# Patient Record
Sex: Female | Born: 1970
Health system: Southern US, Community
[De-identification: ages and names within clinical notes are randomized; demographics above are authoritative.]

## PROBLEM LIST (undated history)

## (undated) DIAGNOSIS — Z973 Presence of spectacles and contact lenses: Secondary | ICD-10-CM

## (undated) DIAGNOSIS — F329 Major depressive disorder, single episode, unspecified: Secondary | ICD-10-CM

## (undated) DIAGNOSIS — R7303 Prediabetes: Secondary | ICD-10-CM

## (undated) DIAGNOSIS — Q909 Down syndrome, unspecified: Secondary | ICD-10-CM

## (undated) DIAGNOSIS — R569 Unspecified convulsions: Secondary | ICD-10-CM

## (undated) DIAGNOSIS — L309 Dermatitis, unspecified: Secondary | ICD-10-CM

## (undated) DIAGNOSIS — E039 Hypothyroidism, unspecified: Secondary | ICD-10-CM

## (undated) DIAGNOSIS — I509 Heart failure, unspecified: Secondary | ICD-10-CM

## (undated) DIAGNOSIS — J869 Pyothorax without fistula: Secondary | ICD-10-CM

## (undated) DIAGNOSIS — M1A0690 Idiopathic chronic gout, unspecified knee, without tophus (tophi): Secondary | ICD-10-CM

## (undated) DIAGNOSIS — F0280 Dementia in other diseases classified elsewhere without behavioral disturbance: Secondary | ICD-10-CM

## (undated) DIAGNOSIS — F429 Obsessive-compulsive disorder, unspecified: Secondary | ICD-10-CM

## (undated) DIAGNOSIS — M1990 Unspecified osteoarthritis, unspecified site: Secondary | ICD-10-CM

## (undated) DIAGNOSIS — J189 Pneumonia, unspecified organism: Secondary | ICD-10-CM

## (undated) DIAGNOSIS — T148XXA Other injury of unspecified body region, initial encounter: Secondary | ICD-10-CM

## (undated) DIAGNOSIS — I8393 Asymptomatic varicose veins of bilateral lower extremities: Secondary | ICD-10-CM

## (undated) DIAGNOSIS — F0390 Unspecified dementia without behavioral disturbance: Secondary | ICD-10-CM

## (undated) DIAGNOSIS — H353190 Nonexudative age-related macular degeneration, unspecified eye, stage unspecified: Secondary | ICD-10-CM

## (undated) DIAGNOSIS — E119 Type 2 diabetes mellitus without complications: Secondary | ICD-10-CM

## (undated) DIAGNOSIS — M179 Osteoarthritis of knee, unspecified: Secondary | ICD-10-CM

## (undated) DIAGNOSIS — R600 Localized edema: Secondary | ICD-10-CM

## (undated) DIAGNOSIS — G4736 Sleep related hypoventilation in conditions classified elsewhere: Secondary | ICD-10-CM

## (undated) DIAGNOSIS — F32A Depression, unspecified: Secondary | ICD-10-CM

## (undated) DIAGNOSIS — N8500 Endometrial hyperplasia, unspecified: Secondary | ICD-10-CM

## (undated) DIAGNOSIS — G4733 Obstructive sleep apnea (adult) (pediatric): Secondary | ICD-10-CM

## (undated) DIAGNOSIS — Z9981 Dependence on supplemental oxygen: Secondary | ICD-10-CM

## (undated) DIAGNOSIS — Z8701 Personal history of pneumonia (recurrent): Secondary | ICD-10-CM

## (undated) DIAGNOSIS — M1710 Unilateral primary osteoarthritis, unspecified knee: Secondary | ICD-10-CM

## (undated) DIAGNOSIS — Z9989 Dependence on other enabling machines and devices: Secondary | ICD-10-CM

## (undated) HISTORY — DX: Sleep related hypoventilation in conditions classified elsewhere: G47.36

## (undated) HISTORY — PX: BACK SURGERY: SHX140

## (undated) HISTORY — DX: Osteoarthritis of knee, unspecified: M17.9

## (undated) HISTORY — DX: Hypothyroidism, unspecified: E03.9

## (undated) HISTORY — DX: Dermatitis, unspecified: L30.9

## (undated) HISTORY — PX: LUMBAR DISC SURGERY: SHX700

## (undated) HISTORY — DX: Nonexudative age-related macular degeneration, unspecified eye, stage unspecified: H35.3190

## (undated) HISTORY — PX: ANTERIOR CRUCIATE LIGAMENT REPAIR: SHX115

## (undated) HISTORY — DX: Dependence on other enabling machines and devices: Z99.89

## (undated) HISTORY — DX: Unilateral primary osteoarthritis, unspecified knee: M17.10

## (undated) HISTORY — DX: Dementia in other diseases classified elsewhere, unspecified severity, without behavioral disturbance, psychotic disturbance, mood disturbance, and anxiety: F02.80

## (undated) HISTORY — DX: Obstructive sleep apnea (adult) (pediatric): G47.33

## (undated) HISTORY — DX: Unspecified convulsions: R56.9

## (undated) HISTORY — DX: Major depressive disorder, single episode, unspecified: F32.9

## (undated) HISTORY — DX: Heart failure, unspecified: I50.9

## (undated) HISTORY — DX: Unspecified dementia, unspecified severity, without behavioral disturbance, psychotic disturbance, mood disturbance, and anxiety: F03.90

## (undated) HISTORY — DX: Depression, unspecified: F32.A

---

## 2000-10-22 ENCOUNTER — Encounter: Payer: Self-pay | Admitting: Emergency Medicine

## 2000-10-22 ENCOUNTER — Emergency Department (HOSPITAL_COMMUNITY): Admission: EM | Admit: 2000-10-22 | Discharge: 2000-10-22 | Payer: Self-pay | Admitting: Emergency Medicine

## 2000-11-09 ENCOUNTER — Encounter: Admission: RE | Admit: 2000-11-09 | Discharge: 2000-12-03 | Payer: Self-pay | Admitting: Family Medicine

## 2000-12-07 ENCOUNTER — Encounter: Payer: Self-pay | Admitting: Family Medicine

## 2000-12-07 ENCOUNTER — Encounter: Admission: RE | Admit: 2000-12-07 | Discharge: 2000-12-07 | Payer: Self-pay | Admitting: Family Medicine

## 2001-07-04 ENCOUNTER — Encounter: Admission: RE | Admit: 2001-07-04 | Discharge: 2001-07-04 | Payer: Self-pay | Admitting: Family Medicine

## 2001-07-04 ENCOUNTER — Encounter: Payer: Self-pay | Admitting: Family Medicine

## 2001-11-28 ENCOUNTER — Encounter
Admission: RE | Admit: 2001-11-28 | Discharge: 2001-12-06 | Payer: Self-pay | Admitting: Physical Medicine & Rehabilitation

## 2004-06-01 ENCOUNTER — Encounter: Admission: RE | Admit: 2004-06-01 | Discharge: 2004-06-01 | Payer: Self-pay | Admitting: Neurosurgery

## 2004-07-18 ENCOUNTER — Ambulatory Visit (HOSPITAL_COMMUNITY): Admission: RE | Admit: 2004-07-18 | Discharge: 2004-07-19 | Payer: Self-pay | Admitting: Neurosurgery

## 2004-07-18 HISTORY — PX: LUMBAR DISC SURGERY: SHX700

## 2005-05-09 ENCOUNTER — Encounter: Admission: RE | Admit: 2005-05-09 | Discharge: 2005-05-09 | Payer: Self-pay | Admitting: Neurosurgery

## 2005-06-14 ENCOUNTER — Encounter: Admission: RE | Admit: 2005-06-14 | Discharge: 2005-06-14 | Payer: Self-pay | Admitting: Neurology

## 2005-07-04 ENCOUNTER — Emergency Department (HOSPITAL_COMMUNITY): Admission: EM | Admit: 2005-07-04 | Discharge: 2005-07-05 | Payer: Self-pay | Admitting: Emergency Medicine

## 2008-03-16 ENCOUNTER — Observation Stay (HOSPITAL_COMMUNITY): Admission: AD | Admit: 2008-03-16 | Discharge: 2008-03-17 | Payer: Self-pay | Admitting: Family Medicine

## 2008-03-16 ENCOUNTER — Ambulatory Visit: Payer: Self-pay | Admitting: Family Medicine

## 2009-01-05 ENCOUNTER — Encounter: Admission: RE | Admit: 2009-01-05 | Discharge: 2009-01-05 | Payer: Self-pay | Admitting: Neurology

## 2010-01-13 ENCOUNTER — Emergency Department (HOSPITAL_COMMUNITY)
Admission: EM | Admit: 2010-01-13 | Discharge: 2010-01-13 | Payer: Self-pay | Source: Home / Self Care | Admitting: Emergency Medicine

## 2010-05-10 LAB — URINALYSIS, ROUTINE W REFLEX MICROSCOPIC
Glucose, UA: NEGATIVE mg/dL
Protein, ur: NEGATIVE mg/dL
pH: 6 (ref 5.0–8.0)

## 2010-05-10 LAB — DIFFERENTIAL
Eosinophils Absolute: 0.1 10*3/uL (ref 0.0–0.7)
Eosinophils Relative: 1 % (ref 0–5)
Lymphocytes Relative: 16 % (ref 12–46)

## 2010-05-10 LAB — COMPREHENSIVE METABOLIC PANEL
ALT: 24 U/L (ref 0–35)
Alkaline Phosphatase: 50 U/L (ref 39–117)
Chloride: 103 mEq/L (ref 96–112)
GFR calc Af Amer: >60 mL/min (ref >60)
Glucose, Bld: 106 mg/dL — ABNORMAL HIGH (ref 70–99)
Potassium: 4.1 mEq/L (ref 3.5–5.1)
Sodium: 138 mEq/L (ref 135–145)

## 2010-05-10 LAB — CBC
MCH: 35.4 pg — ABNORMAL HIGH (ref 26.0–34.0)
MCV: 103.1 fL — ABNORMAL HIGH (ref 78.0–100.0)
Platelets: 222 10*3/uL (ref 150–400)
RBC: 4.24 MIL/uL (ref 3.87–5.11)

## 2010-06-13 LAB — COMPREHENSIVE METABOLIC PANEL
AST: 32 U/L (ref 0–37)
Alkaline Phosphatase: 53 U/L (ref 39–117)
Calcium: 8.4 mg/dL (ref 8.4–10.5)
GFR calc Af Amer: >60 mL/min (ref >60)
GFR calc non Af Amer: 60 mL/min — ABNORMAL LOW (ref >60)
Glucose, Bld: 145 mg/dL — ABNORMAL HIGH (ref 70–99)
Potassium: 3.6 mEq/L (ref 3.5–5.1)
Total Bilirubin: 0.6 mg/dL (ref 0.3–1.2)
Total Protein: 7.3 g/dL (ref 6.0–8.3)

## 2010-06-13 LAB — DIFFERENTIAL
Basophils Absolute: 0 10*3/uL (ref 0.0–0.1)
Eosinophils Relative: 0 % (ref 0–5)
Lymphocytes Relative: 10 % — ABNORMAL LOW (ref 12–46)
Lymphs Abs: 0.5 10*3/uL — ABNORMAL LOW (ref 0.7–4.0)
Monocytes Relative: 2 % — ABNORMAL LOW (ref 3–12)
Neutro Abs: 4.9 10*3/uL (ref 1.7–7.7)
Neutrophils Relative %: 89 % — ABNORMAL HIGH (ref 43–77)

## 2010-06-13 LAB — CBC
HCT: 37.9 % (ref 36.0–46.0)
HCT: 41.6 % (ref 36.0–46.0)
Hemoglobin: 13.6 g/dL (ref 12.0–15.0)
MCHC: 33 g/dL (ref 30.0–36.0)
Platelets: 199 10*3/uL (ref 150–400)
RBC: 3.7 MIL/uL — ABNORMAL LOW (ref 3.87–5.11)
WBC: 5.4 10*3/uL (ref 4.0–10.5)

## 2010-06-13 LAB — D-DIMER, QUANTITATIVE: D-Dimer, Quant: 0.53 ug/mL-FEU — ABNORMAL HIGH (ref 0.00–0.48)

## 2010-06-13 LAB — BRAIN NATRIURETIC PEPTIDE: Pro B Natriuretic peptide (BNP): 26 pg/mL (ref 0.0–100.0)

## 2010-06-13 LAB — BASIC METABOLIC PANEL
BUN: 10 mg/dL (ref 6–23)
Chloride: 105 mEq/L (ref 96–112)
Potassium: 3.6 mEq/L (ref 3.5–5.1)

## 2010-07-12 NOTE — Discharge Summary (Signed)
NAMECHARIZMA, [name]                 ACCOUNT NO.:  [id number]   MEDICAL RECORD NO.:  [id number]          PATIENT TYPE:  OBV   LOCATION:  3703                         FACILITY:  [facility]   PHYSICIAN:  Santiago Bumpers. Hensel, M.D.DATE OF BIRTH:  10/19/1970   DATE OF ADMISSION:  03/16/2008  DATE OF DISCHARGE:  03/17/2008                               DISCHARGE SUMMARY   DISCHARGE DIAGNOSES:  1. Bronchitis with wheezing.  2. Hyponatremia.  3. Obsessive-compulsive disorder.  4. Hypothyroidism.  5. Trisomy 21.   DISCHARGE MEDICATIONS:  1. Synthroid 125 mcg daily.  2. Prozac 20 mg 1 tablet p.o. 3 times daily.  3. Prednisone 60 mg 1 tablet daily x3 more days, the last dose should      be on March 20, 2008.  4. Albuterol 2 puffs every 4 hours as needed for shortness of breath      or may also use nebulized albuterol q.4 h. p.r.n. shortness of      breath.  5. Azithromycin 250 mg 1 tablet daily for 3 more days, the last dose      should be on March 20, 2008.   FOLLOWUP APPOINTMENTS:  The patient has an appointment with Dr. Purnell Shoemaker.  Doctor's phone number is [phone].  The appointment date is Monday,  March 23, 2008, at 11 o'clock a.m.   LABORATORY DATA:  Done in the hospital on admission, the patient had a  normal white blood cell count of 5.6.  Her hemoglobin was 13.6.  Her  hematocrit was 41.6.  She did have an elevated MCV of 102.9.  Her  neutrophil percentage was 89%.  Her D-dimer was 0.53 and mildly  elevated.  She also had a complete metabolic panel on admission that was  significant for a low sodium of 125, low chloride of 95, glucose of 145  that was after she ate and a creatinine of 1.04.  Her LFTs were within  normal limits.  Her albumin was slightly low at 3.4.  Her BNP was 26.  On day of discharge, her CBC showed a normal white blood cell count  again at 5.4, hemoglobin was 12.5.  Her hematocrit 37.9.  Her platelets  were 199.  On Sunday at discharge, the patient's sodium  corrected to  138.  Her potassium was 3.6.  Her chloride was 105.  Her bicarb was 25.  Her glucose was 101.  Her BUN was 10, and her creatinine 0.89.  Her  calcium was 8.3.   OTHER STUDIES:  The patient had in the hospital a chest x-ray on March 16, 2008, showed streaky bibasilar opacities, likely representing  subsegmental atelectasis and less likely infiltrates.  Also on the chest  x-ray, there was mild peribronchial cuffing that could represent  bronchitis or bronchiolitis.  A chest CT done on March 17, 2008,  showed no evidence of pulmonary embolism, a few minimally prominent  nonspecific right paratracheal nodes, minimal bilateral lower lobe  atelectasis, cannot exclude minimal ground-glass infiltrates in the  lower lungs, nonspecific finding.   REASON FOR ADMISSION:  The patient was admitted from [place]  [facility]  as a direct admission due to increased shortness of breath and an oxygen  saturation that was found to be lower on the day of admission than it  was prior to that on the day before that.  Please see history and  physical for her whole admission H&P.  The patient was thought to have  pneumonia at the Urgent [facility] Center and was given a shot of ceftriaxone  as well as started on steroids and azithromycin at that time.   HOSPITAL COURSE:  1. Shortness of breath/wheezing:  This was initially thought to be due      to pneumonia that was diagnosed at [facility] on March 15, 2008.  The patient had received 1 g of ceftriaxone at Urgent      Care and followed up the next day on March 16, 2008, and was felt      to be worsening, so she was directly admitted and placed in      observation at our hospital.  Upon admission, the patient was doing      really well.  She never required any oxygen.  She continued sat 95-      97% on room air during her entire stay.  She was put on albuterol      nebs q.4 h. as she does have Down syndrome and is unable to ask  for      the treatments well.  We also continued the patient on the po      prednisone that she was getting at home (60 mg daily).  She got her      first dose of prednisone on March 16, 2008, and we gave her      another dose on March 17, 2008.  She will continue this for a 5-      day course.  A D-dimer was checked and found to be slightly      elevated, so a CT angiogram was done but ruled out a PE.  CXR      showed viral like findings and likely bronchitis and no PNA.  We      also continued her on azithromycin that she was started on at the      urgent care center. She will continue to complete a 5-day course of      azithromycin to treat her bronchitis.  2. Hyponatremia:  The patient's sodium was initially low at 125 and      corrected with 1 liter bolus of normal saline, as well as a 120 mL      an hour of D5 normal saline during her stay.  She was also eating      and drinking well normally at the time of discharge.  Her last      sodium was 138 prior to discharge.  3. Obsessive-compulsive disorder:  The patient has been on Prozac 20      mg 3 times a day for a very long time according to her mother.  We      continued her on this medication.  She remained stable in regards      to this problem.  4. Hypothyroidism:  The patient was continued on her home dose of      Synthroid.   DISCHARGE CONDITION:  The patient is stable.  She is breathing well on  room air, not requiring oxygen.  Her lungs sounded much clearer than  they  did on admission.  She will go home with her father to her house  where she lives with her parents.  She is afebrile and did not have any  fevers here      Alanda Amass, M.D.  Electronically Signed      Santiago Bumpers. Leveda Anna, M.D.  Electronically Signed    JH/MEDQ  D:  03/17/2008  T:  03/18/2008  Job:  [id number]

## 2010-07-12 NOTE — H&P (Signed)
[name], [name]                 ACCOUNT NO.:  [id number]   MEDICAL RECORD NO.:  [id number]          PATIENT TYPE:  OBV   LOCATION:  3703                         FACILITY:  [facility]   PHYSICIAN:  Santiago Bumpers. Hensel, M.D.DATE OF BIRTH:  [birth year]   DATE OF ADMISSION:  03/16/2008  DATE OF DISCHARGE:                              HISTORY & PHYSICAL   PRIMARY CARE PHYSICIAN:  Lianne Bushy, MD, however, she was seen by Dr.  Cleta Alberts 2 times at [facility].   CHIEF COMPLAINT:  Shortness of breath.   HISTORY OF PRESENT ILLNESS:  The patient is a 40 year old female with  trisomy 88, relatively high functioning, who was here after being seen  at [facility] by Dr. Cleta Alberts today.  The patient was recovering  down with cold/cough last Thursday which was approximately 5 days ago,  she became more short of breath and was seen on March 15, 2008, which  was 2 days ago at [place].  At this time, she was sating 99% on room air.  She was diagnosed by chest x-ray with bilateral lower lobe pneumonia.  She was given 1 g of Rocephin at that time as well as prescription for  azithromycin 250 mg x5 days and prednisone 60 mg x2 days and 40 mg x4  days and then 20 mg x6 days.  The patient did take 60 mg of prednisone  today.  However, she is not started on her antibiotics yet.  She  received her ceftriaxone shot yesterday evening around 5 or 6 p.m.  The  patient was also started on albuterol nebs at the Urgent Care Center and  has a nebulizer machine as well as a neb solution at home.  She was  followed up the following day by [place] on March 16, 2008, which was  today around noon.  Her oxygen saturation was 90% and Dr. Cleta Alberts called [place]  to admit the patient to make sure nothing else was going causing her low  O2 saturation.  Currently, the patient is sating 97% on room air in the  hospital bed.  Mom says that she would like to send the patient home if  she is only needing albuterol nebs and not  needing oxygen.  According to  mom, the patient looks better than she did yesterday.  She is more  perky.  She has not had neb treatments since approximately 12 p.m. and  she feels like she would like to have one now.  The patient is also  complaining of some chest pain when she breathes and when she coughs,  but otherwise doing well.  The patient says that she did have some fever  before.   PAST MEDICAL HISTORY:  1. Hypothyroidism.  2. Depression versus obsessive-compulsive disorder.  The mom says that      this is more of obsessive-compulsive disorder.  No history of      cardiac problems.  No history of wheezing.  No history of an      echocardiogram in the past.   ALLERGIES:  No known drug allergies.  MEDICATIONS:  1. Synthroid 0.125 mcg daily.  2. Prozac 20 mg 3 times a day.   PAST SURGICAL HISTORY:  She has had a couple of knee surgeries that were  arthroscopic, also surgery on ruptured disk in her back in 2004.   FAMILY HISTORY:  Grandmother had diabetes.  Mom is healthy.  Dad has  hypertension and diabetes.   SOCIAL HISTORY:  She lives with mom, Burnard Leigh.  No history of alcohol  smoking or drugs.  She goes to adult day care during the day.   REVIEW OF SYSTEMS:  The patient is also complaining of diarrhea x2, but  mom says she did not have this.  Also, the patient denies dizziness.  She says she is hungry, tolerating p.o. liquids well, wants a diet.  Also complaining of some mild abdominal pain in the bottom of her  abdomen.   PHYSICAL EXAMINATION:  VITAL SIGNS:  Temperature 97.4, heart rate 85,  respiratory rate 18, blood pressure 103/63, and sating 97% on room air.  GENERAL:  The patient is alert, in no acute distress, smiling, talking  in complete sentences, at times interrupted by coughing, also Down  syndrome appearance.  HEENT:  Oropharynx, no exudates or erythema.  Extraocular muscles are  intact.  Pupils are equal, round, and reactive to light.  NECK:  No  lymphadenopathy.  CARDIOVASCULAR:  Heart, regular rate and rhythm.  No murmurs, rubs, or  gallops.  +2 radial pulses.  No JVD noted.  PULMONARY:  Expiratory crackles bilaterally, worse at the bases.  No  retractions.  No nasal flaring.  Normal respiratory rate.  ABDOMEN:  Soft, nondistended, and nontender.  Normoactive bowel sounds.  No hepatosplenomegaly.  EXTREMITIES: Very slight trace edema of the knee bilaterally.   Chest x-ray at [facility] showed bibasilar infiltrate on March 15, 2008,  and influenza was negative at Urgent Care   ASSESSMENT AND PLAN:  The patient is a 40 year old female with trisomy  21 and pneumonia, in very good condition.  1. Shortness of breath.  This is likely secondary to her pneumonia as      seen on chest x-ray at Urgent Care.  She is sating well on room air      at 97%, actually doing better than she was doing at [place]      according to Dr. Cleta Alberts described to me.  Also, she is doing better      per mom from yesterday.  Mom desires that she go home today if      doing well.  We will go ahead and repeat a chest x-ray, AP and      lateral.  We will also check a CBC and differential, CMET, BNP, and      D-dimer just to rule out any other possible causes, like PE.  Chest      pain is pleuritic and the patient has pneumonia on chest x-ray      previously diagnosed, so we will not cycle cardiac enzymes since it      seems likely have an explanation of this.  We will give another      albuterol neb now and continue q.4 h. p.r.n. nebs.  We will go      ahead and give her next dose of antibiotics, give her azithromycin      500 mg p.o. x1 now as it is now 24 hours after her ceftriaxone      shot.  If her labs are normal and  her chest x-ray just shows      pneumonia and she is sating well, we will discharge her home      tonight.  She will continue with her prednisone tomorrow since she      already got the dose today.  2. Obsessive-compulsive disorder.  We will  continue her Prozac at her      home dose of 3 times a day, 20 mg.  3. Hypothyroidism.  We will continue her Synthroid at her home dose.  4. Fluids, electrolytes, nutrition/gastrointestinal.  We will start      her on a regular diet.  5. Prophylaxis.  Heparin while in the hospital.      Alanda Amass, M.D.  Electronically Signed      Santiago Bumpers. Leveda Anna, M.D.  Electronically Signed    JH/MEDQ  D:  03/16/2008  T:  03/17/2008  Job:  [id number]
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
## 2010-12-26 ENCOUNTER — Emergency Department (HOSPITAL_COMMUNITY): Payer: Medicare Other

## 2010-12-26 ENCOUNTER — Emergency Department (HOSPITAL_COMMUNITY)
Admission: EM | Admit: 2010-12-26 | Discharge: 2010-12-27 | Disposition: A | Discharge status: Home / Self Care | Payer: Medicare Other | Attending: Emergency Medicine | Admitting: Emergency Medicine

## 2010-12-26 DIAGNOSIS — Y9239 Other specified sports and athletic area as the place of occurrence of the external cause: Secondary | ICD-10-CM | POA: Insufficient documentation

## 2010-12-26 DIAGNOSIS — Y9354 Activity, bowling: Secondary | ICD-10-CM | POA: Insufficient documentation

## 2010-12-26 DIAGNOSIS — M79609 Pain in unspecified limb: Secondary | ICD-10-CM | POA: Insufficient documentation

## 2010-12-26 DIAGNOSIS — F068 Other specified mental disorders due to known physiological condition: Secondary | ICD-10-CM | POA: Insufficient documentation

## 2010-12-26 DIAGNOSIS — Q909 Down syndrome, unspecified: Secondary | ICD-10-CM | POA: Insufficient documentation

## 2010-12-26 DIAGNOSIS — M7989 Other specified soft tissue disorders: Secondary | ICD-10-CM | POA: Insufficient documentation

## 2010-12-26 DIAGNOSIS — Z79899 Other long term (current) drug therapy: Secondary | ICD-10-CM | POA: Insufficient documentation

## 2010-12-26 DIAGNOSIS — W230XXA Caught, crushed, jammed, or pinched between moving objects, initial encounter: Secondary | ICD-10-CM | POA: Insufficient documentation

## 2010-12-26 DIAGNOSIS — S6720XA Crushing injury of unspecified hand, initial encounter: Secondary | ICD-10-CM | POA: Insufficient documentation

## 2011-02-06 ENCOUNTER — Encounter (INDEPENDENT_AMBULATORY_CARE_PROVIDER_SITE_OTHER): Payer: Medicare Other | Admitting: Ophthalmology

## 2011-02-06 DIAGNOSIS — H43819 Vitreous degeneration, unspecified eye: Secondary | ICD-10-CM

## 2011-02-06 DIAGNOSIS — H3090 Unspecified chorioretinal inflammation, unspecified eye: Secondary | ICD-10-CM

## 2011-02-06 DIAGNOSIS — H2510 Age-related nuclear cataract, unspecified eye: Secondary | ICD-10-CM

## 2011-02-28 DIAGNOSIS — G40909 Epilepsy, unspecified, not intractable, without status epilepticus: Secondary | ICD-10-CM

## 2011-02-28 DIAGNOSIS — R569 Unspecified convulsions: Secondary | ICD-10-CM

## 2011-02-28 HISTORY — DX: Epilepsy, unspecified, not intractable, without status epilepticus: G40.909

## 2011-02-28 HISTORY — DX: Unspecified convulsions: R56.9

## 2011-03-07 ENCOUNTER — Encounter (INDEPENDENT_AMBULATORY_CARE_PROVIDER_SITE_OTHER): Payer: Medicare Other | Admitting: Ophthalmology

## 2011-03-07 DIAGNOSIS — H43819 Vitreous degeneration, unspecified eye: Secondary | ICD-10-CM

## 2011-03-07 DIAGNOSIS — H3090 Unspecified chorioretinal inflammation, unspecified eye: Secondary | ICD-10-CM

## 2011-03-07 DIAGNOSIS — H35359 Cystoid macular degeneration, unspecified eye: Secondary | ICD-10-CM

## 2011-04-07 ENCOUNTER — Encounter (INDEPENDENT_AMBULATORY_CARE_PROVIDER_SITE_OTHER): Payer: Medicare Other | Admitting: Ophthalmology

## 2011-04-07 DIAGNOSIS — H3020 Posterior cyclitis, unspecified eye: Secondary | ICD-10-CM

## 2011-04-07 DIAGNOSIS — H35379 Puckering of macula, unspecified eye: Secondary | ICD-10-CM

## 2011-04-07 DIAGNOSIS — H35359 Cystoid macular degeneration, unspecified eye: Secondary | ICD-10-CM

## 2011-04-07 DIAGNOSIS — H2510 Age-related nuclear cataract, unspecified eye: Secondary | ICD-10-CM

## 2011-04-07 DIAGNOSIS — H43819 Vitreous degeneration, unspecified eye: Secondary | ICD-10-CM

## 2011-05-08 ENCOUNTER — Ambulatory Visit (INDEPENDENT_AMBULATORY_CARE_PROVIDER_SITE_OTHER): Payer: Medicare Other | Admitting: Ophthalmology

## 2011-05-29 ENCOUNTER — Emergency Department (HOSPITAL_COMMUNITY): Payer: Medicare Other

## 2011-05-29 ENCOUNTER — Encounter (HOSPITAL_COMMUNITY): Payer: Self-pay | Admitting: Emergency Medicine

## 2011-05-29 ENCOUNTER — Emergency Department (HOSPITAL_COMMUNITY)
Admission: EM | Admit: 2011-05-29 | Discharge: 2011-05-29 | Disposition: A | Discharge status: Home / Self Care | Payer: Medicare Other | Attending: Emergency Medicine | Admitting: Emergency Medicine

## 2011-05-29 DIAGNOSIS — M2540 Effusion, unspecified joint: Secondary | ICD-10-CM

## 2011-05-29 DIAGNOSIS — M25569 Pain in unspecified knee: Secondary | ICD-10-CM | POA: Insufficient documentation

## 2011-05-29 DIAGNOSIS — J45909 Unspecified asthma, uncomplicated: Secondary | ICD-10-CM | POA: Insufficient documentation

## 2011-05-29 DIAGNOSIS — M7989 Other specified soft tissue disorders: Secondary | ICD-10-CM | POA: Insufficient documentation

## 2011-05-29 MED ORDER — ACETAMINOPHEN 325 MG PO TABS
325.0000 mg | ORAL_TABLET | Freq: Once | ORAL | Status: AC
  Administered 2011-05-29: 325 mg via ORAL
  Filled 2011-05-29: qty 1

## 2011-05-29 MED ORDER — OXYCODONE-ACETAMINOPHEN 5-325 MG PO TABS
1.0000 | ORAL_TABLET | Freq: Once | ORAL | Status: AC
  Administered 2011-05-29: 1 via ORAL
  Filled 2011-05-29: qty 1

## 2011-05-29 NOTE — ED Notes (Signed)
Family member stated that pt has been having rt knee pain. Pt has chronic arthritis. Pt normally gets lubricant shots and they seem to work. However, they have not been working for the last couple of days. Pain is 7 out of 10. Pt unable to ambulate without pain. Will continue to monitor.

## 2011-05-29 NOTE — ED Notes (Signed)
Family stated they are not waiting for anyone and they are leaving now. EDP made aware. Pt and family made aware that they are leaving at their own risk.

## 2011-05-29 NOTE — Discharge Instructions (Signed)
Joint Injection Care After Refer to this sheet in the next few days. These instructions provide you with information on caring for yourself after you have had a joint injection. Your caregiver also may give you more specific instructions. Your treatment has been planned according to current medical practices, but problems sometimes occur. Call your caregiver if you have any problems or questions after your procedure. After any type of joint injection, it is not uncommon to experience:  Soreness, swelling, or bruising around the injection site.   Mild numbness, tingling, or weakness around the injection site caused by the numbing medicine used before or with the injection.  It also is possible to experience the following effects associated with the specific agent after injection:  Iodine-based contrast agents:   Allergic reaction (itching, hives, widespread redness, and swelling beyond the injection site).   Corticosteroids (These effects are rare.):   Allergic reaction.   Increased blood sugar levels (If you have diabetes and you notice that your blood sugar levels have increased, notify your caregiver).   Increased blood pressure levels.   Mood swings.   Hyaluronic acid in the use of viscosupplementation.   Temporary heat or redness.   Temporary rash and itching.   Increased fluid accumulation in the injected joint.  These effects all should resolve within a day after your procedure.  HOME CARE INSTRUCTIONS  Limit yourself to light activity the day of your procedure. Avoid lifting heavy objects, bending, stooping, or twisting.   Take prescription or over-the-counter pain medication as directed by your caregiver.   You may apply ice to your injection site to reduce pain and swelling the day of your procedure. Ice may be applied 3 to 4 times:   Put ice in a plastic bag.   Place a towel between your skin and the bag.   Leave the ice on for no longer than 15 to 20 minutes  each time.  SEEK IMMEDIATE MEDICAL CARE IF:   Pain and swelling get worse rather than better or extend beyond the injection site.   Numbness does not go away.   Blood or fluid continues to leak from the injection site.   You have chest pain.   You have swelling of your face or tongue.   You have trouble breathing or you become dizzy.   You develop a fever, chills, or severe tenderness at the injection site that last longer than 1 day.  MAKE SURE YOU:  Understand these instructions.   Watch your condition.   Get help right away if you are not doing well or if you get worse.  Document Released: 10/27/2010 Document Revised: 02/02/2011 Document Reviewed: 10/27/2010 [employer] Patient Information 2012 [employer]. 

## 2011-05-29 NOTE — ED Notes (Signed)
Ortho made aware of knee immobilizer.

## 2011-05-29 NOTE — ED Notes (Signed)
Pt left AMA °

## 2011-05-29 NOTE — ED Notes (Signed)
PT. REPORTS PERSISTENT RIGHT KNEE PAIN FOR 2 WEEKS DENIES INJURY OR FALL , STATES HISTORY OF ARTHRITIS.

## 2011-05-29 NOTE — ED Notes (Signed)
Pt and family refused knee immobilizer. NP made aware. Pt and family stated they were ready to leave now and disappointed because they were only able to see a NP ( and not Dr). Again, made NP aware.  Will continue to monitor

## 2011-05-29 NOTE — ED Provider Notes (Signed)
History     CSN: [id number]  Arrival date & time 05/29/11  1840   First MD Initiated Contact with Patient 05/29/11 2156      Chief Complaint  Patient presents with  . Knee Pain    (Consider location/radiation/quality/duration/timing/severity/associated sxs/prior treatment) Patient is a 41 y.o. female presenting with knee pain. The history is provided by the patient and a parent. No language interpreter was used.  Knee Pain This is a chronic problem. The current episode started 1 to 4 weeks ago. The problem occurs daily. The problem has been gradually worsening. Associated symptoms include joint swelling. Pertinent negatives include no chest pain, chills, fever, nausea, numbness, vomiting or weakness. The symptoms are aggravated by walking and bending. She has tried NSAIDs for the symptoms. The treatment provided mild relief.  Chronic R knee pain that has gotten worse over the past 2 weeks.    Past Medical History  Diagnosis Date  . Asthma     Past Surgical History  Procedure Date  . Back surgery   . Anterior cruciate ligament repair     No family history on file.  History  Substance Use Topics  . Smoking status: Never Smoker   . Smokeless tobacco: Not on file  . Alcohol Use: No    OB History    Grav Para Term Preterm Abortions TAB SAB Ect Mult Living                  Review of Systems  Constitutional: Negative.  Negative for fever and chills.  Respiratory: Negative for shortness of breath.   Cardiovascular: Negative for chest pain.  Gastrointestinal: Negative for nausea and vomiting.  Musculoskeletal: Positive for joint swelling.  Skin: Negative.   Neurological: Negative for dizziness, weakness and numbness.  Psychiatric/Behavioral: Negative.     Allergies  Review of patient's allergies indicates no known allergies.  Home Medications   Current Outpatient Rx  Name Route Sig Dispense Refill  . BUSPIRONE HCL 15 MG PO TABS Oral Take 15 mg by mouth 2 (two)  times daily.    Marland Kitchen FLUOXETINE HCL 20 MG PO CAPS Oral Take 20 mg by mouth 3 (three) times daily.    . IBUPROFEN 800 MG PO TABS Oral Take 800 mg by mouth every 8 (eight) hours as needed. For pain.    Marland Kitchen LEVOTHYROXINE SODIUM 112 MCG PO TABS Oral Take 112 mcg by mouth daily.    Marland Kitchen MEMANTINE HCL 10 MG PO TABS Oral Take 10 mg by mouth 2 (two) times daily.      BP 121/67  Pulse 85  Temp(Src) 97.9 F (36.6 C) (Oral)  Resp 18  SpO2 99%  LMP 05/14/2011  Physical Exam  Nursing note and vitals reviewed. Constitutional: She is oriented to person, place, and time. She appears well-developed and well-nourished.  HENT:  Head: Normocephalic and atraumatic.  Eyes: Conjunctivae and EOM are normal. Pupils are equal, round, and reactive to light.  Neck: Normal range of motion. Neck supple.  Cardiovascular: Normal rate.   Pulmonary/Chest: Effort normal.  Abdominal: Soft.  Musculoskeletal: She exhibits edema and tenderness.       R knee.  2+ R pedal pulse. Good movement and sensation to RLE  Neurological: She is alert and oriented to person, place, and time. She has normal reflexes.  Skin: Skin is warm and dry.  Psychiatric: She has a normal mood and affect.    ED Course  Procedures (including critical care time)  Labs Reviewed - No data  to display Dg Knee Complete 4 Views Right  05/29/2011  *RADIOLOGY REPORT*  Clinical Data: Pain  RIGHT KNEE - COMPLETE 4+ VIEW  Comparison: None.  Findings: There is a joint effusion.  There is chondrocalcinosis affecting the meniscal cartilage.  Joint spaces appear normal. There are small marginal osteophytes.  The patella appears somewhat laterally subluxed.  No acute or traumatic finding.  IMPRESSION: Joint effusion.  Chondrocalcinosis of the menisci.  Small marginal osteophytes.  Lateral subluxation of the patella, presumably chronic.  Original Report Authenticated By: Thomasenia Sales, M.D.     No diagnosis found.    MDM  R knee effusion and chronic laterally  subluxed patella.  Refused knee immobilizer or rx for pain meds.  Left without dc papers.  Percocet and tylenol for pain in the er.  Will follow up with [place] orth[facility] this week.         Jethro Bastos, NP 05/30/11 1729

## 2011-05-29 NOTE — Progress Notes (Signed)
Orthopedic Tech Progress Note Patient Details:  [name] 12-16-1970 [id number] Patient refused knee immobilizer. Patient ID: [name], female   DOB: [birth year], 41 y.o.   MRN: [id number]   [name] 05/29/2011, 10:20 PM

## 2011-05-31 NOTE — ED Provider Notes (Signed)
Medical screening examination/treatment/procedure(s) were performed by non-physician practitioner and as supervising physician I was immediately available for consultation/collaboration.   Shelda Jakes, MD 05/31/11 [phone]

## 2011-06-28 ENCOUNTER — Encounter (INDEPENDENT_AMBULATORY_CARE_PROVIDER_SITE_OTHER): Payer: Medicare Other | Admitting: Ophthalmology

## 2011-06-28 DIAGNOSIS — H43819 Vitreous degeneration, unspecified eye: Secondary | ICD-10-CM

## 2011-06-28 DIAGNOSIS — H2510 Age-related nuclear cataract, unspecified eye: Secondary | ICD-10-CM

## 2011-06-28 DIAGNOSIS — H35359 Cystoid macular degeneration, unspecified eye: Secondary | ICD-10-CM

## 2011-07-10 ENCOUNTER — Encounter (INDEPENDENT_AMBULATORY_CARE_PROVIDER_SITE_OTHER): Payer: Medicare Other | Admitting: Ophthalmology

## 2011-09-24 ENCOUNTER — Inpatient Hospital Stay (HOSPITAL_COMMUNITY)
Admission: EM | Admit: 2011-09-24 | Discharge: 2011-10-12 | DRG: 003 | Disposition: A | Discharge status: Other Acute Inpatient Hospital | Payer: Medicare Other | Attending: Internal Medicine | Admitting: Internal Medicine

## 2011-09-24 ENCOUNTER — Encounter (HOSPITAL_COMMUNITY): Payer: Self-pay | Admitting: Certified Registered"

## 2011-09-24 ENCOUNTER — Inpatient Hospital Stay (HOSPITAL_COMMUNITY): Payer: Medicare Other | Admitting: Certified Registered"

## 2011-09-24 ENCOUNTER — Emergency Department (HOSPITAL_COMMUNITY): Payer: Medicare Other

## 2011-09-24 ENCOUNTER — Encounter (HOSPITAL_COMMUNITY)
Admission: EM | Disposition: A | Discharge status: Nursing Home (Includes ICF) | Payer: Self-pay | Source: Home / Self Care | Attending: Internal Medicine

## 2011-09-24 ENCOUNTER — Encounter (HOSPITAL_COMMUNITY): Payer: Self-pay | Admitting: Thoracic Surgery (Cardiothoracic Vascular Surgery)

## 2011-09-24 ENCOUNTER — Encounter (HOSPITAL_COMMUNITY): Payer: Self-pay | Admitting: *Deleted

## 2011-09-24 ENCOUNTER — Inpatient Hospital Stay (HOSPITAL_COMMUNITY): Payer: Medicare Other

## 2011-09-24 DIAGNOSIS — G4733 Obstructive sleep apnea (adult) (pediatric): Secondary | ICD-10-CM | POA: Diagnosis present

## 2011-09-24 DIAGNOSIS — R7402 Elevation of levels of lactic acid dehydrogenase (LDH): Secondary | ICD-10-CM | POA: Diagnosis present

## 2011-09-24 DIAGNOSIS — R739 Hyperglycemia, unspecified: Secondary | ICD-10-CM

## 2011-09-24 DIAGNOSIS — R000 Tachycardia, unspecified: Secondary | ICD-10-CM

## 2011-09-24 DIAGNOSIS — Z8709 Personal history of other diseases of the respiratory system: Secondary | ICD-10-CM

## 2011-09-24 DIAGNOSIS — Z6841 Body Mass Index (BMI) 40.0 and over, adult: Secondary | ICD-10-CM

## 2011-09-24 DIAGNOSIS — F0280 Dementia in other diseases classified elsewhere without behavioral disturbance: Secondary | ICD-10-CM | POA: Diagnosis present

## 2011-09-24 DIAGNOSIS — J869 Pyothorax without fistula: Principal | ICD-10-CM | POA: Diagnosis present

## 2011-09-24 DIAGNOSIS — J9601 Acute respiratory failure with hypoxia: Secondary | ICD-10-CM

## 2011-09-24 DIAGNOSIS — Q909 Down syndrome, unspecified: Secondary | ICD-10-CM

## 2011-09-24 DIAGNOSIS — J90 Pleural effusion, not elsewhere classified: Secondary | ICD-10-CM

## 2011-09-24 DIAGNOSIS — J9600 Acute respiratory failure, unspecified whether with hypoxia or hypercapnia: Secondary | ICD-10-CM | POA: Diagnosis present

## 2011-09-24 DIAGNOSIS — J189 Pneumonia, unspecified organism: Secondary | ICD-10-CM | POA: Diagnosis present

## 2011-09-24 DIAGNOSIS — Z8701 Personal history of pneumonia (recurrent): Secondary | ICD-10-CM | POA: Diagnosis present

## 2011-09-24 DIAGNOSIS — I498 Other specified cardiac arrhythmias: Secondary | ICD-10-CM | POA: Diagnosis present

## 2011-09-24 DIAGNOSIS — R0602 Shortness of breath: Secondary | ICD-10-CM | POA: Diagnosis present

## 2011-09-24 DIAGNOSIS — E669 Obesity, unspecified: Secondary | ICD-10-CM | POA: Diagnosis present

## 2011-09-24 DIAGNOSIS — E871 Hypo-osmolality and hyponatremia: Secondary | ICD-10-CM | POA: Diagnosis present

## 2011-09-24 DIAGNOSIS — D72829 Elevated white blood cell count, unspecified: Secondary | ICD-10-CM

## 2011-09-24 DIAGNOSIS — D649 Anemia, unspecified: Secondary | ICD-10-CM | POA: Diagnosis present

## 2011-09-24 DIAGNOSIS — R7401 Elevation of levels of liver transaminase levels: Secondary | ICD-10-CM | POA: Diagnosis present

## 2011-09-24 HISTORY — PX: VIDEO ASSISTED THORACOSCOPY (VATS) /THOROCOTOMY/RADIOACTIVE SEED IMPLANT: SHX6167

## 2011-09-24 HISTORY — DX: Pyothorax without fistula: J86.9

## 2011-09-24 HISTORY — DX: Personal history of other diseases of the respiratory system: Z87.09

## 2011-09-24 HISTORY — DX: Pneumonia, unspecified organism: J18.9

## 2011-09-24 HISTORY — DX: Down syndrome, unspecified: Q90.9

## 2011-09-24 HISTORY — PX: FLEXIBLE BRONCHOSCOPY: SHX5094

## 2011-09-24 LAB — COMPREHENSIVE METABOLIC PANEL
ALT: 234 U/L — ABNORMAL HIGH (ref 0–35)
ALT: 189 U/L — ABNORMAL HIGH (ref 0–35)
Alkaline Phosphatase: 172 U/L — ABNORMAL HIGH (ref 39–117)
BUN: 15 mg/dL (ref 6–23)
CO2: 22 mEq/L (ref 19–32)
CO2: 25 mEq/L (ref 19–32)
Calcium: 8.3 mg/dL — ABNORMAL LOW (ref 8.4–10.5)
Creatinine, Ser: 0.84 mg/dL (ref 0.50–1.10)
GFR calc Af Amer: >90 mL/min (ref >90)
GFR calc Af Amer: >90 mL/min (ref >90)
GFR calc non Af Amer: 82 mL/min — ABNORMAL LOW (ref >90)
GFR calc non Af Amer: 85 mL/min — ABNORMAL LOW (ref >90)
Glucose, Bld: 171 mg/dL — ABNORMAL HIGH (ref 70–99)
Glucose, Bld: 129 mg/dL — ABNORMAL HIGH (ref 70–99)
Potassium: 3.7 mEq/L (ref 3.5–5.1)
Sodium: 137 mEq/L (ref 135–145)
Sodium: 138 mEq/L (ref 135–145)
Total Bilirubin: 0.5 mg/dL (ref 0.3–1.2)
Total Protein: 6.8 g/dL (ref 6.0–8.3)

## 2011-09-24 LAB — URINE MICROSCOPIC-ADD ON

## 2011-09-24 LAB — ABO/RH: ABO/RH(D): A POS

## 2011-09-24 LAB — TYPE AND SCREEN: Antibody Screen: NEGATIVE

## 2011-09-24 LAB — CBC
HCT: 39.4 % (ref 36.0–46.0)
Hemoglobin: 13.2 g/dL (ref 12.0–15.0)
Hemoglobin: 12.9 g/dL (ref 12.0–15.0)
MCH: 33.9 pg (ref 26.0–34.0)
MCHC: 33.5 g/dL (ref 30.0–36.0)
MCV: 100.8 fL — ABNORMAL HIGH (ref 78.0–100.0)
MCV: 103.1 fL — ABNORMAL HIGH (ref 78.0–100.0)
RBC: 3.81 MIL/uL — ABNORMAL LOW (ref 3.87–5.11)
RDW: 14 % (ref 11.5–15.5)

## 2011-09-24 LAB — BODY FLUID CELL COUNT WITH DIFFERENTIAL
Eos, Fluid: 2 %
Lymphs, Fluid: 9 %
Neutrophil Count, Fluid: 89 % — ABNORMAL HIGH (ref 0–25)
Total Nucleated Cell Count, Fluid: UNDETERMINED cu mm (ref 0–1000)

## 2011-09-24 LAB — URINALYSIS, ROUTINE W REFLEX MICROSCOPIC
Ketones, ur: 15 mg/dL — AB
Nitrite: NEGATIVE
Protein, ur: 30 mg/dL — AB
Specific Gravity, Urine: 1.028 (ref 1.005–1.030)
Urobilinogen, UA: 1 mg/dL (ref 0.0–1.0)

## 2011-09-24 LAB — POCT I-STAT 3, ART BLOOD GAS (G3+)
Bicarbonate: 23.6 mEq/L (ref 20.0–24.0)
O2 Saturation: 93 %
Patient temperature: 95.2
TCO2: 25 mmol/L (ref 0–100)
pCO2 arterial: 41.2 mmHg (ref 35.0–45.0)

## 2011-09-24 LAB — LIPASE, BLOOD: Lipase: 12 U/L (ref 11–59)

## 2011-09-24 LAB — HEMOGLOBIN A1C: Hgb A1c MFr Bld: 5.9 % — ABNORMAL HIGH (ref <5.7)

## 2011-09-24 LAB — AMYLASE, BODY FLUID

## 2011-09-24 LAB — GLUCOSE, SEROUS FLUID: Glucose, Fluid: 62 mg/dL

## 2011-09-24 LAB — GLUCOSE, CAPILLARY: Glucose-Capillary: 130 mg/dL — ABNORMAL HIGH (ref 70–99)

## 2011-09-24 LAB — MRSA PCR SCREENING: MRSA by PCR: POSITIVE — AB

## 2011-09-24 LAB — CARDIAC PANEL(CRET KIN+CKTOT+MB+TROPI)
CK, MB: 1.5 ng/mL (ref 0.3–4.0)
Troponin I: <0.3 ng/mL (ref <0.30)

## 2011-09-24 LAB — PRO B NATRIURETIC PEPTIDE: Pro B Natriuretic peptide (BNP): 207.7 pg/mL — ABNORMAL HIGH (ref 0–125)

## 2011-09-24 LAB — LACTIC ACID, PLASMA: Lactic Acid, Venous: 1.2 mmol/L (ref 0.5–2.2)

## 2011-09-24 SURGERY — VIDEO ASSISTED THORACOSCOPY (VATS)/THOROCOTOMY
Anesthesia: General | Site: Chest | Wound class: Contaminated

## 2011-09-24 MED ORDER — ACETAMINOPHEN 10 MG/ML IV SOLN
1000.0000 mg | Freq: Four times a day (QID) | INTRAVENOUS | Status: AC
  Administered 2011-09-24 – 2011-09-25 (×4): 1000 mg via INTRAVENOUS
  Filled 2011-09-24 (×4): qty 100

## 2011-09-24 MED ORDER — LEVOTHYROXINE SODIUM 112 MCG PO TABS
112.0000 ug | ORAL_TABLET | Freq: Every day | ORAL | Status: DC
  Filled 2011-09-24: qty 1

## 2011-09-24 MED ORDER — CHLORHEXIDINE GLUCONATE 0.12 % MT SOLN
OROMUCOSAL | Status: AC
  Administered 2011-09-24: 11:00:00
  Filled 2011-09-24: qty 15

## 2011-09-24 MED ORDER — ROCURONIUM BROMIDE 100 MG/10ML IV SOLN
INTRAVENOUS | Status: DC | PRN
  Administered 2011-09-24: 20 mg via INTRAVENOUS
  Administered 2011-09-24 (×2): 5 mg via INTRAVENOUS
  Administered 2011-09-24: 10 mg via INTRAVENOUS
  Administered 2011-09-24: 50 mg via INTRAVENOUS

## 2011-09-24 MED ORDER — PROPOFOL 10 MG/ML IV EMUL
INTRAVENOUS | Status: DC | PRN
  Administered 2011-09-24: 200 mg via INTRAVENOUS

## 2011-09-24 MED ORDER — DEXTROSE 5 % IV SOLN
INTRAVENOUS | Status: AC
  Filled 2011-09-24: qty 50

## 2011-09-24 MED ORDER — SODIUM CHLORIDE 0.9 % IR SOLN
Status: DC | PRN
  Administered 2011-09-24: 3000 mL

## 2011-09-24 MED ORDER — MIDAZOLAM HCL 5 MG/5ML IJ SOLN
INTRAMUSCULAR | Status: DC | PRN
  Administered 2011-09-24: 2 mg via INTRAVENOUS

## 2011-09-24 MED ORDER — ALBUTEROL SULFATE (5 MG/ML) 0.5% IN NEBU: 2.5000 mg | INHALATION_SOLUTION | RESPIRATORY_TRACT | Status: DC | PRN

## 2011-09-24 MED ORDER — WHITE PETROLATUM GEL
Status: AC
  Administered 2011-09-24: 1
  Filled 2011-09-24: qty 5

## 2011-09-24 MED ORDER — ALBUTEROL SULFATE (5 MG/ML) 0.5% IN NEBU
2.5000 mg | INHALATION_SOLUTION | Freq: Once | RESPIRATORY_TRACT | Status: AC
  Administered 2011-09-24: 2.5 mg via RESPIRATORY_TRACT
  Filled 2011-09-24: qty 0.5

## 2011-09-24 MED ORDER — SODIUM CHLORIDE 0.9 % IV SOLN
1.0000 mg/h | INTRAVENOUS | Status: DC
  Administered 2011-09-24: 1 mg/h via INTRAVENOUS
  Administered 2011-09-25: 3 mg/h via INTRAVENOUS
  Filled 2011-09-24 (×2): qty 10

## 2011-09-24 MED ORDER — HYDROCODONE-ACETAMINOPHEN 5-325 MG PO TABS
1.0000 | ORAL_TABLET | ORAL | Status: DC | PRN
  Administered 2011-09-24: 1 via ORAL
  Filled 2011-09-24: qty 1

## 2011-09-24 MED ORDER — AZITHROMYCIN 500 MG PO TABS
500.0000 mg | ORAL_TABLET | ORAL | Status: DC
  Administered 2011-09-24: 500 mg via ORAL
  Filled 2011-09-24: qty 2
  Filled 2011-09-24: qty 1

## 2011-09-24 MED ORDER — IPRATROPIUM BROMIDE 0.02 % IN SOLN: 0.5000 mg | Freq: Four times a day (QID) | RESPIRATORY_TRACT | Status: DC | PRN

## 2011-09-24 MED ORDER — LEVOTHYROXINE SODIUM 100 MCG IV SOLR
50.0000 ug | Freq: Every day | INTRAVENOUS | Status: DC
  Administered 2011-09-25 – 2011-10-02 (×8): 50 ug via INTRAVENOUS
  Filled 2011-09-24 (×10): qty 2.5

## 2011-09-24 MED ORDER — BIOTENE DRY MOUTH MT LIQD: 15.0000 mL | Freq: Two times a day (BID) | OROMUCOSAL | Status: DC

## 2011-09-24 MED ORDER — PANTOPRAZOLE SODIUM 40 MG IV SOLR
40.0000 mg | INTRAVENOUS | Status: DC
  Administered 2011-09-25 – 2011-09-30 (×6): 40 mg via INTRAVENOUS
  Filled 2011-09-24 (×7): qty 40

## 2011-09-24 MED ORDER — IOHEXOL 350 MG/ML SOLN
100.0000 mL | Freq: Once | INTRAVENOUS | Status: AC | PRN
  Administered 2011-09-24: 100 mL via INTRAVENOUS

## 2011-09-24 MED ORDER — VANCOMYCIN HCL IN DEXTROSE 1-5 GM/200ML-% IV SOLN
1000.0000 mg | Freq: Two times a day (BID) | INTRAVENOUS | Status: DC
  Administered 2011-09-25 – 2011-10-01 (×13): 1000 mg via INTRAVENOUS
  Filled 2011-09-24 (×14): qty 200

## 2011-09-24 MED ORDER — MEMANTINE HCL 10 MG PO TABS
10.0000 mg | ORAL_TABLET | Freq: Two times a day (BID) | ORAL | Status: DC
  Filled 2011-09-24 (×2): qty 1

## 2011-09-24 MED ORDER — VANCOMYCIN HCL IN DEXTROSE 1-5 GM/200ML-% IV SOLN
INTRAVENOUS | Status: AC
  Filled 2011-09-24: qty 400

## 2011-09-24 MED ORDER — ONDANSETRON HCL 4 MG/2ML IJ SOLN: 4.0000 mg | Freq: Four times a day (QID) | INTRAMUSCULAR | Status: DC | PRN

## 2011-09-24 MED ORDER — SODIUM CHLORIDE 0.9 % IV SOLN
INTRAVENOUS | Status: DC
  Administered 2011-09-24: 08:00:00 via INTRAVENOUS

## 2011-09-24 MED ORDER — BIOTENE DRY MOUTH MT LIQD
15.0000 mL | Freq: Four times a day (QID) | OROMUCOSAL | Status: DC
  Administered 2011-09-24 – 2011-10-12 (×71): 15 mL via OROMUCOSAL

## 2011-09-24 MED ORDER — SUCCINYLCHOLINE CHLORIDE 20 MG/ML IJ SOLN
INTRAMUSCULAR | Status: DC | PRN
  Administered 2011-09-24: 100 mg via INTRAVENOUS
  Administered 2011-09-24: 150 mg via INTRAVENOUS

## 2011-09-24 MED ORDER — SODIUM CHLORIDE 0.9 % IV BOLUS (SEPSIS)
1000.0000 mL | Freq: Once | INTRAVENOUS | Status: DC
  Administered 2011-09-24: 1000 mL via INTRAVENOUS

## 2011-09-24 MED ORDER — IPRATROPIUM BROMIDE 0.02 % IN SOLN
0.2500 mg | Freq: Once | RESPIRATORY_TRACT | Status: AC
  Administered 2011-09-24: 03:00:00 via RESPIRATORY_TRACT
  Filled 2011-09-24: qty 2.5

## 2011-09-24 MED ORDER — VANCOMYCIN HCL 1000 MG IV SOLR
1500.0000 mg | INTRAVENOUS | Status: AC
  Administered 2011-09-24: 1500 mg via INTRAVENOUS
  Filled 2011-09-24: qty 1500

## 2011-09-24 MED ORDER — BISACODYL 5 MG PO TBEC
10.0000 mg | DELAYED_RELEASE_TABLET | Freq: Every day | ORAL | Status: DC
  Administered 2011-10-06: 10 mg via ORAL
  Filled 2011-09-24: qty 1
  Filled 2011-09-24: qty 2

## 2011-09-24 MED ORDER — ALBUMIN HUMAN 5 % IV SOLN
INTRAVENOUS | Status: DC | PRN
  Administered 2011-09-24: 13:00:00 via INTRAVENOUS

## 2011-09-24 MED ORDER — SODIUM CHLORIDE 0.9 % IV SOLN
25.0000 ug/h | INTRAVENOUS | Status: DC
  Administered 2011-09-24: 25 ug/h via INTRAVENOUS
  Filled 2011-09-24: qty 50

## 2011-09-24 MED ORDER — BUSPIRONE HCL 15 MG PO TABS
15.0000 mg | ORAL_TABLET | Freq: Two times a day (BID) | ORAL | Status: DC
  Filled 2011-09-24 (×2): qty 1

## 2011-09-24 MED ORDER — IPRATROPIUM BROMIDE 0.02 % IN SOLN: 0.2500 mg | RESPIRATORY_TRACT | Status: DC | PRN

## 2011-09-24 MED ORDER — ALBUTEROL SULFATE (5 MG/ML) 0.5% IN NEBU: 2.5000 mg | INHALATION_SOLUTION | Freq: Four times a day (QID) | RESPIRATORY_TRACT | Status: DC | PRN

## 2011-09-24 MED ORDER — FENTANYL CITRATE 0.05 MG/ML IJ SOLN
INTRAMUSCULAR | Status: DC | PRN
  Administered 2011-09-24 (×2): 25 ug via INTRAVENOUS
  Administered 2011-09-24: 50 ug via INTRAVENOUS
  Administered 2011-09-24: 150 ug via INTRAVENOUS

## 2011-09-24 MED ORDER — NEOSTIGMINE METHYLSULFATE 1 MG/ML IJ SOLN
INTRAMUSCULAR | Status: DC | PRN
  Administered 2011-09-24: 5 mg via INTRAVENOUS

## 2011-09-24 MED ORDER — ONDANSETRON HCL 4 MG/2ML IJ SOLN
INTRAMUSCULAR | Status: DC | PRN
  Administered 2011-09-24: 4 mg via INTRAVENOUS

## 2011-09-24 MED ORDER — FLUOXETINE HCL 20 MG PO CAPS
20.0000 mg | ORAL_CAPSULE | Freq: Three times a day (TID) | ORAL | Status: DC
  Filled 2011-09-24 (×3): qty 1

## 2011-09-24 MED ORDER — GLYCOPYRROLATE 0.2 MG/ML IJ SOLN
INTRAMUSCULAR | Status: DC | PRN
  Administered 2011-09-24: .7 mg via INTRAVENOUS

## 2011-09-24 MED ORDER — IPRATROPIUM-ALBUTEROL 0.5-2.5 (3) MG/3ML IN SOLN: 3.0000 mL | Freq: Four times a day (QID) | RESPIRATORY_TRACT | Status: DC | PRN

## 2011-09-24 MED ORDER — POTASSIUM CHLORIDE 10 MEQ/50ML IV SOLN
10.0000 meq | Freq: Every day | INTRAVENOUS | Status: DC | PRN
  Filled 2011-09-24: qty 100

## 2011-09-24 MED ORDER — DEXTROSE-NACL 5-0.9 % IV SOLN
INTRAVENOUS | Status: DC
  Administered 2011-09-24: 100 mL/h via INTRAVENOUS
  Administered 2011-09-25 – 2011-09-26 (×2): via INTRAVENOUS

## 2011-09-24 MED ORDER — ONDANSETRON HCL 4 MG PO TABS: 4.0000 mg | ORAL_TABLET | Freq: Four times a day (QID) | ORAL | Status: DC | PRN

## 2011-09-24 MED ORDER — ENOXAPARIN SODIUM 30 MG/0.3ML ~~LOC~~ SOLN
30.0000 mg | SUBCUTANEOUS | Status: DC
  Filled 2011-09-24: qty 0.3

## 2011-09-24 MED ORDER — OXYCODONE HCL 5 MG PO TABS: 5.0000 mg | ORAL_TABLET | ORAL | Status: AC | PRN

## 2011-09-24 MED ORDER — INSULIN ASPART 100 UNIT/ML ~~LOC~~ SOLN
0.0000 [IU] | SUBCUTANEOUS | Status: DC
  Administered 2011-09-24: 2 [IU] via SUBCUTANEOUS
  Administered 2011-09-24: 8 [IU] via SUBCUTANEOUS
  Administered 2011-09-25 (×4): 2 [IU] via SUBCUTANEOUS
  Administered 2011-09-25: 4 [IU] via SUBCUTANEOUS
  Administered 2011-09-26: 2 [IU] via SUBCUTANEOUS
  Administered 2011-09-26: 4 [IU] via SUBCUTANEOUS
  Administered 2011-09-26 (×2): 2 [IU] via SUBCUTANEOUS
  Administered 2011-09-27: 4 [IU] via SUBCUTANEOUS
  Administered 2011-09-27: 8 [IU] via SUBCUTANEOUS
  Administered 2011-09-27 (×2): 2 [IU] via SUBCUTANEOUS
  Administered 2011-09-27 (×2): 4 [IU] via SUBCUTANEOUS
  Administered 2011-09-28: 2 [IU] via SUBCUTANEOUS
  Administered 2011-09-28: 4 [IU] via SUBCUTANEOUS
  Administered 2011-09-28 – 2011-10-02 (×17): 2 [IU] via SUBCUTANEOUS
  Administered 2011-10-03: 4 [IU] via SUBCUTANEOUS
  Administered 2011-10-03 – 2011-10-12 (×25): 2 [IU] via SUBCUTANEOUS

## 2011-09-24 MED ORDER — DEXTROSE 5 % IV SOLN
1.0000 g | INTRAVENOUS | Status: DC
  Administered 2011-09-24 – 2011-10-01 (×8): 1 g via INTRAVENOUS
  Filled 2011-09-24 (×8): qty 10

## 2011-09-24 MED ORDER — CEFUROXIME SODIUM 1.5 G IJ SOLR
INTRAMUSCULAR | Status: AC
  Filled 2011-09-24: qty 1.5

## 2011-09-24 MED ORDER — SENNOSIDES-DOCUSATE SODIUM 8.6-50 MG PO TABS
1.0000 | ORAL_TABLET | Freq: Every evening | ORAL | Status: DC | PRN
  Filled 2011-09-24: qty 1

## 2011-09-24 MED ORDER — PHENYLEPHRINE HCL 10 MG/ML IJ SOLN
INTRAMUSCULAR | Status: DC | PRN
  Administered 2011-09-24 (×3): 40 ug via INTRAVENOUS

## 2011-09-24 MED ORDER — LACTATED RINGERS IV SOLN
INTRAVENOUS | Status: DC | PRN
  Administered 2011-09-24: 13:00:00 via INTRAVENOUS

## 2011-09-24 MED ORDER — LEVOTHYROXINE SODIUM 100 MCG IV SOLR
50.0000 ug | Freq: Every day | INTRAVENOUS | Status: DC
  Filled 2011-09-24: qty 2.5

## 2011-09-24 MED ORDER — DEXTROSE 5 % IV SOLN: 500.0000 mg | INTRAVENOUS | Status: DC

## 2011-09-24 MED ORDER — FENTANYL CITRATE 0.05 MG/ML IJ SOLN
25.0000 ug | INTRAMUSCULAR | Status: DC | PRN
  Administered 2011-09-25 – 2011-09-26 (×4): 50 ug via INTRAVENOUS
  Filled 2011-09-24: qty 4
  Filled 2011-09-24 (×4): qty 2
  Filled 2011-09-24: qty 4

## 2011-09-24 MED ORDER — VANCOMYCIN HCL IN DEXTROSE 1-5 GM/200ML-% IV SOLN
1000.0000 mg | Freq: Two times a day (BID) | INTRAVENOUS | Status: AC
  Administered 2011-09-24: 1000 mg via INTRAVENOUS
  Filled 2011-09-24: qty 200

## 2011-09-24 MED ORDER — HYDROMORPHONE HCL PF 1 MG/ML IJ SOLN: 0.2500 mg | INTRAMUSCULAR | Status: DC | PRN

## 2011-09-24 MED ORDER — MUPIROCIN 2 % EX OINT
TOPICAL_OINTMENT | Freq: Two times a day (BID) | CUTANEOUS | Status: DC
  Administered 2011-09-24 – 2011-09-25 (×3): via NASAL
  Administered 2011-09-25: 1 via NASAL
  Administered 2011-09-26 – 2011-09-28 (×5): via NASAL
  Administered 2011-09-29: 1 via NASAL
  Administered 2011-09-29 – 2011-10-02 (×6): via NASAL
  Filled 2011-09-24 (×4): qty 22

## 2011-09-24 MED ORDER — CHLORHEXIDINE GLUCONATE CLOTH 2 % EX PADS
6.0000 | MEDICATED_PAD | Freq: Every day | CUTANEOUS | Status: AC
  Administered 2011-09-24 – 2011-09-28 (×5): 6 via TOPICAL

## 2011-09-24 MED ORDER — AZITHROMYCIN 500 MG PO TABS
500.0000 mg | ORAL_TABLET | Freq: Every day | ORAL | Status: DC
  Filled 2011-09-24: qty 1

## 2011-09-24 MED ORDER — ALBUTEROL SULFATE HFA 108 (90 BASE) MCG/ACT IN AERS
INHALATION_SPRAY | RESPIRATORY_TRACT | Status: DC | PRN
  Administered 2011-09-24: 2 via RESPIRATORY_TRACT

## 2011-09-24 MED ORDER — CHLORHEXIDINE GLUCONATE 0.12 % MT SOLN: 15.0000 mL | Freq: Two times a day (BID) | OROMUCOSAL | Status: DC

## 2011-09-24 MED ORDER — DEXTROSE 5 % IV SOLN: 1.0000 g | Freq: Once | INTRAVENOUS | Status: DC

## 2011-09-24 MED ORDER — MORPHINE SULFATE 4 MG/ML IJ SOLN
4.0000 mg | Freq: Once | INTRAMUSCULAR | Status: AC
  Administered 2011-09-24: 4 mg via INTRAVENOUS
  Filled 2011-09-24: qty 1

## 2011-09-24 MED ORDER — OXYCODONE-ACETAMINOPHEN 5-325 MG PO TABS: 1.0000 | ORAL_TABLET | ORAL | Status: DC | PRN

## 2011-09-24 MED ORDER — CHLORHEXIDINE GLUCONATE 0.12 % MT SOLN
15.0000 mL | Freq: Two times a day (BID) | OROMUCOSAL | Status: DC
  Administered 2011-09-24 – 2011-10-12 (×36): 15 mL via OROMUCOSAL
  Filled 2011-09-24 (×36): qty 15

## 2011-09-24 MED ORDER — 0.9 % SODIUM CHLORIDE (POUR BTL) OPTIME
TOPICAL | Status: DC | PRN
  Administered 2011-09-24: 300 mL

## 2011-09-24 MED ORDER — MUPIROCIN 2 % EX OINT: 1.0000 "application " | TOPICAL_OINTMENT | Freq: Two times a day (BID) | CUTANEOUS | Status: DC

## 2011-09-24 MED ORDER — TRAMADOL HCL 50 MG PO TABS: 50.0000 mg | ORAL_TABLET | Freq: Four times a day (QID) | ORAL | Status: DC | PRN

## 2011-09-24 SURGICAL SUPPLY — 64 items
APL SRG 22X2 LUM MLBL SLNT (VASCULAR PRODUCTS)
APPLICATOR TIP EXT COSEAL (VASCULAR PRODUCTS) IMPLANT
APPLIER CLIP ROT 10 11.4 M/L (STAPLE)
APR CLP MED LRG 11.4X10 (STAPLE)
CANISTER SUCTION 2500CC (MISCELLANEOUS) ×3 IMPLANT
CATH THORACIC 28FR (CATHETERS) IMPLANT
CATH THORACIC 36FR (CATHETERS) IMPLANT
CATH THORACIC 36FR RT ANG (CATHETERS) IMPLANT
CLIP APPLIE ROT 10 11.4 M/L (STAPLE) IMPLANT
CLIP TI MEDIUM 6 (CLIP) ×3 IMPLANT
CLOTH BEACON ORANGE TIMEOUT ST (SAFETY) ×3 IMPLANT
CONT SPEC 4OZ CLIKSEAL STRL BL (MISCELLANEOUS) ×6 IMPLANT
COVER SURGICAL LIGHT HANDLE (MISCELLANEOUS) ×6 IMPLANT
DRAPE LAPAROSCOPIC ABDOMINAL (DRAPES) ×3 IMPLANT
DRAPE SLUSH MACHINE 52X66 (DRAPES) ×3 IMPLANT
DRAPE SLUSH/WARMER DISC (DRAPES) ×3 IMPLANT
DRAPE WARM FLUID 44X44 (DRAPE) ×1 IMPLANT
DRILL BIT 7/64X5 (BIT) IMPLANT
DRSG COVADERM 4X6 (GAUZE/BANDAGES/DRESSINGS) ×1 IMPLANT
ELECT REM PT RETURN 9FT ADLT (ELECTROSURGICAL) ×3
ELECTRODE REM PT RTRN 9FT ADLT (ELECTROSURGICAL) ×2 IMPLANT
FLUID NSS /IRRIG 3000 ML XXX (IV SOLUTION) ×1 IMPLANT
GLOVE EUDERMIC 7 POWDERFREE (GLOVE) ×6 IMPLANT
GOWN PREVENTION PLUS XLARGE (GOWN DISPOSABLE) ×3 IMPLANT
GOWN STRL NON-REIN LRG LVL3 (GOWN DISPOSABLE) ×6 IMPLANT
KIT BASIN OR (CUSTOM PROCEDURE TRAY) ×3 IMPLANT
KIT ROOM TURNOVER OR (KITS) ×3 IMPLANT
NS IRRIG 1000ML POUR BTL (IV SOLUTION) ×6 IMPLANT
PACK CHEST (CUSTOM PROCEDURE TRAY) ×3 IMPLANT
PAD ARMBOARD 7.5X6 YLW CONV (MISCELLANEOUS) ×6 IMPLANT
SEALANT PROGEL (MISCELLANEOUS) IMPLANT
SEALANT SURG COSEAL 4ML (VASCULAR PRODUCTS) IMPLANT
SEALANT SURG COSEAL 8ML (VASCULAR PRODUCTS) IMPLANT
SET IRRIG TUBING LAPAROSCOPIC (IRRIGATION / IRRIGATOR) ×1 IMPLANT
SOLUTION ANTI FOG 6CC (MISCELLANEOUS) ×3 IMPLANT
SPONGE GAUZE 4X4 12PLY (GAUZE/BANDAGES/DRESSINGS) ×3 IMPLANT
SUT MNCRL AB 4-0 PS2 18 (SUTURE) ×1 IMPLANT
SUT PROLENE 3 0 SH DA (SUTURE) IMPLANT
SUT PROLENE 4 0 RB 1 (SUTURE)
SUT PROLENE 4-0 RB1 .5 CRCL 36 (SUTURE) IMPLANT
SUT SILK  1 MH (SUTURE) ×4
SUT SILK 1 MH (SUTURE) ×4 IMPLANT
SUT SILK 2 0SH CR/8 30 (SUTURE) IMPLANT
SUT VIC AB 1 CTX 18 (SUTURE) IMPLANT
SUT VIC AB 1 CTX 27 (SUTURE) ×1 IMPLANT
SUT VIC AB 1 CTX 36 (SUTURE)
SUT VIC AB 1 CTX36XBRD ANBCTR (SUTURE) IMPLANT
SUT VIC AB 2 TP1 27 (SUTURE) ×2 IMPLANT
SUT VIC AB 2-0 CT1 27 (SUTURE) ×3
SUT VIC AB 2-0 CT1 TAPERPNT 27 (SUTURE) IMPLANT
SUT VIC AB 2-0 CTX 36 (SUTURE) ×2 IMPLANT
SUT VIC AB 3-0 SH 8-18 (SUTURE) ×1 IMPLANT
SUT VIC AB 3-0 X1 27 (SUTURE) ×3 IMPLANT
SUT VICRYL 2 TP 1 (SUTURE) IMPLANT
SYSTEM SAHARA CHEST DRAIN RE-I (WOUND CARE) ×3 IMPLANT
TAPE CLOTH 4X10 WHT NS (GAUZE/BANDAGES/DRESSINGS) ×3 IMPLANT
TAPE CLOTH SURG 4X10 WHT LF (GAUZE/BANDAGES/DRESSINGS) ×1 IMPLANT
TIP APPLICATOR SPRAY EXTEND 16 (VASCULAR PRODUCTS) IMPLANT
TOWEL OR 17X24 6PK STRL BLUE (TOWEL DISPOSABLE) ×6 IMPLANT
TOWEL OR 17X26 10 PK STRL BLUE (TOWEL DISPOSABLE) ×6 IMPLANT
TRAP SPECIMEN MUCOUS 40CC (MISCELLANEOUS) ×4 IMPLANT
TRAY FOLEY CATH 14FRSI W/METER (CATHETERS) ×3 IMPLANT
TUNNELER SHEATH ON-Q 11GX8 (MISCELLANEOUS) IMPLANT
WATER STERILE IRR 1000ML POUR (IV SOLUTION) ×6 IMPLANT

## 2011-09-24 NOTE — ED Provider Notes (Signed)
History     CSN: [id number]  Arrival date & time 09/24/11  0222   First MD Initiated Contact with Patient 09/24/11 [phone]      Chief Complaint  Patient presents with  . Shortness of Breath     Patient is a 41 y.o. female presenting with shortness of breath. The history is provided by a caregiver.  Shortness of Breath  Episode onset: Symptoms started on the 15th. The problem occurs continuously. The problem has been gradually worsening. The problem is severe. Nothing relieves the symptoms. Nothing aggravates the symptoms. Associated symptoms include chest pain, a fever and shortness of breath.  Pt had been seen by her primary doctor and was told that she had a virus.  She then followed up at an urgent care and was treated for bronchitis.  Today her cough was getting worse despite nebulizer treatments.  Tonight she was complaining of more short of breath.  She woke up crying for help.  She has been having pain in her chest and her side with these complaints.  Past Medical History  Diagnosis Date  . Bronchitis   . Down syndrome   . Pneumonia     multpile times  . Dementia     Past Surgical History  Procedure Date  . Back surgery   . Anterior cruciate ligament repair     History reviewed. No pertinent family history.  History  Substance Use Topics  . Smoking status: Never Smoker   . Smokeless tobacco: Not on file  . Alcohol Use: No    OB History    Grav Para Term Preterm Abortions TAB SAB Ect Mult Living                  Review of Systems  Constitutional: Positive for fever.  Respiratory: Positive for shortness of breath.   Cardiovascular: Positive for chest pain. Negative for leg swelling.  All other systems reviewed and are negative.    Allergies  Review of patient's allergies indicates no known allergies.  Home Medications   Current Outpatient Rx  Name Route Sig Dispense Refill  . BUSPIRONE HCL 15 MG PO TABS Oral Take 15 mg by mouth 2 (two) times daily.      Marland Kitchen FLUOXETINE HCL 20 MG PO CAPS Oral Take 20 mg by mouth 3 (three) times daily.    . IBUPROFEN 800 MG PO TABS Oral Take 800 mg by mouth every 8 (eight) hours as needed. For pain.    Marland Kitchen LEVOTHYROXINE SODIUM 112 MCG PO TABS Oral Take 112 mcg by mouth daily.    Marland Kitchen MEMANTINE HCL 10 MG PO TABS Oral Take 10 mg by mouth 2 (two) times daily.      Pulse 144  Resp 26  SpO2 88%  LMP 09/18/2011  Physical Exam  Nursing note and vitals reviewed. Constitutional: She appears well-developed and well-nourished. No distress.  HENT:  Head: Normocephalic and atraumatic.  Right Ear: External ear normal.  Left Ear: External ear normal.  Eyes: Conjunctivae are normal. Right eye exhibits no discharge. Left eye exhibits no discharge. No scleral icterus.  Neck: Neck supple. No tracheal deviation present.  Cardiovascular: Regular rhythm and intact distal pulses.  Tachycardia present.   Pulmonary/Chest: No stridor. Tachypnea noted. No respiratory distress. She has decreased breath sounds in the right lower field, the left middle field and the left lower field. She has no wheezes. She has no rales.  Abdominal: Soft. Bowel sounds are normal. She exhibits no distension. There is  no tenderness. There is no rebound and no guarding.  Musculoskeletal: She exhibits no edema and no tenderness.  Neurological: She is alert. She has normal strength. No sensory deficit. Cranial nerve deficit:  no gross defecits noted. She exhibits normal muscle tone. She displays no seizure activity. Coordination normal.  Skin: Skin is warm and dry. No rash noted.  Psychiatric: She has a normal mood and affect.    ED Course  Procedures (including critical care time)  Rate: 134  Rhythm: sinus tach  QRS Axis: normal  Intervals: normal  ST/T Wave abnormalities: nonspecific t wave changes  Conduction Disutrbances:none  Narrative Interpretation:   Old EKG Reviewed: rate faster CRITICAL CARE Performed by: Linwood Dibbles R Total critical care  time:40 Critical care time was exclusive of separately billable procedures and treating other patients. Critical care was necessary to treat or prevent imminent or life-threatening deterioration. Critical care was time spent personally by me on the following activities: development of treatment plan with patient and/or surrogate as well as nursing, discussions with consultants, evaluation of patient's response to treatment, examination of patient, obtaining history from patient or surrogate, ordering and performing treatments and interventions, ordering and review of laboratory studies, ordering and review of radiographic studies, pulse oximetry and re-evaluation of patient's condition.  Labs Reviewed  PRO B NATRIURETIC PEPTIDE - Abnormal; Notable for the following:    Pro B Natriuretic peptide (BNP) 207.7 (*)     All other components within normal limits  CBC - Abnormal; Notable for the following:    WBC 15.6 (*)     MCV 100.8 (*)     All other components within normal limits  D-DIMER, QUANTITATIVE - Abnormal; Notable for the following:    D-Dimer, Quant 5.04 (*)     All other components within normal limits  COMPREHENSIVE METABOLIC PANEL - Abnormal; Notable for the following:    Glucose, Bld 171 (*)     Calcium 8.3 (*)     Albumin 2.5 (*)     AST 89 (*)     ALT 234 (*)     Alkaline Phosphatase 172 (*)     GFR calc non Af Amer 82 (*)     All other components within normal limits  URINALYSIS, ROUTINE W REFLEX MICROSCOPIC - Abnormal; Notable for the following:    Color, Urine AMBER (*)  BIOCHEMICALS MAY BE AFFECTED BY COLOR   APPearance CLOUDY (*)     Hgb urine dipstick LARGE (*)     Bilirubin Urine SMALL (*)     Ketones, ur 15 (*)     Protein, ur 30 (*)     All other components within normal limits  URINE MICROSCOPIC-ADD ON - Abnormal; Notable for the following:    Squamous Epithelial / LPF FEW (*)     All other components within normal limits  LIPASE, BLOOD  POCT I-STAT TROPONIN  I  LACTIC ACID, PLASMA  CULTURE, BLOOD (ROUTINE X 2)  CULTURE, BLOOD (ROUTINE X 2)   Dg Chest Port 1 View  09/24/2011  *RADIOLOGY REPORT*  Clinical Data: Shortness of breath  PORTABLE CHEST - 1 VIEW  Comparison: 01/13/2010  Findings: Interval development of near complete whiteout of the left hemithorax.  Mediastinal structures are shifted rightward, therefore suggests pleural effusion. An underlying lesion cannot be excluded.  Right lung base predominate clear.  Mild rightward curvature of the spine.  No acute osseous finding.  IMPRESSION: Interval left hemithorax opacification and rightward mediastinal shift.  Suggests a large left pleural  effusion with associated pneumonia versus atelectasis.  Original Report Authenticated By: Waneta Martins, M.D.     1. Community acquired pneumonia       Medications  ipratropium-albuterol (DUONEB) 0.5-2.5 (3) MG/3ML SOLN (not administered)  cefTRIAXone (ROCEPHIN) 1 g in dextrose 5 % 50 mL IVPB (not administered)  azithromycin (ZITHROMAX) 500 mg in dextrose 5 % 250 mL IVPB (not administered)  sodium chloride 0.9 % bolus 1,000 mL (not administered)  white petrolatum (VASELINE) gel (not administered)  albuterol (PROVENTIL) (5 MG/ML) 0.5% nebulizer solution 2.5 mg (2.5 mg Nebulization Given 09/24/11 0323)  ipratropium (ATROVENT) nebulizer solution 0.26 mg (  Nebulization Given 09/24/11 0323)  morphine 4 MG/ML injection 4 mg (4 mg Intravenous Given 09/24/11 0259)    MDM  Pt with large effusion on right.    History would suggest that this is most likely a pna. Will add CT chest to evaluate further.  Pt also has increase in lfts.  Could be related to the infection.   Will US abdomen to evaluate further.  IV abx have been ordered.  Pt will be admitted to stepdown unit for further treatment and evaluation.        Celene Kras, MD 09/24/11 [phone]

## 2011-09-24 NOTE — H&P (Signed)
[facility] History and Physical  [name] ZOX:[id number] DOB: Jan 03, 1971 DOA: 09/24/2011  Referring physician: ED physician PCP: No primary provider on file.   Chief Complaint: Shortness of breath  HPI:  Pt is 41 yo female with Down syndrome who came to ED with main concern of progressively worsening shortness of breath associated with productive cough, fevers, chills, chest pain, poor oral intake, nausea and generalized abdominal pain. This has initially started about one week prior to admission and has not been getting better. Pt denies any specific aggravating or alleviating factors, denies similar episodes in the past. Pt has seen PCP for this and was treated for bronchitis and was told to use nebulizers at home but that did not offer significant relief.   Assessment and Plan: Principal Problem:  *Shortness of breath - secondary to large left pleural effusion and PNA - will admit the pt to step down unit for further evaluation and management - CT chest ordered for further evaluation of mediastinal shift - consulted PCCM  - will treat As CAP, with Zithromax and Rocephin - will also continue nebulizers as per home medication regimen - monitor vitals per floor protocol   Active Problems:  PNA (pneumonia) - pneumonia order sets placed - continue ABX as noted above - follow up on CT chest    Transaminitis - unclear etiology at this time - pt has some tenderness in abdominal area - will follow upon abdominal US - obtain CMET in AM   Leukocytosis - likely secondary to PNA - CBC in AM - continue ABX as noted above   Tachycardia - secondary to principal problem and ? Use of nebulizers   Hyperglycemia - will check A1C  Code Status: Full Family Communication: Pt at bedside Disposition Plan: PT evaluation    Review of Systems:  Constitutional: Positive for fever, chills and malaise/fatigue. Negative for diaphoresis.  HENT: Negative for hearing loss, ear pain,  nosebleeds, congestion, sore throat, neck pain, tinnitus and ear discharge.   Eyes: Negative for blurred vision, double vision, photophobia, pain, discharge and redness.  Respiratory: Positive for cough, sputum production, shortness of breath, negative for wheezing and stridor.   Cardiovascular: Positive for chest pain, negative for palpitations, orthopnea, claudication and leg swelling.  Gastrointestinal: Positive for nausea, and abdominal pain. Negative for heartburn, constipation, blood in stool and melena, vomiting Genitourinary: Negative for dysuria, urgency, frequency, hematuria and flank pain.  Musculoskeletal: Negative for myalgias, back pain, joint pain and falls.  Skin: Negative for itching and rash.  Neurological: Negative for dizziness, positive for generalized weakness. Negative for tingling, tremors, sensory change, speech change, focal weakness, loss of consciousness. Endo/Heme/Allergies: Negative for environmental allergies and polydipsia. Does not bruise/bleed easily.  Psychiatric/Behavioral: Negative for suicidal ideas. The patient is not nervous/anxious.      Past Medical History  Diagnosis Date  . Bronchitis   . Down syndrome   . Pneumonia     multpile times  . Dementia     Past Surgical History  Procedure Date  . Back surgery   . Anterior cruciate ligament repair     Social History:  reports that she has never smoked. She does not have any smokeless tobacco history on file. She reports that she does not drink alcohol or use illicit drugs.  No Known Allergies  No family history of hear diseases and no family history of cancers.   Prior to Admission medications   Medication Sig Start Date End Date Taking? Authorizing Provider  busPIRone (BUSPAR)  15 MG tablet Take 15 mg by mouth 2 (two) times daily.   Yes Historical Provider, MD  FLUoxetine (PROZAC) 20 MG capsule Take 20 mg by mouth 3 (three) times daily.   Yes Historical Provider, MD  ibuprofen (ADVIL,MOTRIN)  800 MG tablet Take 800 mg by mouth every 8 (eight) hours as needed. For pain.   Yes Historical Provider, MD  ipratropium-albuterol (DUONEB) 0.5-2.5 (3) MG/3ML SOLN Take 3 mLs by nebulization every 6 (six) hours as needed. For shortness of breath   Yes Historical Provider, MD  levothyroxine (SYNTHROID, LEVOTHROID) 112 MCG tablet Take 112 mcg by mouth daily.   Yes Historical Provider, MD  memantine (NAMENDA) 10 MG tablet Take 10 mg by mouth 2 (two) times daily.   Yes Historical Provider, MD    Physical Exam: Filed Vitals:   09/24/11 0224 09/24/11 0323 09/24/11 0443  BP:   120/68  Pulse: 144  118  Temp:   99.1 F (37.3 C)  TempSrc:   Oral  Resp: 26  33  SpO2: 88% 95% 92%    Physical Exam  Constitutional: Appears well-developed and well-nourished. No distress.  HENT: Normocephalic. External right and left ear normal. Oropharynx is clear and moist.  Eyes: Conjunctivae and EOM are normal. PERRLA, no scleral icterus.  Neck: Normal ROM. Neck supple. No JVD. No tracheal deviation. No thyromegaly.  CVS: Regular rhythm, tachycardic, S1/S2 +, no murmurs, no gallops, no carotid bruit.  Pulmonary: Tachypnea, decreased breath sounds left > right, crackles + Abdominal: Soft. BS +,  no distension, tenderness in epigastric area, no rebound or guarding.  Musculoskeletal: Normal range of motion. No edema and no tenderness.  Lymphadenopathy: No lymphadenopathy noted, cervical, inguinal. Neuro: Alert. Normal reflexes, muscle tone coordination. No cranial nerve deficit. Overall non focal.  Skin: Skin is warm and dry. No rash noted. Not diaphoretic. No erythema. No pallor.  Psychiatric: Normal mood and affect. Behavior, judgment, thought content normal.   Labs on Admission:  Basic Metabolic Panel:  Lab 09/24/11 8295  NA 137  K 3.7  CL 100  CO2 22  GLUCOSE 171*  BUN 15  CREATININE 0.87  CALCIUM 8.3*  MG --  PHOS --   Liver Function Tests:  Lab 09/24/11 0259  AST 89*  ALT 234*  ALKPHOS 172*   BILITOT 0.6  PROT 6.8  ALBUMIN 2.5*    Lab 09/24/11 0259  LIPASE 12  AMYLASE --   CBC:  Lab 09/24/11 0259  WBC 15.6*  NEUTROABS --  HGB 13.2  HCT 39.4  MCV 100.8*  PLT 383    Radiological Exams on Admission:  Dg Chest Port 1 View 09/24/2011 IMPRESSION:  Interval left hemithorax opacification and rightward mediastinal shift.   Suggests a large left pleural effusion with associated pneumonia versus atelectasis.     EKG: Normal sinus rhythm, no ST/T wave changes  Debbora Presto, MD  [facility] Pager [phone]  If 7PM-7AM, please contact night-coverage [email or web address] Password [email or web address] 09/24/2011, 5:13 AM

## 2011-09-24 NOTE — Anesthesia Procedure Notes (Addendum)
Procedure Name: Intubation Date/Time: 09/24/2011 12:13 PM Performed by: Ellin Goodie Pre-anesthesia Checklist: Patient identified, Emergency Drugs available, Suction available, Patient being monitored and Timeout performed Patient Re-evaluated:Patient Re-evaluated prior to inductionOxygen Delivery Method: Circle system utilized Preoxygenation: Pre-oxygenation with 100% oxygen Intubation Type: IV induction Ventilation: Mask ventilation without difficulty Laryngoscope Size: Mac and 3 Grade View: Grade I Tube type: Oral Endobronchial tube: Double lumen EBT and Left and 35 Fr Number of attempts: 1 Airway Equipment and Method: Stylet Placement Confirmation: ETT inserted through vocal cords under direct vision,  positive ETCO2 and breath sounds checked- equal and bilateral Secured at: 29 cm Tube secured with: Tape Dental Injury: Teeth and Oropharynx as per pre-operative assessment    Procedure Name: Intubation Date/Time: 09/24/2011 2:59 PM Performed by: Ellin Goodie Pre-anesthesia Checklist: Patient identified, Emergency Drugs available, Suction available, Patient being monitored and Timeout performed Patient Re-evaluated:Patient Re-evaluated prior to inductionOxygen Delivery Method: Circle system utilized Preoxygenation: Pre-oxygenation with 100% oxygen Intubation Type: IV induction Ventilation: Mask ventilation without difficulty Laryngoscope Size: Mac Grade View: Grade I Tube type: Oral Tube size: 8.0 mm Number of attempts: 1 Placement Confirmation: ETT inserted through vocal cords under direct vision,  positive ETCO2 and breath sounds checked- equal and bilateral Secured at: 23 cm Tube secured with: Tape Dental Injury: Teeth and Oropharynx as per pre-operative assessment  Comments: DLT changed over tube exchanger to 8.0 reinforced tube.

## 2011-09-24 NOTE — Progress Notes (Signed)
8:24 AM I agree with HPI/GPe and A/P per Dr. Izola Price  Patient doing well, but SOB.  Sats at bedside in the mid 90's .  Articulating well and understands what is going on around her-met with mother in room as well to explain POC.  HEENT-no pallor, no icterus, Very dry oral mucosa.  NO JVD notable, no bruit CHEST-+ TVF/R on the L Post hemithorax extending upto mid chest CARDIAC-s1 s2 tachycardic, Seems to be in NSR, No Murmur ABDOMEN-Soft NT/ND NEURO-Oriented, able to follow simple commands SKIN/MUSCULAR-no rash  Patient Active Problem List  Diagnosis  . Shortness of breath  . PNA (pneumonia)  . Leukocytosis  . Tachycardia  . Transaminitis  . Hyperglycemia    A/P  Pleural Effusion/Empyema-Dr. Delford Field of [facility] aware of patients-Thoracocentesis planned today.  Transaminitis-potentially 2/2 to Legionnella PNA vs NASH, given her Habitus.  USG abd pending.  Macrocytic anemia-Per PCP  Hyperglycemia NOS-A1c Pending  [name]-Moderate MR 2/2 to the same  Keep on team 6 Further disposition dependant on clinical progression  Pleas Koch, MD [facility] [phone]

## 2011-09-24 NOTE — Op Note (Signed)
CARDIOTHORACIC SURGERY OPERATIVE NOTE  Date of Procedure:   09/24/2011  Preoperative Diagnosis:  Acute Left Empyema  Postoperative Diagnosis:  same  Procedure:   Left Video Assisted Thoracoscopy for Drainage of Empyema  Bronchoscopy   Surgeon:    Salvatore Decent. Cornelius Moras, MD  Assistant:    Gershon Crane, PA-C  Anesthesia:    Achille Rich, MD  Operative Findings:   Acute suppurative empyema  Severely consolidated left lung  No endobronchial abnormalities     BRIEF CLINICAL NOTE AND INDICATIONS FOR SURGERY  Patient is a 41 year old obese white female with Down syndrome was in her usual state of health until approximately 7-10 days ago when she began to develop fever, cough, and shortness of breath. Symptoms progressed prompting the patient's mother to take her to urgent care where she was given a prescription for antibiotics and inhaler. Symptoms continued to worsen and the patient began to develop left-sided pleuritic chest pain and abdominal pain. She was admitted to [facility] early this morning where chest x-ray in followup chest CT scan revealed a large loculated left pleural effusion with nearly complete collapse of the left lung. The patient was seen in consultation by Dr. Delford Field and needle thoracentesis was attempted but unsuccessful. The patient was transferred to [facility] and cardiothoracic surgical consultation has been requested.  The patient has been seen in consultation and both she and her family have been counseled at length regarding the indications, risks and potential benefits of surgery.  All questions have been answered, and they provide full informed consent for the operation as described.       DETAILS OF THE OPERATIVE PROCEDURE  The patient is brought to the operating room on the above mentioned date and placed in the supine position on the operating table.  Intravenous antibiotics were administered.  A radial arterial line  and central venous catheter were placed for monitoring and intravenous access.  General endotracheal anesthesia was induced uneventfully.  The patient was intubated using a dual lumen endotracheal tube.  A Foley catheter was placed.  The patient was turned to the right lateral decubitus position.  The patient's left chest was prepared and draped in a sterile manner.  A time out procedure was performed.  A small incision was made in the anterior axillary line over approximately the 7th intercostal space.  The left chest was entered bluntly.  Thin serosanguinous fluid was evacuated.  Specimens of fluid were trapped and sent for culture, cytology and routine lab work.  A blunt sucker tip was utilized to break up loculations.  A port was placed through the incision and a thoracoscopic camera passed through the port.  The left chest was explored visually.  There was severe acute suppurative inflammation, although visability was suboptimal.  A second incision was placed posteriorly over the 5th intercostal space.  This incision was extended over 5 cm to provide adequate access.  The left pleural space was further explored thoracoscopically and all loculations broken up.  A total of > 1300 mL of serosanguinous fluid was evacuated.  There were no dense adhesions although the left lung was densely consolidated and erythematous.  There was thickened parietal pleura, a portion of which was sent for routine histology.  The left pleural space was irrigated with copious saline solution and drained using 2 #36 French chest tubes.  The miniature thoracotomy incision was closed in layers and a sterile dressing applied.  The patient was turned to the supine position.  Flexible fiberoptic bronchoscopy was performed through the bronchial (distal) lumen of the endotracheal tube after pulling the tube back so that the carina could be visualized.  There were no endobronchial foreign bodies nor tumors noted.  There was diffuse airway  edema and erythema without much secretions.  The bronchoscope was removed uneventfully.  The patient tolerated the procedure well, although she could not be extubated in the operating room as she was slow to recover from anesthesia.  The endotracheal tube was changed to a single lumen tube and the patient was transported to the SICU intubated and sedated in stable condition.  There were no intraoperative complications.  All sponge, instrument and needle counts were correct.  Blood loss was trivial.     Salvatore Decent. Cornelius Moras MD 09/24/2011 2:23 PM

## 2011-09-24 NOTE — ED Notes (Signed)
Pt currently being treated for bronchitis,  She is sob,

## 2011-09-24 NOTE — Procedures (Signed)
Attempted Left thoracentesis in the posterior 8th ICS.  Unable to obtain fluid from this position.  Pt very agitated and procedure cancelled.     Shan Levans Beeper  [phone]  Cell  [phone]  If no response or cell goes to voicemail, call beeper [phone]

## 2011-09-24 NOTE — Consult Note (Signed)
CARDIOTHORACIC SURGERY CONSULTATION REPORT  PCP is No primary provider on file. Referring Provider is WRIGHT, Charlcie Cradle, MD   Reason for consultation:  empyema  HPI:  Patient is a 41 year old obese white female with Down syndrome was in her usual state of health until approximately 7-10 days ago when she began to develop fever, cough, and shortness of breath. Symptoms progressed prompting the patient's mother to take her to urgent care where she was given a prescription for antibiotics and inhaler. Symptoms continued to worsen and the patient began to develop left-sided pleuritic chest pain and abdominal pain. She was admitted to [facility] early this morning where chest x-ray in followup chest CT scan revealed a large loculated left pleural effusion with nearly complete collapse of the left lung. The patient was seen in consultation by Dr. Delford Field and needle thoracentesis was attempted but unsuccessful. The patient was transferred to [facility] and cardiothoracic surgical consultation has been requested.  The patient complains of pain across left side of the chest with inspiration and cough. The patient currently denies abdominal pain. The patient reports significant shortness of breath. The patient has had fever but the denies fever presently. Appetite is diminished but she is been needing and drinking some prior to admission. Bowel function has been regular.  Past Medical History  Diagnosis Date  . Bronchitis   . Down syndrome   . Pneumonia     multpile times  . Dementia     Past Surgical History  Procedure Date  . Back surgery   . Anterior cruciate ligament repair     History reviewed. No pertinent family history.  Social History History  Substance Use Topics  . Smoking status: Never Smoker   . Smokeless tobacco: Not on file  . Alcohol Use: No    Prior to Admission medications   Medication Sig Start Date End Date Taking?  Authorizing Provider  busPIRone (BUSPAR) 15 MG tablet Take 15 mg by mouth 2 (two) times daily.   Yes Historical Provider, MD  FLUoxetine (PROZAC) 20 MG capsule Take 20 mg by mouth 3 (three) times daily.   Yes Historical Provider, MD  ibuprofen (ADVIL,MOTRIN) 800 MG tablet Take 800 mg by mouth every 8 (eight) hours as needed. For pain.   Yes Historical Provider, MD  ipratropium-albuterol (DUONEB) 0.5-2.5 (3) MG/3ML SOLN Take 3 mLs by nebulization every 6 (six) hours as needed. For shortness of breath   Yes Historical Provider, MD  levothyroxine (SYNTHROID, LEVOTHROID) 112 MCG tablet Take 112 mcg by mouth daily.   Yes Historical Provider, MD  memantine (NAMENDA) 10 MG tablet Take 10 mg by mouth 2 (two) times daily.   Yes Historical Provider, MD    Current Facility-Administered Medications  Medication Dose Route Frequency Provider Last Rate Last Dose  . 0.9 %  sodium chloride infusion   Intravenous Continuous Dorothea Ogle, MD 50 mL/hr at 09/24/11 0740    . albuterol (PROVENTIL) (5 MG/ML) 0.5% nebulizer solution 2.5 mg  2.5 mg Nebulization Once Celene Kras, MD   2.5 mg at 09/24/11 0323  . albuterol (PROVENTIL) (5 MG/ML) 0.5% nebulizer solution 2.5 mg  2.5 mg Nebulization Q2H PRN Dorothea Ogle, MD      . azithromycin [facility]) tablet 500 mg  500 mg Oral Daily Rhetta Mura, MD      . busPIRone (BUSPAR) tablet 15 mg  15 mg Oral BID Dorothea Ogle, MD      .  cefTRIAXone (ROCEPHIN) 1 g in dextrose 5 % 50 mL IVPB  1 g Intravenous Q24H Dorothea Ogle, MD   1 g at 09/24/11 8657  . FLUoxetine (PROZAC) capsule 20 mg  20 mg Oral TID Dorothea Ogle, MD      . HYDROcodone-acetaminophen (NORCO/VICODIN) 5-325 MG per tablet 1-2 tablet  1-2 tablet Oral Q4H PRN Dorothea Ogle, MD   1 tablet at 09/24/11 0743  . iohexol (OMNIPAQUE) 350 MG/ML injection 100 mL  100 mL Intravenous Once PRN Medication Radiologist, MD   100 mL at 09/24/11 0534  . ipratropium (ATROVENT) nebulizer solution 0.26 mg  0.26 mg Nebulization  Once Celene Kras, MD      . ipratropium (ATROVENT) nebulizer solution 0.26 mg  0.26 mg Nebulization Q4H PRN Dorothea Ogle, MD      . levothyroxine (SYNTHROID, LEVOTHROID) tablet 112 mcg  112 mcg Oral Daily Dorothea Ogle, MD      . memantine [facility]) tablet 10 mg  10 mg Oral BID Dorothea Ogle, MD      . morphine 4 MG/ML injection 4 mg  4 mg Intravenous Once Celene Kras, MD   4 mg at 09/24/11 0259  . mupirocin ointment (BACTROBAN) 2 %   Nasal BID Nelda Bucks, MD      . ondansetron [facility]) tablet 4 mg  4 mg Oral Q6H PRN Dorothea Ogle, MD       Or  . ondansetron [facility]) injection 4 mg  4 mg Intravenous Q6H PRN Dorothea Ogle, MD      . pantoprazole (PROTONIX) injection 40 mg  40 mg Intravenous Q24H Storm Frisk, MD      . white petrolatum (VASELINE) gel        1 application at 09/24/11 0519  . DISCONTD: azithromycin (ZITHROMAX) 500 mg in dextrose 5 % 250 mL IVPB  500 mg Intravenous Q24H Celene Kras, MD      . DISCONTD: azithromycin (ZITHROMAX) tablet 500 mg  500 mg Oral Q24H Dorothea Ogle, MD   500 mg at 09/24/11 0654  . DISCONTD: cefTRIAXone (ROCEPHIN) 1 g in dextrose 5 % 50 mL IVPB  1 g Intravenous Once Celene Kras, MD      . DISCONTD: enoxaparin (LOVENOX) injection 30 mg  30 mg Subcutaneous Q24H Dorothea Ogle, MD      . DISCONTD: sodium chloride 0.9 % bolus 1,000 mL  1,000 mL Intravenous Once Celene Kras, MD   1,000 mL at 09/24/11 0544    No Known Allergies  Review of Systems:  General:  decreased appetite, decreased energy   Respiratory:  + cough, no wheezing, no hemoptysis, + pain with inspiration or cough, + shortness of breath  Cardiac:  + no chest pain or tightness, + exertional SOB, + resting SOB, no PND, no orthopnea, no LE edema, no palpitations, no syncope  GI:   no difficulty swallowing, no hematochezia, no hematemesis, no melena, no constipation, no diarrhea   GU:   no dysuria, no urgency, no frequency   Musculoskeletal: no arthritis, no arthralgia   Vascular:  no  pain suggestive of claudication   Neuro:   no symptoms suggestive of TIA's, no seizures, no headaches, no peripheral neuropathy   Endocrine:  Negative   HEENT:  no loose teeth or painful teeth,  no recent vision changes  Psych:   no anxiety, no depression    Physical Exam:   BP 131/62  Pulse 126  Temp 98.2 F (36.8 C) (Oral)  Resp 29  Ht 5' (1.524 m)  Wt 105.6 kg (232 lb 12.9 oz)  BMI 45.47 kg/m2  SpO2 91%  LMP 09/18/2011  General:  obese female in NAD  HEENT:  Unremarkable   Neck:   no JVD, no bruits, no adenopathy   Chest:   Severely diminished breath sounds left side, no wheezes, no rhonchi   CV:   RRR, no  murmur   Abdomen:  soft, non-tender, no masses   Extremities:  warm, well-perfused, pulses palpable  Rectal/GU  Deferred  Neuro:   Grossly non-focal and symmetrical throughout  Skin:   Clean and dry, no rashes, no breakdown  Diagnostic Tests:  *RADIOLOGY REPORT*  Clinical Data: Chest pain, shortness of breath.  CT ANGIOGRAPHY CHEST  Technique: Multidetector CT imaging of the chest using the  standard protocol during bolus administration of intravenous  contrast. Multiplanar reconstructed images including MIPs were  obtained and reviewed to evaluate the vascular anatomy.  Contrast: OMNIPAQUE IOHEXOL 350 MG/ML SOLN  Comparison: 09/24/2011 radiograph  Findings: Degraded by respiratory motion. No main branch pulmonary  arterial filling defect. The more peripheral branches are  nondiagnostic due to the motion.  Large partially lobulated left pleural effusion results in near  complete collapse of the left lung. There is a 3.2 cm area of  heterogeneous attenuation within the atelectatic left lung as seen  on series 7, image 194. This may reflect an underlying pneumonia  however recommend follow-up if symptoms worsen to exclude  developing abscess or after therapy to exclude a mass. There are  mild areas of ground-glass opacity within the right lung and a  trace  right pleural effusion. No definite pneumothorax. Central  airways are poorly evaluated due to respiratory motion. Cannot  confirm the left main bronchus is not occluded.  Mediastinal contours are displaced rightward. The heart size is  within normal limits. No definite pericardial effusion.  Limited images through the upper abdomen demonstrate no acute  finding.  No acute osseous finding.  IMPRESSION:  Large left pleural effusion with associated left lung collapse.  There is a focal area of heterogeneous attenuation within the  collapsed left lung, may reflect pneumonia. Developing abscess or  mass not excluded. Recommend attention on follow-up.  Due to respiratory motion, I cannot confirm patency of the left  main bronchus. Consider bronchoscopy.  Trace right pleural effusion and mild associated consolidation.  Mild ground-glass opacity may reflect edema or atypical infection.  Original Report Authenticated By: Waneta Martins, M.D.  Results for RAYVIN, ABID (MRN [id number]) as of 09/24/2011 11:06  Ref. Range 09/24/2011 02:59  Sodium Latest Range: 135-145 mEq/L 137  Potassium Latest Range: 3.5-5.1 mEq/L 3.7  Chloride Latest Range: 96-112 mEq/L 100  CO2 Latest Range: 19-32 mEq/L 22  BUN Latest Range: 6-23 mg/dL 15  Creat Latest Range: 0.50-1.10 mg/dL 4.09  Calcium Latest Range: 8.4-10.5 mg/dL 8.3 (L)  GFR calc non Af Amer Latest Range: >90 mL/min 82 (L)  GFR calc Af Amer Latest Range: >90 mL/min >90  Glucose Latest Range: 70-99 mg/dL 811 (H)  Alkaline Phosphatase Latest Range: 39-117 U/L 172 (H)  Albumin Latest Range: 3.5-5.2 g/dL 2.5 (L)  Lipase Latest Range: 11-59 U/L 12  AST Latest Range: 0-37 U/L 89 (H)  ALT Latest Range: 0-35 U/L 234 (H)  Total Protein Latest Range: 6.0-8.3 g/dL 6.8  Total Bilirubin Latest Range: 0.3-1.2 mg/dL 0.6  Pro B Natriuretic peptide (BNP) Latest Range: 0-125 pg/mL  207.7 (H)  WBC Latest Range: 4.0-10.5 K/uL 15.6 (H)  RBC Latest Range: 3.87-5.11  MIL/uL 3.91  Hemoglobin Latest Range: 12.0-15.0 g/dL 16.1  HCT Latest Range: 36.0-46.0 % 39.4  MCV Latest Range: 78.0-100.0 fL 100.8 (H)  MCH Latest Range: 26.0-34.0 pg 33.8  MCHC Latest Range: 30.0-36.0 g/dL 09.6  RDW Latest Range: 11.5-15.5 % 14.0  Platelets Latest Range: 150-400 K/uL 383  D-Dimer, Quant Latest Range: 0.00-0.48 ug/mL-FEU 5.04 (H)      Impression:  Community acquired pneumonia with large loculated left pleural effusion and likely empyema. I agree that the patient needs definitive surgical drainage.  The patient has had history of multiple pneumonias in the past.  Plan:  I have outlined the indications, risks, and potential benefits of video-assisted thoracoscopy and possible thoracotomy for drainage empyema with the patient and her parents. We will also plan flexible bronchoscopy after the pleural effusion has been drained to rule out endobronchial abnormality or foreign body which might cause recurrent pneumonia. They understand and accept all potential associated risks of surgery including but not limited to risk of death, respiratory failure, recurrent or persistent pneumonia, prolonged chest wall pain, bleeding requiring blood transfusion, prolonged air leak requiring chest tube drainage, arrhythmia, wound infection, pulmonary embolus, or other unforeseen complications. All of their questions been addressed. We plan to proceed directly to the operating room today.    Salvatore Decent. Cornelius Moras, MD 09/24/2011 10:58 AM

## 2011-09-24 NOTE — Consult Note (Signed)
Name: [name] MRN: [id number] DOB: 24-Mar-1970    LOS: 0  Referring Provider:  [facility] hospitalist Reason for Referral:  Pneumonia pleural effusion  PULMONARY / CRITICAL CARE MEDICINE  HPI:  This is a 41 year old white female Down syndrome patient with community acquired pneumonia and large left compressive atelectatic pleural effusion. Patient has had a several day history of increased dyspnea and cough. This patient also has had increasing respiratory distress and was brought to the emergency room. According to patient's mother this patient has been to the emergency room and has been in the hospital previously in the last 6 months for pneumonia. This patient's had the same side of pneumonia seen. The patient is a nonsmoker. The patient is fairly functional at this time. The patient does participate in a daycare setting.  Pulmonary was consult and for management of the pleural effusion and respiratory failure.  Past Medical History  Diagnosis Date  . Bronchitis   . Down syndrome   . Pneumonia     multpile times  . Dementia    Past Surgical History  Procedure Date  . Back surgery   . Anterior cruciate ligament repair    Prior to Admission medications   Medication Sig Start Date End Date Taking? Authorizing Provider  busPIRone (BUSPAR) 15 MG tablet Take 15 mg by mouth 2 (two) times daily.   Yes Historical Provider, MD  FLUoxetine (PROZAC) 20 MG capsule Take 20 mg by mouth 3 (three) times daily.   Yes Historical Provider, MD  ibuprofen (ADVIL,MOTRIN) 800 MG tablet Take 800 mg by mouth every 8 (eight) hours as needed. For pain.   Yes Historical Provider, MD  ipratropium-albuterol (DUONEB) 0.5-2.5 (3) MG/3ML SOLN Take 3 mLs by nebulization every 6 (six) hours as needed. For shortness of breath   Yes Historical Provider, MD  levothyroxine (SYNTHROID, LEVOTHROID) 112 MCG tablet Take 112 mcg by mouth daily.   Yes Historical Provider, MD  memantine (NAMENDA) 10 MG tablet Take 10 mg by  mouth 2 (two) times daily.   Yes Historical Provider, MD   Allergies No Known Allergies  Family History History reviewed. No pertinent family history. Social History  reports that she has never smoked. She does not have any smokeless tobacco history on file. She reports that she does not drink alcohol or use illicit drugs.  Review Of Systems:  11 point review of systems taken in detail and is negative except as noted above  Brief patient description:  41 year old white female Down's patient with pneumonia and pleural effusion  Events Since Admission: Thoracentesis 09/24/2011>>failed attempt .  Not able to obtain fluid due to loculations.  Current Status: The patient was initially examined in the intensive care unit is not on bilevel support at this time  Vital Signs: Temp:  [98.2 F (36.8 C)-99.1 F (37.3 C)] 98.2 F (36.8 C) (07/28 0800) Pulse Rate:  [118-144] 118  (07/28 0706) Resp:  [24-33] 24  (07/28 0706) BP: (109-120)/(68) 109/68 mmHg (07/28 0706) SpO2:  [88 %-95 %] 95 % (07/28 0706)  Physical Examination: General:  Obese Down syndrome patient in no acute distress Neuro:  Awake and alert no focal deficits HEENT:  Moist mucous membranes no lesions seen Neck:  Neck supple no thyromegaly Cardiovascular:  Regular rate and rhythm normal S1-S2 no S3 or S4 no murmur or rub or gallop Lungs:  Complete diminishment of breath sounds on the on the left.  Dull to percussion three quarters way up on the left Abdomen:  Soft  nontender no organomegaly Musculoskeletal:  No joint deformity noted Skin:  Intact  Principal Problem:  *PNA (pneumonia) Active Problems:  Shortness of breath  Leukocytosis  Tachycardia  Transaminitis  Hyperglycemia  Pleural effusion   ASSESSMENT AND PLAN  PULMONARY No results found for this basename: PHART:5,PCO2:5,PCO2ART:5,PO2ART:5,HCO3:5,O2SAT:5 in the last 168 hours Ventilator Settings: on Nasal cannulae   CXR/CT chest:  Large left pleural  effusion with loculations seen on CT scan ETT:  None  A:  Large left pleural effusion with loculations and collapse of left lung , probable community-acquired pneumonia P:   Transfer to [facility] for thoracotomy later today This case was discussed with Dr. Tressie Stalker thoracic surgery Dr. Cornelius Moras states he will be taking the patient to the operating room later today Continue current antibiotics as prescribed  CARDIOVASCULAR  Lab 09/24/11 0753 09/24/11 0525 09/24/11 0259  TROPONINI <0.30 -- --  LATICACIDVEN -- 1.2 --  PROBNP -- -- 207.7*   ECG:  Reviewed Lines: Peripheral IV  A: Hemodynamically stable P:  Monitor  RENAL  Lab 09/24/11 0752 09/24/11 0259  NA -- 137  K -- 3.7  CL -- 100  CO2 -- 22  BUN -- 15  CREATININE -- 0.87  CALCIUM -- 8.3*  MG 1.9 --  PHOS 3.1 --   Intake/Output      07/27 0701 - 07/28 0700 07/28 0701 - 07/29 0700        Urine Occurrence 1 x     Foley:  None  A:  No active renal issues P:   Monitor  GASTROINTESTINAL  Lab 09/24/11 0259  AST 89*  ALT 234*  ALKPHOS 172*  BILITOT 0.6  PROT 6.8  ALBUMIN 2.5*    A:  Mild elevations in liver function tests likely due to sepsis P:   Monitor  HEMATOLOGIC  Lab 09/24/11 0259  HGB 13.2  HCT 39.4  PLT 383  INR --  APTT --   A:  Mild leukocytosis P:  Monitor  INFECTIOUS  Lab 09/24/11 0259  WBC 15.6*  PROCALCITON --   Cultures: Blood cultures 09/24/2011  Antibiotics: Rocephin 09/24/2011(CAP) Azithromycin 09/24/2011 (CAP)  A:  Community acquired pneumonia with probable empyema P:   Transfer to [facility] for thorascopic surgery to be done later today  ENDOCRINE  Lab 09/24/11 0830  GLUCAP 130*   A:  Mild hyperglycemia   P:   Monitor CBGs  NEUROLOGIC  A:  Mild dementia due to Down syndrome P:   1 assure  BEST PRACTICE / DISPOSITION Level of Care:  ICU Primary Service:  PC CM at [facility] Consultants: Zigmund Daniel Code Status:  Full Diet:  N.p.o. DVT Px:   Low molecular weight heparin GI Px:  Protonix Skin Integrity:  Intact Social / Family:  Mother updated at bedside      Shan Levans, M.D. Pulmonary and Critical Care Medicine [facility] Pager: [phone]  09/24/2011, 9:14 AM

## 2011-09-24 NOTE — Progress Notes (Signed)
ANTIBIOTIC CONSULT NOTE - INITIAL  Pharmacy Consult for Vanco Indication: pneumonia  No Known Allergies  Patient Measurements: Height: 4\' 11"  (149.9 cm) Weight: 228 lb 9.9 oz (103.7 kg) IBW/kg (Calculated) : 43.2  Adjusted Body Weight:    Vital Signs: Temp: 99.5 F (37.5 C) (07/28 1030) Temp src: Oral (07/28 1030) BP: 95/53 mmHg (07/28 1527) Pulse Rate: 91  (07/28 1527) Intake/Output from previous day:   Intake/Output from this shift: Total I/O In: 2200 [I.V.:1900; IV Piggyback:300] Out: 1400 [Urine:75; Other:1300; Blood:25]  Labs:  [facility] 09/24/11 1110 09/24/11 0259  WBC 15.8* 15.6*  HGB 12.9 13.2  PLT 359 383  LABCREA -- --  CREATININE 0.84 0.87   Estimated Creatinine Clearance: 93.8 ml/min (by C-G formula based on Cr of 0.84). No results found for this basename: VANCOTROUGH:2,VANCOPEAK:2,VANCORANDOM:2,GENTTROUGH:2,GENTPEAK:2,GENTRANDOM:2,TOBRATROUGH:2,TOBRAPEAK:2,TOBRARND:2,AMIKACINPEAK:2,AMIKACINTROU:2,AMIKACIN:2, in the last 72 hours   Microbiology: Recent Results (from the past 720 hour(s))  MRSA PCR SCREENING     Status: Abnormal   Collection Time   09/24/11  8:28 AM      Component Value Range Status Comment   MRSA by PCR POSITIVE (*) NEGATIVE Final     Medical History: Past Medical History  Diagnosis Date  . Bronchitis   . Down syndrome   . Pneumonia     multpile times  . Dementia   . Empyema of left pleural space 09/24/2011    Medications:  Prescriptions prior to admission  Medication Sig Dispense Refill  . busPIRone (BUSPAR) 15 MG tablet Take 15 mg by mouth 2 (two) times daily.      Marland Kitchen FLUoxetine (PROZAC) 20 MG capsule Take 20 mg by mouth 3 (three) times daily.      Marland Kitchen ibuprofen (ADVIL,MOTRIN) 800 MG tablet Take 800 mg by mouth every 8 (eight) hours as needed. For pain.      Marland Kitchen ipratropium-albuterol (DUONEB) 0.5-2.5 (3) MG/3ML SOLN Take 3 mLs by nebulization every 6 (six) hours as needed. For shortness of breath      . levothyroxine (SYNTHROID,  LEVOTHROID) 112 MCG tablet Take 112 mcg by mouth daily.      . memantine (NAMENDA) 10 MG tablet Take 10 mg by mouth 2 (two) times daily.       Assessment: SOB, fever, cough. VATS today 7/28 for acute L empyema.  Anticoag- SCDs  ID- WBC 15.8. Tmax 99.5. 7/28: Azithro>> 7/28: Rocephin>> 7/28: Vanco>>  Endo- Hypothyroid. Synthroid IV (112 mcg po). SSI  GI/Nutr- NPO. IV PPI  Neuro- [name], dementia  Renal- Cr 0.84  Pulm- Hx bronchitis & mult episodes pna  PTA Medication Issues- Buspirone, fluoxetine, memantine  Goal of Therapy:  Vancomycin trough level 15-20 mcg/ml  Plan:  Vanco 1500mg  IV at 1300 today. Vancomycin 1000mg  IV q12h. Trough after 3-5 doses at steady state.  Merilynn Finland, [employer] 09/24/2011,3:49 PM

## 2011-09-24 NOTE — OR Nursing (Signed)
End time for VATS - 1430.  Start time for Bronch -1437.

## 2011-09-24 NOTE — Transfer of Care (Signed)
Immediate Anesthesia Transfer of Care Note  Patient: [name]  Procedure(s) Performed: Procedure(s) (LRB): VIDEO ASSISTED THORACOSCOPY (VATS)/THOROCOTOMY (Left) FLEXIBLE BRONCHOSCOPY (N/A)  Patient Location: SICU  Anesthesia Type: General  Level of Consciousness: sedated  Airway & Oxygen Therapy: Patient remains intubated per anesthesia plan  Post-op Assessment: Report given to PACU RN  Post vital signs: stable  Complications: No apparent anesthesia complications

## 2011-09-24 NOTE — Anesthesia Postprocedure Evaluation (Signed)
  Anesthesia Post-op Note  Patient: [name]  Procedure(s) Performed: Procedure(s) (LRB): VIDEO ASSISTED THORACOSCOPY (VATS)/THOROCOTOMY (Left) FLEXIBLE BRONCHOSCOPY (N/A)  Patient Location: SICU  Anesthesia Type: General  Level of Consciousness: sedated  Airway and Oxygen Therapy: Patient remains intubated per anesthesia plan  Post-op Pain: none  Post-op Assessment: Post-op Vital signs reviewed  Post-op Vital Signs: stable  Complications: No apparent anesthesia complications

## 2011-09-24 NOTE — Anesthesia Preprocedure Evaluation (Addendum)
Anesthesia Evaluation  Patient identified by MRN, date of birth, ID band Patient awake    Reviewed: Allergy & Precautions, H&P , NPO status , Patient's Chart, lab work & pertinent test results, reviewed documented beta blocker date and time   Airway Mallampati: II TM Distance: <3 FB Neck ROM: Full  Mouth opening: Limited Mouth Opening  Dental  (+) Teeth Intact   Pulmonary shortness of breath and at rest, pneumonia -, resolved,    + wheezing      Cardiovascular negative cardio ROS  Rhythm:Regular Rate:Normal     Neuro/Psych    GI/Hepatic negative GI ROS, Neg liver ROS,   Endo/Other  Hypothyroidism   Renal/GU negative Renal ROS  negative genitourinary   Musculoskeletal negative musculoskeletal ROS (+)   Abdominal (+)  Abdomen: soft. Bowel sounds: normal.  Peds negative pediatric ROS (+)  Hematology negative hematology ROS (+)   Anesthesia Other Findings   Reproductive/Obstetrics negative OB ROS                         Anesthesia Physical Anesthesia Plan  ASA: III  Anesthesia Plan: General   Post-op Pain Management:    Induction: Intravenous  Airway Management Planned: Oral ETT and Double Lumen EBT  Additional Equipment:   Intra-op Plan:   Post-operative Plan: Possible Post-op intubation/ventilation  Informed Consent: I have reviewed the patients History and Physical, chart, labs and discussed the procedure including the risks, benefits and alternatives for the proposed anesthesia with the patient or authorized representative who has indicated his/her understanding and acceptance.   Dental advisory given  Plan Discussed with: CRNA, Anesthesiologist and Surgeon  Anesthesia Plan Comments:        Anesthesia Quick Evaluation

## 2011-09-24 NOTE — Progress Notes (Signed)
[facility] BRIEF SICU PROGRESS NOTE  Day of Surgery  S/P Procedure(s) (LRB): VIDEO ASSISTED THORACOSCOPY (VATS)/THOROCOTOMY (Left) FLEXIBLE BRONCHOSCOPY (N/A)   Sedated on vent Vitals stable O2 sats 95% on 60% FiO2 ABG 7.36/41/62/24 Chest tube output low, thin UOP adequate CXR not done  Plan: Vent wean and extubation per Pulm/CCM team  [name] H 09/24/2011 5:41 PM

## 2011-09-25 ENCOUNTER — Inpatient Hospital Stay (HOSPITAL_COMMUNITY): Payer: Medicare Other

## 2011-09-25 ENCOUNTER — Encounter (HOSPITAL_COMMUNITY): Payer: Self-pay | Admitting: Thoracic Surgery (Cardiothoracic Vascular Surgery)

## 2011-09-25 DIAGNOSIS — R000 Tachycardia, unspecified: Secondary | ICD-10-CM

## 2011-09-25 DIAGNOSIS — J869 Pyothorax without fistula: Principal | ICD-10-CM

## 2011-09-25 DIAGNOSIS — D72829 Elevated white blood cell count, unspecified: Secondary | ICD-10-CM

## 2011-09-25 DIAGNOSIS — J9601 Acute respiratory failure with hypoxia: Secondary | ICD-10-CM

## 2011-09-25 DIAGNOSIS — R7309 Other abnormal glucose: Secondary | ICD-10-CM

## 2011-09-25 DIAGNOSIS — J9600 Acute respiratory failure, unspecified whether with hypoxia or hypercapnia: Secondary | ICD-10-CM

## 2011-09-25 DIAGNOSIS — R0602 Shortness of breath: Secondary | ICD-10-CM

## 2011-09-25 LAB — BASIC METABOLIC PANEL
Calcium: 7.1 mg/dL — ABNORMAL LOW (ref 8.4–10.5)
Creatinine, Ser: 0.72 mg/dL (ref 0.50–1.10)
GFR calc Af Amer: >90 mL/min (ref >90)
GFR calc non Af Amer: >90 mL/min (ref >90)
Sodium: 134 mEq/L — ABNORMAL LOW (ref 135–145)

## 2011-09-25 LAB — BLOOD GAS, ARTERIAL
Drawn by: 34779
MECHVT: 500 mL
PEEP: 5 cmH2O
Patient temperature: 98.9
TCO2: 25.7 mmol/L (ref 0–100)
pCO2 arterial: 49.4 mmHg — ABNORMAL HIGH (ref 35.0–45.0)
pH, Arterial: 7.311 — ABNORMAL LOW (ref 7.350–7.450)

## 2011-09-25 LAB — POCT I-STAT 3, ART BLOOD GAS (G3+)
TCO2: 29 mmol/L (ref 0–100)
pCO2 arterial: 50 mmHg — ABNORMAL HIGH (ref 35.0–45.0)
pH, Arterial: 7.346 — ABNORMAL LOW (ref 7.350–7.450)

## 2011-09-25 LAB — GLUCOSE, CAPILLARY
Glucose-Capillary: 110 mg/dL — ABNORMAL HIGH (ref 70–99)
Glucose-Capillary: 122 mg/dL — ABNORMAL HIGH (ref 70–99)
Glucose-Capillary: 131 mg/dL — ABNORMAL HIGH (ref 70–99)
Glucose-Capillary: 136 mg/dL — ABNORMAL HIGH (ref 70–99)
Glucose-Capillary: 137 mg/dL — ABNORMAL HIGH (ref 70–99)

## 2011-09-25 LAB — CBC
HCT: 31.7 % — ABNORMAL LOW (ref 36.0–46.0)
MCH: 34.5 pg — ABNORMAL HIGH (ref 26.0–34.0)
MCV: 104.3 fL — ABNORMAL HIGH (ref 78.0–100.0)
Platelets: 248 10*3/uL (ref 150–400)
RBC: 3.04 MIL/uL — ABNORMAL LOW (ref 3.87–5.11)
WBC: 16.1 10*3/uL — ABNORMAL HIGH (ref 4.0–10.5)

## 2011-09-25 LAB — LEGIONELLA ANTIGEN, URINE

## 2011-09-25 MED ORDER — ACETAMINOPHEN 160 MG/5ML PO SOLN
650.0000 mg | Freq: Four times a day (QID) | ORAL | Status: DC | PRN
  Administered 2011-09-25 – 2011-10-10 (×6): 650 mg via ORAL
  Filled 2011-09-25 (×6): qty 20.3

## 2011-09-25 MED ORDER — LEVALBUTEROL HCL 0.63 MG/3ML IN NEBU
0.6300 mg | INHALATION_SOLUTION | Freq: Four times a day (QID) | RESPIRATORY_TRACT | Status: AC
  Administered 2011-09-25 – 2011-09-26 (×4): 0.63 mg via RESPIRATORY_TRACT
  Filled 2011-09-25 (×7): qty 3

## 2011-09-25 MED ORDER — FUROSEMIDE 10 MG/ML IJ SOLN
40.0000 mg | Freq: Once | INTRAMUSCULAR | Status: AC
  Administered 2011-09-25: 40 mg via INTRAVENOUS
  Filled 2011-09-25: qty 4

## 2011-09-25 MED ORDER — MIDAZOLAM HCL 2 MG/2ML IJ SOLN: 1.0000 mg | INTRAMUSCULAR | Status: DC | PRN

## 2011-09-25 MED ORDER — DEXTROSE 5 % IV SOLN
500.0000 mg | INTRAVENOUS | Status: AC
  Administered 2011-09-25 – 2011-09-27 (×3): 500 mg via INTRAVENOUS
  Filled 2011-09-25 (×3): qty 500

## 2011-09-25 MED FILL — Albuterol Sulfate Soln Nebu 0.5% (5 MG/ML): RESPIRATORY_TRACT | Qty: 0.5 | Status: AC

## 2011-09-25 NOTE — Progress Notes (Signed)
Patient ID: [name], female   DOB: [birth year], 41 y.o.   MRN: [id number]  POD # 1 L VATS, drainage of empyema  Extubated 10:30 this morning  Wheezing  BP 108/74  Pulse 119  Temp 98.7 F (37.1 C) (Oral)  Resp 20  Ht 4\' 11"  (1.499 m)  Wt 233 lb 11 oz (106 kg)  BMI 47.20 kg/m2  SpO2 95%  LMP 09/18/2011   Intake/Output Summary (Last 24 hours) at 09/25/11 1810 Last data filed at 09/25/11 1800  Gross per 24 hour  Intake 3814.5 ml  Output    740 ml  Net 3074.5 ml    Will try xopenex for wheezing, is tachycardic so will avoid albuterol for now

## 2011-09-25 NOTE — Consult Note (Signed)
Name: [name] MRN: [id number] DOB: February 13, 1971    LOS: 1  Referring Provider:  [facility] hospitalist Reason for Referral:  Pneumonia pleural effusion  PULMONARY / CRITICAL CARE MEDICINE  Brief patient description:  41 year old white female Down's patient with pneumonia and pleural effusion, s/p decrot for empyema, post op resp failure.  Events Since Admission: Thoracentesis 09/24/2011>>failed attempt .  Not able to obtain fluid due to loculations. 7/28- decrtication for empyema, reintubated in PACU  Current Status: On 50%  Vital Signs: Temp:  [95.2 F (35.1 C)-99.5 F (37.5 C)] 97.9 F (36.6 C) (07/29 0739) Pulse Rate:  [81-126] 88  (07/29 0800) Resp:  [13-29] 15  (07/29 0800) BP: (81-131)/(42-79) 86/42 mmHg (07/29 0800) SpO2:  [91 %-100 %] 99 % (07/29 0800) Arterial Line BP: (84-139)/(47-84) 85/47 mmHg (07/29 0800) FiO2 (%):  [49.9 %-60.4 %] 49.9 % (07/29 0800) Weight:  [103.7 kg (228 lb 9.9 oz)-106 kg (233 lb 11 oz)] 106 kg (233 lb 11 oz) (07/29 0400)  Physical Examination: General:  Obese Down syndrome patient on vent Neuro: opeens eyes to voice, rass -1 HEENT: rt ij dressed, dry Neck:  Ett, short Cardiovascular:  Regular rate and rhythm normal S1-S2 no S3 or S4 no murmur or rub or gallop Lungs:  Reduced coarse BS left Abdomen:  Soft nontender no organomegaly Musculoskeletal:  No joint deformity noted Skin:  Intact  Principal Problem:  *Empyema of left pleural space Active Problems:  Shortness of breath  PNA (pneumonia)  Leukocytosis  Tachycardia  Transaminitis  Hyperglycemia  Pleural effusion   ASSESSMENT AND PLAN  PULMONARY  Lab 09/25/11 0345 09/24/11 1637  PHART 7.311* 7.356  PCO2ART 49.4* 41.2  PO2ART 94.4 62.0*  HCO3 24.2* 23.6  O2SAT 97.2 93.0   Ventilator Settings: on Nasal cannulae Vent Mode:  [-] PRVC FiO2 (%):  [49.9 %-60.4 %] 49.9 % Set Rate:  [15 bmp] 15 bmp Vt Set:  [500 mL] 500 mL PEEP:  [5 cmH20] 5 cmH20 Plateau Pressure:  [18  cmH20-29 cmH20] 26 cmH20 CXR/CT chest:  Large left pleural effusion with loculations seen on CT scan pcxr 7/29- diffuse hazziness left, CT x 2 wnl, ett wnl, line short on rt ( will assess fxn) ETT:  None  A:  Empyema, PNA, post op resp failure P:   abg reviewed, not breating over set rate, increase rate to 18 Wean cpap 5 ps 5, assess rsbi, abg on wean at 30 min  pcxr in am  Chest tubes per CVTS Need WUA now, limit versed if able Even balance goal  CARDIOVASCULAR  Lab 09/24/11 0753 09/24/11 0525 09/24/11 0259  TROPONINI <0.30 -- --  LATICACIDVEN -- 1.2 --  PROBNP -- -- 207.7*   ECG:  Reviewed Lines: Peripheral IV  A: Hemodynamically stable P:  Monitor A line MAp at 61, may run low Ensure TSH - 1.4, downs  RENAL  Lab 09/25/11 0400 09/24/11 1110 09/24/11 0752 09/24/11 0259  NA 134* 138 -- 137  K 4.5 4.0 -- --  CL 104 102 -- 100  CO2 24 25 -- 22  BUN 11 14 -- 15  CREATININE 0.72 0.84 -- 0.87  CALCIUM 7.1* 8.3* -- 8.3*  MG -- -- 1.9 --  PHOS -- -- 3.1 --   Intake/Output      07/28 0701 - 07/29 0700 07/29 0701 - 07/30 0700   I.V. (mL/kg) 3433.8 (32.4) 248.5 (2.3)   NG/GT  30   IV Piggyback 750    Total Intake(mL/kg) 4183.8 (39.5)  278.5 (2.6)   Urine (mL/kg/hr) 560 (0.2)    Other 1300    Blood 25    Chest Tube 240    Total Output 2125    Net +2058.8 +278.5        Urine Occurrence 1 x     Foley:  None  A:  Hyponatremia P:   Continue NS Reduce rate 50  GASTROINTESTINAL  Lab 09/24/11 1110 09/24/11 0259  AST 43* 89*  ALT 189* 234*  ALKPHOS 148* 172*  BILITOT 0.5 0.6  PROT 6.6 6.8  ALBUMIN 2.3* 2.5*    A:  Mild elevations in liver function tests likely due to sepsis P:   IF not extubated, start TF ppi  HEMATOLOGIC  Lab 09/25/11 0400 09/24/11 1110 09/24/11 0259  HGB 10.5* 12.9 13.2  HCT 31.7* 39.3 39.4  PLT 248 359 383  INR -- -- --  APTT -- 28 --   A:  Mild leukocytosis, anemia P:  scd Sub q hep when ok by surgery  INFECTIOUS  Lab  09/25/11 0400 09/24/11 1110 09/24/11 0259  WBC 16.1* 15.8* 15.6*  PROCALCITON -- -- --   Cultures: Blood cultures 09/24/2011  Antibiotics: Rocephin 09/24/2011(CAP) Azithromycin 09/24/2011 (CAP) vanc 7/28>>>  A:  Community acquired pneumonia with empyema P:   Follow OR cultures Continue vanc Change azithro to IV, unilateral process, low threshold to dc Continue ceftriaxone Leg ag  ENDOCRINE  Lab 09/25/11 0731 09/25/11 0416 09/24/11 2359 09/24/11 2003 09/24/11 1616  GLUCAP 122* 137* 136* 205* 141*   A:  Mild hyperglycemia   P:   Monitor CBGs If rise again, dc d5 in fluids  NEUROLOGIC  A:  Mild dementia due to Down syndrome P:   Avoid versed drip with dementia and downs Continue fent WUA mandatory  BEST PRACTICE / DISPOSITION Level of Care:  ICU Primary Service:  PCCM  Consultants: Zigmund Trayven Lumadue Code Status:  Full Diet:  N.p.o. DVT Px:  scd GI Px:  Protonix Skin Integrity:  Intact Social / Family:  Mother updated at bedside  Ccm time 35 min    Nelda Bucks., M.D. Pulmonary and Critical Care Medicine [facility] Pager: [phone]  09/25/2011, 8:36 AM  Mcarthur Rossetti. Tyson Alias, MD, FACP Pgr: [phone] [facility]

## 2011-09-25 NOTE — Progress Notes (Signed)
   CARDIOTHORACIC SURGERY PROGRESS NOTE   R1 Day Post-Op Procedure(s) (LRB): VIDEO ASSISTED THORACOSCOPY (VATS)/THOROCOTOMY (Left) FLEXIBLE BRONCHOSCOPY (N/A)  Subjective: Sedated on vent.  Objective: Vital signs: BP Readings from Last 1 Encounters:  09/25/11 86/42   Pulse Readings from Last 1 Encounters:  09/25/11 88   Resp Readings from Last 1 Encounters:  09/25/11 15   Temp Readings from Last 1 Encounters:  09/25/11 97.9 F (36.6 C) Oral    Hemodynamics:    Physical Exam:  Rhythm:   sinus  Breath sounds: Fairly clear, reportedly minimal secretions  Heart sounds:  RRR  Incisions:  Dressings dry  Abdomen:  soft  Extremities:  Warm  Chest tubes:  Minimal output, no air leak   Intake/Output from previous day: 07/28 0701 - 07/29 0700 In: 4183.8 [I.V.:3433.8; IV Piggyback:750] Out: 2125 [Urine:560; Blood:25; Chest Tube:240] Intake/Output this shift: Total I/O In: 278.5 [I.V.:248.5; NG/GT:30] Out: -   Lab Results:  Basename 09/25/11 0400 09/24/11 1110  WBC 16.1* 15.8*  HGB 10.5* 12.9  HCT 31.7* 39.3  PLT 248 359   BMET:  Basename 09/25/11 0400 09/24/11 1110  NA 134* 138  K 4.5 4.0  CL 104 102  CO2 24 25  GLUCOSE 151* 129*  BUN 11 14  CREATININE 0.72 0.84  CALCIUM 7.1* 8.3*    CBG (last 3)   Basename 09/25/11 0731 09/25/11 0416 09/24/11 2359  GLUCAP 122* 137* 136*   ABG    Component Value Date/Time   PHART 7.311* 09/25/2011 0345   HCO3 24.2* 09/25/2011 0345   TCO2 25.7 09/25/2011 0345   ACIDBASEDEF 1.2 09/25/2011 0345   O2SAT 97.2 09/25/2011 0345   CXR: Persistent opacification of left lung c/w dense parenchymal consolidation and pneumonia  Assessment/Plan: S/P Procedure(s) (LRB): VIDEO ASSISTED THORACOSCOPY (VATS)/THOROCOTOMY (Left) FLEXIBLE BRONCHOSCOPY (N/A)  Overall stable Oxygenation improved overnight Probably should tolerate extubation   Vent wean and extubation per Pulm/CCM team  Will cut sedation and see if acute vent  wean looks feasible  Will need nutritional support if unable to extubate within 2-3 days  Decrease IV fluids  No pharmacologic DVT prophylaxis due to risk of bleeding in acutely inflamed chest  OWEN,CLARENCE H 09/25/2011 8:44 AM

## 2011-09-25 NOTE — Progress Notes (Signed)
Four hours post extubation, patient's breathing appeared labored on 6L [place].  Patient unable to clear any secretions by coughing post extubation.  NTS and incentive spirometry unsuccessful to clear secretions.  O2 sats maintained at 93% or greater. CCM contacted about patient's labored breathing and apparent upper respiratory phlegm that is unable to be cleared.  After CCM visit, no new order given.  Will continue to monitor and assess.  Keitha Butte, RN

## 2011-09-25 NOTE — Procedures (Signed)
Extubation Procedure Note  Patient Details:   Name: [name] DOB: Mar 07, 1970 MRN: [id number]   Airway Documentation:   Pt extubated to 6L [place] per MD. No stridor noted. Pt able to vocalize name. IS 500 x5.   Evaluation  O2 sats: stable throughout and currently acceptable Complications: No apparent complications Patient did tolerate procedure well. Bilateral Breath Sounds: Rhonchi Suctioning: Airway;Oral   Christie Beckers 09/25/2011, 10:08 AM

## 2011-09-25 NOTE — Care Management Note (Signed)
    Page 1 of 2   10/12/2011     10:44:11 AM   CARE MANAGEMENT NOTE 10/12/2011  Patient:  JANELIS, STELZER   Account Number:  [id number]  Date Initiated:  09/25/2011  Documentation initiated by:  Junius Creamer  Subjective/Objective Assessment:   pneumonia, ple effusion, on vent     Action/Plan:   lives w fam, downs syndrome   Anticipated DC Date:  10/02/2011   Anticipated DC Plan:  HOME W HOME HEALTH SERVICES      DC Planning Services  CM consult      Choice offered to / List presented to:             Status of service:  Completed, signed off Medicare Important Message given?   (If response is "NO", the following Medicare IM given date fields will be blank) Date Medicare IM given:   Date Additional Medicare IM given:    Discharge Disposition:  LONG TERM ACUTE CARE (LTAC)  Per UR Regulation:  Reviewed for med. necessity/level of care/duration of stay  If discussed at Long Length of Stay Meetings, dates discussed:   10/03/2011    Comments:  Contact:  Wesolowski,[name] Other [phone]  10-12-11 10:35am  Avie Arenas, RNBSN - [phone] Talked with mother - [name] - they have decided to tx [facility] to [facility].  [facility] notified - have bed today, staff and physician notified.  Planning for tx today.  10-11-11 2:45pm Avie Arenas, RNBSN [phone] Patient trached per ENT -  Family agreeable to [facility] transfer as early as tomorrow.  Going to visit [facility]. Contacted [facility] with updated plan for tx.  Family still to make decision on which one.  10-09-11 11:45am Avie Arenas, RNBSN [phone] Family now wanting to wean and extubate prior to trach, to see if can fly.  Very agitated with weaning of sedation.  10-02-11 11am Avie Arenas, RNBSN [phone] per Physician - talked with mother today and looking at trach by end of week if not progressing.  Would be good candidate for [facility].  09-29-11 1pm Avie Arenas, RNBSN [phone] Ext on 7-29 - reintubated on 7-31- -  lateral tx to 2100 today.  09/27/11 JULIE AMERSON,RN,BSN 1145 PT S/P BRONCHOSCOPY AND VATS ON 09/24/11.  PTA, PT WITH DOWN SYNDROME, RESIDES WITH AND CARED FOR BY PARENTS.  CURRENTLY REMAINS INTUBATED, WILL FOLLOW AS PT PROGRESSES.  7/29 9am debbie dowell rn,bsn [phone]

## 2011-09-25 NOTE — Progress Notes (Addendum)
PCCM notified of patient's increased work of breathing, rate 25-35 per minute. Also increase in amount of O2 needed; she is now on Venti mask 50%. HR 120-140s. Pt diaphoretic. Temp 99.5 orally. Order for Lasix received. Will continue to monitor. Thresa Ross RN

## 2011-09-25 NOTE — Progress Notes (Signed)
Patient's IV fentanyl and versed drips discontinued.  50 ml Fentanyl and 20 ml Versed wasted in sink and verified by second RN, Dan Maker.  Keitha Butte, RN

## 2011-09-25 NOTE — Significant Event (Signed)
Pt with increased WOB and increased O2 needs.  Has positive fluid balance.  Will give dose of lasix 40 mg IV x one and monitor.  I/O last 3 completed shifts: In: 6285.3 [P.O.:660; I.V.:4285.3; NG/GT:30; IV Piggyback:1310] Out: 2510 [Urine:845; Other:1300; Blood:25; Chest Tube:340]  Coralyn Helling, MD 09/25/2011, 10:01 PM

## 2011-09-25 NOTE — Progress Notes (Signed)
RT NOTE:  NTS pt at this time. Minimal tan secretions, pt able to cough but nonproductive. Suctioned back of pt's mouth as well. Pt tolerated. RT will continue to monitor.

## 2011-09-26 ENCOUNTER — Inpatient Hospital Stay (HOSPITAL_COMMUNITY): Payer: Medicare Other

## 2011-09-26 LAB — CULTURE, RESPIRATORY W GRAM STAIN

## 2011-09-26 LAB — GLUCOSE, CAPILLARY: Glucose-Capillary: 125 mg/dL — ABNORMAL HIGH (ref 70–99)

## 2011-09-26 LAB — POCT I-STAT 3, ART BLOOD GAS (G3+)
Acid-base deficit: 1 mmol/L (ref 0.0–2.0)
pO2, Arterial: 196 mmHg — ABNORMAL HIGH (ref 80.0–100.0)

## 2011-09-26 LAB — CBC
MCH: 34.2 pg — ABNORMAL HIGH (ref 26.0–34.0)
MCHC: 32.4 g/dL (ref 30.0–36.0)
MCV: 105.7 fL — ABNORMAL HIGH (ref 78.0–100.0)
Platelets: 287 10*3/uL (ref 150–400)
RDW: 14.2 % (ref 11.5–15.5)
WBC: 20.8 10*3/uL — ABNORMAL HIGH (ref 4.0–10.5)

## 2011-09-26 LAB — VANCOMYCIN, TROUGH: Vancomycin Tr: 14.5 ug/mL (ref 10.0–20.0)

## 2011-09-26 LAB — LEGIONELLA ANTIGEN, URINE

## 2011-09-26 LAB — COMPREHENSIVE METABOLIC PANEL
AST: 26 U/L (ref 0–37)
Albumin: 1.8 g/dL — ABNORMAL LOW (ref 3.5–5.2)
Calcium: 7.6 mg/dL — ABNORMAL LOW (ref 8.4–10.5)
Creatinine, Ser: 1.1 mg/dL (ref 0.50–1.10)
Total Protein: 5.6 g/dL — ABNORMAL LOW (ref 6.0–8.3)

## 2011-09-26 LAB — PROCALCITONIN: Procalcitonin: 0.63 ng/mL

## 2011-09-26 MED ORDER — PRO-STAT SUGAR FREE PO LIQD
30.0000 mL | Freq: Four times a day (QID) | ORAL | Status: DC
  Administered 2011-09-26 – 2011-10-12 (×62): 30 mL
  Filled 2011-09-26 (×68): qty 30

## 2011-09-26 MED ORDER — VECURONIUM BROMIDE 10 MG IV SOLR
INTRAVENOUS | Status: AC
  Administered 2011-09-26: 4 mg
  Filled 2011-09-26: qty 10

## 2011-09-26 MED ORDER — OXEPA PO LIQD
1000.0000 mL | ORAL | Status: DC
  Administered 2011-09-26 – 2011-10-02 (×4): 1000 mL
  Filled 2011-09-26 (×7): qty 1000

## 2011-09-26 MED ORDER — FENTANYL CITRATE 0.05 MG/ML IJ SOLN: 100.0000 ug | Freq: Once | INTRAMUSCULAR | Status: AC

## 2011-09-26 MED ORDER — MIDAZOLAM HCL 2 MG/2ML IJ SOLN
3.5000 mg | Freq: Once | INTRAMUSCULAR | Status: AC
  Administered 2011-09-26: 3.5 mg via INTRAVENOUS

## 2011-09-26 MED ORDER — SODIUM CHLORIDE 0.9 % IV BOLUS (SEPSIS)
500.0000 mL | Freq: Once | INTRAVENOUS | Status: AC
  Administered 2011-09-26: 500 mL via INTRAVENOUS

## 2011-09-26 MED ORDER — MIDAZOLAM HCL 2 MG/2ML IJ SOLN
0.5000 mg | INTRAMUSCULAR | Status: DC | PRN
  Administered 2011-09-26 – 2011-09-28 (×3): 0.5 mg via INTRAVENOUS
  Administered 2011-09-29: 1 mg via INTRAVENOUS
  Administered 2011-09-29 (×2): 0.5 mg via INTRAVENOUS
  Filled 2011-09-26: qty 4
  Filled 2011-09-26 (×5): qty 2

## 2011-09-26 MED ORDER — METHYLPREDNISOLONE SODIUM SUCC 125 MG IJ SOLR
60.0000 mg | Freq: Three times a day (TID) | INTRAMUSCULAR | Status: DC
  Administered 2011-09-26 – 2011-09-27 (×3): 60 mg via INTRAVENOUS
  Filled 2011-09-26 (×6): qty 0.96

## 2011-09-26 MED ORDER — OXEPA PO LIQD
1000.0000 mL | ORAL | Status: DC
  Filled 2011-09-26 (×2): qty 1000

## 2011-09-26 MED ORDER — FENTANYL BOLUS VIA INFUSION
50.0000 ug | Freq: Four times a day (QID) | INTRAVENOUS | Status: DC | PRN
  Filled 2011-09-26: qty 100

## 2011-09-26 MED ORDER — SODIUM CHLORIDE 0.9 % IV SOLN
50.0000 ug/h | INTRAVENOUS | Status: DC
  Administered 2011-09-26 – 2011-09-30 (×4): 50 ug/h via INTRAVENOUS
  Filled 2011-09-26 (×4): qty 50

## 2011-09-26 MED ORDER — MIDAZOLAM HCL 2 MG/2ML IJ SOLN: 3.5000 mg | Freq: Once | INTRAMUSCULAR | Status: DC

## 2011-09-26 MED ORDER — SODIUM CHLORIDE 0.9 % IV SOLN
1.0000 mg/h | INTRAVENOUS | Status: DC
  Administered 2011-09-26 – 2011-09-27 (×2): 1 mg/h via INTRAVENOUS
  Filled 2011-09-26 (×2): qty 10

## 2011-09-26 MED ORDER — ETOMIDATE 2 MG/ML IV SOLN
INTRAVENOUS | Status: AC
  Filled 2011-09-26: qty 10

## 2011-09-26 MED ORDER — FENTANYL CITRATE 0.05 MG/ML IJ SOLN
100.0000 ug | Freq: Once | INTRAMUSCULAR | Status: AC
Start: 2011-09-26 — End: 2011-09-26
  Administered 2011-09-26: 100 ug via INTRAVENOUS

## 2011-09-26 MED ORDER — ETOMIDATE 2 MG/ML IV SOLN
10.0000 mg | Freq: Once | INTRAVENOUS | Status: AC
  Administered 2011-09-26: 10 mg via INTRAVENOUS

## 2011-09-26 MED ORDER — HALOPERIDOL LACTATE 5 MG/ML IJ SOLN
1.0000 mg | INTRAMUSCULAR | Status: DC | PRN
  Administered 2011-09-26: 4 mg via INTRAVENOUS
  Filled 2011-09-26: qty 1

## 2011-09-26 MED ORDER — ADULT MULTIVITAMIN LIQUID CH
5.0000 mL | Freq: Every day | ORAL | Status: DC
  Administered 2011-09-26 – 2011-10-12 (×17): 5 mL
  Filled 2011-09-26 (×17): qty 5

## 2011-09-26 NOTE — Progress Notes (Signed)
   CARDIOTHORACIC SURGERY PROGRESS NOTE  2 Days Post-Op  S/P Procedure(s) (LRB): VIDEO ASSISTED THORACOSCOPY (VATS)/THOROCOTOMY (Left) FLEXIBLE BRONCHOSCOPY (N/A)  Subjective: Awake and alert, denies pain or SOB at present, although tachypneic and tachycardic  Objective: Vital signs in last 24 hours: Temp:  [97.8 F (36.6 C)-100.1 F (37.8 C)] 97.8 F (36.6 C) (07/30 0729) Pulse Rate:  [107-136] 135  (07/30 0800) Cardiac Rhythm:  [-] Sinus tachycardia (07/30 0800) Resp:  [15-32] 26  (07/30 0700) BP: (92-141)/(25-91) 113/77 mmHg (07/30 0800) SpO2:  [86 %-99 %] 95 % (07/30 0800) Arterial Line BP: (91-145)/(65-86) 102/65 mmHg (07/29 1200) FiO2 (%):  [6 %-50 %] 50 % (07/30 0800)  Physical Exam:  Rhythm:   Sinus tach  Breath sounds: Few upper airway exp wheezes, decreased BS's on lef   Heart sounds:  tachy  Incisions:  Dressings dry  Abdomen:  soft  Extremities:  warm  Chest tube(s):  Low volume thin serosanguinous output, no air leak   Intake/Output from previous day: 07/29 0701 - 07/30 0700 In: 2930.5 [P.O.:660; I.V.:1426.5; NG/GT:30; IV Piggyback:814] Out: 1445 [Urine:1295; Chest Tube:150] Intake/Output this shift:    Lab Results:  Basename 09/26/11 0427 09/25/11 0400  WBC 20.8* 16.1*  HGB 11.4* 10.5*  HCT 35.2* 31.7*  PLT 287 248   BMET:  Basename 09/26/11 0427 09/25/11 0400  NA 135 134*  K 4.1 4.5  CL 102 104  CO2 25 24  GLUCOSE 144* 151*  BUN 11 11  CREATININE 1.10 0.72  CALCIUM 7.6* 7.1*    CBG (last 3)   Basename 09/26/11 0733 09/26/11 0024 09/25/11 1951  GLUCAP 125* 117* 161*    CXR:  *RADIOLOGY REPORT*  Clinical Data: Status post right VATS surgery for drainage of  empyema.  PORTABLE CHEST - 1 VIEW  Comparison: 09/25/2011  Findings: Central line positioning stable. Two separate left chest  tubes remain in place. Aeration of the left lung is relatively  stable with dense consolidation remaining throughout the lung. The  right lung remains  clear. No pneumothorax visualized.  IMPRESSION:  Stable dense consolidation of the left lung.  Original Report Authenticated By: Reola Calkins, M.D.   Assessment/Plan: S/P Procedure(s) (LRB): VIDEO ASSISTED THORACOSCOPY (VATS)/THOROCOTOMY (Left) FLEXIBLE BRONCHOSCOPY (N/A)  Severe recurrent community acquired pneumonia with acute empyema Persistent dense consolidation of left lung  Minimal airway secretions Stable but marginal since extubation Care confounded by preexisting medical and cognitive issues   Leave chest tubes to suction for now  Mobilize as much as possible  Keep NPO  Care per Dr Tyson Alias and Pulm/CCM team  [facility] H 09/26/2011 8:23 AM

## 2011-09-26 NOTE — Progress Notes (Signed)
INITIAL ADULT NUTRITION ASSESSMENT Date: 09/26/2011   Time: 1:01 PM  INTERVENTION:  Initiate Oxepa formula at 15 ml/hr and increase by 10 ml in 4 hours to goal rate of 25 ml/hr with Prostat liquid protein 30 ml 4 times daily via tube to provide 1300 kcals (68% of estimated kcal needs), 98 gm protein (100% of estimated protein needs), 471 ml of free water  Liquid MVI daily via tube RD to follow for nutrition care plan  Reason for Assessment: Consult  ASSESSMENT: Female 41 y.o.  Dx: Empyema of left pleural space  Hx:  Past Medical History  Diagnosis Date  . Bronchitis   . Down syndrome   . Pneumonia     multpile times  . Dementia   . Empyema of left pleural space 09/24/2011    Related Meds:     . antiseptic oral rinse  15 mL Mouth Rinse QID  . azithromycin  500 mg Intravenous Q24H  . bisacodyl  10 mg Oral Daily  . cefTRIAXone (ROCEPHIN)  IV  1 g Intravenous Q24H  . chlorhexidine  15 mL Mouth Rinse BID  . Chlorhexidine Gluconate Cloth  6 each Topical Q0600  . etomidate      . etomidate  10 mg Intravenous Once  . fentaNYL  100 mcg Intravenous Once  . fentaNYL  100 mcg Intravenous Once  . furosemide  40 mg Intravenous Once  . insulin aspart  0-24 Units Subcutaneous Q4H  . levalbuterol  0.63 mg Nebulization Q6H  . levothyroxine  50 mcg Intravenous Daily  . methylPREDNISolone (SOLU-MEDROL) injection  60 mg Intravenous Q8H  . midazolam  3.5 mg Intravenous Once  . midazolam  3.5 mg Intravenous Once  . mupirocin ointment   Nasal BID  . pantoprazole (PROTONIX) IV  40 mg Intravenous Q24H  . sodium chloride  500 mL Intravenous Once  . vancomycin  1,000 mg Intravenous Q12H  . vecuronium        Ht: 4\' 11"  (149.9 cm)  Wt: 233 lb 11 oz (106 kg)  Ideal Wt: 44.6 kg  % Ideal Wt: 237%  Usual Wt: unable to obtain % Usual Wt: ---  Body mass index is 47.20 kg/(m^2).  Food/Nutrition Related Hx: no triggers per admission nutrition screen  Labs:  CMP     Component Value  Date/Time   NA 135 09/26/2011 0427   K 4.1 09/26/2011 0427   CL 102 09/26/2011 0427   CO2 25 09/26/2011 0427   GLUCOSE 144* 09/26/2011 0427   BUN 11 09/26/2011 0427   CREATININE 1.10 09/26/2011 0427   CALCIUM 7.6* 09/26/2011 0427   PROT 5.6* 09/26/2011 0427   ALBUMIN 1.8* 09/26/2011 0427   AST 26 09/26/2011 0427   ALT 70* 09/26/2011 0427   ALKPHOS 115 09/26/2011 0427   BILITOT 0.3 09/26/2011 0427   GFRNONAA 61* 09/26/2011 0427   GFRAA 71* 09/26/2011 0427     Intake/Output Summary (Last 24 hours) at 09/26/11 1302 Last data filed at 09/26/11 1200  Gross per 24 hour  Intake 2085.75 ml  Output   1425 ml  Net 660.75 ml    CBG (last 3)   Basename 09/26/11 1121 09/26/11 0733 09/26/11 0024  GLUCAP 137* 125* 117*    Diet Order: NPO  Supplements/Tube Feeding: Oxepa formula ordered per Adult Enteral Nutrition Protocol  IVF:    dextrose 5 % and 0.9% NaCl Last Rate: 75 mL/hr at 09/26/11 0800  feeding supplement (OXEPA)   fentaNYL infusion INTRAVENOUS Last Rate: Stopped (09/25/11 0900)  fentaNYL infusion INTRAVENOUS Last Rate: 75 mcg/hr (09/26/11 1100)  midazolam (VERSED) infusion Last Rate: 1 mg/hr (09/26/11 1100)    Estimated Nutritional Needs:   Kcal: 1900 Protein: 90-100 gm Fluid: 1.9-2.0 L  RD unable to obtain nutrition hx from patient -- intubated & sedated; with Down syndrome who came to ED with main concern of progressively worsening shortness of breath associated with productive cough, fevers, chills, chest pain, poor oral intake, nausea and generalized abdominal pain; s/p VATS 7/28; intubated and extubated 7/29; re-intubated 7/30; OGT in place; Oxepa formula ordered via Protocol  NUTRITION DIAGNOSIS: -Inadequate oral intake (NI-2.1).  Status: Ongoing  RELATED TO: inability to eat  AS EVIDENCE BY: NPO status  MONITORING/EVALUATION(Goals): Goal: EN to provide 60-70% of estimated calorie needs (22-25 kcals/kg ideal body weight) and >/= 90% of estimated protein needs, based on  ASPEN guidelines for permissive underfeeding in critically ill obese individuals Monitor: EN regimen & tolerance, respiratory status, weight, labs, I/O's  EDUCATION NEEDS: -No education needs identified at this time  Dietitian #: [phone]  DOCUMENTATION CODES Per approved criteria  -Morbid Obesity    Alger Memos 09/26/2011, 1:01 PM

## 2011-09-26 NOTE — Progress Notes (Signed)
SICU p.m. Rounds  Patient examined and reviewed at bedside Resting comfortably on vent following \\re - intubation Good breath sounds bilaterally Tube feeding started via OG tube May benefit from post pyloric Panda tube as it appears she will need a prolonged intubation

## 2011-09-26 NOTE — Procedures (Signed)
Intubation Procedure Note JEWELINE REIF [id number] October 31, 1970  Procedure: Intubation Indications: Respiratory insufficiency  Procedure Details Consent: Risks of procedure as well as the alternatives and risks of each were explained to the (patient/caregiver).  Consent for procedure obtained. Time Out: Verified patient identification, verified procedure, site/side was marked, verified correct patient position, special equipment/implants available, medications/allergies/relevent history reviewed, required imaging and test results available.  Performed  Maximum sterile technique was used including antiseptics, gown, hand hygiene and mask.  MAC    Evaluation Hemodynamic Status: BP stable throughout; O2 sats: transiently fell during during procedure Patient's Current Condition: stable Complications: No apparent complications Patient did tolerate procedure well. Chest X-ray ordered to verify placement.  CXR: pending.   Nelda Bucks. 09/26/2011  Clean airway and normal cords

## 2011-09-26 NOTE — Progress Notes (Signed)
PT Cancellation/Discharge Note  Treatment cancelled today due to medical issues with patient which prohibited therapy. Pt now intubated.  Please reorder PT when appropriate.  Keigan Girten 09/26/2011, 4:02 PM  [employer] PT [phone]

## 2011-09-26 NOTE — Consult Note (Signed)
Name: [name] MRN: [id number] DOB: 1970/03/22    LOS: 2  Referring Provider:  [facility] hospitalist Reason for Referral:  Pneumonia pleural effusion  PULMONARY / CRITICAL CARE MEDICINE  Brief patient description:  41 year old white female Down's patient with pneumonia and pleural effusion, s/p decrot for empyema, post op resp failure.  Events Since Admission: Thoracentesis 09/24/2011>>failed attempt .  Not able to obtain fluid due to loculations. 7/28- decrtication for empyema, reintubated in PACU 7/20- extubated  Current Status: On 50%, tachy, agitatd  Vital Signs: Temp:  [97.8 F (36.6 C)-100.1 F (37.8 C)] 97.8 F (36.6 C) (07/30 0729) Pulse Rate:  [88-136] 126  (07/30 0700) Resp:  [13-32] 26  (07/30 0700) BP: (86-141)/(25-91) 101/62 mmHg (07/30 0700) SpO2:  [86 %-99 %] 94 % (07/30 0700) Arterial Line BP: (85-145)/(47-86) 102/65 mmHg (07/29 1200) FiO2 (%):  [6 %-50 %] 50 % (07/30 0700)  Physical Examination: General:  Obese Down syndrome pt Neuro: speaks full sentences rass 1 HEENT: rt ij dressed, ronchi, no stridor Neck:  Coarse sounds Cardiovascular:  Tachy rate and rhythm normal S1-S2 no S3 or S4 no murmur or rub or gallop Lungs:  Coarse left Abdomen:  Soft nontender no organomegaly Musculoskeletal:  No joint deformity noted Skin:  Intact  Principal Problem:  *Empyema of left pleural space Active Problems:  Shortness of breath  PNA (pneumonia)  Leukocytosis  Tachycardia  Transaminitis  Hyperglycemia  Pleural effusion  Acute respiratory failure with hypoxia   ASSESSMENT AND PLAN  PULMONARY  Lab 09/25/11 0930 09/25/11 0345 09/24/11 1637  PHART 7.346* 7.311* 7.356  PCO2ART 50.0* 49.4* 41.2  PO2ART 57.0* 94.4 62.0*  HCO3 27.5* 24.2* 23.6  O2SAT 88.0 97.2 93.0   Ventilator Settings: on Nasal cannulae Vent Mode:  [-] Stand-by FiO2 (%):  [6 %-50 %] 50 % Set Rate:  [15 bmp-18 bmp] 18 bmp Vt Set:  [0 mL-500 mL] 0 mL PEEP:  [5 cmH20] 5  cmH20 Pressure Support:  [5 cmH20] 5 cmH20 Plateau Pressure:  [26 cmH20] 26 cmH20 CXR/CT chest:  Large left pleural effusion with loculations seen on CT scan pcxr 7/29- diffuse hazziness left, CT x 2 wnl, ett wnl, line short on rt ( will assess fxn) ETT:  None  A:  Empyema, PNA, post op resp failure, post extubation agitation, (mom reports similar appearance when missing her psych meds) P:   Would avoid further lasix pcxr in am  Chest tubes per CVTS, remains to suction Allow pos balance See psych for additional meds strategy Add steroids for wheezing component unlikely NIMV would be tolerated, straigh to ETT if failed Flutter valve, NTS She speaks full sentences, difficult to tease out resp driven and neuro driven rr Control pain  CARDIOVASCULAR  Lab 09/24/11 0753 09/24/11 0525 09/24/11 0259  TROPONINI <0.30 -- --  LATICACIDVEN -- 1.2 --  PROBNP -- -- 207.7*   ECG:  Reviewed Lines: Peripheral IV  A: tachycardia, sinus P:  Ensure TSH - 1.4, continue IV synthroid Dc lasix further Allow pos balance  RENAL  Lab 09/26/11 0427 09/25/11 0400 09/24/11 1110 09/24/11 0752 09/24/11 0259  NA 135 134* 138 -- 137  K 4.1 4.5 -- -- --  CL 102 104 102 -- 100  CO2 25 24 25  -- 22  BUN 11 11 14  -- 15  CREATININE 1.10 0.72 0.84 -- 0.87  CALCIUM 7.6* 7.1* 8.3* -- 8.3*  MG -- -- -- 1.9 --  PHOS -- -- -- 3.1 --   Intake/Output  07/29 0701 - 07/30 0700 07/30 0701 - 07/31 0700   P.O. 660    I.V. (mL/kg) 1426.5 (13.5)    NG/GT 30    IV Piggyback 814    Total Intake(mL/kg) 2930.5 (27.6)    Urine (mL/kg/hr) 1295 (0.5)    Other     Blood     Chest Tube 150    Total Output 1445    Net +1485.5          Foley:  None  A:  Hyponatremia improved, ARF P:   Continue NS Dc lasix, has insensible losses Chem in am  Low threshold bolus Dc NSAID  GASTROINTESTINAL  Lab 09/26/11 0427 09/24/11 1110 09/24/11 0259  AST 26 43* 89*  ALT 70* 189* 234*  ALKPHOS 115 148* 172*  BILITOT  0.3 0.5 0.6  PROT 5.6* 6.6 6.8  ALBUMIN 1.8* 2.3* 2.5*    A:  Mild elevations in liver function tests likely due to sepsis, at risk re intubation P:   May need NGT and feeds, eval resp status re intubation needs inafternoon prior ppi Make again NPO Avoid around clock tyenal   HEMATOLOGIC  Lab 09/26/11 0427 09/25/11 0400 09/24/11 1110 09/24/11 0259  HGB 11.4* 10.5* 12.9 13.2  HCT 35.2* 31.7* 39.3 39.4  PLT 287 248 359 383  INR -- -- -- --  APTT -- -- 28 --   A:  Mild leukocytosis, anemia, hemoconcetration P:  scd No heparin, inflammation concerns of chest Dc lasix  INFECTIOUS  Lab 09/26/11 0427 09/25/11 0400 09/24/11 1110 09/24/11 0259  WBC 20.8* 16.1* 15.8* 15.6*  PROCALCITON -- -- -- --   Cultures: Blood cultures 09/24/2011 Leg ag - neg Pleural culture 7/28>>>  Antibiotics: Rocephin 09/24/2011(CAP) Azithromycin 09/24/2011 (CAP) vanc 7/28>>>  A:  Community acquired pneumonia with empyema, NO fever, hemoconcentrated WBC P:   Follow OR cultures Continue vanc Dc azithro at day 5  Continue ceftriaxone, if spike change to ceftaz  ENDOCRINE  Lab 09/26/11 0024 09/25/11 1951 09/25/11 1533 09/25/11 1127 09/25/11 0731  GLUCAP 117* 161* 110* 131* 122*   A:  Mild hyperglycemia   P:   Monitor CBGs If rise dc d5 in fluids, maintain for now as npo  NEUROLOGIC  A:  Mild dementia due to Down syndrome, agitation post extubation without oral regimen P:   Control pain and splinting, fentanyl At risk SSRI WD, when orals able restart nameda not required Control anxiety, low dose versed insetting dementia Add haldol prn, assess qtc  BEST PRACTICE / DISPOSITION Level of Care:  ICU Primary Service:  PCCM  Consultants: Zigmund Libby Goehring Code Status:  Full Diet:  N.p.o. DVT Px:  scd GI Px:  Protonix Skin Integrity:  Intact Social / Family:  Mother updated at bedside  Ccm time 30 min   Nelda Bucks., M.D. Pulmonary and Critical Care Medicine [facility] Pager: [phone]  09/26/2011, 7:45 AM  Mcarthur Rossetti. Tyson Alias, MD, FACP Pgr: [phone] [place] [facility]

## 2011-09-26 NOTE — Progress Notes (Signed)
ANTIBIOTIC CONSULT NOTE - FOLLOW UP  Pharmacy Consult for Vancomycin Indication: pneumonia  No Known Allergies  Patient Measurements: Height: 4\' 11"  (149.9 cm) Weight: 233 lb 11 oz (106 kg) IBW/kg (Calculated) : 43.2    Vital Signs: Temp: 99.3 F (37.4 C) (07/30 1128) Temp src: Oral (07/30 1128) BP: 94/55 mmHg (07/30 1400) Pulse Rate: 100  (07/30 1400) Intake/Output from previous day: 07/29 0701 - 07/30 0700 In: 2930.5 [P.O.:660; I.V.:1426.5; NG/GT:30; IV Piggyback:814] Out: 1445 [Urine:1295; Chest Tube:150] Intake/Output from this shift: Total I/O In: 1023.8 [I.V.:573.8; IV Piggyback:450] Out: 220 [Urine:220]  Labs:  [facility] 09/26/11 0427 09/25/11 0400 09/24/11 1110  WBC 20.8* 16.1* 15.8*  HGB 11.4* 10.5* 12.9  PLT 287 248 359  LABCREA -- -- --  CREATININE 1.10 0.72 0.84   Estimated Creatinine Clearance: 72.6 ml/min (by C-G formula based on Cr of 1.1).  Basename 09/26/11 1230  VANCOTROUGH 14.5  VANCOPEAK --  VANCORANDOM --  GENTTROUGH --  GENTPEAK --  GENTRANDOM --  TOBRATROUGH --  TOBRAPEAK --  TOBRARND --  AMIKACINPEAK --  AMIKACINTROU --  AMIKACIN --     Microbiology: Recent Results (from the past 720 hour(s))  CULTURE, BLOOD (ROUTINE X 2)     Status: Normal (Preliminary result)   Collection Time   09/24/11  5:25 AM      Component Value Range Status Comment   Specimen Description BLOOD RIGHT HAND   Final    Special Requests BOTTLES DRAWN AEROBIC ONLY 4 CC   Final    Culture  Setup Time 09/24/2011 11:08   Final    Culture     Final    Value:        BLOOD CULTURE RECEIVED NO GROWTH TO DATE CULTURE WILL BE HELD FOR 5 DAYS BEFORE ISSUING A FINAL NEGATIVE REPORT   Report Status PENDING   Incomplete   CULTURE, BLOOD (ROUTINE X 2)     Status: Normal (Preliminary result)   Collection Time   09/24/11  5:25 AM      Component Value Range Status Comment   Specimen Description BLOOD LEFT HAND   Final    Special Requests BOTTLES DRAWN AEROBIC AND ANAEROBIC  3 CC EACH   Final    Culture  Setup Time 09/24/2011 11:08   Final    Culture     Final    Value:        BLOOD CULTURE RECEIVED NO GROWTH TO DATE CULTURE WILL BE HELD FOR 5 DAYS BEFORE ISSUING A FINAL NEGATIVE REPORT   Report Status PENDING   Incomplete   MRSA PCR SCREENING     Status: Abnormal   Collection Time   09/24/11  8:28 AM      Component Value Range Status Comment   MRSA by PCR POSITIVE (*) NEGATIVE Final   BODY FLUID CULTURE     Status: Normal (Preliminary result)   Collection Time   09/24/11  3:24 PM      Component Value Range Status Comment   Specimen Description PLEURAL LEFT FLUID   Final    Special Requests TUBE 3   Final    Gram Stain     Final    Value: FEW WBC PRESENT, PREDOMINANTLY PMN     NO ORGANISMS SEEN   Culture NO GROWTH 2 DAYS   Final    Report Status PENDING   Incomplete   CULTURE, RESPIRATORY     Status: Normal   Collection Time   09/24/11  6:35 PM  Component Value Range Status Comment   Specimen Description TRACHEAL ASPIRATE   Final    Special Requests NONE   Final    Gram Stain     Final    Value: FEW WBC PRESENT,BOTH PMN AND MONONUCLEAR     NO SQUAMOUS EPITHELIAL CELLS SEEN     NO ORGANISMS SEEN   Culture Non-Pathogenic Oropharyngeal-type Flora Isolated.   Final    Report Status 09/26/2011 FINAL   Final     Anti-infectives     Start     Dose/Rate Route Frequency Ordered Stop   09/25/11 1000   azithromycin (ZITHROMAX) tablet 500 mg  Status:  Discontinued        500 mg Oral Daily 09/24/11 0806 09/25/11 0849   09/25/11 0900   azithromycin (ZITHROMAX) 500 mg in dextrose 5 % 250 mL IVPB        500 mg 250 mL/hr over 60 Minutes Intravenous Every 24 hours 09/25/11 0849 09/28/11 0859   09/25/11 0100   vancomycin (VANCOCIN) IVPB 1000 mg/200 mL premix        1,000 mg 200 mL/hr over 60 Minutes Intravenous Every 12 hours 09/24/11 1557     09/24/11 1545   vancomycin (VANCOCIN) IVPB 1000 mg/200 mL premix        1,000 mg 200 mL/hr over 60 Minutes  Intravenous Every 12 hours 09/24/11 1540 09/24/11 1745   09/24/11 1200   vancomycin (VANCOCIN) 1,500 mg in sodium chloride 0.9 % 500 mL IVPB        1,500 mg 250 mL/hr over 120 Minutes Intravenous 60 min pre-op 09/24/11 1100 09/24/11 1256   09/24/11 0530   cefTRIAXone (ROCEPHIN) 1 g in dextrose 5 % 50 mL IVPB        1 g 100 mL/hr over 30 Minutes Intravenous Every 24 hours 09/24/11 0526 10/01/11 0529   09/24/11 0530   azithromycin (ZITHROMAX) tablet 500 mg  Status:  Discontinued        500 mg Oral Every 24 hours 09/24/11 0526 09/24/11 0806   09/24/11 0430   cefTRIAXone (ROCEPHIN) 1 g in dextrose 5 % 50 mL IVPB  Status:  Discontinued        1 g 100 mL/hr over 30 Minutes Intravenous  Once 09/24/11 0429 09/24/11 0526   09/24/11 0430   azithromycin (ZITHROMAX) 500 mg in dextrose 5 % 250 mL IVPB  Status:  Discontinued        500 mg 250 mL/hr over 60 Minutes Intravenous Every 24 hours 09/24/11 0429 09/24/11 0526          Assessment: 41 yo F presented w/ SOB, fever, and cough. Received VATS on 7/28 for acute L empyema, chest tube currently in for suctioning. Vanc trough came back today at 14.5. All cultures are ngtd.   Goal of Therapy:  Vancomycin trough level 15-20 mcg/ml  Plan:  - Continue vancomycin 1000mg  IV q12h - Monitor renal function closely, SCr has been up trending. - F/u cultures   Vania Rea. Darin Engels.D. Clinical Pharmacist Pager [phone] Phone [phone] 09/26/2011 3:29 PM

## 2011-09-27 ENCOUNTER — Inpatient Hospital Stay (HOSPITAL_COMMUNITY): Payer: Medicare Other

## 2011-09-27 LAB — CBC WITH DIFFERENTIAL/PLATELET
Basophils Absolute: 0 10*3/uL (ref 0.0–0.1)
Basophils Relative: 0 % (ref 0–1)
Eosinophils Absolute: 0 10*3/uL (ref 0.0–0.7)
MCH: 34.2 pg — ABNORMAL HIGH (ref 26.0–34.0)
MCHC: 33.1 g/dL (ref 30.0–36.0)
Neutro Abs: 11 10*3/uL — ABNORMAL HIGH (ref 1.7–7.7)
Neutrophils Relative %: 92 % — ABNORMAL HIGH (ref 43–77)
Platelets: 241 10*3/uL (ref 150–400)

## 2011-09-27 LAB — GLUCOSE, CAPILLARY
Glucose-Capillary: 139 mg/dL — ABNORMAL HIGH (ref 70–99)
Glucose-Capillary: 166 mg/dL — ABNORMAL HIGH (ref 70–99)
Glucose-Capillary: 176 mg/dL — ABNORMAL HIGH (ref 70–99)
Glucose-Capillary: 233 mg/dL — ABNORMAL HIGH (ref 70–99)

## 2011-09-27 LAB — COMPREHENSIVE METABOLIC PANEL
ALT: 40 U/L — ABNORMAL HIGH (ref 0–35)
AST: 14 U/L (ref 0–37)
CO2: 22 mEq/L (ref 19–32)
Calcium: 7.5 mg/dL — ABNORMAL LOW (ref 8.4–10.5)
Chloride: 103 mEq/L (ref 96–112)
Creatinine, Ser: 0.77 mg/dL (ref 0.50–1.10)
GFR calc Af Amer: >90 mL/min (ref >90)
GFR calc non Af Amer: >90 mL/min (ref >90)
Glucose, Bld: 236 mg/dL — ABNORMAL HIGH (ref 70–99)
Sodium: 135 mEq/L (ref 135–145)
Total Bilirubin: 0.2 mg/dL — ABNORMAL LOW (ref 0.3–1.2)

## 2011-09-27 LAB — PRO B NATRIURETIC PEPTIDE: Pro B Natriuretic peptide (BNP): 322.8 pg/mL — ABNORMAL HIGH (ref 0–125)

## 2011-09-27 LAB — PROCALCITONIN: Procalcitonin: 0.26 ng/mL

## 2011-09-27 MED ORDER — FLUOXETINE HCL 20 MG PO CAPS
20.0000 mg | ORAL_CAPSULE | Freq: Three times a day (TID) | ORAL | Status: DC
  Administered 2011-09-27 – 2011-10-01 (×13): 20 mg via ORAL
  Filled 2011-09-27 (×16): qty 1

## 2011-09-27 MED ORDER — METHYLPREDNISOLONE SODIUM SUCC 40 MG IJ SOLR
20.0000 mg | Freq: Three times a day (TID) | INTRAMUSCULAR | Status: DC
  Administered 2011-09-27 – 2011-09-28 (×3): 20 mg via INTRAVENOUS
  Filled 2011-09-27 (×6): qty 0.5

## 2011-09-27 MED ORDER — MEMANTINE HCL 10 MG PO TABS
10.0000 mg | ORAL_TABLET | Freq: Two times a day (BID) | ORAL | Status: DC
  Administered 2011-09-27 – 2011-10-12 (×31): 10 mg via ORAL
  Filled 2011-09-27 (×34): qty 1

## 2011-09-27 MED ORDER — MIDAZOLAM HCL 2 MG/2ML IJ SOLN
2.0000 mg | INTRAMUSCULAR | Status: DC | PRN
  Administered 2011-09-27: 2 mg via INTRAVENOUS
  Administered 2011-09-28: 0.5 mg via INTRAVENOUS
  Administered 2011-09-29 (×2): 2 mg via INTRAVENOUS
  Filled 2011-09-27 (×4): qty 2

## 2011-09-27 MED ORDER — SODIUM CHLORIDE 0.9 % IV SOLN
INTRAVENOUS | Status: DC
  Administered 2011-09-27 – 2011-09-28 (×2): 20 mL via INTRAVENOUS
  Administered 2011-10-01: 10 mL/h via INTRAVENOUS
  Administered 2011-10-06: 02:00:00 via INTRAVENOUS
  Administered 2011-10-09: 20 mL via INTRAVENOUS
  Administered 2011-10-10: 18:00:00 via INTRAVENOUS

## 2011-09-27 MED ORDER — BUSPIRONE HCL 15 MG PO TABS
15.0000 mg | ORAL_TABLET | Freq: Two times a day (BID) | ORAL | Status: DC
  Administered 2011-09-27 – 2011-10-12 (×31): 15 mg via ORAL
  Filled 2011-09-27 (×35): qty 1

## 2011-09-27 MED ORDER — HEPARIN SODIUM (PORCINE) 5000 UNIT/ML IJ SOLN
5000.0000 [IU] | Freq: Three times a day (TID) | INTRAMUSCULAR | Status: DC
  Administered 2011-09-27 – 2011-10-11 (×43): 5000 [IU] via SUBCUTANEOUS
  Filled 2011-09-27 (×49): qty 1

## 2011-09-27 NOTE — Progress Notes (Signed)
Name: [name] MRN: [id number] DOB: 09-Feb-1971    LOS: 3  Referring Provider:  [facility] hospitalist Reason for Referral:  Pneumonia pleural effusion  PULMONARY / CRITICAL CARE MEDICINE  Brief patient description:  41 year old white female Down's patient with pneumonia and pleural effusion, s/p decrot for empyema, post op resp failure.  Events Since Admission: Thoracentesis 09/24/2011>>failed attempt .  Not able to obtain fluid due to loculations. 7/28- decrtication for empyema, reintubated in PACU 7/29- extubated 7/39- reintubated, hypoxia, distress  Current Status: Calm, no fevers  Vital Signs: Temp:  [96.8 F (36 C)-99.3 F (37.4 C)] 96.8 F (36 C) (07/31 0804) Pulse Rate:  [62-128] 64  (07/31 0759) Resp:  [17-31] 20  (07/31 0759) BP: (74-137)/(44-79) 108/55 mmHg (07/31 0759) SpO2:  [87 %-100 %] 95 % (07/31 0759) FiO2 (%):  [49.6 %-80 %] 50 % (07/31 0759) Weight:  [112.855 kg (248 lb 12.8 oz)] 112.855 kg (248 lb 12.8 oz) (07/31 0600)  Physical Examination: General:  Obese Down syndrome pt Neuro: rass -2, nonfocal HEENT: rt ij dressed, ronchi, no stridor Neck:  Coarse left Cardiovascular:  Reg rate and rhythm normal S1-S2 no S3 or S4 no murmur or rub or gallop Lungs:  Coarse left Abdomen:  Soft nontender no organomegaly Musculoskeletal:  No joint deformity noted Skin:  Intact  Principal Problem:  *Empyema of left pleural space Active Problems:  Shortness of breath  PNA (pneumonia)  Leukocytosis  Tachycardia  Transaminitis  Hyperglycemia  Pleural effusion  Acute respiratory failure with hypoxia   ASSESSMENT AND PLAN  PULMONARY  Lab 09/26/11 1024 09/25/11 0930 09/25/11 0345 09/24/11 1637  PHART 7.395 7.346* 7.311* 7.356  PCO2ART 38.0 50.0* 49.4* 41.2  PO2ART 196.0* 57.0* 94.4 62.0*  HCO3 23.4 27.5* 24.2* 23.6  O2SAT 100.0 88.0 97.2 93.0   Ventilator Settings: on Nasal cannulae Vent Mode:  [-] PRVC FiO2 (%):  [49.6 %-80 %] 50 % Set Rate:  [20 bmp]  20 bmp Vt Set:  [500 mL] 500 mL PEEP:  [5 cmH20] 5 cmH20 Plateau Pressure:  [28 cmH20-36 cmH20] 31 cmH20 CXR/CT chest:  Large left pleural effusion with loculations seen on CT scan pcxr 7/30- diffuse left infiltrate slight improved aeration, chest tubes wnl x 2, ngt wnl ETT:  None  A:  Empyema, PNA, post op resp failure, post extubation agitation P:   Slight improved aeration left on pcxr, on minimal vent settings Wean cpap 5 ps 5, goal 2 hrs, unlikely to extubate pcxr in am  Reduce steroids Upright as able  CARDIOVASCULAR  Lab 09/27/11 0415 09/24/11 0753 09/24/11 0525 09/24/11 0259  TROPONINI -- <0.30 -- --  LATICACIDVEN -- -- 1.2 --  PROBNP 322.8* -- -- 207.7*   ECG:  Reviewed Lines: Peripheral IV  A: tachycardia, sinus P:  Ensure TSH - 1.4, continue IV synthroid Had positive balance, goals next 24 hrs even  RENAL  Lab 09/27/11 0415 09/26/11 0427 09/25/11 0400 09/24/11 1110 09/24/11 0752 09/24/11 0259  NA 135 135 134* 138 -- 137  K 3.8 4.1 -- -- -- --  CL 103 102 104 102 -- 100  CO2 22 25 24 25  -- 22  BUN 14 11 11 14  -- 15  CREATININE 0.77 1.10 0.72 0.84 -- 0.87  CALCIUM 7.5* 7.6* 7.1* 8.3* -- 8.3*  MG -- -- -- -- 1.9 --  PHOS -- -- -- -- 3.1 --   Intake/Output      07/30 0701 - 07/31 0700 07/31 0701 - 08/01 0700  P.O.     I.V. (mL/kg) 1963.3 (17.4)    NG/GT 460    IV Piggyback 600    Total Intake(mL/kg) 3023.3 (26.8)    Urine (mL/kg/hr) 825 (0.3)    Emesis/NG output 50    Chest Tube 0    Total Output 875    Net +2148.3          Foley:  None  A:  Hyponatremia improved, ARF improved vs error crt 7/29 P:   kvo Goal even to slight neg Low threshold lasix  GASTROINTESTINAL  Lab 09/27/11 0415 09/26/11 0427 09/24/11 1110 09/24/11 0259  AST 14 26 43* 89*  ALT 40* 70* 189* 234*  ALKPHOS 85 115 148* 172*  BILITOT 0.2* 0.3 0.5 0.6  PROT 5.2* 5.6* 6.6 6.8  ALBUMIN 1.4* 1.8* 2.3* 2.5*    A:  Mild elevations in liver function tests likely due to  sepsis,malnurished P:   Tolerated TF ppi If residuals, then attempt post pyloric  HEMATOLOGIC  Lab 09/27/11 0415 09/26/11 0427 09/25/11 0400 09/24/11 1110 09/24/11 0259  HGB 9.2* 11.4* 10.5* 12.9 13.2  HCT 27.8* 35.2* 31.7* 39.3 39.4  PLT 241 287 248 359 383  INR -- -- -- -- --  APTT -- -- -- 28 --   A:  Mild leukocytosis, anemia, hemoconcetration P:  scd Addition pharmokinetic dvt prevention  INFECTIOUS  Lab 09/27/11 0415 09/26/11 0830 09/26/11 0427 09/25/11 0400 09/24/11 1110 09/24/11 0259  WBC 12.0* -- 20.8* 16.1* 15.8* 15.6*  PROCALCITON 0.26 0.63 -- -- -- --   Cultures: Blood cultures 09/24/2011 Leg ag - neg Pleural culture 7/28>>>  Antibiotics: Rocephin 09/24/2011(CAP) Azithromycin 09/24/2011 (CAP) vanc 7/28>>>  A:  Community acquired pneumonia with empyema, NO fever, hemoconcentrated WBC, no fevers P:   Continue vanc, likely will consider empiric course Dc azithro at stop date ceftriaxone maintain  ENDOCRINE  Lab 09/27/11 0358 09/27/11 0004 09/26/11 2003 09/26/11 1535 09/26/11 1121  GLUCAP 200* 176* 186* 179* 137*   A:  Mild hyperglycemia   P:   Monitor CBGs Dc d5 in fluids, if still remains hyperglycemic then add lantus ssi  NEUROLOGIC  A:  Mild dementia due to Down syndrome, agitation P:  Restart home ssri, namenda WUA Dc versed drip, change to prn May be difficult to mobilize  BEST PRACTICE / DISPOSITION Level of Care:  ICU Primary Service:  PCCM  Consultants: Zigmund Daniel Code Status:  Full Diet:  N.p.o. DVT Px:  scd GI Px:  Protonix Skin Integrity:  Intact Social / Family:  Mother updated at bedside  Ccm time 30 min   Nelda Bucks., M.D. Pulmonary and Critical Care Medicine [facility] Pager: [phone]  09/27/2011, 8:26 AM  Mcarthur Rossetti. Tyson Alias, MD, FACP Pgr: [phone] [place] [facility]

## 2011-09-27 NOTE — Progress Notes (Signed)
40cc IV Versed wasted in sink. Witnessed by Edison Pace, RN

## 2011-09-27 NOTE — Progress Notes (Signed)
   CARDIOTHORACIC SURGERY PROGRESS NOTE   R3 Days Post-Op Procedure(s) (LRB): VIDEO ASSISTED THORACOSCOPY (VATS)/THOROCOTOMY (Left) FLEXIBLE BRONCHOSCOPY (N/A)  Subjective: Sedated on vent  Objective: Vital signs: BP Readings from Last 1 Encounters:  09/27/11 108/55   Pulse Readings from Last 1 Encounters:  09/27/11 64   Resp Readings from Last 1 Encounters:  09/27/11 20   Temp Readings from Last 1 Encounters:  09/27/11 96.8 F (36 C) Oral    Hemodynamics:    Physical Exam:  Rhythm:   sinus  Breath sounds: clear  Heart sounds:  RRR  Incisions:  Dressings dry  Abdomen:  soft  Extremities:  Warm, well perfused  Chest tubes:  Trivial output, no air leak   Intake/Output from previous day: 07/30 0701 - 07/31 0700 In: 3023.3 [I.V.:1963.3; NG/GT:460; IV Piggyback:600] Out: 875 [Urine:825; Emesis/NG output:50] Intake/Output this shift:    Lab Results:  Basename 09/27/11 0415 09/26/11 0427  WBC 12.0* 20.8*  HGB 9.2* 11.4*  HCT 27.8* 35.2*  PLT 241 287   BMET:  Basename 09/27/11 0415 09/26/11 0427  NA 135 135  K 3.8 4.1  CL 103 102  CO2 22 25  GLUCOSE 236* 144*  BUN 14 11  CREATININE 0.77 1.10  CALCIUM 7.5* 7.6*    CBG (last 3)   Basename 09/27/11 0801 09/27/11 0358 09/27/11 0004  GLUCAP 233* 200* 176*   ABG    Component Value Date/Time   PHART 7.395 09/26/2011 1024   HCO3 23.4 09/26/2011 1024   TCO2 25 09/26/2011 1024   ACIDBASEDEF 1.0 09/26/2011 1024   O2SAT 100.0 09/26/2011 1024   CXR: Slightly improved aeration left lung  Assessment/Plan: S/P Procedure(s) (LRB): VIDEO ASSISTED THORACOSCOPY (VATS)/THOROCOTOMY (Left) FLEXIBLE BRONCHOSCOPY (N/A)  VDRF due to hypoxemia with dense consolidation of left lung Recurrent pneumonia S/P left VATS for empyema Lung well-expanded and trivial chest tube output   Will d/c 1 of 2 tubes today  May resume low dose sub Q heparin for DVT prophylaxis  Plan per Dr Tyson Alias and Pulm/CCM  team  [facility] H 09/27/2011 8:39 AM

## 2011-09-28 ENCOUNTER — Encounter (INDEPENDENT_AMBULATORY_CARE_PROVIDER_SITE_OTHER): Payer: Medicare Other | Admitting: Ophthalmology

## 2011-09-28 ENCOUNTER — Inpatient Hospital Stay (HOSPITAL_COMMUNITY): Payer: Medicare Other

## 2011-09-28 LAB — CBC WITH DIFFERENTIAL/PLATELET
HCT: 28.1 % — ABNORMAL LOW (ref 36.0–46.0)
Hemoglobin: 9.1 g/dL — ABNORMAL LOW (ref 12.0–15.0)
Lymphocytes Relative: 3 % — ABNORMAL LOW (ref 12–46)
Lymphs Abs: 0.5 10*3/uL — ABNORMAL LOW (ref 0.7–4.0)
Monocytes Absolute: 0.6 10*3/uL (ref 0.1–1.0)
Monocytes Relative: 4 % (ref 3–12)
Neutro Abs: 16.9 10*3/uL — ABNORMAL HIGH (ref 1.7–7.7)
Neutrophils Relative %: 94 % — ABNORMAL HIGH (ref 43–77)
RBC: 2.73 MIL/uL — ABNORMAL LOW (ref 3.87–5.11)
WBC: 18.1 10*3/uL — ABNORMAL HIGH (ref 4.0–10.5)

## 2011-09-28 LAB — BODY FLUID CULTURE: Culture: NO GROWTH

## 2011-09-28 LAB — COMPREHENSIVE METABOLIC PANEL
ALT: 74 U/L — ABNORMAL HIGH (ref 0–35)
Calcium: 7.9 mg/dL — ABNORMAL LOW (ref 8.4–10.5)
Creatinine, Ser: 0.79 mg/dL (ref 0.50–1.10)
GFR calc Af Amer: >90 mL/min (ref >90)
GFR calc non Af Amer: >90 mL/min (ref >90)
Glucose, Bld: 193 mg/dL — ABNORMAL HIGH (ref 70–99)
Sodium: 136 mEq/L (ref 135–145)
Total Protein: 5.5 g/dL — ABNORMAL LOW (ref 6.0–8.3)

## 2011-09-28 LAB — GLUCOSE, CAPILLARY
Glucose-Capillary: 136 mg/dL — ABNORMAL HIGH (ref 70–99)
Glucose-Capillary: 137 mg/dL — ABNORMAL HIGH (ref 70–99)
Glucose-Capillary: 137 mg/dL — ABNORMAL HIGH (ref 70–99)
Glucose-Capillary: 163 mg/dL — ABNORMAL HIGH (ref 70–99)

## 2011-09-28 MED ORDER — METHYLPREDNISOLONE SODIUM SUCC 40 MG IJ SOLR
10.0000 mg | Freq: Three times a day (TID) | INTRAMUSCULAR | Status: DC
  Administered 2011-09-28 – 2011-09-29 (×3): 10 mg via INTRAVENOUS
  Filled 2011-09-28 (×6): qty 0.25

## 2011-09-28 MED ORDER — FUROSEMIDE 10 MG/ML IJ SOLN
INTRAMUSCULAR | Status: AC
  Filled 2011-09-28: qty 4

## 2011-09-28 MED ORDER — FUROSEMIDE 10 MG/ML IJ SOLN
20.0000 mg | Freq: Two times a day (BID) | INTRAMUSCULAR | Status: DC
  Administered 2011-09-28 (×2): 20 mg via INTRAVENOUS
  Filled 2011-09-28 (×2): qty 2

## 2011-09-28 NOTE — Progress Notes (Signed)
   CARDIOTHORACIC SURGERY PROGRESS NOTE  4 Days Post-Op  S/P Procedure(s) (LRB): VIDEO ASSISTED THORACOSCOPY (VATS)/THOROCOTOMY (Left) FLEXIBLE BRONCHOSCOPY (N/A)  Subjective: Sedated on vent  Objective: Vital signs in last 24 hours: Temp:  [97.6 F (36.4 C)-98.7 F (37.1 C)] 98.3 F (36.8 C) (08/01 0824) Pulse Rate:  [59-107] 107  (08/01 0835) Cardiac Rhythm:  [-] Normal sinus rhythm (08/01 0800) Resp:  [11-22] 20  (08/01 0835) BP: (92-121)/(53-76) 108/70 mmHg (08/01 0835) SpO2:  [92 %-100 %] 92 % (08/01 0835) FiO2 (%):  [40 %-60 %] 60 % (08/01 0835) Weight:  [112.6 kg (248 lb 3.8 oz)] 112.6 kg (248 lb 3.8 oz) (08/01 0200)  Physical Exam:  Rhythm:   sinus  Breath sounds: Fairly clear  Heart sounds:  RRR  Incisions:  Dressings dry  Abdomen:  soft  Extremities:  warm  Chest tube(s):  No output   Intake/Output from previous day: 07/31 0701 - 08/01 0700 In: 2246.4 [I.V.:661.4; NG/GT:885; IV Piggyback:700] Out: 1255 [Urine:1245; Emesis/NG output:10] Intake/Output this shift: Total I/O In: 77.5 [I.V.:22.5; NG/GT:55] Out: 35 [Urine:35]  Lab Results:  [facility] 09/28/11 0430 09/27/11 0415  WBC 18.1* 12.0*  HGB 9.1* 9.2*  HCT 28.1* 27.8*  PLT 255 241   BMET:  Basename 09/28/11 0430 09/27/11 0415  NA 136 135  K 4.0 3.8  CL 102 103  CO2 23 22  GLUCOSE 193* 236*  BUN 21 14  CREATININE 0.79 0.77  CALCIUM 7.9* 7.5*    CBG (last 3)   Basename 09/28/11 0821 09/28/11 0405 09/28/11 0012  GLUCAP 137* 177* 163*    CXR:  Further improved aeration in left lung  Assessment/Plan: S/P Procedure(s) (LRB): VIDEO ASSISTED THORACOSCOPY (VATS)/THOROCOTOMY (Left) FLEXIBLE BRONCHOSCOPY (N/A)  Trivial chest tube output since surgery Will remove last tube Plan per Dr Tyson Alias and Pulm/CCM team  [facility] H 09/28/2011 8:40 AM

## 2011-09-28 NOTE — Progress Notes (Signed)
Name: [name] MRN: [id number] DOB: 1970-09-15    LOS: 4  Referring Provider:  [facility] hospitalist Reason for Referral:  Pneumonia pleural effusion  PULMONARY / CRITICAL CARE MEDICINE  Brief patient description:  41 year old white female Down's patient with pneumonia and pleural effusion, s/p decrot for empyema, post op resp failure.  Events Since Admission: Thoracentesis 09/24/2011>>failed attempt .  Not able to obtain fluid due to loculations. 7/28- decrtication for empyema, reintubated in PACU 7/29- extubated 7/30- reintubated, hypoxia, distress 7/31- removal CT 1  Current Status: hemodynamics are good, pos 1 liter  Vital Signs: Temp:  [97.6 F (36.4 C)-98.7 F (37.1 C)] 98.3 F (36.8 C) (08/01 0824) Pulse Rate:  [59-107] 107  (08/01 0835) Resp:  [11-22] 20  (08/01 0835) BP: (92-121)/(53-76) 108/70 mmHg (08/01 0835) SpO2:  [92 %-100 %] 92 % (08/01 0835) FiO2 (%):  [40 %-60 %] 60 % (08/01 0835) Weight:  [112.6 kg (248 lb 3.8 oz)] 112.6 kg (248 lb 3.8 oz) (08/01 0200)  Physical Examination: General:  Obese Down syndrome pt Neuro: rass -2, nonfocal,. Followed commands HEENT: rt ij dressed Neck:  ETT Cardiovascular:  Reg rate and rhythm normal S1-S2 no S3 or S4 no murmur or rub or gallop Lungs:  Coarse left BS improved Abdomen:  Soft nontender no organomegaly Musculoskeletal:  No joint deformity noted Skin:  Intact  Principal Problem:  *Empyema of left pleural space Active Problems:  Shortness of breath  PNA (pneumonia)  Leukocytosis  Tachycardia  Transaminitis  Hyperglycemia  Pleural effusion  Acute respiratory failure with hypoxia   ASSESSMENT AND PLAN  PULMONARY  Lab 09/26/11 1024 09/25/11 0930 09/25/11 0345 09/24/11 1637  PHART 7.395 7.346* 7.311* 7.356  PCO2ART 38.0 50.0* 49.4* 41.2  PO2ART 196.0* 57.0* 94.4 62.0*  HCO3 23.4 27.5* 24.2* 23.6  O2SAT 100.0 88.0 97.2 93.0   Ventilator Settings: on Nasal cannulae Vent Mode:  [-] PRVC FiO2 (%):   [40 %-60 %] 60 % Set Rate:  [20 bmp] 20 bmp Vt Set:  [0 mL-500 mL] 500 mL PEEP:  [5 cmH20] 5 cmH20 Pressure Support:  [10 cmH20] 10 cmH20 Plateau Pressure:  [26 cmH20-30 cmH20] 26 cmH20 CXR/CT chest:  Large left pleural effusion with loculations seen on CT scan pcxr 8/1- improved aeration left, CT wnl, slight increased bilateral patchy infiltrates? ETT:  None  A:  Empyema, PNA, post op resp failure, post extubation agitation, ards ? pulm edema? P:  Improved left , increased patchy, edema? ARDS? This am required increase to 60% fio2, may require peep increase Consider lasix to neg balance If to 50% then re wean cpap 5 ps 5, may consider exercise to CPAP 8 ps 5 on 60% pcxr in am  Chest tube 2 to dc per cvts Keep plat less 30 Reduce steroids  CARDIOVASCULAR  Lab 09/27/11 0415 09/24/11 0753 09/24/11 0525 09/24/11 0259  TROPONINI -- <0.30 -- --  LATICACIDVEN -- -- 1.2 --  PROBNP 322.8* -- -- 207.7*   ECG:  Reviewed Lines: Peripheral IV  A: tachycardia, sinus, resolve P:  continue IV synthroid Neg balance goal  RENAL  Lab 09/28/11 0430 09/27/11 0415 09/26/11 0427 09/25/11 0400 09/24/11 1110 09/24/11 0752  NA 136 135 135 134* 138 --  K 4.0 3.8 -- -- -- --  CL 102 103 102 104 102 --  CO2 23 22 25 24 25  --  BUN 21 14 11 11 14  --  CREATININE 0.79 0.77 1.10 0.72 0.84 --  CALCIUM 7.9* 7.5* 7.6* 7.1* 8.3* --  MG -- -- -- -- -- 1.9  PHOS -- -- -- -- -- 3.1   Intake/Output      07/31 0701 - 08/01 0700 08/01 0701 - 08/02 0700   I.V. (mL/kg) 661.4 (5.9) 22.5 (0.2)   NG/GT 885 55   IV Piggyback 700    Total Intake(mL/kg) 2246.4 (20) 77.5 (0.7)   Urine (mL/kg/hr) 1245 (0.5) 35   Emesis/NG output 10    Total Output 1255 35   Net +991.4 +42.5         Foley:  None  A:  Hyponatremia improved, ARF improved vs error crt 7/29, pos balance for stay P:   kvo Lasix, goal neg 1 liter  GASTROINTESTINAL  Lab 09/28/11 0430 09/27/11 0415 09/26/11 0427 09/24/11 1110 09/24/11 0259    AST 60* 14 26 43* 89*  ALT 74* 40* 70* 189* 234*  ALKPHOS 93 85 115 148* 172*  BILITOT 0.1* 0.2* 0.3 0.5 0.6  PROT 5.5* 5.2* 5.6* 6.6 6.8  ALBUMIN 1.6* 1.4* 1.8* 2.3* 2.5*    A:  malnurished P:   Tolerated TF ppi Look for BM  HEMATOLOGIC  Lab 09/28/11 0430 09/27/11 0415 09/26/11 0427 09/25/11 0400 09/24/11 1110  HGB 9.1* 9.2* 11.4* 10.5* 12.9  HCT 28.1* 27.8* 35.2* 31.7* 39.3  PLT 255 241 287 248 359  INR -- -- -- -- --  APTT -- -- -- -- 28   A:  Mild leukocytosis, anemia, hemoconcentration although was positive P:  scd Sub q heparin tolerated  INFECTIOUS  Lab 09/28/11 0430 09/27/11 0415 09/26/11 0830 09/26/11 0427 09/25/11 0400 09/24/11 1110  WBC 18.1* 12.0* -- 20.8* 16.1* 15.8*  PROCALCITON -- 0.26 0.63 -- -- --   Cultures: Blood cultures 09/24/2011 Leg ag - neg Pleural culture 7/28>>>  Antibiotics: Rocephin 09/24/2011(CAP)>>>plan 10 days Azithromycin 09/24/2011 (CAP)>>>8/1 vanc 7/28>>>plan stop 8/4  A:  Community acquired pneumonia with empyema, remains culture neg P:   Continue vanc, likely will consider empiric course as appearance of loculations on CT, nares positive, severity of illness, will add stop date Dc azithro, unlikely atypical ceftriaxone maintain, add total 10 days  ENDOCRINE  Lab 09/28/11 0821 09/28/11 0405 09/28/11 0012 09/27/11 1951 09/27/11 1622  GLUCAP 137* 177* 163* 160* 139*   A:  Mild hyperglycemia   P:   Monitor CBGs In goals 140-180 ssi  NEUROLOGIC  A:  Mild dementia due to Down syndrome, agitation P:  home ssri, namenda At risk ser syndrome with fent and ssri, monitor temp WUA versed  prn fent low at 50, WUA Chair position  BEST PRACTICE / DISPOSITION Level of Care:  ICU Primary Service:  PCCM  Consultants: Zigmund Daniel Code Status:  Full Diet:  N.p.o. DVT Px:  scd GI Px:  Protonix Skin Integrity:  Intact Social / Family:    Ccm time 30 min   Nelda Bucks., M.D. Pulmonary and Critical Care  Medicine [facility] Pager: [phone]

## 2011-09-28 NOTE — Progress Notes (Signed)
Nutrition Follow-up  Intervention:    Continue current EN regimen RD to follow for nutrition care plan  Assessment:   Patient is currently intubated on ventilator support.  MV: 7.7 Temp: 36.8 C  Oxepa formula infusing at 25 ml/hr via OGT with Prostat liquid protine 4 times daiy via tube providing 1300 kcals, 98 gm protein, 471 ml of free water -- tolerating well. Receiving liquid MVI daily to help meet RDI's. Hyponatremia and acute renal failure improved.  Diet Order:  NPO  Meds: Scheduled Meds:   . antiseptic oral rinse  15 mL Mouth Rinse QID  . bisacodyl  10 mg Oral Daily  . busPIRone  15 mg Oral BID  . cefTRIAXone (ROCEPHIN)  IV  1 g Intravenous Q24H  . chlorhexidine  15 mL Mouth Rinse BID  . Chlorhexidine Gluconate Cloth  6 each Topical Q0600  . feeding supplement  30 mL Per Tube QID  . FLUoxetine  20 mg Oral TID  . furosemide      . furosemide  20 mg Intravenous Q12H  . heparin subcutaneous  5,000 Units Subcutaneous Q8H  . insulin aspart  0-24 Units Subcutaneous Q4H  . levothyroxine  50 mcg Intravenous Daily  . memantine  10 mg Oral BID  . methylPREDNISolone (SOLU-MEDROL) injection  10 mg Intravenous Q8H  . midazolam  3.5 mg Intravenous Once  . multivitamin  5 mL Per Tube Daily  . mupirocin ointment   Nasal BID  . pantoprazole (PROTONIX) IV  40 mg Intravenous Q24H  . vancomycin  1,000 mg Intravenous Q12H  . DISCONTD: methylPREDNISolone (SOLU-MEDROL) injection  20 mg Intravenous Q8H   Continuous Infusions:   . sodium chloride 20 mL (09/27/11 0900)  . feeding supplement (OXEPA) 1,000 mL (09/28/11 0227)  . fentaNYL infusion INTRAVENOUS Stopped (09/25/11 0900)  . fentaNYL infusion INTRAVENOUS 50 mcg/hr (09/28/11 0900)   PRN Meds:.acetaminophen (TYLENOL) oral liquid 160 mg/5 mL, fentaNYL, fentaNYL, haloperidol lactate, ipratropium, midazolam, midazolam, ondansetron (ZOFRAN) IV, potassium chloride, senna-docusate, DISCONTD:  HYDROmorphone (DILAUDID) injection, DISCONTD:  ondansetron (ZOFRAN) IV, DISCONTD: oxyCODONE-acetaminophen  Labs:  CMP     Component Value Date/Time   NA 136 09/28/2011 0430   K 4.0 09/28/2011 0430   CL 102 09/28/2011 0430   CO2 23 09/28/2011 0430   GLUCOSE 193* 09/28/2011 0430   BUN 21 09/28/2011 0430   CREATININE 0.79 09/28/2011 0430   CALCIUM 7.9* 09/28/2011 0430   PROT 5.5* 09/28/2011 0430   ALBUMIN 1.6* 09/28/2011 0430   AST 60* 09/28/2011 0430   ALT 74* 09/28/2011 0430   ALKPHOS 93 09/28/2011 0430   BILITOT 0.1* 09/28/2011 0430   GFRNONAA >90 09/28/2011 0430   GFRAA >90 09/28/2011 0430     Intake/Output Summary (Last 24 hours) at 09/28/11 1112 Last data filed at 09/28/11 1100  Gross per 24 hour  Intake 1902.5 ml  Output   1290 ml  Net  612.5 ml    CBG (last 3)   Basename 09/28/11 0821 09/28/11 0405 09/28/11 0012  GLUCAP 137* 177* 163*    Weight Status:  112.6 kg (8/1) -- stable  Re-estimated needs:  1900 kcals, 90-100 gm protein  Nutrition Dx:  Inadequate Oral Intake r/t inability to eat as evidenced by NPO status, ongoing  Goal:  EN to provide 60-70% of estimated calorie needs (22-25 kcals/kg ideal body weight) and >/= 90% of estimated protein needs, based on ASPEN guidelines for permissive underfeeding in critically ill obese individuals, met  Monitor:  EN tolerance & regimen, respiratory status,  weight, labs, I/O's  Kirkland Hun, RD, LDN Pager #: [phone] After-Hours Pager #: [phone]

## 2011-09-29 ENCOUNTER — Inpatient Hospital Stay (HOSPITAL_COMMUNITY): Payer: Medicare Other

## 2011-09-29 LAB — CBC WITH DIFFERENTIAL/PLATELET
Eosinophils Absolute: 0 10*3/uL (ref 0.0–0.7)
Hemoglobin: 10.6 g/dL — ABNORMAL LOW (ref 12.0–15.0)
Lymphocytes Relative: 6 % — ABNORMAL LOW (ref 12–46)
Lymphs Abs: 1.5 10*3/uL (ref 0.7–4.0)
MCH: 33.3 pg (ref 26.0–34.0)
MCV: 103.1 fL — ABNORMAL HIGH (ref 78.0–100.0)
Monocytes Relative: 6 % (ref 3–12)
Neutrophils Relative %: 89 % — ABNORMAL HIGH (ref 43–77)
Platelets: 302 10*3/uL (ref 150–400)
RBC: 3.18 MIL/uL — ABNORMAL LOW (ref 3.87–5.11)
WBC: 25.7 10*3/uL — ABNORMAL HIGH (ref 4.0–10.5)

## 2011-09-29 LAB — BASIC METABOLIC PANEL
BUN: 28 mg/dL — ABNORMAL HIGH (ref 6–23)
CO2: 29 mEq/L (ref 19–32)
Chloride: 104 mEq/L (ref 96–112)
Creatinine, Ser: 0.94 mg/dL (ref 0.50–1.10)
GFR calc Af Amer: 86 mL/min — ABNORMAL LOW (ref >90)
Potassium: 3.8 mEq/L (ref 3.5–5.1)

## 2011-09-29 LAB — GLUCOSE, CAPILLARY
Glucose-Capillary: 121 mg/dL — ABNORMAL HIGH (ref 70–99)
Glucose-Capillary: 142 mg/dL — ABNORMAL HIGH (ref 70–99)

## 2011-09-29 LAB — PHOSPHORUS: Phosphorus: 2.5 mg/dL (ref 2.3–4.6)

## 2011-09-29 MED ORDER — FUROSEMIDE 10 MG/ML IJ SOLN
10.0000 mg | Freq: Two times a day (BID) | INTRAMUSCULAR | Status: DC
  Administered 2011-09-29: 10 mg via INTRAVENOUS

## 2011-09-29 MED ORDER — LIDOCAINE 5 % EX PTCH
1.0000 | MEDICATED_PATCH | CUTANEOUS | Status: DC
Start: 2011-09-29 — End: 2011-10-12
  Administered 2011-09-29 – 2011-10-11 (×5): 1 via TRANSDERMAL
  Filled 2011-09-29 (×14): qty 1

## 2011-09-29 MED ORDER — POTASSIUM CHLORIDE 10 MEQ/50ML IV SOLN
10.0000 meq | INTRAVENOUS | Status: AC
  Administered 2011-09-29 (×2): 10 meq via INTRAVENOUS

## 2011-09-29 MED ORDER — MIDAZOLAM HCL 5 MG/ML IJ SOLN
2.0000 mg/h | INTRAMUSCULAR | Status: DC
  Administered 2011-09-29: 2 mg/h via INTRAVENOUS
  Administered 2011-09-29: 4 mg/h via INTRAVENOUS
  Filled 2011-09-29 (×3): qty 10

## 2011-09-29 NOTE — Progress Notes (Signed)
   CARDIOTHORACIC SURGERY PROGRESS NOTE   R5 Days Post-Op Procedure(s) (LRB): VIDEO ASSISTED THORACOSCOPY (VATS)/THOROCOTOMY (Left) FLEXIBLE BRONCHOSCOPY (N/A)  Subjective: arousable on vent, intermittently agitated  Objective: Vital signs: BP Readings from Last 1 Encounters:  09/29/11 96/55   Pulse Readings from Last 1 Encounters:  09/29/11 92   Resp Readings from Last 1 Encounters:  09/29/11 20   Temp Readings from Last 1 Encounters:  09/29/11 99.3 F (37.4 C) Oral    Hemodynamics:    Physical Exam:  Rhythm:   sinus  Breath sounds: Few rhonchi  Heart sounds:  RRR  Incisions:  Dressings dry  Abdomen:  soft  Extremities:  warm   Intake/Output from previous day: 08/01 0701 - 08/02 0700 In: 1850 [I.V.:598; NG/GT:800; IV Piggyback:452] Out: 4535 [Urine:4435; Stool:100] Intake/Output this shift:    Lab Results:  Basename 09/29/11 0500 09/28/11 0430  WBC 25.7* 18.1*  HGB 10.6* 9.1*  HCT 32.8* 28.1*  PLT 302 255   BMET:  Basename 09/29/11 0500 09/28/11 0430  NA 143 136  K 3.8 4.0  CL 104 102  CO2 29 23  GLUCOSE 138* 193*  BUN 28* 21  CREATININE 0.94 0.79  CALCIUM 8.2* 7.9*    CBG (last 3)   Basename 09/29/11 0423 09/29/11 0039 09/28/11 1955  GLUCAP 142* 121* 136*   ABG    Component Value Date/Time   PHART 7.395 09/26/2011 1024   HCO3 23.4 09/26/2011 1024   TCO2 25 09/26/2011 1024   ACIDBASEDEF 1.0 09/26/2011 1024   O2SAT 100.0 09/26/2011 1024   CXR: *RADIOLOGY REPORT*  Clinical Data: Evaluate lung infiltrates  PORTABLE CHEST - 1 VIEW  Comparison: Portable chest x-ray of 09/28/2011  Findings: There is little change in poor aeration and diffuse  airspace disease. Cardiomegaly is stable. The tip of the  endotracheal tube appears to be lower, only 8 mm above the carina.  The right central venous line tip is in the upper SVC - right  innominate vein. Cardiomegaly is stable. The left chest tube is  no longer seen.  IMPRESSION:  1. Left chest  tube removed. No change in poor aeration. No  pneumothorax.  2. The tip of the endotracheal tube appears to be lower, only 8 mm  above the carina.  Original Report Authenticated By: Juline Patch, M.D.   Assessment/Plan: S/P Procedure(s) (LRB): VIDEO ASSISTED THORACOSCOPY (VATS)/THOROCOTOMY (Left) FLEXIBLE BRONCHOSCOPY (N/A)  Overall stable from surgical standpoint and both chest tubes out Plan per Dr Tyson Alias and Pulm/CCM team I will be out of town next week - my partners will cover  [facility] H 09/29/2011 8:42 AM

## 2011-09-29 NOTE — Progress Notes (Signed)
CRITICAL VALUE ALERT  Critical value received:  Positive Blood Cx-gram + rods in anaerobic bottle  Date of notification:  09/29/11  Time of notification:  0230  Critical value read back:yes  Nurse who received alert:  Okey Dupre, RN  MD notified (1st page):  Dr. Craige Cotta  Time of first page:  0230  MD notified (2nd page):  Time of second page:  Responding MD:  Dr. Craige Cotta  Time MD responded:  0230

## 2011-09-29 NOTE — Progress Notes (Signed)
ANTIBIOTIC CONSULT NOTE - FOLLOW UP  Pharmacy Consult for Vancomycin Indication: pneumonia  No Known Allergies  Labs:  Basename 09/29/11 0500 09/28/11 0430 09/27/11 0415  WBC 25.7* 18.1* 12.0*  HGB 10.6* 9.1* 9.2*  PLT 302 255 241  LABCREA -- -- --  CREATININE 0.94 0.79 0.77   Estimated Creatinine Clearance: 86.8 ml/min (by C-G formula based on Cr of 0.94).  Basename 09/26/11 1230  VANCOTROUGH 14.5  VANCOPEAK --  VANCORANDOM --  GENTTROUGH --  GENTPEAK --  GENTRANDOM --  TOBRATROUGH --  TOBRAPEAK --  TOBRARND --  AMIKACINPEAK --  AMIKACINTROU --  AMIKACIN --    Assessment: 41 yo F presented w/ SOB, fever, and cough. Received VATS on 7/28 for acute L empyema Blood culture with GPR  Goal of Therapy:  Vancomycin trough level 15-20 mcg/ml  Plan:  - Continue vancomycin 1000mg  IV q12h through 8.4.13 - Monitor renal function closely, SCr has been up trending. - F/u cultures  Okey Regal, PharmD [phone]  09/29/2011 10:49 AM

## 2011-09-29 NOTE — Progress Notes (Signed)
Name: [name] MRN: [id number] DOB: Sep 03, 1970    LOS: 5  Referring Provider:  [facility] hospitalist Reason for Referral:  Pneumonia pleural effusion  PULMONARY / CRITICAL CARE MEDICINE  Brief patient description:  41 year old white female Down's patient with pneumonia and pleural effusion, s/p decrot for empyema, post op resp failure.  Events Since Admission: Thoracentesis 09/24/2011>>failed attempt .  Not able to obtain fluid due to loculations. 7/28- decrtication for empyema, reintubated in PACU 7/29- extubated 7/30- reintubated, hypoxia, distress 7/31- removal CT 1  Current Status: Improved O2 needs, neg balance  Vital Signs: Temp:  [98 F (36.7 C)-98.8 F (37.1 C)] 98.7 F (37.1 C) (08/02 0347) Pulse Rate:  [75-126] 92  (08/02 0700) Resp:  [13-29] 20  (08/02 0700) BP: (81-124)/(44-82) 96/55 mmHg (08/02 0700) SpO2:  [89 %-99 %] 98 % (08/02 0700) FiO2 (%):  [49.6 %-60.2 %] 49.9 % (08/02 0818) Weight:  [109.7 kg (241 lb 13.5 oz)] 109.7 kg (241 lb 13.5 oz) (08/02 0500)  Physical Examination: General:  Obese Down syndrome pt in chair position Neuro: rass -1, nonfocal,. Followed commands HEENT: rt ij dressed Neck:  ETT Cardiovascular:  Reg rate and rhythm normal S1-S2 no S3 or S4 no murmur or rub or gallop Lungs:  Coarse improved Abdomen:  Soft nontender no organomegaly Musculoskeletal:  No joint deformity noted Skin:  Intact  Principal Problem:  *Empyema of left pleural space Active Problems:  Shortness of breath  PNA (pneumonia)  Leukocytosis  Tachycardia  Transaminitis  Hyperglycemia  Pleural effusion  Acute respiratory failure with hypoxia   ASSESSMENT AND PLAN  PULMONARY  Lab 09/26/11 1024 09/25/11 0930 09/25/11 0345 09/24/11 1637  PHART 7.395 7.346* 7.311* 7.356  PCO2ART 38.0 50.0* 49.4* 41.2  PO2ART 196.0* 57.0* 94.4 62.0*  HCO3 23.4 27.5* 24.2* 23.6  O2SAT 100.0 88.0 97.2 93.0   Ventilator Settings: on Nasal cannulae Vent Mode:  [-]  CPAP FiO2 (%):  [49.6 %-60.2 %] 49.9 % Set Rate:  [20 bmp] 20 bmp Vt Set:  [500 mL] 500 mL PEEP:  [5 cmH20-5.7 cmH20] 5.7 cmH20 Pressure Support:  [5 cmH20] 5 cmH20 Plateau Pressure:  [16 cmH20-28 cmH20] 27 cmH20 CXR/CT chest:  Large left pleural effusion with loculations seen on CT scan pcxr 8/1- improved aeration left, CT wnl, slight increased bilateral patchy infiltrates? ETT:  None  A:  Empyema, PNA, post op resp failure, post extubation agitation, ards ? pulm edema? P:  Weaning this am cpap 5 ps 5, some desaturation noted ARDS ? Patchy infiltrates despite neg balance pcxr in am  Can increase o2 to 50% with weaning Dc steroids,no role further Lasix continue to neg 1 lit goal Adjust ett out 1.5 cm  CARDIOVASCULAR  Lab 09/27/11 0415 09/24/11 0753 09/24/11 0525 09/24/11 0259  TROPONINI -- <0.30 -- --  LATICACIDVEN -- -- 1.2 --  PROBNP 322.8* -- -- 207.7*   ECG:  Reviewed Lines: Peripheral IV  A: tachycardia, sinus, resolve P:  continue IV synthroid Neg balance tolerated, some tachy, reduce  RENAL  Lab 09/29/11 0500 09/28/11 0430 09/27/11 0415 09/26/11 0427 09/25/11 0400 09/24/11 0752  NA 143 136 135 135 134* --  K 3.8 4.0 -- -- -- --  CL 104 102 103 102 104 --  CO2 29 23 22 25 24  --  BUN 28* 21 14 11 11  --  CREATININE 0.94 0.79 0.77 1.10 0.72 --  CALCIUM 8.2* 7.9* 7.5* 7.6* 7.1* --  MG 2.1 -- -- -- -- 1.9  PHOS 2.5 -- -- -- --  3.1   Intake/Output      08/01 0701 - 08/02 0700 08/02 0701 - 08/03 0700   I.V. (mL/kg) 598 (5.5)    NG/GT 800    IV Piggyback 452    Total Intake(mL/kg) 1850 (16.9)    Urine (mL/kg/hr) 4435 (1.7)    Emesis/NG output     Stool 100    Total Output 4535    Net -2685         Stool Occurrence 3 x     Foley:  None  A:  Hyponatremia improved, ARF improved vs error crt 7/29 P:   kvo Lasix to 10 big k supp Chem in am   GASTROINTESTINAL  Lab 09/28/11 0430 09/27/11 0415 09/26/11 0427 09/24/11 1110 09/24/11 0259  AST 60* 14 26 43*  89*  ALT 74* 40* 70* 189* 234*  ALKPHOS 93 85 115 148* 172*  BILITOT 0.1* 0.2* 0.3 0.5 0.6  PROT 5.5* 5.2* 5.6* 6.6 6.8  ALBUMIN 1.6* 1.4* 1.8* 2.3* 2.5*    A:  malnurished P:   Tolerated TF, continue if not extubated ppi  HEMATOLOGIC  Lab 09/29/11 0500 09/28/11 0430 09/27/11 0415 09/26/11 0427 09/25/11 0400 09/24/11 1110  HGB 10.6* 9.1* 9.2* 11.4* 10.5* --  HCT 32.8* 28.1* 27.8* 35.2* 31.7* --  PLT 302 255 241 287 248 --  INR -- -- -- -- -- --  APTT -- -- -- -- -- 28   A:  Mild leukocytosis, anemia, hemoconcentration although was positive P:  scd Sub q heparin tolerated  INFECTIOUS  Lab 09/29/11 0500 09/28/11 0430 09/27/11 0415 09/26/11 0830 09/26/11 0427 09/25/11 0400  WBC 25.7* 18.1* 12.0* -- 20.8* 16.1*  PROCALCITON -- -- 0.26 0.63 -- --   Cultures: Blood cultures 09/24/2011>>>gram pos rod>>>contam  likely Leg ag - neg Pleural culture 7/28>>>  Antibiotics: Rocephin 09/24/2011(CAP)>>>plan 10 days Azithromycin 09/24/2011 (CAP)>>>8/1 vanc 7/28>>>plan stop 8/4  A:  Community acquired pneumonia with empyema, remains culture neg P:   Continue vanc, likely will consider empiric course as appearance of loculations on CT, nares positive, severity of illness, will add stop date ceftriaxone maintain, add total 10 days  ENDOCRINE  Lab 09/29/11 0423 09/29/11 0039 09/28/11 1955 09/28/11 1716 09/28/11 1153  GLUCAP 142* 121* 136* 137* 135*   A:  Mild hyperglycemia   P:   Monitor CBGs In goals 140-180 ssi  NEUROLOGIC  A:  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 DVT Px:  scd GI Px:  Protonix Skin Integrity:  Intact Social / Family:    Ccm time 30 min    Nelda Bucks., M.D. Pulmonary and Critical Care Medicine [facility] Pager: [phone]

## 2011-09-30 ENCOUNTER — Inpatient Hospital Stay (HOSPITAL_COMMUNITY): Payer: Medicare Other

## 2011-09-30 LAB — CBC WITH DIFFERENTIAL/PLATELET
Eosinophils Absolute: 0 10*3/uL (ref 0.0–0.7)
Eosinophils Relative: 0 % (ref 0–5)
Hemoglobin: 8.7 g/dL — ABNORMAL LOW (ref 12.0–15.0)
Lymphocytes Relative: 8 % — ABNORMAL LOW (ref 12–46)
Lymphs Abs: 1 10*3/uL (ref 0.7–4.0)
MCH: 33.3 pg (ref 26.0–34.0)
MCV: 101.9 fL — ABNORMAL HIGH (ref 78.0–100.0)
Monocytes Relative: 6 % (ref 3–12)
Neutrophils Relative %: 85 % — ABNORMAL HIGH (ref 43–77)
Platelets: 233 10*3/uL (ref 150–400)
RBC: 2.61 MIL/uL — ABNORMAL LOW (ref 3.87–5.11)
WBC: 13 10*3/uL — ABNORMAL HIGH (ref 4.0–10.5)

## 2011-09-30 LAB — BLOOD GAS, ARTERIAL
Acid-Base Excess: 4.2 mmol/L — ABNORMAL HIGH (ref 0.0–2.0)
Drawn by: 36496
FIO2: 0.6 %
MECHVT: 500 mL
PEEP: 5 cmH2O
RATE: 20 resp/min
pCO2 arterial: 44.1 mmHg (ref 35.0–45.0)
pH, Arterial: 7.425 (ref 7.350–7.450)

## 2011-09-30 LAB — CULTURE, BLOOD (ROUTINE X 2): Culture: NO GROWTH

## 2011-09-30 LAB — GLUCOSE, CAPILLARY: Glucose-Capillary: 167 mg/dL — ABNORMAL HIGH (ref 70–99)

## 2011-09-30 LAB — BASIC METABOLIC PANEL
BUN: 25 mg/dL — ABNORMAL HIGH (ref 6–23)
CO2: 31 mEq/L (ref 19–32)
GFR calc non Af Amer: 76 mL/min — ABNORMAL LOW (ref >90)
Glucose, Bld: 150 mg/dL — ABNORMAL HIGH (ref 70–99)
Potassium: 3.5 mEq/L (ref 3.5–5.1)
Sodium: 141 mEq/L (ref 135–145)

## 2011-09-30 LAB — MAGNESIUM: Magnesium: 1.9 mg/dL (ref 1.5–2.5)

## 2011-09-30 MED ORDER — POTASSIUM PHOSPHATE DIBASIC 3 MMOLE/ML IV SOLN
30.0000 mmol | Freq: Once | INTRAVENOUS | Status: AC
  Administered 2011-09-30: 30 mmol via INTRAVENOUS
  Filled 2011-09-30: qty 10

## 2011-09-30 MED ORDER — PANTOPRAZOLE SODIUM 40 MG PO PACK
40.0000 mg | PACK | Freq: Every day | ORAL | Status: DC
  Administered 2011-10-01 – 2011-10-12 (×12): 40 mg
  Filled 2011-09-30 (×12): qty 20

## 2011-09-30 NOTE — Progress Notes (Signed)
Name: [name] MRN: [id number] DOB: 1970/08/19    LOS: 6  Referring Provider:  [facility] Reason for Referral:  Postop respiratory failure  PULMONARY / CRITICAL CARE MEDICINE  Brief patient description:  41 year old white female Down's patient with pneumonia and pleural effusion, s/p decrotication for empyema, post op resp failure.  Events Since Admission: 7/28  Failed thoracentesis due to loculations, decrtication for empyema, reintubated in PACU 7/29  Extubated 7/30  Reintubated, hypoxia, distress 7/31  Removal chest tube  Current Status:  Mucus plug this am.  Increased oxygen requirements.  Vital Signs: Temp:  [99.7 F (37.6 C)-102.7 F (39.3 C)] 101 F (38.3 C) (08/03 1217) Pulse Rate:  [92-122] 96  (08/03 1300) Resp:  [17-26] 20  (08/03 1300) BP: (82-120)/(39-65) 102/59 mmHg (08/03 1300) SpO2:  [94 %-100 %] 95 % (08/03 1300) FiO2 (%):  [39.8 %-100 %] 59.8 % (08/03 1300)  Physical Examination: General:  Ventilated, in no distress Neuro: Non focal, nonverbally responsive  HEENT: PERRL Neck:  No JVD Cardiovascular:  RRR Lungs:  Bilateral diminished air entry, few rhonchi Abdomen:  Soft nontender no organomegaly Musculoskeletal:  Moves all extremities Skin:  Intact  Principal Problem:  *Empyema of left pleural space Active Problems:  Shortness of breath  PNA (pneumonia)  Leukocytosis  Tachycardia  Transaminitis  Hyperglycemia  Pleural effusion  Acute respiratory failure with hypoxia  ASSESSMENT AND PLAN  PULMONARY  Lab 09/26/11 1024 09/25/11 0930 09/25/11 0345 09/24/11 1637  PHART 7.395 7.346* 7.311* 7.356  PCO2ART 38.0 50.0* 49.4* 41.2  PO2ART 196.0* 57.0* 94.4 62.0*  HCO3 23.4 27.5* 24.2* 23.6  O2SAT 100.0 88.0 97.2 93.0   Ventilator Settings: on Nasal cannulae Vent Mode:  [-] PRVC FiO2 (%):  [39.8 %-100 %] 59.8 % Set Rate:  [20 bmp] 20 bmp Vt Set:  [500 mL] 500 mL PEEP:  [5 cmH20] 5 cmH20 Pressure Support:  [15 cmH20] 15 cmH20 Plateau Pressure:   [22 cmH20-30 cmH20] 26 cmH20  CXR:  8/3 >>> Bilateral airspace disease ETT:   7/28 >>> 7/29 7/30 >>>  A:  Empyema s/p decortication.  Pneumonia.  Acute respiratory failure. P:   Full mechanical support SBT daily  CARDIOVASCULAR  Lab 09/27/11 0415 09/24/11 0753 09/24/11 0525 09/24/11 0259  TROPONINI -- <0.30 -- --  LATICACIDVEN -- -- 1.2 --  PROBNP 322.8* -- -- 207.7*   ECG:  Reviewed Lines: Peripheral IV  A: Hemodynamically stable.  No arrhythmia / ischemia. P:  Telemetry  RENAL  Lab 09/30/11 0432 09/29/11 0500 09/28/11 0430 09/27/11 0415 09/26/11 0427 09/24/11 0752  NA 141 143 136 135 135 --  K 3.5 3.8 -- -- -- --  CL 104 104 102 103 102 --  CO2 31 29 23 22 25  --  BUN 25* 28* 21 14 11  --  CREATININE 0.92 0.94 0.79 0.77 1.10 --  CALCIUM 7.5* 8.2* 7.9* 7.5* 7.6* --  MG 1.9 2.1 -- -- -- 1.9  PHOS 1.5* 2.5 -- -- -- 3.1   Intake/Output      08/02 0701 - 08/03 0700 08/03 0701 - 08/04 0700   I.V. (mL/kg) 696.5 (6.3) 146.5 (1.3)   NG/GT 710 125   IV Piggyback 300    Total Intake(mL/kg) 1706.5 (15.6) 271.5 (2.5)   Urine (mL/kg/hr) 2340 (0.9) 50 (0.1)   Stool 50    Total Output 2390 50   Net -683.5 +221.5         Foley:  None  A:  Normal renal function.  Negative fluid  balance.  Hypophosphatemia.  Hypokalemia. P:   KVO KPhos Trend BMP  GASTROINTESTINAL  Lab 09/28/11 0430 09/27/11 0415 09/26/11 0427 09/24/11 1110 09/24/11 0259  AST 60* 14 26 43* 89*  ALT 74* 40* 70* 189* 234*  ALKPHOS 93 85 115 148* 172*  BILITOT 0.1* 0.2* 0.3 0.5 0.6  PROT 5.5* 5.2* 5.6* 6.6 6.8  ALBUMIN 1.6* 1.4* 1.8* 2.3* 2.5*   A:  NPO as intubated.  Malnutrition.  Mildly elevated transaminases. P:   TF Recheck LFT  HEMATOLOGIC  Lab 09/30/11 0432 09/29/11 0500 09/28/11 0430 09/27/11 0415 09/26/11 0427 09/24/11 1110  HGB 8.7* 10.6* 9.1* 9.2* 11.4* --  HCT 26.6* 32.8* 28.1* 27.8* 35.2* --  PLT 233 302 255 241 287 --  INR -- -- -- -- -- --  APTT -- -- -- -- -- 28   A:  Mild  anemia. P:  Trend CBC  INFECTIOUS  Lab 09/30/11 0432 09/29/11 0500 09/28/11 0430 09/27/11 0415 09/26/11 0830 09/26/11 0427  WBC 13.0* 25.7* 18.1* 12.0* -- 20.8*  PROCALCITON -- -- -- 0.26 0.63 --   Cultures: 7/28  Blood >>> 1/2 gram pos rod (likely contaminant) 7/28  Pleural fluid >>> ntd  Antibiotics: Rocephin (CAP) 7/28 >>> 8/6 (stop date) Azithromycin  7/28 >>> 8/1 Vanc (Empyema) 7/28 >>> 8/4 (stop date)  A:  Community acquired pneumonia with empyema, remains culture negative. P:   Antibiotics / cultures as above  ENDOCRINE  Lab 09/30/11 0744 09/30/11 0417 09/29/11 2340 09/29/11 2011 09/29/11 1610  GLUCAP 119* 142* 119* 131* 131*   A:  Hyperglycemia.  Hypothyroidism. P:   SSI/CBG Synthroid  NEUROLOGIC  A:  Mild dementia due to Down syndrome.  Acute encephalopathy due to sedation. P:   Fentanyl / Versed gtt for synchrony Namenda, Buspar, Prozac as preadmission Daily WUA  BEST PRACTICE / DISPOSITION Level of Care:  ICU Primary Service:  PCCM  Consultants: TCTS Cornelius Moras) Code Status:  Full Diet:  TF DVT Px:  Heparin GI Px:  Protonix Skin Integrity:  Intact Social / Family:  Not available  The patient is critically ill with multiple organ systems failure and requires high complexity decision making for assessment and support, frequent evaluation and titration of therapies, application of advanced monitoring technologies and extensive interpretation of multiple databases. Critical Care Time devoted to patient care services described in this note is 40 minutes.  Lonia Farber, M.D. Pulmonary and Critical Care Medicine [facility] Pager: [phone]

## 2011-09-30 NOTE — Progress Notes (Signed)
PHARMACIST - PHYSICIAN COMMUNICATION DR:   CCM MD CONCERNING: Protonix IV to Oral Route Change Policy  RECOMMENDATION: This patient is receiving Protonix by the intravenous route.  Based on criteria approved by the Pharmacy and Therapeutics Committee, this drug is being converted to the equivalent oral dose form(s).  DESCRIPTION: These criteria include:  The patient is eating (either orally or via tube) and/or has been taking other orally administered medications for a least 24 hours  There is no active GI bleed or impaired GI absorption noted.   If you have questions about this conversion, please contact the Pharmacy Department  []   [phone] )  [facility] [x]   [phone] )  [facility]  []   [phone] )  [facility] []   [phone] )  [facility]    Georgina Pillion, PharmD, BCPS Clinical Pharmacist Pager: [phone] 09/30/2011 1:18 PM

## 2011-10-01 ENCOUNTER — Inpatient Hospital Stay (HOSPITAL_COMMUNITY): Payer: Medicare Other

## 2011-10-01 LAB — POCT I-STAT 3, ART BLOOD GAS (G3+)
Acid-Base Excess: 7 mmol/L — ABNORMAL HIGH (ref 0.0–2.0)
Acid-Base Excess: 7 mmol/L — ABNORMAL HIGH (ref 0.0–2.0)
Bicarbonate: 32.5 mEq/L — ABNORMAL HIGH (ref 20.0–24.0)
Bicarbonate: 32.3 mEq/L — ABNORMAL HIGH (ref 20.0–24.0)
Bicarbonate: 33 mEq/L — ABNORMAL HIGH (ref 20.0–24.0)
O2 Saturation: 88 %
O2 Saturation: 91 %
O2 Saturation: 94 %
Patient temperature: 101.9
Patient temperature: 100.7
TCO2: 34 mmol/L (ref 0–100)
TCO2: 35 mmol/L (ref 0–100)
pCO2 arterial: 53.6 mmHg — ABNORMAL HIGH (ref 35.0–45.0)
pCO2 arterial: 54 mmHg — ABNORMAL HIGH (ref 35.0–45.0)
pCO2 arterial: 56.2 mmHg — ABNORMAL HIGH (ref 35.0–45.0)
pH, Arterial: 7.392 (ref 7.350–7.450)
pH, Arterial: 7.382 (ref 7.350–7.450)
pO2, Arterial: 57 mmHg — ABNORMAL LOW (ref 80.0–100.0)
pO2, Arterial: 69 mmHg — ABNORMAL LOW (ref 80.0–100.0)
pO2, Arterial: 78 mmHg — ABNORMAL LOW (ref 80.0–100.0)

## 2011-10-01 LAB — CULTURE, BLOOD (ROUTINE X 2)

## 2011-10-01 LAB — COMPREHENSIVE METABOLIC PANEL
ALT: 241 U/L — ABNORMAL HIGH (ref 0–35)
AST: 76 U/L — ABNORMAL HIGH (ref 0–37)
Albumin: 1.3 g/dL — ABNORMAL LOW (ref 3.5–5.2)
Alkaline Phosphatase: 175 U/L — ABNORMAL HIGH (ref 39–117)
Chloride: 103 mEq/L (ref 96–112)
Potassium: 3.7 mEq/L (ref 3.5–5.1)
Sodium: 141 mEq/L (ref 135–145)
Total Bilirubin: 0.3 mg/dL (ref 0.3–1.2)

## 2011-10-01 LAB — GLUCOSE, CAPILLARY
Glucose-Capillary: 119 mg/dL — ABNORMAL HIGH (ref 70–99)
Glucose-Capillary: 142 mg/dL — ABNORMAL HIGH (ref 70–99)

## 2011-10-01 LAB — CBC
Platelets: 219 10*3/uL (ref 150–400)
RBC: 2.63 MIL/uL — ABNORMAL LOW (ref 3.87–5.11)
RDW: 15.2 % (ref 11.5–15.5)
WBC: 16.1 10*3/uL — ABNORMAL HIGH (ref 4.0–10.5)

## 2011-10-01 LAB — PROCALCITONIN: Procalcitonin: 0.29 ng/mL

## 2011-10-01 MED ORDER — FENTANYL CITRATE 0.05 MG/ML IJ SOLN
50.0000 ug | INTRAMUSCULAR | Status: DC | PRN
  Administered 2011-10-01: 50 ug via INTRAVENOUS
  Administered 2011-10-02: 100 ug via INTRAVENOUS
  Administered 2011-10-03: 50 ug via INTRAVENOUS
  Administered 2011-10-03 – 2011-10-04 (×8): 100 ug via INTRAVENOUS
  Filled 2011-10-01 (×12): qty 2

## 2011-10-01 MED ORDER — FUROSEMIDE 10 MG/ML IJ SOLN
INTRAMUSCULAR | Status: AC
  Administered 2011-10-01: 40 mg via INTRAVENOUS
  Filled 2011-10-01: qty 4

## 2011-10-01 MED ORDER — LINEZOLID 2 MG/ML IV SOLN
600.0000 mg | Freq: Two times a day (BID) | INTRAVENOUS | Status: DC
  Administered 2011-10-01 – 2011-10-04 (×7): 600 mg via INTRAVENOUS
  Filled 2011-10-01 (×8): qty 300

## 2011-10-01 MED ORDER — PIPERACILLIN-TAZOBACTAM 3.375 G IVPB
3.3750 g | Freq: Three times a day (TID) | INTRAVENOUS | Status: DC
  Administered 2011-10-01 – 2011-10-04 (×10): 3.375 g via INTRAVENOUS
  Filled 2011-10-01 (×11): qty 50

## 2011-10-01 MED ORDER — MIDAZOLAM HCL 2 MG/2ML IJ SOLN
2.0000 mg | INTRAMUSCULAR | Status: DC | PRN
  Administered 2011-10-02 – 2011-10-04 (×16): 2 mg via INTRAVENOUS
  Administered 2011-10-04: 4 mg via INTRAVENOUS
  Administered 2011-10-04 (×3): 2 mg via INTRAVENOUS
  Filled 2011-10-01 (×4): qty 2
  Filled 2011-10-01: qty 4
  Filled 2011-10-01 (×15): qty 2

## 2011-10-01 MED ORDER — POTASSIUM CHLORIDE 20 MEQ/15ML (10%) PO LIQD
40.0000 meq | Freq: Once | ORAL | Status: AC
  Administered 2011-10-01: 40 meq
  Filled 2011-10-01: qty 30

## 2011-10-01 MED ORDER — FUROSEMIDE 10 MG/ML IJ SOLN
40.0000 mg | Freq: Two times a day (BID) | INTRAMUSCULAR | Status: AC
  Administered 2011-10-01 (×2): 40 mg via INTRAVENOUS
  Filled 2011-10-01: qty 4

## 2011-10-01 MED ORDER — CIPROFLOXACIN IN D5W 400 MG/200ML IV SOLN
400.0000 mg | Freq: Three times a day (TID) | INTRAVENOUS | Status: DC
Start: 2011-10-01 — End: 2011-10-04
  Administered 2011-10-01 – 2011-10-04 (×10): 400 mg via INTRAVENOUS
  Filled 2011-10-01 (×11): qty 200

## 2011-10-01 NOTE — Progress Notes (Signed)
Name: [name] MRN: [id number] DOB: April 16, 1970    LOS: 7  Referring Provider:  [facility] Reason for Referral:  Postop respiratory failure  PULMONARY / CRITICAL CARE MEDICINE  Brief patient description:  41 year old white female Down's patient with pneumonia and pleural effusion, s/p decrotication for empyema, post op resp failure.  Events Since Admission: 7/28  Failed thoracentesis due to loculations, decrtication for empyema, reintubated in PACU 7/29  Extubated 7/30  Reintubated, hypoxia, distress 7/31  Removal chest tube 8/4    Increased oxygen requirements / WBC; ARDS vs edema; ARDS protocol, ABx changes, Lasix trial  Current Status:  Mucus plug this am.  Increased oxygen requirements.  Vital Signs: Temp:  [99.9 F (37.7 C)-101.2 F (38.4 C)] 101.2 F (38.4 C) (08/04 0804) Pulse Rate:  [86-123] 95  (08/04 0809) Resp:  [15-29] 18  (08/04 0809) BP: (87-107)/(47-61) 99/55 mmHg (08/04 0809) SpO2:  [93 %-98 %] 95 % (08/04 0809) FiO2 (%):  [54.4 %-79.8 %] 60 % (08/04 0809) Weight:  [112.2 kg (247 lb 5.7 oz)] 112.2 kg (247 lb 5.7 oz) (08/04 0500)  Physical Examination: General:  Ventilated, comfortable Neuro: Deeply sedated, unable to arouse HEENT: PERRL Neck:  No JVD Cardiovascular:  RRR Lungs:  Bilateral diminished air entry, few rhonchi / rales Abdomen:  Soft nontender no organomegaly Musculoskeletal:  Generalized edema Skin:  Intact  Principal Problem:  *Empyema of left pleural space Active Problems:  Shortness of breath  PNA (pneumonia)  Leukocytosis  Tachycardia  Transaminitis  Hyperglycemia  Pleural effusion  Acute respiratory failure with hypoxia  ASSESSMENT AND PLAN  PULMONARY  Lab 09/30/11 2311 09/26/11 1024 09/25/11 0930 09/25/11 0345 09/24/11 1637  PHART 7.425 7.395 7.346* 7.311* 7.356  PCO2ART 44.1 38.0 50.0* 49.4* 41.2  PO2ART 72.8* 196.0* 57.0* 94.4 62.0*  HCO3 28.4* 23.4 27.5* 24.2* 23.6  O2SAT 94.5 100.0 88.0 97.2 93.0   Ventilator  Settings: on Nasal cannulae Vent Mode:  [-] PRVC FiO2 (%):  [54.4 %-79.8 %] 60 % Set Rate:  [16 bmp-20 bmp] 16 bmp Vt Set:  [500 mL] 500 mL PEEP:  [5 cmH20] 5 cmH20 Plateau Pressure:  [25 cmH20-34 cmH20] 32 cmH20  CXR:  8/3 >>> Interstitial / alveolar densities bilaterally, bilateral effusions L>R ETT:   7/28 >>> 7/29 7/30 >>>  A:  Empyema s/p decortication.  Pneumonia.  Acute respiratory failure.  ? ARDS / pulmonary edema given interstitial infiltrates / increased oxygen requirements. P:   Full mechanical support Start ARDS protocol SBT daily Diuresis per Renal Section  CARDIOVASCULAR  Lab 09/27/11 0415  TROPONINI --  LATICACIDVEN --  PROBNP 322.8*   ECG:  Reviewed Lines: Peripheral IV  A: Hemodynamically stable.  No arrhythmia / ischemia. P:  Telemetry  RENAL  Lab 10/01/11 0400 09/30/11 0432 09/29/11 0500 09/28/11 0430 09/27/11 0415  NA 141 141 143 136 135  K 3.7 3.5 -- -- --  CL 103 104 104 102 103  CO2 31 31 29 23 22   BUN 24* 25* 28* 21 14  CREATININE 0.86 0.92 0.94 0.79 0.77  CALCIUM 7.6* 7.5* 8.2* 7.9* 7.5*  MG -- 1.9 2.1 -- --  PHOS -- 1.5* 2.5 -- --   Intake/Output      08/03 0701 - 08/04 0700 08/04 0701 - 08/05 0700   I.V. (mL/kg) 640.5 (5.7)    NG/GT 785    IV Piggyback 760    Total Intake(mL/kg) 2185.5 (19.5)    Urine (mL/kg/hr) 900 (0.3)    Stool  Total Output 900    Net +1285.5          Foley:  None  A:  Normal renal function.  Positive fluid balance.  Hypophosphatemia, replaced yesterday. P:   Goal I/O 1.0L negative KVO Recheck Phos K 40 Lasix 40 IV q12h x 2 doses Trend BMP  GASTROINTESTINAL  Lab 10/01/11 0400 09/28/11 0430 09/27/11 0415 09/26/11 0427 09/24/11 1110  AST 76* 60* 14 26 43*  ALT 241* 74* 40* 70* 189*  ALKPHOS 175* 93 85 115 148*  BILITOT 0.3 0.1* 0.2* 0.3 0.5  PROT 5.1* 5.5* 5.2* 5.6* 6.6  ALBUMIN 1.3* 1.6* 1.4* 1.8* 2.3*   A:  NPO as intubated.  Malnutrition.  Rising liver enzymes, ? Acalculous  cholecystitis P:   TF Trend LFT RUQ [place]  HEMATOLOGIC  Lab 10/01/11 0400 09/30/11 0432 09/29/11 0500 09/28/11 0430 09/27/11 0415 09/24/11 1110  HGB 8.7* 8.7* 10.6* 9.1* 9.2* --  HCT 27.0* 26.6* 32.8* 28.1* 27.8* --  PLT 219 233 302 255 241 --  INR -- -- -- -- -- --  APTT -- -- -- -- -- 28   A:  Mild anemia, stable. P:  Trend CBC  INFECTIOUS  Lab 10/01/11 0400 09/30/11 0432 09/29/11 0500 09/28/11 0430 09/27/11 0415 09/26/11 0830  WBC 16.1* 13.0* 25.7* 18.1* 12.0* --  PROCALCITON -- -- -- -- 0.26 0.63   Cultures: 7/28  Blood >>> 1/2 gram pos rod (likely contaminant) 7/28  Pleural fluid >>> ntd  Antibiotics: Rocephin (CAP) 7/28 >>> 8/4 Azithromycin  7/28 >>> 8/1 Vanc (Empyema) 7/28 >>> 8/4 Zosyn 8/4 >>> Cipro 8/4 >>> Zyvox 8/4 >>>  A:  Community acquired pneumonia with empyema, remains culture negative. ? VAP. Also, possibly cholecystitis. P:   D/c Rocephin Start Zosyn per pharmacy (anerobic / G-) Start Cipro (Double coverage for Pseudomonas) D/c Vancomycin Start Zyvox (presumed Vancomycin failure, MRSA) Repeat PCT, respiratory cultures  ENDOCRINE  Lab 10/01/11 0757 10/01/11 0404 10/01/11 0004 09/30/11 2026 09/30/11 1539  GLUCAP 145* 136* 119* 157* 167*   A:  Hyperglycemia.  Hypothyroidism. P:   SSI/CBG Synthroid  NEUROLOGIC  A:  Mild dementia due to Down syndrome.  Acute encephalopathy due to sedation.  Appears oversedated. P:   D/c Fentanyl / Versed gtt  Start intermittent sedation Namenda, Buspar, as preadmission Hold Prozac as risk of serotonin syndrome with Zyvox Daily WUA  BEST PRACTICE / DISPOSITION Level of Care:  ICU Primary Service:  PCCM  Consultants: TCTS Cornelius Moras) Code Status:  Full Diet:  TF DVT Px:  Heparin GI Px:  Protonix Skin Integrity:  Intact Social / Family:  Family updated at bedside.  The patient is critically ill with multiple organ systems failure and requires high complexity decision making for assessment and support,  frequent evaluation and titration of therapies, application of advanced monitoring technologies and extensive interpretation of multiple databases. Critical Care Time devoted to patient care services described in this note is 45 minutes.  Lonia Farber, M.D. Pulmonary and Critical Care Medicine [facility] Pager: [phone]

## 2011-10-01 NOTE — Progress Notes (Signed)
PHARMACY - CRITICAL CARE PROGRESS NOTE  Pharmacy Consult for Zosyn Indication: Empiric VAP + cholecystitis + empyema coverage   No Known Allergies  Patient Measurements: Height: 5\' 2"  (157.5 cm) Weight: 247 lb 5.7 oz (112.2 kg) IBW/kg (Calculated) : 50.1   Vital Signs: Temp: 100 F (37.8 C) (08/04 1202) Temp src: Oral (08/04 1202) BP: 88/46 mmHg (08/04 1000) Pulse Rate: 90  (08/04 1000) Intake/Output from previous day: 08/03 0701 - 08/04 0700 In: 2185.5 [I.V.:640.5; NG/GT:785; IV Piggyback:760] Out: 900 [Urine:900] Intake/Output from this shift:   Vent settings for last 24 hours: Vent Mode:  [-] PRVC FiO2 (%):  [50 %-60.3 %] 50 % Set Rate:  [16 bmp-20 bmp] 16 bmp Vt Set:  [500 mL] 500 mL PEEP:  [5 cmH20-8 cmH20] 8 cmH20 Plateau Pressure:  [25 cmH20-34 cmH20] 30 cmH20  Labs:  Basename 10/01/11 0400 09/30/11 0432 09/29/11 0500  WBC 16.1* 13.0* 25.7*  HGB 8.7* 8.7* 10.6*  HCT 27.0* 26.6* 32.8*  PLT 219 233 302  APTT -- -- --  INR -- -- --  CREATININE 0.86 0.92 0.94  LABCREA -- -- --  CREATININE 0.86 0.92 0.94  LABCREA -- -- --  CREAT24HRUR -- -- --  MG -- 1.9 2.1  PHOS -- 1.5* 2.5  ALBUMIN 1.3* -- --  PROT 5.1* -- --  AST 76* -- --  ALT 241* -- --  ALKPHOS 175* -- --  BILITOT 0.3 -- --  BILIDIR -- -- --  IBILI -- -- --   Estimated Creatinine Clearance: 101.8 ml/min (by C-G formula based on Cr of 0.86).   Basename 10/01/11 1147 10/01/11 0757 10/01/11 0404  GLUCAP 145* 145* 136*    Microbiology: Recent Results (from the past 720 hour(s))  CULTURE, BLOOD (ROUTINE X 2)     Status: Normal   Collection Time   09/24/11  5:25 AM      Component Value Range Status Comment   Specimen Description BLOOD RIGHT HAND   Final    Special Requests BOTTLES DRAWN AEROBIC ONLY 4 CC   Final    Culture  Setup Time 09/24/2011 11:08   Final    Culture NO GROWTH 5 DAYS   Final    Report Status 09/30/2011 FINAL   Final   CULTURE, BLOOD (ROUTINE X 2)     Status: Normal   Collection Time   09/24/11  5:25 AM      Component Value Range Status Comment   Specimen Description BLOOD LEFT HAND   Final    Special Requests BOTTLES DRAWN AEROBIC AND ANAEROBIC 3 CC EACH   Final    Culture  Setup Time 09/24/2011 11:08   Final    Culture     Final    Value: DIPHTHEROIDS(CORYNEBACTERIUM SPECIES)     Note: Standardized susceptibility testing for this organism is not available.     Note: Gram Stain Report Called to,Read Back By and Verified With: JENNIFER AT 0230 09/29/11 BY SNOLO   Report Status 10/01/2011 FINAL   Final   MRSA PCR SCREENING     Status: Abnormal   Collection Time   09/24/11  8:28 AM      Component Value Range Status Comment   MRSA by PCR POSITIVE (*) NEGATIVE Final   BODY FLUID CULTURE     Status: Normal   Collection Time   09/24/11  3:24 PM      Component Value Range Status Comment   Specimen Description PLEURAL LEFT FLUID   Final  Special Requests TUBE 3   Final    Gram Stain     Final    Value: FEW WBC PRESENT, PREDOMINANTLY PMN     NO ORGANISMS SEEN   Culture NO GROWTH 3 DAYS   Final    Report Status 09/28/2011 FINAL   Final   CULTURE, RESPIRATORY     Status: Normal   Collection Time   09/24/11  6:35 PM      Component Value Range Status Comment   Specimen Description TRACHEAL ASPIRATE   Final    Special Requests NONE   Final    Gram Stain     Final    Value: FEW WBC PRESENT,BOTH PMN AND MONONUCLEAR     NO SQUAMOUS EPITHELIAL CELLS SEEN     NO ORGANISMS SEEN   Culture Non-Pathogenic Oropharyngeal-type Flora Isolated.   Final    Report Status 09/26/2011 FINAL   Final     Medications:  Anti-infectives     Start     Dose/Rate Route Frequency Ordered Stop   10/01/11 1400  piperacillin-tazobactam (ZOSYN) IVPB 3.375 g       3.375 g 12.5 mL/hr over 240 Minutes Intravenous 3 times per day 10/01/11 1210     10/01/11 1200   ciprofloxacin (CIPRO) IVPB 400 mg        400 mg 200 mL/hr over 60 Minutes Intravenous Every 8 hours 10/01/11 1121      10/01/11 1200   linezolid (ZYVOX) IVPB 600 mg        600 mg 300 mL/hr over 60 Minutes Intravenous Every 12 hours 10/01/11 1121     09/25/11 1000   azithromycin (ZITHROMAX) tablet 500 mg  Status:  Discontinued        500 mg Oral Daily 09/24/11 0806 09/25/11 0849   09/25/11 0900   azithromycin (ZITHROMAX) 500 mg in dextrose 5 % 250 mL IVPB        500 mg 250 mL/hr over 60 Minutes Intravenous Every 24 hours 09/25/11 0849 09/27/11 0931   09/25/11 0100   vancomycin (VANCOCIN) IVPB 1000 mg/200 mL premix  Status:  Discontinued        1,000 mg 200 mL/hr over 60 Minutes Intravenous Every 12 hours 09/24/11 1557 10/01/11 1121   09/24/11 1545   vancomycin (VANCOCIN) IVPB 1000 mg/200 mL premix        1,000 mg 200 mL/hr over 60 Minutes Intravenous Every 12 hours 09/24/11 1540 09/24/11 1745   09/24/11 1200   vancomycin (VANCOCIN) 1,500 mg in sodium chloride 0.9 % 500 mL IVPB        1,500 mg 250 mL/hr over 120 Minutes Intravenous 60 min pre-op 09/24/11 1100 09/24/11 1256   09/24/11 0530   cefTRIAXone (ROCEPHIN) 1 g in dextrose 5 % 50 mL IVPB  Status:  Discontinued        1 g 100 mL/hr over 30 Minutes Intravenous Every 24 hours 09/24/11 0526 10/01/11 1121   09/24/11 0530   azithromycin (ZITHROMAX) tablet 500 mg  Status:  Discontinued        500 mg Oral Every 24 hours 09/24/11 0526 09/24/11 0806   09/24/11 0430   cefTRIAXone (ROCEPHIN) 1 g in dextrose 5 % 50 mL IVPB  Status:  Discontinued        1 g 100 mL/hr over 30 Minutes Intravenous  Once 09/24/11 0429 09/24/11 0526   09/24/11 0430   azithromycin (ZITHROMAX) 500 mg in dextrose 5 % 250 mL IVPB  Status:  Discontinued  500 mg 250 mL/hr over 60 Minutes Intravenous Every 24 hours 09/24/11 0429 09/24/11 0526          Assessment: 41 y.o. F with [name] admitted since 7/28 with SOB and found to pleural effusions, PNA, and empyema (now s/p decortication). The patient has continued to spike fevers and have increased oxygen  requirements. Since the patient has been intubated since 7/30 -- she is at risk for VAP. New cultures have been ordered and antibiotics have been adjusted to Linezolid + Zosyn + Cipro. SCr 0.86, CrCl~80-90 ml/min.    Pharmacy System-Based Medication Review Anticoagulation: None PTA, Hep SQ for VTE px  Infectious Disease: Vanc/Rocephin d/ced and adjusted d/t persistent fevers. Pt is now at risk for VAP d/t prolonged intubation. Abx changed to Zosyn + Linezolid + Cipro for empiric VAP/empyema/cholecystitis coverage. CXR(8/4) concerning for PNA. S/p failed VATS on 7/28 for acute L empyema/pleural effusion. Chest tube removed 7/31.  Tmax/24h: 101.2, WBC 16.1 << 13. SCr 0.86, CrCl~80-90 ml/min.   Zosyn 8/4 >> Linezolid 8/4 >> Cipro 8/4 >> Azithro 7/28 >>7/31 Rocephin 7/28 >> 8/4 Vanco 7/28 >> 8/4  8/4 Trach aspirate >> 7/28 BCx >> 1/2 corynebacterium diphtheroids -- likely contaminate 7/28 BFCx >> NG 7/28 RCx >> oral flora 7/28 MRSA PCR >> positive  Cardiovascular: BP soft (90-100/50-55), HR: 90-100 (NSR). On lasix Endocrinology: Hx hypothyroidism. TSH wnl on admit. CBGs/24h: 119-167. On synthroid 50 mcg IV (112 mcg po PTA), SSI (4 units/24h) Gastrointestinal / Nutrition: Oxepa at goal rate of 25 cc/hr. AST/ALT/alk phos up, ?chlecystitis --d/t Rocephin? On PPI PT Neurology: Hx Down syndrome. GCS 11. Fluoxetine d/ced while on Zyvox. Continues on buspirone, memantine. Sedated on Fent/Versed Nephrology: SCr 0.86, CrCl~80-90 ml/min. UOP 0.5 ml/kg/hr (dec). Lytes ok.  Pulmonary: Hx bronchitis and multiple episodes of PNA. Re-intubated since 7/30. Chest tubes removed 8/1. CXR(8/4): Interstitial + airspace opacities, concerning for multilobar PNA, small R and mod L pleural effusions Hematology / Oncology: Hgb/Hct drop 8.7/27, plts 219 << 233.  PTA Medication Issues: n/a Best Practices: Hep SQ, MC, PPI IV  Goal of Therapy:  Eradication of Infection  Plan:  1. Zosyn 3.375g IV every 8 hours 2.  Will watch for serotonin syndrome as there was no wash-out period between fluoxetine and linezolid 3. Will continue to follow renal function, culture results, LOT, and antibiotic de-escalation plans   Georgina Pillion, PharmD, BCPS Clinical Pharmacist Pager: [phone] 10/01/2011 12:10 PM

## 2011-10-01 NOTE — Progress Notes (Signed)
eLink Physician-Brief Progress Note Patient Name: [name] DOB: 12/02/1970 MRN: [id number]  Date of Service  10/01/2011   HPI/Events of Note   abg  eICU Interventions  Reduce fio2 , rate      FEINSTEIN,DANIEL J. 10/01/2011, 1:22 AM

## 2011-10-01 NOTE — Progress Notes (Signed)
Patient place on 6cc=38mls, RR 24 per ARDS protocol.

## 2011-10-01 NOTE — Progress Notes (Signed)
14:40 Obtained sputum sample and sent to main lab

## 2011-10-02 ENCOUNTER — Inpatient Hospital Stay (HOSPITAL_COMMUNITY): Payer: Medicare Other

## 2011-10-02 LAB — GLUCOSE, CAPILLARY
Glucose-Capillary: 110 mg/dL — ABNORMAL HIGH (ref 70–99)
Glucose-Capillary: 158 mg/dL — ABNORMAL HIGH (ref 70–99)
Glucose-Capillary: 106 mg/dL — ABNORMAL HIGH (ref 70–99)

## 2011-10-02 LAB — CBC
Hemoglobin: 8.9 g/dL — ABNORMAL LOW (ref 12.0–15.0)
MCH: 33.7 pg (ref 26.0–34.0)
Platelets: 246 10*3/uL (ref 150–400)
RBC: 2.64 MIL/uL — ABNORMAL LOW (ref 3.87–5.11)
WBC: 16.4 10*3/uL — ABNORMAL HIGH (ref 4.0–10.5)

## 2011-10-02 LAB — COMPREHENSIVE METABOLIC PANEL
ALT: 199 U/L — ABNORMAL HIGH (ref 0–35)
AST: 80 U/L — ABNORMAL HIGH (ref 0–37)
Alkaline Phosphatase: 157 U/L — ABNORMAL HIGH (ref 39–117)
CO2: 36 mEq/L — ABNORMAL HIGH (ref 19–32)
Calcium: 7.8 mg/dL — ABNORMAL LOW (ref 8.4–10.5)
GFR calc Af Amer: 82 mL/min — ABNORMAL LOW (ref >90)
GFR calc non Af Amer: 71 mL/min — ABNORMAL LOW (ref >90)
Glucose, Bld: 113 mg/dL — ABNORMAL HIGH (ref 70–99)
Potassium: 3.9 mEq/L (ref 3.5–5.1)
Sodium: 141 mEq/L (ref 135–145)

## 2011-10-02 MED ORDER — SODIUM CHLORIDE 0.9 % IJ SOLN: 10.0000 mL | INTRAMUSCULAR | Status: DC | PRN

## 2011-10-02 MED ORDER — SODIUM CHLORIDE 0.9 % IJ SOLN
10.0000 mL | Freq: Two times a day (BID) | INTRAMUSCULAR | Status: DC
  Administered 2011-10-02: 10 mL
  Administered 2011-10-02: 20 mL
  Administered 2011-10-03: 30 mL
  Administered 2011-10-04 (×2): 20 mL
  Administered 2011-10-05 (×3): 10 mL
  Administered 2011-10-06: 20 mL
  Administered 2011-10-06 – 2011-10-07 (×2): 10 mL
  Administered 2011-10-08: 20 mL
  Administered 2011-10-08 – 2011-10-12 (×3): 10 mL

## 2011-10-02 MED ORDER — POTASSIUM CHLORIDE 20 MEQ/15ML (10%) PO LIQD
ORAL | Status: AC
  Administered 2011-10-02: 40 meq
  Filled 2011-10-02: qty 30

## 2011-10-02 MED ORDER — POTASSIUM CHLORIDE 20 MEQ/15ML (10%) PO LIQD
40.0000 meq | Freq: Three times a day (TID) | ORAL | Status: AC
  Administered 2011-10-02 (×2): 40 meq
  Filled 2011-10-02 (×2): qty 30

## 2011-10-02 MED ORDER — FUROSEMIDE 10 MG/ML IJ SOLN
40.0000 mg | Freq: Three times a day (TID) | INTRAMUSCULAR | Status: AC
  Administered 2011-10-02 (×2): 40 mg via INTRAVENOUS
  Filled 2011-10-02 (×2): qty 4

## 2011-10-02 NOTE — Progress Notes (Signed)
Name: [name] MRN: [id number] DOB: 1970-11-29    LOS: 8  Referring Provider:  [facility] Reason for Referral:  Postop respiratory failure  PULMONARY / CRITICAL CARE MEDICINE  Brief patient description:  41 year old white female Down's patient with pneumonia and pleural effusion, s/p decortication for empyema, post op resp failure.  Events Since Admission: 7/28  Failed thoracentesis due to loculations, decrtication for empyema, reintubated in PACU 7/29  Extubated 7/30  Reintubated, hypoxia, distress 7/31  Removal chest tube 8/4    Increased oxygen requirements / WBC; ARDS vs edema; ARDS protocol, ABx changes, Lasix trial  Current Status:   Still on increased FiO2/PEEP.  Still has fever.  Nurse reports fluid leaking from prior chest tube site>>improved with pressure dressing.  Vital Signs: Temp:  [99.7 F (37.6 C)-101.9 F (38.8 C)] 99.7 F (37.6 C) (08/05 0736) Pulse Rate:  [83-114] 114  (08/05 0817) Resp:  [16-34] 34  (08/05 0817) BP: (88-112)/(39-62) 112/48 mmHg (08/05 0817) SpO2:  [91 %-98 %] 91 % (08/05 0817) FiO2 (%):  [49.7 %-70 %] 70 % (08/05 0818) Weight:  [242 lb 15.2 oz (110.2 kg)] 242 lb 15.2 oz (110.2 kg) (08/05 0500)  Physical Examination: General:  Ventilated, comfortable Neuro: Sedated HEENT: ETT, OG in place Neck:  Rt IJ site clean Cardiovascular:  s1s2 regular Lungs:  Decreased breath sounds, scattered rhonchi Abdomen:  Soft nontender no organomegaly Musculoskeletal:  Generalized edema Skin:  Intact  Dg Chest Port 1 View  10/02/2011  *RADIOLOGY REPORT*  Clinical Data: Progression of infiltrates.  PORTABLE CHEST - 1 VIEW  Comparison: 10/01/2011.  Findings: Endotracheal tube 55 mm from the carina.  Enteric tube remains present.  Right IJ central line with the tip likely at the confluence of the brachiocephalic veins or in the distal right internal jugular.  Shifting bilateral airspace disease. Monitoring leads are projected over the chest.  Cardiopericardial  silhouette grossly unchanged, partially obscured by airspace disease.    Small left pleural effusion.  IMPRESSION:  1.  Stable support apparatus. 2.  Shifting airspace disease which may represent edema, pneumonia or a combination.  Original Report Authenticated By: Andreas Newport, M.D.   Dg Chest Port 1 View  10/01/2011  *RADIOLOGY REPORT*  Clinical Data: Pneumonia.  PORTABLE CHEST - 1 VIEW  Comparison: Chest x-ray 09/30/2011.  Findings: There is a right-sided internal jugular central venous catheter with tip terminating in the proximal superior vena cava. A nasogastric tube is seen extending into the stomach, however, the tip of the nasogastric tube extends below the lower margin of the image. An endotracheal tube is in place with tip 6.6 cm above the carina. Lung volumes are normal.  There continues to be extensive patchy multifocal interstitial and airspace disease scattered throughout all aspects of the lungs bilaterally, with asymmetric involvement of the left lung to a greater extent than the right. Moderate left-sided pleural effusion layering posteriorly. Probable small right-sided posterior layering pleural effusion as well.  Heart size is enlarged, however, cardiomediastinal contours are largely obscured by overlying airspace disease.  IMPRESSION: 1.  Support apparatus, as above. 2.  Multifocal asymmetric interstitial and airspace opacities, concerning for multilobar pneumonia, as above. 3.  Probable small right and moderate left pleural effusions layering posteriorly.  Original Report Authenticated By: Florencia Reasons, M.D.    ASSESSMENT AND PLAN  PULMONARY  Lab 10/01/11 2152 10/01/11 2023 10/01/11 1444 09/30/11 2311 09/26/11 1024  PHART 7.382 7.392 7.390 7.425 7.395  PCO2ART 56.2* 54.0* 53.6* 44.1 38.0  PO2ART 78.0*  69.0* 57.0* 72.8* 196.0*  HCO3 33.0* 32.3* 32.5* 28.4* 23.4  O2SAT 94.0 91.0 88.0 94.5 100.0   Ventilator Settings: on Nasal cannulae Vent Mode:  [-] PRVC FiO2 (%):   [49.7 %-70 %] 70 % Set Rate:  [22 bmp-26 bmp] 24 bmp Vt Set:  [300 mL-350 mL] 300 mL PEEP:  [5 cmH20-10 cmH20] 10 cmH20 Plateau Pressure:  [19 cmH20-30 cmH20] 19 cmH20  ETT:   7/28 >>> 7/29 7/30 >>>  A:  Empyema s/p decortication.  Pneumonia.  Acute respiratory failure.  ? ARDS / pulmonary edema given interstitial infiltrates / increased oxygen requirements. P:   Titrate PEEP/FiO2 to keep SpO2 > 88%, PaO2 > 60 mmHg F/u CXR Continue diuresis as tolerate to keep in negative fluid balance Discussed possible need for trach>>Mother would be agreeable to this  CARDIOVASCULAR  Lines: Rt IJ CVL 7/28>>  A: Generalized edema P:  Give lasix 40 mg IV q8h x 2 on 8/05  RENAL  Lab 10/02/11 0415 10/01/11 1130 10/01/11 0400 09/30/11 0432 09/29/11 0500 09/28/11 0430  NA 141 -- 141 141 143 136  K 3.9 -- 3.7 -- -- --  CL 99 -- 103 104 104 102  CO2 36* -- 31 31 29 23   BUN 22 -- 24* 25* 28* 21  CREATININE 0.98 -- 0.86 0.92 0.94 0.79  CALCIUM 7.8* -- 7.6* 7.5* 8.2* 7.9*  MG -- -- -- 1.9 2.1 --  PHOS -- 3.1 -- 1.5* 2.5 --   Intake/Output      08/04 0701 - 08/05 0700 08/05 0701 - 08/06 0700   I.V. (mL/kg) 347.5 (3.2)    NG/GT 515    IV Piggyback 1012.5    Total Intake(mL/kg) 1875 (17)    Urine (mL/kg/hr) 3785 (1.4)    Stool 50    Total Output 3835    Net -1960            A:  Hypokalemia, hypophosphatemia P:   F/u and replace electrolytes while getting lasix  GASTROINTESTINAL  Lab 10/02/11 0415 10/01/11 0400 09/28/11 0430 09/27/11 0415 09/26/11 0427  AST 80* 76* 60* 14 26  ALT 199* 241* 74* 40* 70*  ALKPHOS 157* 175* 93 85 115  BILITOT 0.3 0.3 0.1* 0.2* 0.3  PROT 6.0 5.1* 5.5* 5.2* 5.6*  ALBUMIN 1.4* 1.3* 1.6* 1.4* 1.8*   A:  Nutrition.  Increased LFT. P:   F/u Ultrasound abdomen>> Resume tube feeds after Abd u/s  HEMATOLOGIC  Lab 10/02/11 0415 10/01/11 0400 09/30/11 0432 09/29/11 0500 09/28/11 0430  HGB 8.9* 8.7* 8.7* 10.6* 9.1*  HCT 27.5* 27.0* 26.6* 32.8* 28.1*    PLT 246 219 233 302 255  INR -- -- -- -- --  APTT -- -- -- -- --   A:  Mild anemia, stable. P:  F/u CBC Transfuse for Hb < 7  INFECTIOUS  Lab 10/02/11 0415 10/01/11 1130 10/01/11 0400 09/30/11 0432 09/29/11 0500 09/28/11 0430 09/27/11 0415 09/26/11 0830  WBC 16.4* -- 16.1* 13.0* 25.7* 18.1* -- --  PROCALCITON -- 0.29 -- -- -- -- 0.26 0.63   Cultures: 7/28  Blood >>Diphtheroids 7/28  Pleural fluid >>negative 7/28 Sputum>>negative 8/04 Sputum>>  Antibiotics: Rocephin (CAP) 7/28 >>> 8/4 Azithromycin  7/28 >>> 8/1 Vanc (Empyema) 7/28 >>> 8/4 Zosyn 8/4 >>> Cipro 8/4 >>> Zyvox 8/4 >>>  A:  Community acquired pneumonia with empyema, remains culture negative.  Has persistent fever>>?VAP vs Line vs acalculous cholecystitis. P:   Repeat urine cx, blood cx F/u Sputum cx from 8/04  Change Rt IJ CVL to PICC line F/u Abd u/s Continue zyvox, cipro, zosyn for now  ENDOCRINE  Lab 10/02/11 0733 10/02/11 0355 10/02/11 0006 10/01/11 1955 10/01/11 1616  GLUCAP 103* 110* 158* 114* 142*   A:  Hyperglycemia.  Hx of hypothyroidism. P:   SSI/CBG Synthroid  NEUROLOGIC  A:  Mild dementia due to Down syndrome.  Acute encephalopathy due to sedation.  P:   Intermittent sedation Namenda, Buspar, as preadmission Hold Prozac as risk of serotonin syndrome with Zyvox Daily WUA  BEST PRACTICE / DISPOSITION Level of Care:  ICU Primary Service:  PCCM  Consultants: TCTS Cornelius Moras) Code Status:  Full Diet:  TF DVT Px:  Heparin GI Px:  Protonix Skin Integrity:  Intact Social / Family:  Family updated at bedside.  CC time 35 min.  Coralyn Helling, MD [facility] Pulmonary/Critical Care 10/02/2011, 8:52 AM Pager:  [phone] After 3pm call: [phone]

## 2011-10-02 NOTE — Progress Notes (Signed)
Nutrition Brief Note  RD drawn to chart secondary to current prescription of MAOI (Zyvox), a medication with potential interactions with high tyramine-containing foods.   Patient is currently intubated and on nutrition support; not appropriate for diet education at this time. RD will readdress if pt continues on this medication and is advanced to a PO diet.   RD will continue to follow for enteral nutrition management.  Kirkland Hun, RD, LDN Pager #: [phone] After-Hours Pager #: [phone]

## 2011-10-02 NOTE — Progress Notes (Addendum)
After turning patient, pt became very restless. Pt given PRN fentanyl and later PRN versed to help calm patient. Pt had increased oxygen needs during this period agitation. Sood, MD called at [phone] and notified of these findings and also made aware of the leaking of air from previous chest tube site (not a new finding). Instructed to increase FIO2 until MD arrives. Mother at the bedside at 0830 and updated.

## 2011-10-03 ENCOUNTER — Inpatient Hospital Stay (HOSPITAL_COMMUNITY): Payer: Medicare Other

## 2011-10-03 LAB — URINE CULTURE

## 2011-10-03 LAB — GLUCOSE, CAPILLARY
Glucose-Capillary: 118 mg/dL — ABNORMAL HIGH (ref 70–99)
Glucose-Capillary: 113 mg/dL — ABNORMAL HIGH (ref 70–99)
Glucose-Capillary: 165 mg/dL — ABNORMAL HIGH (ref 70–99)

## 2011-10-03 LAB — COMPREHENSIVE METABOLIC PANEL
ALT: 155 U/L — ABNORMAL HIGH (ref 0–35)
AST: 73 U/L — ABNORMAL HIGH (ref 0–37)
Albumin: 1.3 g/dL — ABNORMAL LOW (ref 3.5–5.2)
Alkaline Phosphatase: 129 U/L — ABNORMAL HIGH (ref 39–117)
Glucose, Bld: 146 mg/dL — ABNORMAL HIGH (ref 70–99)
Potassium: 3.8 mEq/L (ref 3.5–5.1)
Sodium: 139 mEq/L (ref 135–145)
Total Protein: 5.5 g/dL — ABNORMAL LOW (ref 6.0–8.3)

## 2011-10-03 LAB — CULTURE, RESPIRATORY W GRAM STAIN: Gram Stain: NONE SEEN

## 2011-10-03 LAB — CBC
Hemoglobin: 8.7 g/dL — ABNORMAL LOW (ref 12.0–15.0)
MCHC: 31.9 g/dL (ref 30.0–36.0)
Platelets: 233 10*3/uL (ref 150–400)

## 2011-10-03 LAB — PROTIME-INR: INR: 1.26 (ref 0.00–1.49)

## 2011-10-03 MED ORDER — OXEPA PO LIQD
1000.0000 mL | ORAL | Status: DC
  Administered 2011-10-03 – 2011-10-11 (×9): 1000 mL
  Filled 2011-10-03 (×14): qty 1000

## 2011-10-03 MED ORDER — LEVOTHYROXINE SODIUM 112 MCG PO TABS
112.0000 ug | ORAL_TABLET | Freq: Every day | ORAL | Status: DC
  Filled 2011-10-03: qty 1

## 2011-10-03 MED ORDER — POTASSIUM CHLORIDE 20 MEQ/15ML (10%) PO LIQD
40.0000 meq | Freq: Once | ORAL | Status: AC
  Administered 2011-10-03: 40 meq
  Filled 2011-10-03: qty 30

## 2011-10-03 MED ORDER — FUROSEMIDE 10 MG/ML IJ SOLN
40.0000 mg | Freq: Once | INTRAMUSCULAR | Status: AC
  Administered 2011-10-03: 40 mg via INTRAVENOUS
  Filled 2011-10-03: qty 4

## 2011-10-03 MED ORDER — LEVOTHYROXINE SODIUM 112 MCG PO TABS
112.0000 ug | ORAL_TABLET | Freq: Every day | ORAL | Status: DC
  Administered 2011-10-03 – 2011-10-12 (×10): 112 ug via ORAL
  Filled 2011-10-03 (×11): qty 1

## 2011-10-03 NOTE — Progress Notes (Signed)
Pt remains on full support tolerating well, no wean this am due to peep and fio2 demand, will try to wean down as tolerated throughout day, will continue to moniotr

## 2011-10-03 NOTE — Progress Notes (Signed)
Name: [name] MRN: [id number] DOB: [birth year]    LOS: 9  Referring Provider:  [facility] Reason for Referral:  Postop respiratory failure  PULMONARY / CRITICAL CARE MEDICINE  Brief patient description:  40 year old white female Down's patient with pneumonia and pleural effusion, s/p decortication for empyema, post op resp failure.  Events Since Admission: 7/28  Failed thoracentesis due to loculations, decrtication for empyema, reintubated in PACU 7/29  Extubated 7/30  Reintubated, hypoxia, distress 7/31  Removal chest tube 8/4    Increased oxygen requirements / WBC; ARDS vs edema; ARDS protocol, ABx changes, Lasix trial  Current Status:   Still on increased FiO2/PEEP, but improved.  No recurrent of fever in past 24 hrs.  No further fluid leakage from chest tube site.  Vital Signs: Temp:  [99.1 F (37.3 C)-100.1 F (37.8 C)] 100.1 F (37.8 C) (08/06 0820) Pulse Rate:  [81-113] 92  (08/06 0830) Resp:  [23-43] 24  (08/06 0830) BP: (90-112)/(41-58) 98/58 mmHg (08/06 0830) SpO2:  [89 %-99 %] 89 % (08/06 0830) FiO2 (%):  [49.6 %-100 %] 100 % (08/06 0830) Weight:  [239 lb 6.7 oz (108.6 kg)] 239 lb 6.7 oz (108.6 kg) (08/06 0600)  Physical Examination: General:  Ventilated, comfortable Neuro: Sedated HEENT: ETT, OG in place, enlarged tongue Neck:  Rt IJ site clean Cardiovascular:  s1s2 regular Lungs:  Decreased breath sounds, scattered rhonchi Abdomen:  Soft nontender no organomegaly Musculoskeletal:  Generalized edema Skin:  Intact  US Abdomen Port  10/02/2011  *RADIOLOGY REPORT*  Clinical Data:  Elevated liver function tests.  COMPLETE ABDOMINAL ULTRASOUND  Comparison:  None.  Findings:  Gallbladder:  No gallstones, gallbladder wall thickening, or pericholecystic fluid. Negative sonographic Murphy's sign.  Common bile duct:  Normal. 6 mm maximum diameter.  Liver:  The liver is slightly prominent. Periportal structures are slightly echogenic which is nonspecific but can be seen  with viral infections.  No focal lesions.  IVC:  Appears normal.  Pancreas:  Normal.  Spleen:  Normal.  6.7 cm in length.  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  No hydronephrosis.  Left Kidney:  11 cm in length.  Renal contour is slightly lobulated.  No hydronephrosis.  Abdominal aorta:  1.8 cm maximal diameter.  The distal abdominal aorta and aortic bifurcation are not visualized due to bowel gas.  There is a small right pleural effusion.  IMPRESSION:  1.  Slight prominence of the liver with slightly echogenic periportal structures, nonspecific, but this can be seen with viral infections. 2.  Small right pleural effusion.  Original Report Authenticated By: Gwynn Burly, M.D.   Dg Chest Port 1 View  10/03/2011  *RADIOLOGY REPORT*  Clinical Data: ARDS, shortness of breath.  PORTABLE CHEST - 1 VIEW  Comparison: 10/02/2011  Findings: Support devices are unchanged.  Cardiomegaly.  Bilateral airspace opacities and effusions are again noted, unchanged.  No acute bony abnormality.  IMPRESSION: No significant change.  Original Report Authenticated By: Cyndie Chime, M.D.   Dg Chest Port 1 View  10/02/2011  *RADIOLOGY REPORT*  Clinical Data: Progression of infiltrates.  PORTABLE CHEST - 1 VIEW  Comparison: 10/01/2011.  Findings: Endotracheal tube 55 mm from the carina.  Enteric tube remains present.  Right IJ central line with the tip likely at the confluence of the brachiocephalic veins or in the distal right internal jugular.  Shifting bilateral airspace disease. Monitoring leads are projected over the chest.  Cardiopericardial silhouette grossly unchanged, partially obscured by airspace disease.  Small left pleural effusion.  IMPRESSION:  1.  Stable support apparatus. 2.  Shifting airspace disease which may represent edema, pneumonia or a combination.  Original Report Authenticated By: Andreas Newport, M.D.    ASSESSMENT AND PLAN  PULMONARY  Lab 10/01/11 2152 10/01/11 2023 10/01/11 1444 09/30/11  2311 09/26/11 1024  PHART 7.382 7.392 7.390 7.425 7.395  PCO2ART 56.2* 54.0* 53.6* 44.1 38.0  PO2ART 78.0* 69.0* 57.0* 72.8* 196.0*  HCO3 33.0* 32.3* 32.5* 28.4* 23.4  O2SAT 94.0 91.0 88.0 94.5 100.0   Ventilator Settings: on Nasal cannulae Vent Mode:  [-] PRVC FiO2 (%):  [49.6 %-100 %] 100 % Set Rate:  [24 bmp] 24 bmp Vt Set:  [300 mL] 300 mL PEEP:  [8 cmH20-10 cmH20] 8 cmH20 Plateau Pressure:  [15 cmH20-24 cmH20] 24 cmH20  ETT:   7/28 >>> 7/29 7/30 >>>  A:  Empyema s/p decortication.  Pneumonia.  Acute respiratory failure.  ? ARDS / pulmonary edema given interstitial infiltrates / increased oxygen requirements. P:   Titrate PEEP/FiO2 to keep SpO2 > 88%, PaO2 > 60 mmHg F/u CXR Continue diuresis as tolerate to keep in negative fluid balance Discussed possible need for trach>>Mother would be agreeable to this  CARDIOVASCULAR  Lines: Rt IJ CVL 7/28>>8/05 Lt PICC 8/05>>  A: Generalized edema P:  Give lasix 40 mg IV x 1 on 8/06  RENAL  Lab 10/03/11 0430 10/02/11 0415 10/01/11 1130 10/01/11 0400 09/30/11 0432 09/29/11 0500  NA 139 141 -- 141 141 143  K 3.8 3.9 -- -- -- --  CL 97 99 -- 103 104 104  CO2 39* 36* -- 31 31 29   BUN 26* 22 -- 24* 25* 28*  CREATININE 0.99 0.98 -- 0.86 0.92 0.94  CALCIUM 7.8* 7.8* -- 7.6* 7.5* 8.2*  MG -- -- -- -- 1.9 2.1  PHOS -- -- 3.1 -- 1.5* 2.5   Intake/Output      08/05 0701 - 08/06 0700 08/06 0701 - 08/07 0700   I.V. (mL/kg) 320 (2.9)    NG/GT 855 55   IV Piggyback 1350 12.5   Total Intake(mL/kg) 2525 (23.3) 67.5 (0.6)   Urine (mL/kg/hr) 2655 (1) 75   Stool 200    Total Output 2855 75   Net -330 -7.5           A:  Hypokalemia. P:   F/u and replace electrolytes while getting lasix  GASTROINTESTINAL  Lab 10/03/11 0430 10/02/11 0415 10/01/11 0400 09/28/11 0430 09/27/11 0415  AST 73* 80* 76* 60* 14  ALT 155* 199* 241* 74* 40*  ALKPHOS 129* 157* 175* 93 85  BILITOT 0.2* 0.3 0.3 0.1* 0.2*  PROT 5.5* 6.0 5.1* 5.5* 5.2*    ALBUMIN 1.3* 1.4* 1.3* 1.6* 1.4*   Abd u/s 8/05>>non-specific slight echogenic periportal structures ?viral  A:  Nutrition.  Increased LFT. P:   Check acute hepatitis panel F/u LFT's intermittently Continue tube feeds   HEMATOLOGIC  Lab 10/03/11 0430 10/02/11 0415 10/01/11 0400 09/30/11 0432 09/29/11 0500  HGB 8.7* 8.9* 8.7* 8.7* 10.6*  HCT 27.3* 27.5* 27.0* 26.6* 32.8*  PLT 233 246 219 233 302  INR -- -- -- -- --  APTT -- -- -- -- --   A:  Mild anemia, stable. P:  F/u CBC intermittently Transfuse for Hb < 7  INFECTIOUS  Lab 10/03/11 0430 10/02/11 0415 10/01/11 1130 10/01/11 0400 09/30/11 0432 09/29/11 0500 09/27/11 0415  WBC 13.2* 16.4* -- 16.1* 13.0* 25.7* --  PROCALCITON -- -- 0.29 -- -- --  0.26   Cultures: 7/28  Blood >>Diphtheroids 7/28  Pleural fluid >>negative 7/28 Sputum>>negative 8/04 Sputum>>  Antibiotics: Rocephin (CAP) 7/28 >>> 8/4 Azithromycin  7/28 >>> 8/1 Vanc (Empyema) 7/28 >>> 8/4 Zosyn 8/4 >>> Cipro 8/4 >>> Zyvox 8/4 >>>  A:  Community acquired pneumonia with empyema, remains culture negative.  Has persistent fever>>?VAP vs Line.  Fever better after CVL change 8/05. P:   Continue zyvox, cipro, zosyn for now>>if cx negative, then likely decrease Abx soon  ENDOCRINE  Lab 10/03/11 0812 10/03/11 0415 10/03/11 0004 10/02/11 2004 10/02/11 1613  GLUCAP 113* 118* 144* 131* 106*   A:  Hyperglycemia.  Hx of hypothyroidism. P:   SSI/CBG Synthroid  NEUROLOGIC  A:  Mild dementia due to Down syndrome.  Acute encephalopathy due to sedation.  P:   Intermittent sedation Namenda, Buspar, as preadmission Hold Prozac as risk of serotonin syndrome with Zyvox Daily WUA  BEST PRACTICE / DISPOSITION Level of Care:  ICU Primary Service:  PCCM  Consultants: TCTS Cornelius Moras) Code Status:  Full Diet:  TF DVT Px:  Heparin GI Px:  Protonix Skin Integrity:  Intact Social / Family:  Family updated at bedside.  CC time 35 min.  Coralyn Helling, MD [facility]  Pulmonary/Critical Care 10/03/2011, 9:54 AM Pager:  [phone] After 3pm call: [phone]

## 2011-10-03 NOTE — Progress Notes (Signed)
Nutrition Follow-up  Intervention:  Continue current enteral nutrition regimen  Assessment:   Patient remains intubated. Currently receiving Oxepa at 25 ml/hr via OGT with Prostat 30 ml 4 times daily, which provides 1300 kcal, 98 g protein, and 471 ml free water. This meets 68% estimated calorie needs and 100% estimated protein needs. She is tolerating well. She is also receiving liquid MVI daily.   Diet Order:  NPO  Meds: Scheduled Meds:   . antiseptic oral rinse  15 mL Mouth Rinse QID  . bisacodyl  10 mg Oral Daily  . busPIRone  15 mg Oral BID  . chlorhexidine  15 mL Mouth Rinse BID  . ciprofloxacin  400 mg Intravenous Q8H  . feeding supplement  30 mL Per Tube QID  . furosemide  40 mg Intravenous Q8H  . furosemide  40 mg Intravenous Once  . heparin subcutaneous  5,000 Units Subcutaneous Q8H  . insulin aspart  0-24 Units Subcutaneous Q4H  . levothyroxine  112 mcg Oral QAC breakfast  . lidocaine  1 patch Transdermal Q24H  . linezolid  600 mg Intravenous Q12H  . memantine  10 mg Oral BID  . multivitamin  5 mL Per Tube Daily  . pantoprazole sodium  40 mg Per Tube Q1200  . piperacillin-tazobactam (ZOSYN)  IV  3.375 g Intravenous Q8H  . potassium chloride  40 mEq Per Tube Q8H  . potassium chloride  40 mEq Per Tube Once  . sodium chloride  10-40 mL Intracatheter Q12H  . DISCONTD: levothyroxine  50 mcg Intravenous Daily  . DISCONTD: levothyroxine  112 mcg Oral QAC breakfast  . DISCONTD: mupirocin ointment   Nasal BID   Continuous Infusions:   . sodium chloride 20 mL/hr at 10/02/11 1400  . feeding supplement (OXEPA) 1,000 mL (10/02/11 1404)   PRN Meds:.acetaminophen (TYLENOL) oral liquid 160 mg/5 mL, fentaNYL, ipratropium, midazolam, sodium chloride  Labs:  CMP     Component Value Date/Time   NA 139 10/03/2011 0430   K 3.8 10/03/2011 0430   CL 97 10/03/2011 0430   CO2 39* 10/03/2011 0430   GLUCOSE 146* 10/03/2011 0430   BUN 26* 10/03/2011 0430   CREATININE 0.99 10/03/2011 0430   CALCIUM 7.8* 10/03/2011 0430   PROT 5.5* 10/03/2011 0430   ALBUMIN 1.3* 10/03/2011 0430   AST 73* 10/03/2011 0430   ALT 155* 10/03/2011 0430   ALKPHOS 129* 10/03/2011 0430   BILITOT 0.2* 10/03/2011 0430   GFRNONAA 70* 10/03/2011 0430   GFRAA 81* 10/03/2011 0430     Intake/Output Summary (Last 24 hours) at 10/03/11 1340 Last data filed at 10/03/11 0800  Gross per 24 hour  Intake 1817.5 ml  Output   2100 ml  Net -282.5 ml    Weight Status:  108.6 kg, down from 112.8 kg on 8/1  Re-estimated needs:   1900 kcal 90-100 g protein  Nutrition Dx:  Inadequate oral intake, ongoing  Goal:  Enteral nutrition to provide 60-70% of estimated calorie needs (22-25 kcals/kg ideal body weight) and >/= 90% of estimated protein needs, based on ASPEN guidelines for permissive underfeeding in critically ill obese individuals, met   Monitor:  TF tolerance/adequacy, weight, labs   Linnell Fulling, RD, LDN Pager #: [phone] After-Hours Pager #: [phone]

## 2011-10-03 NOTE — Progress Notes (Addendum)
[address]            [place] [address]          [phone]     9 Days Post-Op Procedure(s) (LRB): VIDEO ASSISTED THORACOSCOPY (VATS)/THOROCOTOMY (Left) FLEXIBLE BRONCHOSCOPY (N/A)  Subjective: Sedated on vent. Reportedly had a large amount of serous drainage from CT site yesterday.  Objective: Vital signs in last 24 hours: Patient Vitals for the past 24 hrs:  BP Temp Temp src Pulse Resp SpO2 Weight  10/03/11 0830 98/58 mmHg - - 92  24  89 % -  10/03/11 0826 98/58 mmHg - - 98  24  91 % -  10/03/11 0820 - 100.1 F (37.8 C) Oral - - - -  10/03/11 0800 93/48 mmHg - - 90  24  96 % -  10/03/11 0700 93/49 mmHg - - 90  24  97 % -  10/03/11 0600 90/45 mmHg - - 84  23  97 % 239 lb 6.7 oz (108.6 kg)  10/03/11 0500 92/47 mmHg - - 83  23  98 % -  10/03/11 0420 - 100.1 F (37.8 C) Oral - - - -  10/03/11 0400 96/48 mmHg - - 89  24  96 % -  10/03/11 0300 91/47 mmHg - - 86  24  97 % -  10/03/11 0200 93/45 mmHg - - 90  23  96 % -  10/03/11 0129 99/54 mmHg - - 113  43  97 % -  10/03/11 0100 94/49 mmHg - - 87  24  97 % -  10/03/11 0005 - 99.8 F (37.7 C) Oral - - - -  10/03/11 0000 96/47 mmHg - - 85  24  96 % -  10/02/11 2300 96/47 mmHg - - 87  24  97 % -  10/02/11 2200 90/49 mmHg - - 83  27  97 % -  10/02/11 2100 100/51 mmHg - - 96  30  94 % -  10/02/11 2000 98/45 mmHg - - 96  25  94 % -  10/02/11 1900 92/42 mmHg - - 94  25  93 % -  10/02/11 1800 92/42 mmHg - - 91  24  96 % -  10/02/11 1700 91/42 mmHg - - 91  24  96 % -  10/02/11 1600 99/41 mmHg 99.1 F (37.3 C) Oral 92  23  94 % -  10/02/11 1537 105/48 mmHg - - 87  24  97 % -  10/02/11 1500 107/48 mmHg - - 86  23  97 % -  10/02/11 1400 95/47 mmHg - - 81  24  96 % -  10/02/11 1300 98/49 mmHg - - 84  24  96 % -  10/02/11 1200 103/45 mmHg - - 83  23  96 % -  10/02/11 1143 107/54 mmHg - - 85  24  99 % -  10/02/11 1130 - 100 F (37.8 C) Oral - - - -  10/02/11 1100 112/53 mmHg - - 94  25  99 % -    10/02/11 1000 100/47 mmHg - - 93  25  99 % -  10/02/11 0900 103/54 mmHg - - 95  25  98 % -   Current Weight  10/03/11 239 lb 6.7 oz (108.6 kg)     Intake/Output from previous day: 08/05 0701 - 08/06 0700 In: 2525 [I.V.:320;  NG/GT:855; IV Piggyback:1350] Out: 2855 [Urine:2655; Stool:200]    PHYSICAL EXAM:  Heart: RRR Lungs: exp wheezes and rhonchi bilaterally Wound: clean and dry, small amount serous drainage from anterior CT site, not draining at present     Lab Results: CBC: Basename 10/03/11 0430 10/02/11 0415  WBC 13.2* 16.4*  HGB 8.7* 8.9*  HCT 27.3* 27.5*  PLT 233 246   BMET:  Basename 10/03/11 0430 10/02/11 0415  NA 139 141  K 3.8 3.9  CL 97 99  CO2 39* 36*  GLUCOSE 146* 113*  BUN 26* 22  CREATININE 0.99 0.98  CALCIUM 7.8* 7.8*    PT/INR: No results found for this basename: LABPROT,INR in the last 72 hours  CXR: Findings: Support devices are unchanged. Cardiomegaly. Bilateral  airspace opacities and effusions are again noted, unchanged. No  acute bony abnormality.  IMPRESSION:  No significant change.   Assessment/Plan: S/P Procedure(s) (LRB): VIDEO ASSISTED THORACOSCOPY (VATS)/THOROCOTOMY (Left) FLEXIBLE BRONCHOSCOPY (N/A) Stable from thoracic surgery standpoint. Continue care per primary service.   LOS: 9 days    COLLINS,GINA H 10/03/2011   I have seen and examined the patient and agree with the assessment and plan as outlined.  Karan Inclan H 10/06/2011 7:31 AM

## 2011-10-04 ENCOUNTER — Inpatient Hospital Stay (HOSPITAL_COMMUNITY): Payer: Medicare Other

## 2011-10-04 LAB — CBC
HCT: 27.7 % — ABNORMAL LOW (ref 36.0–46.0)
Hemoglobin: 8.9 g/dL — ABNORMAL LOW (ref 12.0–15.0)
MCH: 33.7 pg (ref 26.0–34.0)
MCHC: 32.1 g/dL (ref 30.0–36.0)
MCV: 104.9 fL — ABNORMAL HIGH (ref 78.0–100.0)
Platelets: 222 K/uL (ref 150–400)
RBC: 2.64 MIL/uL — ABNORMAL LOW (ref 3.87–5.11)
RDW: 14.7 % (ref 11.5–15.5)
WBC: 10.9 K/uL — ABNORMAL HIGH (ref 4.0–10.5)

## 2011-10-04 LAB — GLUCOSE, CAPILLARY: Glucose-Capillary: 115 mg/dL — ABNORMAL HIGH (ref 70–99)

## 2011-10-04 LAB — BASIC METABOLIC PANEL WITH GFR
BUN: 25 mg/dL — ABNORMAL HIGH (ref 6–23)
CO2: 31 meq/L (ref 19–32)
Calcium: 6.5 mg/dL — ABNORMAL LOW (ref 8.4–10.5)
Chloride: 74 meq/L — ABNORMAL LOW (ref 96–112)
Creatinine, Ser: 0.82 mg/dL (ref 0.50–1.10)
GFR calc Af Amer: >90 mL/min
GFR calc non Af Amer: 88 mL/min — ABNORMAL LOW
Glucose, Bld: 869 mg/dL (ref 70–99)
Potassium: 3.1 meq/L — ABNORMAL LOW (ref 3.5–5.1)
Sodium: 113 meq/L — CL (ref 135–145)

## 2011-10-04 LAB — BASIC METABOLIC PANEL
BUN: 29 mg/dL — ABNORMAL HIGH (ref 6–23)
Creatinine, Ser: 0.97 mg/dL (ref 0.50–1.10)
GFR calc Af Amer: 83 mL/min — ABNORMAL LOW (ref >90)
GFR calc non Af Amer: 72 mL/min — ABNORMAL LOW (ref >90)
Potassium: 3.7 mEq/L (ref 3.5–5.1)

## 2011-10-04 MED ORDER — PROPOFOL 10 MG/ML IV EMUL: 5.0000 ug/kg/min | Freq: Once | INTRAVENOUS | Status: DC

## 2011-10-04 MED ORDER — POTASSIUM CHLORIDE 20 MEQ/15ML (10%) PO LIQD
40.0000 meq | Freq: Once | ORAL | Status: AC
  Administered 2011-10-04: 40 meq
  Filled 2011-10-04: qty 30

## 2011-10-04 MED ORDER — VECURONIUM BROMIDE 10 MG IV SOLR: 10.0000 mg | Freq: Once | INTRAVENOUS | Status: DC

## 2011-10-04 MED ORDER — MORPHINE SULFATE 2 MG/ML IJ SOLN
INTRAMUSCULAR | Status: AC
  Filled 2011-10-04: qty 1

## 2011-10-04 MED ORDER — SODIUM CHLORIDE 0.9 % IV SOLN
50.0000 ug/h | INTRAVENOUS | Status: DC
  Administered 2011-10-04 – 2011-10-06 (×3): 50 ug/h via INTRAVENOUS
  Administered 2011-10-07: 25 ug/h via INTRAVENOUS
  Administered 2011-10-08 – 2011-10-10 (×4): 50 ug/h via INTRAVENOUS
  Administered 2011-10-12: 75 ug/h via INTRAVENOUS
  Filled 2011-10-04 (×5): qty 50

## 2011-10-04 MED ORDER — FENTANYL CITRATE 0.05 MG/ML IJ SOLN: 200.0000 ug | Freq: Once | INTRAMUSCULAR | Status: DC

## 2011-10-04 MED ORDER — FUROSEMIDE 10 MG/ML IJ SOLN
INTRAMUSCULAR | Status: AC
  Filled 2011-10-04: qty 4

## 2011-10-04 MED ORDER — ETOMIDATE 2 MG/ML IV SOLN
40.0000 mg | Freq: Once | INTRAVENOUS | Status: DC
  Filled 2011-10-04: qty 20

## 2011-10-04 MED ORDER — SODIUM CHLORIDE 0.9 % IV SOLN
2.0000 mg/h | INTRAVENOUS | Status: DC
  Administered 2011-10-04: 2 mg/h via INTRAVENOUS
  Administered 2011-10-05: 1 mg/h via INTRAVENOUS
  Administered 2011-10-06: 2 mg/h via INTRAVENOUS
  Administered 2011-10-06: 1 mg/h via INTRAVENOUS
  Administered 2011-10-12: 2 mg/h via INTRAVENOUS
  Filled 2011-10-04 (×8): qty 10

## 2011-10-04 MED ORDER — FUROSEMIDE 8 MG/ML PO SOLN
40.0000 mg | Freq: Once | ORAL | Status: AC
  Administered 2011-10-04: 40 mg
  Filled 2011-10-04: qty 5

## 2011-10-04 MED ORDER — MIDAZOLAM HCL 2 MG/2ML IJ SOLN: 5.0000 mg | Freq: Once | INTRAMUSCULAR | Status: DC

## 2011-10-04 MED ORDER — FENTANYL BOLUS VIA INFUSION
50.0000 ug | Freq: Four times a day (QID) | INTRAVENOUS | Status: DC | PRN
  Administered 2011-10-06 – 2011-10-09 (×2): 25 ug via INTRAVENOUS
  Filled 2011-10-04: qty 100

## 2011-10-04 MED ORDER — MIDAZOLAM BOLUS VIA INFUSION
1.0000 mg | INTRAVENOUS | Status: DC | PRN
  Administered 2011-10-06: 1 mg via INTRAVENOUS
  Filled 2011-10-04 (×2): qty 2

## 2011-10-04 NOTE — Progress Notes (Signed)
Name: [name] MRN: [id number] DOB: 09-Sep-1970    LOS: 10  Referring Provider:  [facility] Reason for Referral:  Postop respiratory failure  PULMONARY / CRITICAL CARE MEDICINE  Brief patient description:  41 year old white female Down's patient with pneumonia and pleural effusion, s/p decortication for empyema, post op resp failure.  Events Since Admission: 7/28  Failed thoracentesis due to loculations, decrtication for empyema, reintubated in PACU 7/29  Extubated 7/30  Reintubated, hypoxia, distress 7/31  Removal chest tube 8/4    Increased oxygen requirements / WBC; ARDS vs edema; ARDS protocol, ABx changes, Lasix trial  Current Status:   Weaning FiO2, but still on increased PEEP.  No recurrent of fever in past.  No further fluid leakage from chest tube site.  Vital Signs: Temp:  [98 F (36.7 C)-98.6 F (37 C)] 98.2 F (36.8 C) (08/07 0755) Pulse Rate:  [77-124] 85  (08/07 1254) Resp:  [15-35] 28  (08/07 1200) BP: (92-112)/(38-56) 101/54 mmHg (08/07 1254) SpO2:  [90 %-98 %] 98 % (08/07 1200) FiO2 (%):  [40 %-50.3 %] 40 % (08/07 1254) Weight:  [236 lb 1.8 oz (107.1 kg)] 236 lb 1.8 oz (107.1 kg) (08/07 0444)  Physical Examination: General:  Ventilated, intermittently agitated Neuro: Sedated HEENT: ETT, OG in place, enlarged tongue Neck:  Rt IJ site clean Cardiovascular:  s1s2 regular Lungs:  Decreased breath sounds, scattered rhonchi Abdomen:  Soft nontender no organomegaly Musculoskeletal:  Generalized edema Skin:  Intact  Dg Chest Port 1 View  10/04/2011  *RADIOLOGY REPORT*  Clinical Data: Intubated patient.  PORTABLE CHEST - 1 VIEW  Comparison: Plain film chest 10/02/2011 and 10/03/2011.  Findings: Support tubes and lines are unchanged.  Bilateral airspace disease and left pleural effusion persist without marked change.  No pneumothorax identified.  IMPRESSION: No interval change in bilateral airspace disease and left effusion.  Original Report Authenticated By: Bernadene Bell.  Maricela Curet, M.D.   Dg Chest Port 1 View  10/03/2011  *RADIOLOGY REPORT*  Clinical Data: ARDS, shortness of breath.  PORTABLE CHEST - 1 VIEW  Comparison: 10/02/2011  Findings: Support devices are unchanged.  Cardiomegaly.  Bilateral airspace opacities and effusions are again noted, unchanged.  No acute bony abnormality.  IMPRESSION: No significant change.  Original Report Authenticated By: Cyndie Chime, M.D.    ASSESSMENT AND PLAN  PULMONARY  Lab 10/01/11 2152 10/01/11 2023 10/01/11 1444 09/30/11 2311  PHART 7.382 7.392 7.390 7.425  PCO2ART 56.2* 54.0* 53.6* 44.1  PO2ART 78.0* 69.0* 57.0* 72.8*  HCO3 33.0* 32.3* 32.5* 28.4*  O2SAT 94.0 91.0 88.0 94.5   Ventilator Settings: on Nasal cannulae Vent Mode:  [-] PRVC FiO2 (%):  [40 %-50.3 %] 40 % Set Rate:  [24 bmp] 24 bmp Vt Set:  [300 mL] 300 mL PEEP:  [8 cmH20] 8 cmH20 Plateau Pressure:  [20 cmH20-25 cmH20] 25 cmH20  ETT:   7/28 >>> 7/29 7/30 >>>  A:  Empyema s/p decortication.  Pneumonia.  Acute respiratory failure.  ? ARDS / pulmonary edema given interstitial infiltrates / increased oxygen requirements. P:   For trach 8/08 Titrate PEEP/FiO2 to keep SpO2 > 88%, PaO2 > 60 mmHg F/u CXR after trach Continue diuresis as tolerate to keep in negative fluid balance  CARDIOVASCULAR  Lines: Rt IJ CVL 7/28>>8/05 Lt PICC 8/05>>  A: Generalized edema P:  Give lasix 40 mg per tube x 1 on 8/07  RENAL  Lab 10/04/11 0900 10/04/11 0500 10/03/11 0430 10/02/11 0415 10/01/11 1130 10/01/11 0400 09/30/11 0432 09/29/11 0500  NA 136 113* 139 141 -- 141 -- --  K 3.7 3.1* -- -- -- -- -- --  CL 93* 74* 97 99 -- 103 -- --  CO2 39* 31 39* 36* -- 31 -- --  BUN 29* 25* 26* 22 -- 24* -- --  CREATININE 0.97 0.82 0.99 0.98 -- 0.86 -- --  CALCIUM 8.1* 6.5* 7.8* 7.8* -- 7.6* -- --  MG -- -- -- -- -- -- 1.9 2.1  PHOS -- -- -- -- 3.1 -- 1.5* 2.5   Intake/Output      08/06 0701 - 08/07 0700 08/07 0701 - 08/08 0700   I.V. (mL/kg) 319 (3) 20 (0.2)     NG/GT 635 245   IV Piggyback 1125 337.5   Total Intake(mL/kg) 2079 (19.4) 602.5 (5.6)   Urine (mL/kg/hr) 2735 (1.1) 120 (0.2)   Stool 200    Total Output 2935 120   Net -856 +482.5           A:  Hypokalemia. P:   F/u and replace electrolytes while getting lasix  GASTROINTESTINAL  Lab 10/03/11 0430 10/02/11 0415 10/01/11 0400 09/28/11 0430  AST 73* 80* 76* 60*  ALT 155* 199* 241* 74*  ALKPHOS 129* 157* 175* 93  BILITOT 0.2* 0.3 0.3 0.1*  PROT 5.5* 6.0 5.1* 5.5*  ALBUMIN 1.3* 1.4* 1.3* 1.6*   Abd u/s 8/05>>non-specific slight echogenic periportal structures ?viral  A:  Nutrition.  Increased LFT. P:   Check acute hepatitis panel F/u LFT's intermittently Continue tube feeds   HEMATOLOGIC  Lab 10/04/11 0500 10/03/11 1800 10/03/11 0430 10/02/11 0415 10/01/11 0400 09/30/11 0432  HGB 8.9* -- 8.7* 8.9* 8.7* 8.7*  HCT 27.7* -- 27.3* 27.5* 27.0* 26.6*  PLT 222 -- 233 246 219 233  INR -- 1.26 -- -- -- --  APTT -- 34 -- -- -- --   A:  Mild anemia, stable. P:  F/u CBC intermittently Transfuse for Hb < 7  INFECTIOUS  Lab 10/04/11 0500 10/03/11 0430 10/02/11 0415 10/01/11 1130 10/01/11 0400 09/30/11 0432  WBC 10.9* 13.2* 16.4* -- 16.1* 13.0*  PROCALCITON -- -- -- 0.29 -- --   Cultures: 7/28  Blood >>Diphtheroids 7/28  Pleural fluid >>negative 7/28 Sputum>>negative 8/04 Sputum>>Candida 8/05 Urine>>negative  Antibiotics: Rocephin (CAP) 7/28 >>> 8/4 Azithromycin  7/28 >>> 8/1 Vanc (Empyema) 7/28 >>> 8/4 Zosyn 8/4 >>>8/07 Cipro 8/4 >>>8/07 Zyvox 8/4 >>>8/07  A:  Community acquired pneumonia with empyema, remains culture negative.  Has persistent fever>>?VAP vs Line.  Fever better after CVL change 8/05. P:   Will d/c Abx and monitor  ENDOCRINE  Lab 10/04/11 0743 10/04/11 0410 10/04/11 0028 10/03/11 2024 10/03/11 1620  GLUCAP 115* 123* 117* 132* 117*   A:  Hyperglycemia.  Hx of hypothyroidism. P:   SSI/CBG Synthroid  NEUROLOGIC  A:  Mild dementia due  to Down syndrome.  Acute encephalopathy due to sedation.  P:   Intermittent sedation Namenda, Buspar, as preadmission Hold Prozac as risk of serotonin syndrome with Zyvox Daily WUA  BEST PRACTICE / DISPOSITION Level of Care:  ICU Primary Service:  PCCM  Consultants: TCTS Cornelius Moras) Code Status:  Full Diet:  TF DVT Px:  Heparin GI Px:  Protonix Skin Integrity:  Intact Social / Family:  Family updated at bedside.   Coralyn Helling, MD [facility] Pulmonary/Critical Care 10/04/2011, 1:22 PM Pager:  [phone] After 3pm call: [phone]

## 2011-10-04 NOTE — Progress Notes (Signed)
eLink Physician-Brief Progress Note Patient Name: [name] DOB: 01-28-1971 MRN: [id number]  Date of Service  10/04/2011   HPI/Events of Note   Agitated on prn sedation per rN  eICU Interventions  Continuous sedatin orders sent   Intervention Category Major Interventions: Other:  Lenorris Karger 10/04/2011, 11:19 PM

## 2011-10-04 NOTE — Significant Event (Signed)
Spoke with pt's mother over phone.  Reviewed procedure for tracheostomy at bedside.  Explained risks as bleeding, lose of airway, and infection.    Verbal consent provided over the phone by pt's mother.  Coralyn Helling, MD [facility] Pulmonary/Critical Care 10/04/2011, 2:42 PM Pager:  [phone] After 3pm call: [phone]

## 2011-10-04 NOTE — Progress Notes (Signed)
CRITICAL VALUE ALERT  Critical value received:  Na+ 113, glucose 869  Date of notification:  10/04/2011  Time of notification:  6:50 AM   Critical value read back:yes  Nurse who received alert:  Vassie Moselle     MD notified (1st page):  Loraine Leriche, RN for Ramaswamy  Time of first page:  6:54 AM   MD notified (2nd page):   Time of second page:  Responding MD:  Loraine Leriche, RN for Marchelle Gearing  Time MD responded:  6:54 AM

## 2011-10-05 ENCOUNTER — Encounter (HOSPITAL_COMMUNITY): Payer: Medicare Other

## 2011-10-05 LAB — COMPREHENSIVE METABOLIC PANEL
Albumin: 1.4 g/dL — ABNORMAL LOW (ref 3.5–5.2)
Alkaline Phosphatase: 99 U/L (ref 39–117)
BUN: 30 mg/dL — ABNORMAL HIGH (ref 6–23)
Calcium: 8 mg/dL — ABNORMAL LOW (ref 8.4–10.5)
Potassium: 3.9 mEq/L (ref 3.5–5.1)
Sodium: 138 mEq/L (ref 135–145)
Total Protein: 5.5 g/dL — ABNORMAL LOW (ref 6.0–8.3)

## 2011-10-05 LAB — CBC
HCT: 27.3 % — ABNORMAL LOW (ref 36.0–46.0)
Hemoglobin: 8.7 g/dL — ABNORMAL LOW (ref 12.0–15.0)
MCV: 103.4 fL — ABNORMAL HIGH (ref 78.0–100.0)
RDW: 14.7 % (ref 11.5–15.5)
WBC: 11.8 10*3/uL — ABNORMAL HIGH (ref 4.0–10.5)

## 2011-10-05 LAB — GLUCOSE, CAPILLARY: Glucose-Capillary: 105 mg/dL — ABNORMAL HIGH (ref 70–99)

## 2011-10-05 LAB — IRON AND TIBC
Saturation Ratios: 23 % (ref 20–55)
TIBC: 155 ug/dL — ABNORMAL LOW (ref 250–470)
UIBC: 120 ug/dL — ABNORMAL LOW (ref 125–400)

## 2011-10-05 MED ORDER — POTASSIUM CHLORIDE 20 MEQ/15ML (10%) PO LIQD
40.0000 meq | Freq: Once | ORAL | Status: AC
  Administered 2011-10-05: 40 meq
  Filled 2011-10-05: qty 30

## 2011-10-05 MED ORDER — FUROSEMIDE 8 MG/ML PO SOLN
40.0000 mg | Freq: Once | ORAL | Status: AC
  Administered 2011-10-05: 40 mg
  Filled 2011-10-05: qty 5

## 2011-10-05 MED ORDER — NYSTATIN 100000 UNIT/GM EX POWD
Freq: Two times a day (BID) | CUTANEOUS | Status: DC
  Administered 2011-10-05 – 2011-10-11 (×13): via TOPICAL
  Filled 2011-10-05 (×2): qty 15

## 2011-10-05 NOTE — Progress Notes (Signed)
Name: LOREL LEMBO MRN: [id number] DOB: Jun 03, 1970    LOS: 11  Referring Provider:  [facility] Reason for Referral:  Postop respiratory failure  PULMONARY / CRITICAL CARE MEDICINE  Brief patient description:  41 year old white female Down's patient with pneumonia and pleural effusion, s/p decortication for empyema, post op resp failure.  Events Since Admission: 7/28  Failed thoracentesis due to loculations, decrtication for empyema, reintubated in PACU 7/29  Extubated 7/30  Reintubated, hypoxia, distress 7/31  Removal chest tube 8/4    Increased oxygen requirements / WBC; ARDS vs edema; ARDS protocol, ABx changes, Lasix trial  Current Status:   Plan for trach 8/08  Vital Signs: Temp:  [98.4 F (36.9 C)-99 F (37.2 C)] 98.4 F (36.9 C) (08/08 0759) Pulse Rate:  [75-129] 75  (08/08 0821) Resp:  [23-35] 24  (08/08 0821) BP: (91-115)/(39-92) 98/46 mmHg (08/08 0821) SpO2:  [90 %-100 %] 97 % (08/08 0821) FiO2 (%):  [39.8 %-64.7 %] 39.9 % (08/08 0821) Weight:  [233 lb 11 oz (106 kg)] 233 lb 11 oz (106 kg) (08/08 0600)  Physical Examination: General:  Ventilated, intermittently agitated Neuro: Sedated HEENT: ETT, OG in place, enlarged tongue, rash around neck Neck:  Rt IJ site clean Cardiovascular:  s1s2 regular Lungs:  Decreased breath sounds, scattered rhonchi Abdomen:  Soft nontender no organomegaly Musculoskeletal:  ankle edema Skin:  Intact  Dg Chest Port 1 View  10/04/2011  *RADIOLOGY REPORT*  Clinical Data: Intubated patient.  PORTABLE CHEST - 1 VIEW  Comparison: Plain film chest 10/02/2011 and 10/03/2011.  Findings: Support tubes and lines are unchanged.  Bilateral airspace disease and left pleural effusion persist without marked change.  No pneumothorax identified.  IMPRESSION: No interval change in bilateral airspace disease and left effusion.  Original Report Authenticated By: Bernadene Bell. D'ALESSIO, M.D.    ASSESSMENT AND PLAN  PULMONARY  Lab 10/01/11 2152 10/01/11 2023  10/01/11 1444 09/30/11 2311  PHART 7.382 7.392 7.390 7.425  PCO2ART 56.2* 54.0* 53.6* 44.1  PO2ART 78.0* 69.0* 57.0* 72.8*  HCO3 33.0* 32.3* 32.5* 28.4*  O2SAT 94.0 91.0 88.0 94.5   Ventilator Settings: on Nasal cannulae Vent Mode:  [-] PRVC FiO2 (%):  [39.8 %-64.7 %] 39.9 % Set Rate:  [24 bmp] 24 bmp Vt Set:  [300 mL] 300 mL PEEP:  [4.8 cmH20-8 cmH20] 4.8 cmH20 Plateau Pressure:  [14 cmH20-25 cmH20] 22 cmH20  ETT:   7/28 >>> 7/29 7/30 >>>  A:  Empyema s/p decortication.  Pneumonia.  Acute respiratory failure.  ? ARDS / pulmonary edema given interstitial infiltrates / increased oxygen requirements. P:   For trach 8/08 Titrate PEEP/FiO2 to keep SpO2 > 88%, PaO2 > 60 mmHg Adjust Vt to 7 cc/kg F/u CXR after trach Continue diuresis as tolerate to keep in negative fluid balance  CARDIOVASCULAR  Lines: Rt IJ CVL 7/28>>8/05 Lt PICC 8/05>>  A: Generalized edema P:  Give lasix 40 mg per tube x 1 on 8/08 after trach  RENAL  Lab 10/05/11 0405 10/04/11 0900 10/04/11 0500 10/03/11 0430 10/02/11 0415 10/01/11 1130 09/30/11 0432 09/29/11 0500  NA 138 136 113* 139 141 -- -- --  K 3.9 3.7 -- -- -- -- -- --  CL 94* 93* 74* 97 99 -- -- --  CO2 38* 39* 31 39* 36* -- -- --  BUN 30* 29* 25* 26* 22 -- -- --  CREATININE 0.96 0.97 0.82 0.99 0.98 -- -- --  CALCIUM 8.0* 8.1* 6.5* 7.8* 7.8* -- -- --  MG -- -- -- -- -- --  1.9 2.1  PHOS -- -- -- -- -- 3.1 1.5* 2.5   Intake/Output      08/07 0701 - 08/08 0700 08/08 0701 - 08/09 0700   I.V. (mL/kg) 338.5 (3.2)    NG/GT 775    IV Piggyback 800    Total Intake(mL/kg) 1913.5 (18.1)    Urine (mL/kg/hr) 2045 (0.8)    Stool 400    Total Output 2445    Net -531.5            A:  Hypokalemia. P:   F/u and replace electrolytes while getting lasix  GASTROINTESTINAL  Lab 10/05/11 0405 10/03/11 0430 10/02/11 0415 10/01/11 0400  AST 40* 73* 80* 76*  ALT 91* 155* 199* 241*  ALKPHOS 99 129* 157* 175*  BILITOT 0.2* 0.2* 0.3 0.3  PROT 5.5*  5.5* 6.0 5.1*  ALBUMIN 1.4* 1.3* 1.4* 1.3*   Abd u/s 8/05>>non-specific slight echogenic periportal structures ?viral  A:  Nutrition.  Increased LFT>>improving.  Acute hepatitis panel negative from 8/06. P:   F/u LFT's intermittently Continue tube feeds   HEMATOLOGIC  Lab 10/05/11 0405 10/04/11 0500 10/03/11 1800 10/03/11 0430 10/02/11 0415 10/01/11 0400  HGB 8.7* 8.9* -- 8.7* 8.9* 8.7*  HCT 27.3* 27.7* -- 27.3* 27.5* 27.0*  PLT 231 222 -- 233 246 219  INR -- -- 1.26 -- -- --  APTT -- -- 34 -- -- --   A:  Mild anemia, stable. P:  F/u CBC intermittently Transfuse for Hb < 7 Check anemia panel  INFECTIOUS  Lab 10/05/11 0405 10/04/11 0500 10/03/11 0430 10/02/11 0415 10/01/11 1130 10/01/11 0400  WBC 11.8* 10.9* 13.2* 16.4* -- 16.1*  PROCALCITON -- -- -- -- 0.29 --   Cultures: 7/28  Blood >>Diphtheroids 7/28  Pleural fluid >>negative 7/28 Sputum>>negative 8/04 Sputum>>Candida 8/05 Urine>>negative  Antibiotics: Rocephin (CAP) 7/28 >>> 8/4 Azithromycin  7/28 >>> 8/1 Vanc (Empyema) 7/28 >>> 8/4 Zosyn 8/4 >>>8/07 Cipro 8/4 >>>8/07 Zyvox 8/4 >>>8/07  A:  Community acquired pneumonia with empyema, remains culture negative.  Has persistent fever>>?VAP vs Line.  Fever better after CVL change 8/05.  Candidal rash around neck 8/08. P:   Added nystatin powder 8/08  ENDOCRINE  Lab 10/05/11 0420 10/05/11 0005 10/04/11 2010 10/04/11 1605 10/04/11 1307  GLUCAP 105* 117* 123* 113* 129*   A:  Hyperglycemia.  Hx of hypothyroidism. P:   SSI/CBG Synthroid  NEUROLOGIC  A:  Mild dementia due to Down syndrome.  Acute encephalopathy due to sedation.  P:   Changed to continuous sedation>>hopefully sedative needs will improve after vent changes and trach Namenda, Buspar, as preadmission Hold Prozac as risk of serotonin syndrome with Zyvox Daily WUA  BEST PRACTICE / DISPOSITION Level of Care:  ICU Primary Service:  PCCM  Consultants: TCTS Cornelius Moras) Code Status:  Full Diet:   TF DVT Px:  Heparin GI Px:  Protonix Skin Integrity:  Intact Social / Family:  No family at bedside.   Coralyn Helling, MD [facility] Pulmonary/Critical Care 10/05/2011, 8:39 AM Pager:  [phone] After 3pm call: [phone]

## 2011-10-06 ENCOUNTER — Inpatient Hospital Stay (HOSPITAL_COMMUNITY): Payer: Medicare Other

## 2011-10-06 LAB — BASIC METABOLIC PANEL
BUN: 28 mg/dL — ABNORMAL HIGH (ref 6–23)
CO2: 40 mEq/L (ref 19–32)
Chloride: 98 mEq/L (ref 96–112)
Creatinine, Ser: 1.02 mg/dL (ref 0.50–1.10)
Glucose, Bld: 124 mg/dL — ABNORMAL HIGH (ref 70–99)
Potassium: 3.9 mEq/L (ref 3.5–5.1)

## 2011-10-06 LAB — CBC
HCT: 26.5 % — ABNORMAL LOW (ref 36.0–46.0)
Hemoglobin: 8.4 g/dL — ABNORMAL LOW (ref 12.0–15.0)
MCH: 33.1 pg (ref 26.0–34.0)
MCHC: 31.7 g/dL (ref 30.0–36.0)
MCV: 104.3 fL — ABNORMAL HIGH (ref 78.0–100.0)

## 2011-10-06 LAB — GLUCOSE, CAPILLARY
Glucose-Capillary: 113 mg/dL — ABNORMAL HIGH (ref 70–99)
Glucose-Capillary: 119 mg/dL — ABNORMAL HIGH (ref 70–99)
Glucose-Capillary: 132 mg/dL — ABNORMAL HIGH (ref 70–99)

## 2011-10-06 MED ORDER — BISACODYL 5 MG PO TBEC: 10.0000 mg | DELAYED_RELEASE_TABLET | Freq: Every day | ORAL | Status: DC | PRN

## 2011-10-06 MED ORDER — FLUOXETINE HCL 20 MG/5ML PO SOLN
20.0000 mg | Freq: Every day | ORAL | Status: DC
  Administered 2011-10-06 – 2011-10-12 (×7): 20 mg
  Filled 2011-10-06 (×7): qty 5

## 2011-10-06 NOTE — Progress Notes (Signed)
Sats in 70's and 80's.  Pt very agitated, slid herself down in bed, flung legs out of bed and trying to get mitts off and tubes out.  Flapping arms rapidly.  Versed restarted and bolus given; Fentanyl increased and bolus given.  Sherrilyn Rist, charge nurse assisted in pulling pt back up in bed and re-turning onto left side.  Respiratory therapist called and placed pt back on vent settings from wean mode.

## 2011-10-06 NOTE — Progress Notes (Signed)
Placed back to full support due to aggitation and decrease in sp02

## 2011-10-06 NOTE — Progress Notes (Signed)
CRITICAL VALUE ALERT  Critical value received:  CO2-402  Date of notification:  10/06/11   Time of notification:  0520  Critical value read back:yes  Nurse who received alert:  Marcellina Millin, RN  MD notified (1st page):  Dr. Vassie Loll  Time of first page:  [phone]  MD notified (2nd page):  Time of second page:  Responding MD:  Dr. Vassie Loll  Time MD responded:  [phone]

## 2011-10-06 NOTE — Progress Notes (Signed)
Patient continues to wean on 5/10, tolerating well, cont to monitor.

## 2011-10-06 NOTE — Progress Notes (Signed)
Name: [name] MRN: [id number] DOB: 1971-02-11    LOS: 12  Referring Provider:  [facility] Reason for Referral:  Postop respiratory failure  PULMONARY / CRITICAL CARE MEDICINE  Brief patient description:  41 year old white female Down's patient with pneumonia and pleural effusion, s/p decortication for empyema, post op resp failure.  Events Since Admission: 7/28  Failed thoracentesis due to loculations, decrtication for empyema, reintubated in PACU 7/29  Extubated 7/30  Reintubated, hypoxia, distress 7/31  Removal chest tube 8/4    Increased oxygen requirements / WBC; ARDS vs edema; ARDS protocol, ABx changes, Lasix trial  Current Status:   Tolerating pressure support.  Decreased sedation needs.  Vital Signs: Temp:  [98.5 F (36.9 C)-100.4 F (38 C)] 100.1 F (37.8 C) (08/09 0800) Pulse Rate:  [77-102] 88  (08/09 1000) Resp:  [12-24] 21  (08/09 1000) BP: (83-106)/(36-72) 99/44 mmHg (08/09 1000) SpO2:  [93 %-97 %] 96 % (08/09 1000) FiO2 (%):  [39.8 %-40.5 %] 40 % (08/09 1137) Weight:  [235 lb 7.2 oz (106.8 kg)] 235 lb 7.2 oz (106.8 kg) (08/09 0600)  Physical Examination: General:  No distress Neuro: Sedated HEENT: ETT, OG in place, enlarged tongue, rash around neck>>much improved Neck:  Rt IJ site clean Cardiovascular:  s1s2 regular Lungs:  Decreased breath sounds, scattered rhonchi Abdomen:  Soft nontender no organomegaly Musculoskeletal:  ankle edema Skin:  Intact  Dg Chest Port 1 View  10/06/2011  *RADIOLOGY REPORT*  Clinical Data: Evaluate ARDS  PORTABLE CHEST - 1 VIEW  Comparison: 10/04/2011; 10/03/2011; 10/02/2011  Findings: Stable positioning of support apparatus.  There is persistent obscuration of the bilateral heart borders secondary to grossly unchanged extensive heterogeneous air space opacities, right slightly greater than left.  Pleural fluid is again seen layering along the peripheral aspect the left lung about the left lung apex.  A small/trace right-sided  effusion is also suspected. No definite pneumothorax.  Unchanged bones.  IMPRESSION: 1.  Stable positioning of support apparatus.  No pneumothorax. 2.  Grossly unchanged extensive bilateral airspace opacities and small left-sided effusion  Original Report Authenticated By: Waynard Reeds, M.D.    ASSESSMENT AND PLAN  PULMONARY  Lab 10/01/11 2152 10/01/11 2023 10/01/11 1444 09/30/11 2311  PHART 7.382 7.392 7.390 7.425  PCO2ART 56.2* 54.0* 53.6* 44.1  PO2ART 78.0* 69.0* 57.0* 72.8*  HCO3 33.0* 32.3* 32.5* 28.4*  O2SAT 94.0 91.0 88.0 94.5   Ventilator Settings: on Nasal cannulae Vent Mode:  [-] CPAP FiO2 (%):  [39.8 %-40.5 %] 40 % Set Rate:  [12 bmp] 12 bmp Vt Set:  [350 mL] 350 mL PEEP:  [5 cmH20] 5 cmH20 Pressure Support:  [10 cmH20] 10 cmH20 Plateau Pressure:  [16 cmH20-26 cmH20] 16 cmH20  ETT:   7/28 >>> 7/29 7/30 >>>  A:  Empyema s/p decortication.  Pneumonia.  Acute respiratory failure.  ? ARDS / pulmonary edema given interstitial infiltrates / increased oxygen requirements.  Probable OSA. P:   Will likely need trach next week after neck rash improved Titrate PEEP/FiO2 to keep SpO2 > 88%, PaO2 > 60 mmHg Pressure support wean as tolerated F/u CXR 8/11 Keep in even to negative fluid balance  CARDIOVASCULAR  Lines: Rt IJ CVL 7/28>>8/05 Lt PICC 8/05>>  A: Generalized edema>>improved. P:  Hold lasix 8/10  RENAL  Lab 10/06/11 0410 10/05/11 0405 10/04/11 0900 10/04/11 0500 10/03/11 0430 10/01/11 1130 09/30/11 0432  NA 141 138 136 113* 139 -- --  K 3.9 3.9 -- -- -- -- --  CL 98 94* 93* 74* 97 -- --  CO2 40* 38* 39* 31 39* -- --  BUN 28* 30* 29* 25* 26* -- --  CREATININE 1.02 0.96 0.97 0.82 0.99 -- --  CALCIUM 8.1* 8.0* 8.1* 6.5* 7.8* -- --  MG -- -- -- -- -- -- 1.9  PHOS -- -- -- -- -- 3.1 1.5*   Intake/Output      08/08 0701 - 08/09 0700 08/09 0701 - 08/10 0700   I.V. (mL/kg) 593 (5.6) 66.1 (0.6)   NG/GT 1190 345   IV Piggyback     Total Intake(mL/kg) 1783  (16.7) 411.1 (3.8)   Urine (mL/kg/hr) 988 (0.4) 95 (0.2)   Stool 250    Total Output 1238 95   Net +545 +316.1           A:  Hypokalemia. P:   F/u and replace electrolytes as needed.  GASTROINTESTINAL  Lab 10/05/11 0405 10/03/11 0430 10/02/11 0415 10/01/11 0400  AST 40* 73* 80* 76*  ALT 91* 155* 199* 241*  ALKPHOS 99 129* 157* 175*  BILITOT 0.2* 0.2* 0.3 0.3  PROT 5.5* 5.5* 6.0 5.1*  ALBUMIN 1.4* 1.3* 1.4* 1.3*   Abd u/s 8/05>>non-specific slight echogenic periportal structures ?viral  A:  Nutrition.  Increased LFT>>resolved.  Acute hepatitis panel negative from 8/06. P:   Continue tube feeds   HEMATOLOGIC  Lab 10/06/11 0410 10/05/11 0405 10/04/11 0500 10/03/11 1800 10/03/11 0430 10/02/11 0415  HGB 8.4* 8.7* 8.9* -- 8.7* 8.9*  HCT 26.5* 27.3* 27.7* -- 27.3* 27.5*  PLT 245 231 222 -- 233 246  INR -- -- -- 1.26 -- --  APTT -- -- -- 34 -- --   A:  Mild anemia, stable. P:  F/u CBC intermittently Transfuse for Hb < 7  INFECTIOUS  Lab 10/06/11 0410 10/05/11 0405 10/04/11 0500 10/03/11 0430 10/02/11 0415 10/01/11 1130  WBC 11.7* 11.8* 10.9* 13.2* 16.4* --  PROCALCITON -- -- -- -- -- 0.29   Cultures: 7/28  Blood >>Diphtheroids 7/28  Pleural fluid >>negative 7/28 Sputum>>negative 8/04 Sputum>>Candida 8/05 Urine>>negative  Antibiotics: Rocephin (CAP) 7/28 >>> 8/4 Azithromycin  7/28 >>> 8/1 Vanc (Empyema) 7/28 >>> 8/4 Zosyn 8/4 >>>8/07 Cipro 8/4 >>>8/07 Zyvox 8/4 >>>8/07  A:  Community acquired pneumonia with empyema, remains culture negative.  Has persistent fever>>?VAP vs Line.  Fever better after CVL change 8/05.  Candidal rash around neck 8/08>>improved 8/09. P:   Added nystatin powder 8/08  ENDOCRINE  Lab 10/06/11 0758 10/06/11 0407 10/06/11 0026 10/05/11 1958 10/05/11 1534  GLUCAP 119* 121* 113* 121* 116*   A:  Hyperglycemia.  Hx of hypothyroidism. P:   SSI/CBG Synthroid  NEUROLOGIC  A:  Mild dementia due to Down syndrome.  Acute  encephalopathy due to sedation.  P:   Limit sedation as tolerated Continue buspirone, namenda Resume prozac at 20 daily and increase as needed Daily WUA  BEST PRACTICE / DISPOSITION Level of Care:  ICU Primary Service:  PCCM  Consultants: TCTS Cornelius Moras) Code Status:  Full Diet:  TF DVT Px:  Heparin GI Px:  Protonix Skin Integrity:  Intact Social / Family:  No family at bedside.   Coralyn Helling, MD [facility] Pulmonary/Critical Care 10/06/2011, 11:42 AM Pager:  [phone] After 3pm call: [phone]

## 2011-10-07 ENCOUNTER — Inpatient Hospital Stay (HOSPITAL_COMMUNITY): Payer: Medicare Other

## 2011-10-07 LAB — GLUCOSE, CAPILLARY: Glucose-Capillary: 105 mg/dL — ABNORMAL HIGH (ref 70–99)

## 2011-10-07 LAB — BASIC METABOLIC PANEL
Calcium: 7.8 mg/dL — ABNORMAL LOW (ref 8.4–10.5)
GFR calc Af Amer: 88 mL/min — ABNORMAL LOW (ref >90)
GFR calc non Af Amer: 76 mL/min — ABNORMAL LOW (ref >90)
Glucose, Bld: 117 mg/dL — ABNORMAL HIGH (ref 70–99)
Sodium: 143 mEq/L (ref 135–145)

## 2011-10-07 MED ORDER — POTASSIUM CHLORIDE 20 MEQ/15ML (10%) PO LIQD
20.0000 meq | Freq: Once | ORAL | Status: AC
  Administered 2011-10-07: 20 meq
  Filled 2011-10-07: qty 30

## 2011-10-07 MED ORDER — FUROSEMIDE 10 MG/ML IJ SOLN
20.0000 mg | Freq: Once | INTRAMUSCULAR | Status: AC
  Administered 2011-10-07: 20 mg via INTRAVENOUS
  Filled 2011-10-07: qty 2

## 2011-10-07 NOTE — Progress Notes (Signed)
Second attempt to call Mother.    Extensive phone conversation regarding daughters current medical state.  Mother is upset with hospital staff - feels there has been poor communication, concerned regarding nursing care and communication from staff.  Discussed in detail medial issues, reasons impeding weaning and barriers to extubation.  Also, discussed possibility of tracheostomy and reasons for insertion.  Mom is very concerned that she will not understand trach and it will cause more 'harm' in the setting of Downs Syndrome.  She indicates she is considering a request to [facility] but wants to discuss with her family.  She indicated she will tell staff / medical team if she wants to proceed with transfer.  Informed her that with empyema, surgical decortication, ALI/ARDS, current volume status and prolonged critical illness that this will not be a short process for recovery.  She indicates understanding and by end of conversation had calmed.    Will continue to monitor.  Approx phone time 35 minutes.   Canary Brim, NP-C [place] Pulmonary & Critical Care Pgr: [phone] or [phone]

## 2011-10-07 NOTE — Progress Notes (Signed)
Name: [name] MRN: [id number] DOB: 29-Jun-1970    LOS: 13  Referring Provider:  [facility] Reason for Referral:  Postop respiratory failure  PULMONARY / CRITICAL CARE MEDICINE  Brief patient description:  41 year old white female Down's patient with pneumonia and pleural effusion, s/p decortication for empyema, post op resp failure.  Events Since Admission: 7/28  Failed thoracentesis due to loculations, decrtication for empyema, reintubated in PACU 7/29  Extubated 7/30  Reintubated, hypoxia, distress 7/31  Removal chest tube 8/4    Increased oxygen requirements / WBC; ARDS vs edema; ARDS protocol, ABx changes, Lasix trial 8/9    Decreased tolerance to sbt, agitation 8/10  Sedated, comfortable on vent, no acute distress   Current Status:  Sedated, comfortable on vent, no acute distress   Vital Signs: Temp:  [99.2 F (37.3 C)-100.9 F (38.3 C)] 99.6 F (37.6 C) (08/10 0811) Pulse Rate:  [80-142] 88  (08/10 1100) Resp:  [14-39] 18  (08/10 1100) BP: (82-144)/(31-96) 92/43 mmHg (08/10 1100) SpO2:  [78 %-97 %] 94 % (08/10 1100) FiO2 (%):  [39.7 %-71 %] 40.2 % (08/10 1100) Weight:  [238 lb 12.1 oz (108.3 kg)] 238 lb 12.1 oz (108.3 kg) (08/10 0500)  Physical Examination: General:  No distress Neuro: Sedated HEENT: ETT, OG in place, enlarged tongue, rash around neck>>much improved Neck:  Rt IJ site clean Cardiovascular:  s1s2 regular Lungs:  Decreased breath sounds, scattered rhonchi Abdomen:  Soft nontender no organomegaly Musculoskeletal:  ankle edema Skin:  Intact  Dg Chest Port 1 View  10/07/2011  *RADIOLOGY REPORT*  Clinical Data: Respiratory distress.  PORTABLE CHEST - 1 VIEW  Comparison: 10/06/2011  Findings: Endotracheal tube is 2 cm above the carina.  A left PICC line is observed and appears to curve at its tip; this could be in the azygos vein or SVC.  Nasogastric tube enters the stomach.  Stable bilateral airspace opacities noted with cardiomegaly and left pleural  effusion.  IMPRESSION:  1.  PICC line tip is curved at the end, raising the possibility that it might be in the azygos vein rather than the SVC. 2.  Stable bilateral airspace opacities with cardiomegaly and left pleural effusion.  The appearance could reflect pulmonary edema or bilateral pneumonia.  Original Report Authenticated By: Dellia Cloud, M.D.   Dg Chest Port 1 View  10/06/2011  *RADIOLOGY REPORT*  Clinical Data: Evaluate ARDS  PORTABLE CHEST - 1 VIEW  Comparison: 10/04/2011; 10/03/2011; 10/02/2011  Findings: Stable positioning of support apparatus.  There is persistent obscuration of the bilateral heart borders secondary to grossly unchanged extensive heterogeneous air space opacities, right slightly greater than left.  Pleural fluid is again seen layering along the peripheral aspect the left lung about the left lung apex.  A small/trace right-sided effusion is also suspected. No definite pneumothorax.  Unchanged bones.  IMPRESSION: 1.  Stable positioning of support apparatus.  No pneumothorax. 2.  Grossly unchanged extensive bilateral airspace opacities and small left-sided effusion  Original Report Authenticated By: Waynard Reeds, M.D.    ASSESSMENT AND PLAN  PULMONARY  Lab 10/01/11 2152 10/01/11 2023 10/01/11 1444 09/30/11 2311  PHART 7.382 7.392 7.390 7.425  PCO2ART 56.2* 54.0* 53.6* 44.1  PO2ART 78.0* 69.0* 57.0* 72.8*  HCO3 33.0* 32.3* 32.5* 28.4*  O2SAT 94.0 91.0 88.0 94.5   Ventilator Settings:  Vent Mode:  [-] PSV FiO2 (%):  [39.7 %-71 %] 40.2 % Set Rate:  [12 bmp] 12 bmp Vt Set:  [350 mL] 350 mL PEEP:  [  4.9 cmH20-5 cmH20] 4.9 cmH20 Pressure Support:  [10 cmH20] 10 cmH20 Plateau Pressure:  [27 cmH20] 27 cmH20  ETT:   7/28 >>> 7/29 7/30 >>>  A:   Empyema s/p decortication.   Pneumonia.   Acute respiratory failure.  ? ARDS / pulmonary edema given interstitial infiltrates / increased oxygen requirements.  Probable OSA.  P:   Will likely need trach next week  after neck rash improved Titrate PEEP/FiO2 to keep SpO2 > 88%, PaO2 > 60 mmHg Pressure support wean as tolerated F/u CXR  Keep in even to negative fluid balance  CARDIOVASCULAR  Lines: Rt IJ CVL 7/28>>8/05 Lt PICC 8/05>>  A:  Generalized edema>>improved.  P:  -lasix x1 8/10 with kcl  RENAL  Lab 10/07/11 0558 10/06/11 0410 10/05/11 0405 10/04/11 0900 10/04/11 0500 10/01/11 1130  NA 143 141 138 136 113* --  K 3.9 3.9 -- -- -- --  CL 101 98 94* 93* 74* --  CO2 37* 40* 38* 39* 31 --  BUN 23 28* 30* 29* 25* --  CREATININE 0.92 1.02 0.96 0.97 0.82 --  CALCIUM 7.8* 8.1* 8.0* 8.1* 6.5* --  MG -- -- -- -- -- --  PHOS -- -- -- -- -- 3.1   Intake/Output      08/09 0701 - 08/10 0700 08/10 0701 - 08/11 0700   I.V. (mL/kg) 440.9 (4.1) 93.5 (0.9)   NG/GT 865 100   Total Intake(mL/kg) 1305.9 (12.1) 193.5 (1.8)   Urine (mL/kg/hr) 555 (0.2) 50 (0.1)   Stool 100    Total Output 655 50   Net +650.9 +143.5           A:   Hypokalemia.  P:   F/u and replace electrolytes as needed.  GASTROINTESTINAL  Lab 10/05/11 0405 10/03/11 0430 10/02/11 0415 10/01/11 0400  AST 40* 73* 80* 76*  ALT 91* 155* 199* 241*  ALKPHOS 99 129* 157* 175*  BILITOT 0.2* 0.2* 0.3 0.3  PROT 5.5* 5.5* 6.0 5.1*  ALBUMIN 1.4* 1.3* 1.4* 1.3*   Abd u/s 8/05>>non-specific slight echogenic periportal structures ?viral  A:   Nutrition.   Increased LFT>>resolved.  Acute hepatitis panel negative from 8/06.  P:   Continue tube feeds   HEMATOLOGIC  Lab 10/06/11 0410 10/05/11 0405 10/04/11 0500 10/03/11 1800 10/03/11 0430 10/02/11 0415  HGB 8.4* 8.7* 8.9* -- 8.7* 8.9*  HCT 26.5* 27.3* 27.7* -- 27.3* 27.5*  PLT 245 231 222 -- 233 246  INR -- -- -- 1.26 -- --  APTT -- -- -- 34 -- --   A:   Mild anemia, stable.  P:  F/u CBC intermittently Transfuse for Hb < 7  INFECTIOUS  Lab 10/06/11 0410 10/05/11 0405 10/04/11 0500 10/03/11 0430 10/02/11 0415 10/01/11 1130  WBC 11.7* 11.8* 10.9* 13.2* 16.4* --    PROCALCITON -- -- -- -- -- 0.29   Cultures: 7/28  Blood >>Diphtheroids 7/28  Pleural fluid >>negative 7/28 Sputum>>negative 8/04 Sputum>>Candida 8/05 Urine>>negative  Antibiotics: Rocephin (CAP) 7/28 >>> 8/4 Azithromycin  7/28 >>> 8/1 Vanc (Empyema) 7/28 >>> 8/4 Zosyn 8/4 >>>8/07 Cipro 8/4 >>>8/07 Zyvox 8/4 >>>8/07  A:   Community acquired pneumonia with empyema, remains culture negative.  Has persistent fever>>?VAP vs Line.  Fever better after CVL change 8/05.   Candidal rash around neck 8/08>>improved 8/09.  P:   Added nystatin powder 8/08  ENDOCRINE  Lab 10/07/11 0756 10/07/11 0433 10/07/11 0038 10/06/11 1944 10/06/11 1611  GLUCAP 124* 119* 105* 111* 103*   A:  Hyperglycemia.   Hx of hypothyroidism.  P:   SSI/CBG Synthroid  NEUROLOGIC  A:   Mild dementia due to Down syndrome.   Acute encephalopathy due to sedation.   P:   Limit sedation as tolerated Continue buspirone, namenda Resume prozac at 20 daily and increase as needed Daily WUA   BEST PRACTICE / DISPOSITION Level of Care:  ICU Primary Service:  PCCM  Consultants: TCTS Cornelius Moras) Code Status:  Full Diet:  TF DVT Px:  Heparin GI Px:  Protonix Skin Integrity:  Intact Social / Family:  Staff note: NP conversation with mom for 45 minutes on phone: mom very unhappy with care provided. NP will document separately  Canary Brim, NP-C [facility] Pgr: [phone] or [phone]    STAFF NOTE: I, Dr Lavinia Sharps have personally reviewed patient's available data, including medical history, events of note, physical examination and test results as part of my evaluation. I have discussed with resident/NP and other care providers such as pharmacist, RN and RRT.  In addition,  I personally evaluated patient and elicited key findings of acute resp failure. Does not meet sbt criteria.  Rest per NP/medical resident whose note is outlined above and that I agree with  The patient is critically  ill with multiple organ systems failure and requires high complexity decision making for assessment and support, frequent evaluation and titration of therapies, application of advanced monitoring technologies and extensive interpretation of multiple databases.   Critical Care Time devoted to patient care services described in this note is  31  Minutes.  Dr. Kalman Shan, M.D., [facility].C.P Pulmonary and Critical Care Medicine Staff Physician [facility] [place] [facility] Pager: [phone], If no answer or between  15:00h - 7:00h: call [phone]  10/07/2011 2:41 PM

## 2011-10-07 NOTE — Progress Notes (Signed)
Staff note  I d/w NP Ms Veleta Miners abotu this conversation. We will continue to discuss with patient mom. IF [facility] tx needed, we will do it  Dr. Kalman Shan, M.D., [facility].C.P Pulmonary and Critical Care Medicine Staff Physician [facility] [place] [facility] Pager: [phone], If no answer or between  15:00h - 7:00h: call [phone]  10/07/2011 5:16 PM

## 2011-10-08 ENCOUNTER — Inpatient Hospital Stay (HOSPITAL_COMMUNITY): Payer: Medicare Other

## 2011-10-08 LAB — CBC
HCT: 25.3 % — ABNORMAL LOW (ref 36.0–46.0)
Hemoglobin: 7.8 g/dL — ABNORMAL LOW (ref 12.0–15.0)
MCH: 32.9 pg (ref 26.0–34.0)
MCHC: 30.8 g/dL (ref 30.0–36.0)
MCV: 106.8 fL — ABNORMAL HIGH (ref 78.0–100.0)
Platelets: 227 10*3/uL (ref 150–400)
RBC: 2.37 MIL/uL — ABNORMAL LOW (ref 3.87–5.11)
RDW: 15.5 % (ref 11.5–15.5)
WBC: 10.2 10*3/uL (ref 4.0–10.5)

## 2011-10-08 LAB — BASIC METABOLIC PANEL
BUN: 23 mg/dL (ref 6–23)
CO2: 38 mEq/L — ABNORMAL HIGH (ref 19–32)
Calcium: 8.1 mg/dL — ABNORMAL LOW (ref 8.4–10.5)
Chloride: 102 mEq/L (ref 96–112)
Creatinine, Ser: 0.85 mg/dL (ref 0.50–1.10)
GFR calc Af Amer: >90 mL/min (ref >90)
GFR calc non Af Amer: 84 mL/min — ABNORMAL LOW (ref >90)
Glucose, Bld: 124 mg/dL — ABNORMAL HIGH (ref 70–99)
Potassium: 4 mEq/L (ref 3.5–5.1)
Sodium: 143 mEq/L (ref 135–145)

## 2011-10-08 LAB — GLUCOSE, CAPILLARY
Glucose-Capillary: 104 mg/dL — ABNORMAL HIGH (ref 70–99)
Glucose-Capillary: 111 mg/dL — ABNORMAL HIGH (ref 70–99)
Glucose-Capillary: 114 mg/dL — ABNORMAL HIGH (ref 70–99)
Glucose-Capillary: 117 mg/dL — ABNORMAL HIGH (ref 70–99)

## 2011-10-08 LAB — CULTURE, BLOOD (ROUTINE X 2): Culture: NO GROWTH

## 2011-10-08 MED ORDER — POTASSIUM CHLORIDE 20 MEQ/15ML (10%) PO LIQD
20.0000 meq | Freq: Two times a day (BID) | ORAL | Status: AC
  Administered 2011-10-08 (×2): 20 meq
  Filled 2011-10-08 (×2): qty 15

## 2011-10-08 MED ORDER — FUROSEMIDE 10 MG/ML IJ SOLN
20.0000 mg | Freq: Three times a day (TID) | INTRAMUSCULAR | Status: AC
  Administered 2011-10-08 – 2011-10-09 (×3): 20 mg via INTRAVENOUS
  Filled 2011-10-08 (×3): qty 2

## 2011-10-08 NOTE — Progress Notes (Signed)
Ett repositioned

## 2011-10-08 NOTE — Progress Notes (Signed)
Name: [name] MRN: [id number] DOB: [birth year]    LOS: 14  Referring Provider:  [facility] Reason for Referral:  Postop respiratory failure    PULMONARY / CRITICAL CARE MEDICINE   Brief patient description:  41 year old white female Down's patient with pneumonia and pleural effusion, s/p decortication for empyema, post op resp failure.    Events Since Admission: 7/28  Failed thoracentesis due to loculations, decrtication for empyema, reintubated in PACU 7/29  Extubated 7/30  Reintubated, hypoxia, distress 7/31  Removal chest tube 8/4    Increased oxygen requirements / WBC; ARDS vs edema; ARDS protocol, ABx changes, Lasix trial 8/9    Decreased tolerance to sbt, agitation 8/10  Sedated, comfortable on vent, no acute distress 8/11  Trial SBT with Mother at bedside, on low dose fentanyl -  Current Status:  PSV wean 10/5, tolerating well.  No acute events.    Vital Signs: Temp:  [98.5 F (36.9 C)-99.5 F (37.5 C)] 98.5 F (36.9 C) (08/11 0800) Pulse Rate:  [78-93] 83  (08/11 0900) Resp:  [14-39] 15  (08/11 0900) BP: (87-111)/(31-57) 110/57 mmHg (08/11 0900) SpO2:  [89 %-96 %] 96 % (08/11 0900) FiO2 (%):  [39.5 %-71 %] 40 % (08/11 0900)  Physical Examination: General:  No distress Neuro: Sedated HEENT: ETT, OG in place, enlarged tongue, rash around neck>>much improved Neck:  Rt IJ site clean Cardiovascular:  s1s2 regular Lungs:  Decreased breath sounds, scattered rhonchi Abdomen:  Soft nontender no organomegaly Musculoskeletal:  ankle edema Skin:  Intact  Dg Chest Port 1 View  10/08/2011  *RADIOLOGY REPORT*  Clinical Data: Ventilator dependent respiratory failure.  Follow up edema versus pneumonia versus ARDS.  PORTABLE CHEST - 1 VIEW  Comparison: Portable chest x-rays yesterday dating back to 10/01/2011.  Findings: Endotracheal tube tip remains in satisfactory position projecting approximately 3 cm above the carina.  Nasogastric courses below the diaphragm into the  stomach.  Cardiac silhouette enlarged but stable.  Airspace opacities throughout both lungs, unchanged.  No new pulmonary parenchymal abnormalities.  IMPRESSION: Support apparatus satisfactory.  Stable diffuse bilateral edema versus pneumonia versus ARDS.  No new abnormalities.  Original Report Authenticated By: Arnell Sieving, M.D.   Dg Chest Port 1 View  10/07/2011  *RADIOLOGY REPORT*  Clinical Data: Respiratory distress.  PORTABLE CHEST - 1 VIEW  Comparison: 10/06/2011  Findings: Endotracheal tube is 2 cm above the carina.  A left PICC line is observed and appears to curve at its tip; this could be in the azygos vein or SVC.  Nasogastric tube enters the stomach.  Stable bilateral airspace opacities noted with cardiomegaly and left pleural effusion.  IMPRESSION:  1.  PICC line tip is curved at the end, raising the possibility that it might be in the azygos vein rather than the SVC. 2.  Stable bilateral airspace opacities with cardiomegaly and left pleural effusion.  The appearance could reflect pulmonary edema or bilateral pneumonia.  Original Report Authenticated By: Dellia Cloud, M.D.    ASSESSMENT AND PLAN  PULMONARY  Lab 10/01/11 2152 10/01/11 2023 10/01/11 1444  PHART 7.382 7.392 7.390  PCO2ART 56.2* 54.0* 53.6*  PO2ART 78.0* 69.0* 57.0*  HCO3 33.0* 32.3* 32.5*  O2SAT 94.0 91.0 88.0   Ventilator Settings:  Vent Mode:  [-] CPAP;PSV FiO2 (%):  [39.5 %-71 %] 40 % Set Rate:  [12 bmp] 12 bmp Vt Set:  [350 mL] 350 mL PEEP:  [5 cmH20] 5 cmH20 Pressure Support:  [5 cmH20-10 cmH20] 5 cmH20 Plateau Pressure:  [  16 cmH20-25 cmH20] 16 cmH20  ETT:   7/28 >>> 7/29 7/30 >>>  A:   Empyema s/p decortication.   Pneumonia.   Acute respiratory failure.  ? ARDS / pulmonary edema given interstitial infiltrates / increased oxygen requirements.  Probable OSA.  P:   Will likely need trach next week after neck rash improved - would like to diurese and give her a trial at extubation before  committing her to trach (if possible) in the setting of Downs Syndrome.  Mom very concerned about how the patient will handle the trach. Titrate PEEP/FiO2 to keep SpO2 > 88%, PaO2 > 60 mmHg Pressure support wean as tolerated F/u CXR  Keep in even to negative fluid balance  CARDIOVASCULAR  Lines: Rt IJ CVL 7/28>>8/05 Lt PICC 8/05>>  A:  Generalized edema>>improved.  P:  -lasix x1 8/10 with kcl, 8/11  RENAL  Lab 10/08/11 0500 10/07/11 0558 10/06/11 0410 10/05/11 0405 10/04/11 0900 10/01/11 1130  NA 143 143 141 138 136 --  K 4.0 3.9 -- -- -- --  CL 102 101 98 94* 93* --  CO2 38* 37* 40* 38* 39* --  BUN 23 23 28* 30* 29* --  CREATININE 0.85 0.92 1.02 0.96 0.97 --  CALCIUM 8.1* 7.8* 8.1* 8.0* 8.1* --  MG 2.3 -- -- -- -- --  PHOS -- -- -- -- -- 3.1   Intake/Output      08/10 0701 - 08/11 0700 08/11 0701 - 08/12 0700   I.V. (mL/kg) 574.5 (5.3) 20 (0.2)   NG/GT 690 25   Total Intake(mL/kg) 1264.5 (11.7) 45 (0.4)   Urine (mL/kg/hr) 1100 (0.4)    Stool 100    Total Output 1200    Net +64.5 +45         A:   Hypokalemia.  P:   F/u and replace electrolytes as needed.  GASTROINTESTINAL  Lab 10/05/11 0405 10/03/11 0430 10/02/11 0415  AST 40* 73* 80*  ALT 91* 155* 199*  ALKPHOS 99 129* 157*  BILITOT 0.2* 0.2* 0.3  PROT 5.5* 5.5* 6.0  ALBUMIN 1.4* 1.3* 1.4*   Abd u/s 8/05>>non-specific slight echogenic periportal structures ?viral  A:   Nutrition.   Increased LFT>>resolved.  Acute hepatitis panel negative from 8/06.  P:   Continue tube feeds   HEMATOLOGIC  Lab 10/08/11 0500 10/06/11 0410 10/05/11 0405 10/04/11 0500 10/03/11 1800 10/03/11 0430  HGB 7.8* 8.4* 8.7* 8.9* -- 8.7*  HCT 25.3* 26.5* 27.3* 27.7* -- 27.3*  PLT 227 245 231 222 -- 233  INR -- -- -- -- 1.26 --  APTT -- -- -- -- 34 --   A:   Mild anemia, stable.  P:  F/u CBC intermittently Transfuse for Hb < 7  INFECTIOUS  Lab 10/08/11 0500 10/06/11 0410 10/05/11 0405 10/04/11 0500 10/03/11 0430  10/01/11 1130  WBC 10.2 11.7* 11.8* 10.9* 13.2* --  PROCALCITON -- -- -- -- -- 0.29   Cultures: 7/28  Blood >>Diphtheroids 7/28  Pleural fluid >>negative 7/28 Sputum>>negative 8/04 Sputum>>Candida 8/05 Urine>>negative  Antibiotics: Rocephin (CAP) 7/28 >>> 8/4 Azithromycin  7/28 >>> 8/1 Vanc (Empyema) 7/28 >>> 8/4 Zosyn 8/4 >>>8/07 Cipro 8/4 >>>8/07 Zyvox 8/4 >>>8/07  A:   Community acquired pneumonia with empyema, remains culture negative.  Has persistent fever>>?VAP vs Line.  Fever better after CVL change 8/05.   Candidal rash around neck 8/08>>improved 8/09.  P:   Added nystatin powder 8/08  ENDOCRINE  Lab 10/08/11 0359 10/08/11 0023 10/07/11 2008 10/07/11 1549 10/07/11  1159  GLUCAP 104* 113* 123* 132* 110*   A:   Hyperglycemia.   Hx of hypothyroidism.  P:   SSI/CBG Synthroid  NEUROLOGIC  A:   Mild dementia due to Down syndrome.   Acute encephalopathy due to sedation.   P:   Limit sedation as tolerated Continue buspirone, namenda Resume prozac at 20 daily and increase as needed Daily WUA   BEST PRACTICE / DISPOSITION Level of Care:  ICU Primary Service:  PCCM  Consultants: TCTS Cornelius Moras) Code Status:  Full Diet:  TF DVT Px:  Heparin GI Px:  Protonix Skin Integrity:  Intact Social / Family:  \ NP conversation with mom for 45 minutes on phone: mom very unhappy with care provided 10/07/11 Canary Brim, NP-C [place] [facility] Pgr: [phone] or [phone]    10/08/2011 9:13 AM     STAFF NOTE: I, Dr Lavinia Sharps have personally reviewed patient's available data, including medical history, events of note, physical examination and test results as part of my evaluation. I have discussed with resident/NP and other care providers such as pharmacist, RN and RRT.  In addition,  I personally evaluated patient and elicited key findings of acute resp failure due to pneumonia/empyema. More improved mental status today and did PSV well on 10/08/2011.  Mom reluctant for trach and prefers a trial of extubation.  Rest per NP/medical resident whose note is outlined above and that I agree with  The patient is critically ill with multiple organ systems failure and requires high complexity decision making for assessment and support, frequent evaluation and titration of therapies, application of advanced monitoring technologies and extensive interpretation of multiple databases.   Critical Care Time devoted to patient care services described in this note is  31  Minutes.  Dr. Kalman Shan, M.D., [facility].C.P Pulmonary and Critical Care Medicine Staff Physician [facility]  Pulmonary and Critical Care Pager: [phone], If no answer or between  15:00h - 7:00h: call [phone]  10/08/2011 5:03 PM

## 2011-10-09 ENCOUNTER — Inpatient Hospital Stay (HOSPITAL_COMMUNITY): Payer: Medicare Other

## 2011-10-09 LAB — BASIC METABOLIC PANEL
GFR calc Af Amer: >90 mL/min (ref >90)
GFR calc non Af Amer: >90 mL/min (ref >90)
Potassium: 3.5 mEq/L (ref 3.5–5.1)
Sodium: 138 mEq/L (ref 135–145)

## 2011-10-09 LAB — MAGNESIUM: Magnesium: 1.9 mg/dL (ref 1.5–2.5)

## 2011-10-09 LAB — GLUCOSE, CAPILLARY
Glucose-Capillary: 129 mg/dL — ABNORMAL HIGH (ref 70–99)
Glucose-Capillary: 143 mg/dL — ABNORMAL HIGH (ref 70–99)
Glucose-Capillary: 156 mg/dL — ABNORMAL HIGH (ref 70–99)

## 2011-10-09 LAB — CBC
Platelets: 138 10*3/uL — ABNORMAL LOW (ref 150–400)
RBC: 2.26 MIL/uL — ABNORMAL LOW (ref 3.87–5.11)
WBC: 9.8 10*3/uL (ref 4.0–10.5)

## 2011-10-09 MED ORDER — MAGNESIUM SULFATE IN D5W 10-5 MG/ML-% IV SOLN
1.0000 g | INTRAVENOUS | Status: AC
  Administered 2011-10-09: 1 g via INTRAVENOUS
  Filled 2011-10-09: qty 100

## 2011-10-09 MED ORDER — POTASSIUM CHLORIDE 10 MEQ/50ML IV SOLN
10.0000 meq | INTRAVENOUS | Status: AC
  Administered 2011-10-09 (×2): 10 meq via INTRAVENOUS
  Filled 2011-10-09: qty 100

## 2011-10-09 MED ORDER — FUROSEMIDE 10 MG/ML IJ SOLN
40.0000 mg | Freq: Once | INTRAMUSCULAR | Status: AC
  Administered 2011-10-09: 40 mg via INTRAVENOUS
  Filled 2011-10-09: qty 4

## 2011-10-09 MED ORDER — POTASSIUM CHLORIDE 20 MEQ/15ML (10%) PO LIQD
40.0000 meq | Freq: Once | ORAL | Status: AC
  Administered 2011-10-09: 40 meq
  Filled 2011-10-09: qty 30

## 2011-10-09 MED ORDER — DEXMEDETOMIDINE HCL IN NACL 200 MCG/50ML IV SOLN
0.2000 ug/kg/h | INTRAVENOUS | Status: DC
  Administered 2011-10-09: 0.7 ug/kg/h via INTRAVENOUS
  Administered 2011-10-09: 0.4 ug/kg/h via INTRAVENOUS
  Administered 2011-10-09: 0.5 ug/kg/h via INTRAVENOUS
  Administered 2011-10-09 – 2011-10-10 (×3): 0.7 ug/kg/h via INTRAVENOUS
  Administered 2011-10-10 (×2): 0.5 ug/kg/h via INTRAVENOUS
  Filled 2011-10-09 (×12): qty 50

## 2011-10-09 MED ORDER — MAGNESIUM SULFATE 50 % IJ SOLN
1.0000 g | Freq: Once | INTRAVENOUS | Status: DC
  Filled 2011-10-09 (×2): qty 2

## 2011-10-09 NOTE — Progress Notes (Signed)
Patient became very agitated with WUA, thrashing about the bed and attempting to pull at ETT.  Family at bedside, expressed concern over continuous analgesia/sedation being held for WUA.  RR >50, HR >140, O2 saturation <60%.   Attempts to calm patient through nonpharmacalogical means unsuccessful.  MD notified, continuous sedation resumed.

## 2011-10-09 NOTE — Progress Notes (Signed)
[facility] ADULT ICU REPLACEMENT PROTOCOL FOR AM LAB REPLACEMENT ONLY  The patient does apply for the [facility] Adult ICU Electrolyte Replacment Protocol based on the criteria listed below:   1. Is GFR >/= 50 ml/min? yes  Patient's GFR today is >90 2. Is urine output >/= 0.5 ml/kg/hr for the last 8 hours? yes Patient's UOP is .9 ml/kg/hr 3. Is BUN < 30 mg/dL? yes  Patient's BUN today is 21 4. Abnormal electrolyte(s)Potassium 3.5 5. Ordered repletion with:Potassium per protocol 6. If a panic level lab has been reported, has the CCM MD in charge been notified? yes.   Physician:  Dr. Arvil Persons, Torianna Junio P 10/09/2011 5:33 AM

## 2011-10-09 NOTE — Progress Notes (Signed)
Patient with elevated RR 50, suctioned with mild occluded ETT, sp02 dropped to 67%, Patient was bagged and lavaged, fio2 increased to 50%, sp02 now 94%. RN notified with patient being placed back to full support.

## 2011-10-09 NOTE — Progress Notes (Signed)
Name: [name] MRN: [id number] DOB: Jul 18, 1970    LOS: 15  Referring Provider:  [facility] Reason for Referral:  Postop respiratory failure    PULMONARY / CRITICAL CARE MEDICINE   Brief patient description:  41 year old white female Down's patient with pneumonia and pleural effusion, s/p decortication for empyema, post op resp failure.    DEVICES ETT:  7/28 >>> 7/29, 7/30 >>> Lines: Rt IJ CVL 7/28>>8/05, Lt PICC 8/05>>     Events Since Admission: 7/28  Failed thoracentesis due to loculations, decrtication for empyema, reintubated in PACU 7/29  Extubated 7/30  Reintubated, hypoxia, distress 7/31  Removal chest tube 8/4    Increased oxygen requirements / WBC; ARDS vs edema; ARDS protocol, ABx changes, Lasix trial 8/9    Decreased tolerance to sbt, agitation 8/10  Sedated, comfortable on vent, no acute distress 8/11  Trial SBT with Mother at bedside, on low dose fentanyl -   SUBJECTIVE/OVERNIGHT/INTERVAL HX Did well on SBT but with fent gtt on board. OFf fent gtt -> RASS +4 and vent dysnchrony. Also, cxr worse with pulmonary edema chf pattern  Mom keen on trial of extubation    Vital Signs: Temp:  [98.4 F (36.9 C)-99.8 F (37.7 C)] 98.4 F (36.9 C) (08/12 0826) Pulse Rate:  [74-113] 105  (08/12 0825) Resp:  [12-24] 24  (08/12 0825) BP: (90-111)/(45-63) 105/56 mmHg (08/12 0825) SpO2:  [89 %-97 %] 93 % (08/12 0825) FiO2 (%):  [39.6 %-49.3 %] 39.9 % (08/12 0825) Weight:  [106.6 kg (235 lb 0.2 oz)] 106.6 kg (235 lb 0.2 oz) (08/12 0455)  Physical Examination: General:  No distress Neuro: RASS 0 on fent gtt and following commands. RASS +4 off sedation gtt HEENT: ETT, OG in place, enlarged tongue, rash around neck>>much improved Neck:  Rt IJ site clean Cardiovascular:  s1s2 regular Lungs:  Decreased breath sounds, scattered rhonchi Abdomen:  Soft nontender no organomegaly Musculoskeletal:  ankle edema Skin:  Intact  Dg Chest Port 1 View  10/09/2011  *RADIOLOGY  REPORT*  Clinical Data: Evaluate endotracheal tube position.  Bronchitis. Down syndrome.  Pneumonia.  Empyema of left pleural space.  PORTABLE CHEST - 1 VIEW  Comparison: 1 day prior  Findings: Endotracheal tube unchanged in position, 3.0 cm above carina.  Left-sided PICC line has been repositioned/retracted. Likely has its tip entering the azygos vein.  A nasogastric tube extends beyond the  inferior aspect of the film.  Heart is likely mildly enlarged. Left-sided pleural effusion or thickening laterally.  Cannot exclude layering right pleural effusion.  No pneumothorax.  Worsening bilateral airspace disease/aeration.  IMPRESSION:  1.  Slight worsening aeration with increased bilateral airspace disease. 2.  Repositioning/retraction of left-sided PICC line.  This likely enters the azygos vein. These results will be called to the ordering clinician or representative by the Radiologist Assistant, and communication documented in the PACS Dashboard. 3.  Probable bilateral pleural effusions.  Cannot exclude minimal loculation of left-sided pleural fluid.  Original Report Authenticated By: Consuello Bossier, M.D.   Dg Chest Port 1 View  10/08/2011  *RADIOLOGY REPORT*  Clinical Data: Ventilator dependent respiratory failure.  Follow up edema versus pneumonia versus ARDS.  PORTABLE CHEST - 1 VIEW  Comparison: Portable chest x-rays yesterday dating back to 10/01/2011.  Findings: Endotracheal tube tip remains in satisfactory position projecting approximately 3 cm above the carina.  Nasogastric courses below the diaphragm into the stomach.  Cardiac silhouette enlarged but stable.  Airspace opacities throughout both lungs, unchanged.  No new pulmonary  parenchymal abnormalities.  IMPRESSION: Support apparatus satisfactory.  Stable diffuse bilateral edema versus pneumonia versus ARDS.  No new abnormalities.  Original Report Authenticated By: Arnell Sieving, M.D.    ASSESSMENT AND PLAN  PULMONARY No results found for  this basename: PHART:5,PCO2:5,PCO2ART:5,PO2ART:5,HCO3:5,O2SAT:5 in the last 168 hours Ventilator Settings:  Vent Mode:  [-] CPAP FiO2 (%):  [39.6 %-49.3 %] 39.9 % Set Rate:  [12 bmp] 12 bmp Vt Set:  [350 mL] 350 mL PEEP:  [4.9 cmH20-5 cmH20] 5 cmH20 Pressure Support:  [5 cmH20-10 cmH20] 5 cmH20 Plateau Pressure:  [14 cmH20-25 cmH20] 25 cmH20   A:   Empyema s/p decortication.   Pneumonia.   Acute respiratory failure.  ? ARDS / pulmonary edema given interstitial infiltrates / increased oxygen requirements.  Probable OSA. Mom worried about trach   -on 10/09/11: Did well on SBT on sedation but off sedation RASS +4 and also CXR worsening edema, ? cHF. Mom wants trial of extubation  Prior to trach  P:   Diurese Full vent support No extubation 10/09/11   CARDIOVASCULAR   A:  On 10/09/11: concern for CHF on CXR  P:  - diurese - check echo  RENAL  Lab 10/09/11 0413 10/08/11 0500 10/07/11 0558 10/06/11 0410 10/05/11 0405  NA 138 143 143 141 138  K 3.5 4.0 -- -- --  CL 100 102 101 98 94*  CO2 33* 38* 37* 40* 38*  BUN 21 23 23  28* 30*  CREATININE 0.77 0.85 0.92 1.02 0.96  CALCIUM 7.4* 8.1* 7.8* 8.1* 8.0*  MG 1.9 2.3 -- -- --  PHOS -- -- -- -- --   Intake/Output      08/11 0701 - 08/12 0700 08/12 0701 - 08/13 0700   I.V. (mL/kg) 564.4 (5.3) 25 (0.2)   Other 20    NG/GT 380 25   IV Piggyback 100    Total Intake(mL/kg) 1064.4 (10) 50 (0.5)   Urine (mL/kg/hr) 2140 (0.8) 100   Stool 150    Total Output 2290 100   Net -1225.7 -50         A:   Hypokalemia and mild hypomag.  P:   Supplement mag  GASTROINTESTINAL  Lab 10/05/11 0405 10/03/11 0430  AST 40* 73*  ALT 91* 155*  ALKPHOS 99 129*  BILITOT 0.2* 0.2*  PROT 5.5* 5.5*  ALBUMIN 1.4* 1.3*   Abd u/s 8/05>>non-specific slight echogenic periportal structures.   Acute hepatitis panel negative from 8/06.  A: Nil acute on 8/12 (Increased LFT>>resolved).  P:   Continue tube feeds   HEMATOLOGIC  Lab 10/09/11  0413 10/08/11 0500 10/06/11 0410 10/05/11 0405 10/04/11 0500 10/03/11 1800  HGB 7.5* 7.8* 8.4* 8.7* 8.9* --  HCT 24.1* 25.3* 26.5* 27.3* 27.7* --  PLT 138* 227 245 231 222 --  INR -- -- -- -- -- 1.26  APTT -- -- -- -- -- 34   A:   Mild anemia, stable.  P:  F/u CBC intermittently Transfuse for Hb < 7  INFECTIOUS  Lab 10/09/11 0413 10/08/11 0500 10/06/11 0410 10/05/11 0405 10/04/11 0500  WBC 9.8 10.2 11.7* 11.8* 10.9*  PROCALCITON -- -- -- -- --   Cultures: 7/28  Blood >>Diphtheroids 7/28  Pleural fluid >>negative 7/28 Sputum>>negative 8/04 Sputum>>Candida 8/05 Urine>>negative  Antibiotics: Rocephin (CAP) 7/28 >>> 8/4 Azithromycin  7/28 >>> 8/1 Vanc (Empyema) 7/28 >>> 8/4 Zosyn 8/4 >>>8/07 Cipro 8/4 >>>8/07 Zyvox 8/4 >>>8/07  A:   Community acquired pneumonia with empyema, remains culture negative.  Has persistent fever>>?VAP vs Line.  Fever better after CVL change 8/05.   Candidal rash around neck 8/08>>improved 8/09.  P:   Added nystatin powder 8/08  ENDOCRINE  Lab 10/09/11 0821 10/09/11 0405 10/09/11 0012 10/08/11 2005 10/08/11 1525  GLUCAP 110* 102* 113* 139* 117*   A:   Hyperglycemia.   Hx of hypothyroidism.  P:   SSI/CBG Synthroid  NEUROLOGIC  A:   Mild dementia due to Down syndrome.   Acute encephalopathy due to sedation.   P:   Limit sedation as tolerated Continue buspirone, namenda Resume prozac at 20 daily and increase as needed Daily WUA   BEST PRACTICE / DISPOSITION Level of Care:  ICU Primary Service:  PCCM  Consultants: TCTS Cornelius Moras) Code Status:  Full Diet:  TF DVT Px:  Heparin GI Px:  Protonix Skin Integrity:  Intact Social / Family:  \ NP conversation with mom for 45 minutes on phone: mom very unhappy with care provided 10/07/11     The patient is critically ill with multiple organ systems failure and requires high complexity decision making for assessment and support, frequent evaluation and titration of therapies,  application of advanced monitoring technologies and extensive interpretation of multiple databases.   Critical Care Time devoted to patient care services described in this note is  31  Minutes.  Dr. Kalman Shan, M.D., [facility].C.P Pulmonary and Critical Care Medicine Staff Physician [facility] [place] [facility] Pager: [phone], If no answer or between  15:00h - 7:00h: call [phone]  10/09/2011 8:58 AM

## 2011-10-09 NOTE — Progress Notes (Signed)
  Echocardiogram 2D Echocardiogram with Definity has been performed.  [name] 10/09/2011, 3:10 PM

## 2011-10-09 NOTE — Progress Notes (Signed)
10/08/11 2030  Pt becomes extremely agitated, given Fentanyl bolus of . Pt HR ST in 140s-150s, and O2Sats dropping in low 80s and 70s from pt biting tube. Increased pt FiO2 to 50%. Pt given additional Fentanyl bolus of . Pt still extremely agitated. Pt versed gtt started again. Pt beginning to relax. Pt vital signs return to WNL. Pt FiO2 decreased back to 40%.   Pt mother called and is informed of the recent changes. Pt mother explains that she okay and pleased with interventions and hopes to be at bedside around 0700-0730 for hopes of being able to wean off the vent in the AM.   Will continue to monitor pt.   Elisha Headland RN

## 2011-10-09 NOTE — Progress Notes (Signed)
Nutrition Follow-up  Intervention:  Continue current enteral nutrition regimen -- Oxepa at 25 ml/h with Prostat 30 ml QID to meet nutrition goal.  Assessment:   Patient remains intubated.  TF was held this morning for possible extubation; has been resumed since patient unable to be extubated this morning.  Diet Order:  Oxepa at 25 ml/hr via OGT with Prostat 30 ml 4 times daily, which provides 1300 kcal, 98 g protein, and 471 ml free water. This meets 68% estimated calorie needs and 100% estimated protein needs. She is tolerating well. She is also receiving liquid MVI daily.   Meds: Scheduled Meds:    . antiseptic oral rinse  15 mL Mouth Rinse QID  . busPIRone  15 mg Oral BID  . chlorhexidine  15 mL Mouth Rinse BID  . feeding supplement (OXEPA)  1,000 mL Per Tube Q24H  . feeding supplement  30 mL Per Tube QID  . FLUoxetine  20 mg Per Tube Daily  . furosemide  20 mg Intravenous Q8H  . furosemide  40 mg Intravenous Once  . heparin subcutaneous  5,000 Units Subcutaneous Q8H  . insulin aspart  0-24 Units Subcutaneous Q4H  . levothyroxine  112 mcg Oral QAC breakfast  . lidocaine  1 patch Transdermal Q24H  . memantine  10 mg Oral BID  . multivitamin  5 mL Per Tube Daily  . nystatin   Topical BID  . pantoprazole sodium  40 mg Per Tube Q1200  . potassium chloride  10 mEq Intravenous Q1 Hr x 2  . potassium chloride  20 mEq Per Tube BID  . potassium chloride  40 mEq Per Tube Once  . sodium chloride  10-40 mL Intracatheter Q12H   Continuous Infusions:    . sodium chloride 20 mL/hr at 10/06/11 0152  . dexmedetomidine 0.7 mcg/kg/hr (10/09/11 1342)  . fentaNYL infusion INTRAVENOUS 50 mcg/hr (10/09/11 1343)  . midazolam (VERSED) infusion 1 mg/hr (10/09/11 1052)   PRN Meds:.acetaminophen (TYLENOL) oral liquid 160 mg/5 mL, bisacodyl, fentaNYL, ipratropium, midazolam, sodium chloride  Labs:  CMP     Component Value Date/Time   NA 138 10/09/2011 0413   K 3.5 10/09/2011 0413   CL 100  10/09/2011 0413   CO2 33* 10/09/2011 0413   GLUCOSE 110* 10/09/2011 0413   BUN 21 10/09/2011 0413   CREATININE 0.77 10/09/2011 0413   CALCIUM 7.4* 10/09/2011 0413   PROT 5.5* 10/05/2011 0405   ALBUMIN 1.4* 10/05/2011 0405   AST 40* 10/05/2011 0405   ALT 91* 10/05/2011 0405   ALKPHOS 99 10/05/2011 0405   BILITOT 0.2* 10/05/2011 0405   GFRNONAA >90 10/09/2011 0413   GFRAA >90 10/09/2011 0413   CBG (last 3)   Basename 10/09/11 1139 10/09/11 0821 10/09/11 0405  GLUCAP 129* 110* 102*    Potassium  Date/Time Value Range Status  10/09/2011  4:13 AM 3.5  3.5 - 5.1 mEq/L Final  10/08/2011  5:00 AM 4.0  3.5 - 5.1 mEq/L Final  10/07/2011  5:58 AM 3.9  3.5 - 5.1 mEq/L Final    Phosphorus  Date/Time Value Range Status  10/01/2011 11:30 AM 3.1  2.3 - 4.6 mg/dL Final  4/0/1027  2:53 AM 1.5* 2.3 - 4.6 mg/dL Final  08/02/4401  4:74 AM 2.5  2.3 - 4.6 mg/dL Final    Magnesium  Date/Time Value Range Status  10/09/2011  4:13 AM 1.9  1.5 - 2.5 mg/dL Final  2/59/5638  7:56 AM 2.3  1.5 - 2.5 mg/dL Final  05/30/3293  4:32 AM 1.9  1.5 - 2.5 mg/dL Final      Intake/Output Summary (Last 24 hours) at 10/09/11 1406 Last data filed at 10/09/11 1200  Gross per 24 hour  Intake  948.5 ml  Output   2375 ml  Net -1426.5 ml    Weight Status:  106.6 kg, down from 108.6 kg on 8/6 and 112.8 kg on 8/1  BMI=43  Re-estimated needs:   1950 kcal 90-100 g protein  Nutrition Dx:  Inadequate oral intake, ongoing  Goal:  Enteral nutrition to provide 60-70% of estimated calorie needs (22-25 kcals/kg ideal body weight) and >/= 90% of estimated protein needs, based on ASPEN guidelines for permissive underfeeding in critically ill obese individuals, met.  Monitor:  TF tolerance/adequacy, weight trend, labs, I/O.   Joaquin Courts, RD, CNSC, LDN Pager# [phone] After Hours Pager# [phone]

## 2011-10-10 ENCOUNTER — Inpatient Hospital Stay (HOSPITAL_COMMUNITY): Payer: Medicare Other

## 2011-10-10 LAB — BASIC METABOLIC PANEL
Chloride: 109 mEq/L (ref 96–112)
GFR calc Af Amer: >90 mL/min (ref >90)
Potassium: 3.9 mEq/L (ref 3.5–5.1)

## 2011-10-10 LAB — GLUCOSE, CAPILLARY
Glucose-Capillary: 119 mg/dL — ABNORMAL HIGH (ref 70–99)
Glucose-Capillary: 123 mg/dL — ABNORMAL HIGH (ref 70–99)
Glucose-Capillary: 125 mg/dL — ABNORMAL HIGH (ref 70–99)
Glucose-Capillary: 129 mg/dL — ABNORMAL HIGH (ref 70–99)

## 2011-10-10 NOTE — Progress Notes (Signed)
Pt placed back on full support due to increased agitation and increased WOB

## 2011-10-10 NOTE — Consult Note (Signed)
Reason for Consult: Tracheostomy Referring Physician: CCM  [name] is an 41 y.o. female.  HPI: Prolonged intubation  Past Medical History  Diagnosis Date  . Bronchitis   . Down syndrome   . Pneumonia     multpile times  . Dementia   . Empyema of left pleural space 09/24/2011    Past Surgical History  Procedure Date  . Back surgery   . Anterior cruciate ligament repair   . Flexible bronchoscopy 09/24/2011    Procedure: FLEXIBLE BRONCHOSCOPY;  Surgeon: Purcell Nails, MD;  Location: [facility] OR;  Service: Thoracic;  Laterality: N/A;    History reviewed. No pertinent family history.  Social History:  reports that she has never smoked. She does not have any smokeless tobacco history on file. She reports that she does not drink alcohol or use illicit drugs.  Allergies: No Known Allergies  Medications: I have reviewed the patient's current medications.  Results for orders placed during the hospital encounter of 09/24/11 (from the past 48 hour(s))  GLUCOSE, CAPILLARY     Status: Abnormal   Collection Time   10/08/11  8:05 PM      Component Value Range Comment   Glucose-Capillary 139 (*) 70 - 99 mg/dL   GLUCOSE, CAPILLARY     Status: Abnormal   Collection Time   10/09/11 12:12 AM      Component Value Range Comment   Glucose-Capillary 113 (*) 70 - 99 mg/dL   GLUCOSE, CAPILLARY     Status: Abnormal   Collection Time   10/09/11  4:05 AM      Component Value Range Comment   Glucose-Capillary 102 (*) 70 - 99 mg/dL   BASIC METABOLIC PANEL     Status: Abnormal   Collection Time   10/09/11  4:13 AM      Component Value Range Comment   Sodium 138  135 - 145 mEq/L    Potassium 3.5  3.5 - 5.1 mEq/L    Chloride 100  96 - 112 mEq/L    CO2 33 (*) 19 - 32 mEq/L    Glucose, Bld 110 (*) 70 - 99 mg/dL    BUN 21  6 - 23 mg/dL    Creatinine, Ser 9.60  0.50 - 1.10 mg/dL    Calcium 7.4 (*) 8.4 - 10.5 mg/dL    GFR calc non Af Amer >90  >90 mL/min    GFR calc Af Amer >90  >90 mL/min     MAGNESIUM     Status: Normal   Collection Time   10/09/11  4:13 AM      Component Value Range Comment   Magnesium 1.9  1.5 - 2.5 mg/dL   CBC     Status: Abnormal   Collection Time   10/09/11  4:13 AM      Component Value Range Comment   WBC 9.8  4.0 - 10.5 K/uL    RBC 2.26 (*) 3.87 - 5.11 MIL/uL    Hemoglobin 7.5 (*) 12.0 - 15.0 g/dL    HCT 45.4 (*) 09.8 - 46.0 %    MCV 106.6 (*) 78.0 - 100.0 fL    MCH 33.2  26.0 - 34.0 pg    MCHC 31.1  30.0 - 36.0 g/dL    RDW 11.9 (*) 14.7 - 15.5 %    Platelets 138 (*) 150 - 400 K/uL DELTA CHECK NOTED  GLUCOSE, CAPILLARY     Status: Abnormal   Collection Time   10/09/11  8:21 AM  Component Value Range Comment   Glucose-Capillary 110 (*) 70 - 99 mg/dL   GLUCOSE, CAPILLARY     Status: Abnormal   Collection Time   10/09/11 11:39 AM      Component Value Range Comment   Glucose-Capillary 129 (*) 70 - 99 mg/dL   GLUCOSE, CAPILLARY     Status: Abnormal   Collection Time   10/09/11  4:05 PM      Component Value Range Comment   Glucose-Capillary 143 (*) 70 - 99 mg/dL   GLUCOSE, CAPILLARY     Status: Abnormal   Collection Time   10/09/11  8:05 PM      Component Value Range Comment   Glucose-Capillary 156 (*) 70 - 99 mg/dL    Comment 1 Notify RN      Comment 2 Documented in Chart     GLUCOSE, CAPILLARY     Status: Abnormal   Collection Time   10/10/11 12:13 AM      Component Value Range Comment   Glucose-Capillary 119 (*) 70 - 99 mg/dL   GLUCOSE, CAPILLARY     Status: Abnormal   Collection Time   10/10/11  4:09 AM      Component Value Range Comment   Glucose-Capillary 129 (*) 70 - 99 mg/dL    Comment 1 Documented in Chart      Comment 2 Notify RN     BASIC METABOLIC PANEL     Status: Abnormal   Collection Time   10/10/11  5:03 AM      Component Value Range Comment   Sodium 144  135 - 145 mEq/L    Potassium 3.9  3.5 - 5.1 mEq/L    Chloride 109  96 - 112 mEq/L    CO2 30  19 - 32 mEq/L    Glucose, Bld 117 (*) 70 - 99 mg/dL    BUN 27 (*) 6  - 23 mg/dL    Creatinine, Ser 1.19  0.50 - 1.10 mg/dL    Calcium 6.3 (*) 8.4 - 10.5 mg/dL    GFR calc non Af Amer 89 (*) >90 mL/min    GFR calc Af Amer >90  >90 mL/min   PRO B NATRIURETIC PEPTIDE     Status: Abnormal   Collection Time   10/10/11  5:03 AM      Component Value Range Comment   Pro B Natriuretic peptide (BNP) 355.1 (*) 0 - 125 pg/mL   GLUCOSE, CAPILLARY     Status: Abnormal   Collection Time   10/10/11  7:21 AM      Component Value Range Comment   Glucose-Capillary 123 (*) 70 - 99 mg/dL   GLUCOSE, CAPILLARY     Status: Abnormal   Collection Time   10/10/11 11:47 AM      Component Value Range Comment   Glucose-Capillary 136 (*) 70 - 99 mg/dL   GLUCOSE, CAPILLARY     Status: Abnormal   Collection Time   10/10/11  3:41 PM      Component Value Range Comment   Glucose-Capillary 141 (*) 70 - 99 mg/dL     Dg Chest Port 1 View  10/10/2011  *RADIOLOGY REPORT*  Clinical Data: ARDS.  Shortness of breath.  PORTABLE CHEST - 1 VIEW  Comparison: 10/09/2011.  Findings: Endotracheal tube, central venous catheter, nasogastric tube all in good position. Left arm PICC line has been repositioned and now lies with its tip near the SVC/RA junction.  Cardiomegaly. Bilateral airspace disease consistent with ARDS  remains stable. Left pleural effusion.  Cannot exclude layering right pleural effusion.  No visible pneumothorax.  IMPRESSION: Stable bilateral airspace disease consistent with ARDS.  Successful repositioning left PICC line.  Original Report Authenticated By: Elsie Stain, M.D.   Dg Chest Port 1 View  10/09/2011  *RADIOLOGY REPORT*  Clinical Data: Evaluate endotracheal tube position.  Bronchitis. Down syndrome.  Pneumonia.  Empyema of left pleural space.  PORTABLE CHEST - 1 VIEW  Comparison: 1 day prior  Findings: Endotracheal tube unchanged in position, 3.0 cm above carina.  Left-sided PICC line has been repositioned/retracted. Likely has its tip entering the azygos vein.  A nasogastric  tube extends beyond the  inferior aspect of the film.  Heart is likely mildly enlarged. Left-sided pleural effusion or thickening laterally.  Cannot exclude layering right pleural effusion.  No pneumothorax.  Worsening bilateral airspace disease/aeration.  IMPRESSION:  1.  Slight worsening aeration with increased bilateral airspace disease. 2.  Repositioning/retraction of left-sided PICC line.  This likely enters the azygos vein. These results will be called to the ordering clinician or representative by the Radiologist Assistant, and communication documented in the PACS Dashboard. 3.  Probable bilateral pleural effusions.  Cannot exclude minimal loculation of left-sided pleural fluid.  Original Report Authenticated By: Consuello Bossier, M.D.     Blood pressure 98/47, pulse 85, temperature 102.4 F (39.1 C), temperature source Oral, resp. rate 20, height 5\' 2"  (1.575 m), weight 240 lb 1.3 oz (108.9 kg), last menstrual period 09/18/2011, SpO2 91.00%.  PHYSICAL EXAM: Overall appearance:  Intubated and sedated. Head:  Down's facies, atraumatic. Oral Cavity:  ETT in place Neck: No palpable neck masses.  Studies Reviewed:none  Assessment/Plan: Agree with need for tracheostomy. Will schedule for tomorrow afternoon. Discussed with mother over the phone, all questions answered.  Gerhard Rappaport 10/10/2011, 5:23 PM

## 2011-10-10 NOTE — Progress Notes (Signed)
eLink Physician-Brief Progress Note Patient Name: [name] DOB: September 02, 1970 MRN: [id number]  Date of Service  10/10/2011   HPI/Events of Note     eICU Interventions  Dc precedex - hypotension   Intervention Category Intermediate Interventions: Hypotension - evaluation and management  Bud Kaeser V. 10/10/2011, 8:40 PM

## 2011-10-10 NOTE — Progress Notes (Signed)
Name: [name] MRN: [id number] DOB: 1970/06/27    LOS: 16  Referring Provider:  [facility] Reason for Referral:  Postop respiratory failure    PULMONARY / CRITICAL CARE MEDICINE   Brief patient description:  41 year old white female Down's patient with pneumonia and pleural effusion, s/p decortication for empyema, post op resp failure.    DEVICES ETT:  7/28 >>> 7/29, 7/30 >>> Lines: Rt IJ CVL 7/28>>8/05, Lt PICC 8/05>>     Events Since Admission: 7/28  Failed thoracentesis due to loculations, decrtication for empyema, reintubated in PACU 7/29  Extubated 7/30  Reintubated, hypoxia, distress 7/31  Removal chest tube 8/4    Increased oxygen requirements / WBC; ARDS vs edema; ARDS protocol, ABx changes, Lasix trial 8/9    Decreased tolerance to sbt, agitation 8/10  Sedated, comfortable on vent, no acute distress 8/11  Trial SBT with Mother at bedside, on low dose fentanyl -   SUBJECTIVE/OVERNIGHT/INTERVAL HX 8/12 and 8/13: Did well on SBT but with fent gtt on board. OFf fent gtt -> RASS +4 and vent dysnchrony. Also, cxr worse with pulmonary edema chf pattern  Mom now very upst that patient not extubated yesterday. Met with Dr Herma Carson on call yesterday, and now in favor of trach.    Vital Signs: Temp:  [99.8 F (37.7 C)-102.5 F (39.2 C)] 102.4 F (39.1 C) (08/13 1153) Pulse Rate:  [44-94] 85  (08/13 1637) Resp:  [17-44] 20  (08/13 1637) BP: (77-121)/(28-58) 98/47 mmHg (08/13 1637) SpO2:  [90 %-100 %] 91 % (08/13 1637) FiO2 (%):  [40 %-100 %] 40 % (08/13 1637) Weight:  [108.9 kg (240 lb 1.3 oz)] 108.9 kg (240 lb 1.3 oz) (08/13 0500)  Physical Examination: General:  No distress Neuro: RASS 0 on fent gtt and following commands. RASS +4 off sedation gtt HEENT: ETT, OG in place, enlarged tongue, rash around neck>>much improved Neck:  Rt IJ site clean Cardiovascular:  s1s2 regular Lungs:  Decreased breath sounds, scattered rhonchi Abdomen:  Soft nontender no  organomegaly Musculoskeletal:  ankle edema Skin:  Intact  Dg Chest Port 1 View  10/10/2011  *RADIOLOGY REPORT*  Clinical Data: ARDS.  Shortness of breath.  PORTABLE CHEST - 1 VIEW  Comparison: 10/09/2011.  Findings: Endotracheal tube, central venous catheter, nasogastric tube all in good position. Left arm PICC line has been repositioned and now lies with its tip near the SVC/RA junction.  Cardiomegaly. Bilateral airspace disease consistent with ARDS remains stable. Left pleural effusion.  Cannot exclude layering right pleural effusion.  No visible pneumothorax.  IMPRESSION: Stable bilateral airspace disease consistent with ARDS.  Successful repositioning left PICC line.  Original Report Authenticated By: Elsie Stain, M.D.   Dg Chest Port 1 View  10/09/2011  *RADIOLOGY REPORT*  Clinical Data: Evaluate endotracheal tube position.  Bronchitis. Down syndrome.  Pneumonia.  Empyema of left pleural space.  PORTABLE CHEST - 1 VIEW  Comparison: 1 day prior  Findings: Endotracheal tube unchanged in position, 3.0 cm above carina.  Left-sided PICC line has been repositioned/retracted. Likely has its tip entering the azygos vein.  A nasogastric tube extends beyond the  inferior aspect of the film.  Heart is likely mildly enlarged. Left-sided pleural effusion or thickening laterally.  Cannot exclude layering right pleural effusion.  No pneumothorax.  Worsening bilateral airspace disease/aeration.  IMPRESSION:  1.  Slight worsening aeration with increased bilateral airspace disease. 2.  Repositioning/retraction of left-sided PICC line.  This likely enters the azygos vein. These results will be called  to the ordering clinician or representative by the Radiologist Assistant, and communication documented in the PACS Dashboard. 3.  Probable bilateral pleural effusions.  Cannot exclude minimal loculation of left-sided pleural fluid.  Original Report Authenticated By: Consuello Bossier, M.D.    ASSESSMENT AND  PLAN  PULMONARY No results found for this basename: PHART:5,PCO2:5,PCO2ART:5,PO2ART:5,HCO3:5,O2SAT:5 in the last 168 hours Ventilator Settings:  Vent Mode:  [-] PSV FiO2 (%):  [40 %-100 %] 40 % Set Rate:  [12 bmp] 12 bmp Vt Set:  [350 mL] 350 mL PEEP:  [5 cmH20] 5 cmH20 Pressure Support:  [8 cmH20-12 cmH20] 12 cmH20 Plateau Pressure:  [19 cmH20-25 cmH20] 20 cmH20   A:   Empyema s/p decortication.   Pneumonia.   Acute respiratory failure.  ? ARDS / pulmonary edema given interstitial infiltrates / increased oxygen requirements.  Probable OSA. Mom worried about trach   -on 10/10/11: Not meeting SBT criteria due to diffuse air space disease and severe agitation though other parameters ok    P:   Full vent support No extubation 10/10/11 D/w Mom, sister Vickie and dad - all in favor of trach. DR Z coordinating and calling ENT for trach  CARDIOVASCULAR  A:  On 10/09/11: concern for CHF on CXR On 10/10/11: echo normal.   P:  - diurese as tolerated   RENAL  Lab 10/10/11 0503 10/09/11 0413 10/08/11 0500 10/07/11 0558 10/06/11 0410  NA 144 138 143 143 141  K 3.9 3.5 -- -- --  CL 109 100 102 101 98  CO2 30 33* 38* 37* 40*  BUN 27* 21 23 23  28*  CREATININE 0.81 0.77 0.85 0.92 1.02  CALCIUM 6.3* 7.4* 8.1* 7.8* 8.1*  MG -- 1.9 2.3 -- --  PHOS -- -- -- -- --   Intake/Output      08/12 0701 - 08/13 0700 08/13 0701 - 08/14 0700   I.V. (mL/kg) 999.7 (9.2) 283.2 (2.6)   Other     NG/GT 100    IV Piggyback     Total Intake(mL/kg) 1099.7 (10.1) 283.2 (2.6)   Urine (mL/kg/hr) 1365 (0.5) 233 (0.2)   Stool 175    Total Output 1540 233   Net -440.4 +50.2         A:   normal  P:   monitor  GASTROINTESTINAL  Lab 10/05/11 0405  AST 40*  ALT 91*  ALKPHOS 99  BILITOT 0.2*  PROT 5.5*  ALBUMIN 1.4*   Abd u/s 8/05>>non-specific slight echogenic periportal structures.   Acute hepatitis panel negative from 8/06.  A: Nil acute on 8/12 (Increased LFT>>resolved).  P:   Continue  tube feeds   HEMATOLOGIC  Lab 10/09/11 0413 10/08/11 0500 10/06/11 0410 10/05/11 0405 10/04/11 0500 10/03/11 1800  HGB 7.5* 7.8* 8.4* 8.7* 8.9* --  HCT 24.1* 25.3* 26.5* 27.3* 27.7* --  PLT 138* 227 245 231 222 --  INR -- -- -- -- -- 1.26  APTT -- -- -- -- -- 34   A:   Mild anemia, stable.  P:  F/u CBC intermittently Transfuse for Hb < 7  INFECTIOUS  Lab 10/09/11 0413 10/08/11 0500 10/06/11 0410 10/05/11 0405 10/04/11 0500  WBC 9.8 10.2 11.7* 11.8* 10.9*  PROCALCITON -- -- -- -- --   Cultures: 7/28  Blood >>Diphtheroids 7/28  Pleural fluid >>negative 7/28 Sputum>>negative 8/04 Sputum>>Candida 8/05 Urine>>negative  Antibiotics: Rocephin (CAP) 7/28 >>> 8/4 Azithromycin  7/28 >>> 8/1 Vanc (Empyema) 7/28 >>> 8/4 Zosyn 8/4 >>>8/07 Cipro 8/4 >>>8/07 Zyvox 8/4 >>>  8/07  A:   Community acquired pneumonia with empyema, remains culture negative.  Has persistent fever>>?VAP vs Line.  Fever better after CVL change 8/05.   Candidal rash around neck 8/08>>improved 8/09.  P:   Added nystatin powder 8/08  ENDOCRINE  Lab 10/10/11 1541 10/10/11 1147 10/10/11 0721 10/10/11 0409 10/10/11 0013  GLUCAP 141* 136* 123* 129* 119*   A:   Hyperglycemia.   Hx of hypothyroidism.  P:   SSI/CBG Synthroid  NEUROLOGIC  A:   Mild dementia due to Down syndrome.   Acute encephalopathy due to sedation.   P:   Limit sedation as tolerated Continue buspirone, namenda Resume prozac at 20 daily and increase as needed Daily WUA   BEST PRACTICE / DISPOSITION Level of Care:  ICU Primary Service:  PCCM  Consultants: TCTS Cornelius Moras) Code Status:  Full Diet:  TF DVT Px:  Heparin GI Px:  Protonix Skin Integrity:  Intact Social / Family:    - \ NP conversation with mom for 45 minutes on phone: mom very unhappy with care provided 10/07/11  - 10/09/11: Mom very unhappy that patient not extubated after being told possible extubation.   - 10/10/11: family meeting with mom, dad and sister  Vickie: explained up and down nature of crtical illness, need for trach as opportunity to give her time to get better and prolonged course in order of weeks or months to go home and need for LTAC. They wish to proceed with trach but now are demanding trach stat. DR Z coordinating     The patient is critically ill with multiple organ systems failure and requires high complexity decision making for assessment and support, frequent evaluation and titration of therapies, application of advanced monitoring technologies and extensive interpretation of multiple databases.   Critical Care Time devoted to patient care services described in this note is  31  Minutes.  Dr. Kalman Shan, M.D., [facility].C.P Pulmonary and Critical Care Medicine Staff Physician  System [place] [facility] Pager: [phone], If no answer or between  15:00h - 7:00h: call [phone]  10/10/2011 5:20 PM

## 2011-10-10 NOTE — Progress Notes (Signed)
Patient again very agitated with attempt to wean sedation.  RASS 3, does not follow commands, no improvement with nonpharmacological interventions.  Sedation resumed at 100%.

## 2011-10-10 NOTE — Progress Notes (Signed)
CRITICAL VALUE ALERT  Critical value received: calcium 6.3  Date of notification: 10/10/11  Time of notification:  0550  Critical value read back:yes  Nurse who received alert:  Earney Mallet, RN  MD notified (1st page):  Dr. Lanora Manis Deterding  Time of first page: [phone]  MD notified (2nd page):  Time of second page:  Responding MD: Dr. Darrick Penna  Time MD responded: [phone]

## 2011-10-11 ENCOUNTER — Inpatient Hospital Stay (HOSPITAL_COMMUNITY): Payer: Medicare Other

## 2011-10-11 ENCOUNTER — Encounter (HOSPITAL_COMMUNITY): Payer: Self-pay | Admitting: Anesthesiology

## 2011-10-11 ENCOUNTER — Encounter (HOSPITAL_COMMUNITY)
Admission: EM | Disposition: A | Discharge status: Nursing Home (Includes ICF) | Payer: Self-pay | Source: Home / Self Care | Attending: Internal Medicine

## 2011-10-11 ENCOUNTER — Inpatient Hospital Stay (HOSPITAL_COMMUNITY): Payer: Medicare Other | Admitting: Anesthesiology

## 2011-10-11 HISTORY — PX: TRACHEOSTOMY TUBE PLACEMENT: SHX814

## 2011-10-11 LAB — GLUCOSE, CAPILLARY
Glucose-Capillary: 106 mg/dL — ABNORMAL HIGH (ref 70–99)
Glucose-Capillary: 109 mg/dL — ABNORMAL HIGH (ref 70–99)
Glucose-Capillary: 115 mg/dL — ABNORMAL HIGH (ref 70–99)
Glucose-Capillary: 118 mg/dL — ABNORMAL HIGH (ref 70–99)
Glucose-Capillary: 129 mg/dL — ABNORMAL HIGH (ref 70–99)
Glucose-Capillary: 132 mg/dL — ABNORMAL HIGH (ref 70–99)

## 2011-10-11 LAB — BASIC METABOLIC PANEL
BUN: 30 mg/dL — ABNORMAL HIGH (ref 6–23)
Chloride: 105 mEq/L (ref 96–112)
GFR calc Af Amer: >90 mL/min (ref >90)
GFR calc non Af Amer: 81 mL/min — ABNORMAL LOW (ref >90)
Potassium: 4.1 mEq/L (ref 3.5–5.1)
Sodium: 147 mEq/L — ABNORMAL HIGH (ref 135–145)

## 2011-10-11 LAB — PROTIME-INR: INR: 1.32 (ref 0.00–1.49)

## 2011-10-11 LAB — CBC
HCT: 26.4 % — ABNORMAL LOW (ref 36.0–46.0)
MCH: 32.2 pg (ref 26.0–34.0)
MCV: 107.8 fL — ABNORMAL HIGH (ref 78.0–100.0)
Platelets: 279 10*3/uL (ref 150–400)
RBC: 2.45 MIL/uL — ABNORMAL LOW (ref 3.87–5.11)

## 2011-10-11 LAB — PRO B NATRIURETIC PEPTIDE: Pro B Natriuretic peptide (BNP): 486.8 pg/mL — ABNORMAL HIGH (ref 0–125)

## 2011-10-11 SURGERY — CREATION, TRACHEOSTOMY
Anesthesia: General | Site: Neck | Wound class: Clean

## 2011-10-11 MED ORDER — SODIUM CHLORIDE 0.9 % IR SOLN
Status: DC | PRN
  Administered 2011-10-11: 1

## 2011-10-11 MED ORDER — VANCOMYCIN HCL IN DEXTROSE 1-5 GM/200ML-% IV SOLN
1000.0000 mg | Freq: Two times a day (BID) | INTRAVENOUS | Status: DC
  Administered 2011-10-11 – 2011-10-12 (×2): 1000 mg via INTRAVENOUS
  Filled 2011-10-11 (×4): qty 200

## 2011-10-11 MED ORDER — ONDANSETRON HCL 4 MG/2ML IJ SOLN
INTRAMUSCULAR | Status: DC | PRN
  Administered 2011-10-11: 4 mg via INTRAVENOUS

## 2011-10-11 MED ORDER — PIPERACILLIN-TAZOBACTAM 3.375 G IVPB
3.3750 g | Freq: Three times a day (TID) | INTRAVENOUS | Status: DC
  Administered 2011-10-11 – 2011-10-12 (×3): 3.375 g via INTRAVENOUS
  Filled 2011-10-11 (×5): qty 50

## 2011-10-11 MED ORDER — LACTATED RINGERS IV SOLN
INTRAVENOUS | Status: DC | PRN
  Administered 2011-10-11: 12:00:00 via INTRAVENOUS

## 2011-10-11 MED ORDER — ROCURONIUM BROMIDE 100 MG/10ML IV SOLN
INTRAVENOUS | Status: DC | PRN
  Administered 2011-10-11: 50 mg via INTRAVENOUS

## 2011-10-11 MED ORDER — FENTANYL CITRATE 0.05 MG/ML IJ SOLN
INTRAMUSCULAR | Status: DC | PRN
  Administered 2011-10-11: 50 ug via INTRAVENOUS

## 2011-10-11 MED ORDER — MIDAZOLAM HCL 5 MG/5ML IJ SOLN
INTRAMUSCULAR | Status: DC | PRN
  Administered 2011-10-11: 2 mg via INTRAVENOUS

## 2011-10-11 MED FILL — Perflutren Lipid Microsphere IV Susp 6.52 MG/ML: INTRAVENOUS | Qty: 2 | Status: AC

## 2011-10-11 SURGICAL SUPPLY — 45 items
APL SKNCLS STERI-STRIP NONHPOA (GAUZE/BANDAGES/DRESSINGS)
BENZOIN TINCTURE PRP APPL 2/3 (GAUZE/BANDAGES/DRESSINGS) IMPLANT
BLADE SURG 11 STRL SS (BLADE) IMPLANT
BLADE SURG 15 STRL LF DISP TIS (BLADE) ×1 IMPLANT
BLADE SURG 15 STRL SS (BLADE) ×2
BLADE SURG ROTATE 9660 (MISCELLANEOUS) IMPLANT
CANISTER SUCTION 2500CC (MISCELLANEOUS) ×2 IMPLANT
CLEANER TIP ELECTROSURG 2X2 (MISCELLANEOUS) ×2 IMPLANT
CLOTH BEACON ORANGE TIMEOUT ST (SAFETY) ×2 IMPLANT
COVER SURGICAL LIGHT HANDLE (MISCELLANEOUS) ×2 IMPLANT
DALE 240 BLUE TRACHEOSTOMY TUBE HOLDER WITH MOISTURE REPELLENT NECKBAND LATEX-FREE ×1 IMPLANT
DECANTER SPIKE VIAL GLASS SM (MISCELLANEOUS) ×1 IMPLANT
ELECT COATED BLADE 2.86 ST (ELECTRODE) ×2 IMPLANT
ELECT REM PT RETURN 9FT ADLT (ELECTROSURGICAL) ×2
ELECTRODE REM PT RTRN 9FT ADLT (ELECTROSURGICAL) ×1 IMPLANT
GAUZE SPONGE 4X4 16PLY XRAY LF (GAUZE/BANDAGES/DRESSINGS) ×2 IMPLANT
GLOVE BIO SURGEON STRL SZ7 (GLOVE) ×1 IMPLANT
GLOVE BIOGEL PI IND STRL 7.0 (GLOVE) IMPLANT
GLOVE BIOGEL PI INDICATOR 7.0 (GLOVE) ×3
GLOVE ECLIPSE 7.5 STRL STRAW (GLOVE) ×2 IMPLANT
GLOVE SURG SS PI 7.0 STRL IVOR (GLOVE) ×2 IMPLANT
GOWN STRL NON-REIN LRG LVL3 (GOWN DISPOSABLE) ×5 IMPLANT
KIT BASIN OR (CUSTOM PROCEDURE TRAY) ×2 IMPLANT
KIT ROOM TURNOVER OR (KITS) ×2 IMPLANT
MARKER SKIN DUAL TIP RULER LAB (MISCELLANEOUS) ×1 IMPLANT
NDL HYPO 25GX1X1/2 BEV (NEEDLE) IMPLANT
NEEDLE HYPO 25GX1X1/2 BEV (NEEDLE) IMPLANT
NS IRRIG 1000ML POUR BTL (IV SOLUTION) ×2 IMPLANT
PACK EENT II TURBAN DRAPE (CUSTOM PROCEDURE TRAY) ×2 IMPLANT
PAD ARMBOARD 7.5X6 YLW CONV (MISCELLANEOUS) ×3 IMPLANT
PENCIL FOOT CONTROL (ELECTRODE) ×2 IMPLANT
SPONGE DRAIN TRACH 4X4 STRL 2S (GAUZE/BANDAGES/DRESSINGS) ×1 IMPLANT
SUT CHROMIC 2 0 SH (SUTURE) ×2 IMPLANT
SUT ETHILON 3 0 PS 1 (SUTURE) ×2 IMPLANT
SUT SILK 4 0 (SUTURE) ×2
SUT SILK 4 0 TIE 10X30 (SUTURE) ×2 IMPLANT
SUT SILK 4 0 TIES 17X18 (SUTURE) ×1 IMPLANT
SUT SILK 4-0 18XBRD TIE 12 (SUTURE) IMPLANT
SYR 20CC LL (SYRINGE) ×2 IMPLANT
SYR CONTROL 10ML LL (SYRINGE) IMPLANT
TOWEL OR 17X24 6PK STRL BLUE (TOWEL DISPOSABLE) ×2 IMPLANT
TOWEL OR 17X26 10 PK STRL BLUE (TOWEL DISPOSABLE) ×2 IMPLANT
TUBE CONNECTING 12X1/4 (SUCTIONS) ×2 IMPLANT
TUBE TRACH SHILEY 8 DIST CUF (TUBING) ×1 IMPLANT
WATER STERILE IRR 1000ML POUR (IV SOLUTION) ×1 IMPLANT

## 2011-10-11 NOTE — Anesthesia Preprocedure Evaluation (Addendum)
Anesthesia Evaluation  Patient identified by MRN, date of birth, ID band Patient awake and Patient unresponsive    Reviewed: Allergy & Precautions, H&P , NPO status , Patient's Chart, lab work & pertinent test results, reviewed documented beta blocker date and time , Unable to perform ROS - Chart review only  Airway Mallampati: III TM Distance: <3 FB Neck ROM: Full  Mouth opening: Limited Mouth Opening  Dental  (+) Teeth Intact   Pulmonary shortness of breath, at rest and lying, pneumonia -, unresolved,  + rhonchi   + decreased breath sounds+ wheezing      Cardiovascular negative cardio ROS  Rhythm:regular Rate:Normal     Neuro/Psych PSYCHIATRIC DISORDERS    GI/Hepatic negative GI ROS, Neg liver ROS,   Endo/Other  Hypothyroidism   Renal/GU negative Renal ROS  negative genitourinary   Musculoskeletal negative musculoskeletal ROS (+)   Abdominal (+)  Abdomen: soft. Bowel sounds: normal.  Peds negative pediatric ROS (+)  Hematology negative hematology ROS (+)   Anesthesia Other Findings Intubated Currently 7.5 Ett.  22 at teeth  Reproductive/Obstetrics negative OB ROS                          Anesthesia Physical Anesthesia Plan  ASA: IV  Anesthesia Plan: General ETT   Post-op Pain Management:    Induction:   Airway Management Planned:   Additional Equipment:   Intra-op Plan:   Post-operative Plan:   Informed Consent:   History available from chart only  Plan Discussed with: CRNA, Surgeon and Anesthesiologist  Anesthesia Plan Comments:         Anesthesia Quick Evaluation

## 2011-10-11 NOTE — Progress Notes (Signed)
Name: [name] MRN: [id number] DOB: 01/10/1971    LOS: 17  Referring Provider:  [facility] Reason for Referral:  Postop respiratory failure    PULMONARY / CRITICAL CARE MEDICINE   Brief patient description:  41 year old white female Down's patient with pneumonia and pleural effusion, s/p decortication for empyema, post op resp failure.    DEVICES ETT:  7/28 >>> 7/29, 7/30 >>>8/14, TRACH [name] 8/14>> Lines: Rt IJ CVL 7/28>>8/05, Lt PICC 8/05>>     Events Since Admission: 7/28  Failed thoracentesis due to loculations, decrtication for empyema, reintubated in PACU 7/29  Extubated 7/30  Reintubated, hypoxia, distress 7/31  Removal chest tube 8/4    Increased oxygen requirements / WBC; ARDS vs edema; ARDS protocol, ABx changes, Lasix trial 8/9    Decreased tolerance to sbt, agitation 8/10  Sedated, comfortable on vent, no acute distress 8/11  Trial SBT with Mother at bedside, on low dose fentanyl - 8/12 and 8/13: Did well on SBT but with fent gtt on board. OFf fent gtt -> RASS +4 and vent dysnchrony. Also, cxr worse with pulmonary edema chf pattern    SUBJECTIVE/OVERNIGHT/INTERVAL HX 10/11/11: Trach today but also febrile 102.36F   Vital Signs: Temp:  [98 F (36.7 C)-102.9 F (39.4 C)] 98 F (36.7 C) (08/14 1150) Pulse Rate:  [7-128] 75  (08/14 1200) Resp:  [9-49] 14  (08/14 1200) BP: (81-109)/(34-65) 93/45 mmHg (08/14 1200) SpO2:  [90 %-100 %] 99 % (08/14 1200) FiO2 (%):  [40 %-60 %] 60 % (08/14 1336)  Physical Examination: General:  No distress Neuro: RASS 0 on fent gtt and following commands. RASS +4 off sedation gtt HEENT: ETT, OG in place, enlarged tongue, rash around neck>>much improved Neck:  Rt IJ site clean Cardiovascular:  s1s2 regular Lungs:  Decreased breath sounds, scattered rhonchi Abdomen:  Soft nontender no organomegaly Musculoskeletal:  ankle edema Skin:  Intact  Dg Chest Port 1 View  10/11/2011  *RADIOLOGY REPORT*  Clinical Data: Respiratory  failure.  PORTABLE CHEST - 1 VIEW  Comparison: 10/10/2011  Findings: Endotracheal tube tip approximately 1.5 cm above the carina.  Nasogastric tube and central line shows stable positioning.  Lungs continue to show diffuse bilateral airspace disease pattern and probable component of pleural effusions. Findings are suggestive of ARDS.  The heart size is stable.  IMPRESSION: Stable pulmonary airspace disease suggestive of ARDS.  Original Report Authenticated By: Reola Calkins, M.D.   Dg Chest Port 1 View  10/10/2011  *RADIOLOGY REPORT*  Clinical Data: ARDS.  Shortness of breath.  PORTABLE CHEST - 1 VIEW  Comparison: 10/09/2011.  Findings: Endotracheal tube, central venous catheter, nasogastric tube all in good position. Left arm PICC line has been repositioned and now lies with its tip near the SVC/RA junction.  Cardiomegaly. Bilateral airspace disease consistent with ARDS remains stable. Left pleural effusion.  Cannot exclude layering right pleural effusion.  No visible pneumothorax.  IMPRESSION: Stable bilateral airspace disease consistent with ARDS.  Successful repositioning left PICC line.  Original Report Authenticated By: Elsie Stain, M.D.    ASSESSMENT AND PLAN  PULMONARY No results found for this basename: PHART:5,PCO2:5,PCO2ART:5,PO2ART:5,HCO3:5,O2SAT:5 in the last 168 hours Ventilator Settings:  Vent Mode:  [-] PRVC FiO2 (%):  [40 %-60 %] 60 % Set Rate:  [12 bmp] 12 bmp Vt Set:  [350 mL] 350 mL PEEP:  [5 cmH20] 5 cmH20 Pressure Support:  [12 cmH20] 12 cmH20 Plateau Pressure:  [20 cmH20] 20 cmH20   A:   Empyema s/p decortication.  Pneumonia.   Acute respiratory failure.  ? ARDS / pulmonary edema given interstitial infiltrates / increased oxygen requirements.  Probable OSA. Mom worried about trach   -on 8//14/13: Janina Mayo by [name]  P:   Full vent support    CARDIOVASCULAR  No results found for this basename: TROPONINI:5 in the last 168 hours ,  Lab 10/11/11 0525  10/10/11 0503  PROBNP 486.8* 355.1*    \ A:  On 10/09/11: concern for CHF on CXR On 10/10/11: echo normal.   P:  - diurese as tolerated   RENAL  Lab 10/11/11 0525 10/10/11 0503 10/09/11 0413 10/08/11 0500 10/07/11 0558  NA 147* 144 138 143 143  K 4.1 3.9 -- -- --  CL 105 109 100 102 101  CO2 37* 30 33* 38* 37*  BUN 30* 27* 21 23 23   CREATININE 0.88 0.81 0.77 0.85 0.92  CALCIUM 8.3* 6.3* 7.4* 8.1* 7.8*  MG 2.3 -- 1.9 2.3 --  PHOS 3.8 -- -- -- --   Intake/Output      08/13 0701 - 08/14 0700 08/14 0701 - 08/15 0700   I.V. (mL/kg) 772.3 (7.1) 500 (4.6)   NG/GT 405    Total Intake(mL/kg) 1177.3 (10.8) 500 (4.6)   Urine (mL/kg/hr) 848 (0.3) 260 (0.3)   Stool 150    Blood  25   Total Output 998 285   Net +179.3 +215         A:   normal  P:   monitor  GASTROINTESTINAL  Lab 10/05/11 0405  AST 40*  ALT 91*  ALKPHOS 99  BILITOT 0.2*  PROT 5.5*  ALBUMIN 1.4*   Abd u/s 8/05>>non-specific slight echogenic periportal structures.   Acute hepatitis panel negative from 8/06.  A: Nil acute on 8/12 (Increased LFT>>resolved).  P:   Continue tube feeds   HEMATOLOGIC  Lab 10/11/11 0525 10/09/11 0413 10/08/11 0500 10/06/11 0410 10/05/11 0405  HGB 7.9* 7.5* 7.8* 8.4* 8.7*  HCT 26.4* 24.1* 25.3* 26.5* 27.3*  PLT 279 138* 227 245 231  INR 1.32 -- -- -- --  APTT -- -- -- -- --   A:   Mild anemia of critical illness, stable.  P:  F/u CBC intermittently Transfuse for Hb < 7  INFECTIOUS  Lab 10/11/11 0525 10/09/11 0413 10/08/11 0500 10/06/11 0410 10/05/11 0405  WBC 11.4* 9.8 10.2 11.7* 11.8*  PROCALCITON -- -- -- -- --   Cultures: 7/28  Blood >>Diphtheroids 7/28  Pleural fluid >>negative 7/28 Sputum>>negative 8/04 Sputum>>Candida 8/05 Urine>>negative  Antibiotics: Rocephin (CAP) 7/28 >>> 8/4 Azithromycin  7/28 >>> 8/1 Vanc (Empyema) 7/28 >>> 8/4 Zosyn 8/4 >>>8/07 Cipro 8/4 >>>8/07 Zyvox 8/4 >>>8/07  A:   Community acquired pneumonia with empyema,  remains culture negative.  Has persistent fever>>?VAP vs Line.  Fever better after CVL change 8/05.   Candidal rash around neck 8/08>>improved 8/09 with nystatin powder   - on 10/11/11: New fever 103F and rising wBC  P:   Pan cultures Check sepsis biomarkers Abx   ENDOCRINE  Lab 10/11/11 1148 10/11/11 0823 10/11/11 0413 10/10/11 2356 10/10/11 1944  GLUCAP 109* 106* 101* 115* 125*   A:   Hyperglycemia.   Hx of hypothyroidism.  P:   SSI/CBG Synthroid  NEUROLOGIC  A:   Mild dementia due to Down syndrome.   Acute encephalopathy due to sedation.   P:   Limit sedation as tolerated Continue buspirone, namenda Resume prozac at 20 daily and increase as needed Daily WUA   BEST PRACTICE /  DISPOSITION Level of Care:  ICU -> Select hospital referral requested Primary Service:  PCCM  Consultants: TCTS Cornelius Moras) Code Status:  Full Diet:  TF DVT Px:  Heparin GI Px:  Protonix Skin Integrity:  Intact Social / Family:    - \ NP conversation with mom for 45 minutes on phone: mom very unhappy with care provided 10/07/11  - 10/09/11: Mom very unhappy that patient not extubated after being told possible extubation.   - 10/10/11: family meeting with mom, dad and sister Vickie: explained up and down nature of crtical illness, need for trach as opportunity to give her time to get better and prolonged course in order of weeks or months to go home and need for LTAC. They wish to proceed with trach but now are demanding trach stat. DR Z coordinating     The patient is critically ill with multiple organ systems failure and requires high complexity decision making for assessment and support, frequent evaluation and titration of therapies, application of advanced monitoring technologies and extensive interpretation of multiple databases.   Critical Care Time devoted to patient care services described in this note is  31  Minutes.  Dr. Kalman Shan, M.D., [facility].C.P Pulmonary and Critical Care  Medicine Staff Physician [facility] [place] [facility] Pager: [phone], If no answer or between  15:00h - 7:00h: call [phone]  10/11/2011 2:48 PM

## 2011-10-11 NOTE — Op Note (Signed)
OPERATIVE REPORT  DATE OF SURGERY: 10/11/2011  PATIENT:  [name],  41 y.o. female  PRE-OPERATIVE DIAGNOSIS:  Prolonged Intubation  POST-OPERATIVE DIAGNOSIS:  * No post-op diagnosis entered *  PROCEDURE:  Procedure(s): TRACHEOSTOMY  SURGEON:  Susy Frizzle, MD  ASSISTANTS: none   ANESTHESIA:   general  EBL:  0 ml  DRAINS: none   LOCAL MEDICATIONS USED:  NONE  SPECIMEN:  No Specimen  COUNTS:  YES  PROCEDURE DETAILS: The patient was taken to the operating room and placed on the operating table in the supine position. Following induction of general endotracheal anesthesia a shoulder roll was placed beneath the shoulder blades to extend the neck. The neck was prepped and draped in a standard fashion. A vertical midline incision was created just above the sternal notch using electrocautery. Cautery was then used to carry this incision down through the superficial fascia. The strap muscles were divided at the midline. The fibrofatty tissue in front of the trachea was dissected down to the tracheal wall. The thyroid isthmus was not identified. Tracheotomy was created between the second and third ring with a lower tracheal ring flap that was secured to the cervical skin with a chromic suture. Using direct visualization the #8 cuffed tracheostomy tube was placed without difficulty and secured in place using Velcro straps and nylon sutures. Patient was then transferred back to the intensive care unit without any complications.   PATIENT DISPOSITION:  PACU - hemodynamically stable.

## 2011-10-11 NOTE — Transfer of Care (Signed)
Immediate Anesthesia Transfer of Care Note  Patient: [name]  Procedure(s) Performed: Procedure(s) (LRB): TRACHEOSTOMY (N/A)  Patient Location: PACU and ICU  Anesthesia Type: General  Level of Consciousness: sedated and unresponsive  Airway & Oxygen Therapy: Patient connected to T-piece oxygen and Patient remains intubated per anesthesia plan  Post-op Assessment: Report given to PACU RN and Post -op Vital signs reviewed and stable  Post vital signs: Reviewed and stable  Complications: No apparent anesthesia complications

## 2011-10-11 NOTE — OR Nursing (Signed)
Foley catheter bag contained roughly of orange, cloudy urine upon arrival to the operating room.   Oralia Manis, RN

## 2011-10-11 NOTE — Preoperative (Signed)
Beta Blockers   Reason not to administer Beta Blockers:Not Applicable 

## 2011-10-11 NOTE — Progress Notes (Signed)
ANTIBIOTIC CONSULT NOTE - INITIAL  Pharmacy Consult for vancomycin and zosyn Indication: pneumonia  No Known Allergies  Patient Measurements: Height: 5\' 2"  (157.5 cm) Weight: 240 lb 1.3 oz (108.9 kg) IBW/kg (Calculated) : 50.1  Adjusted Body Weight:   Vital Signs: Temp: 98 F (36.7 C) (08/14 1150) Temp src: Oral (08/14 1150) BP: 93/45 mmHg (08/14 1200) Pulse Rate: 75  (08/14 1200) Intake/Output from previous day: 08/13 0701 - 08/14 0700 In: 1177.3 [I.V.:772.3; NG/GT:405] Out: 998 [Urine:848; Stool:150] Intake/Output from this shift: Total I/O In: 500 [I.V.:500] Out: 285 [Urine:260; Blood:25]  Labs:  [facility] 10/11/11 0525 10/10/11 0503 10/09/11 0413  WBC 11.4* -- 9.8  HGB 7.9* -- 7.5*  PLT 279 -- 138*  LABCREA -- -- --  CREATININE 0.88 0.81 0.77   Estimated Creatinine Clearance: 97.8 ml/min (by C-G formula based on Cr of 0.88). No results found for this basename: VANCOTROUGH:2,VANCOPEAK:2,VANCORANDOM:2,GENTTROUGH:2,GENTPEAK:2,GENTRANDOM:2,TOBRATROUGH:2,TOBRAPEAK:2,TOBRARND:2,AMIKACINPEAK:2,AMIKACINTROU:2,AMIKACIN:2, in the last 72 hours   Microbiology: Recent Results (from the past 720 hour(s))  CULTURE, BLOOD (ROUTINE X 2)     Status: Normal   Collection Time   09/24/11  5:25 AM      Component Value Range Status Comment   Specimen Description BLOOD RIGHT HAND   Final    Special Requests BOTTLES DRAWN AEROBIC ONLY 4 CC   Final    Culture  Setup Time 09/24/2011 11:08   Final    Culture NO GROWTH 5 DAYS   Final    Report Status 09/30/2011 FINAL   Final   CULTURE, BLOOD (ROUTINE X 2)     Status: Normal   Collection Time   09/24/11  5:25 AM      Component Value Range Status Comment   Specimen Description BLOOD LEFT HAND   Final    Special Requests BOTTLES DRAWN AEROBIC AND ANAEROBIC 3 CC EACH   Final    Culture  Setup Time 09/24/2011 11:08   Final    Culture     Final    Value: DIPHTHEROIDS(CORYNEBACTERIUM SPECIES)     Note: Standardized susceptibility testing  for this organism is not available.     Note: Gram Stain Report Called to,Read Back By and Verified With: JENNIFER AT 0230 09/29/11 BY SNOLO   Report Status 10/01/2011 FINAL   Final   MRSA PCR SCREENING     Status: Abnormal   Collection Time   09/24/11  8:28 AM      Component Value Range Status Comment   MRSA by PCR POSITIVE (*) NEGATIVE Final   BODY FLUID CULTURE     Status: Normal   Collection Time   09/24/11  3:24 PM      Component Value Range Status Comment   Specimen Description PLEURAL LEFT FLUID   Final    Special Requests TUBE 3   Final    Gram Stain     Final    Value: FEW WBC PRESENT, PREDOMINANTLY PMN     NO ORGANISMS SEEN   Culture NO GROWTH 3 DAYS   Final    Report Status 09/28/2011 FINAL   Final   CULTURE, RESPIRATORY     Status: Normal   Collection Time   09/24/11  6:35 PM      Component Value Range Status Comment   Specimen Description TRACHEAL ASPIRATE   Final    Special Requests NONE   Final    Gram Stain     Final    Value: FEW WBC PRESENT,BOTH PMN AND MONONUCLEAR     NO  SQUAMOUS EPITHELIAL CELLS SEEN     NO ORGANISMS SEEN   Culture Non-Pathogenic Oropharyngeal-type Flora Isolated.   Final    Report Status 09/26/2011 FINAL   Final   CULTURE, RESPIRATORY     Status: Normal   Collection Time   10/01/11  2:55 PM      Component Value Range Status Comment   Specimen Description TRACHEAL ASPIRATE   Final    Special Requests NONE   Final    Gram Stain     Final    Value: NO WBC SEEN     NO SQUAMOUS EPITHELIAL CELLS SEEN     NO ORGANISMS SEEN   Culture FEW YEAST CONSISTENT WITH CANDIDA SPECIES   Final    Report Status 10/03/2011 FINAL   Final   URINE CULTURE     Status: Normal   Collection Time   10/02/11  9:46 AM      Component Value Range Status Comment   Specimen Description URINE, CATHETERIZED   Final    Special Requests NONE   Final    Culture  Setup Time 10/02/2011 10:25   Final    Colony Count NO GROWTH   Final    Culture NO GROWTH   Final    Report Status  10/03/2011 FINAL   Final   CULTURE, BLOOD (ROUTINE X 2)     Status: Normal   Collection Time   10/02/11 10:58 AM      Component Value Range Status Comment   Specimen Description BLOOD LEFT ARM   Final    Special Requests BOTTLES DRAWN AEROBIC AND ANAEROBIC 10CC   Final    Culture  Setup Time 10/02/2011 19:31   Final    Culture NO GROWTH 5 DAYS   Final    Report Status 10/08/2011 FINAL   Final   CULTURE, BLOOD (ROUTINE X 2)     Status: Normal   Collection Time   10/02/11 11:05 AM      Component Value Range Status Comment   Specimen Description BLOOD LEFT ARM   Final    Special Requests BOTTLES DRAWN AEROBIC ONLY 10CC   Final    Culture  Setup Time 10/02/2011 19:31   Final    Culture NO GROWTH 5 DAYS   Final    Report Status 10/08/2011 FINAL   Final     Medical History: Past Medical History  Diagnosis Date  . Bronchitis   . Down syndrome   . Pneumonia     multpile times  . Dementia   . Empyema of left pleural space 09/24/2011    Medications:  Scheduled:    . antiseptic oral rinse  15 mL Mouth Rinse QID  . busPIRone  15 mg Oral BID  . chlorhexidine  15 mL Mouth Rinse BID  . feeding supplement (OXEPA)  1,000 mL Per Tube Q24H  . feeding supplement  30 mL Per Tube QID  . FLUoxetine  20 mg Per Tube Daily  . heparin subcutaneous  5,000 Units Subcutaneous Q8H  . insulin aspart  0-24 Units Subcutaneous Q4H  . levothyroxine  112 mcg Oral QAC breakfast  . lidocaine  1 patch Transdermal Q24H  . memantine  10 mg Oral BID  . multivitamin  5 mL Per Tube Daily  . nystatin   Topical BID  . pantoprazole sodium  40 mg Per Tube Q1200  . sodium chloride  10-40 mL Intracatheter Q12H   Infusions:    . sodium chloride 20 mL/hr at 10/10/11  1811  . fentaNYL infusion INTRAVENOUS 25 mcg/hr (10/11/11 0801)  . midazolam (VERSED) infusion Stopped (10/11/11 1121)  . DISCONTD: dexmedetomidine Stopped (10/10/11 1938)   Assessment: 41 yo female with suspected pneumonia will be put back on  vancomycin and zosyn therapy.  SCr 0.88 (CrCl ~98)  Goal of Therapy:  Vancomycin trough level 15-20 mcg/ml  Plan:  1) Vancomycin 1g iv q12h and zosyn 3.375g iv q8h (4h infusion) 2) Monitor cx and draw vancomycin trough when it's appropriate.  Mineola Duan, Tsz-Yin 10/11/2011,2:56 PM

## 2011-10-12 ENCOUNTER — Encounter (HOSPITAL_COMMUNITY): Payer: Self-pay | Admitting: Certified Registered"

## 2011-10-12 ENCOUNTER — Other Ambulatory Visit (HOSPITAL_COMMUNITY): Payer: Self-pay

## 2011-10-12 ENCOUNTER — Inpatient Hospital Stay (HOSPITAL_COMMUNITY): Payer: Medicare Other

## 2011-10-12 ENCOUNTER — Inpatient Hospital Stay
Admission: AD | Admit: 2011-10-12 | Discharge: 2011-11-10 | Disposition: A | Discharge status: Home Health | Payer: Medicare Other | Source: Ambulatory Visit | Attending: Internal Medicine | Admitting: Internal Medicine

## 2011-10-12 DIAGNOSIS — D72829 Elevated white blood cell count, unspecified: Secondary | ICD-10-CM

## 2011-10-12 DIAGNOSIS — J9601 Acute respiratory failure with hypoxia: Secondary | ICD-10-CM

## 2011-10-12 DIAGNOSIS — R739 Hyperglycemia, unspecified: Secondary | ICD-10-CM

## 2011-10-12 DIAGNOSIS — J90 Pleural effusion, not elsewhere classified: Secondary | ICD-10-CM

## 2011-10-12 DIAGNOSIS — J869 Pyothorax without fistula: Secondary | ICD-10-CM

## 2011-10-12 DIAGNOSIS — J189 Pneumonia, unspecified organism: Secondary | ICD-10-CM

## 2011-10-12 LAB — CBC
HCT: 22.8 % — ABNORMAL LOW (ref 36.0–46.0)
HCT: 30.4 % — ABNORMAL LOW (ref 36.0–46.0)
Hemoglobin: 6.7 g/dL — CL (ref 12.0–15.0)
Hemoglobin: 9.4 g/dL — ABNORMAL LOW (ref 12.0–15.0)
MCH: 32.9 pg (ref 26.0–34.0)
MCHC: 30.3 g/dL (ref 30.0–36.0)
MCHC: 30.9 g/dL (ref 30.0–36.0)
MCV: 105.2 fL — ABNORMAL HIGH (ref 78.0–100.0)
RBC: 2.1 MIL/uL — ABNORMAL LOW (ref 3.87–5.11)
RDW: 17.8 % — ABNORMAL HIGH (ref 11.5–15.5)

## 2011-10-12 LAB — BASIC METABOLIC PANEL
BUN: 29 mg/dL — ABNORMAL HIGH (ref 6–23)
CO2: 35 mEq/L — ABNORMAL HIGH (ref 19–32)
GFR calc non Af Amer: 85 mL/min — ABNORMAL LOW (ref >90)
Glucose, Bld: 141 mg/dL — ABNORMAL HIGH (ref 70–99)
Potassium: 3.8 mEq/L (ref 3.5–5.1)

## 2011-10-12 LAB — PROCALCITONIN: Procalcitonin: 0.16 ng/mL

## 2011-10-12 LAB — GLUCOSE, CAPILLARY: Glucose-Capillary: 127 mg/dL — ABNORMAL HIGH (ref 70–99)

## 2011-10-12 LAB — PREPARE RBC (CROSSMATCH)

## 2011-10-12 MED ORDER — BISACODYL 5 MG PO TBEC: 10.0000 mg | DELAYED_RELEASE_TABLET | Freq: Every day | ORAL | Status: AC | PRN

## 2011-10-12 MED ORDER — LEVOTHYROXINE SODIUM 112 MCG PO TABS: 112.0000 ug | ORAL_TABLET | Freq: Every day | ORAL | Status: DC

## 2011-10-12 MED ORDER — FLUOXETINE HCL 20 MG/5ML PO SOLN: 20.0000 mg | Freq: Every day | ORAL | Status: DC

## 2011-10-12 MED ORDER — INSULIN ASPART 100 UNIT/ML ~~LOC~~ SOLN: 0.0000 [IU] | SUBCUTANEOUS | Status: DC

## 2011-10-12 MED ORDER — ACETAMINOPHEN 160 MG/5ML PO SOLN: 650.0000 mg | Freq: Four times a day (QID) | ORAL | Status: AC | PRN

## 2011-10-12 MED ORDER — PRO-STAT SUGAR FREE PO LIQD: 30.0000 mL | Freq: Four times a day (QID) | ORAL | Status: DC

## 2011-10-12 MED ORDER — SODIUM CHLORIDE 0.9 % IV SOLN: 2.0000 mg/h | INTRAVENOUS | Status: DC

## 2011-10-12 MED ORDER — PIPERACILLIN-TAZOBACTAM 3.375 G IVPB: 3.3750 g | Freq: Three times a day (TID) | INTRAVENOUS | Status: DC

## 2011-10-12 MED ORDER — VANCOMYCIN HCL IN DEXTROSE 1-5 GM/200ML-% IV SOLN: 1000.0000 mg | Freq: Two times a day (BID) | INTRAVENOUS | Status: DC

## 2011-10-12 MED ORDER — IPRATROPIUM BROMIDE 0.02 % IN SOLN: 0.5000 mg | Freq: Four times a day (QID) | RESPIRATORY_TRACT | Status: DC | PRN

## 2011-10-12 MED ORDER — OXEPA PO LIQD: ORAL | Status: DC

## 2011-10-12 MED ORDER — SODIUM CHLORIDE 0.9 % IV SOLN: 50.0000 ug/h | INTRAVENOUS | Status: DC

## 2011-10-12 MED ORDER — BUSPIRONE HCL 15 MG PO TABS: 15.0000 mg | ORAL_TABLET | Freq: Two times a day (BID) | ORAL | Status: DC

## 2011-10-12 MED ORDER — PANTOPRAZOLE SODIUM 40 MG PO PACK: 40.0000 mg | PACK | Freq: Every day | ORAL | Status: DC

## 2011-10-12 MED ORDER — MEMANTINE HCL 10 MG PO TABS: 10.0000 mg | ORAL_TABLET | Freq: Two times a day (BID) | ORAL | Status: DC

## 2011-10-12 MED ORDER — ADULT MULTIVITAMIN LIQUID CH: 5.0000 mL | Freq: Every day | ORAL | Status: DC

## 2011-10-12 NOTE — Anesthesia Postprocedure Evaluation (Signed)
  Anesthesia Post-op Note  Patient: [name]  Procedure(s) Performed: Procedure(s) (LRB): TRACHEOSTOMY (N/A)  Patient Location: ICU  Anesthesia Type: General  Level of Consciousness: sedated  Airway and Oxygen Therapy: Patient placed on Ventilator (see vital sign flow sheet for setting)  Post-op Pain: none  Post-op Assessment: Post-op Vital signs reviewed, Patient's Cardiovascular Status Stable and Respiratory Function Stable  Post-op Vital Signs: Reviewed and stable  Complications: No apparent anesthesia complications

## 2011-10-12 NOTE — Progress Notes (Signed)
Transfer note:  Report called to Ronita Hipps, RN  At [facility]; patient's mother notified of transfer; pt transferred via bed with O2/ventilator to [facility]  Room 5703.  Burnard Bunting, RN

## 2011-10-12 NOTE — Progress Notes (Signed)
CRITICAL VALUE ALERT  Critical value received:  HGB 6.7  Date of notification:  10/12/2011   Time of notification:  05:45  Critical value read back:yes  Nurse who received alert:  Carlyon Prows  MD notified (1st page):  Dr. Frederico Hamman  Time of first page:  05:45  MD notified (2nd page):  Time of second page:  Responding MD:  Dr. Frederico Hamman  Time MD responded:  05:45

## 2011-10-12 NOTE — Anesthesia Postprocedure Evaluation (Signed)
  Anesthesia Post-op Note  Patient: [name]  Procedure(s) Performed: Procedure(s) (LRB): TRACHEOSTOMY (N/A)  Patient Location: SICU  Anesthesia Type: General  Level of Consciousness: sedated  Airway and Oxygen Therapy: Patient connected to tracheostomy mask oxygen  Post-op Pain: mild  Post-op Assessment: Post-op Vital signs reviewed  Post-op Vital Signs: stable  Complications: No apparent anesthesia complications

## 2011-10-12 NOTE — Progress Notes (Signed)
Hgb 6.7   Transfuse 1 U PRBC

## 2011-10-12 NOTE — Discharge Summary (Signed)
Physician Discharge Summary  Patient ID: [name] MRN: [id number] DOB/AGE: 1971-02-15 41 y.o.  Admit date: 09/24/2011 Discharge date: 10/12/2011    Discharge Diagnoses:  Principal Problem:  *Empyema of left pleural space Active Problems:  Shortness of breath  PNA (pneumonia)  Leukocytosis  Tachycardia  Transaminitis  Hyperglycemia  Pleural effusion  Acute respiratory failure with hypoxia    Brief Summary: [name] is a 41 y.o. y/o female with a PMH of [name] presented 7/28 with progressive SOB, cough, fevers, nausea.  She was initially admitted by [facility] with PNA and large pleural effusion.  She decompensated despite attempts at thoracentesis and required intubation and tx to [facility] service.  She was seen in consultation by CVTS r/t concerns of loculated effusion and underwent L VATS by Dr. Cornelius Moras on 7/28.  She had trial of extubation 7/29 and required reintubation 7/30 r/t hypoxia and resp distress.   She failed further attempts at wean, complicated by delirium and underwent trach placement by ENT 8/14.  She is now ready for d/c to [facility] for further weaning attempts.   Acute VDRF -- r/t severe CAP, ARDS, empyema.  Not tol wean thus far.  S/p trach 8/14.  C/b delirium.  Cont wean attempts on [facility].  Also likely underlying OSA.   CAP -- cultures remain negative.  Does cont to have persistent infiltrates felt likely r/t inflammation/residual ARDS rather than infectious process.   Empyema -- as above.  Underwent VATS 7/28. Cont abx as below - vanc/zosyn  Persistent fevers - improved after CVL change 8/5.  Cont Abx as below.   Delirium -- improved post trach placement but remains easily agitated.  Cont gtts and wean off as able.  Cont namenda, prozac and buspirone home meds as well.    Consults:  ENT CVTS  Lines/Tubes: ETT: 7/28 >>> 7/29, 7/30 >>>8/14, TRACH [name]) 8/14>>  Lines: Rt IJ CVL 7/28>>8/05, Lt PICC 8/05>>  Cultures:  7/28 Blood >>Diphtheroids  7/28  Pleural fluid >>negative  7/28 Sputum>>negative  8/04 Sputum>>Candida  8/05 Urine>>negative   Antibiotics:  Rocephin (CAP) 7/28 >>> 8/4  Azithromycin 7/28 >>> 8/1  Vanc (Empyema) 7/28 >>> 8/4  Zosyn 8/4 >>>8/07  Cipro 8/4 >>>8/07  Zyvox 8/4 >>>8/07  Events Since Admission:  7/28 Failed thoracentesis due to loculations, decrtication for empyema, reintubated in PACU  7/28-- L VATS per Dr. Cornelius Moras with drainage of empyema  7/29 Extubated  7/30 Reintubated, hypoxia, distress  7/31 Removal chest tube  8/4 Increased oxygen requirements / WBC; ARDS vs edema; ARDS protocol, ABx changes, Lasix trial  8/9 Decreased tolerance to sbt, agitation  8/10 Sedated, comfortable on vent, no acute distress  8/11 Trial SBT with Mother at bedside, on low dose fentanyl -  8/12 and 8/13: Did well on SBT but with fent gtt on board. OFf fent gtt -> RASS +4 and vent dysnchrony. Also, cxr worse with pulmonary edema chf pattern   Significant Diagnostic Studies:  2D echo 8/12>>> EF 60-65%, ess normal    Filed Vitals:   10/12/11 0900 10/12/11 0930 10/12/11 1000 10/12/11 1107  BP: 93/49 93/50 89/51  93/48  Pulse: 92 95 88 104  Temp:  99.7 F (37.6 C)    TempSrc:  Oral    Resp: 17 20 13 19   Height:      Weight:      SpO2: 96%  98% 95%     Discharge Labs  BMET  Lab 10/12/11 10/11/11 0525 10/10/11 0503 10/09/11 0413 10/08/11 0500  NA 145 147* 144 138 143  K 3.8 4.1 -- -- --  CL 104 105 109 100 102  CO2 35* 37* 30 33* 38*  GLUCOSE 141* 113* 117* 110* 124*  BUN 29* 30* 27* 21 23  CREATININE 0.84 0.88 0.81 0.77 0.85  CALCIUM 8.1* 8.3* 6.3* 7.4* 8.1*  MG -- 2.3 -- 1.9 2.3  PHOS -- 3.8 -- -- --     CBC   Lab 10/12/11 0415 10/11/11 0525 10/09/11 0413  HGB 6.7* 7.9* 7.5*  HCT 22.8* 26.4* 24.1*  WBC 9.4 11.4* 9.8  PLT 246 279 138*   Anti-Coagulation  Lab 10/11/11 0525  INR 1.32      Discharge Orders    Future Appointments: Provider: Department: Dept Phone: Center:   11/09/2011 9:30 AM  Eustaquio Boyden, MD [facility] [phone] [facility]        [name]  Home Medication Instructions [id number]   Printed on:10/12/11 1110  Medication Information                    ipratropium (ATROVENT) 0.02 % nebulizer solution Take 2.5 mLs (0.5 mg total) by nebulization every 6 (six) hours as needed.           insulin aspart (NOVOLOG) 100 UNIT/ML injection Inject 0-24 Units into the skin every 4 (four) hours.           acetaminophen (TYLENOL) 160 MG/5ML solution Take 20.3 mLs (650 mg total) by mouth every 6 (six) hours as needed.           feeding supplement (PRO-STAT SUGAR FREE 64) LIQD Place 30 mLs into feeding tube 4 (four) times daily.           Multiple Vitamin (MULTIVITAMIN) LIQD Place 5 mLs into feeding tube daily.           pantoprazole sodium (PROTONIX) 40 mg/20 mL PACK Place 20 mLs (40 mg total) into feeding tube daily at 12 noon.           levothyroxine (SYNTHROID, LEVOTHROID) 112 MCG tablet Take 1 tablet (112 mcg total) by mouth daily before breakfast.           sodium chloride 0.9 % SOLN 200 mL with fentaNYL 0.05 MG/ML SOLN 2,500 mcg Inject 50-400 mcg/hr into the vein continuous.           sodium chloride 0.9 % SOLN 40 mL with midazolam 5 MG/ML SOLN 50 mg Inject 2-10 mg/hr into the vein continuous.           FLUoxetine (PROZAC) 20 MG/5ML solution Place 5 mLs (20 mg total) into feeding tube daily.           bisacodyl (DULCOLAX) 5 MG EC tablet Take 2 tablets (10 mg total) by mouth daily as needed.           vancomycin (VANCOCIN) 1 GM/200ML SOLN Inject 200 mLs (1,000 mg total) into the vein every 12 (twelve) hours.           piperacillin-tazobactam (ZOSYN) 3-0.375 GM/50ML IVPB Inject 50 mLs (3.375 g total) into the vein every 8 (eight) hours.           memantine (NAMENDA) 10 MG tablet Take 1 tablet (10 mg total) by mouth 2 (two) times daily.           busPIRone (BUSPAR) 15 MG tablet Take 1 tablet (15 mg total) by mouth 2 (two)  times daily.  Nutritional Supplements (FEEDING SUPPLEMENT, OXEPA,) LIQD 63ml/hr continuous TF              Disposition: [facility]   Discharged Condition: [name] has met maximum benefit of inpatient care and is medically stable and cleared for discharge.  Patient is pending follow up as above.      Time spent on disposition:  Greater than 35 minutes.   SignedDanford Bad, NP 10/12/2011  11:10 AM Pager: [phone] or [phone]  *Care during the described time interval was provided by me and/or other providers on the critical care team. I have reviewed this patient's available data, including medical history, events of note, physical examination and test results as part of my evaluation.  > 35 min dc pllanning  Dr. Kalman Shan, M.D., [facility].C.P Pulmonary and Critical Care Medicine Staff Physician [facility] Pager: [phone], If no answer or between  15:00h - 7:00h: call [phone]  10/12/2011 12:22 PM

## 2011-10-13 ENCOUNTER — Encounter (HOSPITAL_COMMUNITY): Payer: Self-pay | Admitting: Otolaryngology

## 2011-10-13 LAB — COMPREHENSIVE METABOLIC PANEL
AST: 60 U/L — ABNORMAL HIGH (ref 0–37)
Albumin: 1.7 g/dL — ABNORMAL LOW (ref 3.5–5.2)
BUN: 26 mg/dL — ABNORMAL HIGH (ref 6–23)
Calcium: 8.4 mg/dL (ref 8.4–10.5)
Chloride: 104 mEq/L (ref 96–112)
Creatinine, Ser: 0.98 mg/dL (ref 0.50–1.10)
Total Bilirubin: 0.2 mg/dL — ABNORMAL LOW (ref 0.3–1.2)
Total Protein: 6.2 g/dL (ref 6.0–8.3)

## 2011-10-13 LAB — URINE CULTURE

## 2011-10-13 LAB — TYPE AND SCREEN
ABO/RH(D): A POS
Antibody Screen: NEGATIVE

## 2011-10-13 LAB — SEDIMENTATION RATE: Sed Rate: 130 mm/hr — ABNORMAL HIGH (ref 0–22)

## 2011-10-13 LAB — CBC
HCT: 29.7 % — ABNORMAL LOW (ref 36.0–46.0)
MCH: 31.9 pg (ref 26.0–34.0)
MCHC: 29.6 g/dL — ABNORMAL LOW (ref 30.0–36.0)
MCV: 107.6 fL — ABNORMAL HIGH (ref 78.0–100.0)
Platelets: 241 10*3/uL (ref 150–400)
RDW: 17.7 % — ABNORMAL HIGH (ref 11.5–15.5)
WBC: 8.2 10*3/uL (ref 4.0–10.5)

## 2011-10-13 LAB — C-REACTIVE PROTEIN: CRP: 17.4 mg/dL — ABNORMAL HIGH (ref <0.60)

## 2011-10-14 LAB — VANCOMYCIN, TROUGH: Vancomycin Tr: 12.4 ug/mL (ref 10.0–20.0)

## 2011-10-15 LAB — COMPREHENSIVE METABOLIC PANEL
AST: 29 U/L (ref 0–37)
Albumin: 1.6 g/dL — ABNORMAL LOW (ref 3.5–5.2)
Alkaline Phosphatase: 64 U/L (ref 39–117)
BUN: 21 mg/dL (ref 6–23)
Chloride: 105 mEq/L (ref 96–112)
Potassium: 3.9 mEq/L (ref 3.5–5.1)
Total Bilirubin: 0.2 mg/dL — ABNORMAL LOW (ref 0.3–1.2)

## 2011-10-15 LAB — CBC
MCH: 33 pg (ref 26.0–34.0)
MCHC: 30.5 g/dL (ref 30.0–36.0)
MCV: 108 fL — ABNORMAL HIGH (ref 78.0–100.0)
Platelets: 261 10*3/uL (ref 150–400)
RDW: 16.8 % — ABNORMAL HIGH (ref 11.5–15.5)
WBC: 8.3 10*3/uL (ref 4.0–10.5)

## 2011-10-15 LAB — CULTURE, RESPIRATORY W GRAM STAIN

## 2011-10-16 ENCOUNTER — Other Ambulatory Visit (HOSPITAL_COMMUNITY): Payer: Self-pay

## 2011-10-16 LAB — CBC
MCH: 33.3 pg (ref 26.0–34.0)
MCHC: 30.5 g/dL (ref 30.0–36.0)
Platelets: 218 10*3/uL (ref 150–400)
RDW: 16.5 % — ABNORMAL HIGH (ref 11.5–15.5)

## 2011-10-16 LAB — HEPATIC FUNCTION PANEL
Albumin: 1.6 g/dL — ABNORMAL LOW (ref 3.5–5.2)
Total Bilirubin: 0.2 mg/dL — ABNORMAL LOW (ref 0.3–1.2)
Total Protein: 6.1 g/dL (ref 6.0–8.3)

## 2011-10-16 LAB — BASIC METABOLIC PANEL
Calcium: 8.5 mg/dL (ref 8.4–10.5)
GFR calc non Af Amer: 88 mL/min — ABNORMAL LOW (ref >90)
Sodium: 143 mEq/L (ref 135–145)

## 2011-10-17 ENCOUNTER — Other Ambulatory Visit (HOSPITAL_COMMUNITY): Payer: Self-pay

## 2011-10-17 DIAGNOSIS — J9600 Acute respiratory failure, unspecified whether with hypoxia or hypercapnia: Secondary | ICD-10-CM

## 2011-10-17 DIAGNOSIS — J189 Pneumonia, unspecified organism: Secondary | ICD-10-CM

## 2011-10-17 LAB — BLOOD GAS, ARTERIAL
Acid-Base Excess: 9.2 mmol/L — ABNORMAL HIGH (ref 0.0–2.0)
Acid-Base Excess: 9.5 mmol/L — ABNORMAL HIGH (ref 0.0–2.0)
FIO2: 0.5 %
FIO2: 0.5 %
O2 Saturation: 93.8 %
O2 Saturation: 97.5 %
PEEP: 10 cmH2O
Patient temperature: 98.6
Pressure control: 20 cmH2O
RATE: 12 resp/min
RATE: 20 resp/min
TCO2: 35.2 mmol/L (ref 0–100)
pO2, Arterial: 62.4 mmHg — ABNORMAL LOW (ref 80.0–100.0)
pO2, Arterial: 80.1 mmHg (ref 80.0–100.0)

## 2011-10-17 LAB — VANCOMYCIN, TROUGH: Vancomycin Tr: 28 ug/mL (ref 10.0–20.0)

## 2011-10-17 LAB — BASIC METABOLIC PANEL
CO2: 37 mEq/L — ABNORMAL HIGH (ref 19–32)
GFR calc non Af Amer: 78 mL/min — ABNORMAL LOW (ref >90)
Glucose, Bld: 136 mg/dL — ABNORMAL HIGH (ref 70–99)
Potassium: 3.8 mEq/L (ref 3.5–5.1)
Sodium: 146 mEq/L — ABNORMAL HIGH (ref 135–145)

## 2011-10-17 LAB — CULTURE, BLOOD (ROUTINE X 2): Culture: NO GROWTH

## 2011-10-17 NOTE — Consult Note (Signed)
Name: [name] MRN: [id number] DOB: [birth year]    LOS: 5  Referring Provider:  Stanton Kidney [facility]  Reason for Referral:  Postop respiratory failure    PULMONARY / CRITICAL CARE MEDICINE     Consult   Brief patient description:  is a 40 y.o. y/o female with a PMH of [name] presented 7/28 with progressive SOB, cough, fevers, nausea. She was initially admitted by [facility] with PNA and large pleural effusion. She decompensated despite attempts at thoracentesis and required intubation and tx to [facility] service. She was seen in consultation by CVTS r/t concerns of loculated effusion and underwent L VATS by Dr. Cornelius Moras on 7/28. She had trial of extubation 7/29 and required reintubation 7/30 r/t hypoxia and resp distress. She failed further attempts at wean, complicated by delirium and underwent trach placement by ENT 8/14. She was tfr to [facility] 8/15  LTAC for further weaning attempts.  PCCM called for consult AM 8/20 as pt became agitated on vent and not in sync with vent.    DEVICES ETT:  7/28 >>> 7/29, 7/30 >>>8/14, TRACH [name] 8/14>> Lines: Rt IJ CVL 7/28>>8/05, Lt PICC 8/05>>     Events Since Admission: 7/28  Failed thoracentesis due to loculations, decrtication for empyema, reintubated in PACU 7/29  Extubated 7/30  Reintubated, hypoxia, distress 7/31  Removal chest tube 8/4    Increased oxygen requirements / WBC; ARDS vs edema; ARDS protocol, ABx changes, Lasix trial 8/9    Decreased tolerance to sbt, agitation 8/10  Sedated, comfortable on vent, no acute distress 8/11  Trial SBT with Mother at bedside, on low dose fentanyl - 8/12 and 8/13: Did well on SBT but with fent gtt on board. OFf fent gtt -> RASS +4 and vent dysnchrony. Also, cxr worse with pulmonary edema chf pattern 8/15 Tfr to LTAC 8/20 breath stacking on vent, needed more sedation and change to PCV mode    SUBJECTIVE/OVERNIGHT/INTERVAL HX Pt agitated on vent. Called to see this pt. Required more  sedation and paralytics and change to PCV mode   Vital Signs:    Physical Examination: General:  Bucking vent  Neuro: RASS -2  on fent gtt HEENT: ETT, OG in place, enlarged tongue,  Neck: trach in place, no drainage Cardiovascular:  s1s2 regular Lungs:  Decreased breath sounds, scattered rhonchi, expired wheeze Abdomen:  Soft nontender no organomegaly Musculoskeletal:  ankle edema Skin:  Intact  Dg Chest Port 1 View  10/17/2011  *RADIOLOGY REPORT*  Clinical Data: Restoration failure.  PORTABLE CHEST - 1 VIEW  Comparison: 10/16/2011 at 06:29 a.m.  Findings: There is diffuse bilateral air space disease with slight improvement in aeration of the right lung base.  The tracheostomy tube and nasogastric tube remain in satisfactory position.  The left subclavian central venous catheter tip is directed into the region of the right innominate vein and is unchanged in position. There is no pneumothorax.  IMPRESSION: Diffuse bilateral air space disease with slight improvement in aeration of the right lung base.   Original Report Authenticated By: Rolla Plate, M.D.    Dg Chest Port 1 View  10/16/2011  *RADIOLOGY REPORT*  Clinical Data: Respiratory failure  PORTABLE CHEST - 1 VIEW  Comparison: 10/12/2011  Findings: Stable tracheostomy tube position.  Stable NG tube position.  There is a left arm PICC line with tip going cephalad in the right innominate vein.  No diagnostic pneumothorax. There is worsening in aeration with bilateral hazy airspace disease and interstitial prominence probable due to  pulmonary edema. Superimposed infiltrates cannot be excluded.  IMPRESSION:   Stable NG tube position.  There is a left arm PICC line with tip going cephalad in the right innominate vein.  No diagnostic pneumothorax. There is worsening in aeration with bilateral hazy airspace disease and interstitial prominence probable due to pulmonary edema.  Superimposed infiltrates cannot be excluded.   Original Report  Authenticated By: Natasha Mead, M.D. ( 10/16/2011 07:51:34 )     ASSESSMENT AND PLAN  PULMONARY  Lab 10/17/11 1045  PHART 7.444  PCO2ART 49.9*  PO2ART 62.4*  HCO3 33.7*  O2SAT 93.8   Ventilator Settings:  Now on PCV 20 Peep 10 Fio2 .50 Rate 20  With Vt 410     A:   Empyema s/p decortication.   Pneumonia.   Acute respiratory failure. ARDS       P:   Change to PCV mode Give one dose paralytic Diurese further  Increase sedation No further wean today Peep to 10    CARDIOVASCULAR  No results found for this basename: TROPONINI:5 in the last 168 hours ,  Lab 10/11/11 0525  PROBNP 486.8*     A: No evident CHF On 10/10/11: echo normal.   P:  - diurese as tolerated   RENAL  Lab 10/17/11 0550 10/16/11 0615 10/15/11 0605 10/13/11 0647 10/12/11 10/11/11 0525  NA 146* 143 145 146* 145 --  K 3.8 3.9 -- -- -- --  CL 104 104 105 104 104 --  CO2 37* 33* 35* 37* 35* --  BUN 20 21 21  26* 29* --  CREATININE 0.90 0.82 0.80 0.98 0.84 --  CALCIUM 8.7 8.5 8.5 8.4 8.1* --  MG -- -- 2.2 -- -- 2.3  PHOS -- -- -- -- -- 3.8   Intake/Output    None    A:   No renal issues  P:   monitor  GASTROINTESTINAL  Lab 10/16/11 0615 10/15/11 0605 10/13/11 0647  AST 23 29 60*  ALT 78* 106* 237*  ALKPHOS 60 64 65  BILITOT 0.2* 0.2* 0.2*  PROT 6.1 6.4 6.2  ALBUMIN 1.6* 1.6* 1.7*   Abd u/s 8/05>>non-specific slight echogenic periportal structures.   Acute hepatitis panel negative from 8/06.  A: borderline LFTs P:   Continue tube feeds   HEMATOLOGIC  Lab 10/16/11 0615 10/15/11 0605 10/13/11 0647 10/12/11 1130 10/12/11 0415 10/11/11 0525  HGB 8.4* 8.6* 8.8* 9.4* 6.7* --  HCT 27.5* 28.2* 29.7* 30.4* 22.8* --  PLT 218 261 241 232 246 --  INR -- -- -- -- -- 1.32  APTT -- -- -- -- -- --   A:   Mild anemia of critical illness, stable.  P:  F/u CBC intermittently Transfuse for Hb < 7  INFECTIOUS  Lab 10/16/11 0615 10/15/11 0605 10/13/11 0647 10/12/11 1130 10/12/11  0415  WBC 7.4 8.3 8.2 11.3* 9.4  PROCALCITON -- -- -- -- 0.16   Cultures: 7/28  Blood >>Diphtheroids 7/28  Pleural fluid >>negative 7/28 Sputum>>negative 8/04 Sputum>>Candida 8/05 Urine>>negative 8/15 resp c/s >>>enterococcus 8/14 urine >100K yeast 8/14 BCx2>>>neg  Antibiotics: Rocephin (CAP) 7/28 >>> 8/4 Azithromycin  7/28 >>> 8/1 Vanc (Empyema) 7/28 >>> 8/4 Zosyn 8/4 >>>8/07 Cipro 8/4 >>>8/07 Zyvox 8/4 >>>8/07 8/15 zosyn>> 8/15 vanco>> 8/15 diflucan (yeast in urine/skin)  A:   Community acquired pneumonia with empyema, remains culture negative.  Has persistent fever>>?VAP vs Line.  Fever better after CVL change 8/05.   Candidal rash around neck 8/08 and yeast in urine   -  on 10/11/11: New fever 103F and rising wBC  P:   Cont zosyn/vanco Add diflucan ?ID consultation  ENDOCRINE  Lab 10/12/11 1108 10/12/11 0823 10/12/11 0353 10/12/11 0019 10/11/11 2020  GLUCAP 134* 127* 120* 136* 132*   A:   Hyperglycemia.   Hx of hypothyroidism.  P:   SSI/CBG Synthroid  NEUROLOGIC  A:   Mild dementia due to Down syndrome.   Acute encephalopathy due to sedation.   P:   Cont sedative drips      The patient is critically ill with multiple organ systems failure and requires high complexity decision making for assessment and support, frequent evaluation and titration of therapies, application of advanced monitoring technologies and extensive interpretation of multiple databases.   Critical Care Time devoted to patient care services described in this note is 40   Minutes.  Shan Levans Beeper  [phone]  Cell  [phone]  If no response or cell goes to voicemail, call beeper [phone] 10/17/2011 11:14 AM

## 2011-10-18 ENCOUNTER — Other Ambulatory Visit (HOSPITAL_COMMUNITY): Payer: Self-pay

## 2011-10-18 LAB — URINE MICROSCOPIC-ADD ON

## 2011-10-18 LAB — URINALYSIS, ROUTINE W REFLEX MICROSCOPIC
Bilirubin Urine: NEGATIVE
Ketones, ur: NEGATIVE mg/dL
Nitrite: NEGATIVE
Specific Gravity, Urine: 1.012 (ref 1.005–1.030)
Urobilinogen, UA: 0.2 mg/dL (ref 0.0–1.0)

## 2011-10-18 LAB — BASIC METABOLIC PANEL
BUN: 31 mg/dL — ABNORMAL HIGH (ref 6–23)
Calcium: 8.5 mg/dL (ref 8.4–10.5)
Creatinine, Ser: 1.89 mg/dL — ABNORMAL HIGH (ref 0.50–1.10)
GFR calc Af Amer: 37 mL/min — ABNORMAL LOW (ref >90)
GFR calc non Af Amer: 32 mL/min — ABNORMAL LOW (ref >90)
Glucose, Bld: 144 mg/dL — ABNORMAL HIGH (ref 70–99)

## 2011-10-18 NOTE — Progress Notes (Signed)
Name: [name] MRN: [id number] DOB: Dec 17, 1970    LOS: 6  Referring Provider:  Stanton Kidney [facility]  Reason for Referral:  Postop respiratory failure    PULMONARY / CRITICAL CARE MEDICINE     Consult   Brief patient description:  is a 41 y.o. y/o female with a PMH of [name] presented 7/28 with progressive SOB, cough, fevers, nausea. She was initially admitted by [facility] with PNA and large pleural effusion. She decompensated despite attempts at thoracentesis and required intubation and tx to [facility] service. She was seen in consultation by CVTS r/t concerns of loculated effusion and underwent L VATS by Dr. Cornelius Moras on 7/28. She had trial of extubation 7/29 and required reintubation 7/30 r/t hypoxia and resp distress. She failed further attempts at wean, complicated by delirium and underwent trach placement by ENT 8/14. She was tfr to [facility] 8/15  LTAC for further weaning attempts.  PCCM called for consult AM 8/20 as pt became agitated on vent and not in sync with vent.    DEVICES ETT:  7/28 >>> 7/29, 7/30 >>>8/14, TRACH [name] 8/14>> Lines: Rt IJ CVL 7/28>>8/05, Lt PICC 8/05>>   Events Since Admission: 7/28  Failed thoracentesis due to loculations, decrtication for empyema, reintubated in PACU 7/29  Extubated 7/30  Reintubated, hypoxia, distress 7/31  Removal chest tube 8/4    Increased oxygen requirements / WBC; ARDS vs edema; ARDS protocol, ABx changes, Lasix trial 8/9    Decreased tolerance to sbt, agitation 8/10  Sedated, comfortable on vent, no acute distress 8/11  Trial SBT with Mother at bedside, on low dose fentanyl - 8/12 and 8/13: Did well on SBT but with fent gtt on board. OFf fent gtt -> RASS +4 and vent dysnchrony. Also, cxr worse with pulmonary edema chf pattern 8/15 Tfr to LTAC 8/20 breath stacking on vent, needed more sedation and change to PCV mode   SUBJECTIVE/OVERNIGHT/INTERVAL HX More comfortable on vent with mod sedation, remains on propofol.       Vital Signs:  Reviewed.   Physical Examination: General:  NAD on vent, sedated  Neuro: RASS -2  on propofol  HEENT: trach site c/d,  enlarged tongue,  Cardiovascular:  s1s2 regular Lungs:  Decreased breath sounds, scattered rhonchi, mild exp wheeze Abdomen:  Soft nontender no organomegaly Musculoskeletal:  ankle edema Skin:  Intact  Dg Chest Port 1 View  10/18/2011  *RADIOLOGY REPORT*  Clinical Data: Follow up CHF  PORTABLE CHEST - 1 VIEW  Comparison: 10/17/2011  Findings: Tracheostomy tube tip is above the carina. Left arm PICC line tip has in the projection of the SVC but is oriented cranially.  There is a feeding tube with tip below the field of view.  Heart size is stable.  No significant change in the left pleural effusion and moderate interstitial edema.  IMPRESSION:  1.  No significant change in CHF pattern. 2.  PICC line tip appears oriented cranially in the projection of the SVC.   Original Report Authenticated By: Rosealee Albee, M.D.    Dg Chest Port 1 View  10/17/2011  *RADIOLOGY REPORT*  Clinical Data: Restoration failure.  PORTABLE CHEST - 1 VIEW  Comparison: 10/16/2011 at 06:29 a.m.  Findings: There is diffuse bilateral air space disease with slight improvement in aeration of the right lung base.  The tracheostomy tube and nasogastric tube remain in satisfactory position.  The left subclavian central venous catheter tip is directed into the region of the right innominate vein and is  unchanged in position. There is no pneumothorax.  IMPRESSION: Diffuse bilateral air space disease with slight improvement in aeration of the right lung base.   Original Report Authenticated By: Rolla Plate, M.D.    Dg Abd Portable 1v  10/17/2011  *RADIOLOGY REPORT*  Clinical Data: 41 year old female feeding tube placement.  PORTABLE ABDOMEN - 1 VIEW  Comparison: 10/12/2011.  Findings: Semi upright AP portable view 1350 hours.  Enteric tube courses to the abdomen and across midline to the  right upper quadrant where the tip is angled down.  Stable bowel gas pattern, primarily with gas in nondilated colon.  Respiratory motion artifact in increased opacity at both lung bases.  IMPRESSION: Feeding tube tip in the region of the duodenal bulb.   Original Report Authenticated By: Harley Hallmark, M.D.     ASSESSMENT AND PLAN  PULMONARY  Lab 10/17/11 1215 10/17/11 1045  PHART 7.439 7.444  PCO2ART 51.1* 49.9*  PO2ART 80.1 62.4*  HCO3 34.1* 33.7*  O2SAT 97.5 93.8    A:   Empyema s/p decortication.   Pneumonia.   Acute respiratory failure. ARDS     P:   Cont PCV mode Diurese as tol >>increase to 40mg  q8H Cont sedation gtts, titrate as able - may need to try precedex - worked well for pt in 2100 Peep to 5 F/u CXR     HEMATOLOGIC  Lab 10/16/11 0615 10/15/11 0605 10/13/11 0647 10/12/11 1130 10/12/11 0415  HGB 8.4* 8.6* 8.8* 9.4* 6.7*  HCT 27.5* 28.2* 29.7* 30.4* 22.8*  PLT 218 261 241 232 246  INR -- -- -- -- --  APTT -- -- -- -- --   A:   Mild anemia of critical illness, stable.  P:  F/u CBC intermittently Transfuse for Hb < 7  INFECTIOUS  Lab 10/16/11 0615 10/15/11 0605 10/13/11 0647 10/12/11 1130 10/12/11 0415  WBC 7.4 8.3 8.2 11.3* 9.4  PROCALCITON -- -- -- -- 0.16   Cultures: 7/28  Blood >>Diphtheroids 7/28  Pleural fluid >>negative 7/28 Sputum>>negative 8/04 Sputum>>Candida 8/05 Urine>>negative 8/15 resp c/s >>>enterococcus 8/14 urine >100K yeast 8/14 BCx2>>>neg  Antibiotics: Rocephin (CAP) 7/28 >>> 8/4 Azithromycin  7/28 >>> 8/1 Vanc (Empyema) 7/28 >>> 8/4 Zosyn 8/4 >>>8/07 Cipro 8/4 >>>8/07 Zyvox 8/4 >>>8/07 8/15 zosyn>> 8/15 vanco>> 8/15 diflucan (yeast in urine/skin)  A:   Community acquired pneumonia with empyema, remains culture negative.  Has persistent fever>>?VAP vs Line.  Fever better after CVL change 8/05.   Candidal rash around neck 8/08 and yeast in urine  P:   Cont zosyn/vanco per primary  Cont diflucan ?ID  consultation - defer to primary   NEUROLOGIC  A:   Mild dementia due to Down syndrome.   Acute encephalopathy due to sedation.   P:   Cont sedative drips Consider trial precedex  Need to improve mental status to allow for weaning trials.    Danford Bad, NP 10/18/2011  9:12 AM Pager: [phone] or [phone]  *Care during the described time interval was provided by me and/or other providers on the critical care team. I have reviewed this patient's available data, including medical history, events of note, physical examination and test results as part of my evaluation.         I have seen and examined this pt and agree with above CC  Shan Levans Beeper  [phone]  Cell  [phone]  If no response or cell goes to voicemail, call beeper [phone]

## 2011-10-19 ENCOUNTER — Other Ambulatory Visit (HOSPITAL_COMMUNITY): Payer: Self-pay

## 2011-10-19 LAB — CBC WITH DIFFERENTIAL/PLATELET
Basophils Absolute: 0.1 10*3/uL (ref 0.0–0.1)
Eosinophils Absolute: 0.5 10*3/uL (ref 0.0–0.7)
Lymphocytes Relative: 17 % (ref 12–46)
MCHC: 30.8 g/dL (ref 30.0–36.0)
Monocytes Relative: 8 % (ref 3–12)
Neutrophils Relative %: 66 % (ref 43–77)
RDW: 16.6 % — ABNORMAL HIGH (ref 11.5–15.5)
WBC: 6.8 10*3/uL (ref 4.0–10.5)

## 2011-10-19 LAB — BASIC METABOLIC PANEL
CO2: 42 mEq/L (ref 19–32)
Calcium: 9.2 mg/dL (ref 8.4–10.5)
Chloride: 97 mEq/L (ref 96–112)
Creatinine, Ser: 1.4 mg/dL — ABNORMAL HIGH (ref 0.50–1.10)
GFR calc Af Amer: 53 mL/min — ABNORMAL LOW (ref >90)
Sodium: 146 mEq/L — ABNORMAL HIGH (ref 135–145)

## 2011-10-19 LAB — VANCOMYCIN, TROUGH: Vancomycin Tr: 12 ug/mL (ref 10.0–20.0)

## 2011-10-19 LAB — MAGNESIUM: Magnesium: 1.8 mg/dL (ref 1.5–2.5)

## 2011-10-19 LAB — POTASSIUM: Potassium: 2.9 mEq/L — ABNORMAL LOW (ref 3.5–5.1)

## 2011-10-19 NOTE — Progress Notes (Signed)
Name: [name] MRN: [id number] DOB: December 01, 1970    LOS: 7  Referring Provider:  Stanton Kidney [facility]  Reason for Referral:  Postop respiratory failure    PULMONARY / CRITICAL CARE MEDICINE     Consult f/u   Brief patient description:  is a 41 y.o. y/o female with a PMH of [name] presented 7/28 with progressive SOB, cough, fevers, nausea. She was initially admitted by [facility] with PNA and large pleural effusion. She decompensated despite attempts at thoracentesis and required intubation and tx to [facility] service. She was seen in consultation by CVTS r/t concerns of loculated effusion and underwent L VATS by Dr. Cornelius Moras on 7/28. She had trial of extubation 7/29 and required reintubation 7/30 r/t hypoxia and resp distress. She failed further attempts at wean, complicated by delirium and underwent trach placement by ENT 8/14. She was tfr to [facility] 8/15  LTAC for further weaning attempts.  PCCM called for consult AM 8/20 as pt became agitated on vent and not in sync with vent.    DEVICES ETT:  7/28 >>> 7/29, 7/30 >>>8/14, TRACH [name] 8/14>> Lines: Rt IJ CVL 7/28>>8/05, Lt PICC 8/05>>   Events Since Admission: 7/28  Failed thoracentesis due to loculations, decrtication for empyema, reintubated in PACU 7/29  Extubated 7/30  Reintubated, hypoxia, distress 7/31  Removal chest tube 8/4    Increased oxygen requirements / WBC; ARDS vs edema; ARDS protocol, ABx changes, Lasix trial 8/9    Decreased tolerance to sbt, agitation 8/10  Sedated, comfortable on vent, no acute distress 8/11  Trial SBT with Mother at bedside, on low dose fentanyl - 8/12 and 8/13: Did well on SBT but with fent gtt on board. OFf fent gtt -> RASS +4 and vent dysnchrony. Also, cxr worse with pulmonary edema chf pattern 8/15 Tfr to LTAC 8/20 breath stacking on vent, needed more sedation and change to PCV mode   SUBJECTIVE/OVERNIGHT/INTERVAL HX Much improved with diuresis.  I/O  - 6450  Vital Signs:  Reviewed.   Physical Examination: General:  NAD on vent, sedated  Neuro: RASS -2  on propofol  HEENT: trach site c/d,  enlarged tongue,  Cardiovascular:  s1s2 regular Lungs:  Decreased breath sounds, scattered rhonchi, mild exp wheeze Abdomen:  Soft nontender no organomegaly Musculoskeletal:  ankle edema Skin:  Intact  Dg Chest Port 1 View  10/19/2011  *RADIOLOGY REPORT*  Clinical Data: Ventilator, respiratory failure  PORTABLE CHEST - 1 VIEW  Comparison: Portable exam 0624 hours compared to 10/18/2011  Findings: Tracheostomy tube and feeding tube unchanged. Minimal enlargement of cardiac silhouette. Bilateral interstitial infiltrates, slightly improved. Suspect coexistent atelectasis and effusion left base. No pneumothorax. Bones unremarkable.  IMPRESSION: Improving pulmonary infiltrates question edema. Suspect atelectasis and effusion left lung base.   Original Report Authenticated By: Lollie Marrow, M.D.    Dg Chest Port 1 View  10/18/2011  *RADIOLOGY REPORT*  Clinical Data: Follow up CHF  PORTABLE CHEST - 1 VIEW  Comparison: 10/17/2011  Findings: Tracheostomy tube tip is above the carina. Left arm PICC line tip has in the projection of the SVC but is oriented cranially.  There is a feeding tube with tip below the field of view.  Heart size is stable.  No significant change in the left pleural effusion and moderate interstitial edema.  IMPRESSION:  1.  No significant change in CHF pattern. 2.  PICC line tip appears oriented cranially in the projection of the SVC.   Original Report Authenticated By: Simonne Martinet.  Bradly [name], M.D.    Dg Abd Portable 1v  10/17/2011  *RADIOLOGY REPORT*  Clinical Data: 42 year old female feeding tube placement.  PORTABLE ABDOMEN - 1 VIEW  Comparison: 10/12/2011.  Findings: Semi upright AP portable view 1350 hours.  Enteric tube courses to the abdomen and across midline to the right upper quadrant where the tip is angled down.  Stable bowel gas pattern, primarily with gas in  nondilated colon.  Respiratory motion artifact in increased opacity at both lung bases.  IMPRESSION: Feeding tube tip in the region of the duodenal bulb.   Original Report Authenticated By: Harley Hallmark, M.D.     ASSESSMENT AND PLAN  PULMONARY  Lab 10/17/11 1215 10/17/11 1045  PHART 7.439 7.444  PCO2ART 51.1* 49.9*  PO2ART 80.1 62.4*  HCO3 34.1* 33.7*  O2SAT 97.5 93.8    A:   Empyema s/p decortication On Left  Pneumonia.  Left sided NOS Acute respiratory failure. ARDS  VOlume excess     P:   Change back to VC mode rate 16  Peep 5 fio2 .40  Vt 350 = 8cc/kg Diurese as tol >>decrease  to 40mg  q12H Cont sedation gtts, titrate as able - may need to try precedex - worked well for pt in 2100 Peep to 5 F/u CXR  Ok to resume weaning     HEMATOLOGIC  Lab 10/19/11 0529 10/16/11 0615 10/15/11 0605 10/13/11 0647  HGB 8.9* 8.4* 8.6* 8.8*  HCT 28.9* 27.5* 28.2* 29.7*  PLT 234 218 261 241  INR -- -- -- --  APTT -- -- -- --   A:   Mild anemia of critical illness, stable.  P:  F/u CBC intermittently Transfuse for Hb < 7  INFECTIOUS  Lab 10/19/11 0529 10/16/11 0615 10/15/11 0605 10/13/11 0647  WBC 6.8 7.4 8.3 8.2  PROCALCITON -- -- -- --   Cultures: 7/28  Blood >>Diphtheroids 7/28  Pleural fluid >>negative 7/28 Sputum>>negative 8/04 Sputum>>Candida 8/05 Urine>>negative 8/15 resp c/s >>>enterococcus 8/14 urine >100K yeast 8/14 BCx2>>>neg  Antibiotics: Rocephin (CAP) 7/28 >>> 8/4 Azithromycin  7/28 >>> 8/1 Vanc (Empyema) 7/28 >>> 8/4 Zosyn 8/4 >>>8/07 Cipro 8/4 >>>8/07 Zyvox 8/4 >>>8/07 8/15 zosyn>> 8/15 vanco>> 8/15 diflucan (yeast in urine/skin)  A:   Community acquired pneumonia with empyema, remains culture negative.  Has persistent fever>>?VAP vs Line.  Fever better after CVL change 8/05.   Candidal rash around neck 8/08 and yeast in urine  P:   Cont zosyn/vanco per primary  Cont diflucan  NEUROLOGIC  A:   Mild dementia due to Down syndrome.     Acute encephalopathy due to sedation.   P:   Cont sedative drips, ok to wean down propofol to off          CC  Shan Levans Beeper  [phone]  Cell  [phone]  If no response or cell goes to voicemail, call beeper [phone]

## 2011-10-20 DIAGNOSIS — J90 Pleural effusion, not elsewhere classified: Secondary | ICD-10-CM

## 2011-10-20 LAB — BASIC METABOLIC PANEL
Calcium: 8.9 mg/dL (ref 8.4–10.5)
Creatinine, Ser: 1.29 mg/dL — ABNORMAL HIGH (ref 0.50–1.10)
GFR calc Af Amer: 59 mL/min — ABNORMAL LOW (ref >90)
GFR calc non Af Amer: 51 mL/min — ABNORMAL LOW (ref >90)
Sodium: 144 mEq/L (ref 135–145)

## 2011-10-20 LAB — CLOSTRIDIUM DIFFICILE BY PCR: Toxigenic C. Difficile by PCR: NEGATIVE

## 2011-10-20 LAB — MAGNESIUM: Magnesium: 1.9 mg/dL (ref 1.5–2.5)

## 2011-10-20 NOTE — Progress Notes (Signed)
Name: [name] MRN: [id number] DOB: [birth year]    LOS: 8  Referring Provider:  Stanton Kidney [facility]  Reason for Referral:  Postop respiratory failure    PULMONARY / CRITICAL CARE MEDICINE     Consult f/u   Brief patient description:  is a 41 y.o. y/o female with a PMH of [name] presented 7/28 with progressive SOB, cough, fevers, nausea. She was initially admitted by [facility] with PNA and large pleural effusion. She decompensated despite attempts at thoracentesis and required intubation and tx to [facility] service. She was seen in consultation by CVTS r/t concerns of loculated effusion and underwent L VATS by Dr. Cornelius Moras on 7/28. She had trial of extubation 7/29 and required reintubation 7/30 r/t hypoxia and resp distress. She failed further attempts at wean, complicated by delirium and underwent trach placement by ENT 8/14. She was tfr to [facility] 8/15  LTAC for further weaning attempts.  PCCM called for consult AM 8/20 as pt became agitated on vent and not in sync with vent.    DEVICES ETT:  7/28 >>> 7/29, 7/30 >>>8/14, TRACH [name] 8/14>> Lines: Rt IJ CVL 7/28>>8/05, Lt PICC 8/05>>   Events Since Admission: 7/28  Failed thoracentesis due to loculations, decrtication for empyema, reintubated in PACU 7/29  Extubated 7/30  Reintubated, hypoxia, distress 7/31  Removal chest tube 8/4    Increased oxygen requirements / WBC; ARDS vs edema; ARDS protocol, ABx changes, Lasix trial 8/9    Decreased tolerance to sbt, agitation 8/10  Sedated, comfortable on vent, no acute distress 8/11  Trial SBT with Mother at bedside, on low dose fentanyl - 8/12 and 8/13: Did well on SBT but with fent gtt on board. OFf fent gtt -> RASS +4 and vent dysnchrony. Also, cxr worse with pulmonary edema chf pattern 8/15 Tfr to LTAC 8/20 breath stacking on vent, needed more sedation and change to PCV mode   SUBJECTIVE/OVERNIGHT/INTERVAL HX Weaning. More awake. No distress.   Vital Signs:  Reviewed.  sats 99% on 40%  Physical Examination: General:  NAD on vent, still on precedex but awake and f/c Neuro: RASS -2  on propofol  HEENT: trach site c/d,  enlarged tongue,  Cardiovascular:  s1s2 regular Lungs:  Decreased breath sounds, scattered rhonchi, no wheeze. Vt 390s on PSV of 16 Abdomen:  Soft nontender no organomegaly Musculoskeletal:  ankle edema Skin:  Intact  Dg Chest Port 1 View  10/19/2011  *RADIOLOGY REPORT*  Clinical Data: Ventilator, respiratory failure  PORTABLE CHEST - 1 VIEW  Comparison: Portable exam 0624 hours compared to 10/18/2011  Findings: Tracheostomy tube and feeding tube unchanged. Minimal enlargement of cardiac silhouette. Bilateral interstitial infiltrates, slightly improved. Suspect coexistent atelectasis and effusion left base. No pneumothorax. Bones unremarkable.  IMPRESSION: Improving pulmonary infiltrates question edema. Suspect atelectasis and effusion left lung base.   Original Report Authenticated By: Lollie Marrow, M.D.   agree-->improved aeration when c/w prior film.   ASSESSMENT AND PLAN  PULMONARY  Lab 10/17/11 1215 10/17/11 1045  PHART 7.439 7.444  PCO2ART 51.1* 49.9*  PO2ART 80.1 62.4*  HCO3 34.1* 33.7*  O2SAT 97.5 93.8    A:   Empyema s/p decortication On Left  Pneumonia.  Left sided NOS Acute respiratory failure. ARDS  Volume excess    P:   Cont rest mode support at: rr 16  Peep 5 fio2 .40  Vt 350 = 8cc/kg Ok to start PSV cycle Diurese as tol >>decrease  to 40mg  per tube lasix q24H Transitioni diprivan  to   prn versed and fentaly with RASS goal 0, need to get off propofol If fails prn versed/fentanyl then try precedex     HEMATOLOGIC  Lab 10/19/11 0529 10/16/11 0615 10/15/11 0605  HGB 8.9* 8.4* 8.6*  HCT 28.9* 27.5* 28.2*  PLT 234 218 261  INR -- -- --  APTT -- -- --   A:   Mild anemia of critical illness, stable.  P:  F/u CBC intermittently Transfuse for Hb < 7  INFECTIOUS  Lab 10/19/11 0529 10/16/11 0615  10/15/11 0605  WBC 6.8 7.4 8.3  PROCALCITON -- -- --   Cultures: 7/28  Blood >>Diphtheroids 7/28  Pleural fluid >>negative 7/28 Sputum>>negative 8/04 Sputum>>Candida 8/05 Urine>>negative 8/15 resp c/s >>>enterococcus 8/14 urine >100K yeast 8/14 BCx2>>>neg  Antibiotics: Rocephin (CAP) 7/28 >>> 8/4 Azithromycin  7/28 >>> 8/1 Vanc (Empyema) 7/28 >>> 8/4 Zosyn 8/4 >>>8/07 Cipro 8/4 >>>8/07 Zyvox 8/4 >>>8/07 8/15 zosyn>> 8/15 vanco>> 8/15 diflucan (yeast in urine/skin)  A:   Community acquired pneumonia with empyema, remains culture negative.  Has persistent fever>>?VAP vs Line.  Fever better after CVL change 8/05.   Candidal rash around neck 8/08 and yeast in urine  P:   Cont zosyn/vanco per primary  Cont diflucan  NEUROLOGIC  A:   Mild dementia due to Down syndrome.   Acute encephalopathy due to sedation.  Seemingly slowly improving.   P:   See above    CC26min Shan Levans Beeper  [phone]  Cell  [phone]  If no response or cell goes to voicemail, call beeper [phone]

## 2011-10-21 ENCOUNTER — Other Ambulatory Visit (HOSPITAL_COMMUNITY): Payer: Self-pay

## 2011-10-21 LAB — CBC WITH DIFFERENTIAL/PLATELET
Eosinophils Relative: 2 % (ref 0–5)
HCT: 29.1 % — ABNORMAL LOW (ref 36.0–46.0)
Lymphocytes Relative: 21 % (ref 12–46)
Lymphs Abs: 1.5 10*3/uL (ref 0.7–4.0)
MCV: 102.5 fL — ABNORMAL HIGH (ref 78.0–100.0)
Monocytes Absolute: 0.8 10*3/uL (ref 0.1–1.0)
RBC: 2.84 MIL/uL — ABNORMAL LOW (ref 3.87–5.11)
WBC: 6.9 10*3/uL (ref 4.0–10.5)

## 2011-10-21 LAB — URINALYSIS, ROUTINE W REFLEX MICROSCOPIC
Glucose, UA: NEGATIVE mg/dL
Ketones, ur: NEGATIVE mg/dL
Protein, ur: NEGATIVE mg/dL

## 2011-10-21 LAB — COMPREHENSIVE METABOLIC PANEL
ALT: 41 U/L — ABNORMAL HIGH (ref 0–35)
Albumin: 2.1 g/dL — ABNORMAL LOW (ref 3.5–5.2)
Calcium: 8.9 mg/dL (ref 8.4–10.5)
GFR calc Af Amer: 62 mL/min — ABNORMAL LOW (ref >90)
Glucose, Bld: 128 mg/dL — ABNORMAL HIGH (ref 70–99)
Sodium: 144 mEq/L (ref 135–145)
Total Protein: 7.2 g/dL (ref 6.0–8.3)

## 2011-10-21 LAB — VANCOMYCIN, TROUGH: Vancomycin Tr: 6.8 ug/mL — ABNORMAL LOW (ref 10.0–20.0)

## 2011-10-22 LAB — URINE CULTURE: Colony Count: NO GROWTH

## 2011-10-22 LAB — CLOSTRIDIUM DIFFICILE BY PCR: Toxigenic C. Difficile by PCR: NEGATIVE

## 2011-10-23 ENCOUNTER — Other Ambulatory Visit (HOSPITAL_COMMUNITY): Payer: Self-pay

## 2011-10-23 DIAGNOSIS — J869 Pyothorax without fistula: Secondary | ICD-10-CM

## 2011-10-23 NOTE — Progress Notes (Signed)
Name: [name] MRN: [id number] DOB: 07-Apr-1970    LOS: 11  Referring Provider:  Stanton Kidney [facility]  Reason for Referral:  Postop respiratory failure    PULMONARY / CRITICAL CARE MEDICINE     Consult f/u   Brief patient description:  is a 41 y.o. y/o female with a PMH of [name] presented 7/28 with progressive SOB, cough, fevers, nausea. She was initially admitted by [facility] with PNA and large pleural effusion. She decompensated despite attempts at thoracentesis and required intubation and tx to [facility] service. She was seen in consultation by CVTS r/t concerns of loculated effusion and underwent L VATS by Dr. Cornelius Moras on 7/28. She had trial of extubation 7/29 and required reintubation 7/30 r/t hypoxia and resp distress. She failed further attempts at wean, complicated by delirium and underwent trach placement by ENT 8/14. She was tfr to [facility] 8/15  LTAC for further weaning attempts.  PCCM called for consult AM 8/20 as pt became agitated on vent and not in sync with vent.    DEVICES ETT:  7/28 >>> 7/29, 7/30 >>>8/14, TRACH [name] 8/14>> Lines: Rt IJ CVL 7/28>>8/05, Lt PICC 8/05>>  Cultures: 7/28  Blood >>Diphtheroids 7/28  Pleural fluid >>negative 7/28 Sputum>>negative 8/04 Sputum>>Candida 8/05 Urine>>negative 8/15 resp c/s >>>enterococcus 8/14 urine >100K yeast 8/14 BCx2>>>neg  Antibiotics: Rocephin (CAP) 7/28 >>> 8/4 Azithromycin  7/28 >>> 8/1 Vanc (Empyema) 7/28 >>> 8/4 Zosyn 8/4 >>>8/07 Cipro 8/4 >>>8/07 Zyvox 8/4 >>>8/07 8/15 zosyn>> 8/15 vanco>> 8/15 diflucan (yeast in urine/skin)   Events Since Admission: 7/28  Failed thoracentesis due to loculations, decrtication for empyema, reintubated in PACU 7/29  Extubated 7/30  Reintubated, hypoxia, distress 7/31  Removal chest tube 8/4    Increased oxygen requirements / WBC; ARDS vs edema; ARDS protocol, ABx changes, Lasix trial 8/9    Decreased tolerance to sbt, agitation 8/10  Sedated, comfortable on  vent, no acute distress 8/11  Trial SBT with Mother at bedside, on low dose fentanyl - 8/12 and 8/13: Did well on SBT but with fent gtt on board. OFf fent gtt -> RASS +4 and vent dysnchrony. Also, cxr worse with pulmonary edema chf pattern 8/15 Tfr to LTAC 8/20 breath stacking on vent, needed more sedation and change to PCV mode   SUBJECTIVE/OVERNIGHT/INTERVAL HX On ATC. No distress. Only c/o is mild abd discomfort.   Vital Signs:  Reviewed. sats 99% on 40% afebrile   Physical Examination: General:  NAD Neuro: RASS -2  on propofol  HEENT: trach site c/d,  enlarged tongue,  Cardiovascular:  s1s2 regular Lungs:  Decreased breath sounds, scattered rhonchi, Abdomen:  Soft nontender no organomegaly Musculoskeletal:  ankle edema Skin:  Intact  Dg Chest Port 1 View  10/21/2011  *RADIOLOGY REPORT*  Clinical Data: Fever, left pleural effusion  PORTABLE CHEST - 1 VIEW  Comparison: 10/21/2011  Findings: Cardiomegaly with moderate interstitial edema and bilateral pleural effusions, left greater than right.  Stable tracheostomy, enteric tube, and left arm PICC likely terminating in the azygos vein.  No pneumothorax.  IMPRESSION: Cardiomegaly with moderate interstitial edema and bilateral pleural effusions, left greater than right.  Stable support apparatus, including a left arm PICC likely terminating in the azygos vein.   Original Report Authenticated By: Charline Bills, M.D.   agree-->improved aeration when c/w prior film.   ASSESSMENT AND PLAN  PULMONARY  Lab 10/17/11 1215 10/17/11 1045  PHART 7.439 7.444  PCO2ART 51.1* 49.9*  PO2ART 80.1 62.4*  HCO3 34.1* 33.7*  O2SAT 97.5  93.8    A:   1) Empyema s/p decortication On Left  2) Pneumonia.  Left sided NOS 3) Acute respiratory failure. ARDS  4) Volume excess  5) Candidal rash around neck 8/08 and yeast in urine Recs: Cont ATC trials w/ rest at HS per protocol  Diurese as tol >>decrease  to 40mg  per tube lasix q24H abx per primary  service.     Attending Addendum:  I have seen the patient, discussed the issues, test results and plans with Kreg Shropshire, NP. I agree with the Assessment and Plans as ammended above.   Levy Pupa, MD, PhD 10/23/2011, 12:01 PM [facility] [phone] or if no answer [phone]

## 2011-10-24 ENCOUNTER — Other Ambulatory Visit (HOSPITAL_COMMUNITY): Payer: Self-pay

## 2011-10-25 LAB — CBC
Hemoglobin: 9.4 g/dL — ABNORMAL LOW (ref 12.0–15.0)
MCH: 32.3 pg (ref 26.0–34.0)
MCV: 103.1 fL — ABNORMAL HIGH (ref 78.0–100.0)
RBC: 2.91 MIL/uL — ABNORMAL LOW (ref 3.87–5.11)

## 2011-10-25 LAB — BASIC METABOLIC PANEL
BUN: 22 mg/dL (ref 6–23)
CO2: 33 mEq/L — ABNORMAL HIGH (ref 19–32)
Chloride: 100 mEq/L (ref 96–112)
Creatinine, Ser: 1.03 mg/dL (ref 0.50–1.10)
Potassium: 3.3 mEq/L — ABNORMAL LOW (ref 3.5–5.1)

## 2011-10-25 NOTE — Progress Notes (Signed)
Name: [name] MRN: [id number] DOB: [birth year]    LOS: 13  Referring Provider:  Stanton Kidney [facility]  Reason for Referral:  Postop respiratory failure    PULMONARY / CRITICAL CARE MEDICINE     Consult f/u   Brief patient description:  is a 41 y.o. y/o female with a PMH of [name] presented 7/28 with progressive SOB, cough, fevers, nausea. She was initially admitted by [facility] with PNA and large pleural effusion. She decompensated despite attempts at thoracentesis and required intubation and tx to [facility] service. She was seen in consultation by CVTS r/t concerns of loculated effusion and underwent L VATS by Dr. Cornelius Moras on 7/28. She had trial of extubation 7/29 and required reintubation 7/30 r/t hypoxia and resp distress. She failed further attempts at wean, complicated by delirium and underwent trach placement by ENT 8/14. She was tfr to [facility] 8/15  LTAC for further weaning attempts.  PCCM called for consult AM 8/20 as pt became agitated on vent and not in sync with vent.    DEVICES ETT:  7/28 >>> 7/29, 7/30 >>>8/14, TRACH [name] 8/14>> Lines: Rt IJ CVL 7/28>>8/05, Lt PICC 8/05>>   Events Since Admission: 7/28  Failed thoracentesis due to loculations, decrtication for empyema, reintubated in PACU 7/29  Extubated 7/30  Reintubated, hypoxia, distress 7/31  Removal chest tube 8/4    Increased oxygen requirements / WBC; ARDS vs edema; ARDS protocol, ABx changes, Lasix trial 8/9    Decreased tolerance to sbt, agitation 8/10  Sedated, comfortable on vent, no acute distress 8/11  Trial SBT with Mother at bedside, on low dose fentanyl - 8/12 and 8/13: Did well on SBT but with fent gtt on board. OFf fent gtt -> RASS +4 and vent dysnchrony. Also, cxr worse with pulmonary edema chf pattern 8/15 Tfr to LTAC 8/20 breath stacking on vent, needed more sedation and change to PCV mode 8/28 comfortable on t collar  SUBJECTIVE/OVERNIGHT/INTERVAL HX On ATC. No distress.   Vital  Signs:  Reviewed. sats 99% on 40% afebrile   Physical Examination: General:  NAD, very pleasant on t collar Neuro: intact HEENT: trach site c/d,  enlarged tongue,  Cardiovascular:  s1s2 regular Lungs:  Decreased breath sounds, scattered rhonchi, Abdomen:  Soft nontender no organomegaly Musculoskeletal:  ankle edema Skin:  Intact  Dg Abd Portable 1v  10/24/2011  *RADIOLOGY REPORT*  Clinical Data: Feeding tube placement.  PORTABLE ABDOMEN - 1 VIEW  Comparison: 10/23/2011  Findings: Feeding tube tip projects over the distal stomach. Nonobstructive bowel gas pattern.  Bibasilar opacities are partially imaged.  Leftward curvature of the lumbar spine.  IMPRESSION: Feeding tube tip projects over the distal stomach.   Original Report Authenticated By: Waneta Martins, M.D.    Dg Abd Portable 1v  10/23/2011  *RADIOLOGY REPORT*  Clinical Data: Abdominal distention  PORTABLE ABDOMEN - 1 VIEW  Comparison: 10/21/2011  Findings: Supine portable view of the abdomen shows a slight interval increase in diffuse gaseous distention of the colon.  No gaseous small bowel dilatation.  Feeding tube is looped upon itself in the stomach.  IMPRESSION: The slight increase and gaseous distention of the colon.  Colonic ileus could have this appearance.  Feeding tube tip is in the stomach.   Original Report Authenticated By: ERIC A. MANSELL, M.D.     ASSESSMENT AND PLAN  PULMONARY No results found for this basename: PHART:5,PCO2:5,PCO2ART:5,PO2ART:5,HCO3:5,O2SAT:5 in the last 168 hours  A:   1) Empyema s/p decortication On Left  2)  Pneumonia.  Left sided NOS 3) Acute respiratory failure. ARDS  4) Volume excess  5) Candidal rash around neck 8/08 and yeast in urine Recs: Cont ATC trials w/ rest at HS per protocol  Should be ready for PMV trials, cuff down Diurese as tol  abx per primary service.     Brett Canales Minor ACNP [facility] Pager [phone] till 3 pm If no answer page [phone] 10/25/2011, 11:22  AM   Attending Addendum:  I have seen the patient, discussed the issues, test results and plans with S. Minor, NP. I agree with the Assessment and Plans as ammended above.   Levy Pupa, MD, PhD 10/25/2011, 11:55 AM [facility] [phone] or if no answer [phone]

## 2011-10-27 ENCOUNTER — Other Ambulatory Visit (HOSPITAL_COMMUNITY): Payer: Self-pay

## 2011-10-27 LAB — CULTURE, BLOOD (ROUTINE X 2)

## 2011-10-27 LAB — CBC
HCT: 32.1 % — ABNORMAL LOW (ref 36.0–46.0)
Hemoglobin: 10 g/dL — ABNORMAL LOW (ref 12.0–15.0)
MCHC: 31.2 g/dL (ref 30.0–36.0)
MCV: 101.3 fL — ABNORMAL HIGH (ref 78.0–100.0)
RDW: 16.6 % — ABNORMAL HIGH (ref 11.5–15.5)
WBC: 6.9 10*3/uL (ref 4.0–10.5)

## 2011-10-27 LAB — BASIC METABOLIC PANEL
BUN: 20 mg/dL (ref 6–23)
Chloride: 98 mEq/L (ref 96–112)
Creatinine, Ser: 0.85 mg/dL (ref 0.50–1.10)
GFR calc Af Amer: >90 mL/min (ref >90)
Glucose, Bld: 126 mg/dL — ABNORMAL HIGH (ref 70–99)

## 2011-10-27 NOTE — Progress Notes (Signed)
Name: [name] MRN: [id number] DOB: 10/13/1970    LOS: 15  Referring Provider:  Stanton Kidney [facility]  Reason for Referral:  Postop respiratory failure    PULMONARY / CRITICAL CARE MEDICINE     Consult f/u   Brief patient description:  is a 41 y.o. y/o female with a PMH of [name] presented 7/28 with progressive SOB, cough, fevers, nausea. She was initially admitted by [facility] with PNA and large pleural effusion. She decompensated despite attempts at thoracentesis and required intubation and tx to [facility] service. She was seen in consultation by CVTS r/t concerns of loculated effusion and underwent L VATS by Dr. Cornelius Moras on 7/28. She had trial of extubation 7/29 and required reintubation 7/30 r/t hypoxia and resp distress. She failed further attempts at wean, complicated by delirium and underwent trach placement by ENT 8/14. She was tfr to [facility] 8/15  LTAC for further weaning attempts.  PCCM called for consult AM 8/20 as pt became agitated on vent and not in sync with vent.    DEVICES ETT:  7/28 >>> 7/29, 7/30 >>>8/14, TRACH [name] 8/14>> Lines: Rt IJ CVL 7/28>>8/05, Lt PICC 8/05>>   Events Since Admission: 7/28  Failed thoracentesis due to loculations, decrtication for empyema, reintubated in PACU 7/29  Extubated 7/30  Reintubated, hypoxia, distress 7/31  Removal chest tube 8/4    Increased oxygen requirements / WBC; ARDS vs edema; ARDS protocol, ABx changes, Lasix trial 8/9    Decreased tolerance to sbt, agitation 8/10  Sedated, comfortable on vent, no acute distress 8/11  Trial SBT with Mother at bedside, on low dose fentanyl - 8/12 and 8/13: Did well on SBT but with fent gtt on board. OFf fent gtt -> RASS +4 and vent dysnchrony. Also, cxr worse with pulmonary edema chf pattern 8/15 Tfr to LTAC 8/20 breath stacking on vent, needed more sedation and change to PCV mode 8/28 comfortable on t collar  SUBJECTIVE/OVERNIGHT/INTERVAL HX On ATC. No distress. Wants to  talk Vital Signs:  Reviewed. sats 99% on 30% ATC  Physical Examination: General:  NAD, very pleasant on t collar Neuro: intact HEENT: trach site c/d,  enlarged tongue,  Cardiovascular:  s1s2 regular Lungs:  Decreased breath sounds, scattered rhonchi, Abdomen:  Soft nontender no organomegaly Musculoskeletal:  ankle edema Skin:  Intact  Lab 10/25/11 0700 10/21/11 0420 10/20/11 2025  NA 142 144 --  K 3.3* 3.5 3.6  CL 100 102 --  CO2 33* 32 --  BUN 22 21 --  CREATININE 1.03 1.23* --  GLUCOSE 125* 128* --    Lab 10/25/11 0700 10/21/11 1510  HGB 9.4* 9.1*  HCT 30.0* 29.1*  WBC 6.5 6.9  PLT 250 228     No results found. Pcxr: diffuse bilateral airspace disease. No sig improvement in aeration.  ASSESSMENT AND PLAN  PULMONARY No results found for this basename: PHART:5,PCO2:5,PCO2ART:5,PO2ART:5,HCO3:5,O2SAT:5 in the last 168 hours  A:   1) Empyema s/p decortication On Left  2) Pneumonia.  Left sided NOS 3) Acute respiratory failure. ARDS  4) Volume excess  5) Candidal rash around neck 8/08 and yeast in urine No distress. From pulm standpoint she is clinically advancing well. Radiographically she is about the same. Think she is ready to try PMV and eval swallow.  Recs: Cont ATC trials w/ rest at HS per protocol  Diurese as tol  abx per primary service.  Down size to 6 Continue to try PMV and swallow per SLP services.   Anders Simmonds,  ACNP   Attending Addendum:  I have seen the patient, discussed the issues, test results and plans with Kreg Shropshire, NP. I agree with the Assessment and Plans as ammended above.   Levy Pupa, MD, PhD 10/27/2011, 10:35 AM [facility] [phone] or if no answer [phone]

## 2011-10-28 LAB — BASIC METABOLIC PANEL
BUN: 21 mg/dL (ref 6–23)
CO2: 31 mEq/L (ref 19–32)
Calcium: 8.9 mg/dL (ref 8.4–10.5)
Creatinine, Ser: 0.88 mg/dL (ref 0.50–1.10)

## 2011-10-28 LAB — VANCOMYCIN, TROUGH: Vancomycin Tr: 11.4 ug/mL (ref 10.0–20.0)

## 2011-10-29 LAB — BASIC METABOLIC PANEL
BUN: 21 mg/dL (ref 6–23)
CO2: 27 mEq/L (ref 19–32)
Calcium: 9.1 mg/dL (ref 8.4–10.5)
Chloride: 102 mEq/L (ref 96–112)
Creatinine, Ser: 0.82 mg/dL (ref 0.50–1.10)
Glucose, Bld: 128 mg/dL — ABNORMAL HIGH (ref 70–99)

## 2011-10-29 LAB — MAGNESIUM: Magnesium: 2.1 mg/dL (ref 1.5–2.5)

## 2011-10-30 LAB — CBC
HCT: 32.6 % — ABNORMAL LOW (ref 36.0–46.0)
Hemoglobin: 10.3 g/dL — ABNORMAL LOW (ref 12.0–15.0)
MCH: 31.7 pg (ref 26.0–34.0)
MCHC: 31.6 g/dL (ref 30.0–36.0)
MCV: 100.3 fL — ABNORMAL HIGH (ref 78.0–100.0)
RDW: 17 % — ABNORMAL HIGH (ref 11.5–15.5)

## 2011-10-31 ENCOUNTER — Other Ambulatory Visit (HOSPITAL_COMMUNITY): Payer: Self-pay

## 2011-10-31 DIAGNOSIS — R7309 Other abnormal glucose: Secondary | ICD-10-CM

## 2011-10-31 DIAGNOSIS — D72829 Elevated white blood cell count, unspecified: Secondary | ICD-10-CM

## 2011-10-31 NOTE — Progress Notes (Addendum)
Name: [name] MRN: [id number] DOB: 08-12-1970    LOS: 19  Referring Provider:  Stanton Sosa [facility]  Reason for Referral:  Postop respiratory failure    PULMONARY / CRITICAL CARE MEDICINE     Consult f/u   Brief patient description:  is a 41 y.o. y/o female with a PMH of [name] presented 7/28 with progressive SOB, cough, fevers, nausea. She was initially admitted by [facility] with PNA and large pleural effusion. She decompensated despite attempts at thoracentesis and required intubation and tx to [facility] service. She was seen in consultation by CVTS r/t concerns of loculated effusion and underwent L VATS by Dr. Cornelius Moras on 7/28. She had trial of extubation 7/29 and required reintubation 7/30 r/t hypoxia and resp distress. She failed further attempts at wean, complicated by delirium and underwent trach placement by ENT 8/14. She was tfr to [facility] 8/15  LTAC for further weaning attempts.  PCCM called for consult AM 8/20 as pt became agitated on vent and not in sync with vent.    DEVICES ETT:  7/28 >>> 7/29, 7/30 >>>8/14, TRACH [name] 8/14>> Lines: Rt IJ CVL 7/28>>8/05, Lt PICC 8/05>>   Events Since Admission: 7/28  Failed thoracentesis due to loculations, decrtication for empyema, reintubated in PACU 7/29  Extubated 7/30  Reintubated, hypoxia, distress 7/31  Removal chest tube 8/4    Increased oxygen requirements / WBC; ARDS vs edema; ARDS protocol, ABx changes, Lasix trial 8/9    Decreased tolerance to sbt, agitation 8/10  Sedated, comfortable on vent, no acute distress 8/11  Trial SBT with Mother at bedside, on low dose fentanyl - 8/12 and 8/13: Did well on SBT but with fent gtt on board. OFf fent gtt -> RASS +4 and vent dysnchrony. Also, cxr worse with pulmonary edema chf pattern 8/15 Tfr to LTAC 8/20 breath stacking on vent, needed more sedation and change to PCV mode 8/28 comfortable on t collar  SUBJECTIVE/OVERNIGHT/INTERVAL HX On ATC. No distress. Wants to  talk, passed swallow Vital Signs:  Reviewed. sats 99% on 28% ATC  Physical Examination: General:  NAD, very pleasant on t collar Neuro: intact HEENT: trach site c/d,  enlarged tongue,  Cardiovascular:  s1s2 regular Lungs:  Decreased breath sounds,weak phonation.  Abdomen:  Soft nontender no organomegaly Musculoskeletal:  ankle edema Skin:  Intact  Lab 10/29/11 0421 10/28/11 0715 10/27/11 1032  NA 137 138 138  K 4.5 2.9* 2.9*  CL 102 98 98  CO2 27 31 30   BUN 21 21 20   CREATININE 0.82 0.88 0.85  GLUCOSE 128* 124* 126*    Lab 10/30/11 0258 10/27/11 1032 10/25/11 0700  HGB 10.3* 10.0* 9.4*  HCT 32.6* 32.1* 30.0*  WBC 6.1 6.9 6.5  PLT 222 263 250     No results found. Pcxr: diffuse bilateral airspace disease. No sig improvement in aeration.  ASSESSMENT AND PLAN  PULMONARY No results found for this basename: PHART:5,PCO2:5,PCO2ART:5,PO2ART:5,HCO3:5,O2SAT:5 in the last 168 hours  A:   1) Empyema s/p decortication On Left  2) Pneumonia.  Left sided NOS 3) Acute respiratory failure. ARDS  4) Volume excess  5) Candidal rash around neck 8/08 and yeast in urine Cont to push rehab efforts.  Recs: Cont ATC trials w/ rest at HS per protocol  Diurese as tol, seems important to improve off vent consider repeat pcxr this week abx per primary service.  Continue to try PMV and swallow per SLP services. Cuff down remain   Anders Simmonds, ACNP   I  have fully examined this patient and agree with above findings.    And edited in full  Mcarthur Rossetti. Tyson Alias, MD, FACP Pgr: [phone] [facility]

## 2011-11-02 LAB — CBC
MCH: 32.7 pg (ref 26.0–34.0)
MCHC: 32.3 g/dL (ref 30.0–36.0)
MCV: 101.2 fL — ABNORMAL HIGH (ref 78.0–100.0)
Platelets: 278 10*3/uL (ref 150–400)

## 2011-11-02 LAB — BASIC METABOLIC PANEL
CO2: 30 mEq/L (ref 19–32)
Calcium: 9.4 mg/dL (ref 8.4–10.5)
Creatinine, Ser: 1.06 mg/dL (ref 0.50–1.10)
GFR calc non Af Amer: 64 mL/min — ABNORMAL LOW (ref >90)
Sodium: 137 mEq/L (ref 135–145)

## 2011-11-03 ENCOUNTER — Inpatient Hospital Stay (HOSPITAL_COMMUNITY)
Admission: AD | Admit: 2011-11-03 | Discharge: 2011-11-03 | Disposition: A | Discharge status: Home / Self Care | Payer: Self-pay | Source: Ambulatory Visit | Attending: Internal Medicine | Admitting: Internal Medicine

## 2011-11-03 NOTE — Progress Notes (Signed)
Name: [name] MRN: [id number] DOB: 04/23/1970    LOS: 22  Referring Provider:  Stanton Kidney [facility]  Reason for Referral:  Postop respiratory failure    PULMONARY / CRITICAL CARE MEDICINE       Brief patient description:  41 y.o. y/o female with a PMH of [name] presented 7/28 with progressive SOB, cough, fevers, nausea. She was initially admitted by [facility] with PNA and large pleural effusion. She decompensated despite attempts at thoracentesis and required intubation and tx to [facility] service. She was seen in consultation by CVTS r/t concerns of loculated effusion and underwent L VATS by Dr. Cornelius Moras on 7/28. She had trial of extubation 7/29 and required reintubation 7/30 r/t hypoxia and resp distress. She failed further attempts at wean, complicated by delirium and underwent trach placement by ENT 8/14. She was tfr to [facility] 8/15  LTAC for further weaning attempts. PCCM called for consult AM 8/20 as pt became agitated on vent and not in sync with vent.    DEVICES ETT:  7/28 >>> 7/29, 7/30 >>>8/14, TRACH [name] 8/14>> Lines: Rt IJ CVL 7/28>>8/05, Lt PICC 8/05>>  Events Since Admission: 7/28  Failed thoracentesis due to loculations, decrtication for empyema, reintubated in PACU 7/29  Extubated 7/30  Reintubated, hypoxia, distress 7/31  Removal chest tube 8/4    Increased oxygen requirements / WBC; ARDS vs edema; ARDS protocol, ABx changes, Lasix trial 8/9    Decreased tolerance to sbt, agitation 8/10  Sedated, comfortable on vent, no acute distress 8/11  Trial SBT with Mother at bedside, on low dose fentanyl - 8/12 and 8/13: Did well on SBT but with fent gtt on board. OFf fent gtt -> RASS +4 and vent dysnchrony. Also, cxr worse with pulmonary edema chf pattern 8/15 Tfr to LTAC 8/20 breath stacking on vent, needed more sedation and change to PCV mode 8/28 comfortable on t collar 9/6 tolerating PMV well  SUBJECTIVE/OVERNIGHT/INTERVAL HX On ATC. No distress.  Tolerating PMV  Vital Signs:  Reviewed. sats 99% on 28% ATC  Physical Examination: General:  NAD, very pleasant on t collar Neuro: intact HEENT: trach site c/d/i Cardiovascular:  s1s2 regular Lungs:  Decreased breath sounds, lungs clear, strong voice with PMV Abdomen:  Soft nontender no organomegaly Musculoskeletal:  ankle edema Skin:  Intact  Lab 11/02/11 0440 10/29/11 0421 10/28/11 0715  NA 137 137 138  K 3.8 4.5 2.9*  CL 96 102 98  CO2 30 27 31   BUN 28* 21 21  CREATININE 1.06 0.82 0.88  GLUCOSE 123* 128* 124*    Lab 11/02/11 0440 10/30/11 0258  HGB 11.2* 10.3*  HCT 34.7* 32.6*  WBC 8.5 6.1  PLT 278 222     No results found.   ASSESSMENT AND PLAN  PULMONARY No results found for this basename: PHART:5,PCO2:5,PCO2ART:5,PO2ART:5,HCO3:5,O2SAT:5 in the last 168 hours  A:   1) Empyema s/p decortication on Left  2) Pneumonia.  Left sided NOS 3) Acute respiratory failure / ARDS  4) Volume excess  5) Candidal rash around neck 8/08 and yeast in urine  Cont to push rehab efforts.   Recs: Cont ATC trials, NO nocturnal vent needed Diurese as tol, seems important to improve off vent Continue to try PMV and swallow per SLP services. Cuff to remain down Should continue down path of likely decannulation If weekend uneventful, then would decan likely Monday pcxr Monday am for atx  Canary Brim, NP-C [facility] Pgr: [phone] or [phone]   I  have fully examined this patient and agree with above findings.    And edited in full  Mcarthur Rossetti. Tyson Alias, MD, FACP Pgr: [phone] [facility]

## 2011-11-04 NOTE — Procedures (Signed)
History: 41 yo F with down syndrome, staring spells and an episode of full body shaking.   Sedation: None  Background: There is a well defined posterior dominant rhythm of 8 Hz that attenuates with eye opening. There was occasional midline theta activity.   Hyperventilation: Not performed  Photic stimulation: Physiologic driving is present  EEG Diagnosis: 1) Rare left temporal sharp waves at P3,P7 > O1 2) Borderline PDR  Clinical Interpretation: This abnormal EEG is consistent with a region of potential epileptogenicty in the left occipitoparietal region. There was no seizure recorded on this study.   Ritta Slot, MD [facility] [phone]

## 2011-11-06 ENCOUNTER — Other Ambulatory Visit (HOSPITAL_COMMUNITY): Payer: Self-pay

## 2011-11-06 NOTE — Progress Notes (Signed)
Name: [name] MRN: [id number] DOB: 1970-05-11    LOS: 25  Referring Provider:  Stanton Kidney [facility]  Reason for Referral:  Postop respiratory failure    PULMONARY / CRITICAL CARE MEDICINE       Brief patient description:  42 y.o. y/o female with a PMH of [name] presented 7/28 with progressive SOB, cough, fevers, nausea. She was initially admitted by [facility] with PNA and large pleural effusion. She decompensated despite attempts at thoracentesis and required intubation and tx to [facility] service. She was seen in consultation by CVTS r/t concerns of loculated effusion and underwent L VATS by Dr. Cornelius Moras on 7/28. She had trial of extubation 7/29 and required reintubation 7/30 r/t hypoxia and resp distress. She failed further attempts at wean, complicated by delirium and underwent trach placement by ENT 8/14. She was tfr to [facility] 8/15  LTAC for further weaning attempts. PCCM called for consult AM 8/20 as pt became agitated on vent and not in sync with vent.    DEVICES ETT:  7/28 >>> 7/29, 7/30 >>>8/14, TRACH [name] 8/14>> Lines: Rt IJ CVL 7/28>>8/05, Lt PICC 8/05>>  Events Since Admission: 7/28  Failed thoracentesis due to loculations, decrtication for empyema, reintubated in PACU 7/29  Extubated 7/30  Reintubated, hypoxia, distress 7/31  Removal chest tube 8/4    Increased oxygen requirements / WBC; ARDS vs edema; ARDS protocol, ABx changes, Lasix trial 8/9    Decreased tolerance to sbt, agitation 8/10  Sedated, comfortable on vent, no acute distress 8/11  Trial SBT with Mother at bedside, on low dose fentanyl - 8/12 and 8/13: Did well on SBT but with fent gtt on board. OFf fent gtt -> RASS +4 and vent dysnchrony. Also, cxr worse with pulmonary edema chf pattern 8/15 Tfr to LTAC 8/20 breath stacking on vent, needed more sedation and change to PCV mode 8/28 comfortable on t collar 9/6 tolerating PMV well 9/9 doing well with PMV, no  issues   SUBJECTIVE/OVERNIGHT/INTERVAL HX On ATC. No distress. Tolerating PMV  Vital Signs:  Reviewed. sats 99% on 28% ATC  Physical Examination: General:  NAD, very pleasant on t collar Neuro: intact HEENT: trach site c/d/i Cardiovascular:  s1s2 regular Lungs:  Decreased breath sounds, lungs clear, strong voice with PMV Abdomen:  Soft nontender no organomegaly Musculoskeletal:  ankle edema Skin:  Intact  Lab 11/02/11 0440  NA 137  K 3.8  CL 96  CO2 30  BUN 28*  CREATININE 1.06  GLUCOSE 123*    Lab 11/02/11 0440  HGB 11.2*  HCT 34.7*  WBC 8.5  PLT 278     Dg Chest Portable 1 View  11/06/2011  *RADIOLOGY REPORT*  Clinical Data: Tracheostomy.  Respiratory failure.  PORTABLE CHEST - 1 VIEW  Comparison: 10/27/2011  Findings: Tracheostomy remains in place.  Soft feeding tube has been removed.  Left arm PICC has its tip in the proximal right atrium.  Diffuse pulmonary opacity is improved.  There is a small amount of pleural fluid, left more than right with basilar volume loss.  IMPRESSION: Improved diffuse pulmonary density.  Small effusions and basilar volume loss left more than right.   Original Report Authenticated By: Thomasenia Sales, M.D.      ASSESSMENT AND PLAN  PULMONARY No results found for this basename: PHART:5,PCO2:5,PCO2ART:5,PO2ART:5,HCO3:5,O2SAT:5 in the last 168 hours  CXR:  9/9 Improved diffuse pulmonary density. Small effusions and basilar volume loss left more than right.   A:   1) Empyema s/p decortication  on Left  2) Pneumonia.  Left sided NOS 3) Acute respiratory failure / ARDS  4) Volume excess  5) Candidal rash around neck 8/08 and yeast in urine    Recs: De-cannulate 9/9 Monitor trach site Diurese as tol Push PT/OT efforts  Will follow up on Wednesday.    Canary Brim, NP-C [facility] Pgr: [phone] or [phone]  CC time 30 minutes  Attending Addendum:  I have seen the patient, discussed the issues, test  results and plans with B. Veleta Miners, NP. I agree with the Assessment and Plans as ammended above.   Levy Pupa, MD, PhD 11/06/2011, 12:27 PM [facility] [phone] or if no answer [phone]

## 2011-11-08 NOTE — Progress Notes (Addendum)
Name: [name] MRN: [id number] DOB: [birth year]    LOS: 27  Referring Provider:  Stanton Kidney [facility]  Reason for Referral:  Postop respiratory failure    PULMONARY / CRITICAL CARE MEDICINE       Brief patient description:  41 y.o. y/o female with a PMH of [name] presented 7/28 with progressive SOB, cough, fevers, nausea. She was initially admitted by [facility] with PNA and large pleural effusion. She decompensated despite attempts at thoracentesis and required intubation and tx to [facility] service. She was seen in consultation by CVTS r/t concerns of loculated effusion and underwent L VATS by Dr. Cornelius Moras on 7/28. She had trial of extubation 7/29 and required reintubation 7/30 r/t hypoxia and resp distress. She failed further attempts at wean, complicated by delirium and underwent trach placement by ENT 8/14. She was tfr to [facility] 8/15  LTAC for further weaning attempts. PCCM called for consult AM 8/20 as pt became agitated on vent and not in sync with vent.    DEVICES ETT:  7/28 >>> 7/29, 7/30 >>>8/14, TRACH [name] 8/14>> Lines: Rt IJ CVL 7/28>>8/05, Lt PICC 8/05>>  Events Since Admission: 7/28  Failed thoracentesis due to loculations, decrtication for empyema, reintubated in PACU 7/29  Extubated 7/30  Reintubated, hypoxia, distress 7/31  Removal chest tube 8/4    Increased oxygen requirements / WBC; ARDS vs edema; ARDS protocol, ABx changes, Lasix trial 8/9    Decreased tolerance to sbt, agitation 8/10  Sedated, comfortable on vent, no acute distress 8/11  Trial SBT with Mother at bedside, on low dose fentanyl - 8/12 and 8/13: Did well on SBT but with fent gtt on board. OFf fent gtt -> RASS +4 and vent dysnchrony. Also, cxr worse with pulmonary edema chf pattern 8/15 Tfr to LTAC 8/20 breath stacking on vent, needed more sedation and change to PCV mode 8/28 comfortable on t collar 9/6 tolerating PMV well 9/9 doing well with PMV, no issues 9/9 de  cannulated.   SUBJECTIVE/OVERNIGHT/INTERVAL HX NAD with trach out.  Vital Signs:  Reviewed. sats 99% on 28% ATC  Physical Examination: General:  NAD, very pleasant on t collar Neuro: intact HEENT: trach site c/d/i Cardiovascular:  s1s2 regular Lungs:  Decreased breath sounds, lungs clear, strong voice with PMV Abdomen:  Soft nontender no organomegaly Musculoskeletal:  ankle edema Skin:  Intact  Lab 11/02/11 0440  NA 137  K 3.8  CL 96  CO2 30  BUN 28*  CREATININE 1.06  GLUCOSE 123*    Lab 11/02/11 0440  HGB 11.2*  HCT 34.7*  WBC 8.5  PLT 278     No results found.   ASSESSMENT AND PLAN  PULMONARY No results found for this basename: PHART:5,PCO2:5,PCO2ART:5,PO2ART:5,HCO3:5,O2SAT:5 in the last 168 hours  CXR:  9/9 Improved diffuse pulmonary density. Small effusions and basilar volume loss left more than right.   A:   1) Empyema s/p decortication on Left  2) Pneumonia.  Left sided NOS 3) Acute respiratory failure / ARDS  4) Volume excess  5) Candidal rash around neck 8/08 and yeast in urine    Recs: De-cannulated 9/9 Monitor trach site Diurese as tol Push PT/OT efforts  Will sign off and please call if needed.  Brett Canales Minor ACNP [facility] P[facility] Pager [phone] till 3 pm If no answer page [phone] 11/08/2011, 11:15 AM   Attending Addendum:  I have seen the patient, discussed the issues, test results and plans with S. Minor, NP. I agree with the Assessment  and Plans as ammended above.   Levy Pupa, MD, PhD 11/08/2011, 11:55 AM [facility] [phone] or if no answer [phone]

## 2011-11-09 ENCOUNTER — Ambulatory Visit: Payer: Medicare Other | Admitting: Family Medicine

## 2011-11-13 ENCOUNTER — Ambulatory Visit (INDEPENDENT_AMBULATORY_CARE_PROVIDER_SITE_OTHER): Payer: Medicare Other | Admitting: Family Medicine

## 2011-11-13 ENCOUNTER — Encounter: Payer: Self-pay | Admitting: Family Medicine

## 2011-11-13 VITALS — BP 124/72 | HR 92 | Temp 98.2°F | Wt 213.8 lb

## 2011-11-13 DIAGNOSIS — J9600 Acute respiratory failure, unspecified whether with hypoxia or hypercapnia: Secondary | ICD-10-CM

## 2011-11-13 DIAGNOSIS — M1990 Unspecified osteoarthritis, unspecified site: Secondary | ICD-10-CM

## 2011-11-13 DIAGNOSIS — M129 Arthropathy, unspecified: Secondary | ICD-10-CM

## 2011-11-13 DIAGNOSIS — F3342 Major depressive disorder, recurrent, in full remission: Secondary | ICD-10-CM | POA: Insufficient documentation

## 2011-11-13 DIAGNOSIS — Z8639 Personal history of other endocrine, nutritional and metabolic disease: Secondary | ICD-10-CM

## 2011-11-13 DIAGNOSIS — H6120 Impacted cerumen, unspecified ear: Secondary | ICD-10-CM

## 2011-11-13 DIAGNOSIS — F329 Major depressive disorder, single episode, unspecified: Secondary | ICD-10-CM

## 2011-11-13 DIAGNOSIS — R569 Unspecified convulsions: Secondary | ICD-10-CM

## 2011-11-13 DIAGNOSIS — J9601 Acute respiratory failure with hypoxia: Secondary | ICD-10-CM

## 2011-11-13 DIAGNOSIS — R7402 Elevation of levels of lactic acid dehydrogenase (LDH): Secondary | ICD-10-CM

## 2011-11-13 DIAGNOSIS — F331 Major depressive disorder, recurrent, moderate: Secondary | ICD-10-CM | POA: Insufficient documentation

## 2011-11-13 DIAGNOSIS — Z8701 Personal history of pneumonia (recurrent): Secondary | ICD-10-CM

## 2011-11-13 DIAGNOSIS — F3289 Other specified depressive episodes: Secondary | ICD-10-CM

## 2011-11-13 DIAGNOSIS — E039 Hypothyroidism, unspecified: Secondary | ICD-10-CM

## 2011-11-13 DIAGNOSIS — Q909 Down syndrome, unspecified: Secondary | ICD-10-CM

## 2011-11-13 DIAGNOSIS — J869 Pyothorax without fistula: Secondary | ICD-10-CM

## 2011-11-13 DIAGNOSIS — R7303 Prediabetes: Secondary | ICD-10-CM | POA: Insufficient documentation

## 2011-11-13 DIAGNOSIS — Z862 Personal history of diseases of the blood and blood-forming organs and certain disorders involving the immune mechanism: Secondary | ICD-10-CM

## 2011-11-13 MED ORDER — FLUOXETINE HCL 20 MG PO CAPS: 20.0000 mg | ORAL_CAPSULE | Freq: Every day | ORAL | Status: DC

## 2011-11-13 NOTE — Progress Notes (Signed)
Subjective:    Patient ID: [name], female    DOB: Nov 14, 1970, 41 y.o.   MRN: [id number]  HPI CC: new pt to establish  Prior seen at [facility] - have requested records.  Pulm - [facility] [place] [facility] and during [facility].  No f/u planned. Neuro - [name] or [name]  Daughter of [name] and [name].  Pleasant 41 yo with h/o [name], recent hospitalization for VDRF 2/2 PNA with empyema s/p L VATS by Dr. Cornelius Moras and then tracheostomy when failed wean from vent.  Records reviewed.  Admitted 09/24/2011, discharged to [facility] facility 10/12/2011.  Discharged to home 11/10/2011.  Trache decannulated 11/06/2011.  Knee pain - R knee bothering her since discharge from [facility].  H/o knee arthritis bilaterally, earlier this year s/p viscosupplementation by Delbert Harness x3.   bilat cerumen impaction - saw ENT and recommended ear drops which she stopped during hospitalization.  From discharge summary: Admit date: 09/24/2011  Discharge date: 10/12/2011   Discharge Diagnoses:  Principal Problem:  *Empyema of left pleural space  Active Problems:  Shortness of breath  PNA (pneumonia)  Leukocytosis  Tachycardia  Transaminitis  Hyperglycemia  Pleural effusion  Acute respiratory failure with hypoxia  Medications and allergies reviewed and updated in chart.  Past histories reviewed and updated if relevant as below. Patient Active Problem List  Diagnosis  . Shortness of breath  . PNA (pneumonia)  . Leukocytosis  . Tachycardia  . Transaminitis  . Hyperglycemia  . Pleural effusion  . Empyema of left pleural space  . Acute respiratory failure with hypoxia   Past Medical History  Diagnosis Date  . Down syndrome   . Pneumonia     multiple pneumonias  . Empyema of left pleural space 09/24/2011  . Arthritis     bilat knee arthritis, saw Erik Obey and had gel injections  . Depression     with OCD  . History of diabetes mellitus   . Hypothyroid   .  Seizure     x 2 during hospitalization   Past Surgical History  Procedure Date  . Back surgery     ruptured disc  . Anterior cruciate ligament repair   . Flexible bronchoscopy 09/24/2011    Procedure: FLEXIBLE BRONCHOSCOPY;  Surgeon: Purcell Nails, MD;  Location: [facility] OR;  Service: Thoracic;  Laterality: N/A;  . Tracheostomy tube placement 10/11/2011    Procedure: TRACHEOSTOMY;  Surgeon: Serena Colonel, MD;  Location: [facility] OR;  Service: ENT;  Laterality: N/A;  . Left vats     (Dr. Barry Dienes)   History  Substance Use Topics  . Smoking status: Never Smoker   . Smokeless tobacco: Never Used  . Alcohol Use: No   Family History  Problem Relation Age of Onset  . Arthritis Mother   . Hypertension Father   . Diabetes Father   . Cancer Neg Hx   . Coronary artery disease Neg Hx   . Stroke Neg Hx    No Known Allergies Current Outpatient Prescriptions on File Prior to Visit  Medication Sig Dispense Refill  . albuterol (PROVENTIL) (2.5 MG/3ML) 0.083% nebulizer solution Take 2.5 mg by nebulization every 6 (six) hours as needed.      . busPIRone (BUSPAR) 15 MG tablet Take 1 tablet (15 mg total) by mouth 2 (two) times daily.      . furosemide (LASIX) 20 MG tablet Take 20 mg by mouth daily.      Marland Kitchen ipratropium (ATROVENT) 0.02 %  nebulizer solution Take 2.5 mLs (0.5 mg total) by nebulization every 6 (six) hours as needed.  75 mL    . levETIRAcetam (KEPPRA) 500 MG tablet Take 500 mg by mouth every 12 (twelve) hours.      Marland Kitchen levothyroxine (SYNTHROID, LEVOTHROID) 112 MCG tablet Take 1 tablet (112 mcg total) by mouth daily before breakfast.      . memantine (NAMENDA) 10 MG tablet Take 1 tablet (10 mg total) by mouth 2 (two) times daily.      . nitroGLYCERIN (NITROSTAT) 0.4 MG SL tablet Place 0.4 mg under the tongue every 5 (five) minutes as needed.          Review of Systems  Constitutional: Negative for fever, chills, activity change, appetite change, fatigue and unexpected weight change.  HENT: Negative  for hearing loss and neck pain.   Eyes: Negative for visual disturbance.  Respiratory: Negative for cough, choking, chest tightness, shortness of breath and wheezing.   Cardiovascular: Negative for chest pain, palpitations and leg swelling.  Gastrointestinal: Negative for nausea, vomiting, abdominal pain, diarrhea, constipation, blood in stool and abdominal distention.  Genitourinary: Negative for hematuria and difficulty urinating.  Musculoskeletal: Negative for myalgias and arthralgias.  Skin: Negative for rash.  Neurological: Negative for dizziness, seizures, syncope and headaches.  Hematological: Does not bruise/bleed easily.  Psychiatric/Behavioral: Negative for dysphoric mood. The patient is not nervous/anxious.        Objective:   Physical Exam  Nursing note and vitals reviewed. Constitutional: She is oriented to person, place, and time. She appears well-developed and well-nourished. No distress.       Trache wound in place, covered by gauze, healing well.  Minimal opening currently present.  Wearing mask  HENT:  Head: Normocephalic and atraumatic.  Right Ear: External ear normal.  Left Ear: External ear normal.  Nose: Nose normal.  Mouth/Throat: Oropharynx is clear and moist.  Eyes: Conjunctivae normal and EOM are normal. Pupils are equal, round, and reactive to light.  Neck: Normal range of motion. Neck supple. No thyromegaly present.  Cardiovascular: Normal rate, regular rhythm, normal heart sounds and intact distal pulses.   No murmur heard. Pulses:      Radial pulses are 2+ on the right side, and 2+ on the left side.  Pulmonary/Chest: Effort normal and breath sounds normal. No respiratory distress. She has no wheezes. She has no rales.  Abdominal: Soft. Bowel sounds are normal. She exhibits no distension and no mass. There is no tenderness. There is no rebound and no guarding.  Musculoskeletal: Normal range of motion.       R lateral knee joint pain to palpation. FROM  at knee In full extension, knee tender superior to patella  L knee WNL  Lymphadenopathy:    She has no cervical adenopathy.  Neurological: She is alert and oriented to person, place, and time.       CN grossly intact, station and gait intact  Skin: Skin is warm and dry. No rash noted.  Psychiatric: She has a normal mood and affect. Her behavior is normal. Judgment and thought content normal.       Assessment & Plan:

## 2011-11-13 NOTE — Patient Instructions (Addendum)
Try tylenol for right knee.  If not helping, may take aleve. Continue lidocaine patches. I will request records and review. Return to see me in 3 month, sooner if needed. Good to meet you today! Call us with quesitons. Watch for fevers.

## 2011-11-14 DIAGNOSIS — M1A9XX0 Chronic gout, unspecified, without tophus (tophi): Secondary | ICD-10-CM | POA: Insufficient documentation

## 2011-11-14 DIAGNOSIS — H6120 Impacted cerumen, unspecified ear: Secondary | ICD-10-CM | POA: Insufficient documentation

## 2011-11-14 NOTE — Assessment & Plan Note (Signed)
Seems to have resolved.  Lungs clear today.

## 2011-11-14 NOTE — Assessment & Plan Note (Signed)
Stable on keppra. Continue. 

## 2011-11-14 NOTE — Assessment & Plan Note (Signed)
Lab Results  Component Value Date   ALT 41* 10/21/2011   AST 32 10/21/2011   ALKPHOS 51 10/21/2011   BILITOT 0.3 10/21/2011  recheck when next returns for blood work.

## 2011-11-14 NOTE — Assessment & Plan Note (Signed)
rec continued use of ENT cerumenolytic ear drops

## 2011-11-14 NOTE — Assessment & Plan Note (Signed)
R knee arthritis - have requested records from [facility] and will review. Discussed may try tylenol for discomfort, if not helping, may try aleve. May also continue lidocaine patches

## 2011-11-14 NOTE — Assessment & Plan Note (Signed)
Only during feed with TPN.  Merits close f/u. Lab Results  Component Value Date   HGBA1C 5.9* 09/24/2011  consider recheck A1c next blood draw.

## 2011-11-14 NOTE — Assessment & Plan Note (Signed)
Have requested records from prior PCP and will review when arrive.

## 2011-11-14 NOTE — Assessment & Plan Note (Signed)
With trache, s/p decannulation last week.  Overall very stable from this standpoint - continue to monitor. Great O2 sats today.

## 2011-11-14 NOTE — Assessment & Plan Note (Addendum)
With empyema.  Stable from this standpoint currently. No more fevers/chills, productive cough.

## 2011-11-14 NOTE — Assessment & Plan Note (Signed)
Lab Results  Component Value Date   TSH 1.407 09/24/2011  stable on current dose of levothyroxine ( ).  Continue.

## 2011-11-15 ENCOUNTER — Telehealth: Payer: Self-pay | Admitting: *Deleted

## 2011-11-15 DIAGNOSIS — G4734 Idiopathic sleep related nonobstructive alveolar hypoventilation: Secondary | ICD-10-CM

## 2011-11-15 DIAGNOSIS — Q909 Down syndrome, unspecified: Secondary | ICD-10-CM

## 2011-11-15 DIAGNOSIS — G4733 Obstructive sleep apnea (adult) (pediatric): Secondary | ICD-10-CM | POA: Insufficient documentation

## 2011-11-15 DIAGNOSIS — E669 Obesity, unspecified: Secondary | ICD-10-CM

## 2011-11-15 NOTE — Telephone Encounter (Signed)
Patient's mom called and said home health came out this afternoon and suggested a sleep study. She said when they mentioned it, she remembered it had been mentioned in the hospital as well for nighttime O2 desaturations. She was asking if you could order this for patient.

## 2011-11-15 NOTE — Telephone Encounter (Signed)
Home health is supposed to be coming out to start PT, but they haven't come out as of yet. She will stay home until they think she is well enough to go back. Follow up scheduled.

## 2011-11-15 NOTE — Telephone Encounter (Signed)
D[facility]C from [facility] 11/10/2011. Does she have home health coming out - mainly for PT/OT - we did not discuss this at [facility] but I want to ensure getting home therapy.  Would also want to get home PT/OT opinion on adult daycare. I recommend she stay at home for now.  Would like her to come in 1 mo instead of 3 for closer f/u as well.

## 2011-11-15 NOTE — Telephone Encounter (Signed)
Treatment of OSA is weight loss and night time CPAP machine - does mom think Patrice would tolerate mask/machine?  if so, ok to proceed with referral. Placed referral in chart.  plz send this phone note along with referral.

## 2011-11-15 NOTE — Telephone Encounter (Signed)
Noted. Thanks.

## 2011-11-15 NOTE — Telephone Encounter (Signed)
Patient's mother called and asked if you thought she should go back to her adult daycare yet or wait until she is a little better and more ambulatory.

## 2011-11-16 NOTE — Telephone Encounter (Signed)
Message left for patient's mother to return my call.

## 2011-11-19 ENCOUNTER — Encounter: Payer: Self-pay | Admitting: Family Medicine

## 2011-11-28 NOTE — Telephone Encounter (Signed)
Message left for patient's mother to return my call.

## 2011-11-28 NOTE — Telephone Encounter (Signed)
Spoke with patient's mother. She advised she already had a sleep study scheduled by the hospital, so she will go ahead and have that done. She's not sure about her tolerating the mask, so she will just see what the results of the sleep study are before taking things further. She is already working on weight loss.

## 2011-11-30 ENCOUNTER — Encounter: Payer: Self-pay | Admitting: Family Medicine

## 2011-11-30 DIAGNOSIS — G40909 Epilepsy, unspecified, not intractable, without status epilepticus: Secondary | ICD-10-CM

## 2011-11-30 DIAGNOSIS — Z8701 Personal history of pneumonia (recurrent): Secondary | ICD-10-CM

## 2011-11-30 DIAGNOSIS — R1310 Dysphagia, unspecified: Secondary | ICD-10-CM

## 2011-11-30 DIAGNOSIS — Q909 Down syndrome, unspecified: Secondary | ICD-10-CM

## 2011-12-05 ENCOUNTER — Telehealth: Payer: Self-pay

## 2011-12-05 ENCOUNTER — Encounter: Payer: Self-pay | Admitting: Family Medicine

## 2011-12-05 ENCOUNTER — Ambulatory Visit (INDEPENDENT_AMBULATORY_CARE_PROVIDER_SITE_OTHER): Payer: Medicare Other | Admitting: Family Medicine

## 2011-12-05 VITALS — BP 90/60 | HR 88 | Temp 98.3°F | Resp 20 | Ht 62.0 in | Wt 219.2 lb

## 2011-12-05 DIAGNOSIS — H6120 Impacted cerumen, unspecified ear: Secondary | ICD-10-CM

## 2011-12-05 DIAGNOSIS — R05 Cough: Secondary | ICD-10-CM | POA: Insufficient documentation

## 2011-12-05 DIAGNOSIS — Z23 Encounter for immunization: Secondary | ICD-10-CM

## 2011-12-05 DIAGNOSIS — Q909 Down syndrome, unspecified: Secondary | ICD-10-CM

## 2011-12-05 DIAGNOSIS — R059 Cough, unspecified: Secondary | ICD-10-CM

## 2011-12-05 MED ORDER — FUROSEMIDE 20 MG PO TABS: 20.0000 mg | ORAL_TABLET | Freq: Every day | ORAL | Status: DC | PRN

## 2011-12-05 MED ORDER — LEVOTHYROXINE SODIUM 112 MCG PO TABS: 112.0000 ug | ORAL_TABLET | Freq: Every day | ORAL | Status: DC

## 2011-12-05 MED ORDER — PANTOPRAZOLE SODIUM 40 MG PO TBEC: 40.0000 mg | DELAYED_RELEASE_TABLET | Freq: Every day | ORAL | Status: DC

## 2011-12-05 MED ORDER — MEMANTINE HCL 10 MG PO TABS: 10.0000 mg | ORAL_TABLET | Freq: Two times a day (BID) | ORAL | Status: DC

## 2011-12-05 MED ORDER — FLUOXETINE HCL 10 MG PO CAPS: 10.0000 mg | ORAL_CAPSULE | Freq: Two times a day (BID) | ORAL | Status: DC

## 2011-12-05 MED ORDER — LEVETIRACETAM 500 MG PO TABS: 500.0000 mg | ORAL_TABLET | Freq: Two times a day (BID) | ORAL | Status: DC

## 2011-12-05 NOTE — Patient Instructions (Addendum)
Ok to return to day center. Good to see you today, call us with questions. Keep an eye on foods at day center. Flu and pneumonia shots today.

## 2011-12-05 NOTE — Assessment & Plan Note (Signed)
Anticipate more due to irritation from healing trache. Reassured.  No evidence of infection currently. Continue to monitor.  Red flags to be concerned about infx discussed. Flu/PNA shots today.

## 2011-12-05 NOTE — Addendum Note (Signed)
Addended by: Patience Musca on: 12/05/2011 10:03 AM   Modules accepted: Orders

## 2011-12-05 NOTE — Assessment & Plan Note (Signed)
To use debrox at home.

## 2011-12-05 NOTE — Progress Notes (Signed)
  Subjective:    Patient ID: [name], female    DOB: May 04, 1970, 41 y.o.   MRN: [id number]  HPI CC: cough  Seen here as new patient 11/13/2011.  Pleasant 41 yo with h/o [name], recent hospitalization for VDRF 2/2 PNA with empyema s/p L VATS by Dr. Cornelius Moras and then tracheostomy when failed wean from vent.  decannulated 11/06/2011.  Came home from [facility] facility 11/10/2011.  Presents today with mother.  Had a nagging cough, worse over the weekend, but present since hospitalization.  + hoarse voice, sore throat.  Over weekend some productive of sputum, but now back to dry cough.  States her throat hurts.  No fevers/chills, ear or tooth pain, HA, congestion or rhinorrhea.  No significant allergy, asthma hx.  No sick contacts at home.  No smokers at home.  HH has been D/C.  Would like to return to adult day center.   Sleep study scheduled for 10/20/013.  BP Readings from Last 3 Encounters:  12/05/11 90/60  11/13/11 124/72  10/12/11 94/45    Past Medical History  Diagnosis Date  . Down syndrome   . Pneumonia     multiple pneumonias  . Empyema of left pleural space 09/24/2011  . DJD (degenerative joint disease) of knee     bilat knee arthritis, had 3/3 Supartz injections [name])  . Depression     with OCD  . History of diabetes mellitus     during TPN  . Hypothyroid   . Seizure     x 2 during hospitalization  . Dementia due to another medical condition     Trisomy 21-related, sees [name] yearly    Review of Systems Per HPI    Objective:   Physical Exam  Nursing note and vitals reviewed. Constitutional: She appears well-developed and well-nourished. No distress.  HENT:  Head: Normocephalic and atraumatic.  Nose: Nose normal. No mucosal edema.  Mouth/Throat: Uvula is midline, oropharynx is clear and moist and mucous membranes are normal. No oropharyngeal exudate, posterior oropharyngeal edema, posterior oropharyngeal erythema or tonsillar abscesses.       Canals  filled with cerumen on left  Eyes: Conjunctivae normal and EOM are normal. Pupils are equal, round, and reactive to light. No scleral icterus.  Neck: Normal range of motion. Neck supple.  Cardiovascular: Normal rate, regular rhythm, normal heart sounds and intact distal pulses.   No murmur heard. Pulmonary/Chest: Effort normal and breath sounds normal. No respiratory distress. She has no wheezes. She has no rales.       No cough present  Lymphadenopathy:    She has no cervical adenopathy.  Skin: Skin is warm and dry. No rash noted.  Psychiatric: She has a normal mood and affect.       Assessment & Plan:

## 2011-12-05 NOTE — Telephone Encounter (Signed)
Pts mother Kriste Basque wanted to know if had heard from orthopedic dr about rt knee, Pt still has rt knee soreness and it hurts to walk.Please advise. [facility] [address]

## 2011-12-05 NOTE — Assessment & Plan Note (Signed)
pna and flu shots today. Ok to return to day center.

## 2011-12-06 NOTE — Telephone Encounter (Signed)
Message left advising patient's mother. Advised to call and let me know if she has tried aleve. If so and it didn't help I advised we would send in something different for her to try and if that was no help then we would refer to ortho. Will await return call.

## 2011-12-06 NOTE — Telephone Encounter (Signed)
I did receive records from ortho (Drs [name] and Margaretha Sheffield) and she did complete her series of 3 visco-supplement injections. Has she tried aleve as we discussed?  If not helping, we could do stronger anti inflammatory like naprosyn or pain medication like tramadol, but if that doesn't help then I recommend referral back to ortho.

## 2011-12-07 ENCOUNTER — Encounter: Payer: Self-pay | Admitting: Family Medicine

## 2011-12-12 ENCOUNTER — Telehealth: Payer: Self-pay

## 2011-12-12 NOTE — Telephone Encounter (Signed)
[name] request letter that it is OK for pt to take Aleve at day care with instructions of how and when to take med, pt's mother also request copy of letter already done for pt to return to daycare and med list along with letter [id number] faxed to [phone].Please advise.

## 2011-12-12 NOTE — Telephone Encounter (Signed)
Printed prior letter. Printed new letter. Placed in Kim's box.

## 2011-12-13 NOTE — Telephone Encounter (Signed)
Both letters and med list faxed as requested to daycare.

## 2011-12-17 ENCOUNTER — Ambulatory Visit (HOSPITAL_BASED_OUTPATIENT_CLINIC_OR_DEPARTMENT_OTHER): Payer: Medicare Other

## 2011-12-18 ENCOUNTER — Ambulatory Visit: Payer: Medicare Other | Admitting: Family Medicine

## 2011-12-25 ENCOUNTER — Institutional Professional Consult (permissible substitution): Payer: Medicare Other | Admitting: Pulmonary Disease

## 2011-12-27 ENCOUNTER — Encounter (INDEPENDENT_AMBULATORY_CARE_PROVIDER_SITE_OTHER): Payer: Medicare Other | Admitting: Ophthalmology

## 2011-12-27 DIAGNOSIS — H43819 Vitreous degeneration, unspecified eye: Secondary | ICD-10-CM

## 2011-12-27 DIAGNOSIS — H2510 Age-related nuclear cataract, unspecified eye: Secondary | ICD-10-CM

## 2011-12-27 DIAGNOSIS — H3090 Unspecified chorioretinal inflammation, unspecified eye: Secondary | ICD-10-CM

## 2012-01-17 ENCOUNTER — Ambulatory Visit (INDEPENDENT_AMBULATORY_CARE_PROVIDER_SITE_OTHER)
Admission: RE | Admit: 2012-01-17 | Discharge: 2012-01-17 | Disposition: A | Discharge status: Home / Self Care | Payer: Medicare Other | Source: Ambulatory Visit | Attending: Pulmonary Disease | Admitting: Pulmonary Disease

## 2012-01-17 ENCOUNTER — Encounter: Payer: Self-pay | Admitting: Pulmonary Disease

## 2012-01-17 ENCOUNTER — Ambulatory Visit (INDEPENDENT_AMBULATORY_CARE_PROVIDER_SITE_OTHER): Payer: Medicare Other | Admitting: Pulmonary Disease

## 2012-01-17 VITALS — BP 110/70 | HR 99 | Ht <= 58 in | Wt 221.2 lb

## 2012-01-17 DIAGNOSIS — R0609 Other forms of dyspnea: Secondary | ICD-10-CM

## 2012-01-17 DIAGNOSIS — R0989 Other specified symptoms and signs involving the circulatory and respiratory systems: Secondary | ICD-10-CM

## 2012-01-17 DIAGNOSIS — R0600 Dyspnea, unspecified: Secondary | ICD-10-CM

## 2012-01-17 DIAGNOSIS — R059 Cough, unspecified: Secondary | ICD-10-CM

## 2012-01-17 DIAGNOSIS — Z8701 Personal history of pneumonia (recurrent): Secondary | ICD-10-CM

## 2012-01-17 DIAGNOSIS — G4734 Idiopathic sleep related nonobstructive alveolar hypoventilation: Secondary | ICD-10-CM

## 2012-01-17 DIAGNOSIS — R05 Cough: Secondary | ICD-10-CM

## 2012-01-17 NOTE — Patient Instructions (Signed)
Will arrange for referral to Dr. [name] with ENT Chest xray today Will arrange for oxygen test at night Follow up in 3 months

## 2012-01-17 NOTE — Progress Notes (Signed)
Chief Complaint  Patient presents with  . Advice Only    pt was in hospital w/ PNA back in august/september-was on life support. has low oxygen sats at night. pt had left lung surgery over at [facility] in july 2013.    History of Present Illness: [name] is a 41 y.o. female for hospital follow up after recent treatment for pneumonia, empyema, and respiratory failure requiring tracheostomy.  Family was present to assist with interview.  She as discharged home from [facility] in September.  She was decannulated in [facility].  She has been doing well with her breathing and her activity.  She still has cough, but no sputum.  She feels like she has a constant tickle in her throat.  This has been present since she had tracheostomy.  She had tracheostomy placed by Dr. [name].  She denies snoring, and has been sleeping okay.  She did not need to go home with oxygen.  She has not had chest xray since September.  She denies chest pain, fever, or leg swelling.  Tests: Echo 10/09/11 >> EF 60 to 65%  Past Medical History  Diagnosis Date  . Down syndrome   . Pneumonia     multiple pneumonias  . Empyema of left pleural space 09/24/2011  . DJD (degenerative joint disease) of knee     bilat knee arthritis, had 3/3 Supartz injections [name])  . Depression     with OCD  . History of diabetes mellitus     during TPN  . Hypothyroid   . Seizure     x 2 during hospitalization  . Dementia due to another medical condition     Trisomy 21-related, sees [name] yearly, nl CT head 2010, did not tolerate aricept    Past Surgical History  Procedure Date  . Back surgery     ruptured disc  . Anterior cruciate ligament repair     left  . Flexible bronchoscopy 09/24/2011    Procedure: FLEXIBLE BRONCHOSCOPY;  Surgeon: Purcell Nails, MD;  Location: [facility] OR;  Service: Thoracic;  Laterality: N/A;  . Tracheostomy tube placement 10/11/2011    Procedure: TRACHEOSTOMY;  Surgeon: Serena Colonel, MD;  Location: [facility] OR;  Service:  ENT;  Laterality: N/A;  . Left vats     (Dr. Barry Dienes)    Current Outpatient Prescriptions on File Prior to Visit  Medication Sig Dispense Refill  . acidophilus (RISAQUAD) CAPS Take 1 capsule by mouth daily.      Marland Kitchen FLUoxetine (PROZAC) 10 MG capsule Take 1 capsule (10 mg total) by mouth 2 (two) times daily.  60 capsule  11  . levETIRAcetam (KEPPRA) 500 MG tablet Take 1 tablet (500 mg total) by mouth every 12 (twelve) hours.  60 tablet  11  . levothyroxine (SYNTHROID, LEVOTHROID) 112 MCG tablet Take 1 tablet (112 mcg total) by mouth daily before breakfast.  30 tablet  11  . memantine (NAMENDA) 10 MG tablet Take 1 tablet (10 mg total) by mouth 2 (two) times daily.  60 tablet  11  . pantoprazole (PROTONIX) 40 MG tablet Take 1 tablet (40 mg total) by mouth daily.  30 tablet  6    No Known Allergies  Family History  Problem Relation Age of Onset  . Arthritis Mother   . Hypertension Father   . Diabetes Father   . Cancer Neg Hx   . Coronary artery disease Neg Hx   . Stroke Neg Hx     History  Substance Use Topics  .  Smoking status: Never Smoker   . Smokeless tobacco: Never Used  . Alcohol Use: No    Review of Systems  Constitutional: Negative for fever, appetite change and unexpected weight change.  HENT: Negative for ear pain, congestion, sore throat, sneezing, trouble swallowing and dental problem.   Respiratory: Positive for cough. Negative for shortness of breath.   Cardiovascular: Negative for chest pain, palpitations and leg swelling.  Gastrointestinal: Negative for abdominal pain.  Musculoskeletal: Negative for joint swelling.  Skin: Negative for rash.  Neurological: Negative for headaches.  Psychiatric/Behavioral: Negative for dysphoric mood. The patient is not nervous/anxious.    Physical Exam: Filed Vitals:   01/17/12 1604  BP: 110/70  Pulse: 99  Height: 4\' 10"  (1.473 m)  Weight: 221 lb 3.2 oz (100.336 kg)  SpO2: 96%    Wt Readings from Last 3 Encounters:    01/17/12 221 lb 3.2 oz (100.336 kg)  12/05/11 219 lb 4 oz (99.451 kg)  11/13/11 213 lb 12 oz (96.956 kg)    Body mass index is 46.23 kg/(m^2).   General - No distress, Down's body habitus ENT - No sinus tenderness, no oral exudate, no LAN, enlarged tongue, scar over anterior neck, pupils equal/reactive, wears glasses Cardiac - s1s2 regular, no murmur, pulses symmetric Chest - No wheeze/rales/dullness, good air entry, normal respiratory excursion Back - No focal tenderness Abd - Soft, non-tender, no organomegaly, + bowel sounds Ext - No edema Neuro - Normal strength, cranial nerves intact Skin - No rashes Psych - Normal mood, and behavior.    Lab Results  Component Value Date   WBC 8.5 11/02/2011   HGB 11.2* 11/02/2011   HCT 34.7* 11/02/2011   MCV 101.2* 11/02/2011   PLT 278 11/02/2011    Lab Results  Component Value Date   CREATININE 1.06 11/02/2011   BUN 28* 11/02/2011   NA 137 11/02/2011   K 3.8 11/02/2011   CL 96 11/02/2011   CO2 30 11/02/2011    Lab Results  Component Value Date   ALT 41* 10/21/2011   AST 32 10/21/2011   ALKPHOS 51 10/21/2011   BILITOT 0.3 10/21/2011    Lab Results  Component Value Date   TSH 1.407 09/24/2011    BNP    Component Value Date/Time   PROBNP 486.8* 10/11/2011 0525      Assessment/Plan:  Coralyn Helling, MD [facility] Pager:  [phone] 01/17/2012, 4:23 PM

## 2012-01-17 NOTE — Progress Notes (Deleted)
  Subjective:    Patient ID: [name], female    DOB: April 21, 1970, 41 y.o.   MRN: [id number]  HPI    Review of Systems  Constitutional: Negative for fever, appetite change and unexpected weight change.  HENT: Negative for ear pain, congestion, sore throat, sneezing, trouble swallowing and dental problem.   Respiratory: Positive for cough. Negative for shortness of breath.   Cardiovascular: Negative for chest pain, palpitations and leg swelling.  Gastrointestinal: Negative for abdominal pain.  Musculoskeletal: Negative for joint swelling.  Skin: Negative for rash.  Neurological: Negative for headaches.  Psychiatric/Behavioral: Negative for dysphoric mood. The patient is not nervous/anxious.        Objective:   Physical Exam        Assessment & Plan:

## 2012-01-18 ENCOUNTER — Telehealth: Payer: Self-pay | Admitting: Pulmonary Disease

## 2012-01-18 NOTE — Assessment & Plan Note (Signed)
Clinically improved.  Will repeat chest xray to document clearing of previous infiltrates.

## 2012-01-18 NOTE — Assessment & Plan Note (Signed)
She reports persistent cough with throat irritation since tracheostomy placement.  Will arrange for evaluation with Dr. [name] to further assess.

## 2012-01-18 NOTE — Telephone Encounter (Signed)
01/17/2012  *RADIOLOGY REPORT*   Clinical Data: Cough, follow up pneumonia   CHEST - 2 VIEW   Comparison: 11/06/2011   Findings: The tracheostomy device and central line have been removed.  Improved aeration.  Mild perihilar interstitial prominence persists.  No effusion.  Heart size upper limits normal. Regional bones unremarkable. IMPRESSION:  1.  Improved aeration with mild residual perihilar interstitial opacities. 2.  Interval removal of support hardware.    Original Report Authenticated By: D. Andria Rhein, MD     Attempt to contact pt's mother to discuss results, but voicemail was full.  Will have my nurse inform pt that cxr looked better.  No change to current treatment plan.

## 2012-01-18 NOTE — Assessment & Plan Note (Signed)
She is certainly at risk for sleep disordered breathing.  Her mother is concerned that she would not be able to tolerate CPAP if she was found to have sleep apnea.  Will arrange for room air overnight oximetry to determine if she may benefit from nocturnal supplemental oxygen.

## 2012-01-19 NOTE — Telephone Encounter (Signed)
LMOMTCB X1 for pt mother

## 2012-01-22 NOTE — Telephone Encounter (Signed)
Pt's mom returning call can be reached at [phone].Raylene Everts

## 2012-01-22 NOTE — Telephone Encounter (Signed)
lmomtcb x1 

## 2012-01-22 NOTE — Telephone Encounter (Signed)
lmomtcb x2 for pt mother

## 2012-01-23 NOTE — Telephone Encounter (Signed)
I spoke with pt mother and is aware of results. She voiced her understanding and needed nothing further

## 2012-02-01 ENCOUNTER — Ambulatory Visit: Payer: Medicare Other | Admitting: Family Medicine

## 2012-02-01 ENCOUNTER — Emergency Department (HOSPITAL_COMMUNITY)
Admission: EM | Admit: 2012-02-01 | Discharge: 2012-02-01 | Discharge status: Against Medical Advice | Payer: Medicare Other | Attending: Emergency Medicine | Admitting: Emergency Medicine

## 2012-02-01 ENCOUNTER — Emergency Department (HOSPITAL_COMMUNITY): Payer: Medicare Other

## 2012-02-01 ENCOUNTER — Encounter (HOSPITAL_COMMUNITY): Payer: Self-pay | Admitting: Emergency Medicine

## 2012-02-01 ENCOUNTER — Telehealth: Payer: Self-pay

## 2012-02-01 DIAGNOSIS — R05 Cough: Secondary | ICD-10-CM | POA: Insufficient documentation

## 2012-02-01 DIAGNOSIS — R0602 Shortness of breath: Secondary | ICD-10-CM | POA: Insufficient documentation

## 2012-02-01 DIAGNOSIS — R059 Cough, unspecified: Secondary | ICD-10-CM | POA: Insufficient documentation

## 2012-02-01 NOTE — Telephone Encounter (Signed)
pts mother said day center called pt coughing a lot and having difficulty breathing; pts mother sent some one to pick up pt. Advised pts mother if difficulty breathing pt should go to ER for eval now. [name] did not want to take pt to ER. Dr Reece Agar said if just coughing he can check pt at office but any respiratory distress needs to go to ER.Advised pts mother as instructed. Pt just arrived home; pts mother said pt is not having any difficulty breathing and no respiratory distress. Pt to see Dr Reece Agar today at 4 pm. Advised pts mother if pt condition changed or worsened and pt was having difficulty breathing or any respiratory distress to take pt to ER instead of our office. Pts mother voiced understanding.

## 2012-02-01 NOTE — ED Notes (Signed)
Patient upset about wait time. Tried to explain delay. Patient requests to see charge RN. Charge RN, Christa, notified and is at triage seeing the patient.

## 2012-02-01 NOTE — ED Notes (Signed)
Pt c/o cough and SOB x several days; pt with hx of intubation with resp failure this year from PNA; pt O2 sats low when sleeping last night

## 2012-02-01 NOTE — Telephone Encounter (Signed)
Noted. Can we call tomorrow with an update?  thanks.

## 2012-02-01 NOTE — Telephone Encounter (Signed)
Carrie received call from pts sister and while enroute to our office pt began gagging so pts sister is taking her to ER for eval. Pts family to call back with update of pts condition after seen at ER.

## 2012-02-01 NOTE — ED Notes (Signed)
Spoke with pt mother due to wait time, explained several beds were opening up soon and that she would be moved soon.  Mother stated that pt could not wait any longer and they were leaving.

## 2012-02-02 ENCOUNTER — Ambulatory Visit (INDEPENDENT_AMBULATORY_CARE_PROVIDER_SITE_OTHER): Payer: Medicare Other | Admitting: Family Medicine

## 2012-02-02 ENCOUNTER — Encounter: Payer: Self-pay | Admitting: Family Medicine

## 2012-02-02 ENCOUNTER — Ambulatory Visit: Payer: Medicare Other | Admitting: Family Medicine

## 2012-02-02 VITALS — BP 124/78 | HR 84 | Temp 98.3°F | Wt 227.8 lb

## 2012-02-02 DIAGNOSIS — R05 Cough: Secondary | ICD-10-CM

## 2012-02-02 MED ORDER — GUAIFENESIN-CODEINE 100-10 MG/5ML PO SYRP: 5.0000 mL | ORAL_SOLUTION | Freq: Two times a day (BID) | ORAL | Status: DC | PRN

## 2012-02-02 NOTE — Patient Instructions (Signed)
I think [name] has a viral bronchitis.  Treat with cheratussin cough syrup. Let me know if fever >101, or worsening productive cough. Lots of rest and lots of fluids.

## 2012-02-02 NOTE — Assessment & Plan Note (Signed)
Anticipate viral bronchitis vs croup.  Nontoxic today.  Supportive care for now.  Treat cough with cheratussin.  Push fluids and rest.  Update [place] if sxs persistent or any worsening.  See pt instructions for plan.

## 2012-02-02 NOTE — Telephone Encounter (Signed)
Patient coming in for appt today.

## 2012-02-02 NOTE — Progress Notes (Signed)
  Subjective:    Patient ID: [name], female    DOB: 1970-03-04, 41 y.o.   MRN: [id number]  HPI CC: cough  Presents with mom.  See yesterday's phone note.  Seen at ER yesterday, but note not complete.   I see pt had CXR done there - low lung volumes but no acute CP process.  Actually waited 5 hours and decided to leave.    Coup virus going through house - aunt sick as well - down's syndrome.  Cough present for last 3 days.  Has been sent home x 2 from adult daycare 2/2 cough.  Dry hoarse cough.  Chest and abd sore with coughing.  + SOB some.  No fevers.  No congestion, HA, rhinorrhea.  Treating with multisymptom mucinex.  Saw Dr. Craige Cotta - has been set up with ONO, but occurred while she was sick.  No dx asthma. No smokers at home. NKDA.  Past Medical History  Diagnosis Date  . Down syndrome   . Pneumonia     multiple pneumonias  . Empyema of left pleural space 09/24/2011  . DJD (degenerative joint disease) of knee     bilat knee arthritis, had 3/3 Supartz injections [name])  . Depression     with OCD  . History of diabetes mellitus     during TPN  . Hypothyroid   . Seizure     x 2 during hospitalization  . Dementia due to another medical condition     Trisomy 21-related, sees [name] yearly, nl CT head 2010, did not tolerate aricept     Review of Systems Per HPI    Objective:   Physical Exam  Nursing note and vitals reviewed. Constitutional: She appears well-developed and well-nourished. No distress.  HENT:  Head: Normocephalic and atraumatic.  Right Ear: Hearing, tympanic membrane, external ear and ear canal normal.  Left Ear: Hearing, tympanic membrane, external ear and ear canal normal.  Nose: No mucosal edema or rhinorrhea. Right sinus exhibits no maxillary sinus tenderness and no frontal sinus tenderness. Left sinus exhibits no maxillary sinus tenderness and no frontal sinus tenderness.  Mouth/Throat: Uvula is midline, oropharynx is clear and moist and mucous  membranes are normal. No oropharyngeal exudate, posterior oropharyngeal edema, posterior oropharyngeal erythema or tonsillar abscesses.  Eyes: Conjunctivae normal and EOM are normal. Pupils are equal, round, and reactive to light. No scleral icterus.  Neck: Normal range of motion. Neck supple.  Cardiovascular: Normal rate, regular rhythm, normal heart sounds and intact distal pulses.   No murmur heard. Pulmonary/Chest: Effort normal and breath sounds normal. No respiratory distress. She has no wheezes. She has no rales.       Hoarse dry cough present  Lymphadenopathy:    She has no cervical adenopathy.  Skin: Skin is warm and dry. No rash noted.       Assessment & Plan:

## 2012-02-14 ENCOUNTER — Ambulatory Visit (INDEPENDENT_AMBULATORY_CARE_PROVIDER_SITE_OTHER)
Admission: RE | Admit: 2012-02-14 | Discharge: 2012-02-14 | Disposition: A | Discharge status: Home / Self Care | Payer: Medicare Other | Source: Ambulatory Visit | Attending: Family Medicine | Admitting: Family Medicine

## 2012-02-14 ENCOUNTER — Encounter: Payer: Self-pay | Admitting: Family Medicine

## 2012-02-14 ENCOUNTER — Ambulatory Visit (INDEPENDENT_AMBULATORY_CARE_PROVIDER_SITE_OTHER): Payer: Medicare Other | Admitting: Family Medicine

## 2012-02-14 VITALS — BP 126/72 | HR 81 | Temp 97.6°F | Wt 227.8 lb

## 2012-02-14 DIAGNOSIS — M25571 Pain in right ankle and joints of right foot: Secondary | ICD-10-CM

## 2012-02-14 DIAGNOSIS — R059 Cough, unspecified: Secondary | ICD-10-CM

## 2012-02-14 DIAGNOSIS — G8929 Other chronic pain: Secondary | ICD-10-CM | POA: Insufficient documentation

## 2012-02-14 DIAGNOSIS — M25579 Pain in unspecified ankle and joints of unspecified foot: Secondary | ICD-10-CM

## 2012-02-14 DIAGNOSIS — R05 Cough: Secondary | ICD-10-CM

## 2012-02-14 MED ORDER — AZITHROMYCIN 250 MG PO TABS: ORAL_TABLET | ORAL | Status: DC

## 2012-02-14 NOTE — Assessment & Plan Note (Addendum)
Persistent cough in setting of h/o recurrent PNA earlier this year with empyema. No evidence of return of empyema, no fevers, currently dry cough. Given persistence of cough and hx PNA, will cover for atypical bronchitis with zpack. Continue cheratussin prn cough at night. Has already received flu and PNA shots this year.

## 2012-02-14 NOTE — Assessment & Plan Note (Addendum)
given difficulty obtaining hx 2/2 [name], as well as some pain lateral posterior malleolus, will check xray today to r/o fracture - negative for fracture.  Read as mild DJD of talonavicular joint. Discussed findings with pt and father - anticipate low grade right lateral ankle sprain. Attempted to place in ASO brace, did not fit foot 2/2 edema.  Wrapped foot in ace bandage instead. Provided with stretching exercises from SM pt advisor on ankle strains. rec continue tylenol/aleve for discomfort.

## 2012-02-14 NOTE — Patient Instructions (Addendum)
For ankle - xray today - no fracture.  She has right ankle sprain.  Treat with ASO brace as needed when walking, aleve and tylenol, start stretching exercises once feeling better. For cough - lungs clear today.  Given length of cough, I do want to treat this with course of antibiotic for possible developing bronchitis - zpack.  Let us know if not resolving in next 1-2 weeks.

## 2012-02-14 NOTE — Progress Notes (Signed)
  Subjective:    Patient ID: [name], female    DOB: [birth year], 41 y.o.   MRN: [id number]  HPI CC: cough, ankle pain  Presents with father today.  See prior note for details.  Seen here 02/02/2012 with dry cough, dx was viral bronchitis vs croup, treated with supportive care and rest.  Cough has continued, worse at night - continuous hacking.  cheratussin has helped her rest at night.  No fevers/chills, abd pain, chest pain.  No h/o asthma.  No smokers at home.   R ankle pain - twisted ankle when walking off front porch 2 days ago.  Carlyle Lipa for this which has helped.  H/o knee pain, stable currently.  Past Medical History  Diagnosis Date  . Down syndrome   . Pneumonia     multiple pneumonias  . Empyema of left pleural space 09/24/2011  . DJD (degenerative joint disease) of knee     bilat knee arthritis, had 3/3 Supartz injections [name])  . Depression     with OCD  . History of diabetes mellitus     during TPN  . Hypothyroid   . Seizure     x 2 during hospitalization  . Dementia due to another medical condition     Trisomy 21-related, sees [name] yearly, nl CT head 2010, did not tolerate aricept     Review of Systems Per HPI    Objective:   Physical Exam  Nursing note and vitals reviewed. Constitutional: She appears well-developed and well-nourished. No distress.  HENT:  Head: Normocephalic and atraumatic.  Right Ear: Hearing, tympanic membrane, external ear and ear canal normal.  Left Ear: Hearing, tympanic membrane, external ear and ear canal normal.  Nose: No mucosal edema or rhinorrhea. Right sinus exhibits no maxillary sinus tenderness and no frontal sinus tenderness. Left sinus exhibits no maxillary sinus tenderness and no frontal sinus tenderness.  Mouth/Throat: Uvula is midline, oropharynx is clear and moist and mucous membranes are normal. No oropharyngeal exudate, posterior oropharyngeal edema, posterior oropharyngeal erythema or tonsillar abscesses.       mlid nasal crusting  Eyes: Conjunctivae normal and EOM are normal. Pupils are equal, round, and reactive to light. No scleral icterus.  Neck: Normal range of motion. Neck supple.  Cardiovascular: Normal rate, regular rhythm, normal heart sounds and intact distal pulses.   No murmur heard. Pulmonary/Chest: Effort normal and breath sounds normal. No respiratory distress. She has no wheezes. She has no rales.       Rare dry cough present  Musculoskeletal:       L ankle WNL R ankle - good ROM, tender at lat ankle ligaments with inversion of ankle as well as with palpation of ankle ligaments.  No laxity noted.  No pain with talar tilt test or with lower extremity squeeze test.   No R knee pain 2+ DP bilaterally Tender with percussion of posterior lateral malleolus  Lymphadenopathy:    She has no cervical adenopathy.  Skin: Skin is warm and dry. No rash noted.   On recheck, O2 sat 94-98%, averaging around 96%.    Assessment & Plan:

## 2012-02-15 ENCOUNTER — Telehealth: Payer: Self-pay | Admitting: Pulmonary Disease

## 2012-02-15 NOTE — Telephone Encounter (Signed)
ONO with RA 01/31/12>>Test time 6 hrs 1 min.  Mean SpO2 82%, low SpO2 59%.  Spent 4 hrs 16 min with SpO2 < 88%.  Discussed results with pt's mother.  She states [name] had a respiratory infection when test was done, and she has not fully recovered from this.  She still is on Abx through her PCP.  She does not think test results were valid because of respiratory infection, and would like this repeated when she is feeling better.  Advised [name] to call our office back with [name] is recovered from respiratory infection, and will repeat ONO on room air.

## 2012-02-19 ENCOUNTER — Ambulatory Visit: Payer: Medicare Other | Admitting: Family Medicine

## 2012-02-19 DIAGNOSIS — Z0289 Encounter for other administrative examinations: Secondary | ICD-10-CM

## 2012-03-12 ENCOUNTER — Telehealth: Payer: Self-pay | Admitting: Pulmonary Disease

## 2012-03-12 DIAGNOSIS — Q909 Down syndrome, unspecified: Secondary | ICD-10-CM

## 2012-03-12 DIAGNOSIS — G4734 Idiopathic sleep related nonobstructive alveolar hypoventilation: Secondary | ICD-10-CM

## 2012-03-12 NOTE — Telephone Encounter (Signed)
Per phone note from December Dr. Marthe Patch were as follows: Advised [name] to call our office back with [place] is recovered from respiratory infection, and will repeat ONO on room air.  Pt mother called back and states the pt is cleared of the resp infection and would like to have ONO repeated. Order placed. Carron Curie, CMA

## 2012-03-20 ENCOUNTER — Telehealth: Payer: Self-pay | Admitting: Pulmonary Disease

## 2012-03-20 NOTE — Telephone Encounter (Signed)
I spoke with mother and looks like schedulers were calling to schedule pt 3 month appt. This has been done. Nothing further was needed

## 2012-03-21 ENCOUNTER — Telehealth: Payer: Self-pay | Admitting: Pulmonary Disease

## 2012-03-21 ENCOUNTER — Encounter: Payer: Self-pay | Admitting: Pulmonary Disease

## 2012-03-21 DIAGNOSIS — E669 Obesity, unspecified: Secondary | ICD-10-CM

## 2012-03-21 NOTE — Telephone Encounter (Signed)
ONO with RA 03/15/12 >> Test time 10 hrs 15 min.  Mean SpO2 84%, low SpO2 59%.  Spent 6 hrs 6 min with SpO2 < 88%.  Left message detailing that pt has significant oxygen desaturation while asleep.  Options would be to arrange for sleep study to assess for OSA versus setting up 2 liters oxygen with sleep and repeating ONO.  Asked pt's mother to return call to discuss options.  Will forward message to my nurse to be aware of conversation.

## 2012-03-21 NOTE — Telephone Encounter (Signed)
Pt's mom returned call. She states that number VS called is the good # to call (she had stepped out for only a moment). [name]

## 2012-03-22 NOTE — Telephone Encounter (Signed)
I spoke with Mother and she stated she think pt would do better with the oxygen rather than the equipment that you have to wear for OSA. Please advise VS thanks

## 2012-03-25 NOTE — Telephone Encounter (Signed)
Please inform pt's mother that I will arrange for 2 liters oxygen at night, and repeat ONO with 2 liters.  I have placed order for oxygen set up.

## 2012-03-26 ENCOUNTER — Telehealth: Payer: Self-pay | Admitting: Pulmonary Disease

## 2012-03-26 NOTE — Telephone Encounter (Signed)
lmomtcb x1 

## 2012-03-26 NOTE — Telephone Encounter (Signed)
I spoke with Melissa and gave OK to use DX. Nothing further was needed.

## 2012-03-26 NOTE — Telephone Encounter (Signed)
Please advise if okay to use dx Dr. Craige Cotta thanks

## 2012-03-26 NOTE — Telephone Encounter (Signed)
That's fine to change dx code.

## 2012-03-27 NOTE — Telephone Encounter (Signed)
Mother aware. Nothing further was needed

## 2012-04-04 ENCOUNTER — Encounter: Payer: Self-pay | Admitting: Pulmonary Disease

## 2012-04-22 ENCOUNTER — Telehealth: Payer: Self-pay | Admitting: Pulmonary Disease

## 2012-04-22 DIAGNOSIS — E669 Obesity, unspecified: Secondary | ICD-10-CM

## 2012-04-22 NOTE — Telephone Encounter (Signed)
ONO with RA 01/31/12>>Test time 6 hrs 1 min. Mean SpO2 82%, low SpO2 59%. Spent 4 hrs 16 min with SpO2 < 88%.  ONO with 2L 04/02/12 >> Test time 7 hrs 53 min.  Mean SpO2 92.4%, low SpO2 71%.  Spent 1 hr 36 min with SpO2 < 88%.  Will have my nurse inform patient that oxygen test looks better, but oxygen still low at night.  Will increase oxygen to 3 liters and repeat ONO.

## 2012-04-23 ENCOUNTER — Ambulatory Visit: Payer: Medicare Other | Admitting: Pulmonary Disease

## 2012-04-23 NOTE — Telephone Encounter (Signed)
Mother is aware of results. Nothing further was needed

## 2012-05-08 ENCOUNTER — Telehealth: Payer: Self-pay | Admitting: Pulmonary Disease

## 2012-05-08 NOTE — Telephone Encounter (Signed)
ONO with RA 01/31/12>>Test time 6 hrs 1 min. Mean SpO2 82%, low SpO2 59%. Spent 4 hrs 16 min with SpO2 < 88%.   ONO with 2L 04/02/12 >> Test time 7 hrs 53 min. Mean SpO2 92.4%, low SpO2 71%. Spent 1 hr 36 min with SpO2 < 88%.  ONO with 3 liters 05/03/12 >> Test time 7 hrs 57 min.  Mean SpO2 93.2%, low SpO2 65%.  Spent 1 hr 18 min with SpO2 < 88%.  Left message for pt's mother explaining that there is no significant improvement from 2 to 3 liters, and much of her residual hypoxia is related to probable untreated sleep apnea.  Asked for her to call back to discuss in more detail.

## 2012-05-09 ENCOUNTER — Telehealth: Payer: Self-pay | Admitting: Pulmonary Disease

## 2012-05-09 DIAGNOSIS — G4736 Sleep related hypoventilation in conditions classified elsewhere: Secondary | ICD-10-CM

## 2012-05-09 DIAGNOSIS — G4733 Obstructive sleep apnea (adult) (pediatric): Secondary | ICD-10-CM

## 2012-05-09 NOTE — Telephone Encounter (Signed)
Discussed results of ONO with pts mother.  She would like to try CPAP if needed.  She is having snoring, sleep disruption, and daytime sleepiness.  Will arrange for sleep study and call her with results.

## 2012-05-28 DIAGNOSIS — G4733 Obstructive sleep apnea (adult) (pediatric): Secondary | ICD-10-CM

## 2012-05-28 HISTORY — DX: Obstructive sleep apnea (adult) (pediatric): G47.33

## 2012-06-02 ENCOUNTER — Ambulatory Visit (HOSPITAL_BASED_OUTPATIENT_CLINIC_OR_DEPARTMENT_OTHER): Payer: Medicare Other | Attending: Pulmonary Disease

## 2012-06-02 VITALS — Ht <= 58 in | Wt 239.0 lb

## 2012-06-02 DIAGNOSIS — G4733 Obstructive sleep apnea (adult) (pediatric): Secondary | ICD-10-CM

## 2012-06-02 DIAGNOSIS — Z9989 Dependence on other enabling machines and devices: Secondary | ICD-10-CM

## 2012-06-02 DIAGNOSIS — G4736 Sleep related hypoventilation in conditions classified elsewhere: Secondary | ICD-10-CM

## 2012-06-04 ENCOUNTER — Telehealth: Payer: Self-pay | Admitting: Pulmonary Disease

## 2012-06-04 DIAGNOSIS — G4733 Obstructive sleep apnea (adult) (pediatric): Secondary | ICD-10-CM

## 2012-06-04 NOTE — Telephone Encounter (Signed)
PSG 06/02/12 >> AHI 118.3, SpO2 low 67%.  CPAP 11 cm H2O >> better, but difficulty using mask.  Did not need supplemental oxygen with CPAP.  Attempted to call pt's mother to discuss results >> voicemail box full.  Will have my nurse schedule ROV to review results.  Okay to double book visit.

## 2012-06-04 NOTE — Telephone Encounter (Signed)
Pt scheduled for 4/10.[name]

## 2012-06-05 ENCOUNTER — Encounter: Payer: Self-pay | Admitting: Family Medicine

## 2012-06-05 NOTE — Procedures (Signed)
NAMEELIZABETHANNE, LUSHER NO.:  [id number]  MEDICAL RECORD NO.:  [id number]          PATIENT TYPE:  OUT  LOCATION:  SLEEP CENTER                 FACILITY:  [facility]  PHYSICIAN:  Coralyn Helling, MD        DATE OF BIRTH:  [birth year]  DATE OF STUDY:  06/02/2012                           NOCTURNAL POLYSOMNOGRAM  REFERRING PHYSICIAN:  Coralyn Helling, MD  FACILITY:  [facility].  REFERRING PHYSICIAN:  Coralyn Helling, MD  INDICATION:  Mr. Adan is a 42 year old female, who has history of Down syndrome, depression, and seizure disorder.  She also has snoring, sleep disruption, and daytime sleepiness.  She was noted to have significant oxygen desaturations while asleep that would not adequately controlled with nocturnal supplemental oxygen.  She is referred to the sleep lab for evaluation of hypersomnia with obstructive sleep apnea.  Height is 4 feet 10 inches, weight is 239 pounds, BMI is 50,  neck size is 16.  Medications are Prozac, Namenda, and Synthroid.  EPWORTH SLEEPINESS SCORE:  6.  SLEEP ARCHITECTURE:  The patient followed a split night study protocol. During the diagnostic portion of the study, the total recording time was 155 minutes.  Total sleep time was 140 minutes.  Sleep efficiency was 90%, sleep latency is 4.5 minutes.  This portion of study was notable for lack of slow-wave sleep and she slept exclusively in the supine position.  During the titration portion of study, the total recording time was 203 minutes.  Total sleep time was 83 minutes.  Sleep efficiency was 40%. Sleep latency was 1 minute.  This portion of study was notable for lack of slow-wave sleep and she slept predominantly in the nonsupine position.  RESPIRATORY DATA:  The average respiratory rate was 16.  The overall apnea/hypopnea index was 118.  The events were exclusively obstructive in nature.  Moderate snoring was noted by the technician.  During the titration portion of study, the  patient was started on CPAP of 4 and increased to 13 cm of water.  With CPAP at 11 cm of water, she appeared to have improvement in her sleep-disordered breathing. However, she had considerable difficulty with sleep maintenance and difficulty tolerating the CPAP mask.  OXYGEN DATA:  The baseline oxygenation was 90%.  The oxygen saturation nadir was 67%.  The patient had good control of oxygenation with CPAP of 11 cm of water.  She did not require the use of oxygen during the study.  CARDIAC DATA:  The average heart rate was 66 and the rhythm strip showed sinus rhythm.  MOVEMENT/PARASOMNIA:  The patient had 1 restroom trip and the periodic limb movement index was 0.  IMPRESSION:  This study shows evidence for severe obstructive sleep apnea with an apnea/hypopnea index of 118.3 with oxygen saturation nadir of 67%.  She appeared to have reasonable control of her sleep-disordered breathing with CPAP at 11 cm water.  However, she had difficulty tolerating CPAP mask and this contributed to sleep disruption.  Of note is that she did not require the use of supplemental oxygen when she was on adequate pressure settings for her CPAP.  In addition to diet, exercise, and  weight reduction, I would recommend the patient be started on CPAP of 11 cm water and monitored for her clinical response.     Coralyn Helling, MD Diplomat, American Board of Sleep Medicine    VS/MEDQ  D:  06/04/2012 16:07:00  T:  06/05/2012 02:34:31  Job:  [id number]

## 2012-06-06 ENCOUNTER — Ambulatory Visit (INDEPENDENT_AMBULATORY_CARE_PROVIDER_SITE_OTHER): Payer: Medicare Other | Admitting: Pulmonary Disease

## 2012-06-06 ENCOUNTER — Encounter: Payer: Self-pay | Admitting: Pulmonary Disease

## 2012-06-06 VITALS — BP 106/62 | HR 61 | Temp 97.0°F | Ht <= 58 in | Wt 239.0 lb

## 2012-06-06 DIAGNOSIS — G4733 Obstructive sleep apnea (adult) (pediatric): Secondary | ICD-10-CM

## 2012-06-06 NOTE — Progress Notes (Signed)
  Chief Complaint  Patient presents with  . Follow-up    pt here to discuss results.     History of Present Illness: [name] is a 42 y.o. female with severe OSA.  She has prior hx of pneumonia, empyema, and respiratory failure requiring tracheostomy.  She is her with her mother to review sleep study.   Tests: Echo 10/09/11 >> EF 60 to 65% ONO with RA 01/31/12>>Test time 6 hrs 1 min.  Mean SpO2 82%, low SpO2 59%.  Spent 4 hrs 16 min with SpO2 < 88%. ONO with 2L 04/02/12 >> Test time 7 hrs 53 min.  Mean SpO2 92.4%, low SpO2 71%.  Spent 1 hr 36 min with SpO2 < 88%. ONO with 3 liters 05/03/12 >> Test time 7 hrs 57 min.  Mean SpO2 93.2%, low SpO2 65%.  Spent 1 hr 18 min with SpO2 < 88%. PSG 06/02/12 >> AHI 118.3, SpO2 low 67%.  CPAP 11 cm H2O >> better, but difficulty using mask.  Did not need supplemental oxygen with CPAP.  She  has a past medical history of Down syndrome; Pneumonia; Empyema of left pleural space (09/24/2011); DJD (degenerative joint disease) of knee; Depression; History of diabetes mellitus; Hypothyroid; Seizure; Dementia due to another medical condition; and OSA on CPAP (05/2012).  She  has past surgical history that includes Back surgery; Anterior cruciate ligament repair; Flexible bronchoscopy (09/24/2011); Tracheostomy tube placement (10/11/2011); and left VATS.   Current Outpatient Prescriptions on File Prior to Visit  Medication Sig Dispense Refill  . FLUoxetine (PROZAC) 10 MG capsule Take 1 capsule (10 mg total) by mouth 2 (two) times daily.  60 capsule  11  . levETIRAcetam (KEPPRA) 500 MG tablet Take 1 tablet (500 mg total) by mouth every 12 (twelve) hours.  60 tablet  11  . levothyroxine (SYNTHROID, LEVOTHROID) 112 MCG tablet Take 1 tablet (112 mcg total) by mouth daily before breakfast.  30 tablet  11  . memantine (NAMENDA) 10 MG tablet Take 1 tablet (10 mg total) by mouth 2 (two) times daily.  60 tablet  11  . Multiple Vitamins-Minerals (WOMENS MULTI PO) Take by  mouth daily.      . pantoprazole (PROTONIX) 40 MG tablet Take 1 tablet (40 mg total) by mouth daily.  30 tablet  6   No current facility-administered medications on file prior to visit.    No Known Allergies   Physical Exam:  General - No distress, Down's body habitus ENT - No sinus tenderness, no oral exudate, no LAN, enlarged tongue, scar over anterior neck, wears glasses Cardiac - s1s2 regular, no murmur Chest - No wheeze/rales/dullness, good air entry, normal respiratory excursion Back - No focal tenderness Abd - Soft, non-tender Ext - No edema Neuro - Normal strength Skin - No rashes Psych - Normal mood, and behavior   Assessment/Plan:  Coralyn Helling, MD  Pulmonary/Critical Care/Sleep Pager:  [phone] 06/06/2012, 10:37 AM

## 2012-06-06 NOTE — Patient Instructions (Signed)
Will arrange for CPAP set up at home Will get oxygen test while wearing CPAP Follow up in 2 months

## 2012-06-06 NOTE — Assessment & Plan Note (Signed)
She has severe OSA.  I have reviewed the recent sleep study results with the patient and her mother.  We discussed how sleep apnea can affect various health problems including risks for hypertension, cardiovascular disease, and diabetes.  Weight loss as a means of improving sleep apnea was also reviewed.  Additional treatment options discussed were CPAP therapy, oral appliance, and surgical intervention.  Will arrange for Auto CPAP set up.  Will get ONO with CPAP and room air, and then decide if she needs to continue using supplemental oxygen.

## 2012-06-26 ENCOUNTER — Ambulatory Visit (INDEPENDENT_AMBULATORY_CARE_PROVIDER_SITE_OTHER): Payer: Medicare Other | Admitting: Ophthalmology

## 2012-06-26 DIAGNOSIS — H3090 Unspecified chorioretinal inflammation, unspecified eye: Secondary | ICD-10-CM

## 2012-06-26 DIAGNOSIS — H2510 Age-related nuclear cataract, unspecified eye: Secondary | ICD-10-CM

## 2012-06-26 DIAGNOSIS — H43819 Vitreous degeneration, unspecified eye: Secondary | ICD-10-CM

## 2012-06-28 ENCOUNTER — Telehealth: Payer: Self-pay | Admitting: *Deleted

## 2012-06-28 NOTE — Telephone Encounter (Signed)
Message left for patient's mother to return my call and advise if she would like eval by RN through [facility].

## 2012-07-05 ENCOUNTER — Telehealth: Payer: Self-pay | Admitting: Pulmonary Disease

## 2012-07-05 DIAGNOSIS — G4734 Idiopathic sleep related nonobstructive alveolar hypoventilation: Secondary | ICD-10-CM

## 2012-07-05 DIAGNOSIS — G4733 Obstructive sleep apnea (adult) (pediatric): Secondary | ICD-10-CM

## 2012-07-05 NOTE — Telephone Encounter (Signed)
LMTCB for the pt 

## 2012-07-05 NOTE — Telephone Encounter (Signed)
I have not called pt. I looked like may be pt's pcp

## 2012-07-05 NOTE — Telephone Encounter (Signed)
mindy please advise if you have called the pt on any results.  i dont see anything in the system.  thanks

## 2012-07-08 NOTE — Telephone Encounter (Signed)
lmomtcb for pt 

## 2012-07-09 NOTE — Telephone Encounter (Signed)
Called spoke with patient's mother Lenetta Quaker, they have not heard from our office regarding pt's ONO on RA >> order was placed at 4.10.14 ov w/ VS Per the sleep study, pt does not desat with CPAP alone [name] stated that pt is feeling like she needs her O2 at bedtime and is requesting this No sign of ONO results in pt's chart Done thru [facility] - will call for results and cal [name] back once VS has reviewed [name] okay with this and verbalized her understanding  Called [facility] spoke with [place] who stated that ONO was faxed on 5.6.14 Requested this be faxed again to triage fax @ [phone] - Gershon Cull will have to email RT to have this done Will forward to Mindy to look for these results

## 2012-07-09 NOTE — Telephone Encounter (Signed)
Results received and placed in VS look at folder. Please advise Dr. Craige Cotta thanks

## 2012-07-11 ENCOUNTER — Telehealth: Payer: Self-pay | Admitting: Family Medicine

## 2012-07-11 NOTE — Telephone Encounter (Signed)
Filled out [facility] physical form and placed in Kim's box.

## 2012-07-11 NOTE — Telephone Encounter (Signed)
Forms faxed

## 2012-07-11 NOTE — Telephone Encounter (Signed)
ONO with CPAP and RA 06/21/12 >> Test time 9 hrs 30 min.  Basal SpO2 92.9%, low SpO2 66%.  Spent 17.7 min with SpO2 < 88%.  Will have my nurse inform pt's mother that ONO with CPAP looks better, but she is still having low oxygen.  Will arrange to use 1 liter oxygen with CPAP at night, and repeat ONO.

## 2012-07-12 ENCOUNTER — Ambulatory Visit: Payer: Medicare (Managed Care) | Admitting: Family Medicine

## 2012-07-12 NOTE — Telephone Encounter (Signed)
LMTCB

## 2012-07-15 NOTE — Telephone Encounter (Signed)
Pt mother is aware of results and order has been placed.Carron Curie, CMA

## 2012-07-16 ENCOUNTER — Ambulatory Visit (INDEPENDENT_AMBULATORY_CARE_PROVIDER_SITE_OTHER): Payer: Medicare (Managed Care) | Admitting: Family Medicine

## 2012-07-16 ENCOUNTER — Encounter: Payer: Self-pay | Admitting: Family Medicine

## 2012-07-16 VITALS — BP 124/78 | HR 76 | Temp 98.1°F | Wt 237.8 lb

## 2012-07-16 DIAGNOSIS — R7303 Prediabetes: Secondary | ICD-10-CM

## 2012-07-16 DIAGNOSIS — E669 Obesity, unspecified: Secondary | ICD-10-CM

## 2012-07-16 DIAGNOSIS — E1059 Type 1 diabetes mellitus with other circulatory complications: Secondary | ICD-10-CM

## 2012-07-16 DIAGNOSIS — E039 Hypothyroidism, unspecified: Secondary | ICD-10-CM

## 2012-07-16 DIAGNOSIS — R7309 Other abnormal glucose: Secondary | ICD-10-CM

## 2012-07-16 DIAGNOSIS — Z8639 Personal history of other endocrine, nutritional and metabolic disease: Secondary | ICD-10-CM

## 2012-07-16 DIAGNOSIS — F329 Major depressive disorder, single episode, unspecified: Secondary | ICD-10-CM

## 2012-07-16 NOTE — Assessment & Plan Note (Addendum)
Lab Results  Component Value Date   HGBA1C 5.9* 09/24/2011  recheck A1c today.  Weight gain noted. Discussed low carb/sugar diet.

## 2012-07-16 NOTE — Assessment & Plan Note (Signed)
Discussed low carb/low sugar diet. Discussed increased activity. Encouraged 1 fruit or vegetable with each meal and encouraged to avoid sodas, increase water. Given associated hirsutism in setting of recent weight gain, consider checking 8am testosterone +/- pelvic US to eval PCOS, pending today's blood work.

## 2012-07-16 NOTE — Patient Instructions (Signed)
Try to avoid simple carbs and white starches like white bread, pasta, rice, potatoes.  Instead, whole grain bread, whole wheat pasta, brown rice, sweet potatoe. Increase water in diet, less soda/diet soda. Fruit/vegetable with each meal. Blood work today.  We will call you with results.

## 2012-07-16 NOTE — Assessment & Plan Note (Signed)
With increased irritability.  Mom has self tapered prozac to 20mg  bid.  Will give this a full 1 month to take effect and reassess.  Mom agrees with plan.

## 2012-07-16 NOTE — Assessment & Plan Note (Signed)
Check TSH today. Currently on 112 mcg dose.

## 2012-07-16 NOTE — Progress Notes (Signed)
Subjective:    Patient ID: [name], female    DOB: 03-17-1970, 42 y.o.   MRN: [id number]  HPI CC: med concerns  [name] presents with mom today.  Increased weight recently.  24 lb weight gain in last 8 months. Hair on chin worsening recently. Normal, regular cycles.  Over last 2 months increased irritability noted at school. Mom recently increased fluoxetine to 20mg  bid (prior on 10mg  twice daily.)  Has been on higher dose of prozac for last 2 weeks.  On levothyroxine daily.  Due for recheck. Lab Results  Component Value Date   TSH 1.407 09/24/2011   Lab Results  Component Value Date   HGBA1C 5.9* 09/24/2011   H/o severe OSA - on CPAP nightly. Activity: to start swimming at pool  Not currently going to gym.  24 hour recall: Breakfast - Cereal with milk at work. Lunch - spaghetti, water Snack - cookie and cheetos Dinner - vegetable lasagna and salad   Wt Readings from Last 3 Encounters:  07/16/12 237 lb 12 oz (107.843 kg)  06/06/12 239 lb (108.41 kg)  06/02/12 239 lb (108.41 kg)    Past Medical History  Diagnosis Date  . Down syndrome   . Pneumonia     multiple pneumonias  . Empyema of left pleural space 09/24/2011  . DJD (degenerative joint disease) of knee     bilat knee arthritis, had 3/3 Supartz injections [name])  . Depression     with OCD  . History of diabetes mellitus     during TPN  . Hypothyroid   . Seizure     x 2 during hospitalization  . Dementia due to another medical condition     Trisomy 21-related, sees [name] yearly, nl CT head 2010, did not tolerate aricept  . OSA on CPAP 05/2012    severe OSA with AHI 118, desat nadir to 67%    Current Outpatient Prescriptions on File Prior to Visit  Medication Sig Dispense Refill  . FLUoxetine (PROZAC) 10 MG capsule Take 1 capsule (10 mg total) by mouth 2 (two) times daily.  60 capsule  11  . levETIRAcetam (KEPPRA) 500 MG tablet Take 1 tablet (500 mg total) by mouth every 12 (twelve) hours.   60 tablet  11  . levothyroxine (SYNTHROID, LEVOTHROID) 112 MCG tablet Take 1 tablet (112 mcg total) by mouth daily before breakfast.  30 tablet  11  . memantine (NAMENDA) 10 MG tablet Take 1 tablet (10 mg total) by mouth 2 (two) times daily.  60 tablet  11  . Multiple Vitamins-Minerals (WOMENS MULTI PO) Take by mouth daily.      . pantoprazole (PROTONIX) 40 MG tablet Take 1 tablet (40 mg total) by mouth daily.  30 tablet  6   No current facility-administered medications on file prior to visit.    Review of Systems Per HPI    Objective:   Physical Exam  Nursing note and vitals reviewed. Constitutional: She appears well-developed and well-nourished. No distress.  obese  HENT:  Mouth/Throat: Oropharynx is clear and moist. No oropharyngeal exudate.  Neck: Normal range of motion. Neck supple. No thyromegaly present.  Cardiovascular: Normal rate, regular rhythm, normal heart sounds and intact distal pulses.   No murmur heard. Pulmonary/Chest: Effort normal and breath sounds normal. No respiratory distress. She has no wheezes. She has no rales.  Musculoskeletal: She exhibits no edema.  Lymphadenopathy:    She has no cervical adenopathy.  Skin: Skin is warm and dry. No  rash noted.  hirsute chin  Psychiatric:  Depressed, labile affect       Assessment & Plan:

## 2012-07-17 ENCOUNTER — Other Ambulatory Visit: Payer: Self-pay | Admitting: Family Medicine

## 2012-07-17 ENCOUNTER — Telehealth: Payer: Self-pay

## 2012-07-17 DIAGNOSIS — E288 Other ovarian dysfunction: Secondary | ICD-10-CM

## 2012-07-17 DIAGNOSIS — L680 Hirsutism: Secondary | ICD-10-CM

## 2012-07-17 LAB — TSH: TSH: 7.6 u[IU]/mL — ABNORMAL HIGH (ref 0.35–5.50)

## 2012-07-17 LAB — BASIC METABOLIC PANEL
CO2: 29 mEq/L (ref 19–32)
Chloride: 103 mEq/L (ref 96–112)
Creatinine, Ser: 1.2 mg/dL (ref 0.4–1.2)
Glucose, Bld: 85 mg/dL (ref 70–99)

## 2012-07-17 MED ORDER — LEVOTHYROXINE SODIUM 125 MCG PO TABS: 125.0000 ug | ORAL_TABLET | Freq: Every day | ORAL | Status: DC

## 2012-07-17 NOTE — Telephone Encounter (Signed)
Placed orders.  To come in for 8am testosterone check and if abnormal will schedule pelvic ultrasound.

## 2012-07-17 NOTE — Telephone Encounter (Signed)
[name] said pt seen 07/16/12; pts mother request lab test for POCS be added to lab test drawn yesterday; advised after Dr Reece Agar reviews lab test done on 07/16/12 he may order additional testing that lab would need to be done early AM (8AM testosterone and possible [place] of pelvis). [name] voiced understanding and will wait to hear lab results from 07/16/12.

## 2012-07-23 NOTE — Telephone Encounter (Signed)
Lab scheduled °

## 2012-07-24 ENCOUNTER — Other Ambulatory Visit: Payer: Self-pay | Admitting: Family Medicine

## 2012-07-24 ENCOUNTER — Telehealth: Payer: Self-pay | Admitting: Pulmonary Disease

## 2012-07-24 MED ORDER — PANTOPRAZOLE SODIUM 40 MG PO TBEC: 40.0000 mg | DELAYED_RELEASE_TABLET | Freq: Every day | ORAL | Status: DC

## 2012-07-24 NOTE — Telephone Encounter (Signed)
Auto-CPAP 06/10/12 to 07/09/12 >> Used on 28 of 30 nights with average 7 hrs 56 min.  Average AHI 3.6 with median CPAP 10 cm H2O and 95 th percentile CPAP 13 cm H2O.  Will have my nurse inform pt's mother that CPAP report looks very good.  She is to continue CPAP and 1 liter oxygen at night.  Will call back once ONO with CPAP and 1 liter reviewed.

## 2012-07-24 NOTE — Telephone Encounter (Signed)
[name] is aware that pt is continue O2 at night. Nothing further was needed.

## 2012-07-26 ENCOUNTER — Telehealth: Payer: Self-pay | Admitting: Pulmonary Disease

## 2012-07-26 NOTE — Telephone Encounter (Signed)
ONO with CPAP and 1 liter 07/18/12 >> Test time 7 hrs 44 min.  Basal SpO2 96%, SpO2 low 81%.  Spent 0.7% of time with SpO2 < 88%.  Will have my nurse inform pt's mother that ONO report looks much better with combination of CPAP and 1 liter oxygen.  No change to current set up.

## 2012-07-29 ENCOUNTER — Other Ambulatory Visit (INDEPENDENT_AMBULATORY_CARE_PROVIDER_SITE_OTHER): Payer: Medicare (Managed Care)

## 2012-07-29 ENCOUNTER — Ambulatory Visit: Payer: Medicare (Managed Care) | Admitting: Internal Medicine

## 2012-07-29 DIAGNOSIS — L680 Hirsutism: Secondary | ICD-10-CM

## 2012-07-29 DIAGNOSIS — E288 Other ovarian dysfunction: Secondary | ICD-10-CM

## 2012-07-30 ENCOUNTER — Ambulatory Visit: Payer: Medicare (Managed Care) | Admitting: Family Medicine

## 2012-07-30 ENCOUNTER — Other Ambulatory Visit: Payer: Medicare (Managed Care)

## 2012-07-30 LAB — CORTISOL-AM, BLOOD: Cortisol - AM: 15.3 ug/dL (ref 4.3–22.4)

## 2012-07-30 NOTE — Telephone Encounter (Signed)
lmomtcb x1 for pt 

## 2012-08-01 NOTE — Telephone Encounter (Signed)
LMTCB

## 2012-08-02 LAB — 17-HYDROXYPROGESTERONE: 17-OH-Progesterone, LC/MS/MS: 36 ng/dL

## 2012-08-02 NOTE — Telephone Encounter (Signed)
Spoke w patients mother Informed her results/recs as listed below per Dr. Craige Cotta Pts mother verbalized understanding and nothing further needed at this time

## 2012-08-14 ENCOUNTER — Encounter: Payer: Self-pay | Admitting: Pulmonary Disease

## 2012-08-27 ENCOUNTER — Ambulatory Visit: Payer: Medicare (Managed Care) | Admitting: Family Medicine

## 2012-08-27 ENCOUNTER — Ambulatory Visit: Payer: Medicare (Managed Care) | Admitting: Pulmonary Disease

## 2012-08-28 ENCOUNTER — Encounter: Payer: Self-pay | Admitting: Pulmonary Disease

## 2012-09-09 ENCOUNTER — Ambulatory Visit: Payer: Self-pay | Admitting: Nurse Practitioner

## 2012-09-23 ENCOUNTER — Encounter: Payer: Self-pay | Admitting: Pulmonary Disease

## 2012-09-23 ENCOUNTER — Ambulatory Visit (INDEPENDENT_AMBULATORY_CARE_PROVIDER_SITE_OTHER): Payer: Medicare Other | Admitting: Pulmonary Disease

## 2012-09-23 VITALS — BP 112/72 | HR 70 | Temp 98.5°F | Ht <= 58 in | Wt 240.0 lb

## 2012-09-23 DIAGNOSIS — G4733 Obstructive sleep apnea (adult) (pediatric): Secondary | ICD-10-CM

## 2012-09-23 DIAGNOSIS — G4739 Other sleep apnea: Secondary | ICD-10-CM

## 2012-09-23 DIAGNOSIS — G4736 Sleep related hypoventilation in conditions classified elsewhere: Secondary | ICD-10-CM

## 2012-09-23 NOTE — Assessment & Plan Note (Signed)
She is doing very well with CPAP.  She is compliant and reports benefit.

## 2012-09-23 NOTE — Assessment & Plan Note (Signed)
She is to continue 1 liter oxygen at night with CPAP.

## 2012-09-23 NOTE — Progress Notes (Signed)
Chief Complaint  Patient presents with  . Sleep Apnea    Currently using CPAP machine every night for 10 hours. Denies problems with the machine, mask or pressure.    History of Present Illness: [name] is a 42 y.o. female with severe OSA.  She has prior hx of pneumonia, empyema, and respiratory failure requiring tracheostomy.  She is accompanied by her mother.  She is sleeping much better.  She no longer falls asleep during the day.  She uses her CPAP with oxygen every night.  She has nasal mask.  There is no trouble with mouth dryness or sinus congestion.  She gets chapped lips, but has always had trouble with this.  She does not need to take naps during the day anymore.  Her mother is very pleased with her improvement.  Tests: Echo 10/09/11 >> EF 60 to 65% ONO with RA 01/31/12>>Test time 6 hrs 1 min.  Mean SpO2 82%, low SpO2 59%.  Spent 4 hrs 16 min with SpO2 < 88%. ONO with 2L 04/02/12 >> Test time 7 hrs 53 min.  Mean SpO2 92.4%, low SpO2 71%.  Spent 1 hr 36 min with SpO2 < 88%. ONO with 3 liters 05/03/12 >> Test time 7 hrs 57 min.  Mean SpO2 93.2%, low SpO2 65%.  Spent 1 hr 18 min with SpO2 < 88%. PSG 06/02/12 >> AHI 118.3, SpO2 low 67%.  CPAP 11 cm H2O >> better, but difficulty using mask.  Did not need supplemental oxygen with CPAP. ONO with CPAP and RA 06/21/12 >> Test time 9 hrs 30 min.  Basal SpO2 92.9%, low SpO2 66%.  Spent 17.7 min with SpO2 < 88%. Auto-CPAP 06/10/12 to 07/09/12 >> Used on 28 of 30 nights with average 7 hrs 56 min.  Average AHI 3.6 with median CPAP 10 cm H2O and 95 th percentile CPAP 13 cm H2O. ONO with CPAP and 1 liter 07/18/12 >> Test time 7 hrs 44 min.  Basal SpO2 96%, SpO2 low 81%.  Spent 0.7% of time with SpO2 < 88%.  She  has a past medical history of Down syndrome; Pneumonia; Empyema of left pleural space (09/24/2011); DJD (degenerative joint disease) of knee; Depression; History of diabetes mellitus; Hypothyroid; Seizure; Dementia due to another medical  condition; and OSA on CPAP (05/2012).  She  has past surgical history that includes Back surgery; Anterior cruciate ligament repair; Flexible bronchoscopy (09/24/2011); Tracheostomy tube placement (10/11/2011); and left VATS.   Current Outpatient Prescriptions on File Prior to Visit  Medication Sig Dispense Refill  . FLUoxetine (PROZAC) 20 MG capsule Take 20 mg by mouth 2 (two) times daily.      Marland Kitchen levETIRAcetam (KEPPRA) 500 MG tablet Take 1 tablet (500 mg total) by mouth every 12 (twelve) hours.  60 tablet  11  . levothyroxine (SYNTHROID, LEVOTHROID) 125 MCG tablet Take 1 tablet (125 mcg total) by mouth daily before breakfast.  30 tablet  11  . memantine (NAMENDA) 10 MG tablet Take 1 tablet (10 mg total) by mouth 2 (two) times daily.  60 tablet  11  . Multiple Vitamins-Minerals (WOMENS MULTI PO) Take by mouth daily.      . pantoprazole (PROTONIX) 40 MG tablet Take 1 tablet (40 mg total) by mouth daily.  30 tablet  1   No current facility-administered medications on file prior to visit.    No Known Allergies   Physical Exam:  General - No distress, Down's body habitus ENT - No sinus tenderness, no oral  exudate, no LAN, enlarged tongue, scar over anterior neck, wears glasses Cardiac - s1s2 regular, no murmur Chest - No wheeze/rales/dullness, good air entry, normal respiratory excursion Back - No focal tenderness Abd - Soft, non-tender Ext - No edema Neuro - Normal strength Skin - No rashes Psych - Normal mood, and behavior   Assessment/Plan:  Christine Helling, MD  Pulmonary/Critical Care/Sleep Pager:  [phone] 09/23/2012, 9:52 AM

## 2012-09-23 NOTE — Patient Instructions (Signed)
Follow up in 6 months 

## 2012-09-24 ENCOUNTER — Telehealth: Payer: Self-pay | Admitting: Family Medicine

## 2012-09-24 DIAGNOSIS — E039 Hypothyroidism, unspecified: Secondary | ICD-10-CM

## 2012-09-24 MED ORDER — SERTRALINE HCL 50 MG PO TABS: 50.0000 mg | ORAL_TABLET | Freq: Two times a day (BID) | ORAL | Status: DC

## 2012-09-24 NOTE — Telephone Encounter (Signed)
Parents seen today - endorse worsening OCD and hoarding tendencies over last several months, increasing irritability and crying. Will change from prozac 20mg  bid to sertraline 50mg  bid. To update me with response. Mom not interested in meds that may cause weight gain Wt Readings from Last 3 Encounters:  09/23/12 240 lb (108.863 kg)  07/16/12 237 lb 12 oz (107.843 kg)  06/06/12 239 lb (108.41 kg)

## 2012-09-26 MED ORDER — LORAZEPAM 0.5 MG PO TABS: 0.5000 mg | ORAL_TABLET | Freq: Every day | ORAL | Status: DC | PRN

## 2012-09-26 NOTE — Addendum Note (Signed)
Addended by: Eustaquio Boyden on: 09/26/2012 04:25 PM   Modules accepted: Orders

## 2012-09-26 NOTE — Telephone Encounter (Signed)
Rx called in as directed. Message left advising patient's mother and advised to call and schedule lab appt for this month.

## 2012-09-26 NOTE — Telephone Encounter (Signed)
pts mother, [name] picked up zoloft and asked the pharmacist since pt had taken a prozac this morning could pt take zoloft this evening. [name] was advised to start zoloft on 09/27/12 and that zoloft was not as long lasting.rebecca is nervous because she did not think prozac was long lasting and pt is coming home on daily basis upset; [name] wants to know if another med can be called in to [facility] [address] to calm pt's nerves while pt is getting used to taking Zoloft.Please advise. [name] request cb.

## 2012-09-26 NOTE — Telephone Encounter (Signed)
Ok to start sertraline tomorrow 09/27/2012. May do Temporary course of lorazepam 1 tablet daily as needed for nerves.  plz phone this in and notify mom She will also be due for f/u blood work to recheck thyroid levels with recent change in dose.  plz schedule this as well.

## 2012-10-09 ENCOUNTER — Telehealth: Payer: Self-pay

## 2012-10-09 MED ORDER — SERTRALINE HCL 50 MG PO TABS: ORAL_TABLET | ORAL | Status: DC

## 2012-10-09 NOTE — Telephone Encounter (Signed)
pts mother said she does not see improvement since pt taking Zoloft and pt sleeping a lot more; pt goes to bed 9pm and does not want to get up next day at 1 pm in afternoon; pt presently taking Zoloft 50 mg taking one tab bid. Pt is hiding food and collecting and hiding trash; when pts mother takes trash from her pt gets upset and cries. That is when pts mother gives pt lorazepam; usually at night time. Pts mother said the Zoloft needs to be increased (but since concerned about pt sleeping so much pts mother wonders if should stop Zoloft and go back on Prozac. If puts pt back on Prozac will need to increase to three times a day. [facility] [address]Please advise. [name] request cb.

## 2012-10-09 NOTE — Telephone Encounter (Signed)
Are we sure sleeping is due to zoloft? I'd start with only using 1/2 tablet of lorazepam as needed (which can be sedating) I'd like to try increasing zoloft - take 100mg  in am and 50mg  at night time.  New dose sent to pharmacy.  Please remind to come in for TSH check as overdue and if thyroid is not controlled, can have worsening mood issues.

## 2012-10-10 ENCOUNTER — Encounter: Payer: Self-pay | Admitting: Radiology

## 2012-10-10 NOTE — Telephone Encounter (Signed)
Spoke with patient's mother and she said that she has only been using the lorazepam PRN (when patient is extremely upset) and the sleepiness has been ongoing even without it. She got the new dosage of the zoloft and will try that to see if that helps. Lab appt scheduled.

## 2012-10-11 ENCOUNTER — Other Ambulatory Visit: Payer: Medicare (Managed Care)

## 2012-10-11 ENCOUNTER — Other Ambulatory Visit (INDEPENDENT_AMBULATORY_CARE_PROVIDER_SITE_OTHER): Payer: Medicare Other

## 2012-10-11 DIAGNOSIS — E039 Hypothyroidism, unspecified: Secondary | ICD-10-CM

## 2012-10-11 LAB — TSH: TSH: 1.2 u[IU]/mL (ref 0.35–5.50)

## 2012-10-14 ENCOUNTER — Encounter: Payer: Self-pay | Admitting: *Deleted

## 2012-10-14 ENCOUNTER — Other Ambulatory Visit: Payer: Self-pay | Admitting: Family Medicine

## 2012-10-24 ENCOUNTER — Encounter: Payer: Self-pay | Admitting: Family Medicine

## 2012-11-08 ENCOUNTER — Other Ambulatory Visit: Payer: Self-pay

## 2012-11-08 MED ORDER — MEMANTINE HCL 10 MG PO TABS: 10.0000 mg | ORAL_TABLET | Freq: Two times a day (BID) | ORAL | Status: DC

## 2012-11-29 ENCOUNTER — Ambulatory Visit (INDEPENDENT_AMBULATORY_CARE_PROVIDER_SITE_OTHER): Payer: Medicare Other | Admitting: Nurse Practitioner

## 2012-11-29 ENCOUNTER — Encounter: Payer: Self-pay | Admitting: Nurse Practitioner

## 2012-11-29 VITALS — BP 106/64 | HR 59 | Ht 59.0 in | Wt 234.0 lb

## 2012-11-29 DIAGNOSIS — G40209 Localization-related (focal) (partial) symptomatic epilepsy and epileptic syndromes with complex partial seizures, not intractable, without status epilepticus: Secondary | ICD-10-CM | POA: Insufficient documentation

## 2012-11-29 DIAGNOSIS — Q909 Down syndrome, unspecified: Secondary | ICD-10-CM

## 2012-11-29 DIAGNOSIS — F0390 Unspecified dementia without behavioral disturbance: Secondary | ICD-10-CM

## 2012-11-29 MED ORDER — MEMANTINE HCL ER 28 MG PO CP24: 28.0000 mg | ORAL_CAPSULE | Freq: Every day | ORAL | Status: DC

## 2012-11-29 NOTE — Progress Notes (Signed)
[facility]  PATIENT: [name] DOB: [birth year]   REASON FOR VISIT: Followup for epilepsy and memory loss    HISTORY OF PRESENT ILLNESS: [name],  42 year old female returns for followup. She has a history of Down syndrome and and cognitive decline over several years as well as seizure disorder. She is currently well controlled on Keppra and she is on Namenda without side effects to either drug. She has no new medical issues. She has no new complaints    HISTORY: [name] is a 61  year year old Caucasian lady with lifelong history of Down`s syndrome and cognitive decline in last several years from dementia who is  referred back to see need for a new problem of seizures. She had recent prolonged hospital stay at [facility] with empyema  and ventilator dependent respiratory failure. She underwent video assisted thoracotomy for empyma drainage. She required prolonged antibiotics and was eventually required tracheostomy. She had 2 brief episodes of what sound like complex partial seizures one week apart in September 2013. Her mother who is present today was eyewitness to both episodes. She was described as staring and unresponsive not following commands with a distant look on her face lasting a few minutes followed by a period of disorientation and tiredness. In the second episode she had some clonic activity in both her upper extremities. She was started on Keppra 500 twice a day falling and abnormal EEG on 11/04/2011 and with showed sharp waves in the left occipital parietal region. She was initially off  her Namenda and Prozac in the hospital stay and had some behavioral agitation but settled down after the medicines were restarted. She has done well since discharge and has not had any more seizures. She seems to be tolerating Keppra quite well without any side effects. She is finished a course of antibiotics and home   physical and occupation therapy. She is back living at home with mom and plans to start attending adult daycare soon. She apparently did not have any brain imaging study done.  03/11/12: [name] returns for followup. She was last seen by Dr. Pearlean Sosa 12/11/11: She had prolonged hospitilization in Sept 2013 and had seizure events. She was started  on Keppra at that time. No further seizure events. MRI of the brain 12/18/11 was normal. EEG was abnormal suggestive of mild focal irritability in right frontal and temporal regions but no definite epileptiform  activity is noted. ROS neg.    REVIEW OF SYSTEMS: Full 14 system review of systems performed and notable only for:  Constitutional: N/A  Cardiovascular: N/A  Ear/Nose/Throat: N/A  Skin: N/A  Eyes: N/A  Respiratory: N/A  Gastroitestinal: N/A  Hematology/Lymphatic: N/A  Endocrine: N/A Musculoskeletal:N/A  Allergy/Immunology: N/A  Neurological: N/A Psychiatric: N/A   ALLERGIES: No Known Allergies  HOME MEDICATIONS: Outpatient Prescriptions Prior to Visit  Medication Sig Dispense Refill  . levETIRAcetam (KEPPRA) 500 MG tablet Take 1 tablet (500 mg total) by mouth every 12 (twelve) hours.  60 tablet  11  . levothyroxine (SYNTHROID, LEVOTHROID) 125 MCG tablet Take 1 tablet (125 mcg total) by mouth Sosa before breakfast.  30 tablet  11  . LORazepam (ATIVAN) 0.5 MG tablet Take 1 tablet (0.5 mg total) by mouth Sosa as needed for anxiety.  20 tablet  0  . memantine (NAMENDA) 10 MG tablet Take 1 tablet (10 mg total) by mouth 2 (two) times Sosa.  60 tablet  0  .  Multiple Vitamins-Minerals (WOMENS MULTI PO) Take by mouth Sosa.      . pantoprazole (PROTONIX) 40 MG tablet TAKE 1 TABLET (40 MG TOTAL) BY MOUTH Sosa.  30 tablet  6  . sertraline (ZOLOFT) 50 MG tablet Take 2 pills in am (100mg ) and 1 pill in pm (50mg )  90 tablet  3   No facility-administered medications prior to visit.    PAST MEDICAL HISTORY: Past Medical History  Diagnosis  Date  . Down syndrome   . Pneumonia     multiple pneumonias  . Empyema of left pleural space 09/24/2011  . DJD (degenerative joint disease) of knee     bilat knee arthritis, had 3/3 Supartz injections [name])  . Depression     with OCD  . History of diabetes mellitus     during TPN  . Hypothyroid   . Seizure     x 2 during hospitalization  . Dementia due to another medical condition     Trisomy 21-related, sees [name] yearly, nl CT head 2010, did not tolerate aricept  . OSA on CPAP 05/2012    severe OSA with AHI 118, desat nadir to 67%  . Sleep-related hypoventilation 09/23/2012    PAST SURGICAL HISTORY: Past Surgical History  Procedure Laterality Date  . Back surgery      ruptured disc  . Anterior cruciate ligament repair      left  . Flexible bronchoscopy  09/24/2011    Procedure: FLEXIBLE BRONCHOSCOPY;  Surgeon: Purcell Nails, MD;  [facility] OR;  Service: Thoracic;  Laterality: N/A;  . Tracheostomy tube placement  10/11/2011    Procedure: TRACHEOSTOMY;  Surgeon: Serena Colonel, MD;  Location: [facility] OR;  Service: ENT;  Laterality: N/A;  . Left vats      (Dr. Barry Dienes)    FAMILY HISTORY: Family History  Problem Relation Age of Onset  . Arthritis Mother   . Hypertension Father   . Diabetes Father   . Cancer Neg Hx   . Coronary artery disease Neg Hx   . Stroke Neg Hx     SOCIAL HISTORY: History   Social History  . Marital Status: Single    Spouse Name: N/A    Number of Children: N/A  . Years of Education: N/A   Occupational History  . Not on file.   Social History Main Topics  . Smoking status: Never Smoker   . Smokeless tobacco: Never Used  . Alcohol Use: No  . Drug Use: No  . Sexual Activity: Not on file   Other Topics Concern  . Not on file   Social History Narrative   Lives with mom and dad, dogs   Activity:    Diet: fruits/vegetables Sosa     PHYSICAL EXAM  Filed Vitals:   11/29/12 1516  BP: 106/64  Pulse: 59  Height: 4\' 11"  (1.499 m)    Weight: 234 lb (106.142 kg)   Body mass index is 47.24 kg/(m^2).  Generalized: Well developed, in no acute distress with downs features Head: normocephalic and atraumatic,. Oropharynx benign  Neck: Supple, no carotid bruits  Cardiac: Regular rate rhythm, no murmur    Neurological examination   Mentation: Alert . Follows all commands speech and language fluent  Cranial nerve II-XII: Pupils were equal round reactive to light extraocular movements with end gaze horizontal nystagmus , visual field were full on confrontational test. Facial sensation and strength were normal. hearing was intact to finger rubbing bilaterally. Uvula tongue midline. head turning and shoulder  shrug and were normal and symmetric.Tongue protrusion into cheek strength was normal. Motor: normal bulk and tone, full strength in the BUE, BLE, fine finger movements normal, no pronator drift. No focal weakness Sensory: normal and symmetric to light touch, pinprick, and  vibration  Coordination: finger-nose-finger, heel-to-shin bilaterally, no dysmetria Reflexes: Brachioradialis 2/2, biceps 2/2, triceps 2/2, patellar 2/2, Achilles 2/2, plantar responses were flexor bilaterally. Gait and Station: Rising up from seated position without assistance, wide base stance,  moderate stride, good arm swing, smooth turning, able to perform tiptoe, and heel walking without difficulty. Unsteady with tandem  DIAGNOSTIC DATA (LABS, IMAGING, TESTING) - I reviewed patient records, labs, notes, testing and imaging myself where available.      Component Value Date/Time   NA 138 07/16/2012 1711   K 4.8 07/16/2012 1711   CL 103 07/16/2012 1711   CO2 29 07/16/2012 1711   GLUCOSE 85 07/16/2012 1711   BUN 13 07/16/2012 1711   CREATININE 1.2 07/16/2012 1711   CALCIUM 9.0 07/16/2012 1711   PROT 7.2 10/21/2011 0420   ALBUMIN 2.1* 10/21/2011 0420   AST 32 10/21/2011 0420   ALT 41* 10/21/2011 0420   ALKPHOS 51 10/21/2011 0420   BILITOT 0.3 10/21/2011  0420   GFRNONAA 64* 11/02/2011 0440   GFRAA 75* 11/02/2011 0440    Lab Results  Component Value Date   HGBA1C 5.6 07/16/2012    Lab Results  Component Value Date   TSH 1.20 10/11/2012      ASSESSMENT AND PLAN  42 y.o. year old female  has a past medical history of Down syndrome; Pneumonia; Empyema of left pleural space (09/24/2011); DJD (degenerative joint disease) of knee; Depression; History of diabetes mellitus; Hypothyroid; Seizure; Dementia due to another medical condition; OSA on CPAP (05/2012); and Sleep-related hypoventilation (09/23/2012). here for followup. No further seizure activity  Continue Keppra at current dose does not need refills Given Namenda starter pack as well as Namenda XR  Prescription for a free month F/U in 6 months  Nilda Riggs, [facility], [facility], APRN  [facility] [address], [address] [place], [place] [address] [phone]

## 2012-11-29 NOTE — Patient Instructions (Addendum)
Continue Keppra at current dose Given Namenda starter pack as well as Namenda XR  Prescription for a free month F/U in 6 months

## 2012-12-04 ENCOUNTER — Encounter: Payer: Self-pay | Admitting: Family Medicine

## 2012-12-16 ENCOUNTER — Telehealth: Payer: Self-pay | Admitting: Family Medicine

## 2012-12-16 NOTE — Telephone Encounter (Signed)
CAP worker, Domingo Sep called because she needs an updated med list and needs her bladder control pads (180 units per month for 12 months) and gloves(1 box of gloves per month for 12 months).  States she needs this faxed [phone] (needs to be attn Domingo Sep)  Rena left a voicemail for [employer].

## 2012-12-17 ENCOUNTER — Other Ambulatory Visit: Payer: Self-pay | Admitting: Neurology

## 2012-12-17 NOTE — Telephone Encounter (Signed)
Med list and order faxed to [facility] as directed.

## 2012-12-17 NOTE — Telephone Encounter (Signed)
plz provide updated med list. Ok to do below order for bladder control pads and gloves.

## 2012-12-18 ENCOUNTER — Other Ambulatory Visit: Payer: Self-pay | Admitting: Neurology

## 2012-12-29 ENCOUNTER — Other Ambulatory Visit: Payer: Self-pay | Admitting: Family Medicine

## 2013-01-01 ENCOUNTER — Telehealth: Payer: Self-pay

## 2013-01-01 DIAGNOSIS — F329 Major depressive disorder, single episode, unspecified: Secondary | ICD-10-CM

## 2013-01-01 DIAGNOSIS — F32A Depression, unspecified: Secondary | ICD-10-CM

## 2013-01-01 DIAGNOSIS — Q909 Down syndrome, unspecified: Secondary | ICD-10-CM

## 2013-01-01 DIAGNOSIS — F0390 Unspecified dementia without behavioral disturbance: Secondary | ICD-10-CM

## 2013-01-01 NOTE — Telephone Encounter (Signed)
Referral placed.  plz notify mom.

## 2013-01-01 NOTE — Telephone Encounter (Signed)
[name] left v/m that pt has CPX scheduled on 01/10/13 but in meantime pts mother wants referral to psychologist or mental health professional; pt OCD has worsened and is terrible now; pt having mood swings and med is not helping.Please advise.

## 2013-01-01 NOTE — Telephone Encounter (Signed)
Dr Sharen Hones has left office today and will send to Dr Para March also.

## 2013-01-02 NOTE — Telephone Encounter (Signed)
Patient's mother notified.

## 2013-01-05 ENCOUNTER — Other Ambulatory Visit: Payer: Self-pay | Admitting: Family Medicine

## 2013-01-05 DIAGNOSIS — R7303 Prediabetes: Secondary | ICD-10-CM

## 2013-01-05 DIAGNOSIS — E039 Hypothyroidism, unspecified: Secondary | ICD-10-CM

## 2013-01-05 DIAGNOSIS — D649 Anemia, unspecified: Secondary | ICD-10-CM

## 2013-01-05 DIAGNOSIS — E669 Obesity, unspecified: Secondary | ICD-10-CM

## 2013-01-05 DIAGNOSIS — Q909 Down syndrome, unspecified: Secondary | ICD-10-CM

## 2013-01-06 ENCOUNTER — Other Ambulatory Visit: Payer: Medicare Other

## 2013-01-10 ENCOUNTER — Encounter: Payer: Medicare Other | Admitting: Family Medicine

## 2013-02-06 ENCOUNTER — Other Ambulatory Visit (INDEPENDENT_AMBULATORY_CARE_PROVIDER_SITE_OTHER): Payer: Medicare Other

## 2013-02-06 DIAGNOSIS — R7309 Other abnormal glucose: Secondary | ICD-10-CM

## 2013-02-06 DIAGNOSIS — D649 Anemia, unspecified: Secondary | ICD-10-CM

## 2013-02-06 DIAGNOSIS — E039 Hypothyroidism, unspecified: Secondary | ICD-10-CM

## 2013-02-06 DIAGNOSIS — R7303 Prediabetes: Secondary | ICD-10-CM

## 2013-02-06 DIAGNOSIS — E669 Obesity, unspecified: Secondary | ICD-10-CM

## 2013-02-06 DIAGNOSIS — R7401 Elevation of levels of liver transaminase levels: Secondary | ICD-10-CM

## 2013-02-06 LAB — COMPREHENSIVE METABOLIC PANEL
ALT: 21 U/L (ref 0–35)
AST: 17 U/L (ref 0–37)
Albumin: 3.5 g/dL (ref 3.5–5.2)
Alkaline Phosphatase: 45 U/L (ref 39–117)
Chloride: 103 mEq/L (ref 96–112)
Creatinine, Ser: 1.2 mg/dL (ref 0.4–1.2)
GFR: 51.76 mL/min — ABNORMAL LOW (ref >60.00)
Glucose, Bld: 103 mg/dL — ABNORMAL HIGH (ref 70–99)
Total Bilirubin: 0.4 mg/dL (ref 0.3–1.2)

## 2013-02-06 LAB — FERRITIN: Ferritin: 57.9 ng/mL (ref 10.0–291.0)

## 2013-02-06 LAB — CBC WITH DIFFERENTIAL/PLATELET
Basophils Absolute: 0 10*3/uL (ref 0.0–0.1)
Eosinophils Absolute: 0.1 10*3/uL (ref 0.0–0.7)
Eosinophils Relative: 1.8 % (ref 0.0–5.0)
Hemoglobin: 13.1 g/dL (ref 12.0–15.0)
Lymphocytes Relative: 23.6 % (ref 12.0–46.0)
Lymphs Abs: 1.4 10*3/uL (ref 0.7–4.0)
MCHC: 33.3 g/dL (ref 30.0–36.0)
Neutro Abs: 3.8 10*3/uL (ref 1.4–7.7)
RDW: 15.7 % — ABNORMAL HIGH (ref 11.5–14.6)
WBC: 5.8 10*3/uL (ref 4.5–10.5)

## 2013-02-06 LAB — LIPID PANEL
HDL: 53.9 mg/dL (ref >39.00)
LDL Cholesterol: 106 mg/dL — ABNORMAL HIGH (ref 0–99)
Total CHOL/HDL Ratio: 3
VLDL: 19.4 mg/dL (ref 0.0–40.0)

## 2013-02-06 LAB — HEMOGLOBIN A1C: Hgb A1c MFr Bld: 5.6 % (ref 4.6–6.5)

## 2013-02-06 LAB — IBC PANEL
Iron: 72 ug/dL (ref 42–145)
Transferrin: 232.7 mg/dL (ref 212.0–360.0)

## 2013-02-11 ENCOUNTER — Ambulatory Visit (INDEPENDENT_AMBULATORY_CARE_PROVIDER_SITE_OTHER): Payer: Medicare Other | Admitting: Pulmonary Disease

## 2013-02-11 ENCOUNTER — Encounter: Payer: Self-pay | Admitting: Pulmonary Disease

## 2013-02-11 VITALS — BP 114/68 | HR 76 | Ht <= 58 in | Wt 238.0 lb

## 2013-02-11 DIAGNOSIS — G4739 Other sleep apnea: Secondary | ICD-10-CM

## 2013-02-11 DIAGNOSIS — G4733 Obstructive sleep apnea (adult) (pediatric): Secondary | ICD-10-CM

## 2013-02-11 DIAGNOSIS — G4736 Sleep related hypoventilation in conditions classified elsewhere: Secondary | ICD-10-CM

## 2013-02-11 NOTE — Assessment & Plan Note (Signed)
Continue oxygen with CPAP at night.   

## 2013-02-11 NOTE — Assessment & Plan Note (Signed)
She is compliant with CPAP and reports benefit. 

## 2013-02-11 NOTE — Patient Instructions (Signed)
Follow up in 1 year.

## 2013-02-11 NOTE — Progress Notes (Signed)
Chief Complaint  Patient presents with  . Sleep Apnea    Currently using CPAP machine every night. Feels well rested the day after use. Denies problems with machine, mask or pressure.    History of Present Illness: [name] is a 42 y.o. female with severe OSA.  She has prior hx of pneumonia, empyema, and respiratory failure requiring tracheostomy.  She is accompanied by her mother.  She has been doing well with CPAP and oxygen.   She has nasal mask.  She gets about 8 hours sleep on weekdays, but sleeps an extra 3 to 4 hours on weekends.   Her mother has not noticed her feeling sleepy during the day anymore.  Tests: Echo 10/09/11 >> EF 60 to 65% ONO with RA 01/31/12>>Test time 6 hrs 1 min.  Mean SpO2 82%, low SpO2 59%.  Spent 4 hrs 16 min with SpO2 < 88%. ONO with 2L 04/02/12 >> Test time 7 hrs 53 min.  Mean SpO2 92.4%, low SpO2 71%.  Spent 1 hr 36 min with SpO2 < 88%. ONO with 3 liters 05/03/12 >> Test time 7 hrs 57 min.  Mean SpO2 93.2%, low SpO2 65%.  Spent 1 hr 18 min with SpO2 < 88%. PSG 06/02/12 >> AHI 118.3, SpO2 low 67%.  CPAP 11 cm H2O >> better, but difficulty using mask.  Did not need supplemental oxygen with CPAP. ONO with CPAP and RA 06/21/12 >> Test time 9 hrs 30 min.  Basal SpO2 92.9%, low SpO2 66%.  Spent 17.7 min with SpO2 < 88%. Auto-CPAP 06/10/12 to 07/09/12 >> Used on 28 of 30 nights with average 7 hrs 56 min.  Average AHI 3.6 with median CPAP 10 cm H2O and 95 th percentile CPAP 13 cm H2O. ONO with CPAP and 1 liter 07/18/12 >> Test time 7 hrs 44 min.  Basal SpO2 96%, SpO2 low 81%.  Spent 0.7% of time with SpO2 < 88%.  She  has a past medical history of Down syndrome; Pneumonia; Empyema of left pleural space (09/24/2011); DJD (degenerative joint disease) of knee; Depression; History of diabetes mellitus; Hypothyroid; Seizure; Dementia due to another medical condition; OSA on CPAP (05/2012); Sleep-related hypoventilation (09/23/2012); and Eczema.  She  has past surgical  history that includes Back surgery; Anterior cruciate ligament repair; Flexible bronchoscopy (09/24/2011); Tracheostomy tube placement (10/11/2011); and left VATS.   Current Outpatient Prescriptions on File Prior to Visit  Medication Sig Dispense Refill  . levETIRAcetam (KEPPRA) 500 MG tablet TAKE 1 TABLET (500 MG TOTAL) BY MOUTH EVERY 12 (TWELVE) HOURS.  60 tablet  4  . levothyroxine (SYNTHROID, LEVOTHROID) 125 MCG tablet Take 1 tablet (125 mcg total) by mouth daily before breakfast.  30 tablet  11  . LORazepam (ATIVAN) 0.5 MG tablet Take 1 tablet (0.5 mg total) by mouth daily as needed for anxiety.  20 tablet  0  . Memantine HCl ER (NAMENDA XR) 28 MG CP24 Take 28 mg by mouth daily.  30 capsule  0  . Multiple Vitamins-Minerals (WOMENS MULTI PO) Take by mouth daily.      Marland Kitchen NAMENDA 10 MG tablet TAKE 1 TABLET (10 MG TOTAL) BY MOUTH 2 (TWO) TIMES DAILY.  60 tablet  5  . NAMENDA 10 MG tablet TAKE 1 TABLET (10 MG TOTAL) BY MOUTH 2 (TWO) TIMES DAILY.  60 tablet  6  . pantoprazole (PROTONIX) 40 MG tablet TAKE 1 TABLET (40 MG TOTAL) BY MOUTH DAILY.  30 tablet  6  . sertraline (ZOLOFT) 50  MG tablet Take 2 pills in am (100mg ) and 1 pill in pm (50mg )  90 tablet  3   No current facility-administered medications on file prior to visit.    No Known Allergies   Physical Exam:  General - No distress, Down's body habitus ENT - No sinus tenderness, no oral exudate, no LAN, enlarged tongue, scar over anterior neck, wears glasses Cardiac - s1s2 regular, no murmur Chest - No wheeze/rales/dullness, good air entry, normal respiratory excursion Back - No focal tenderness Abd - Soft, non-tender Ext - No edema Neuro - Normal strength Skin - No rashes Psych - Normal mood, and behavior   Assessment/Plan:  Coralyn Helling, MD [facility] Pager:  [phone] 02/11/2013, 4:13 PM

## 2013-02-12 IMAGING — CR DG ABD PORTABLE 1V
1 series · 1 of 1 positions shown · non-contrast
Comparison: 10/12/2011.

CLINICAL DATA: 41-year-old female feeding tube placement.

PORTABLE ABDOMEN - 1 VIEW

[AP]
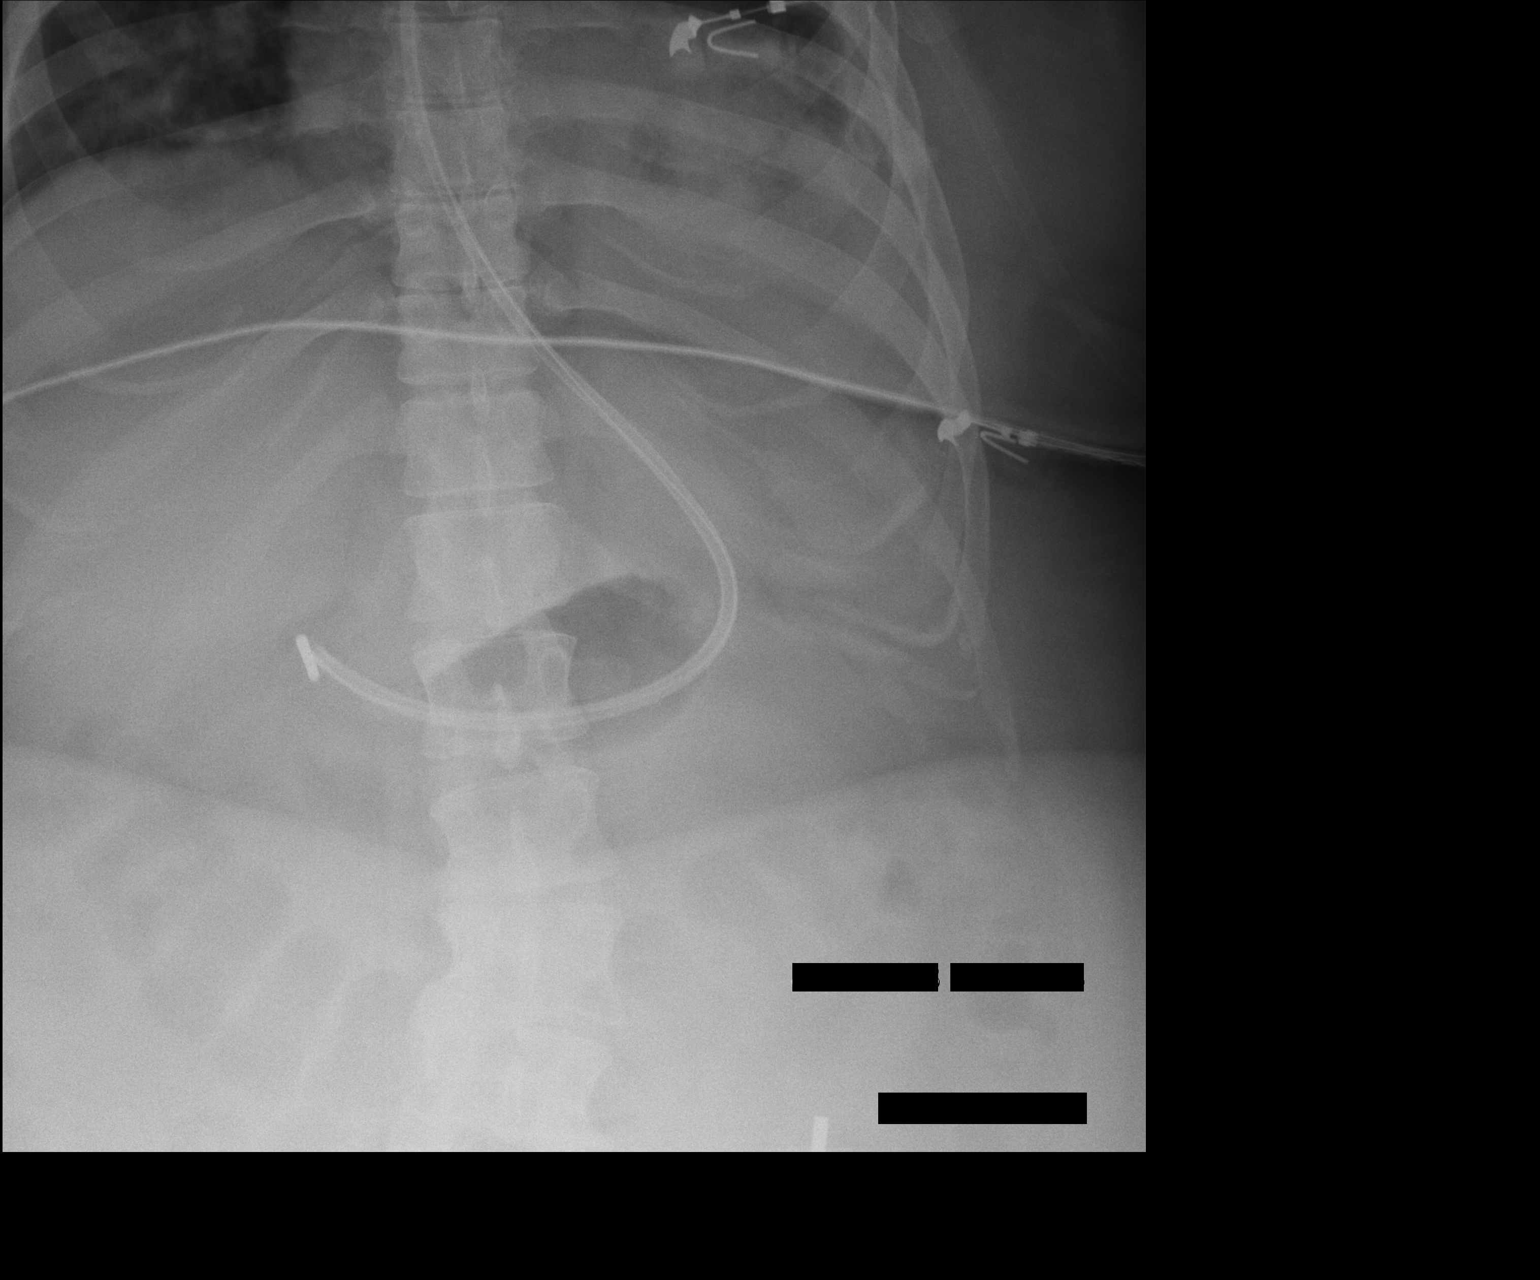

[1 of 1 positions shown; findings below may reference images not displayed]

FINDINGS: Semi upright AP portable view 0889 hours.  Enteric tube
courses to the abdomen and across midline to the right upper
quadrant where the tip is angled down.  Stable bowel gas pattern,
primarily with gas in nondilated colon.  Respiratory motion
artifact in increased opacity at both lung bases.
IMPRESSION: Feeding tube tip in the region of the duodenal bulb.

## 2013-02-12 IMAGING — CR DG CHEST 1V PORT
1 series · 1 of 1 positions shown · non-contrast
Comparison: 10/16/2011 at [DATE] a.m..

CLINICAL DATA: Restoration failure.

PORTABLE CHEST - 1 VIEW

[AP]
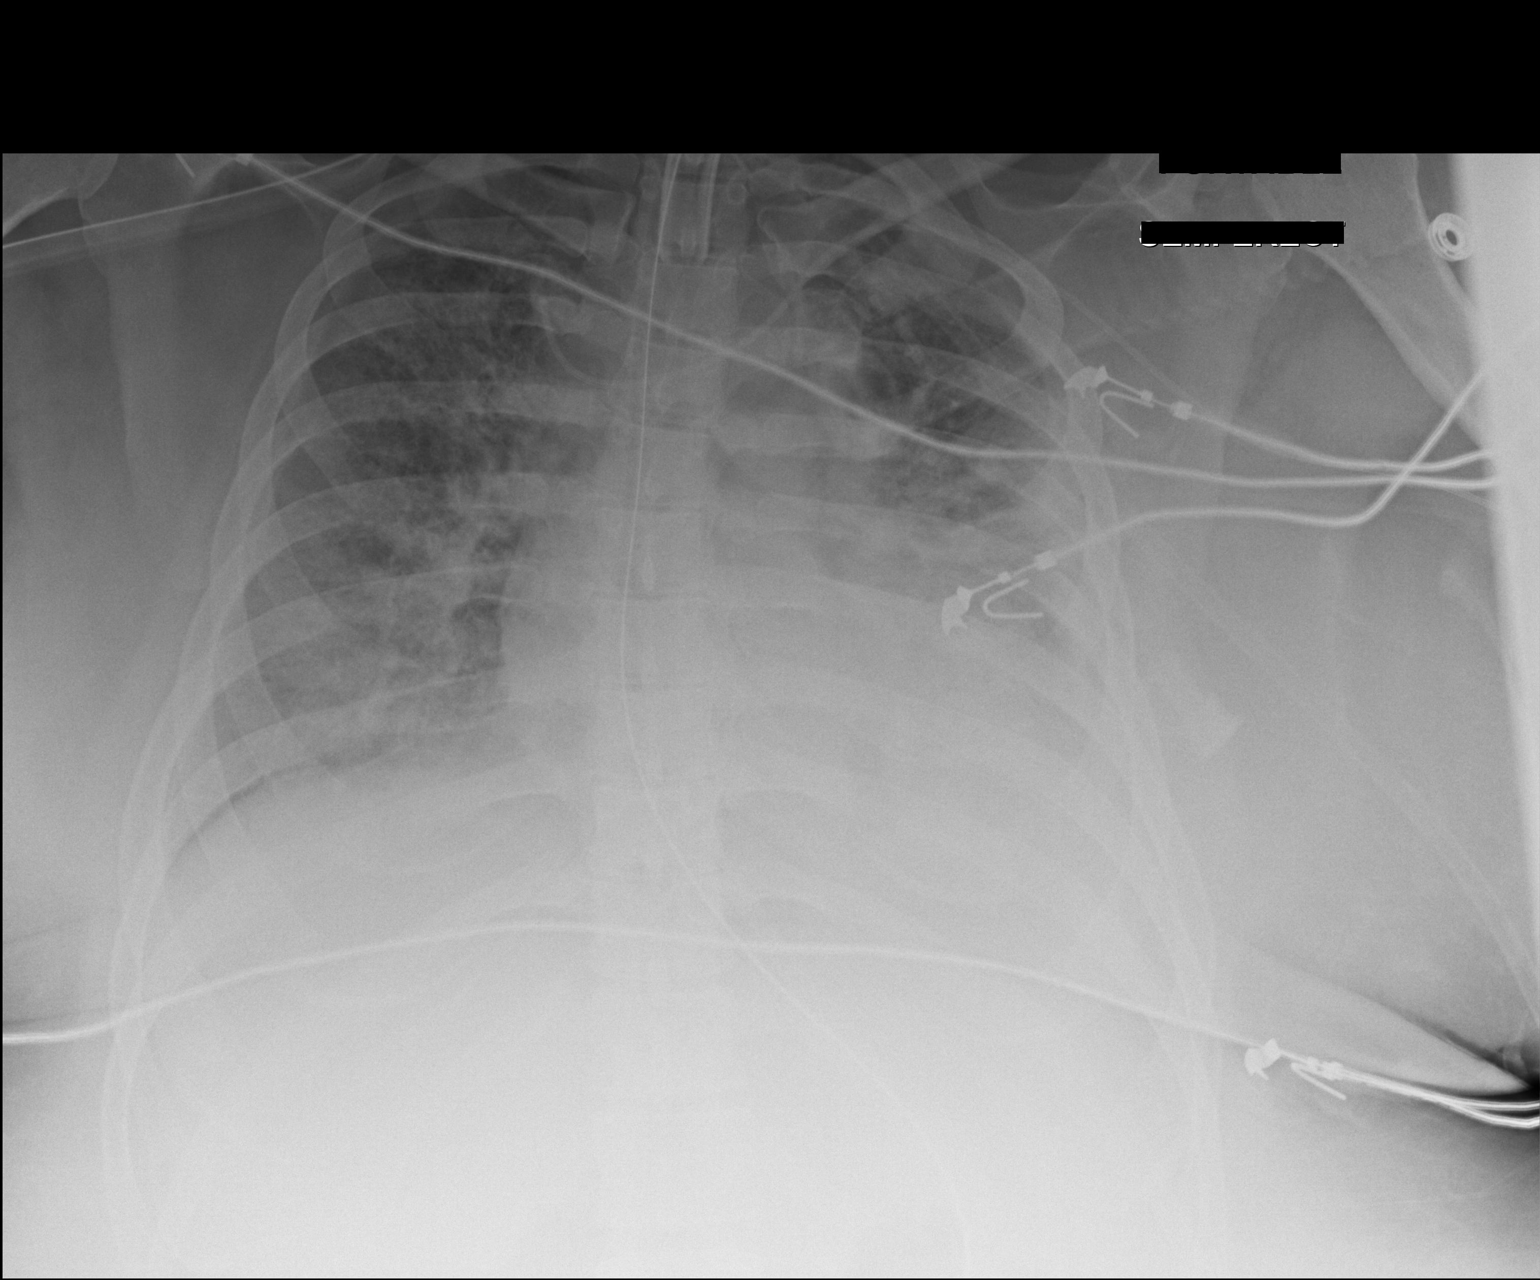

[1 of 1 positions shown; findings below may reference images not displayed]

FINDINGS: There is diffuse bilateral air space disease with slight
improvement in aeration of the right lung base.  The tracheostomy
tube and nasogastric tube remain in satisfactory position.  The
left subclavian central venous catheter tip is directed into the
region of the right innominate vein and is unchanged in position.
There is no pneumothorax.
IMPRESSION: Diffuse bilateral air space disease with slight improvement in
aeration of the right lung base.

## 2013-02-13 ENCOUNTER — Encounter: Payer: Self-pay | Admitting: Family Medicine

## 2013-02-13 ENCOUNTER — Encounter: Payer: Self-pay | Admitting: *Deleted

## 2013-02-13 ENCOUNTER — Ambulatory Visit (INDEPENDENT_AMBULATORY_CARE_PROVIDER_SITE_OTHER): Payer: Medicare Other | Admitting: Family Medicine

## 2013-02-13 VITALS — BP 118/64 | HR 68 | Temp 98.1°F | Ht <= 58 in | Wt 235.8 lb

## 2013-02-13 DIAGNOSIS — Z1231 Encounter for screening mammogram for malignant neoplasm of breast: Secondary | ICD-10-CM

## 2013-02-13 DIAGNOSIS — F32A Depression, unspecified: Secondary | ICD-10-CM

## 2013-02-13 DIAGNOSIS — H9190 Unspecified hearing loss, unspecified ear: Secondary | ICD-10-CM

## 2013-02-13 DIAGNOSIS — R7309 Other abnormal glucose: Secondary | ICD-10-CM

## 2013-02-13 DIAGNOSIS — R7303 Prediabetes: Secondary | ICD-10-CM

## 2013-02-13 DIAGNOSIS — R7401 Elevation of levels of liver transaminase levels: Secondary | ICD-10-CM

## 2013-02-13 DIAGNOSIS — Z23 Encounter for immunization: Secondary | ICD-10-CM

## 2013-02-13 DIAGNOSIS — Z0000 Encounter for general adult medical examination without abnormal findings: Secondary | ICD-10-CM

## 2013-02-13 DIAGNOSIS — H6120 Impacted cerumen, unspecified ear: Secondary | ICD-10-CM

## 2013-02-13 DIAGNOSIS — F329 Major depressive disorder, single episode, unspecified: Secondary | ICD-10-CM

## 2013-02-13 DIAGNOSIS — E039 Hypothyroidism, unspecified: Secondary | ICD-10-CM

## 2013-02-13 DIAGNOSIS — H6123 Impacted cerumen, bilateral: Secondary | ICD-10-CM

## 2013-02-13 DIAGNOSIS — H9193 Unspecified hearing loss, bilateral: Secondary | ICD-10-CM

## 2013-02-13 DIAGNOSIS — R109 Unspecified abdominal pain: Secondary | ICD-10-CM

## 2013-02-13 DIAGNOSIS — G4733 Obstructive sleep apnea (adult) (pediatric): Secondary | ICD-10-CM

## 2013-02-13 LAB — POCT URINALYSIS DIPSTICK
Bilirubin, UA: NEGATIVE
Ketones, UA: NEGATIVE
Leukocytes, UA: NEGATIVE
Nitrite, UA: POSITIVE
Protein, UA: NEGATIVE

## 2013-02-13 IMAGING — CR DG CHEST 1V PORT
1 series · 1 of 1 positions shown · non-contrast
Comparison: 10/17/2011

CLINICAL DATA: Follow up CHF

PORTABLE CHEST - 1 VIEW

[AP]
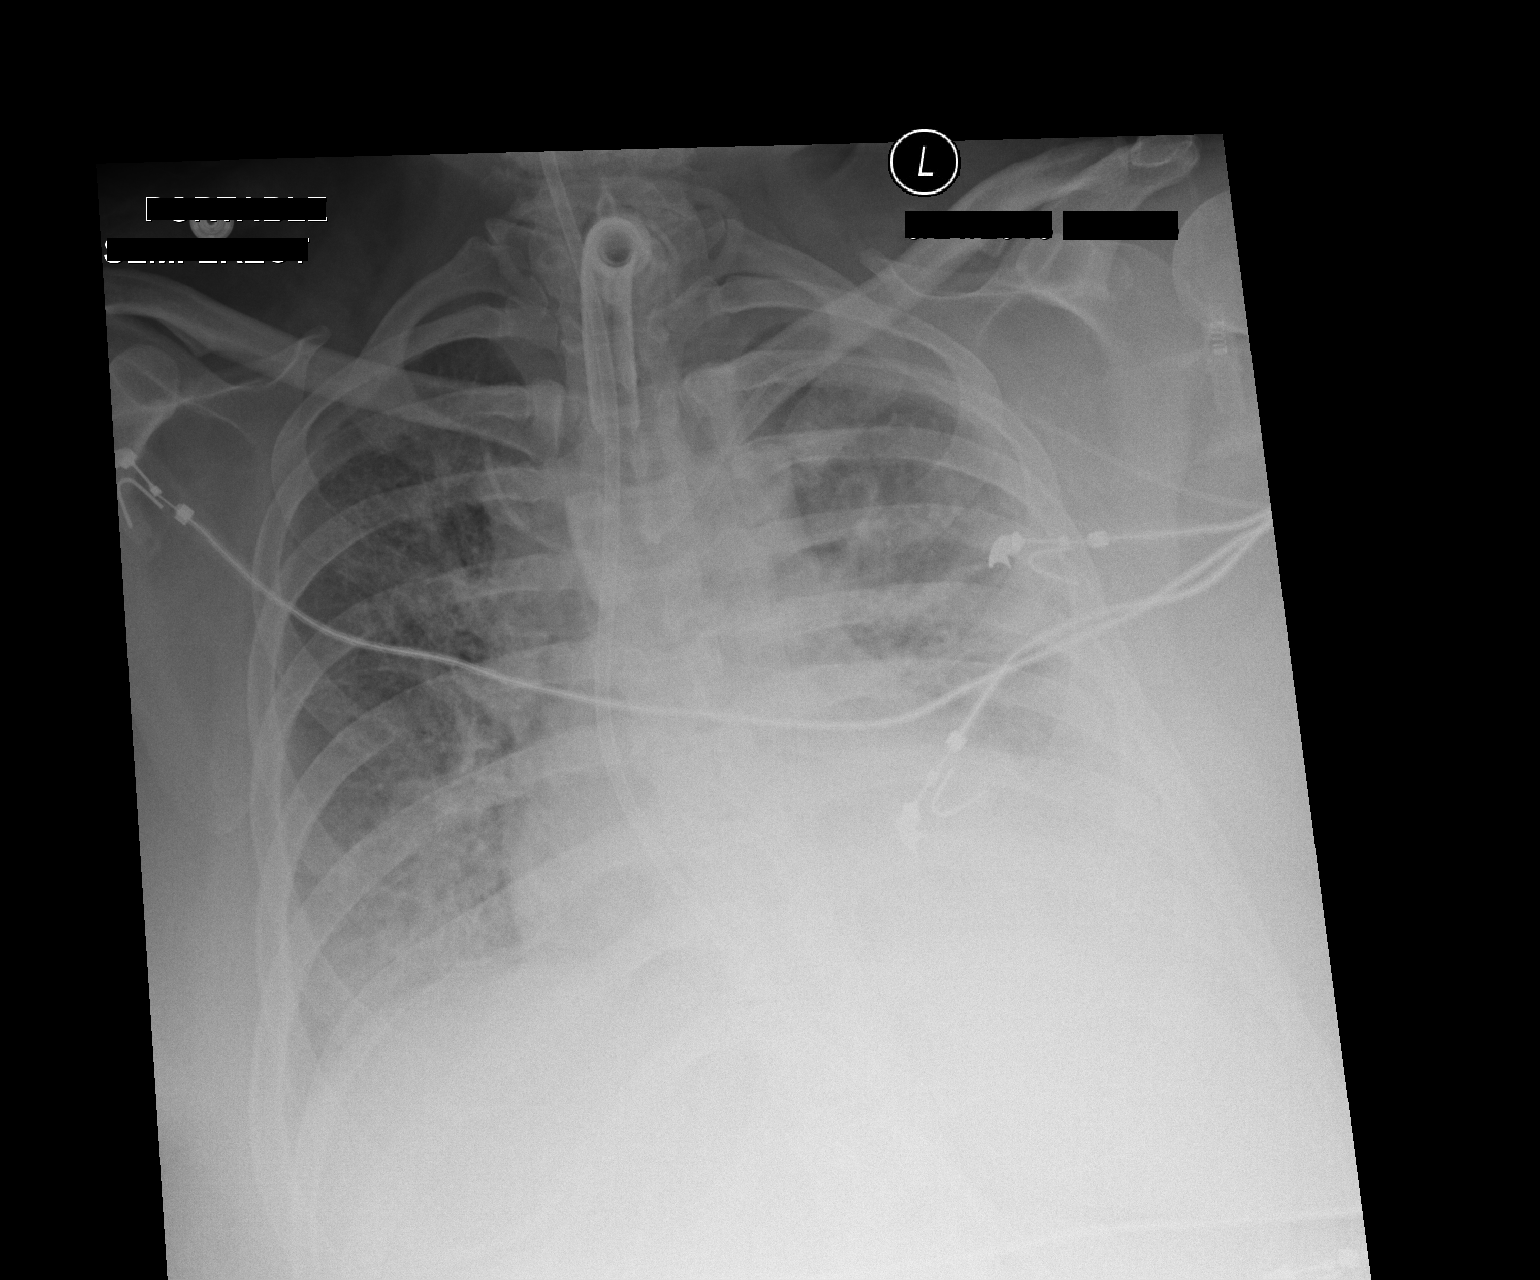

[1 of 1 positions shown; findings below may reference images not displayed]

FINDINGS: Tracheostomy tube tip is above the carina. Left arm PICC
line tip has in the projection of the SVC but is oriented
cranially.  There is a feeding tube with tip below the field of
view.  Heart size is stable.  No significant change in the left
pleural effusion and moderate interstitial edema.
IMPRESSION: 1.  No significant change in CHF pattern.
2.  PICC line tip appears oriented cranially in the projection of
the SVC.

## 2013-02-13 NOTE — Progress Notes (Signed)
Subjective:    Patient ID: [name], female    DOB: 18-Oct-1970, 42 y.o.   MRN: [id number]  HPI CC: medicare wellness visit  Abd pain - ongoing issue - difficult historian - unsure if dysuria or other urinary sxs (mom denies).  Pt has endorsed. Normal stooling, no blood in stool.  Appetite stable. Endorses occipital headaches intermittently.  Hearing - failed left ear.  Irrigation performed. Vision - trouble with both eyes.  Sees Dr. Ashley Royalty and [facility].  ?glaucoma.  Last seen them early 2014.   No falls in last year. Mood - easily irritated and worsening OCD/hoarding.  Pending appt with [facility] 03/17/2012. Mom doesn't see any concerns with dementia.  Preventative: Well woman - discussed with mom.  would like mammogram scheduled but doesn't want pap smear or pelvic exam or breast exam. Flu shot - Would like today. Pneumovax done 2013. Tetanus - around 5-10 years ago.  Would like Tdap today. Advanced directives: mom would be HCPOA but this has not been set up yet - mom will work on this.  Wt Readings from Last 3 Encounters:  02/13/13 221 lb 8 oz (100.472 kg)  02/11/13 238 lb (107.956 kg)  11/29/12 234 lb (106.142 kg)    Medications and allergies reviewed and updated in chart.  Past histories reviewed and updated if relevant as below. Patient Active Problem List   Diagnosis Date Noted  . Localization-related (focal) (partial) epilepsy and epileptic syndromes with complex partial seizures, without mention of intractable epilepsy 11/29/2012  . Presenile dementia, uncomplicated 11/29/2012  . Sleep-related hypoventilation 09/23/2012  . Cough 12/05/2011  . OSA on CPAP 11/15/2011  . Obesity 11/15/2011  . Cerumen impaction 11/14/2011  . Arthritis   . Hypothyroid   . Seizure   . Prediabetes   . Depression   . Down syndrome   . Transaminitis 09/24/2011   Past Medical History  Diagnosis Date  . Down syndrome   . Pneumonia     multiple pneumonias  . Empyema of left  pleural space 09/24/2011  . DJD (degenerative joint disease) of knee     bilat knee arthritis, had 3/3 Supartz injections [name])  . Depression     with OCD  . History of diabetes mellitus     during TPN  . Hypothyroid   . Seizure     x 2 during hospitalization  . Dementia due to another medical condition     Trisomy 21-related, sees [name] yearly, nl CT head 2010, did not tolerate aricept  . OSA on CPAP 05/2012    severe OSA with AHI 118, desat nadir to 67%  . Sleep-related hypoventilation 09/23/2012  . Eczema    Past Surgical History  Procedure Laterality Date  . Back surgery      ruptured disc  . Anterior cruciate ligament repair      left  . Flexible bronchoscopy  09/24/2011    Procedure: FLEXIBLE BRONCHOSCOPY;  Surgeon: Purcell Nails, MD;  Location: [facility] OR;  Service: Thoracic;  Laterality: N/A;  . Tracheostomy tube placement  10/11/2011    Procedure: TRACHEOSTOMY;  Surgeon: Serena Colonel, MD;  Location: [facility] OR;  Service: ENT;  Laterality: N/A;  . Left vats      (Dr. Barry Dienes)   History  Substance Use Topics  . Smoking status: Never Smoker   . Smokeless tobacco: Never Used  . Alcohol Use: No   Family History  Problem Relation Age of Onset  . Arthritis Mother   . Hypertension Father   .  Diabetes Father   . Cancer Neg Hx   . Coronary artery disease Neg Hx   . Stroke Neg Hx    No Known Allergies Current Outpatient Prescriptions on File Prior to Visit  Medication Sig Dispense Refill  . levETIRAcetam (KEPPRA) 500 MG tablet TAKE 1 TABLET (500 MG TOTAL) BY MOUTH EVERY 12 (TWELVE) HOURS.  60 tablet  4  . levothyroxine (SYNTHROID, LEVOTHROID) 125 MCG tablet Take 1 tablet (125 mcg total) by mouth daily before breakfast.  30 tablet  11  . LORazepam (ATIVAN) 0.5 MG tablet Take 1 tablet (0.5 mg total) by mouth daily as needed for anxiety.  20 tablet  0  . Memantine HCl ER (NAMENDA XR) 28 MG CP24 Take 28 mg by mouth daily.  30 capsule  0  . Multiple Vitamins-Minerals (WOMENS MULTI PO)  Take by mouth daily.      Marland Kitchen NAMENDA 10 MG tablet TAKE 1 TABLET (10 MG TOTAL) BY MOUTH 2 (TWO) TIMES DAILY.  60 tablet  6  . pantoprazole (PROTONIX) 40 MG tablet TAKE 1 TABLET (40 MG TOTAL) BY MOUTH DAILY.  30 tablet  6  . sertraline (ZOLOFT) 50 MG tablet Take 2 pills in am (100mg ) and 1 pill in pm (50mg )  90 tablet  3   No current facility-administered medications on file prior to visit.     Review of Systems  Constitutional: Negative for fever, chills, activity change, appetite change, fatigue and unexpected weight change.  HENT: Negative for hearing loss.   Eyes: Positive for visual disturbance.  Respiratory: Negative for cough, chest tightness, shortness of breath and wheezing.   Cardiovascular: Negative for chest pain, palpitations and leg swelling.  Gastrointestinal: Positive for abdominal pain. Negative for nausea, vomiting, diarrhea, constipation, blood in stool and abdominal distention.  Genitourinary: Negative for hematuria and difficulty urinating.  Musculoskeletal: Negative for arthralgias, myalgias and neck pain.  Skin: Negative for rash.  Neurological: Positive for headaches. Negative for dizziness, seizures and syncope.  Hematological: Negative for adenopathy. Does not bruise/bleed easily.  Psychiatric/Behavioral: Positive for behavioral problems, dysphoric mood and agitation. The patient is not nervous/anxious.        Objective:   Physical Exam  Nursing note and vitals reviewed. Constitutional: She is oriented to person, place, and time. She appears well-developed and well-nourished. No distress.  HENT:  Head: Normocephalic and atraumatic.  Right Ear: Hearing, external ear and ear canal normal.  Left Ear: External ear and ear canal normal. Decreased hearing is noted.  Nose: Nose normal.  Mouth/Throat: Oropharynx is clear and moist. No oropharyngeal exudate.  Cerumen impaction bilaterally  Eyes: Conjunctivae and EOM are normal. Pupils are equal, round, and reactive to  light. No scleral icterus.  Neck: Normal range of motion. Neck supple. Carotid bruit is not present. No thyromegaly present.  Cardiovascular: Normal rate, regular rhythm, normal heart sounds and intact distal pulses.   No murmur heard. Pulses:      Radial pulses are 2+ on the right side, and 2+ on the left side.  Pulmonary/Chest: Effort normal and breath sounds normal. No respiratory distress. She has no wheezes. She has no rales.  Abdominal: Soft. Bowel sounds are normal. She exhibits distension. She exhibits no mass. There is tenderness in the suprapubic area. There is no rigidity, no rebound, no guarding, no CVA tenderness and negative Murphy's sign.  Musculoskeletal: Normal range of motion. She exhibits no edema.  Lymphadenopathy:    She has no cervical adenopathy.  Neurological: She is alert and oriented  to person, place, and time.  CN grossly intact, station and gait intact  Skin: Skin is warm and dry. No rash noted.  Psychiatric: She has a normal mood and affect. Her behavior is normal. Judgment and thought content normal.       Assessment & Plan:

## 2013-02-13 NOTE — Progress Notes (Signed)
Pre-visit discussion using our clinic review tool. No additional management support is needed unless otherwise documented below in the visit note.  

## 2013-02-13 NOTE — Patient Instructions (Signed)
Flu and tetanus/pertussis shots today. Schedule eye appointment if you're due. We will schedule mammogram for you. Look into advanced directives. For stomach - urine checked today. For headaches, let's decrease zoloft to 50mg  in morning and 50mg  at night time. Return as needed or in 6 months for follow up. Good to see you today, call us with questions.

## 2013-02-14 ENCOUNTER — Encounter: Payer: Self-pay | Admitting: Family Medicine

## 2013-02-14 DIAGNOSIS — R1084 Generalized abdominal pain: Secondary | ICD-10-CM | POA: Insufficient documentation

## 2013-02-14 DIAGNOSIS — R109 Unspecified abdominal pain: Secondary | ICD-10-CM | POA: Insufficient documentation

## 2013-02-14 NOTE — Assessment & Plan Note (Signed)
Lab Results  Component Value Date   ALT 21 02/06/2013   AST 17 02/06/2013   ALKPHOS 45 02/06/2013   BILITOT 0.4 02/06/2013

## 2013-02-14 NOTE — Assessment & Plan Note (Signed)
Lab Results  Component Value Date   TSH 4.78 02/06/2013

## 2013-02-14 NOTE — Assessment & Plan Note (Signed)
Very stable on current CPAP setting.

## 2013-02-14 NOTE — Assessment & Plan Note (Signed)
Endorses suprapubic pain - UA with nitrites and micro with bacteria - will send culture. Recent LFTs and CBC stable  Lab Results  Component Value Date   WBC 5.8 02/06/2013   HGB 13.1 02/06/2013   HCT 39.4 02/06/2013   MCV 101.6* 02/06/2013   PLT 194.0 02/06/2013

## 2013-02-14 NOTE — Assessment & Plan Note (Signed)
Worsening hoarding with increased irritability. Prior on prozac 20mg  bid.  Then changed to sertraline and slowly increased to 100mg  in am and 50mg  at night.  ?contributing to occipital headaches over last few weeks, so will decrease to 50mg  bid and monitor. Pt has appt with [facility] next month.

## 2013-02-14 NOTE — Assessment & Plan Note (Signed)
Irrigation performed with success in office.

## 2013-02-14 NOTE — Assessment & Plan Note (Signed)
I have personally reviewed the Medicare Annual Wellness questionnaire and have noted 1. The patient's medical and social history 2. Their use of alcohol, tobacco or illicit drugs 3. Their current medications and supplements 4. The patient's functional ability including ADL's, fall risks, home safety risks and hearing or visual impairment. 5. Diet and physical activity 6. Evidence for depression or mood disorders The patients weight, height, BMI have been recorded in the chart.  Hearing and vision has been addressed. I have made referrals, counseling and provided education to the patient based review of the above and I have provided the pt with a written personalized care plan for preventive services. See scanned questionairre. Advanced directives discussed: mom will look into setting up advanced directive and HCPOA  Reviewed preventative protocols and updated unless pt declined. Will schedule mammogram and defer pelvic/breast exam per mother's preference Flu and Tdap today.

## 2013-02-14 NOTE — Assessment & Plan Note (Signed)
Lab Results  Component Value Date   HGBA1C 5.6 02/06/2013

## 2013-02-14 NOTE — Addendum Note (Signed)
Addended by: Annamarie Major on: 02/14/2013 09:01 AM   Modules accepted: Orders

## 2013-02-15 ENCOUNTER — Other Ambulatory Visit: Payer: Self-pay | Admitting: Family Medicine

## 2013-02-16 IMAGING — CR DG CHEST 1V PORT
1 series · 1 of 1 positions shown · non-contrast
Comparison: 10/20/2011

CLINICAL DATA: Follow up abscess

PORTABLE CHEST - 1 VIEW

[AP]
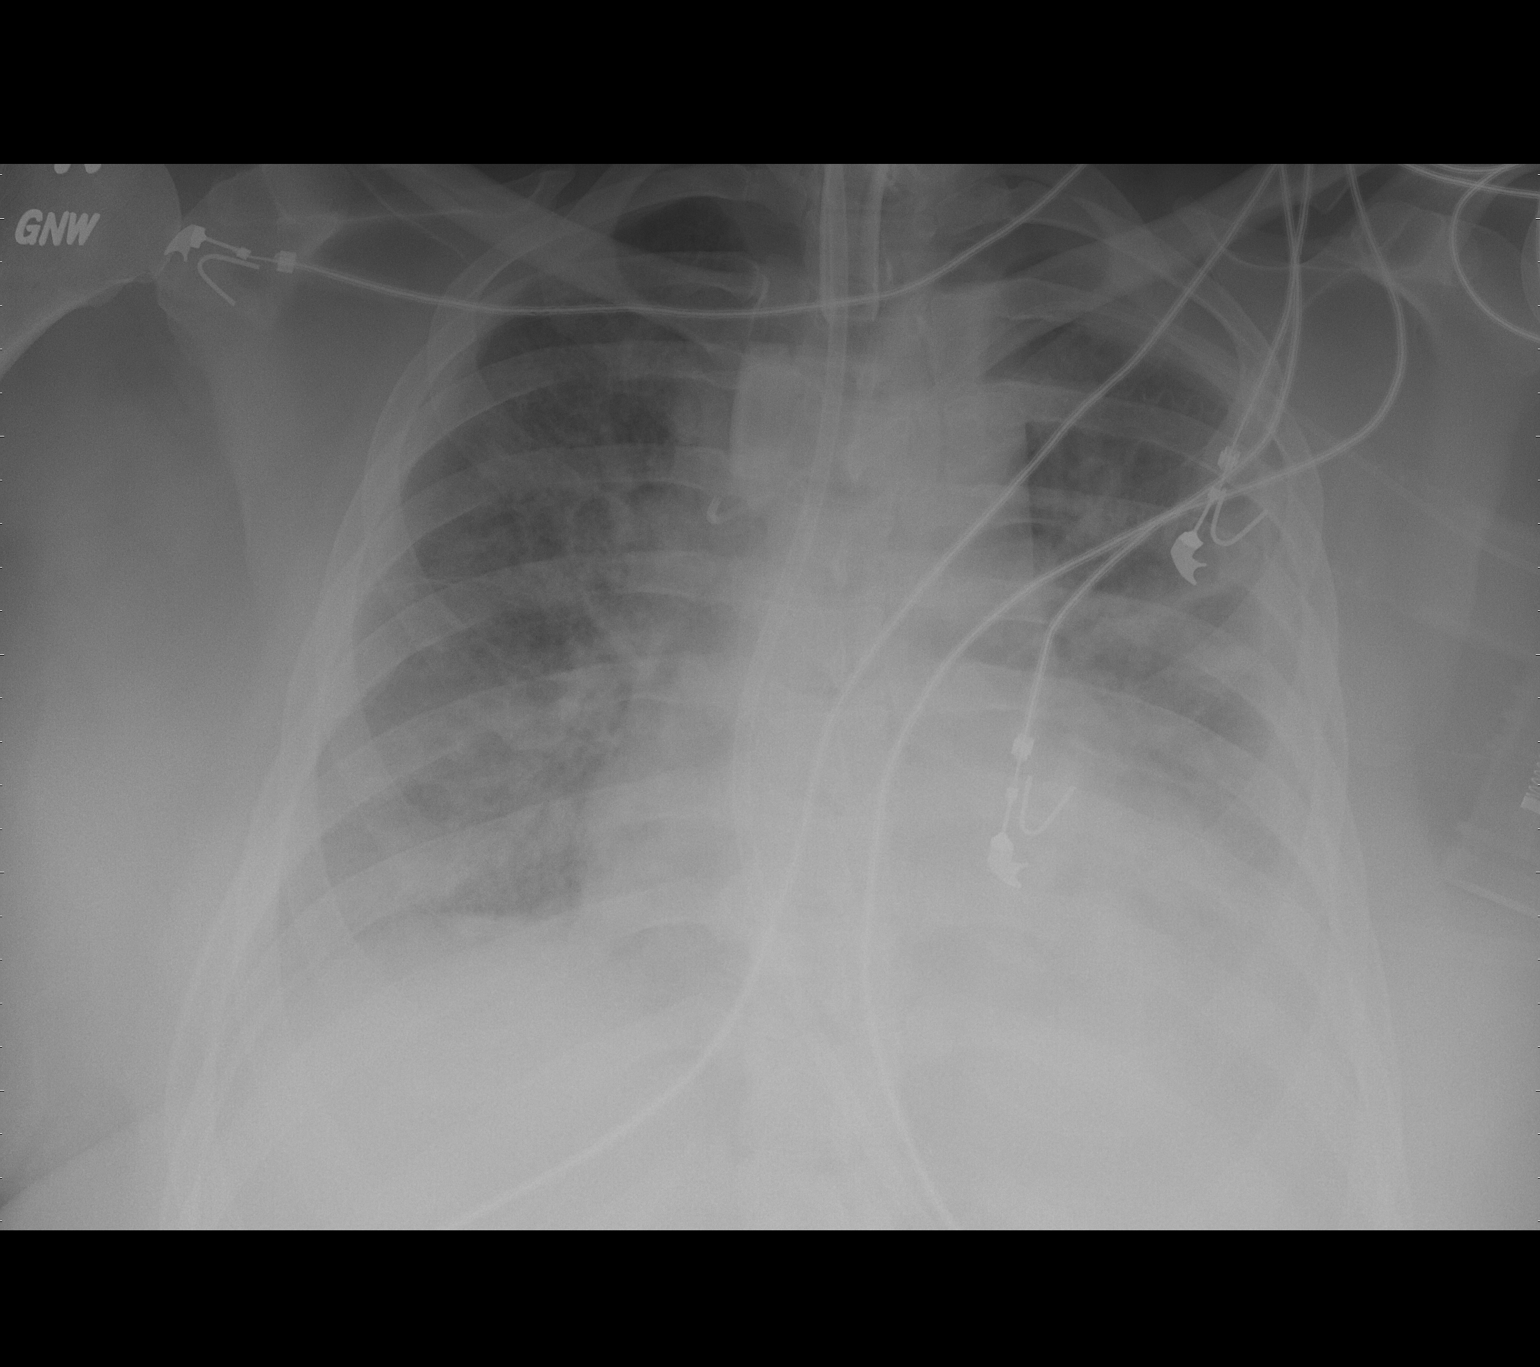

[1 of 1 positions shown; findings below may reference images not displayed]

FINDINGS: There is a left arm PICC line. The tip appears looped
into the azygos vein.

The feeding tube is in place with tip below the field of view.
Tracheostomy tube is identified with tip above the carina.

Left pleural effusion is unchanged from previous exam.  Pulmonary
edema pattern appears increased in the interval.
IMPRESSION: 1.  Worsening interstitial edema compared with previous exam.
2.  Left pleural effusion as before.

## 2013-02-16 IMAGING — CR DG ABD PORTABLE 1V
1 series · 1 of 1 positions shown · non-contrast
Comparison: 10/17/2011

CLINICAL DATA: NG tube

PORTABLE ABDOMEN - 1 VIEW

[AP]
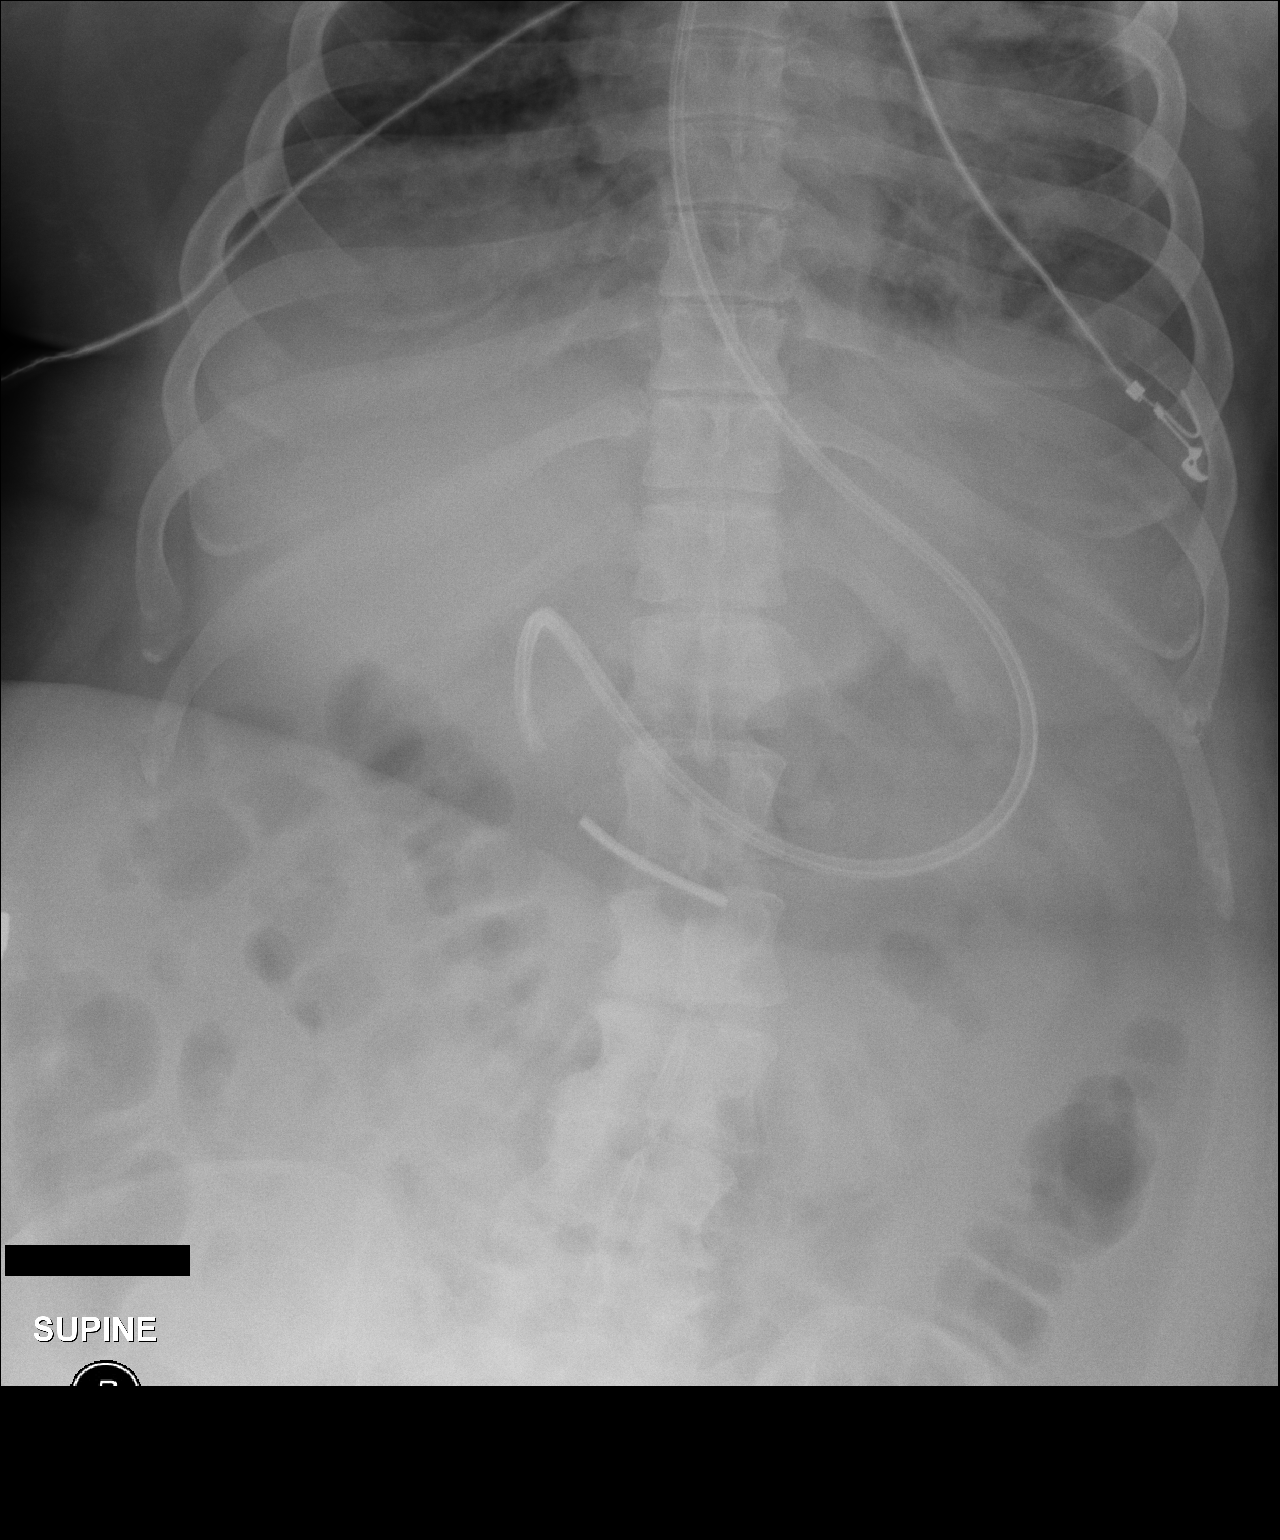

[1 of 1 positions shown; findings below may reference images not displayed]

FINDINGS: Feeding tube tip projects either over the distal stomach
or courses along the C-loop of the duodenum with tip in the third
portion of the duodenum.  Bowel gas pattern nonobstructive.
Bibasilar opacities.
IMPRESSION: Feeding tube tip projects over the distal stomach or third portion
of the duodenum.

## 2013-02-17 ENCOUNTER — Other Ambulatory Visit: Payer: Self-pay | Admitting: Family Medicine

## 2013-02-17 ENCOUNTER — Encounter: Payer: Self-pay | Admitting: Family Medicine

## 2013-02-17 ENCOUNTER — Ambulatory Visit (INDEPENDENT_AMBULATORY_CARE_PROVIDER_SITE_OTHER): Payer: Medicare Other | Admitting: Family Medicine

## 2013-02-17 DIAGNOSIS — H6690 Otitis media, unspecified, unspecified ear: Secondary | ICD-10-CM | POA: Insufficient documentation

## 2013-02-17 LAB — URINE CULTURE

## 2013-02-17 MED ORDER — CEFDINIR 300 MG PO CAPS: 300.0000 mg | ORAL_CAPSULE | Freq: Two times a day (BID) | ORAL | Status: DC

## 2013-02-17 MED ORDER — ALBUTEROL SULFATE HFA 108 (90 BASE) MCG/ACT IN AERS: 2.0000 | INHALATION_SPRAY | Freq: Four times a day (QID) | RESPIRATORY_TRACT | Status: DC | PRN

## 2013-02-17 MED ORDER — CEPHALEXIN 500 MG PO CAPS: 500.0000 mg | ORAL_CAPSULE | Freq: Two times a day (BID) | ORAL | Status: DC

## 2013-02-17 NOTE — Progress Notes (Signed)
Pre-visit discussion using our clinic review tool. No additional management support is needed unless otherwise documented below in the visit note.  Recently with likely UTI with lower abd pain and dysuria. UCx pos, PCP sent abx to pharmacy.  Not on abx yet.    Recently with cough started, dry, no sputum.  Aches, chest is sore.  ST.  No rash.  No fevers.  Had a flu shot last week.  No vomiting.  No ear pain. No rhinorrhea, no facial pain.    Meds, vitals, and allergies reviewed.   ROS: See HPI.  Otherwise, noncontributory.  Down's changes noted.   GEN: nad, alert and oriented HEENT: mucous membranes moist, R tm w/o erythema, L TM erythema noted, nasal exam w/o erythema, clear discharge noted,  OP with cobblestoning NECK: supple w/o LA CV: rrr.   PULM: ctab, no inc wob EXT: no edema SKIN: no acute rash abd- soft but ttp in the suprapubic area

## 2013-02-17 NOTE — Assessment & Plan Note (Signed)
D/c keflex, start omnicef given the ucx and the AOM. Can use SABA with a spacer prn for cough.  ctab but cough noted.  Nontoxic.  F/u prn.  D/w PCP.

## 2013-02-17 NOTE — Patient Instructions (Signed)
Start the [facility] today and use the albuterol with a spacer.  Take care.  Glad to see you.

## 2013-02-25 ENCOUNTER — Telehealth: Payer: Self-pay | Admitting: Family Medicine

## 2013-02-25 DIAGNOSIS — Z0101 Encounter for examination of eyes and vision with abnormal findings: Secondary | ICD-10-CM

## 2013-02-25 NOTE — Telephone Encounter (Signed)
[name] called and said you requested pt to see retinal doctor, Dr. Ashley Royalty.  Mom called to make the appointment but they are requiring a referral.  Mom wants to know if you can fax over the referral to [phone], attention Kim. Thank you.

## 2013-02-26 NOTE — Telephone Encounter (Signed)
plz notify referral placed.   

## 2013-02-27 DIAGNOSIS — H353190 Nonexudative age-related macular degeneration, unspecified eye, stage unspecified: Secondary | ICD-10-CM

## 2013-02-27 HISTORY — DX: Nonexudative age-related macular degeneration, unspecified eye, stage unspecified: H35.3190

## 2013-02-28 NOTE — Telephone Encounter (Signed)
Message left advising patient's mother.  

## 2013-03-12 ENCOUNTER — Encounter: Payer: Self-pay | Admitting: Family Medicine

## 2013-03-14 ENCOUNTER — Other Ambulatory Visit: Payer: Self-pay | Admitting: *Deleted

## 2013-03-16 ENCOUNTER — Other Ambulatory Visit: Payer: Self-pay

## 2013-03-16 MED ORDER — MEMANTINE HCL 10 MG PO TABS: 10.0000 mg | ORAL_TABLET | Freq: Two times a day (BID) | ORAL | Status: DC

## 2013-03-17 ENCOUNTER — Other Ambulatory Visit: Payer: Self-pay | Admitting: *Deleted

## 2013-03-17 MED ORDER — SERTRALINE HCL 50 MG PO TABS: 50.0000 mg | ORAL_TABLET | Freq: Two times a day (BID) | ORAL | Status: DC

## 2013-03-17 MED ORDER — LEVOTHYROXINE SODIUM 125 MCG PO TABS: 125.0000 ug | ORAL_TABLET | Freq: Every day | ORAL | Status: DC

## 2013-03-17 MED ORDER — LEVETIRACETAM 500 MG PO TABS: ORAL_TABLET | ORAL | Status: DC

## 2013-03-17 MED ORDER — PANTOPRAZOLE SODIUM 40 MG PO TBEC: DELAYED_RELEASE_TABLET | ORAL | Status: DC

## 2013-03-17 NOTE — Telephone Encounter (Signed)
Received fax from [facility] requesting 90 day supply.

## 2013-04-16 ENCOUNTER — Telehealth: Payer: Self-pay | Admitting: Pulmonary Disease

## 2013-04-16 ENCOUNTER — Encounter: Payer: Self-pay | Admitting: Internal Medicine

## 2013-04-16 ENCOUNTER — Ambulatory Visit (INDEPENDENT_AMBULATORY_CARE_PROVIDER_SITE_OTHER): Payer: Medicare Other | Admitting: Internal Medicine

## 2013-04-16 VITALS — BP 98/58 | HR 81 | Temp 98.0°F | Wt 238.0 lb

## 2013-04-16 DIAGNOSIS — J069 Acute upper respiratory infection, unspecified: Secondary | ICD-10-CM

## 2013-04-16 DIAGNOSIS — Z8701 Personal history of pneumonia (recurrent): Secondary | ICD-10-CM

## 2013-04-16 NOTE — Telephone Encounter (Signed)
lmtcb x1 

## 2013-04-16 NOTE — Progress Notes (Signed)
HPI  Pt presents to the clinic today with c/o cough, sore throat and nasal congestion. This started 1 week ago. She has been coughing so much that it makes her gag. She has taken OTC Mucinex. She has no history of allergies or asthma.  Review of Systems      Past Medical History  Diagnosis Date  . Down syndrome   . Pneumonia     multiple pneumonias  . Empyema of left pleural space 09/24/2011  . DJD (degenerative joint disease) of knee     bilat knee arthritis, had 3/3 Supartz injections [name])  . Depression     with OCD  . History of diabetes mellitus     during TPN  . Hypothyroid   . Seizure     x 2 during hospitalization  . Dementia due to another medical condition     Trisomy 21-related, sees [name] yearly, nl CT head 2010, did not tolerate aricept  . OSA on CPAP 05/2012    severe OSA with AHI 118, desat nadir to 67%  . Sleep-related hypoventilation 09/23/2012  . Eczema   . Dry ARMD 02/2013    Family History  Problem Relation Age of Onset  . Arthritis Mother   . Hypertension Father   . Diabetes Father   . Cancer Neg Hx   . Coronary artery disease Neg Hx   . Stroke Neg Hx     History   Social History  . Marital Status: Single    Spouse Name: N/A    Number of Children: N/A  . Years of Education: N/A   Occupational History  . Not on file.   Social History Main Topics  . Smoking status: Never Smoker   . Smokeless tobacco: Never Used  . Alcohol Use: No  . Drug Use: No  . Sexual Activity: Not on file   Other Topics Concern  . Not on file   Social History Narrative   Lives with mom and dad, dogs   Activity:    Diet: fruits/vegetables daily    No Known Allergies   Constitutional: Positive headache, fatigue. Denies fever or abrupt weight changes.  HEENT:  Positive nasal congestion, sore throat. Denies eye redness, eye pain, pressure behind the eyes, facial pain,  ear pain, ringing in the ears, wax buildup, runny nose or bloody nose. Respiratory:  Positive cough. Denies difficulty breathing or shortness of breath.  Cardiovascular: Denies chest pain, chest tightness, palpitations or swelling in the hands or feet.   No other specific complaints in a complete review of systems (except as listed in HPI above).  Objective:   BP 98/58  Pulse 81  Temp(Src) 98 F (36.7 C) (Oral)  Wt 238 lb (107.956 kg)  SpO2 98% Wt Readings from Last 3 Encounters:  04/16/13 238 lb (107.956 kg)  02/17/13 213 lb 4 oz (96.73 kg)  02/13/13 235 lb 12 oz (106.935 kg)     General: Appears her stated age, chronically in NAD. HEENT: Head: normal shape and size; Eyes: sclera white, no icterus, conjunctiva pink, PERRLA and EOMs intact; Ears: Tm's gray and intact, normal light reflex; Nose: mucosa pink and moist, septum midline; Throat/Mouth: + PND. Teeth present, mucosa erythematous and moist, no exudate noted, no lesions or ulcerations noted.  Neck: Mild cervical lymphadenopathy. Neck supple, trachea midline. No massses, lumps or thyromegaly present.  Cardiovascular: Normal rate and rhythm. S1,S2 noted.  No murmur, rubs or gallops noted. No JVD or BLE edema. No carotid bruits noted. Pulmonary/Chest: Normal  effort and positive vesicular breath sounds. No respiratory distress. No wheezes, rales or ronchi noted.      Assessment & Plan:   Upper Respiratory Infection:  Get some rest and drink plenty of water Do salt water gargles for the sore throat eRx for Azithromax x 5 days   RTC as needed or if symptoms persist.

## 2013-04-16 NOTE — Patient Instructions (Addendum)

## 2013-04-16 NOTE — Progress Notes (Signed)
Pre-visit discussion using our clinic review tool. No additional management support is needed unless otherwise documented below in the visit note.  

## 2013-04-17 ENCOUNTER — Telehealth: Payer: Self-pay | Admitting: *Deleted

## 2013-04-17 MED ORDER — AZITHROMYCIN 250 MG PO TABS: ORAL_TABLET | ORAL | Status: DC

## 2013-04-17 MED ORDER — ALBUTEROL SULFATE (2.5 MG/3ML) 0.083% IN NEBU: 2.5000 mg | INHALATION_SOLUTION | Freq: Four times a day (QID) | RESPIRATORY_TRACT | Status: DC | PRN

## 2013-04-17 NOTE — Telephone Encounter (Signed)
Needs nebulizer order sent to [facility] as well. She doesn't have a machine at home. She coughs at night and the nebulizer treatment before bed along with the O2 at night seems to help the best.

## 2013-04-17 NOTE — Telephone Encounter (Signed)
Reviewing chart - albuterol inhaler was started for acute otitis media with cough late December for very temporary course. Pt does not have clear indication for needing albuterol (no known h/o asthma or COPD), and this med can have side effects with either intermittent or prolonged use May send in neb machine script (printed script and placed in Kim's box) but pt needs to use sparingly only when short winded or with significant cough, and I want her to discuss this use with Dr. Craige CottaSood at next office visit.

## 2013-04-17 NOTE — Telephone Encounter (Deleted)
Ok to do.  Wrote out neb machine with [facility] neb script and placed in Kim's box.

## 2013-04-17 NOTE — Telephone Encounter (Signed)
Patient's mother called to see if you would send in order for nebulizer. She said patient doesn't understand the concept of the inhaler and it doesn't help her. Dr. Lucious GrovesSoot (pulmonologist) hasn't seen the patient in quite a while and he asked if you would write the Rx for this.

## 2013-04-17 NOTE — Addendum Note (Signed)
Addended by: Lorre MunroeBAITY, REGINA W on: 04/17/2013 02:52 PM   Modules accepted: Orders

## 2013-04-17 NOTE — Telephone Encounter (Signed)
lmtcb

## 2013-04-17 NOTE — Telephone Encounter (Signed)
plz notify this has been sent in.

## 2013-04-18 NOTE — Telephone Encounter (Signed)
Spoke with pt's mother. They have already received neb albuterol from PCP. They need order for neb machine. This has been placed. Nothing further is needed.

## 2013-04-18 NOTE — Telephone Encounter (Signed)
Spoke with patient's mother. She said that Dr. Evlyn CourierSood's office called her this AM and went ahead and sent in an order for the nebulizer, so we didn't need to. Explained your concern and she verbalized understanding.

## 2013-05-27 ENCOUNTER — Encounter: Payer: Self-pay | Admitting: Nurse Practitioner

## 2013-05-30 ENCOUNTER — Ambulatory Visit: Payer: Medicare Other | Admitting: Nurse Practitioner

## 2013-06-05 ENCOUNTER — Other Ambulatory Visit: Payer: Self-pay | Admitting: Family Medicine

## 2013-06-05 ENCOUNTER — Ambulatory Visit: Payer: Medicare Other | Admitting: Nurse Practitioner

## 2013-06-16 ENCOUNTER — Other Ambulatory Visit: Payer: Self-pay | Admitting: Family Medicine

## 2013-06-18 ENCOUNTER — Ambulatory Visit (INDEPENDENT_AMBULATORY_CARE_PROVIDER_SITE_OTHER): Payer: Medicare Other | Admitting: Nurse Practitioner

## 2013-06-18 ENCOUNTER — Telehealth: Payer: Self-pay | Admitting: *Deleted

## 2013-06-18 ENCOUNTER — Telehealth: Payer: Self-pay | Admitting: Family Medicine

## 2013-06-18 VITALS — HR 95 | Temp 98.2°F | Wt 235.0 lb

## 2013-06-18 DIAGNOSIS — L259 Unspecified contact dermatitis, unspecified cause: Secondary | ICD-10-CM

## 2013-06-18 DIAGNOSIS — K612 Anorectal abscess: Secondary | ICD-10-CM

## 2013-06-18 DIAGNOSIS — K611 Rectal abscess: Secondary | ICD-10-CM

## 2013-06-18 MED ORDER — MUPIROCIN CALCIUM 2 % EX CREA: 1.0000 "application " | TOPICAL_CREAM | Freq: Two times a day (BID) | CUTANEOUS | Status: DC

## 2013-06-18 MED ORDER — DOXYCYCLINE HYCLATE 100 MG PO TABS: 100.0000 mg | ORAL_TABLET | Freq: Two times a day (BID) | ORAL | Status: DC

## 2013-06-18 MED ORDER — TRIAMCINOLONE ACETONIDE 0.1 % EX CREA: 1.0000 "application " | TOPICAL_CREAM | Freq: Two times a day (BID) | CUTANEOUS | Status: DC

## 2013-06-18 NOTE — Telephone Encounter (Signed)
Patient Information:  Caller Name: Lurena JoinerRebecca  Phone: [phone]  Patient: [name], [name]  Gender: Female  DOB: 01/04/1971  Age: 43 Years  PCP: Eustaquio BoydenGutierrez, [facility](Family Practice)  Pregnant: No  Office Follow Up:  Does the office need to follow up with this patient?: No  Instructions For The Office: N/A   Symptoms  Reason For Call & Symptoms: Noticed 3 large boils near rectal area onset -06/17/13. Soaked in warm bath last night and Differns Cream applied.  Today boils are draining yellowish bloody drainage-06/18/13. Daycare asked that she be pickup d/t possible MRSA. Dx with MRSA 2013 after hospitalization and wanting to have he checked.  Reviewed Health History In EMR: Yes  Reviewed Medications In EMR: Yes  Reviewed Allergies In EMR: Yes  Reviewed Surgeries / Procedures: Yes  Date of Onset of Symptoms: 06/17/2013  Treatments Tried: Differns Cream  Treatments Tried Worked: Yes OB / GYN:  LMP: 06/04/2013  Guideline(s) Used:  Skin Lesion - Moles or Growths  Disposition Per Guideline:   See Today in Office  Reason For Disposition Reached:   Looks like a boil, infected sore, or deep ulcer  Advice Given:  Call Back If:  Fever or pain occurs  Any change in the mole or growth  You become worse.  Patient Will Follow Care Advice:  YES  Appointment Scheduled:  06/18/2013 15:45:00 Appointment Scheduled Provider:  Other

## 2013-06-18 NOTE — Telephone Encounter (Signed)
Test encounter

## 2013-06-18 NOTE — Patient Instructions (Signed)
Start antibiotic. Clean area twice daily with mild soap & water. Soak in tub daily if able. Apply mupirocin cream twice daily. Follow up in 1 week. Let us know if she develops fever. Peri-Rectal Abscess Your caregiver has diagnosed you as having a peri-rectal abscess. This is an infected area near the rectum that is filled with pus. If the abscess is near the surface of the skin, your caregiver may open (incise) the area and drain the pus. HOME CARE INSTRUCTIONS   If your abscess was opened up and drained. A small piece of gauze may be placed in the opening so that it can drain. Do not remove the gauze unless directed by your caregiver.  A loose dressing may be placed over the abscess site. Change the dressing as often as necessary to keep it clean and dry.  After the drain is removed, the area may be washed with a gentle antiseptic (soap) four times per day.  A warm sitz bath, warm packs or heating pad may be used for pain relief, taking care not to burn yourself.  Return for a wound check in 1 day or as directed.  An "inflatable doughnut" may be used for sitting with added comfort. These can be purchased at a drugstore or medical supply house.  To reduce pain and straining with bowel movements, eat a high fiber diet with plenty of fruits and vegetables. Use stool softeners as recommended by your caregiver. This is especially important if narcotic type pain medications were prescribed as these may cause marked constipation.  Only take over-the-counter or prescription medicines for pain, discomfort, or fever as directed by your caregiver. SEEK IMMEDIATE MEDICAL CARE IF:   You have increasing pain that is not controlled by medication.  There is increased inflammation (redness), swelling, bleeding, or drainage from the area.  An oral temperature above 102 F (38.9 C) develops.  You develop chills or generalized malaise (feel lethargic or feel "washed out").  You develop any new symptoms  (problems) you feel may be related to your present problem. Document Released: 02/11/2000 Document Revised: 05/08/2011 Document Reviewed: 02/11/2008 [facility] Patient Information 2014 [place] Ranch[place]Care, [place].

## 2013-06-18 NOTE — Telephone Encounter (Signed)
Ha appointment today.

## 2013-06-18 NOTE — Progress Notes (Signed)
Pre-visit discussion using our clinic review tool. No additional management support is needed unless otherwise documented below in the visit note.  

## 2013-06-19 ENCOUNTER — Telehealth: Payer: Self-pay | Admitting: Nurse Practitioner

## 2013-06-19 DIAGNOSIS — K611 Rectal abscess: Secondary | ICD-10-CM | POA: Insufficient documentation

## 2013-06-19 DIAGNOSIS — L259 Unspecified contact dermatitis, unspecified cause: Secondary | ICD-10-CM | POA: Insufficient documentation

## 2013-06-19 LAB — CBC
HCT: 41.5 % (ref 36.0–46.0)
Hemoglobin: 13.9 g/dL (ref 12.0–15.0)
MCHC: 33.4 g/dL (ref 30.0–36.0)
MCV: 101.7 fl — ABNORMAL HIGH (ref 78.0–100.0)
Platelets: 224 10*3/uL (ref 150.0–400.0)
RBC: 4.08 Mil/uL (ref 3.87–5.11)
RDW: 14.3 % (ref 11.5–14.6)
WBC: 5.4 10*3/uL (ref 4.5–10.5)

## 2013-06-19 NOTE — Telephone Encounter (Signed)
White cells nml. No change in treatment plan.

## 2013-06-19 NOTE — Progress Notes (Signed)
   Subjective:    Patient ID: Linus Galasammy M Romanoski, female    DOB: November 27, 1970, 43 y.o.   MRN: [id number]  Rash This is a new problem. The current episode started in the past 7 days. The problem is unchanged. Pain location: peri-rectal. The rash is characterized by draining, pain, redness and swelling. Associated with: goes to adult daycare. Pertinent negatives include no diarrhea, fatigue or fever. (Tenderness & draining lesion.) Past treatments include nothing. (MRSA)      Review of Systems  Constitutional: Negative for fever, activity change, appetite change and fatigue.  Gastrointestinal: Positive for rectal pain. Negative for diarrhea.  Skin: Positive for rash.       Objective:   Physical Exam  Vitals reviewed. Constitutional: She is oriented to person, place, and time. She appears well-developed and well-nourished. No distress.  HENT:  Head: Atraumatic.  Downs facies  Eyes: Conjunctivae are normal. Right eye exhibits no discharge. Left eye exhibits no discharge.  Cardiovascular: Normal rate.   Pulmonary/Chest: Effort normal. No respiratory distress.  Genitourinary:  Peri-rectal Lesion adjacent to anus. Fluctuant, approximately 2cm. Draining serous fluid. Tender. Expressed scant amount. Cleansed with saline.   Neurological: She is alert and oriented to person, place, and time.  Skin: Skin is warm and dry. Rash noted.  2 patches back of L hand. Scaly, papules, cracked scant bleeding. Approximately 1 cm each.  Psychiatric: She has a normal mood and affect. Her behavior is normal. Thought content normal.          Assessment & Plan:  2. Perirectal abscess -cbc - doxycycline (VIBRA-TABS) 100 MG tablet; Take 1 tablet (100 mg total) by mouth 2 (two) times daily.  Dispense: 14 tablet; Refill: 0 - mupirocin cream (BACTROBAN) 2 %; Apply 1 application topically 2 (two) times daily.  Dispense: 30 g; Refill: 0 F/u 1 wk  2. Contact dermatitis Back of L hand, likely dermatitis to latex  gloves - triamcinolone cream (KENALOG) 0.1 %; Apply 1 application topically 2 (two) times daily. Use on hand sparingly as can cause bleaching and atrophy.  Dispense: 30 g; Refill: 0

## 2013-06-19 NOTE — Telephone Encounter (Signed)
Patient mother Lurena JoinerRebecca was given lab results.

## 2013-06-26 ENCOUNTER — Encounter: Payer: Self-pay | Admitting: Internal Medicine

## 2013-06-26 ENCOUNTER — Ambulatory Visit (INDEPENDENT_AMBULATORY_CARE_PROVIDER_SITE_OTHER): Payer: Medicare Other | Admitting: Internal Medicine

## 2013-06-26 VITALS — BP 98/60 | HR 79 | Temp 97.8°F | Wt 232.0 lb

## 2013-06-26 DIAGNOSIS — L309 Dermatitis, unspecified: Secondary | ICD-10-CM

## 2013-06-26 DIAGNOSIS — L259 Unspecified contact dermatitis, unspecified cause: Secondary | ICD-10-CM

## 2013-06-26 DIAGNOSIS — K611 Rectal abscess: Secondary | ICD-10-CM

## 2013-06-26 DIAGNOSIS — K612 Anorectal abscess: Secondary | ICD-10-CM

## 2013-06-26 NOTE — Progress Notes (Signed)
Subjective:    Patient ID: [name], female    DOB: 05/03/1970, 43 y.o.   MRN: [id number]  HPI  Pt present to the clinic to followup abscess and rash. She has had a abscess in between her buttock about 2 weeks ago. She was seen for the same on 06/18/13, given doxycycline and mupiricin. Since that time, her mother has noticed that it drained. It is not red, but still very tender to touch. She denies fever, chills or body aches.  Additionally, she reports that the rash on her hands is better with the triamcinolone cream. It is still itchy at times but her mom reports that she also has OCD and wash her hands multiple times per day. They are using lotion but only about twice daily.   Review of Systems      Past Medical History  Diagnosis Date  . Down syndrome   . Pneumonia     multiple pneumonias  . Empyema of left pleural space 09/24/2011  . DJD (degenerative joint disease) of knee     bilat knee arthritis, had 3/3 Supartz injections [name](Kramer)  . Depression     with OCD  . History of diabetes mellitus     during TPN  . Hypothyroid   . Seizure     x 2 during hospitalization  . Dementia due to another medical condition     Trisomy 21-related, sees Pearlean BrownieSethi yearly, nl CT head 2010, did not tolerate aricept  . OSA on CPAP 05/2012    severe OSA with AHI 118, desat nadir to 67%  . Sleep-related hypoventilation 09/23/2012  . Eczema   . 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 albuterol (PROVENTIL) (2.5 MG/3ML) 0.083% nebulizer solution Take 3 mLs (2.5 mg total) by nebulization every 6 (six) hours as needed for wheezing or shortness of breath.  75 mL  0  . levETIRAcetam (KEPPRA) 500 MG tablet TAKE 1 TABLET (500 MG TOTAL) BY MOUTH EVERY 12 (TWELVE) HOURS.  180 tablet  1  . levothyroxine (SYNTHROID, LEVOTHROID) 125 MCG tablet TAKE 1 TABLET BY MOUTH DAILY BEFORE BREAKFAST.  90 tablet  1  . LORazepam (ATIVAN) 0.5 MG tablet Take 1 tablet (0.5 mg  total) by mouth daily as needed for anxiety.  20 tablet  0  . memantine (NAMENDA) 10 MG tablet Take 1 tablet (10 mg total) by mouth 2 (two) times daily.  180 tablet  0  . Multiple Vitamins-Minerals (WOMENS MULTI PO) Take by mouth daily.      . mupirocin cream (BACTROBAN) 2 % Apply 1 application topically 2 (two) times daily.  30 g  0  . pantoprazole (PROTONIX) 40 MG tablet TAKE 1 TABLET (40 MG TOTAL) BY MOUTH DAILY.  30 tablet  5  . triamcinolone cream (KENALOG) 0.1 % Apply 1 application topically 2 (two) times daily. Use on hand sparingly as can cause bleaching and atrophy.  30 g  0   No current facility-administered medications for this visit.    No Known Allergies  Family History  Problem Relation Age of Onset  . Arthritis Mother   . Hypertension Father   . Diabetes Father   . Cancer Neg Hx   . Coronary artery disease Neg Hx   . Stroke Neg Hx     History   Social History  . Marital Status: Single    Spouse Name: N/A    Number of Children: N/A  . Years  of Education: N/A   Occupational History  . Not on file.   Social History Main Topics  . Smoking status: Never Smoker   . Smokeless tobacco: Never Used  . Alcohol Use: No  . Drug Use: No  . Sexual Activity: Not on file   Other Topics Concern  . Not on file   Social History Narrative   Lives with mom and dad, dogs   Activity:    Diet: fruits/vegetables daily     Constitutional: Denies fever, malaise, fatigue, headache or abrupt weight changes.  Skin: Pt reports rash and abscess of buttock. Denies redness, ulcercations.    No other specific complaints in a complete review of systems (except as listed in HPI above).  Objective:   Physical Exam  BP 98/60  Pulse 79  Temp(Src) 97.8 F (36.6 C) (Oral)  Wt 232 lb (105.235 kg)  SpO2 97% Wt Readings from Last 3 Encounters:  06/26/13 232 lb (105.235 kg)  06/18/13 235 lb (106.595 kg)  04/16/13 238 lb (107.956 kg)    General: Appears herr stated age, obese  but  well developed, well nourished in NAD. Skin: Warm, dry and intact. Small dime size abscess noted on right buttock, not infected, no cellulitis. Eczematous dermatitis noted on bilateral hands, L>R.    BMET    Component Value Date/Time   NA 137 02/06/2013 0812   K 4.3 02/06/2013 0812   CL 103 02/06/2013 0812   CO2 29 02/06/2013 0812   GLUCOSE 103* 02/06/2013 0812   BUN 16 02/06/2013 0812   CREATININE 1.2 02/06/2013 0812   CALCIUM 8.6 02/06/2013 0812   GFRNONAA 64* 11/02/2011 0440   GFRAA 75* 11/02/2011 0440    Lipid Panel     Component Value Date/Time   CHOL 179 02/06/2013 0812   TRIG 97.0 02/06/2013 0812   HDL 53.90 02/06/2013 0812   CHOLHDL 3 02/06/2013 0812   VLDL 19.4 02/06/2013 0812   LDLCALC 106* 02/06/2013 0812    CBC    Component Value Date/Time   WBC 5.4 06/18/2013 1658   RBC 4.08 06/18/2013 1658   HGB 13.9 06/18/2013 1658   HCT 41.5 06/18/2013 1658   PLT 224.0 06/18/2013 1658   MCV 101.7* 06/18/2013 1658   MCH 32.7 11/02/2011 0440   MCHC 33.4 06/18/2013 1658   RDW 14.3 06/18/2013 1658   LYMPHSABS 1.4 02/06/2013 0812   MONOABS 0.5 02/06/2013 0812   EOSABS 0.1 02/06/2013 0812   BASOSABS 0.0 02/06/2013 0812    Hgb A1C Lab Results  Component Value Date   HGBA1C 5.6 02/06/2013         Assessment & Plan:   Perirectal abscess:  Seems to be healing well She has finished the course of doxycycline Encouraged her to continue mupiricin cream Watch for increased redness, swelling or pain  Eczematous dermatitis:  Improving with triamcinolone cream Continue for now Encouraged using lotion after washing hands  RTC as needed or if symptoms persist or worsen

## 2013-06-26 NOTE — Progress Notes (Signed)
Pre visit review using our clinic review tool, if applicable. No additional management support is needed unless otherwise documented below in the visit note. 

## 2013-06-26 NOTE — Patient Instructions (Addendum)
Abscess An abscess is an infected area that contains a collection of pus and debris.It can occur in almost any part of the body. An abscess is also known as a furuncle or boil. CAUSES  An abscess occurs when tissue gets infected. This can occur from blockage of oil or sweat glands, infection of hair follicles, or a minor injury to the skin. As the body tries to fight the infection, pus collects in the area and creates pressure under the skin. This pressure causes pain. People with weakened immune systems have difficulty fighting infections and get certain abscesses more often.  SYMPTOMS Usually an abscess develops on the skin and becomes a painful mass that is red, warm, and tender. If the abscess forms under the skin, you may feel a moveable soft area under the skin. Some abscesses break open (rupture) on their own, but most will continue to get worse without care. The infection can spread deeper into the body and eventually into the bloodstream, causing you to feel ill.  DIAGNOSIS  Your caregiver will take your medical history and perform a physical exam. A sample of fluid may also be taken from the abscess to determine what is causing your infection. TREATMENT  Your caregiver may prescribe antibiotic medicines to fight the infection. However, taking antibiotics alone usually does not cure an abscess. Your caregiver may need to make a small cut (incision) in the abscess to drain the pus. In some cases, gauze is packed into the abscess to reduce pain and to continue draining the area. HOME CARE INSTRUCTIONS   Only take over-the-counter or prescription medicines for pain, discomfort, or fever as directed by your caregiver.  If you were prescribed antibiotics, take them as directed. Finish them even if you start to feel better.  If gauze is used, follow your caregiver's directions for changing the gauze.  To avoid spreading the infection:  Keep your draining abscess covered with a  bandage.  Wash your hands well.  Do not share personal care items, towels, or whirlpools with others.  Avoid skin contact with others.  Keep your skin and clothes clean around the abscess.  Keep all follow-up appointments as directed by your caregiver. SEEK MEDICAL CARE IF:   You have increased pain, swelling, redness, fluid drainage, or bleeding.  You have muscle aches, chills, or a general ill feeling.  You have a fever. MAKE SURE YOU:   Understand these instructions.  Will watch your condition.  Will get help right away if you are not doing well or get worse. Document Released: 11/23/2004 Document Revised: 08/15/2011 Document Reviewed: 04/28/2011 ExitCare Patient Information 2014 [employer]. Abscess An abscess is an infected area that contains a collection of pus and debris.It can occur in almost any part of the body. An abscess is also known as a furuncle or boil. CAUSES  An abscess occurs when tissue gets infected. This can occur from blockage of oil or sweat glands, infection of hair follicles, or a minor injury to the skin. As the body tries to fight the infection, pus collects in the area and creates pressure under the skin. This pressure causes pain. People with weakened immune systems have difficulty fighting infections and get certain abscesses more often.  SYMPTOMS Usually an abscess develops on the skin and becomes a painful mass that is red, warm, and tender. If the abscess forms under the skin, you may feel a moveable soft area under the skin. Some abscesses break open (rupture) on their own, but most   will continue to get worse without care. The infection can spread deeper into the body and eventually into the bloodstream, causing you to feel ill.  DIAGNOSIS  Your caregiver will take your medical history and perform a physical exam. A sample of fluid may also be taken from the abscess to determine what is causing your infection. TREATMENT  Your caregiver may  prescribe antibiotic medicines to fight the infection. However, taking antibiotics alone usually does not cure an abscess. Your caregiver may need to make a small cut (incision) in the abscess to drain the pus. In some cases, gauze is packed into the abscess to reduce pain and to continue draining the area. HOME CARE INSTRUCTIONS   Only take over-the-counter or prescription medicines for pain, discomfort, or fever as directed by your caregiver.  If you were prescribed antibiotics, take them as directed. Finish them even if you start to feel better.  If gauze is used, follow your caregiver's directions for changing the gauze.  To avoid spreading the infection:  Keep your draining abscess covered with a bandage.  Wash your hands well.  Do not share personal care items, towels, or whirlpools with others.  Avoid skin contact with others.  Keep your skin and clothes clean around the abscess.  Keep all follow-up appointments as directed by your caregiver. SEEK MEDICAL CARE IF:   You have increased pain, swelling, redness, fluid drainage, or bleeding.  You have muscle aches, chills, or a general ill feeling.  You have a fever. MAKE SURE YOU:   Understand these instructions.  Will watch your condition.  Will get help right away if you are not doing well or get worse. Document Released: 11/23/2004 Document Revised: 08/15/2011 Document Reviewed: 04/28/2011 ExitCare Patient Information 2014 [employer].  

## 2013-06-27 ENCOUNTER — Telehealth: Payer: Self-pay | Admitting: Family Medicine

## 2013-06-27 NOTE — Telephone Encounter (Signed)
Left vm for pt's mother to return call

## 2013-06-27 NOTE — Telephone Encounter (Signed)
plz get details for this - what fax #, what does [employer] stand for, what exactly does she need - materials and quantity

## 2013-06-27 NOTE — Telephone Encounter (Signed)
[name] left vm requesting written RX for [employer] to continue to supply them with gloves and incontinence pads for pt.

## 2013-06-27 NOTE — Telephone Encounter (Signed)
Company is actually called [employer]- they are a company that helps when pt's are on disability such as adult daycare, supplies, etc. She needs gloves and incontinence pads.  They determine the allotted amount. She does not have their fax number.  She requests that it be mailed to her if possible (address on file is correct) or she can pick it up if not.

## 2013-06-28 NOTE — Telephone Encounter (Signed)
Filled and placed in Kim's box. 

## 2013-06-30 NOTE — Telephone Encounter (Signed)
Rx mailed to patient's mother as requested. Message left advising same.

## 2013-07-01 ENCOUNTER — Ambulatory Visit (INDEPENDENT_AMBULATORY_CARE_PROVIDER_SITE_OTHER): Payer: Medicare Other | Admitting: Ophthalmology

## 2013-07-01 DIAGNOSIS — H3090 Unspecified chorioretinal inflammation, unspecified eye: Secondary | ICD-10-CM

## 2013-07-01 DIAGNOSIS — H35359 Cystoid macular degeneration, unspecified eye: Secondary | ICD-10-CM

## 2013-07-01 DIAGNOSIS — H43819 Vitreous degeneration, unspecified eye: Secondary | ICD-10-CM

## 2013-07-01 DIAGNOSIS — H2510 Age-related nuclear cataract, unspecified eye: Secondary | ICD-10-CM

## 2013-07-02 ENCOUNTER — Telehealth: Payer: Self-pay | Admitting: Family Medicine

## 2013-07-02 MED ORDER — DOXYCYCLINE HYCLATE 100 MG PO CAPS: 100.0000 mg | ORAL_CAPSULE | Freq: Two times a day (BID) | ORAL | Status: DC

## 2013-07-02 NOTE — Telephone Encounter (Signed)
Patients mother notified and verbalized understanding 

## 2013-07-02 NOTE — Telephone Encounter (Signed)
Patient Information:  Caller Name: Lurena JoinerRebecca  Phone: [phone]  Patient: [name], [name]  Gender: Female  DOB: 01/08/1971  Age: 43 Years  PCP: Eustaquio BoydenGutierrez, Javier [facility](Family Practice)  Pregnant: No  Office Follow Up:  Does the office need to follow up with this patient?: Yes  Instructions For The Office: Mom declines appt. Mom is requesting a Rx for antibiotic to be called into [facility] [address] at [phone]. Mom can be reached at [phone].  RN Note:  Mom states patient has history of Perirectal abscess. Mom states she noted 2 raised, red, painful areas near rectum 07/01/13. States areas are approx. the size of a grape. States areas are located in the fold of the buttock. States one area appears to have a pustule in the center. No drainage noted. Afebrile. Urinating normally for patient. Last bowel movement 07/01/13, normal for patient. Care advice given per guidelines. Call back parameters reviewed. Mom verbalizes understanding.  Symptoms  Reason For Call & Symptoms: Abscess near rectal area  Reviewed Health History In EMR: Yes  Reviewed Medications In EMR: Yes  Reviewed Allergies In EMR: Yes  Reviewed Surgeries / Procedures: Yes  Date of Onset of Symptoms: 07/01/2013  Treatments Tried: Warm water soaks  Treatments Tried Worked: No OB / GYN:  LMP: 06/12/2013  Guideline(s) Used:  Rectal Symptoms  Disposition Per Guideline:   See Today in Office  Reason For Disposition Reached:   Moderate-Severe rectal pain (i.e., interferes with school, work, or sleep)  Advice Given:  Treatment of Acute Rectal Pain due to Fecal Impaction:   SITZ Bath - Take a 20-minute bath in warm water. Add 2 oz (57 grams) of baking soda to the bath water. This is also called a "Sitz bath" and it often helps relax the anal sphincter and release the bowel movement (BM).  Patient Refused Recommendation:  Patient Requests Prescription  Mom declines appt. Mom is requesting a Rx for  antibiotic to be called into [facility] on [address] at [phone]. Mom can be reached at [phone].

## 2013-07-02 NOTE — Telephone Encounter (Signed)
Seen 4/22 then 4/30 for same issue.  Treated initially with doxycycline and mupirocin cream.  Area drained and improved.   Now has returned. Will do another doxycycline course for 10 days - sent to pharmacy. Continue warm sitz baths and warm compresses. Pt will need to return for evaluation if not improving with this or any worsening pain, or systemic sxs like fever, nausea/vomiting.

## 2013-07-22 ENCOUNTER — Other Ambulatory Visit: Payer: Self-pay | Admitting: Neurology

## 2013-07-25 ENCOUNTER — Ambulatory Visit (INDEPENDENT_AMBULATORY_CARE_PROVIDER_SITE_OTHER): Payer: Medicare Other | Admitting: Family Medicine

## 2013-07-25 ENCOUNTER — Encounter: Payer: Self-pay | Admitting: Family Medicine

## 2013-07-25 VITALS — BP 96/58 | HR 58 | Temp 98.0°F | Wt 235.2 lb

## 2013-07-25 DIAGNOSIS — L0231 Cutaneous abscess of buttock: Secondary | ICD-10-CM

## 2013-07-25 DIAGNOSIS — L03317 Cellulitis of buttock: Principal | ICD-10-CM

## 2013-07-25 MED ORDER — CLINDAMYCIN PHOSPHATE 1 % EX SOLN: Freq: Two times a day (BID) | CUTANEOUS | Status: DC

## 2013-07-25 NOTE — Progress Notes (Signed)
   BP 96/58  Pulse 58  Temp(Src) 98 F (36.7 C) (Oral)  Wt 235 lb 4 oz (106.709 kg)  SpO2 95%   CC: recurrent skin infections  Subjective:    Patient ID: [name], female    DOB: April 05, 1970, 43 y.o.   MRN: [id number]  HPI: [name] is a 43 y.o. female presenting on 07/25/2013 for Recurrent Skin Infections   Seen 4/22 then 4/30 for perirectal abscess/boils. Treated initially with doxycycline and mupirocin cream. Area drained and improved on its own. Then early 06/2013 did another doxycycline course for recurrent drainage. This did not help. Denies fevers/chills, nausea or other systemic symptoms.  Relevant past medical, surgical, family and social history reviewed and updated as indicated.  Allergies and medications reviewed and updated. Current Outpatient Prescriptions on File Prior to Visit  Medication Sig  . albuterol (PROVENTIL) (2.5 MG/3ML) 0.083% nebulizer solution Take 3 mLs (2.5 mg total) by nebulization every 6 (six) hours as needed for wheezing or shortness of breath.  . levETIRAcetam (KEPPRA) 500 MG tablet TAKE 1 TABLET (500 MG TOTAL) BY MOUTH EVERY 12 (TWELVE) HOURS.  Marland Kitchen levothyroxine (SYNTHROID, LEVOTHROID) 125 MCG tablet TAKE 1 TABLET BY MOUTH DAILY BEFORE BREAKFAST.  . Multiple Vitamins-Minerals (WOMENS MULTI PO) Take by mouth daily.  Marland Kitchen NAMENDA 10 MG tablet TAKE 1 TABLET BY MOUTH TWICE A DAY.  Marland Kitchen triamcinolone cream (KENALOG) 0.1 % Apply 1 application topically 2 (two) times daily. Use on hand sparingly as can cause bleaching and atrophy.   No current facility-administered medications on file prior to visit.    Review of Systems Per HPI unless specifically indicated above    Objective:    BP 96/58  Pulse 58  Temp(Src) 98 F (36.7 C) (Oral)  Wt 235 lb 4 oz (106.709 kg)  SpO2 95%  Physical Exam  Nursing note and vitals reviewed. Constitutional: She appears well-developed and well-nourished. No distress.  Genitourinary: Rectal exam shows external  hemorrhoid.     Left medial buttock with induration no fluctuance and three small wounds about 6 cm from anus but unable to express significant drainage from them   Results for orders placed in visit on 06/18/13  CBC      Result Value Ref Range   WBC 5.4  4.5 - 10.5 K/uL   RBC 4.08  3.87 - 5.11 Mil/uL   Platelets 224.0  150.0 - 400.0 K/uL   Hemoglobin 13.9  12.0 - 15.0 g/dL   HCT 88.2  80.0 - 34.9 %   MCV 101.7 (*) 78.0 - 100.0 fl   MCHC 33.4  30.0 - 36.0 g/dL   RDW 17.9  15.0 - 56.9 %      Assessment & Plan:   Problem List Items Addressed This Visit   Cellulitis and abscess of buttock - Primary     Slight drainage from distal wound - culture collected. Concern for developing hydradenitis skin changes. Will treat with clinda 1% solution bid for next 1-2 months, await culture results and consider another PO abx course. Mom agrees with plan. Encouraged warm sitz baths, keeping area clean/dry, and weight loss.    Relevant Orders      Wound culture       Follow up plan: Return if symptoms worsen or fail to improve.

## 2013-07-25 NOTE — Assessment & Plan Note (Addendum)
Slight drainage from distal wound - culture collected. Concern for developing hydradenitis skin changes. Will treat with clinda 1% solution bid for next 1-2 months, await culture results and consider another PO abx course. Mom agrees with plan. Encouraged warm sitz baths, keeping area clean/dry, and weight loss.

## 2013-07-25 NOTE — Patient Instructions (Signed)
Let's try topical clindamycin twice daily to area. Continue warm tub soaks. Keep area clean and dry otherwise. We will call you with culture results.

## 2013-07-25 NOTE — Progress Notes (Signed)
Pre visit review using our clinic review tool, if applicable. No additional management support is needed unless otherwise documented below in the visit note. 

## 2013-07-28 LAB — WOUND CULTURE
GRAM STAIN: NONE SEEN
Gram Stain: NONE SEEN
Gram Stain: NONE SEEN

## 2013-09-04 ENCOUNTER — Encounter: Payer: Self-pay | Admitting: Family Medicine

## 2013-09-04 ENCOUNTER — Ambulatory Visit (INDEPENDENT_AMBULATORY_CARE_PROVIDER_SITE_OTHER): Payer: Medicare Other | Admitting: Family Medicine

## 2013-09-04 VITALS — BP 116/68 | HR 72 | Temp 98.4°F | Wt 231.5 lb

## 2013-09-04 DIAGNOSIS — R21 Rash and other nonspecific skin eruption: Secondary | ICD-10-CM

## 2013-09-04 DIAGNOSIS — L02439 Carbuncle of limb, unspecified: Secondary | ICD-10-CM

## 2013-09-04 DIAGNOSIS — L02429 Furuncle of limb, unspecified: Secondary | ICD-10-CM

## 2013-09-04 DIAGNOSIS — H6120 Impacted cerumen, unspecified ear: Secondary | ICD-10-CM

## 2013-09-04 DIAGNOSIS — H6123 Impacted cerumen, bilateral: Secondary | ICD-10-CM

## 2013-09-04 MED ORDER — CLOBETASOL PROPIONATE 0.05 % EX CREA: 1.0000 "application " | TOPICAL_CREAM | Freq: Two times a day (BID) | CUTANEOUS | Status: DC

## 2013-09-04 NOTE — Patient Instructions (Signed)
Recommend warm compress for boil below R arm. If not better, may start clindamycin lotion. For rash - continue eucerin moisturizer daily, add on clobetasol cream to skin for 2 weeks at a time then stop. If not better with steroid cream let me know for dermatology referral. Ears cleaned today.

## 2013-09-04 NOTE — Progress Notes (Signed)
Pre visit review using our clinic review tool, if applicable. No additional management support is needed unless otherwise documented below in the visit note. 

## 2013-09-04 NOTE — Progress Notes (Signed)
BP 116/68  Pulse 72  Temp(Src) 98.4 F (36.9 C) (Oral)  Wt 231 lb 8 oz (105.008 kg)  LMP 08/14/2013   CC: ears stopped up, check boil  Subjective:    Patient ID: [name], female    DOB: Jun 20, 1970, 43 y.o.   MRN: [id number]  HPI: [name] is a 43 y.o. female presenting on 09/04/2013 for ears stopped up and Recurrent Skin Infections   Check boil under R arm - rec warm compresses and antibiotic ointment. If not improved may use clindamycin lotion.  Hand dermatitis and scalp dermatitis - mom would like referral to dermatologist. Ran out of TCI cream.  Would like ears cleaned today.  Wt Readings from Last 3 Encounters:  09/04/13 231 lb 8 oz (105.008 kg)  07/25/13 235 lb 4 oz (106.709 kg)  06/26/13 232 lb (105.235 kg)    Relevant past medical, surgical, family and social history reviewed and updated as indicated.  Allergies and medications reviewed and updated. Current Outpatient Prescriptions on File Prior to Visit  Medication Sig  . levETIRAcetam (KEPPRA) 500 MG tablet TAKE 1 TABLET (500 MG TOTAL) BY MOUTH EVERY 12 (TWELVE) HOURS.  Marland Kitchen levothyroxine (SYNTHROID, LEVOTHROID) 125 MCG tablet TAKE 1 TABLET BY MOUTH DAILY BEFORE BREAKFAST.  . Multiple Vitamins-Minerals (WOMENS MULTI PO) Take by mouth daily.  Marland Kitchen NAMENDA 10 MG tablet TAKE 1 TABLET BY MOUTH TWICE A DAY.  Marland Kitchen albuterol (PROVENTIL) (2.5 MG/3ML) 0.083% nebulizer solution Take 3 mLs (2.5 mg total) by nebulization every 6 (six) hours as needed for wheezing or shortness of breath.  . pantoprazole (PROTONIX) 40 MG tablet TAKE 1 TABLET (40 MG TOTAL) BY MOUTH every other day  . triamcinolone cream (KENALOG) 0.1 % Apply 1 application topically 2 (two) times daily. Use on hand sparingly as can cause bleaching and atrophy.   No current facility-administered medications on file prior to visit.    Review of Systems Per HPI unless specifically indicated above    Objective:    BP 116/68  Pulse 72  Temp(Src) 98.4 F (36.9  C) (Oral)  Wt 231 lb 8 oz (105.008 kg)  LMP 08/14/2013  Physical Exam  Nursing note and vitals reviewed. Constitutional: She appears well-developed and well-nourished. No distress.  obese  HENT:  Right Ear: Hearing and ear canal normal.  Left Ear: Hearing and ear canal normal.  Mouth/Throat: Oropharynx is clear and moist. No oropharyngeal exudate.  Cerumen impaction bilaterally with hard dry cerumen Disimpaction attempted with plastic curette, unsuccessfully. Irrigation performed by my CMA x2 successfully. Residual dry skin/wax removed with alligator forceps from R ear. Pt tolerated well. Afterwards, R TM clear, L TM congested  Neck: Normal range of motion. Neck supple.  Cardiovascular: Normal rate, regular rhythm, normal heart sounds and intact distal pulses.   No murmur heard. Pulmonary/Chest: Effort normal and breath sounds normal. No respiratory distress. She has no wheezes. She has no rales.  Skin: Rash noted.  L>R dorsal hands with dry thickened skin, pruritic. Some erythematous patches on scalp Erythematous healing pustules at R axilla, no induration or fluctuance       Assessment & Plan:   Problem List Items Addressed This Visit   Skin rash     Hand dermatitis consistent with dyshidrotic eczema.  Discussed skin care including regular moisturizer, avoiding hot showers, and will prescribe temovate cream (discussed steroid holidays for skin). If no better, would refer to dermatology. Caregiver agrees with plan.    Cerumen impaction - Primary  Successful irrigation/disimpaction. Pt tolerated well. Afterwards, evidence of L serous otitis. Will need continued monitoring.    Boil, axilla     Nothing to drain, no need for oral abx. Discussed warm compresses. If persistent, may try clindamycin lotion she has at home.        Follow up plan: Return if symptoms worsen or fail to improve.

## 2013-09-05 DIAGNOSIS — L301 Dyshidrosis [pompholyx]: Secondary | ICD-10-CM | POA: Insufficient documentation

## 2013-09-05 DIAGNOSIS — L02429 Furuncle of limb, unspecified: Secondary | ICD-10-CM | POA: Insufficient documentation

## 2013-09-05 DIAGNOSIS — H6120 Impacted cerumen, unspecified ear: Secondary | ICD-10-CM | POA: Insufficient documentation

## 2013-09-05 NOTE — Assessment & Plan Note (Signed)
Hand dermatitis consistent with dyshidrotic eczema.  Discussed skin care including regular moisturizer, avoiding hot showers, and will prescribe temovate cream (discussed steroid holidays for skin). If no better, would refer to dermatology. Caregiver agrees with plan.

## 2013-09-05 NOTE — Assessment & Plan Note (Signed)
Nothing to drain, no need for oral abx. Discussed warm compresses. If persistent, may try clindamycin lotion she has at home.

## 2013-09-05 NOTE — Assessment & Plan Note (Signed)
Successful irrigation/disimpaction. Pt tolerated well. Afterwards, evidence of L serous otitis. Will need continued monitoring.

## 2013-09-15 ENCOUNTER — Telehealth: Payer: Self-pay | Admitting: Pulmonary Disease

## 2013-09-15 NOTE — Telephone Encounter (Signed)
Error pt mother wanted appointment.Caren GriffinsStanley A Dalton

## 2013-09-17 ENCOUNTER — Encounter: Payer: Self-pay | Admitting: Pulmonary Disease

## 2013-09-17 ENCOUNTER — Ambulatory Visit: Payer: Medicare Other | Admitting: Nurse Practitioner

## 2013-09-17 ENCOUNTER — Ambulatory Visit (INDEPENDENT_AMBULATORY_CARE_PROVIDER_SITE_OTHER): Payer: Medicare Other | Admitting: Pulmonary Disease

## 2013-09-17 VITALS — BP 102/60 | HR 68 | Temp 97.8°F | Ht <= 58 in | Wt 236.6 lb

## 2013-09-17 DIAGNOSIS — Z9989 Dependence on other enabling machines and devices: Principal | ICD-10-CM

## 2013-09-17 DIAGNOSIS — G4739 Other sleep apnea: Secondary | ICD-10-CM

## 2013-09-17 DIAGNOSIS — G4733 Obstructive sleep apnea (adult) (pediatric): Secondary | ICD-10-CM

## 2013-09-17 DIAGNOSIS — G4736 Sleep related hypoventilation in conditions classified elsewhere: Secondary | ICD-10-CM

## 2013-09-17 DIAGNOSIS — IMO0002 Reserved for concepts with insufficient information to code with codable children: Secondary | ICD-10-CM

## 2013-09-17 NOTE — Progress Notes (Signed)
  Chief Complaint  Patient presents with  . Follow-up    Pt c/o persistent cough and dry/itchy throat x 1 month. Pt also states that she has been choking very easily recently when eating. Saw PCP 09/04/13-lungs clear.. Feels possibly d/t CPAP or new oxygen concentrator.     History of Present Illness: [name] is a 43 y.o. female with severe OSA.  She has prior hx of pneumonia, empyema, and respiratory failure requiring tracheostomy.  She is accompanied by her mother.  She has noticed waking up at night with a cough.  She does not bring up sputum.  She denies wheeze, post nasal drip, or reflux.  She is not having trouble with swallowing.  She is needing to use more water in her CPAP humidifier.  She recently got a new mask, and is not aware of any issues with her mask.  Tests: Echo 10/09/11 >> EF 60 to 65% ONO with RA 01/31/12>>Test time 6 hrs 1 min.  Mean SpO2 82%, low SpO2 59%.  Spent 4 hrs 16 min with SpO2 < 88%. ONO with 2L 04/02/12 >> Test time 7 hrs 53 min.  Mean SpO2 92.4%, low SpO2 71%.  Spent 1 hr 36 min with SpO2 < 88%. ONO with 3 liters 05/03/12 >> Test time 7 hrs 57 min.  Mean SpO2 93.2%, low SpO2 65%.  Spent 1 hr 18 min with SpO2 < 88%. PSG 06/02/12 >> AHI 118.3, SpO2 low 67%.  CPAP 11 cm H2O >> better, but difficulty using mask.  Did not need supplemental oxygen with CPAP. ONO with CPAP and RA 06/21/12 >> Test time 9 hrs 30 min.  Basal SpO2 92.9%, low SpO2 66%.  Spent 17.7 min with SpO2 < 88%. Auto-CPAP 06/10/12 to 07/09/12 >> Used on 28 of 30 nights with average 7 hrs 56 min.  Average AHI 3.6 with median CPAP 10 cm H2O and 95 th percentile CPAP 13 cm H2O. ONO with CPAP and 1 liter 07/18/12 >> Test time 7 hrs 44 min.  Basal SpO2 96%, SpO2 low 81%.  Spent 0.7% of time with SpO2 < 88%.  PMHx, PSHx, Medications, Allergies, FHx, SHx reviewed.   Physical Exam:  General - No distress, Down's body habitus ENT - No sinus tenderness, no oral exudate, 2+ tonsils, no LAN, enlarged tongue,  scar over anterior neck, wears glasses Cardiac - s1s2 regular, no murmur Chest - No wheeze/rales/dullness, good air entry, normal respiratory excursion Back - No focal tenderness Abd - Soft, non-tender Ext - No edema Neuro - Normal strength Skin - No rashes Psych - Normal mood, and behavior   Assessment/Plan:  [name], MD [facility] Pager:  [phone] 09/17/2013, 2:33 PM

## 2013-09-17 NOTE — Assessment & Plan Note (Signed)
Continue oxygen with CPAP at night.

## 2013-09-17 NOTE — Assessment & Plan Note (Signed)
I am concerned her nocturnal cough might be related to her sleep apnea, and needing adjustment to her pressure setting.  Will get her download and make adjustment accordingly.  She may need in lab study.

## 2013-09-17 NOTE — Patient Instructions (Signed)
Will get CPAP report and call with results Follow up in 6 months 

## 2013-10-02 ENCOUNTER — Telehealth: Payer: Self-pay | Admitting: *Deleted

## 2013-10-02 NOTE — Telephone Encounter (Signed)
Calling patient to r/s appointment from 10/14/13 with NP CM, no answer left message to return the call to r/s appointment.

## 2013-10-14 ENCOUNTER — Ambulatory Visit: Payer: Self-pay | Admitting: Nurse Practitioner

## 2013-10-16 ENCOUNTER — Ambulatory Visit: Payer: Medicare Other | Admitting: Nurse Practitioner

## 2013-10-16 ENCOUNTER — Telehealth: Payer: Self-pay | Admitting: Nurse Practitioner

## 2013-10-16 NOTE — Telephone Encounter (Signed)
No-showed unscheduled appointment

## 2013-10-24 ENCOUNTER — Other Ambulatory Visit: Payer: Self-pay | Admitting: *Deleted

## 2013-10-24 MED ORDER — LEVETIRACETAM 500 MG PO TABS: ORAL_TABLET | ORAL | Status: DC

## 2013-11-06 ENCOUNTER — Other Ambulatory Visit: Payer: Self-pay | Admitting: Family Medicine

## 2013-11-06 ENCOUNTER — Other Ambulatory Visit: Payer: Self-pay | Admitting: Neurology

## 2013-11-25 ENCOUNTER — Encounter: Payer: Self-pay | Admitting: *Deleted

## 2013-11-26 ENCOUNTER — Ambulatory Visit: Payer: Medicare Other | Admitting: Nurse Practitioner

## 2013-12-02 ENCOUNTER — Other Ambulatory Visit: Payer: Self-pay | Admitting: Family Medicine

## 2013-12-04 ENCOUNTER — Ambulatory Visit (INDEPENDENT_AMBULATORY_CARE_PROVIDER_SITE_OTHER): Payer: Medicare Other

## 2013-12-04 DIAGNOSIS — Z23 Encounter for immunization: Secondary | ICD-10-CM

## 2013-12-08 ENCOUNTER — Other Ambulatory Visit: Payer: Self-pay | Admitting: Neurology

## 2013-12-08 ENCOUNTER — Other Ambulatory Visit: Payer: Self-pay | Admitting: Family Medicine

## 2013-12-08 NOTE — Telephone Encounter (Signed)
Patient has not been seen in over 1 year; Has not been in for last 4 scheduled appts

## 2013-12-10 ENCOUNTER — Ambulatory Visit (INDEPENDENT_AMBULATORY_CARE_PROVIDER_SITE_OTHER): Payer: Medicare Other | Admitting: Nurse Practitioner

## 2013-12-10 ENCOUNTER — Encounter (INDEPENDENT_AMBULATORY_CARE_PROVIDER_SITE_OTHER): Payer: Self-pay

## 2013-12-10 ENCOUNTER — Encounter: Payer: Self-pay | Admitting: Nurse Practitioner

## 2013-12-10 VITALS — BP 91/58 | HR 60 | Wt 232.0 lb

## 2013-12-10 DIAGNOSIS — R569 Unspecified convulsions: Secondary | ICD-10-CM

## 2013-12-10 DIAGNOSIS — Q909 Down syndrome, unspecified: Secondary | ICD-10-CM

## 2013-12-10 MED ORDER — LEVETIRACETAM 500 MG PO TABS: ORAL_TABLET | ORAL | Status: DC

## 2013-12-10 MED ORDER — MEMANTINE HCL 10 MG PO TABS: 10.0000 mg | ORAL_TABLET | Freq: Two times a day (BID) | ORAL | Status: DC

## 2013-12-10 NOTE — Progress Notes (Signed)
[facility]  PATIENT: [name] DOB: Sep 10, 1970   REASON FOR VISIT: for seizure disorder     HISTORY OF PRESENT ILLNESS:[name], 43 year old female returns for followup with her mother. She has a history of Downs syndrome and  cognitive decline over several years as well as seizure disorder. She is currently well controlled on Keppra and she is on Namenda without side effects to either drug. She is overweight, gets little exercise, lives at home. She has no new medical issues. She has no new complaints . She occasionally has behavior problems. She needs refills on her medications  HISTORY: [name] is a [age] year year old Caucasian lady with lifelong history of Down`s syndrome and cognitive decline in last several years from dementia who is referred back to see need for a new problem of seizures. [facility] with empyema and ventilator dependent respiratory failure. She underwent video assisted thoracotomy for empyma drainage. She required prolonged antibiotics and was eventually required tracheostomy. She had 2 brief episodes of what sound like complex partial seizures one week apart in September 2013. Her mother who is present today was eyewitness to both episodes. She was described as staring and unresponsive not following commands with a distant look on her face lasting a few minutes followed by a period of disorientation and tiredness. In the second episode she had some clonic activity in both her upper extremities. She was started on Keppra 500 twice a day falling and abnormal EEG on 11/04/2011 and with showed sharp waves in the left occipital parietal region. She was initially off her Namenda and Prozac in the hospital stay and had some behavioral agitation but settled down after the medicines were restarted. She has done well since discharge and has not had any more seizures.  She seems to be tolerating Keppra quite well without any side effects. She is finished a course of antibiotics and home physical and occupation therapy. She is back living at home with mom and plans to start attending adult daycare soon. She apparently did not have any brain imaging study done.  03/11/12: Ms [name] returns for followup. She was last seen by Dr. Pearlean BrownieSethi 12/11/11: She had prolonged hospitilization in Sept 2013 and had seizure events. She was started on Keppra at that time. No further seizure events. MRI of the brain 12/18/11 was normal. EEG was abnormal suggestive of mild focal irritability in right frontal and temporal regions but no definite epileptiform  activity is noted. ROS neg.      REVIEW OF SYSTEMS: Full 14 system review of systems performed and notable only for those listed, all others are neg:  Constitutional: N/A  Cardiovascular: N/A  Ear/Nose/Throat: N/A  Skin: N/A  Eyes: N/A  Respiratory: N/A  Gastroitestinal: N/A  Hematology/Lymphatic: N/A  Endocrine: N/A Musculoskeletal:N/A  Allergy/Immunology: N/A  Neurological: N/A Psychiatric: Behavior problems at time Sleep : NA   ALLERGIES: No Known Allergies  HOME MEDICATIONS: Outpatient Prescriptions Prior to Visit  Medication Sig Dispense Refill  . albuterol (PROVENTIL) (2.5 MG/3ML) 0.083% nebulizer solution Take 3 mLs (2.5 mg total) by nebulization every 6 (six) hours as needed for wheezing or shortness of breath.  75 mL  0  . clobetasol cream (TEMOVATE) 0.05 % APPLY 1 APPLICATION TOPICALLY TO AFFECTED AREA TWO TIMES A DAY  60 g  0  . levETIRAcetam (KEPPRA) 500 MG tablet TAKE 1 TABLET (500 MG TOTAL) BY MOUTH EVERY  12 (TWELVE) HOURS.  180 tablet  1  . levothyroxine (SYNTHROID, LEVOTHROID) 125 MCG tablet TAKE 1 TABLET BY MOUTH DAILY BEFORE BREAKFAST.  90 tablet  1  . memantine (NAMENDA) 10 MG tablet TAKE 1 TABLET BY MOUTH TWICE A DAY.  60 tablet  0  . Multiple Vitamins-Minerals (WOMENS MULTI PO) Take by mouth  daily.      . pantoprazole (PROTONIX) 40 MG tablet TAKE 1 TABLET BY MOUTH DAILY.  30 tablet  8  . FLUoxetine (PROZAC) 20 MG capsule       . prednisoLONE acetate (PRED FORTE) 1 % ophthalmic suspension       . triamcinolone cream (KENALOG) 0.1 % Apply 1 application topically 2 (two) times daily. Use on hand sparingly as can cause bleaching and atrophy.  30 g  0   No facility-administered medications prior to visit.    PAST MEDICAL HISTORY: Past Medical History  Diagnosis Date  . Down syndrome   . Pneumonia     multiple pneumonias  . Empyema of left pleural space 09/24/2011  . DJD (degenerative joint disease) of knee     bilat knee arthritis, had 3/3 Supartz injections [name](Kramer)  . Depression     with OCD  . History of diabetes mellitus     during TPN  . Hypothyroid   . Seizure     x 2 during hospitalization  . Dementia due to another medical condition     Trisomy 21-related, sees Pearlean BrownieSethi yearly, nl CT head 2010, did not tolerate aricept  . OSA on CPAP 05/2012    severe OSA with AHI 118, desat nadir to 67%  . Sleep-related hypoventilation 09/23/2012  . Eczema   . 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 Presenile dementia, uncomplicated     PAST SURGICAL HISTORY: Past Surgical History  Procedure Laterality Date  . Back surgery      ruptured disc  . Anterior cruciate ligament repair      left  . Flexible bronchoscopy  09/24/2011    Procedure: FLEXIBLE BRONCHOSCOPY;  Surgeon: Purcell Nailslarence H Owen, MD;  [facility];  Service: Thoracic;  Laterality: N/A;  . Tracheostomy tube placement  10/11/2011    Procedure: TRACHEOSTOMY;  Surgeon: Serena ColonelJefry Rosen, MD;  [facility];  Service: ENT;  Laterality: N/A;  . Left vats      (Dr. Barry Dieneswens)    FAMILY HISTORY: Family History  Problem Relation Age of Onset  . Arthritis Mother   . Hypertension Father   . Diabetes Father   . Cancer Neg Hx   . Coronary artery disease Neg Hx   . Stroke Neg Hx     SOCIAL HISTORY: History   Social History  . Marital Status:  Single    Spouse Name: N/A    Number of Children: 0  . Years of Education: N/A   Occupational History  . Not on file.   Social History Main Topics  . Smoking status: Never Smoker   . Smokeless tobacco: Never Used  . Alcohol Use: No  . Drug Use: No  . Sexual Activity: No   Other Topics Concern  . Not on file   Social History Narrative   Lives with mom and dad, dogs   Activity:    Diet: fruits/vegetables daily     PHYSICAL EXAM  Filed Vitals:   12/10/13 0846  BP: 91/58  Pulse: 60  Weight: 232 lb (105.235 kg)   Body mass index is 48.5 kg/(m^2). Generalized: Well developed, obese female in  no acute distress with downs features  Head: normocephalic and atraumatic,. Oropharynx benign  Neck: Supple, no carotid bruits  Cardiac: Regular rate rhythm, no murmur  Neurological examination  Mentation: Alert . Follows all commands speech and language fluent  Cranial nerve II-XII: Pupils were equal round reactive to light extraocular movements with end gaze horizontal nystagmus , visual field were full on confrontational test. Facial sensation and strength were normal. hearing was intact to finger rubbing bilaterally. Uvula tongue midline. head turning and shoulder shrug and were normal and symmetric.Tongue protrusion into cheek strength was normal.  Motor: normal bulk and tone, full strength in the BUE, BLE, fine finger movements normal, no pronator drift. No focal weakness  Sensory: normal and symmetric to light touch, pinprick, and vibration  Coordination: finger-nose-finger, heel-to-shin bilaterally, no dysmetria  Reflexes: Brachioradialis 2/2, biceps 2/2, triceps 2/2, patellar 2/2, Achilles 2/2, plantar responses were flexor bilaterally.  Gait and Station: Rising up from seated position without assistance, wide base stance, moderate stride, good arm swing, smooth turning, able to perform tiptoe, and heel walking without difficulty. Unsteady with tandem   DIAGNOSTIC DATA (LABS,  IMAGING, TESTING) - I reviewed patient records, labs, notes, testing and imaging myself where available.  Lab Results  Component Value Date   WBC 5.4 06/18/2013   HGB 13.9 06/18/2013   HCT 41.5 06/18/2013   MCV 101.7* 06/18/2013   PLT 224.0 06/18/2013      Component Value Date/Time   NA 137 02/06/2013 0812   K 4.3 02/06/2013 0812   CL 103 02/06/2013 0812   CO2 29 02/06/2013 0812   GLUCOSE 103* 02/06/2013 0812   BUN 16 02/06/2013 0812   CREATININE 1.2 02/06/2013 0812   CALCIUM 8.6 02/06/2013 0812   PROT 7.3 02/06/2013 0812   ALBUMIN 3.5 02/06/2013 0812   AST 17 02/06/2013 0812   ALT 21 02/06/2013 0812   ALKPHOS 45 02/06/2013 0812   BILITOT 0.4 02/06/2013 0812   GFRNONAA 64* 11/02/2011 0440   GFRAA 75* 11/02/2011 0440   Lab Results  Component Value Date   CHOL 179 02/06/2013   HDL 53.90 02/06/2013   LDLCALC 106* 02/06/2013   TRIG 97.0 02/06/2013   CHOLHDL 3 02/06/2013   Lab Results  Component Value Date   HGBA1C 5.6 02/06/2013    Lab Results  Component Value Date   TSH 4.78 02/06/2013      ASSESSMENT AND PLAN  43 y.o. year old female  has a past medical history of Downs syndrome; Depression;  Seizure; OSA on CPAP (05/2012);  and Presenile dementia, uncomplicated. here to followup. She has not had seizure activity in the last year.  Continue Keppra at current dose will refill Continue Namenda at current dose will refill Labs are performed by  primary care. Vst time 26 min next with Dr. Jarvis Newcomer, [facility], [facility], APRN  [facility] [address], [address] [place], [place] [address] [phone]

## 2013-12-10 NOTE — Patient Instructions (Signed)
Continue Keppra at current dose will refill Continue Namenda at current dose will refill Labs are performed by  primary care

## 2013-12-11 NOTE — Progress Notes (Signed)
I agree with the above plan 

## 2014-01-13 ENCOUNTER — Telehealth: Payer: Self-pay | Admitting: Family Medicine

## 2014-01-13 NOTE — Telephone Encounter (Signed)
Called and spoke with mom. Does not need ER evaluation for this. Pt with MR - sounds like she just got tired from dancing and needed to sit down and rest. Discussed could schedule appt for OV if needed, otherwise would just monitor for now. Pt at baseline, denies unilateral weakness, slurred speech or increased confusion.

## 2014-01-13 NOTE — Telephone Encounter (Signed)
Patient Information:  Caller Name: Lurena JoinerRebecca  Phone: [phone]  Patient: Caprice KluverMoore, Dominik  Gender: Female  DOB: 1970-09-29  Age: 43 Years  PCP: Eustaquio BoydenGutierrez, Javier [place]([facility])  Pregnant: No  Office Follow Up:  Does the office need to follow up with this patient?: No  Instructions For The Office: N/A   Symptoms  Reason For Call & Symptoms: Mom calling and states patient was at adult daycare 11-17 and was dancing. She began to feel weak and  told staff she could not feel her legs. She could not walk alone. Staff assisted her to sit. She sat for a "few minutes" and then she started laughing and was able to walk. Is walking as usual at time of call.  Reviewed Health History In EMR: Yes  Reviewed Medications In EMR: Yes  Reviewed Allergies In EMR: Yes  Reviewed Surgeries / Procedures: Yes  Date of Onset of Symptoms: 01/13/2014 OB / GYN:  LMP: 01/12/2014  Guideline(s) Used:  Neurologic Deficit  Disposition Per Guideline:   Go to Office Now  Reason For Disposition Reached:   Neurologic deficit that was transient (now gone), ANY of the following:   Weakness of the face, arm, or leg on one side of the body  Numbness of the face, arm, or leg on one side of the body  Loss of speech or garbled speech  Advice Given:  N/A  RN Overrode Recommendation:  Go To ED  Unable to schedule late appointment.

## 2014-01-24 ENCOUNTER — Emergency Department (HOSPITAL_COMMUNITY)
Admission: EM | Admit: 2014-01-24 | Discharge: 2014-01-24 | Disposition: A | Discharge status: Home / Self Care | Payer: Medicare Other | Attending: Emergency Medicine | Admitting: Emergency Medicine

## 2014-01-24 ENCOUNTER — Encounter (HOSPITAL_COMMUNITY): Payer: Self-pay | Admitting: Emergency Medicine

## 2014-01-24 DIAGNOSIS — G40909 Epilepsy, unspecified, not intractable, without status epilepticus: Secondary | ICD-10-CM | POA: Insufficient documentation

## 2014-01-24 DIAGNOSIS — E039 Hypothyroidism, unspecified: Secondary | ICD-10-CM | POA: Diagnosis not present

## 2014-01-24 DIAGNOSIS — J869 Pyothorax without fistula: Secondary | ICD-10-CM | POA: Diagnosis not present

## 2014-01-24 DIAGNOSIS — Z79899 Other long term (current) drug therapy: Secondary | ICD-10-CM | POA: Insufficient documentation

## 2014-01-24 DIAGNOSIS — Q909 Down syndrome, unspecified: Secondary | ICD-10-CM | POA: Insufficient documentation

## 2014-01-24 DIAGNOSIS — Z8659 Personal history of other mental and behavioral disorders: Secondary | ICD-10-CM | POA: Insufficient documentation

## 2014-01-24 DIAGNOSIS — G4733 Obstructive sleep apnea (adult) (pediatric): Secondary | ICD-10-CM | POA: Diagnosis not present

## 2014-01-24 DIAGNOSIS — Z8739 Personal history of other diseases of the musculoskeletal system and connective tissue: Secondary | ICD-10-CM | POA: Diagnosis not present

## 2014-01-24 DIAGNOSIS — Z9981 Dependence on supplemental oxygen: Secondary | ICD-10-CM | POA: Diagnosis not present

## 2014-01-24 DIAGNOSIS — Z8701 Personal history of pneumonia (recurrent): Secondary | ICD-10-CM | POA: Diagnosis not present

## 2014-01-24 DIAGNOSIS — H578 Other specified disorders of eye and adnexa: Secondary | ICD-10-CM | POA: Diagnosis present

## 2014-01-24 DIAGNOSIS — Z872 Personal history of diseases of the skin and subcutaneous tissue: Secondary | ICD-10-CM | POA: Diagnosis not present

## 2014-01-24 DIAGNOSIS — F0280 Dementia in other diseases classified elsewhere without behavioral disturbance: Secondary | ICD-10-CM | POA: Insufficient documentation

## 2014-01-24 DIAGNOSIS — H109 Unspecified conjunctivitis: Secondary | ICD-10-CM | POA: Insufficient documentation

## 2014-01-24 MED ORDER — TOBRAMYCIN 0.3 % OP SOLN: 2.0000 [drp] | Freq: Four times a day (QID) | OPHTHALMIC | Status: AC

## 2014-01-24 MED ORDER — FLUORESCEIN SODIUM 1 MG OP STRP
1.0000 | ORAL_STRIP | Freq: Once | OPHTHALMIC | Status: AC
  Administered 2014-01-24: 1 via OPHTHALMIC
  Filled 2014-01-24: qty 1

## 2014-01-24 MED ORDER — TETRACAINE HCL 0.5 % OP SOLN
2.0000 [drp] | Freq: Once | OPHTHALMIC | Status: AC
  Administered 2014-01-24: 2 [drp] via OPHTHALMIC
  Filled 2014-01-24: qty 2

## 2014-01-24 NOTE — ED Notes (Signed)
Complains of burning.

## 2014-01-24 NOTE — ED Notes (Signed)
After visual acuity test, right eye is 20/200 and left eye is and for the right eye is 20/200, both are the same

## 2014-01-24 NOTE — ED Notes (Signed)
Pt w/ down syndrome presenting w/ rt sided eye redness and burning.  Pt is not a very good historian but states this has been going on a couple of weeks.

## 2014-01-24 NOTE — Discharge Instructions (Signed)

## 2014-01-24 NOTE — ED Provider Notes (Signed)
CSN: [id number]     Arrival date & time 01/24/14  1856 History  This chart was scribed for non-physician practitioner working with Christine MawKristen N Ward, DO by Elveria Risingimelie Horne, ED Scribe. This patient was seen in room WTR7/WTR7 and the patient's care was started at 9:26 PM.   Chief Complaint  Patient presents with  . Eye Problem   The history is provided by the patient and a parent. No language interpreter was used.   HPI Comments: [name] is a 43 y.o. female with PMHx of [name], Diabetes Mellitus, DJD, and Hypothyroidism who presents to the Emergency Department complaining of right eye redness, onset this morning. Mother reports onset of redness upon wakening this morning. Patient reports associated pain; she denies similar symptoms in her left eye. Patient denies associated symptoms including ear pain, rhinorrhea, headache, or visual disturbance. Mother reports history of iritis and glaucoma like symptoms including increased pressure. Mother is uncertain of measures of her typical pressures. Patient's "retinal doctor" is Dr. Molli HazardMatthew.    Past Medical History  Diagnosis Date  . Down syndrome   . Pneumonia     multiple pneumonias  . Empyema of left pleural space 09/24/2011  . DJD (degenerative joint disease) of knee     bilat knee arthritis, had 3/3 Supartz injections [name](Kramer)  . Depression     with OCD  . History of diabetes mellitus     during TPN  . Hypothyroid   . Seizure     x 2 during hospitalization  . Dementia due to another medical condition     Trisomy 21-related, sees Pearlean BrownieSethi yearly, nl CT head 2010, did not tolerate aricept  . OSA on CPAP 05/2012    severe OSA with AHI 118, desat nadir to 67%  . Sleep-related hypoventilation 09/23/2012  . Eczema   . 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 Presenile dementia, uncomplicated    Past Surgical History  Procedure Laterality Date  . Back surgery      ruptured disc  . Anterior cruciate ligament repair      left  . Flexible bronchoscopy   09/24/2011    Procedure: FLEXIBLE BRONCHOSCOPY;  Surgeon: Purcell Nailslarence H Owen, MD;  [facility];  Service: Thoracic;  Laterality: N/A;  . Tracheostomy tube placement  10/11/2011    Procedure: TRACHEOSTOMY;  Surgeon: Serena ColonelJefry Rosen, MD;  [facility];  Service: ENT;  Laterality: N/A;  . Left vats      (Dr. Barry Dieneswens)   Family History  Problem Relation Age of Onset  . Arthritis Mother   . Hypertension Father   . Diabetes Father   . Cancer Neg Hx   . Coronary artery disease Neg Hx   . Stroke Neg Hx    History  Substance Use Topics  . Smoking status: Never Smoker   . Smokeless tobacco: Never Used  . Alcohol Use: No   OB History    No data available     Review of Systems  Constitutional: Negative for fever and chills.  HENT: Negative for ear pain.   Eyes: Positive for pain and redness. Negative for visual disturbance.  Neurological: Negative for headaches.    Allergies  Review of patient's allergies indicates no known allergies.  Home Medications   Prior to Admission medications   Medication Sig Start Date End Date Taking? Authorizing Provider  albuterol (PROVENTIL) (2.5 MG/3ML) 0.083% nebulizer solution Take 3 mLs (2.5 mg total) by nebulization every 6 (six) hours as needed for wheezing or shortness of  breath. 04/17/13   Eustaquio BoydenJavier Gutierrez, MD  clobetasol cream (TEMOVATE) 0.05 % APPLY 1 APPLICATION TOPICALLY TO AFFECTED AREA TWO TIMES A DAY 12/03/13   Eustaquio BoydenJavier Gutierrez, MD  levETIRAcetam (KEPPRA) 500 MG tablet TAKE 1 TABLET (500 MG TOTAL) BY MOUTH EVERY 12 (TWELVE) HOURS. 12/10/13   Nilda RiggsNancy Carolyn Martin, NP  levothyroxine (SYNTHROID, LEVOTHROID) 125 MCG tablet TAKE 1 TABLET BY MOUTH DAILY BEFORE BREAKFAST. 11/06/13   Eustaquio BoydenJavier Gutierrez, MD  memantine (NAMENDA) 10 MG tablet Take 1 tablet (10 mg total) by mouth 2 (two) times daily. 12/10/13   Nilda RiggsNancy Carolyn Martin, NP  Multiple Vitamins-Minerals (WOMENS MULTI PO) Take by mouth daily.    Historical Provider, MD  pantoprazole (PROTONIX) 40 MG  tablet TAKE 1 TABLET BY MOUTH DAILY. 12/08/13   Eustaquio BoydenJavier Gutierrez, MD   Triage Vitals: BP 118/76 mmHg  Pulse 87  Temp(Src) 98.6 F (37 C) (Oral)  Resp 18  SpO2 100% Physical Exam  Constitutional: She is oriented to person, place, and time. She appears well-developed and well-nourished. No distress.  HENT:  Head: Normocephalic and atraumatic.  Eyes: EOM are normal.  Right eye: significantly injected with erythematous conjunctiva, no chemosis, cornea is clear, no foreign bodies observed, no purulent discharge, full range of extraocular movement. Intraocular pressure in right eye, 21 and 24.   Neck: Neck supple. No tracheal deviation present.  Cardiovascular: Normal rate.   Pulmonary/Chest: Effort normal. No respiratory distress.  Musculoskeletal: Normal range of motion.  Neurological: She is alert and oriented to person, place, and time.  Skin: Skin is warm and dry.  Psychiatric: She has a normal mood and affect. Her behavior is normal.  Nursing note and vitals reviewed.   ED Course  Procedures (including critical care time)  COORDINATION OF CARE: 9:26 PM- Discussed treatment plan with patient at bedside and patient agreed to plan.   Labs Review Labs Reviewed - No data to display  Imaging Review No results found.   EKG Interpretation None      MDM   Final diagnoses:  None  1. Conjunctivitis  Will provide antibiotic drops for the right eye. [facility] and will check with them in the morning for appropriate follow up appointment. Pressures in the ED upper end of normal - not felt the cause of the eye redness tonight.    I personally performed the services described in this documentation, which was scribed in my presence. The recorded information has been reviewed and is accurate.    Arnoldo HookerShari A Rosali Augello, PA-C 01/28/14 2332  Christine MawKristen N Ward, DO 01/29/14 0111

## 2014-03-16 ENCOUNTER — Ambulatory Visit (INDEPENDENT_AMBULATORY_CARE_PROVIDER_SITE_OTHER): Payer: 59 | Admitting: Nurse Practitioner

## 2014-03-16 ENCOUNTER — Encounter: Payer: Self-pay | Admitting: Nurse Practitioner

## 2014-03-16 ENCOUNTER — Other Ambulatory Visit (INDEPENDENT_AMBULATORY_CARE_PROVIDER_SITE_OTHER): Payer: 59

## 2014-03-16 VITALS — BP 110/60 | HR 64 | Temp 97.5°F | Ht <= 58 in | Wt 230.0 lb

## 2014-03-16 DIAGNOSIS — R1030 Lower abdominal pain, unspecified: Secondary | ICD-10-CM

## 2014-03-16 LAB — POCT URINALYSIS DIPSTICK
Bilirubin, UA: NEGATIVE
Blood, UA: POSITIVE
Glucose, UA: NEGATIVE
Ketones, UA: 0.5
Leukocytes, UA: NEGATIVE
NITRITE UA: NEGATIVE
PH UA: 6
Protein, UA: NEGATIVE
SPEC GRAV UA: 1.01
UROBILINOGEN UA: NEGATIVE

## 2014-03-16 LAB — URINALYSIS, MICROSCOPIC ONLY: WBC, UA: NONE SEEN (ref 0–?)

## 2014-03-16 NOTE — Progress Notes (Signed)
Pre visit review using our clinic review tool, if applicable. No additional management support is needed unless otherwise documented below in the visit note. 

## 2014-03-16 NOTE — Patient Instructions (Signed)
I will have urine tests back in 2 days.  My office will call with results and follow up.   In the meantime, please change diet to mostly liquids for 2 days: smoothies, soup, any beverages, jello, popsicles. Increase to soft foods for 2 days if pain is better: scrambled egg, steamed vegetables & fruits, then as tolerated.   Please follow up w/Dr Sharen HonesGutierrez next week.

## 2014-03-17 NOTE — Progress Notes (Signed)
Subjective:     [name] is a 44 y.o. female w/Downs syndrome presents for evaluation of abdominal pain. She is accompanied by her mother. History is provided by patient & mother. Mother states she received phonecall from worker at adult day center that pt was c/o abd pain. Pt did not mention pain all weekend to mother, but did to another family member-mother was unaware until this am. Mother did not notice abnormal behavior over weekend. This morning pt is complaining to mother about pain.  Onset was 2 days ago. Symptoms have been unchanged. The pain is described as aching, and is 3/10 in intensity. Pain is located in the suprapubic region without radiation.  Aggravating factors: none.  Alleviating factors: none. Associated symptoms: none. The patient denies anorexia, belching, constipation, diarrhea, dysuria, fever, flatus, nausea and vomiting.  The patient's history has been marked as reviewed and updated as appropriate.  Review of Systems Constitutional: negative for weight loss Ears, nose, mouth, throat, and face: negative for sore throat Respiratory: negative for cough Genitourinary:negative for dysuria and frequency     Objective:    BP 110/60 mmHg  Pulse 64  Temp(Src) 97.5 F (36.4 C) (Oral)  Ht 4\' 10"  (1.473 m)  Wt 230 lb (104.327 kg)  BMI 48.08 kg/m2  SpO2 97% General appearance: alert, cooperative, no distress and Downs facies Head: Normocephalic, without obvious abnormality, atraumatic Eyes: negative findings: lids and lashes normal, conjunctivae and sclerae normal and wearing glasses Lungs: clear to auscultation bilaterally Heart: regular rate and rhythm, S1, S2 normal, no murmur, click, rub or gallop Abdomen: normal findings: no masses palpable and no organomegaly and abnormal findings:  hypoactive bowel sounds, obese and diffusely tender-grimaces to palpation, points to suprapubic area & states "here"    Assessment:Plan   1. Suprapubic pain, unspecified  laterality DD: UTI, diverticulitis - POCT urinalysis dipstick-pos blood o/w nml - Urine culture - Urine Microscopic; Future  Liquid diet X 2dys If no UTI & pain persistent, consider imaging: abd US or CT See pt instructions F/u w/Dr Rudene ReGuttierrez 1 wk

## 2014-03-19 ENCOUNTER — Telehealth: Payer: Self-pay | Admitting: Nurse Practitioner

## 2014-03-19 NOTE — Telephone Encounter (Signed)
Patient's mom was given results. Mom stated that pt is feeling better.

## 2014-03-19 NOTE — Telephone Encounter (Signed)
pls call pt: Advise There was problem with getting urine culture back-test was not completed by lab. Microanalysis was normal. She should follow up w/Dr Morton StallGuttierez if still complaining.

## 2014-06-01 ENCOUNTER — Telehealth: Payer: Self-pay | Admitting: *Deleted

## 2014-06-01 DIAGNOSIS — IMO0002 Reserved for concepts with insufficient information to code with codable children: Secondary | ICD-10-CM

## 2014-06-01 DIAGNOSIS — Z9989 Dependence on other enabling machines and devices: Secondary | ICD-10-CM

## 2014-06-01 DIAGNOSIS — G4736 Sleep related hypoventilation in conditions classified elsewhere: Secondary | ICD-10-CM

## 2014-06-01 DIAGNOSIS — G4733 Obstructive sleep apnea (adult) (pediatric): Secondary | ICD-10-CM

## 2014-06-01 NOTE — Telephone Encounter (Signed)
Referral placed. plz notify pt.

## 2014-06-01 NOTE — Telephone Encounter (Signed)
Attempted to [facility] patient's [facility]-no answer and mailbox was full. Unable to leave message. Form [facility] and placed up front.

## 2014-06-01 NOTE — Telephone Encounter (Signed)
Patient now had [employer] effective 05/29/2014 and has an appointment scheduled with Dr. Craige CottaSood  06/04/2014.  Patient needs a referral for this appointment. Patient has oxygen and a Cpap machine. When call when this has been taken care of.

## 2014-06-04 ENCOUNTER — Ambulatory Visit (INDEPENDENT_AMBULATORY_CARE_PROVIDER_SITE_OTHER): Payer: Commercial Managed Care - HMO | Admitting: Pulmonary Disease

## 2014-06-04 ENCOUNTER — Encounter: Payer: Self-pay | Admitting: Pulmonary Disease

## 2014-06-04 VITALS — BP 102/60 | HR 84 | Temp 98.0°F | Ht <= 58 in | Wt 229.8 lb

## 2014-06-04 DIAGNOSIS — G4736 Sleep related hypoventilation in conditions classified elsewhere: Secondary | ICD-10-CM

## 2014-06-04 DIAGNOSIS — Z9989 Dependence on other enabling machines and devices: Principal | ICD-10-CM

## 2014-06-04 DIAGNOSIS — G4733 Obstructive sleep apnea (adult) (pediatric): Secondary | ICD-10-CM | POA: Diagnosis not present

## 2014-06-04 DIAGNOSIS — IMO0002 Reserved for concepts with insufficient information to code with codable children: Secondary | ICD-10-CM

## 2014-06-04 NOTE — Patient Instructions (Signed)
Follow up in 1 year.

## 2014-06-04 NOTE — Progress Notes (Signed)
  Chief Complaint  Patient presents with  . Follow-up    Wears CPAP nightly x 6-7 hours. Denies any problems with mask/pressure.     History of Present Illness: [name] is a 44 y.o. female with severe OSA.  She has prior hx of pneumonia, empyema, and respiratory failure requiring tracheostomy.  She is accompanied by her mother.  She has been doing well with CPAP.  She gets about 7 hrs sleep per night and uses CPAP for whole time.  She has been sneezing, getting crusting under her nose, and more rash on her hands.  Tests: Echo 10/09/11 >> EF 60 to 65% ONO with RA 01/31/12>>Test time 6 hrs 1 min.  Mean SpO2 82%, low SpO2 59%.  Spent 4 hrs 16 min with SpO2 < 88%. ONO with 2L 04/02/12 >> Test time 7 hrs 53 min.  Mean SpO2 92.4%, low SpO2 71%.  Spent 1 hr 36 min with SpO2 < 88%. ONO with 3 liters 05/03/12 >> Test time 7 hrs 57 min.  Mean SpO2 93.2%, low SpO2 65%.  Spent 1 hr 18 min with SpO2 < 88%. PSG 06/02/12 >> AHI 118.3, SpO2 low 67%.  CPAP 11 cm H2O >> better, but difficulty using mask.  Did not need supplemental oxygen with CPAP. ONO with CPAP and RA 06/21/12 >> Test time 9 hrs 30 min.  Basal SpO2 92.9%, low SpO2 66%.  Spent 17.7 min with SpO2 < 88%. Auto-CPAP 06/10/12 to 07/09/12 >> Used on 28 of 30 nights with average 7 hrs 56 min.  Average AHI 3.6 with median CPAP 10 cm H2O and 95 th percentile CPAP 13 cm H2O. ONO with CPAP and 1 liter 07/18/12 >> Test time 7 hrs 44 min.  Basal SpO2 96%, SpO2 low 81%.  Spent 0.7% of time with SpO2 < 88%.  PMHx >> Down syndrome, Depression, Hypothyroidism, Seizure, Eczema  PSHx, Medications, Allergies, FHx, SHx reviewed.  Physical Exam: Blood pressure 102/60, pulse 84, temperature 98 F (36.7 C), temperature source Oral, height 4\' 10"  (1.473 m), weight 229 lb 12.8 oz (104.237 kg), SpO2 96 %. Body mass index is 48.04 kg/(m^2).  General - No distress, Down's body habitus ENT - No sinus tenderness, no oral exudate, 2+ tonsils, no LAN, enlarged  tongue, scar over anterior neck, wears glasses Cardiac - s1s2 regular, no murmur Chest - No wheeze/rales/dullness, good air entry, normal respiratory excursion Back - No focal tenderness Abd - Soft, non-tender Ext - No edema Neuro - Normal strength Skin - dry, flaking around nares and on dorsum of hands Psych - Normal mood, and behavior   Assessment/Plan:  Obstructive sleep apnea. Plan: - continue auto CPAP  Obesity hypoventilation syndrome. Plan: - continue 1 liter oxygen at night with CPAP  Allergies. Plan: - advised her to d/w her neurologist about whether she can use OTC anti-histamines with hx of seizures   Coralyn HellingVineet Colena Ketterman, MD [place] [facility] Pager:  [phone] 06/04/2014, 3:49 PM

## 2014-06-08 ENCOUNTER — Other Ambulatory Visit: Payer: Self-pay | Admitting: Family Medicine

## 2014-06-08 DIAGNOSIS — R7303 Prediabetes: Secondary | ICD-10-CM

## 2014-06-08 DIAGNOSIS — E039 Hypothyroidism, unspecified: Secondary | ICD-10-CM

## 2014-06-08 DIAGNOSIS — E669 Obesity, unspecified: Secondary | ICD-10-CM

## 2014-06-09 ENCOUNTER — Other Ambulatory Visit (INDEPENDENT_AMBULATORY_CARE_PROVIDER_SITE_OTHER): Payer: Commercial Managed Care - HMO

## 2014-06-09 DIAGNOSIS — R7309 Other abnormal glucose: Secondary | ICD-10-CM | POA: Diagnosis not present

## 2014-06-09 DIAGNOSIS — R7303 Prediabetes: Secondary | ICD-10-CM

## 2014-06-09 DIAGNOSIS — E039 Hypothyroidism, unspecified: Secondary | ICD-10-CM

## 2014-06-09 DIAGNOSIS — E669 Obesity, unspecified: Secondary | ICD-10-CM | POA: Diagnosis not present

## 2014-06-09 LAB — COMPREHENSIVE METABOLIC PANEL
ALK PHOS: 52 U/L (ref 39–117)
ALT: 24 U/L (ref 0–35)
AST: 20 U/L (ref 0–37)
Albumin: 3.4 g/dL — ABNORMAL LOW (ref 3.5–5.2)
BILIRUBIN TOTAL: 0.3 mg/dL (ref 0.2–1.2)
BUN: 20 mg/dL (ref 6–23)
CO2: 29 mEq/L (ref 19–32)
CREATININE: 1.09 mg/dL (ref 0.40–1.20)
Calcium: 9.4 mg/dL (ref 8.4–10.5)
Chloride: 103 mEq/L (ref 96–112)
GFR: 58.03 mL/min — ABNORMAL LOW (ref >60.00)
Glucose, Bld: 130 mg/dL — ABNORMAL HIGH (ref 70–99)
Potassium: 4.5 mEq/L (ref 3.5–5.1)
Sodium: 137 mEq/L (ref 135–145)
Total Protein: 7.1 g/dL (ref 6.0–8.3)

## 2014-06-09 LAB — TSH: TSH: 0.98 u[IU]/mL (ref 0.35–4.50)

## 2014-06-09 LAB — CBC WITH DIFFERENTIAL/PLATELET
BASOS ABS: 0 10*3/uL (ref 0.0–0.1)
Basophils Relative: 1 % (ref 0.0–3.0)
Eosinophils Absolute: 0.1 10*3/uL (ref 0.0–0.7)
Eosinophils Relative: 2.4 % (ref 0.0–5.0)
HCT: 38.8 % (ref 36.0–46.0)
Hemoglobin: 12.9 g/dL (ref 12.0–15.0)
LYMPHS PCT: 21.3 % (ref 12.0–46.0)
Lymphs Abs: 1 10*3/uL (ref 0.7–4.0)
MCHC: 33.3 g/dL (ref 30.0–36.0)
MCV: 100.9 fl — ABNORMAL HIGH (ref 78.0–100.0)
MONOS PCT: 9.9 % (ref 3.0–12.0)
Monocytes Absolute: 0.4 10*3/uL (ref 0.1–1.0)
Neutro Abs: 3 10*3/uL (ref 1.4–7.7)
Neutrophils Relative %: 65.4 % (ref 43.0–77.0)
Platelets: 189 10*3/uL (ref 150.0–400.0)
RBC: 3.84 Mil/uL — AB (ref 3.87–5.11)
RDW: 15 % (ref 11.5–15.5)
WBC: 4.6 10*3/uL (ref 4.0–10.5)

## 2014-06-09 LAB — LIPID PANEL
CHOL/HDL RATIO: 3
CHOLESTEROL: 141 mg/dL (ref 0–200)
HDL: 47.2 mg/dL (ref >39.00)
LDL Cholesterol: 81 mg/dL (ref 0–99)
NonHDL: 93.8
TRIGLYCERIDES: 62 mg/dL (ref 0.0–149.0)
VLDL: 12.4 mg/dL (ref 0.0–40.0)

## 2014-06-09 LAB — HEMOGLOBIN A1C: Hgb A1c MFr Bld: 5.7 % (ref 4.6–6.5)

## 2014-06-16 ENCOUNTER — Telehealth: Payer: Self-pay | Admitting: Family Medicine

## 2014-06-16 ENCOUNTER — Encounter: Payer: 59 | Admitting: Family Medicine

## 2014-06-16 NOTE — Telephone Encounter (Signed)
Patient did not come in for their appointment today for cpe.  Please let me know if patient needs to be contacted immediately for follow up or no follow up needed. °

## 2014-06-16 NOTE — Telephone Encounter (Signed)
L/m for pt to call to r/s cpe medicare wellness

## 2014-06-16 NOTE — Telephone Encounter (Signed)
Yes plz contact to reschedule medicare wellness visit

## 2014-06-18 NOTE — Telephone Encounter (Signed)
Scheduled for 5/16.

## 2014-06-18 NOTE — Telephone Encounter (Signed)
L/m for pt to call office back. Need to r/s cpe

## 2014-07-03 ENCOUNTER — Other Ambulatory Visit: Payer: Self-pay | Admitting: Family Medicine

## 2014-07-13 ENCOUNTER — Ambulatory Visit (INDEPENDENT_AMBULATORY_CARE_PROVIDER_SITE_OTHER): Payer: Commercial Managed Care - HMO | Admitting: Family Medicine

## 2014-07-13 ENCOUNTER — Encounter: Payer: Self-pay | Admitting: Family Medicine

## 2014-07-13 ENCOUNTER — Telehealth: Payer: Self-pay | Admitting: *Deleted

## 2014-07-13 VITALS — BP 90/58 | HR 79 | Temp 98.0°F | Ht <= 58 in | Wt 225.8 lb

## 2014-07-13 DIAGNOSIS — Z0000 Encounter for general adult medical examination without abnormal findings: Secondary | ICD-10-CM | POA: Diagnosis not present

## 2014-07-13 DIAGNOSIS — G40209 Localization-related (focal) (partial) symptomatic epilepsy and epileptic syndromes with complex partial seizures, not intractable, without status epilepticus: Secondary | ICD-10-CM

## 2014-07-13 DIAGNOSIS — K130 Diseases of lips: Secondary | ICD-10-CM | POA: Insufficient documentation

## 2014-07-13 DIAGNOSIS — R7303 Prediabetes: Secondary | ICD-10-CM

## 2014-07-13 DIAGNOSIS — Z1239 Encounter for other screening for malignant neoplasm of breast: Secondary | ICD-10-CM

## 2014-07-13 DIAGNOSIS — E669 Obesity, unspecified: Secondary | ICD-10-CM

## 2014-07-13 DIAGNOSIS — Z9989 Dependence on other enabling machines and devices: Secondary | ICD-10-CM

## 2014-07-13 DIAGNOSIS — G4733 Obstructive sleep apnea (adult) (pediatric): Secondary | ICD-10-CM

## 2014-07-13 DIAGNOSIS — Q909 Down syndrome, unspecified: Secondary | ICD-10-CM

## 2014-07-13 DIAGNOSIS — F0390 Unspecified dementia without behavioral disturbance: Secondary | ICD-10-CM

## 2014-07-13 DIAGNOSIS — F32A Depression, unspecified: Secondary | ICD-10-CM

## 2014-07-13 DIAGNOSIS — H547 Unspecified visual loss: Secondary | ICD-10-CM

## 2014-07-13 DIAGNOSIS — F329 Major depressive disorder, single episode, unspecified: Secondary | ICD-10-CM

## 2014-07-13 DIAGNOSIS — E039 Hypothyroidism, unspecified: Secondary | ICD-10-CM

## 2014-07-13 MED ORDER — CHLORHEXIDINE DIGLUCONATE 20 % SOLN: Status: DC

## 2014-07-13 MED ORDER — CLINDAMYCIN PHOSPHATE 1 % EX SOLN: Freq: Two times a day (BID) | CUTANEOUS | Status: DC

## 2014-07-13 NOTE — Assessment & Plan Note (Signed)
Will discuss with neurology possible taper off keppra

## 2014-07-13 NOTE — Assessment & Plan Note (Signed)
Thyroid levels stable. Continue current dose.

## 2014-07-13 NOTE — Assessment & Plan Note (Deleted)
Will discuss with neurology possible taper off keppra 

## 2014-07-13 NOTE — Assessment & Plan Note (Signed)
Consider checking vitamins.

## 2014-07-13 NOTE — Assessment & Plan Note (Signed)
Continue to encourage increased activity and healthy diet.

## 2014-07-13 NOTE — Assessment & Plan Note (Signed)

## 2014-07-13 NOTE — Assessment & Plan Note (Signed)
Appreciate pulm care of patient.  

## 2014-07-13 NOTE — Assessment & Plan Note (Signed)
Doing well off prozac. Occasional hoarding and occasional irritability

## 2014-07-13 NOTE — Telephone Encounter (Signed)
Pharmacist at [facility] left a voicemail stating that she received a electronic prescription for Chlorhexidine and she can not locate the product. Please call back.

## 2014-07-13 NOTE — Assessment & Plan Note (Signed)
Preventative protocols reviewed and updated unless pt declined. Discussed healthy diet and lifestyle.  

## 2014-07-13 NOTE — Assessment & Plan Note (Signed)
Stable

## 2014-07-13 NOTE — Patient Instructions (Addendum)
Lets trial taper off protonix. Advanced directive packet provided today. Hearing screen today. Return as needed or in 1 year for next wellness visit. We will place referrals to lung doctor neurologist and eye doctor

## 2014-07-13 NOTE — Telephone Encounter (Signed)
Clarified. 20% not available.

## 2014-07-13 NOTE — Assessment & Plan Note (Signed)
Mom content with namenda effect. Denies memory loss sxs.

## 2014-07-13 NOTE — Progress Notes (Signed)
BP 90/58 mmHg  Pulse 79  Temp(Src) 98 F (36.7 C) (Oral)  Ht  (1.473 m)  Wt 225 lb 12.8 oz (102.422 kg)  BMI 47.20 kg/m2  SpO2 94%  LMP 06/15/2014   CC: medicare wellness visit  Subjective:    Patient ID: [name], female    DOB: 25-Nov-1970, 44 y.o.   MRN: [id number]  HPI: [name] is a 44 y.o. female presenting on 07/13/2014 for Medicare Wellness   Continues hoarding. Continued facial hair in chin.   Hearing - unable to check today - machine not working. Vision - trouble with both eyes. Sees Dr. Ashley Royalty and [facility]. ?glaucoma. Last seen them 01/2014. Q6 mo No falls in last year. Denies mood issues - depression/anhedonia/sadness No falls in last year.  Preventative: Regular periods LMP 06/15/2014 Well woman - discussed with mom. Would like mammogram scheduled but doesn't want pap smear or pelvic exam or breast exam. Flu shot - 11/2013 Pneumovax done 2013. Tetanus - 01/2013 Advanced directives: mom would be HCPOA but this has not been set up yet - mom will work on this.  Lives with mom and dad, dogs Stays at daycare during the day Activity: walking some Diet: good water, fruits/vegetables daily  Relevant past medical, surgical, family and social history reviewed and updated as indicated. Interim medical history since our last visit reviewed. Allergies and medications reviewed and updated. Current Outpatient Prescriptions on File Prior to Visit  Medication Sig  . albuterol (PROVENTIL) (2.5 MG/3ML) 0.083% nebulizer solution Take 3 mLs (2.5 mg total) by nebulization every 6 (six) hours as needed for wheezing or shortness of breath.  . clobetasol cream (TEMOVATE) 0.05 % APPLY TO AFFECTED AREA TWO TIMES A DAY.  . levETIRAcetam (KEPPRA) 500 MG tablet TAKE 1 TABLET (500 MG TOTAL) BY MOUTH EVERY 12 (TWELVE) HOURS.  Marland Kitchen levothyroxine (SYNTHROID, LEVOTHROID) 125 MCG tablet TAKE 1 TABLET BY MOUTH DAILY BEFORE BREAKFAST.  . memantine (NAMENDA) 10 MG tablet Take  1 tablet (10 mg total) by mouth 2 (two) times daily.  . Multiple Vitamins-Minerals (WOMENS MULTI PO) Take by mouth daily.  . pantoprazole (PROTONIX) 40 MG tablet TAKE 1 TABLET BY MOUTH DAILY.   No current facility-administered medications on file prior to visit.    Review of Systems  Constitutional: Negative for fever, chills, activity change, appetite change, fatigue and unexpected weight change.  HENT: Negative for hearing loss.   Eyes: Negative for visual disturbance.  Respiratory: Negative for cough, chest tightness, shortness of breath and wheezing.   Cardiovascular: Negative for chest pain, palpitations and leg swelling.  Gastrointestinal: Positive for diarrhea. Negative for nausea, vomiting, abdominal pain, constipation, blood in stool and abdominal distention.  Genitourinary: Negative for hematuria and difficulty urinating.  Musculoskeletal: Positive for back pain (lower). Negative for myalgias, arthralgias and neck pain.  Skin: Negative for rash.  Neurological: Negative for dizziness, seizures, syncope and headaches.  Hematological: Negative for adenopathy. Does not bruise/bleed easily.  Psychiatric/Behavioral: Positive for dysphoric mood (occasional mood swings). The patient is not nervous/anxious.    Per HPI unless specifically indicated above     Objective:    BP 90/58 mmHg  Pulse 79  Temp(Src) 98 F (36.7 C) (Oral)  Ht  (1.473 m)  Wt 225 lb 12.8 oz (102.422 kg)  BMI 47.20 kg/m2  SpO2 94%  LMP 06/15/2014  Wt Readings from Last 3 Encounters:  07/13/14 225 lb 12.8 oz (102.422 kg)  06/04/14 229 lb 12.8 oz (104.237 kg)  03/16/14  230 lb (104.327 kg)    Physical Exam  Constitutional: She is oriented to person, place, and time. She appears well-developed and well-nourished. No distress.  HENT:  Head: Normocephalic and atraumatic.  Right Ear: Hearing, tympanic membrane, external ear and ear canal normal.  Left Ear: Hearing, tympanic membrane, external ear and  ear canal normal.  Nose: Nose normal.  Mouth/Throat: Uvula is midline, oropharynx is clear and moist and mucous membranes are normal. No oropharyngeal exudate, posterior oropharyngeal edema or posterior oropharyngeal erythema.  Eyes: Conjunctivae and EOM are normal. Pupils are equal, round, and reactive to light. No scleral icterus.  Neck: Normal range of motion. Neck supple.  Cardiovascular: Normal rate, regular rhythm, normal heart sounds and intact distal pulses.   No murmur heard. Pulses:      Radial pulses are 2+ on the right side, and 2+ on the left side.  Pulmonary/Chest: Effort normal and breath sounds normal. No respiratory distress. She has no wheezes. She has no rales.  Abdominal: Soft. Bowel sounds are normal. She exhibits no distension and no mass. There is no tenderness. There is no rebound and no guarding.  Musculoskeletal: Normal range of motion. She exhibits no edema.  Lymphadenopathy:    She has no cervical adenopathy.  Neurological: She is alert and oriented to person, place, and time.  CN grossly intact, station and gait intact  Skin: Skin is warm and dry. No rash noted.  Psychiatric: She has a normal mood and affect. Her behavior is normal. Judgment and thought content normal.  Nursing note and vitals reviewed.  Results for orders placed or performed in visit on 06/09/14  Lipid panel  Result Value Ref Range   Cholesterol 141 0 - 200 mg/dL   Triglycerides 16.162.0 0.0 - 149.0 mg/dL   HDL 09.6047.20 >45.40>39.00 mg/dL   VLDL 98.112.4 0.0 - 19.140.0 mg/dL   LDL Cholesterol 81 0 - 99 mg/dL   Total CHOL/HDL Ratio 3    NonHDL 93.80   Comprehensive metabolic panel  Result Value Ref Range   Sodium 137 135 - 145 mEq/L   Potassium 4.5 3.5 - 5.1 mEq/L   Chloride 103 96 - 112 mEq/L   CO2 29 19 - 32 mEq/L   Glucose, Bld 130 (H) 70 - 99 mg/dL   BUN 20 6 - 23 mg/dL   Creatinine, Ser 4.781.09 0.40 - 1.20 mg/dL   Total Bilirubin 0.3 0.2 - 1.2 mg/dL   Alkaline Phosphatase 52 39 - 117 U/L   AST 20 0  - 37 U/L   ALT 24 0 - 35 U/L   Total Protein 7.1 6.0 - 8.3 g/dL   Albumin 3.4 (L) 3.5 - 5.2 g/dL   Calcium 9.4 8.4 - 29.510.5 mg/dL   GFR 62.1358.03 (L) >08.65>60.00 mL/min  TSH  Result Value Ref Range   TSH 0.98 0.35 - 4.50 uIU/mL  Hemoglobin A1c  Result Value Ref Range   Hgb A1c MFr Bld 5.7 4.6 - 6.5 %  CBC with Differential/Platelet  Result Value Ref Range   WBC 4.6 4.0 - 10.5 K/uL   RBC 3.84 (L) 3.87 - 5.11 Mil/uL   Hemoglobin 12.9 12.0 - 15.0 g/dL   HCT 78.438.8 69.636.0 - 29.546.0 %   MCV 100.9 (H) 78.0 - 100.0 fl   MCHC 33.3 30.0 - 36.0 g/dL   RDW 28.415.0 13.211.5 - 44.015.5 %   Platelets 189.0 150.0 - 400.0 K/uL   Neutrophils Relative % 65.4 43.0 - 77.0 %   Lymphocytes Relative 21.3 12.0 -  46.0 %   Monocytes Relative 9.9 3.0 - 12.0 %   Eosinophils Relative 2.4 0.0 - 5.0 %   Basophils Relative 1.0 0.0 - 3.0 %   Neutro Abs 3.0 1.4 - 7.7 K/uL   Lymphs Abs 1.0 0.7 - 4.0 K/uL   Monocytes Absolute 0.4 0.1 - 1.0 K/uL   Eosinophils Absolute 0.1 0.0 - 0.7 K/uL   Basophils Absolute 0.0 0.0 - 0.1 K/uL      Assessment & Plan:   Problem List Items Addressed This Visit    Depression    Doing well off prozac. Occasional hoarding and occasional irritability      Down syndrome    Stable. No change in functional status.      Relevant Orders   Ambulatory referral to Neurology   Health maintenance examination    Preventative protocols reviewed and updated unless pt declined. Discussed healthy diet and lifestyle.       Hypothyroid    Thyroid levels stable. Continue current dose.      Lip fissure    Consider checking vitamins.      Medicare annual wellness visit, subsequent - Primary    I have personally reviewed the Medicare Annual Wellness questionnaire and have noted 1. The patient's medical and social history 2. Their use of alcohol, tobacco or illicit drugs 3. Their current medications and supplements 4. The patient's functional ability including ADL's, fall risks, home safety risks and hearing or  visual impairment. 5. Diet and physical activity 6. Evidence for depression or mood disorders The patients weight, height, BMI have been recorded in the chart.  Hearing and vision has been addressed. I have made referrals, counseling and provided education to the patient based review of the above and I have provided the pt with a written personalized care plan for preventive services. Provider list updated - see scanned questionairre. Reviewed preventative protocols and updated unless pt declined.       Obesity    Continue to encourage increased activity and healthy diet.      OSA on CPAP    Appreciate pulm care of patient.      Relevant Orders   Ambulatory referral to Pulmonology   Partial epilepsy with impairment of consciousness    Will discuss with neurology possible taper off keppra      Prediabetes    Stable.      Presenile dementia, uncomplicated    Mom content with namenda effect. Denies memory loss sxs.      Relevant Orders   Ambulatory referral to Neurology    Other Visit Diagnoses    Screening for breast cancer        Relevant Orders    MM DIGITAL SCREENING BILATERAL    Vision impairment        Relevant Orders    Ambulatory referral to Ophthalmology        Follow up plan: Return in about 1 year (around 07/13/2015), or as needed, for medicare wellness visit.

## 2014-07-13 NOTE — Assessment & Plan Note (Signed)
Stable. No change in functional status.

## 2014-07-13 NOTE — Progress Notes (Signed)
Pre visit review using our clinic review tool, if applicable. No additional management support is needed unless otherwise documented below in the visit note. 

## 2014-07-21 ENCOUNTER — Other Ambulatory Visit: Payer: Self-pay | Admitting: Family Medicine

## 2014-08-04 ENCOUNTER — Telehealth: Payer: Self-pay | Admitting: Pulmonary Disease

## 2014-08-04 DIAGNOSIS — G4733 Obstructive sleep apnea (adult) (pediatric): Secondary | ICD-10-CM

## 2014-08-04 NOTE — Telephone Encounter (Signed)
ATC, NA and no option to leave a msg

## 2014-08-05 NOTE — Telephone Encounter (Signed)
Spoke with pt's mother, states new cpap order to a new DME company d/t insurance changes.  [name] isn't sure of what company is preferred for pt as she is not home currently, but will call us to verify info.  I relayed the [employer] home monitoring as listed below but pt's mother wanted to verify company before order was placed.  Will await call.

## 2014-08-07 NOTE — Telephone Encounter (Signed)
[name] called, they are going to be using [employer] for their DME.

## 2014-08-07 NOTE — Telephone Encounter (Signed)
Please send order for auto CPAP range 5 to 15 cm H2O with heated humidity and mask of choice.

## 2014-08-07 NOTE — Telephone Encounter (Signed)
Noted, and thanks [name].  Dr. Craige Cotta please advise on pressure settings for pt's new cpap.  Thanks!

## 2014-08-07 NOTE — Telephone Encounter (Signed)
Order sent for CPAP. Nothing further needed.

## 2014-09-24 ENCOUNTER — Telehealth: Payer: Self-pay | Admitting: *Deleted

## 2014-09-24 MED ORDER — MUPIROCIN 2 % EX OINT: 1.0000 "application " | TOPICAL_OINTMENT | Freq: Two times a day (BID) | CUTANEOUS | Status: DC

## 2014-09-24 NOTE — Telephone Encounter (Signed)
OK to refill

## 2014-09-24 NOTE — Telephone Encounter (Signed)
OK to refill Mupirocin 2% ointment in Dr. Timoteo Expose absence? Had been stopped in 2015, but now needs refill.

## 2014-09-24 NOTE — Telephone Encounter (Signed)
Sent in as previously written.

## 2014-09-25 ENCOUNTER — Emergency Department (HOSPITAL_COMMUNITY): Payer: Commercial Managed Care - HMO

## 2014-09-25 ENCOUNTER — Encounter (HOSPITAL_COMMUNITY): Payer: Self-pay | Admitting: Emergency Medicine

## 2014-09-25 ENCOUNTER — Telehealth: Payer: Self-pay | Admitting: Family Medicine

## 2014-09-25 ENCOUNTER — Inpatient Hospital Stay (HOSPITAL_COMMUNITY)
Admission: EM | Admit: 2014-09-25 | Discharge: 2014-09-27 | DRG: 312 | Disposition: A | Discharge status: Home / Self Care | Payer: Commercial Managed Care - HMO | Attending: Internal Medicine | Admitting: Internal Medicine

## 2014-09-25 DIAGNOSIS — F42 Obsessive-compulsive disorder: Secondary | ICD-10-CM | POA: Diagnosis present

## 2014-09-25 DIAGNOSIS — Z8673 Personal history of transient ischemic attack (TIA), and cerebral infarction without residual deficits: Secondary | ICD-10-CM | POA: Diagnosis not present

## 2014-09-25 DIAGNOSIS — Q909 Down syndrome, unspecified: Secondary | ICD-10-CM | POA: Diagnosis not present

## 2014-09-25 DIAGNOSIS — L02225 Furuncle of perineum: Secondary | ICD-10-CM | POA: Diagnosis present

## 2014-09-25 DIAGNOSIS — G40909 Epilepsy, unspecified, not intractable, without status epilepticus: Secondary | ICD-10-CM | POA: Diagnosis present

## 2014-09-25 DIAGNOSIS — F0390 Unspecified dementia without behavioral disturbance: Secondary | ICD-10-CM | POA: Diagnosis present

## 2014-09-25 DIAGNOSIS — Z8701 Personal history of pneumonia (recurrent): Secondary | ICD-10-CM | POA: Diagnosis not present

## 2014-09-25 DIAGNOSIS — I517 Cardiomegaly: Secondary | ICD-10-CM | POA: Diagnosis present

## 2014-09-25 DIAGNOSIS — G4733 Obstructive sleep apnea (adult) (pediatric): Secondary | ICD-10-CM | POA: Diagnosis present

## 2014-09-25 DIAGNOSIS — G40209 Localization-related (focal) (partial) symptomatic epilepsy and epileptic syndromes with complex partial seizures, not intractable, without status epilepticus: Secondary | ICD-10-CM

## 2014-09-25 DIAGNOSIS — F329 Major depressive disorder, single episode, unspecified: Secondary | ICD-10-CM | POA: Diagnosis present

## 2014-09-25 DIAGNOSIS — R001 Bradycardia, unspecified: Secondary | ICD-10-CM | POA: Diagnosis present

## 2014-09-25 DIAGNOSIS — F0280 Dementia in other diseases classified elsewhere without behavioral disturbance: Secondary | ICD-10-CM | POA: Diagnosis present

## 2014-09-25 DIAGNOSIS — E119 Type 2 diabetes mellitus without complications: Secondary | ICD-10-CM | POA: Diagnosis present

## 2014-09-25 DIAGNOSIS — R1030 Lower abdominal pain, unspecified: Secondary | ICD-10-CM | POA: Diagnosis not present

## 2014-09-25 DIAGNOSIS — R42 Dizziness and giddiness: Secondary | ICD-10-CM

## 2014-09-25 DIAGNOSIS — L0293 Carbuncle, unspecified: Secondary | ICD-10-CM | POA: Diagnosis not present

## 2014-09-25 DIAGNOSIS — Z9989 Dependence on other enabling machines and devices: Secondary | ICD-10-CM

## 2014-09-25 DIAGNOSIS — E039 Hypothyroidism, unspecified: Secondary | ICD-10-CM | POA: Diagnosis present

## 2014-09-25 DIAGNOSIS — I951 Orthostatic hypotension: Secondary | ICD-10-CM | POA: Diagnosis not present

## 2014-09-25 HISTORY — DX: Obsessive-compulsive disorder, unspecified: F42.9

## 2014-09-25 HISTORY — DX: Type 2 diabetes mellitus without complications: E11.9

## 2014-09-25 HISTORY — DX: Dependence on supplemental oxygen: Z99.81

## 2014-09-25 LAB — CBC
HEMATOCRIT: 42.8 % (ref 36.0–46.0)
HEMOGLOBIN: 14.2 g/dL (ref 12.0–15.0)
MCH: 33.7 pg (ref 26.0–34.0)
MCHC: 33.2 g/dL (ref 30.0–36.0)
MCV: 101.7 fL — ABNORMAL HIGH (ref 78.0–100.0)
Platelets: 223 10*3/uL (ref 150–400)
RBC: 4.21 MIL/uL (ref 3.87–5.11)
RDW: 14.3 % (ref 11.5–15.5)
WBC: 5.4 10*3/uL (ref 4.0–10.5)

## 2014-09-25 LAB — BASIC METABOLIC PANEL
Anion gap: 7 (ref 5–15)
BUN: 11 mg/dL (ref 6–20)
CHLORIDE: 102 mmol/L (ref 101–111)
CO2: 29 mmol/L (ref 22–32)
CREATININE: 1.24 mg/dL — AB (ref 0.44–1.00)
Calcium: 9.3 mg/dL (ref 8.9–10.3)
GFR calc Af Amer: >60 mL/min (ref >60)
GFR calc non Af Amer: 52 mL/min — ABNORMAL LOW (ref >60)
Glucose, Bld: 86 mg/dL (ref 65–99)
POTASSIUM: 4.2 mmol/L (ref 3.5–5.1)
SODIUM: 138 mmol/L (ref 135–145)

## 2014-09-25 LAB — URINALYSIS, ROUTINE W REFLEX MICROSCOPIC
Bilirubin Urine: NEGATIVE
GLUCOSE, UA: NEGATIVE mg/dL
HGB URINE DIPSTICK: NEGATIVE
KETONES UR: NEGATIVE mg/dL
Leukocytes, UA: NEGATIVE
NITRITE: NEGATIVE
PROTEIN: NEGATIVE mg/dL
Specific Gravity, Urine: 1.008 (ref 1.005–1.030)
Urobilinogen, UA: 0.2 mg/dL (ref 0.0–1.0)
pH: 6 (ref 5.0–8.0)

## 2014-09-25 LAB — CBG MONITORING, ED
GLUCOSE-CAPILLARY: 83 mg/dL (ref 65–99)
Glucose-Capillary: 84 mg/dL (ref 65–99)

## 2014-09-25 MED ORDER — SODIUM CHLORIDE 0.9 % IV BOLUS (SEPSIS)
1000.0000 mL | Freq: Once | INTRAVENOUS | Status: AC
  Administered 2014-09-25: 1000 mL via INTRAVENOUS

## 2014-09-25 MED ORDER — MUPIROCIN 2 % EX OINT
1.0000 "application " | TOPICAL_OINTMENT | Freq: Two times a day (BID) | CUTANEOUS | Status: DC
  Administered 2014-09-26: 1 via TOPICAL
  Filled 2014-09-25: qty 22

## 2014-09-25 MED ORDER — ACETAMINOPHEN 650 MG RE SUPP: 650.0000 mg | Freq: Four times a day (QID) | RECTAL | Status: DC | PRN

## 2014-09-25 MED ORDER — SODIUM CHLORIDE 0.9 % IJ SOLN
3.0000 mL | Freq: Two times a day (BID) | INTRAMUSCULAR | Status: DC
Start: 2014-09-25 — End: 2014-09-27
  Administered 2014-09-25 – 2014-09-26 (×2): 3 mL via INTRAVENOUS

## 2014-09-25 MED ORDER — ALUM & MAG HYDROXIDE-SIMETH 200-200-20 MG/5ML PO SUSP: 30.0000 mL | Freq: Four times a day (QID) | ORAL | Status: DC | PRN

## 2014-09-25 MED ORDER — OXYCODONE HCL 5 MG PO TABS: 5.0000 mg | ORAL_TABLET | ORAL | Status: DC | PRN

## 2014-09-25 MED ORDER — ENOXAPARIN SODIUM 40 MG/0.4ML ~~LOC~~ SOLN
40.0000 mg | SUBCUTANEOUS | Status: DC
  Administered 2014-09-26: 40 mg via SUBCUTANEOUS
  Filled 2014-09-25: qty 0.4

## 2014-09-25 MED ORDER — ONDANSETRON HCL 4 MG PO TABS: 4.0000 mg | ORAL_TABLET | Freq: Four times a day (QID) | ORAL | Status: DC | PRN

## 2014-09-25 MED ORDER — SODIUM CHLORIDE 0.9 % IV SOLN
INTRAVENOUS | Status: DC
  Administered 2014-09-25 – 2014-09-26 (×3): via INTRAVENOUS

## 2014-09-25 MED ORDER — HYDROMORPHONE HCL 1 MG/ML IJ SOLN: 0.5000 mg | INTRAMUSCULAR | Status: DC | PRN

## 2014-09-25 MED ORDER — SODIUM CHLORIDE 0.9 % IV BOLUS (SEPSIS): 1000.0000 mL | Freq: Once | INTRAVENOUS | Status: DC

## 2014-09-25 MED ORDER — LEVETIRACETAM 500 MG PO TABS
500.0000 mg | ORAL_TABLET | Freq: Two times a day (BID) | ORAL | Status: DC
  Administered 2014-09-26 – 2014-09-27 (×3): 500 mg via ORAL
  Filled 2014-09-25 (×4): qty 1

## 2014-09-25 MED ORDER — LEVOTHYROXINE SODIUM 125 MCG PO TABS
125.0000 ug | ORAL_TABLET | Freq: Every day | ORAL | Status: DC
  Administered 2014-09-26 – 2014-09-27 (×2): 125 ug via ORAL
  Filled 2014-09-25 (×2): qty 1

## 2014-09-25 MED ORDER — ADULT MULTIVITAMIN W/MINERALS CH
1.0000 | ORAL_TABLET | Freq: Every day | ORAL | Status: DC
  Administered 2014-09-26 – 2014-09-27 (×2): 1 via ORAL
  Filled 2014-09-25 (×2): qty 1

## 2014-09-25 MED ORDER — ALBUTEROL SULFATE (2.5 MG/3ML) 0.083% IN NEBU: 2.5000 mg | INHALATION_SOLUTION | Freq: Four times a day (QID) | RESPIRATORY_TRACT | Status: DC | PRN

## 2014-09-25 MED ORDER — CLOBETASOL PROPIONATE 0.05 % EX CREA
TOPICAL_CREAM | Freq: Two times a day (BID) | CUTANEOUS | Status: DC | PRN
  Filled 2014-09-25: qty 15

## 2014-09-25 MED ORDER — ACETAMINOPHEN 325 MG PO TABS: 650.0000 mg | ORAL_TABLET | Freq: Four times a day (QID) | ORAL | Status: DC | PRN

## 2014-09-25 MED ORDER — PANTOPRAZOLE SODIUM 40 MG PO TBEC
40.0000 mg | DELAYED_RELEASE_TABLET | Freq: Every day | ORAL | Status: DC
  Administered 2014-09-26 – 2014-09-27 (×2): 40 mg via ORAL
  Filled 2014-09-25 (×2): qty 1

## 2014-09-25 MED ORDER — ONDANSETRON HCL 4 MG/2ML IJ SOLN: 4.0000 mg | Freq: Four times a day (QID) | INTRAMUSCULAR | Status: DC | PRN

## 2014-09-25 MED ORDER — MEMANTINE HCL 10 MG PO TABS
10.0000 mg | ORAL_TABLET | Freq: Two times a day (BID) | ORAL | Status: DC
Start: 2014-09-25 — End: 2014-09-27
  Administered 2014-09-26 – 2014-09-27 (×3): 10 mg via ORAL
  Filled 2014-09-25 (×4): qty 1

## 2014-09-25 NOTE — ED Notes (Signed)
Pt placed in room and is getting dressed in gown. RN notified

## 2014-09-25 NOTE — Telephone Encounter (Signed)
PLEASE NOTE: All timestamps contained within this report are represented as [place] Standard Time. CONFIDENTIALTY NOTICE: This fax transmission is intended only for the addressee. It contains information that is legally privileged, confidential or otherwise protected from use or disclosure. If you are not the intended recipient, you are strictly prohibited from reviewing, disclosing, copying using or disseminating any of this information or taking any action in reliance on or regarding this information. If you have received this fax in error, please notify us immediately by telephone so that we can arrange for its return to [place]. Phone: [phone], Toll-Free: [phone], Fax: [phone] Page: 1 of 2 Call Id: [id number] [facility] Day - Client TELEPHONE ADVICE RECORD [facility] Call [facility] Patient Name: [name] Gender: Female DOB: [birth year] Age: 44 Y 13 D Return Phone Number: [phone] (Primary) Address: City/State/Zip: [place] Client [facility] Day - Client Client Site [facility] [place] - Day Physician Eustaquio Boyden Contact Type Call Call Type Triage / Clinical Caller Name [name] Relationship To Patient Mother Appointment Disposition EMR Appointment Not Necessary Info pasted into Epic Yes Return Phone Number [phone] (Primary) Chief Complaint Leg Pain Initial Comment Caller states 2 days ago, patient could not move legs after using bathroom, then fine. Just happened today. BP 140, high for her. PreDisposition Call Doctor Nurse Assessment Nurse: Elijah Birk, RN, Stark Bray Date/Time Lamount Cohen Time): 09/25/2014 1:20:17 PM Confirm and document reason for call. If symptomatic, describe symptoms. ---Caller states 2 days ago, patient could not move legs after using bathroom, then fine. Just happened today. BP 140 systolic, usually 110's systolic, high for her. She is at an adult day care center. Has the patient  traveled out of the country within the last 30 days? ---Not Applicable Does the patient require triage? ---Yes Related visit to physician within the last 2 weeks? ---No Does the PT have any chronic conditions? (i.e. diabetes, asthma, etc.) ---Yes List chronic conditions. ---[name], thyroid, seizure rx, lung issues Did the patient indicate they were pregnant? ---No Guidelines Guideline Title Affirmed Question Affirmed Notes Nurse Date/Time (Eastern Time) Neurologic Deficit Can't walk or can barely walk [place], Charity fundraiser, Stark Bray 09/25/2014 1:23:27 PM Disp. Time Lamount Cohen Time) Disposition Final User 09/25/2014 1:27:27 PM Go to ED Now Yes Elijah Birk, RN, Irene Shipper Understands: Yes PLEASE NOTE: All timestamps contained within this report are represented as [place] Standard Time. CONFIDENTIALTY NOTICE: This fax transmission is intended only for the addressee. It contains information that is legally privileged, confidential or otherwise protected from use or disclosure. If you are not the intended recipient, you are strictly prohibited from reviewing, disclosing, copying using or disseminating any of this information or taking any action in reliance on or regarding this information. If you have received this fax in error, please notify us immediately by telephone so that we can arrange for its return to [place]. Phone: [phone], Toll-Free: [phone], Fax: [phone] Page: 2 of 2 Call Id: [id number] Disagree/Comply: Comply Care Advice Given Per Guideline CARE ADVICE given per Neurologic Deficit (Adult) guideline. GO TO ED NOW: You need to be seen in the Emergency Department. Go to the ER at ___________ Hospital. Leave now. Drive carefully. DRIVING: Another adult should drive. AMBULANCE TRANSPORT (NOTE TO TRIAGER): If the patient cannot walk at all because of significant ataxia or leg weakness, then the patient will need to be transported via ambulance and examined at the emergency  department. The patient or family members can arrange ambulance transport via private ambulance company or  via EMS 911. After Care Instructions Given Call Event Type User Date / Time Description Referrals GO TO FACILITY UNDECIDED

## 2014-09-25 NOTE — ED Notes (Signed)
Admitting at bedside 

## 2014-09-25 NOTE — Progress Notes (Signed)
Pt placed on nasal cpap at this time. sp02 99%. Pt tolerating well at present

## 2014-09-25 NOTE — ED Provider Notes (Signed)
History   Chief Complaint  Patient presents with  . Dizziness  . Hypertension    HPI 44 year old female with past history as below notable for Down syndrome, diabetes, history of seizure, dementia, multiple admissions for pneumonia in the past who presents to ED, the by her mother who is providing history today for evaluation of dizziness for the past 1 week. Mom states over the past week patient has been specifically complaining of dizziness and shakiness. She says the patient does take seizure medications but has not had any seizures outside hospital and that the symptoms did not look like seizure activity. She does not note the patient having any objective shaking. Mom also denies patient having any recent illness or other infectious symptoms. Mom states today the patient was at adult daycare where she was walking and witnessed to slowly fall to the ground. This was witnessed by the staff at the adult daycare center and they did not note any injuries at that time. Since then the patient has had persistent difficulty ambulating. Patient has no history of this in the past. Mom states patient has a history of TIA/CVA in the past as well. Mom notes that patient's blood pressure has been fluctuating between 100-140 systolic. Patient has no complaint of dizziness in the past. Mom denies patient complaining of any pain. Specifically there've been no fevers, chills, urinary symptoms. Mom does not note any obvious weakness now. Mom also notes patient is walking normally now. No other complaints at this time.  Onset of symptoms: gradual. Duration 1 wk.  Modifying factors none.  Severity: mild-moderate.  Associated symptoms: none.  Hx of similar symptoms: no.    Past medical/surgical history, social history, medications, allergies and FH have been reviewed with patient and/or in documentation. Furthermore, if pt family or friend(s) present, additional historical information was obtained from them.  Past  Medical History  Diagnosis Date  . Down syndrome   . Pneumonia     multiple pneumonias  . Empyema of left pleural space 09/24/2011  . DJD (degenerative joint disease) of knee     bilat knee arthritis, had 3/3 Supartz injections [name])  . Depression     with OCD  . History of diabetes mellitus     during TPN  . Hypothyroid   . Seizure     x 2 during hospitalization  . Dementia due to another medical condition     Trisomy 21-related, sees [name] yearly, nl CT head 2010, did not tolerate aricept  . OSA on CPAP 05/2012    severe OSA with AHI 118, desat nadir to 67%  . Sleep-related hypoventilation 09/23/2012  . Eczema   . 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 Presenile dementia, uncomplicated    Past Surgical History  Procedure Laterality Date  . Back surgery      ruptured disc  . Anterior cruciate ligament repair      left  . Flexible bronchoscopy  09/24/2011    Procedure: FLEXIBLE BRONCHOSCOPY;  Surgeon: Purcell Nails, MD;  Location: [facility] OR;  Service: Thoracic;  Laterality: N/A;  . Tracheostomy tube placement  10/11/2011    Procedure: TRACHEOSTOMY;  Surgeon: Serena Colonel, MD;  Location: [facility] OR;  Service: ENT;  Laterality: N/A;  . Left vats      (Dr. Barry Dienes)   Family History  Problem Relation Age of Onset  . Arthritis Mother   . Hypertension Father   . Diabetes Father   . Cancer Neg Hx   . Coronary artery disease  Neg Hx   . Stroke Neg Hx    History  Substance Use Topics  . Smoking status: Never Smoker   . Smokeless tobacco: Never Used  . Alcohol Use: No     Review of Systems Unable to obtain 2/2 pt dementia and mental status   Physical Exam  Physical Exam  ED Triage Vitals  Enc Vitals Group     BP 09/25/14 1501 91/50 mmHg     Pulse Rate 09/25/14 1501 54     Resp 09/25/14 1501 16     Temp 09/25/14 1501 98.7 F (37.1 C)     Temp Source 09/25/14 1501 Oral     SpO2 09/25/14 1501 100 %     Weight 09/25/14 1501 225 lb (102.059 kg)     Height 09/25/14 1501  (1.473 m)      Head Cir --      Peak Flow --      Pain Score 09/25/14 1459 0     Pain Loc --      Pain Edu? --      Excl. in GC? --    Constitutional: Patient is a 44 year old female with notable [name] features who is resting comfortably in bed and in no apparent distress. Patient is able to communicate and answer simple questions. Head: Normocephalic and atraumatic.  Eyes: Extraocular motion intact, no scleral icterus Mouth: MMM, OP clear Neck: Supple without meningismus, mass, or overt JVD Respiratory: No respiratory distress. Normal WOB. No w/r/g. CV: RRR, no obvious murmurs.  Pulses +2 and symmetric. Euvolemic Abdomen: Soft, NT, ND, no r/g. No mass.  MSK: Extremities are atraumatic without deformity, ROM intact Skin: Warm, dry, intact without rash Neuro: AAOx4, MAE 5/5 sym, no focal deficit noted   ED Course  Procedures   Labs Reviewed  BASIC METABOLIC PANEL - Abnormal; Notable for the following:    Creatinine, Ser 1.24 (*)    GFR calc non Af Amer 52 (*)    All other components within normal limits  CBC - Abnormal; Notable for the following:    MCV 101.7 (*)    All other components within normal limits  MRSA PCR SCREENING  URINALYSIS, ROUTINE W REFLEX MICROSCOPIC (NOT AT [facility])  LEVETIRACETAM LEVEL  TSH  CORTISOL  BASIC METABOLIC PANEL  CBC  CBG MONITORING, ED  CBG MONITORING, ED   I personally reviewed and interpreted all labs.  CT Head Wo Contrast    (Results Pending)  DG Chest 2 View    (Results Pending)   I personally viewed above image(s) which were used in my medical decision making. Formal interpretations by Radiology.  MDM: [name] is a 44 y.o. female with H&P as above who p/w CC: Down syndrome with chief complaint of dizziness. Patient found to be hypotensive in triage. Blood pressure was rechecked during exam and was 117/70. Given the above history and physical, patient will get screening labs, screening EKG, chest x-ray, CT head for further evaluation  of possible metabolic, infectious or neurologic abnormality.  EKG shows sinus bradycardia and otherwise no ST-T wave abnormalities.  Patient's labs notable for slight bump in creatinine to 1.24.  HCT neg.CXR unremarkable     EMERGENCY DEPARTMENT [place] CARDIAC EXAM "Study: Limited Ultrasound of the heart and pericardium"  INDICATIONS:Hypotension Multiple views of the heart and pericardium are obtained with a multi-frequency probe.  PERFORMED ZO:XWRUEA and Other (See attached note). Dr. Hyacinth Meeker  IMAGES ARCHIVED?: No  FINDINGS: No pericardial effusion and Normal contractility  LIMITATIONS:  Body habitus  VIEWS USED: Subcostal 4 chamber and Parasternal long axis  INTERPRETATION: Cardiac activity present, Pericardial effusion present, Cardiac tamponade absent and Normal contractility  COMMENT:   Filed Vitals:   09/25/14 1630  BP: 107/65  Pulse: 64  Temp:   Resp: 19   IVF given. Filed Vitals:   09/25/14 2100 09/25/14 2115 09/25/14 2208 09/25/14 2315  BP: 107/55 101/52 96/44   Pulse: 87 61 59 60  Temp:   98.3 F (36.8 C)   TempSrc:   Oral   Resp:   18   Height:   4\' 10"  (1.473 m)   Weight:   211 lb 12.8 oz (96.072 kg)   SpO2: 99% 100% 98% 99%    Patient continues to be hypertensive in ED despite fluids. She is orthostatic in the ED. Given these findings I think patient will be best suited for admission hospital for further evaluation.  Old records reviewed (if available). Labs and imaging reviewed personally by myself and considered in medical decision making if ordered. Clinical Impression: 1. Orthostatic hypotension   2. Lightheadedness     Disposition: admit  Condition: Good  I have discussed the results, Dx and Tx plan with the pt(& family if present). He/she/they expressed understanding and agree(s) with the plan.  Pt seen in conjunction with Dr. Vinnie Langton, DO [facility] Emergency Medicine Resident - PGY-3      Ames Dura, MD 09/25/14  [id number]  Eber Hong, MD 09/25/14 [phone]

## 2014-09-25 NOTE — ED Notes (Signed)
Mother stated, she's been dizzy and falling since Wednesday.  It seems like her BP goes up when she falls or gets dizzy.

## 2014-09-25 NOTE — H&P (Signed)
[facility] Admission History and Physical       MEESHA SEK ZOX:[id number] DOB: 07/18/1970 DOA: 09/25/2014  Referring physician: EDP PCP: Eustaquio Boyden, MD  Specialists:   Chief Complaint: Dizziness  HPI: LATICHA FERRUCCI is a 44 y.o. female with a history of Down Syndrome, Seizure Disorder, Hypothyroid, OSA who presents to the ED with complaints of increased Dizziness x 10 days.    She has had increased falls for the past 2 days.   While at her Adult daycare center today she had increased weakness in both of her legs and was unable to stand up.   EMS was called and her blood pressures were found to be in the 80's.    Her mother is at the Bedside and reports that she has not had any Nausea, Vomiting, Diarrhea or Fevers or Chills, or Cough or Chest Pain or SOB. She was found to have orthostatic vitals in the ED, and A CT scan of the head was performed and was negative for acute fin dings.  She was administered 2 liters of IVFs in the ED and referred for admission.     Review of Systems:  Constitutional: No Weight Loss, No Weight Gain, Night Sweats, Fevers, Chills, +Dizziness, Light Headedness, Fatigue, or +Generalized Weakness HEENT: No Headaches, Difficulty Swallowing,Tooth/Dental Problems,Sore Throat,  No Sneezing, Rhinitis, Ear Ache, Nasal Congestion, or Post Nasal Drip,  Cardio-vascular:  No Chest pain, Orthopnea, PND, Edema in Lower Extremities, Anasarca, Dizziness, Palpitations  Resp: No Dyspnea, No DOE, No Productive Cough, No Non-Productive Cough, No Hemoptysis, No Wheezing.    GI: No Heartburn, Indigestion, Abdominal Pain, Nausea, Vomiting, Diarrhea, Constipation, Hematemesis, Hematochezia, Melena, Change in Bowel Habits,  Loss of Appetite  GU: No Dysuria, No Change in Color of Urine, No Urgency or Urinary Frequency, No Flank pain.  Musculoskeletal: No Joint Pain or Swelling, No Decreased Range of Motion, No Back Pain.  Neurologic: No Syncope, No Seizures, Muscle Weakness,  Paresthesia, Vision Disturbance or Loss, No Diplopia, No Vertigo, No Difficulty Walking,  Skin: + Boils,  No Rash  Psych: No Change in Mood or Affect, No Depression or Anxiety, No Memory loss, No Confusion, or Hallucinations   Past Medical History  Diagnosis Date  . Down syndrome   . Pneumonia     multiple pneumonias  . Empyema of left pleural space 09/24/2011  . DJD (degenerative joint disease) of knee     bilat knee arthritis, had 3/3 Supartz injections [name])  . Depression     with OCD  . History of diabetes mellitus     during TPN  . Hypothyroid   . Seizure     x 2 during hospitalization  . Dementia due to another medical condition     Trisomy 21-related, sees [name] yearly, nl CT head 2010, did not tolerate aricept  . OSA on CPAP 05/2012    severe OSA with AHI 118, desat nadir to 67%  . Sleep-related hypoventilation 09/23/2012  . Eczema   . 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 Presenile dementia, uncomplicated        Boils (Recurrent)   Past Surgical History  Procedure Laterality Date  . Back surgery      ruptured disc  . Anterior cruciate ligament repair      left  . Flexible bronchoscopy  09/24/2011    Procedure: FLEXIBLE BRONCHOSCOPY;  Surgeon: Purcell Nails, MD;  Location: [facility] OR;  Service: Thoracic;  Laterality: N/A;  . Tracheostomy tube placement  10/11/2011  Procedure: TRACHEOSTOMY;  Surgeon: Serena Colonel, MD;  Location: [facility] OR;  Service: ENT;  Laterality: N/A;  . Left vats      (Dr. Barry Dienes)      Prior to Admission medications   Medication Sig Start Date End Date Taking? Authorizing Provider  albuterol (PROVENTIL) (2.5 MG/3ML) 0.083% nebulizer solution Take 3 mLs (2.5 mg total) by nebulization every 6 (six) hours as needed for wheezing or shortness of breath. 04/17/13  Yes Eustaquio Boyden, MD  clobetasol cream (TEMOVATE) 0.05 % APPLY TO AFFECTED AREA TWO TIMES A DAY. Patient taking differently: APPLY TO AFFECTED AREA TWO TIMES A DAY AS NEEDED FOR AFFECTED AREA 07/03/14  Yes  Eustaquio Boyden, MD  levETIRAcetam (KEPPRA) 500 MG tablet TAKE 1 TABLET (500 MG TOTAL) BY MOUTH EVERY 12 (TWELVE) HOURS. 12/10/13  Yes Nilda Riggs, NP  levothyroxine (SYNTHROID, LEVOTHROID) 125 MCG tablet TAKE 1 TABLET BY MOUTH DAILY BEFORE BREAKFAST. 07/21/14  Yes Eustaquio Boyden, MD  memantine (NAMENDA) 10 MG tablet Take 1 tablet (10 mg total) by mouth 2 (two) times daily. 12/10/13  Yes Nilda Riggs, NP  Multiple Vitamins-Minerals (WOMENS MULTI PO) Take 1 tablet by mouth daily.    Yes Historical Provider, MD  mupirocin ointment (BACTROBAN) 2 % Apply 1 application topically 2 (two) times daily. 09/24/14  Yes Amy E Bedsole, MD  pantoprazole (PROTONIX) 40 MG tablet TAKE 1 TABLET BY MOUTH DAILY. 12/08/13  Yes Eustaquio Boyden, MD  clindamycin (CLEOCIN T) 1 % external solution Apply topically 2 (two) times daily. Patient not taking: Reported on 09/25/2014 07/13/14   Eustaquio Boyden, MD     No Known Allergies  Social History:  reports that she has never smoked. She has never used smokeless tobacco. She reports that she does not drink alcohol or use illicit drugs.    Family History  Problem Relation Age of Onset  . Arthritis Mother   . Hypertension Father   . Diabetes Father   . Cancer Neg Hx   . Coronary artery disease Neg Hx   . Stroke Neg Hx        Physical Exam:  GEN:  Pleasant Obese 44 y.o. Caucasian female examined and in no acute distress; cooperative with exam Filed Vitals:   09/25/14 1615 09/25/14 1630 09/25/14 1900 09/25/14 1917  BP: 101/41 107/65 100/47 98/63  Pulse: 70 64 55 69  Temp:      TempSrc:      Resp:  19  18  Height:      Weight:      SpO2: 99% 100% 100% 100%   Blood pressure 98/63, pulse 69, temperature 98.7 F (37.1 C), temperature source Oral, resp. rate 18, height  (1.473 m), weight 102.059 kg (225 lb), last menstrual period 09/18/2014, SpO2 100 %. PSYCH: She is alert and oriented x4; does not appear anxious does not appear  depressed; affect is normal HEENT: Normocephalic and Atraumatic, Mucous membranes pink; PERRLA; EOM intact; Fundi:  Benign;  No scleral icterus, Nares: Patent, Oropharynx: Clear, Fair Dentition,    Neck:  FROM, No Cervical Lymphadenopathy nor Thyromegaly or Carotid Bruit; No JVD; Breasts:: Not examined CHEST WALL: No tenderness CHEST: Normal respiration, clear to auscultation bilaterally HEART: Regular rate and rhythm; no murmurs rubs or gallops BACK: No kyphosis or scoliosis; No CVA tenderness ABDOMEN: Positive Bowel Sounds, Obese, Soft Non-Tender, No Rebound or Guarding; No Masses, No Organomegaly, No Pannus; No Intertriginous candida. Rectal Exam: Not done EXTREMITIES: No Cyanosis, Clubbing, or Edema; No Ulcerations. Genitalia: not  examined PULSES: 2+ and symmetric SKIN: + Perineal Boils,  No ulceration CNS:  Alert and Oriented x 4, No Focal Deficits Vascular: pulses palpable throughout    Labs on Admission:  Basic Metabolic Panel:  Recent Labs Lab 09/25/14 1527  NA 138  K 4.2  CL 102  CO2 29  GLUCOSE 86  BUN 11  CREATININE 1.24*  CALCIUM 9.3   Liver Function Tests: No results for input(s): AST, ALT, ALKPHOS, BILITOT, PROT, ALBUMIN in the last 168 hours. No results for input(s): LIPASE, AMYLASE in the last 168 hours. No results for input(s): AMMONIA in the last 168 hours. CBC:  Recent Labs Lab 09/25/14 1527  WBC 5.4  HGB 14.2  HCT 42.8  MCV 101.7*  PLT 223   Cardiac Enzymes: No results for input(s): CKTOTAL, CKMB, CKMBINDEX, TROPONINI in the last 168 hours.  BNP (last 3 results) No results for input(s): BNP in the last 8760 hours.  ProBNP (last 3 results) No results for input(s): PROBNP in the last 8760 hours.  CBG:  Recent Labs Lab 09/25/14 1524 09/25/14 1629  GLUCAP 84 83    Radiological Exams on Admission: Dg Chest 2 View  09/25/2014   CLINICAL DATA:  Difficulty breathing  EXAM: CHEST - 2 VIEW  COMPARISON:  02/01/2012  FINDINGS: Cardiac shadow  is mildly enlarged. The lungs are well aerated bilaterally without focal infiltrate or sizable effusion. No acute bony abnormality is seen.  IMPRESSION: Cardiomegaly without acute abnormality.   Electronically Signed   By: Alcide Clever M.D.   On: 09/25/2014 16:50   Ct Head Wo Contrast  09/25/2014   CLINICAL DATA:  44 year old female with dizziness, falls and weakness for for 5 days.  EXAM: CT HEAD WITHOUT CONTRAST  TECHNIQUE: Contiguous axial images were obtained from the base of the skull through the vertex without intravenous contrast.  COMPARISON:  01/05/2009 CT and 06/14/2005 MR  FINDINGS: No intracranial abnormalities are identified, including mass lesion or mass effect, hydrocephalus, extra-axial fluid collection, midline shift, hemorrhage, or acute infarction.  The visualized bony calvarium is unremarkable.  IMPRESSION: Unremarkable noncontrast head CT.   Electronically Signed   By: Harmon Pier M.D.   On: 09/25/2014 17:18     EKG: Independently reviewed.  Sinus Bradycardia  Rate =55         Assessment/Plan:   44 y.o. female with  Principal Problem:   1.    Orthostatic hypotension   IVFs for Fluid Resuscitation   Check Cortisol Level   Active Problems:   2.     Sinus Bradycardia   Cardiac Monitoring     3.    Perineal Area Abscess-    Continue Topical Mupirocin Ointment   MRSA Precautions     4.    Hypothyroid   Continue Levothyroxine   Check TSH Level     5.    Partial epilepsy with impairment of consciousness   Continue Keppra Rx     6.    Down syndrome   Congenital      7.    Presenile dementia, uncomplicated   Continue Namenda     8.    OSA on CPAP   Continue CPAP qhs   Monitor O2 sats     9.    DVT Prophylaxis   Lovenox    Code Status:     FULL CODE        Family Communication:   Family at Bedside     Disposition Plan:  Inpatient Status        Time spent:  90 Minutes      Ron Parker [facility] Pager [phone]   If 7AM -7PM  Please Contact the Day Rounding Team MD for [facility]  If 7PM-7AM, Please Contact Night-Floor Coverage  [email or web address] Password [facility] 09/25/2014, 8:38 PM     ADDENDUM:   Patient was seen and examined on 09/25/2014

## 2014-09-25 NOTE — Telephone Encounter (Signed)
Left detailed message on voicemail.  

## 2014-09-25 NOTE — Telephone Encounter (Signed)
If she can't move the leg currently, then ER.  If totally back to normal, then needs UC vs ER eval now (not later).

## 2014-09-25 NOTE — ED Provider Notes (Signed)
Down syndrom - 1 week of shakines and dizziness, hx of prior severe illness - had 2 month ICU stay for pna - trach / seizures.  She has done well since - no ongoing seizures.  Pt can't describe sx.  Level 5 caveat applies.  Had some trouble walking today - fell to ground gracefully, since then has had ongoing trouble walking until now when she feels better.  On exam - normal gait per mother - neuro unremarkable - no nystagmus,   Anticipate CT scan, labs, recheck VS - now normal BP.    I saw and evaluated the patient, reviewed the resident's note and I agree with the findings and plan.   EKG Interpretation  Date/Time:  Friday September 25 2014 14:59:01 EDT Ventricular Rate:  55 PR Interval:  124 QRS Duration: 72 QT Interval:  404 QTC Calculation: 386 R Axis:   89 Text Interpretation:  Sinus bradycardia Otherwise normal ECG since last tracing no significant change Confirmed by Myelle Poteat  MD, Pritika Alvarez ([id number]) on 09/25/2014 4:20:03 PM        I personally interpreted the EKG as well as the resident and agree with the interpretation on the resident's chart.  Final diagnoses:  Lightheadedness  Orthostatic hypotension      Eber Hong, MD 09/25/14 [phone]

## 2014-09-25 NOTE — Telephone Encounter (Signed)
Patient Name: [name] DOB: 21-Mar-1970 Initial Comment Caller states 2 days ago, patient could not move legs after using bathroom, then fine. Just happened today. BP 140, high for her. Nurse Assessment Nurse: Elijah Birk, RN, Lynda Date/Time (Eastern Time): 09/25/2014 1:20:17 PM Confirm and document reason for call. If symptomatic, describe symptoms. ---Caller states 2 days ago, patient could not move legs after using bathroom, then fine. Just happened today. BP 140 systolic, usually 110's systolic, high for her. She is at an adult day care center. Has the patient traveled out of the country within the last 30 days? ---Not Applicable Does the patient require triage? ---Yes Related visit to physician within the last 2 weeks? ---No Does the PT have any chronic conditions? (i.e. diabetes, asthma, etc.) ---Yes List chronic conditions. ---[name], thyroid, seizure rx, lung issues Did the patient indicate they were pregnant? ---No Guidelines Guideline Title Affirmed Question Affirmed Notes Neurologic Deficit Can't walk or can barely walk Final Disposition User Go to ED Now Elijah Birk, RN, Stark Bray Referrals GO TO FACILITY UNDECIDED Disagree/Comply: Comply

## 2014-09-25 NOTE — ED Notes (Signed)
MD at bedside. 

## 2014-09-26 ENCOUNTER — Inpatient Hospital Stay (HOSPITAL_COMMUNITY): Payer: Commercial Managed Care - HMO

## 2014-09-26 ENCOUNTER — Encounter (HOSPITAL_COMMUNITY): Payer: Self-pay

## 2014-09-26 DIAGNOSIS — G4733 Obstructive sleep apnea (adult) (pediatric): Secondary | ICD-10-CM

## 2014-09-26 DIAGNOSIS — E039 Hypothyroidism, unspecified: Secondary | ICD-10-CM

## 2014-09-26 DIAGNOSIS — I951 Orthostatic hypotension: Principal | ICD-10-CM

## 2014-09-26 DIAGNOSIS — R001 Bradycardia, unspecified: Secondary | ICD-10-CM

## 2014-09-26 DIAGNOSIS — R1030 Lower abdominal pain, unspecified: Secondary | ICD-10-CM

## 2014-09-26 DIAGNOSIS — Q909 Down syndrome, unspecified: Secondary | ICD-10-CM

## 2014-09-26 LAB — CBC
HEMATOCRIT: 37.8 % (ref 36.0–46.0)
Hemoglobin: 12.3 g/dL (ref 12.0–15.0)
MCH: 33.2 pg (ref 26.0–34.0)
MCHC: 32.5 g/dL (ref 30.0–36.0)
MCV: 102.2 fL — ABNORMAL HIGH (ref 78.0–100.0)
Platelets: 176 10*3/uL (ref 150–400)
RBC: 3.7 MIL/uL — ABNORMAL LOW (ref 3.87–5.11)
RDW: 14.5 % (ref 11.5–15.5)
WBC: 5.3 10*3/uL (ref 4.0–10.5)

## 2014-09-26 LAB — BASIC METABOLIC PANEL
Anion gap: 5 (ref 5–15)
BUN: 12 mg/dL (ref 6–20)
CO2: 26 mmol/L (ref 22–32)
Calcium: 8.2 mg/dL — ABNORMAL LOW (ref 8.9–10.3)
Chloride: 107 mmol/L (ref 101–111)
Creatinine, Ser: 1.08 mg/dL — ABNORMAL HIGH (ref 0.44–1.00)
GFR calc Af Amer: >60 mL/min (ref >60)
GFR calc non Af Amer: >60 mL/min (ref >60)
Glucose, Bld: 103 mg/dL — ABNORMAL HIGH (ref 65–99)
Potassium: 4 mmol/L (ref 3.5–5.1)
Sodium: 138 mmol/L (ref 135–145)

## 2014-09-26 LAB — MRSA PCR SCREENING: MRSA BY PCR: NEGATIVE

## 2014-09-26 LAB — TSH: TSH: 0.683 u[IU]/mL (ref 0.350–4.500)

## 2014-09-26 LAB — CORTISOL: Cortisol, Plasma: 10.9 ug/dL

## 2014-09-26 LAB — PREGNANCY, URINE: Preg Test, Ur: NEGATIVE

## 2014-09-26 MED ORDER — IOHEXOL 300 MG/ML  SOLN
25.0000 mL | INTRAMUSCULAR | Status: AC
  Administered 2014-09-26 (×2): 25 mL via ORAL

## 2014-09-26 MED ORDER — COSYNTROPIN 0.25 MG IJ SOLR
0.2500 mg | Freq: Once | INTRAMUSCULAR | Status: DC
  Filled 2014-09-26: qty 0.25

## 2014-09-26 MED ORDER — IOHEXOL 300 MG/ML  SOLN
100.0000 mL | Freq: Once | INTRAMUSCULAR | Status: AC | PRN
  Administered 2014-09-26: 100 mL via INTRAVENOUS

## 2014-09-26 NOTE — Progress Notes (Signed)
[facility] PROGRESS NOTE  [name] ZOX:[id number] DOB: [birth year] DOA: 09/25/2014  PCP: Christine Boyden, MD  Brief HPI: 44 year old Caucasian female with a past medical history of Down syndrome, seizure disorder, hypothyroidism and obstructive sleep apnea, presented with 10 day history of dizziness, falls. Most of the history was provided by her mother. She was noted to be hypotensive in the emergency department. She was given IV fluids and was hospitalized. She was also noted to be orthostatic.  Past medical history:  Past Medical History  Diagnosis Date  . Down syndrome   . Pneumonia     multiple pneumonias  . Empyema of left pleural space 09/24/2011  . Depression     with OCD  . Hypothyroid   . Dementia due to another medical condition     Trisomy 21-related, sees [name] yearly, nl CT head 2010, did not tolerate aricept  . Sleep-related hypoventilation 09/23/2012  . Eczema   . 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 Presenile dementia, uncomplicated   . On home oxygen therapy     "1L just at night" (09/25/2014)  . OSA on CPAP 05/2012    severe OSA with AHI 118, desat nadir to 67%  . Diabetes mellitus     "just when she was on TPN"  . Seizure 2013    x 2 ; "very mild"; during hospitalization "while coming off vent"  . DJD (degenerative joint disease) of knee     bilat knee arthritis, had 3/3 Supartz injections [name])  . OCD (obsessive compulsive disorder)     Consultants: None  Procedures:  2-D echocardiogram is pending  Antibiotics: None  Subjective: Patient unable to communicate well due to her Down syndrome. Her mother was at bedside. She did state that the patient was looking better this morning. Patient did mention abdominal pain. But unable to provide further details.  Objective: Vital Signs  Filed Vitals:   09/25/14 2315 09/26/14 0557 09/26/14 0727 09/26/14 0945  BP:  89/38 99/55 109/65  Pulse: 60 52 49 75  Temp:  97.9 F (36.6 C)    TempSrc:  Oral    Resp:   14    Height:      Weight:      SpO2: 99% 98%  100%    Intake/Output Summary (Last 24 hours) at 09/26/14 1145 Last data filed at 09/26/14 0949  Gross per 24 hour  Intake    240 ml  Output      0 ml  Net    240 ml   Filed Weights   09/25/14 1501 09/25/14 2208  Weight: 102.059 kg (225 lb) 96.072 kg (211 lb 12.8 oz)    General appearance: alert, cooperative, no distress and [name] features Resp: clear to auscultation bilaterally Cardio: regular rate and rhythm, S1, S2 normal, no murmur, click, rub or gallop GI: Abdomen is obese, soft. Tender in the lower quadrants bilaterally. No rebound, rigidity or guarding. No masses or organomegaly. Extremities: extremities normal, atraumatic, no cyanosis or edema Pulses: 2+ and symmetric Neurologic: No obvious focal deficits.  Lab Results:  Basic Metabolic Panel:  Recent Labs Lab 09/25/14 1527 09/26/14 0434  NA 138 138  K 4.2 4.0  CL 102 107  CO2 29 26  GLUCOSE 86 103*  BUN 11 12  CREATININE 1.24* 1.08*  CALCIUM 9.3 8.2*   CBC:  Recent Labs Lab 09/25/14 1527 09/26/14 0434  WBC 5.4 5.3  HGB 14.2 12.3  HCT 42.8 37.8  MCV 101.7* 102.2*  PLT  223 176    CBG:  Recent Labs Lab 09/25/14 1524 09/25/14 1629  GLUCAP 84 83    Recent Results (from the past 240 hour(s))  MRSA PCR Screening     Status: None   Collection Time: 09/25/14 11:30 PM  Result Value Ref Range Status   MRSA by PCR NEGATIVE NEGATIVE Final    Comment:        The GeneXpert MRSA Assay (FDA approved for NASAL specimens only), is one component of a comprehensive MRSA colonization surveillance program. It is not intended to diagnose MRSA infection nor to guide or monitor treatment for MRSA infections.       Studies/Results: Dg Chest 2 View  09/25/2014   CLINICAL DATA:  Difficulty breathing  EXAM: CHEST - 2 VIEW  COMPARISON:  02/01/2012  FINDINGS: Cardiac shadow is mildly enlarged. The lungs are well aerated bilaterally without focal  infiltrate or sizable effusion. No acute bony abnormality is seen.  IMPRESSION: Cardiomegaly without acute abnormality.   Electronically Signed   By: Alcide Clever M.D.   On: 09/25/2014 16:50   Ct Head Wo Contrast  09/25/2014   CLINICAL DATA:  44 year old female with dizziness, falls and weakness for for 5 days.  EXAM: CT HEAD WITHOUT CONTRAST  TECHNIQUE: Contiguous axial images were obtained from the base of the skull through the vertex without intravenous contrast.  COMPARISON:  01/05/2009 CT and 06/14/2005 MR  FINDINGS: No intracranial abnormalities are identified, including mass lesion or mass effect, hydrocephalus, extra-axial fluid collection, midline shift, hemorrhage, or acute infarction.  The visualized bony calvarium is unremarkable.  IMPRESSION: Unremarkable noncontrast head CT.   Electronically Signed   By: Harmon Pier M.D.   On: 09/25/2014 17:18    Medications:  Scheduled: . enoxaparin (LOVENOX) injection  40 mg Subcutaneous Q24H  . levETIRAcetam  500 mg Oral BID  . levothyroxine  125 mcg Oral QAC breakfast  . memantine  10 mg Oral BID  . multivitamin with minerals  1 tablet Oral Daily  . mupirocin ointment  1 application Topical BID  . pantoprazole  40 mg Oral Daily  . sodium chloride  3 mL Intravenous Q12H   Continuous: . sodium chloride 100 mL/hr at 09/26/14 0845   ZOX:[id number] **OR** acetaminophen, albuterol, alum & mag hydroxide-simeth, clobetasol cream, HYDROmorphone (DILAUDID) injection, ondansetron **OR** ondansetron (ZOFRAN) IV, oxyCODONE  Assessment/Plan:  Principal Problem:   Orthostatic hypotension Active Problems:   Hypothyroid   Down syndrome   OSA on CPAP   Partial epilepsy with impairment of consciousness   Presenile dementia, uncomplicated   Recurrent boils   Bradycardia    Orthostatic hypotension Patient was given IV fluids. Blood pressure Sosa improved. Blood pressure did not drop with standing position this morning. Heart rate is stable.  Etiology remains unclear. She is not on any blood pressure lowering agent. No reason to suggest dehydration. Patient's mother denied recent nausea, vomiting or diarrhea. No evidence for sepsis either. Cortisol level is 10. She does have abdominal pain which will be discussed below.  Lower abdominal pain with tenderness Etiology is unclear. Since history is limited we'll proceed with imaging studies. Proceed with CT scan of the abdomen, pelvis.  Cardiomegaly on chest x-ray A new finding for this patient. She does not have any evidence for fluid overload. Proceed with 2-D echocardiogram.  History of hypothyroidism Continue with Synthroid. TSH is normal  History of seizures None in the last 3 years, per mother. Continue Keppra  History of obstructive sleep apnea Continue CPAP  History of presenile Dementia Continue Namenda  Sinus bradycardia Patient is stable. EKG reviewed. Sinus bradycardia noted. No AV blocks. TSH is normal. Continue to monitor on telemetry. Await echocardiogram.  History of boils and other skin lesions in the perineal area Per patient's mother this Sosa been ongoing for 1 year. Continue topical agents. Follow-up on CT scan as mentioned above.  DVT Prophylaxis: Lovenox    Code Status: Full code  Family Communication: Discussed with the patient's mother  Disposition Plan: Await CT scan of the abdomen and pelvis. Await echocardiogram.    LOS: 1 day   [facility]  [facility] Pager [phone] 09/26/2014, 11:45 AM  If 7PM-7AM, please contact night-coverage at [email or web address], password [facility]

## 2014-09-26 NOTE — Progress Notes (Signed)
NURSING PROGRESS NOTE  [name] [id number] Admission Data: 09/26/2014 2:06 AM Attending Provider: Ron Parker, MD WUJ:[id number] Sharen Hones, MD Code Status: Full   [name] is a 44 y.o. female patient admitted from ED:  -No acute distress noted.  -No complaints of shortness of breath.  -No complaints of chest pain.   Cardiac Monitoring: Box # 18 Cardiac monitor yields:Normal Sinus Rhythm  Blood pressure 96/44, pulse 60, temperature 98.3 F (36.8 C), temperature source Oral, resp. rate 18, height  (1.473 m), weight 96.072 kg (211 lb 12.8 oz), last menstrual period 09/18/2014, SpO2 99 %.   IV Fluids:  IV in place, occlusive dsg intact without redness, IV cath {NS infusing at 100 ml/hr. Allergies:  Review of patient's allergies indicates no known allergies.  Past Medical History:   has a past medical history of Down syndrome; Pneumonia; Empyema of left pleural space (09/24/2011); Depression; Hypothyroid; Dementia due to another medical condition; Sleep-related hypoventilation (09/23/2012); Eczema; Dry ARMD (02/2013); Presenile dementia, uncomplicated; On home oxygen therapy; OSA on CPAP (05/2012); Diabetes mellitus; Seizure (2013); DJD (degenerative joint disease) of knee; and OCD (obsessive compulsive disorder).  Past Surgical History:   has past surgical history that includes Back surgery; Anterior cruciate ligament repair (Left); Flexible bronchoscopy (09/24/2011); Tracheostomy tube placement (10/11/2011); Video assisted thoracoscopy (vats) /thorocotomy/radioactive seed implant (Left, 09/24/2011); and Lumbar disc surgery.  Social History:   reports that she has never smoked. She has never used smokeless tobacco. She reports that she does not drink alcohol or use illicit drugs.  Skin: Intact  Patient/Family oriented to room. Information packet given to patient/family. Admission inpatient armband information verified with patient/family to include name and date of birth and  placed on patient arm. Side rails up x 2, fall assessment and education completed with patient/family. Patient/family able to verbalize understanding of risk associated with falls and verbalized understanding to call for assistance before getting out of bed. Call light within reach. Patient/family able to voice and demonstrate understanding of unit orientation instructions.

## 2014-09-27 ENCOUNTER — Inpatient Hospital Stay (HOSPITAL_COMMUNITY): Payer: Commercial Managed Care - HMO

## 2014-09-27 DIAGNOSIS — I517 Cardiomegaly: Secondary | ICD-10-CM

## 2014-09-27 LAB — CBC
HEMATOCRIT: 40.3 % (ref 36.0–46.0)
Hemoglobin: 13.4 g/dL (ref 12.0–15.0)
MCH: 33.8 pg (ref 26.0–34.0)
MCHC: 33.3 g/dL (ref 30.0–36.0)
MCV: 101.8 fL — ABNORMAL HIGH (ref 78.0–100.0)
Platelets: 181 10*3/uL (ref 150–400)
RBC: 3.96 MIL/uL (ref 3.87–5.11)
RDW: 14.3 % (ref 11.5–15.5)
WBC: 5.1 10*3/uL (ref 4.0–10.5)

## 2014-09-27 LAB — BASIC METABOLIC PANEL
Anion gap: 6 (ref 5–15)
BUN: 12 mg/dL (ref 6–20)
CALCIUM: 8.4 mg/dL — AB (ref 8.9–10.3)
CO2: 26 mmol/L (ref 22–32)
Chloride: 105 mmol/L (ref 101–111)
Creatinine, Ser: 0.94 mg/dL (ref 0.44–1.00)
GFR calc Af Amer: >60 mL/min (ref >60)
GFR calc non Af Amer: >60 mL/min (ref >60)
GLUCOSE: 89 mg/dL (ref 65–99)
Potassium: 4 mmol/L (ref 3.5–5.1)
Sodium: 137 mmol/L (ref 135–145)

## 2014-09-27 LAB — ACTH STIMULATION, 3 TIME POINTS
CORTISOL 30 MIN: 18.3 ug/dL
CORTISOL 60 MIN: 20.2 ug/dL
Cortisol, Base: 10.3 ug/dL

## 2014-09-27 NOTE — Progress Notes (Signed)
  Echocardiogram 2D Echocardiogram has been performed.  [name] 09/27/2014, 3:07 PM

## 2014-09-27 NOTE — Progress Notes (Signed)
Pt. Was able to ambulate in halls w/o needing any oxygen supplement. At rest, pt. O2 sat was 98%. Walking w/o O2, was 97%, but was able to come back up to 100%. No further needs noted at this time.  

## 2014-09-27 NOTE — Discharge Summary (Signed)
[facility]  Physician Discharge Summary   Patient ID: [name] MRN: [id number] DOB/AGE: Aug 24, 1970 44 y.o.  Admit date: 09/25/2014 Discharge date: 09/27/2014  PCP: Eustaquio Boyden, MD  DISCHARGE DIAGNOSES:  Principal Problem:   Orthostatic hypotension Active Problems:   Hypothyroid   Down syndrome   OSA on CPAP   Partial epilepsy with impairment of consciousness   Presenile dementia, uncomplicated   Recurrent boils   Bradycardia   RECOMMENDATIONS FOR OUTPATIENT FOLLOW UP: Please see below regarding her GI symptoms.  DISCHARGE CONDITION: fair  Diet recommendation: As before  [facility] Weights   09/25/14 1501 09/25/14 2208  Weight: 102.059 kg (225 lb) 96.072 kg (211 lb 12.8 oz)    INITIAL HISTORY: 44 year old Caucasian female with a past medical history of Down syndrome, seizure disorder, hypothyroidism and obstructive sleep apnea, presented with 10 day history of dizziness, falls. Most of the history was provided by her mother. She was noted to be hypotensive in the emergency department. She was given IV fluids and was hospitalized. She was also noted to be orthostatic.  Consultations:  None  Procedures: 2-D echocardiogram Study Conclusions - Left ventricle: The cavity size was normal. Systolic function wasnormal. The estimated ejection fraction was in the range of 55%to 60%. Wall motion was normal; there were no regional wallmotion abnormalities. - Atrial septum: No defect or patent foramen ovale was identified.  HOSPITAL COURSE:   Orthostatic hypotension Patient was given IV fluids. Blood pressure has improved. Blood pressure did not drop with standing position on subsequent orthostatics. Heart rate is stable. Etiology for her symptoms remains unclear. She is not on any blood pressure lowering agent. No reason to suggest dehydration. Patient's mother denied recent nausea, vomiting or diarrhea. No evidence for sepsis either. Cortisol level is 10.  Cosyntropin stimulation test was performed which revealed a normal response. She did have abdominal pain which will be discussed below. This morning she was ambulated in the hallway without any difficulties. Her blood pressures have been stable. Review of her old records show that her blood pressure usually runs low.  Lower abdominal pain with tenderness She was noted to have some tenderness in the lower part of her abdomen. CT of the abdomen, pelvis was performed which was unremarkable. Discussed in detail with the patient's mother. Apparently, patient does have some discomfort, especially when she has a bowel movement. Denies any diarrhea. Bowel is not hard and patient usually does not have to strain. I have recommended that they discuss this further with the PCP. Stool softeners may help if there is constipation, or if the patient has to strain quite a bit.   Cardiomegaly on chest x-ray This was noted on chest x-ray. Echocardiogram was ordered. EF is normal. Patient's mother was reassured.   History of hypothyroidism Continue with Synthroid. TSH is normal  History of seizures None in the last 3 years, per mother. Continue Keppra  History of obstructive sleep apnea Continue CPAP  History of presenile Dementia Continue Namenda  Sinus bradycardia Patient is stable. EKG reviewed. Sinus bradycardia noted. No AV blocks. TSH is normal. Echocardiogram as above.  History of boils and other skin lesions in the perineal area Per patient's mother this has been ongoing for 1 year. Continue topical agents. No concerning findings on CT scan.   Overall, stable. Patient has ambulated in the hallway without any difficulties today. She feels better. Okay for discharge home.   PERTINENT LABS:  The results of significant diagnostics from this hospitalization (including imaging, microbiology,  ancillary and laboratory) are listed below for reference.    Microbiology: Recent Results (from the past 240  hour(s))  MRSA PCR Screening     Status: None   Collection Time: 09/25/14 11:30 PM  Result Value Ref Range Status   MRSA by PCR NEGATIVE NEGATIVE Final    Comment:        The GeneXpert MRSA Assay (FDA approved for NASAL specimens only), is one component of a comprehensive MRSA colonization surveillance program. It is not intended to diagnose MRSA infection nor to guide or monitor treatment for MRSA infections.      Labs: Basic Metabolic Panel:  Recent Labs Lab 09/25/14 1527 09/26/14 0434 09/27/14 0700  NA 138 138 137  K 4.2 4.0 4.0  CL 102 107 105  CO2 29 26 26   GLUCOSE 86 103* 89  BUN 11 12 12   CREATININE 1.24* 1.08* 0.94  CALCIUM 9.3 8.2* 8.4*   CBC:  Recent Labs Lab 09/25/14 1527 09/26/14 0434 09/27/14 0700  WBC 5.4 5.3 5.1  HGB 14.2 12.3 13.4  HCT 42.8 37.8 40.3  MCV 101.7* 102.2* 101.8*  PLT 223 176 181   CBG:  Recent Labs Lab 09/25/14 1524 09/25/14 1629  GLUCAP 84 83     IMAGING STUDIES Dg Chest 2 View  09/25/2014   CLINICAL DATA:  Difficulty breathing  EXAM: CHEST - 2 VIEW  COMPARISON:  02/01/2012  FINDINGS: Cardiac shadow is mildly enlarged. The lungs are well aerated bilaterally without focal infiltrate or sizable effusion. No acute bony abnormality is seen.  IMPRESSION: Cardiomegaly without acute abnormality.   Electronically Signed   By: Alcide Clever M.D.   On: 09/25/2014 16:50   Ct Head Wo Contrast  09/25/2014   CLINICAL DATA:  44 year old female with dizziness, falls and weakness for for 5 days.  EXAM: CT HEAD WITHOUT CONTRAST  TECHNIQUE: Contiguous axial images were obtained from the base of the skull through the vertex without intravenous contrast.  COMPARISON:  01/05/2009 CT and 06/14/2005 MR  FINDINGS: No intracranial abnormalities are identified, including mass lesion or mass effect, hydrocephalus, extra-axial fluid collection, midline shift, hemorrhage, or acute infarction.  The visualized bony calvarium is unremarkable.  IMPRESSION:  Unremarkable noncontrast head CT.   Electronically Signed   By: Harmon Pier M.D.   On: 09/25/2014 17:18   Ct Abdomen Pelvis W Contrast  09/26/2014   CLINICAL DATA:  Lower abdominal pain for 10 days. Orthostatic hypotension. Dizziness.  EXAM: CT ABDOMEN AND PELVIS WITH CONTRAST  TECHNIQUE: Multidetector CT imaging of the abdomen and pelvis was performed using the standard protocol following bolus administration of intravenous contrast.  CONTRAST:  OMNIPAQUE IOHEXOL 300 MG/ML  SOLN  COMPARISON:  None.  FINDINGS: Lower Chest: No acute findings.  Hepatobiliary: No masses or other significant abnormality identified. Gallbladder is unremarkable.  Pancreas: No mass, inflammatory changes, or other significant abnormality identified.  Spleen:  Within normal limits in size and appearance.  Adrenals:  No masses identified.  Kidneys/Urinary Tract:  No evidence of masses or hydronephrosis.  Stomach/Bowel/Peritoneum: No evidence of wall thickening, mass, or obstruction. Normal appendix visualized.  Vascular/Lymphatic: No pathologically enlarged lymph nodes identified. No abdominal aortic aneurysm or other significant retroperitoneal abnormality demonstrated.  Reproductive:  No mass or other significant abnormality identified.  Other:  None.  Musculoskeletal: No suspicious bone lesions identified. Lumbar spine degenerative changes noted.  IMPRESSION: No acute findings or other significant abnormality identified within the abdomen or pelvis.   Electronically Signed   By: Jonny Ruiz  Eppie Gibson M.D.   On: 09/26/2014 19:54    DISCHARGE EXAMINATION: Filed Vitals:   09/26/14 1600 09/26/14 2130 09/27/14 0021 09/27/14 0509  BP: 93/42 91/44 111/54 117/71  Pulse: 68 66 69 60  Temp: 98 F (36.7 C) 97.7 F (36.5 C) 98 F (36.7 C) 98.3 F (36.8 C)  TempSrc: Oral Oral  Oral  Resp: 18 18 18 18   Height:      Weight:      SpO2: 97% 100% 97% 96%   General appearance: alert, cooperative, appears stated age and no distress Resp:  clear to auscultation bilaterally Cardio: regular rate and rhythm, S1, S2 normal, no murmur, click, rub or gallop GI: Soft, nontender today. No masses or organomegaly. Bowel sounds are present.  DISPOSITION: Home with mother  Discharge Instructions    Call MD for:  difficulty breathing, headache or visual disturbances    Complete by:  As directed      Call MD for:  extreme fatigue    Complete by:  As directed      Call MD for:  persistant dizziness or light-headedness    Complete by:  As directed      Call MD for:  persistant nausea and vomiting    Complete by:  As directed      Call MD for:  severe uncontrolled pain    Complete by:  As directed      Call MD for:  temperature >100.4    Complete by:  As directed      Discharge instructions    Complete by:  As directed   Please follow-up with your primary care physician for further evaluation of your symptoms. Take stool softeners as needed if there is constipation or if you have to strain to have a bowel movement.  You were cared for by a hospitalist during your hospital stay. If you have any questions about your discharge medications or the care you received while you were in the hospital after you are discharged, you can call the unit and asked to speak with the hospitalist on call if the hospitalist that took care of you is not available. Once you are discharged, your primary care physician will handle any further medical issues. Please note that NO REFILLS for any discharge medications will be authorized once you are discharged, as it is imperative that you return to your primary care physician (or establish a relationship with a primary care physician if you do not have one) for your aftercare needs so that they can reassess your need for medications and monitor your lab values. If you do not have a primary care physician, you can call [phone] for a physician referral.     Increase activity slowly    Complete by:  As directed              ALLERGIES: No Known Allergies   Current Discharge Medication List    CONTINUE these medications which have NOT CHANGED   Details  albuterol (PROVENTIL) (2.5 MG/3ML) 0.083% nebulizer solution Take 3 mLs (2.5 mg total) by nebulization every 6 (six) hours as needed for wheezing or shortness of breath. Qty: 75 mL, Refills: 0    clobetasol cream (TEMOVATE) 0.05 % APPLY TO AFFECTED AREA TWO TIMES A DAY. Qty: 60 g, Refills: 0    levETIRAcetam (KEPPRA) 500 MG tablet TAKE 1 TABLET (500 MG TOTAL) BY MOUTH EVERY 12 (TWELVE) HOURS. Qty: 180 tablet, Refills: 3    levothyroxine (SYNTHROID, LEVOTHROID) 125 MCG tablet TAKE  1 TABLET BY MOUTH DAILY BEFORE BREAKFAST. Qty: 90 tablet, Refills: 1    memantine (NAMENDA) 10 MG tablet Take 1 tablet (10 mg total) by mouth 2 (two) times daily. Qty: 60 tablet, Refills: 11    Multiple Vitamins-Minerals (WOMENS MULTI PO) Take 1 tablet by mouth daily.     mupirocin ointment (BACTROBAN) 2 % Apply 1 application topically 2 (two) times daily. Qty: 22 g, Refills: 0    pantoprazole (PROTONIX) 40 MG tablet TAKE 1 TABLET BY MOUTH DAILY. Qty: 30 tablet, Refills: 8      STOP taking these medications     clindamycin (CLEOCIN T) 1 % external solution        Follow-up Information    Follow up with Eustaquio Boyden, MD. Schedule an appointment as soon as possible for a visit in 1 week.   Specialty:  Family Medicine   Why:  post hospitalization follow up   Contact information:   [address] [place] [address] [phone]       TOTAL DISCHARGE TIME: 35 minutes  [facility] [place]  [facility] Pager [phone]  09/27/2014, 3:22 PM

## 2014-09-27 NOTE — Progress Notes (Signed)
Utilization Review Completed.[name] T7/31/2016  

## 2014-09-28 ENCOUNTER — Telehealth: Payer: Self-pay | Admitting: *Deleted

## 2014-09-28 LAB — LEVETIRACETAM LEVEL: Levetiracetam Lvl: 7.4 ug/mL — ABNORMAL LOW (ref 10.0–40.0)

## 2014-09-28 NOTE — Telephone Encounter (Signed)
Transition Care Management Follow-up Telephone Call   Date discharged? 09/27/14   How have you been since you were released from the hospital? Spoke to patient's mother, she is doing well today.   Do you understand why you were in the hospital? yes   Do you understand the discharge instructions? yes   Where were you discharged to? Home   Items Reviewed:  Medications reviewed: yes  Allergies reviewed: yes  Dietary changes reviewed: yes  Referrals reviewed: yes, possible f/u with GI needed.   Functional Questionnaire:   Activities of Daily Living (ADLs):   She states they are independent in the following: ambulation, bathing and hygiene, feeding, continence, grooming, toileting and dressing States they require assistance with the following: None, at baseline   Any transportation issues/concerns?: no   Any patient concerns? no   Confirmed importance and date/time of follow-up visits scheduled yes, 10/02/14 @ 1500  Provider Appointment booked with Eustaquio Boyden, MD  Confirmed with patient if condition begins to worsen call PCP or go to the ER.  Patient was given the office number and encouraged to call back with question or concerns.  : yes

## 2014-09-30 ENCOUNTER — Telehealth: Payer: Self-pay | Admitting: Family Medicine

## 2014-09-30 NOTE — Telephone Encounter (Signed)
John from [place] called to inform you of patient admission to [facility] on 09/25/14. Post ER Hypotension due to drugs.

## 2014-10-02 ENCOUNTER — Ambulatory Visit (INDEPENDENT_AMBULATORY_CARE_PROVIDER_SITE_OTHER): Payer: Commercial Managed Care - HMO | Admitting: Family Medicine

## 2014-10-02 ENCOUNTER — Encounter: Payer: Self-pay | Admitting: Family Medicine

## 2014-10-02 VITALS — BP 104/64 | HR 60 | Temp 97.8°F | Wt 225.5 lb

## 2014-10-02 DIAGNOSIS — R1084 Generalized abdominal pain: Secondary | ICD-10-CM | POA: Diagnosis not present

## 2014-10-02 DIAGNOSIS — L609 Nail disorder, unspecified: Secondary | ICD-10-CM | POA: Insufficient documentation

## 2014-10-02 DIAGNOSIS — I951 Orthostatic hypotension: Secondary | ICD-10-CM | POA: Diagnosis not present

## 2014-10-02 DIAGNOSIS — R1030 Lower abdominal pain, unspecified: Secondary | ICD-10-CM | POA: Insufficient documentation

## 2014-10-02 NOTE — Progress Notes (Signed)
BP 104/64 mmHg  Pulse 60  Temp(Src) 97.8 F (36.6 C) (Oral)  Wt 225 lb 8 oz (102.286 kg)  LMP 09/18/2014   CC: TCM f/u visit  Subjective:    Patient ID: [name], female    DOB: [birth year], 44 y.o.   MRN: [id number]  HPI: [name] is a 44 y.o. female presenting on 10/02/2014 for Follow-up   Recent hospitalization for dizziness, falls and orthostasis. Found to be hypotensive in the ER, resolved after IVF rehydration. Unrevealing orthostasis evaluation - normal cortisol level, cosyntropin stim test, normal head CT. She did have abdominal pain but CT abd/pelvis was normal. CXR showed cardiomegaly but echocardiogram returned normal.  Nail abnormality with ingrown toenails - stay tender and get infected when mom tries to trim nails. Requests referral to podiatrist.  Admit date: 09/25/2014 Discharge date: 09/27/2014 F/u phone call: 09/28/2014  DISCHARGE DIAGNOSES:  Principal Problem:  Orthostatic hypotension Active Problems:  Hypothyroid  Down syndrome  OSA on CPAP  Partial epilepsy with impairment of consciousness  Presenile dementia, uncomplicated  Recurrent boils  Bradycardia  RECOMMENDATIONS FOR OUTPATIENT FOLLOW UP: Please see below regarding her GI symptoms.  Procedures: 2-D echocardiogram Study Conclusions - Left ventricle: The cavity size was normal. Systolic function wasnormal. The estimated ejection fraction was in the range of 55%to 60%. Wall motion was normal; there were no regional wallmotion abnormalities. - Atrial septum: No defect or patent foramen ovale was identified.  Relevant past medical, surgical, family and social history reviewed and updated as indicated. Interim medical history since our last visit reviewed. Allergies and medications reviewed and updated. Current Outpatient Prescriptions on File Prior to Visit  Medication Sig  . albuterol (PROVENTIL) (2.5 MG/3ML) 0.083% nebulizer solution Take 3 mLs (2.5 mg total) by nebulization  every 6 (six) hours as needed for wheezing or shortness of breath.  . clobetasol cream (TEMOVATE) 0.05 % APPLY TO AFFECTED AREA TWO TIMES A DAY. (Patient taking differently: APPLY TO AFFECTED AREA TWO TIMES A DAY AS NEEDED FOR AFFECTED AREA)  . levETIRAcetam (KEPPRA) 500 MG tablet TAKE 1 TABLET (500 MG TOTAL) BY MOUTH EVERY 12 (TWELVE) HOURS.  Marland Kitchen levothyroxine (SYNTHROID, LEVOTHROID) 125 MCG tablet TAKE 1 TABLET BY MOUTH DAILY BEFORE BREAKFAST.  . memantine (NAMENDA) 10 MG tablet Take 1 tablet (10 mg total) by mouth 2 (two) times daily.  . Multiple Vitamins-Minerals (WOMENS MULTI PO) Take 1 tablet by mouth daily.   . mupirocin ointment (BACTROBAN) 2 % Apply 1 application topically 2 (two) times daily.  . pantoprazole (PROTONIX) 40 MG tablet TAKE 1 TABLET BY MOUTH DAILY.   No current facility-administered medications on file prior to visit.    Review of Systems Per HPI unless specifically indicated above     Objective:    BP 104/64 mmHg  Pulse 60  Temp(Src) 97.8 F (36.6 C) (Oral)  Wt 225 lb 8 oz (102.286 kg)  LMP 09/18/2014  Wt Readings from Last 3 Encounters:  10/02/14 225 lb 8 oz (102.286 kg)  09/25/14 211 lb 12.8 oz (96.072 kg)  07/13/14 225 lb 12.8 oz (102.422 kg)   Body mass index is 47.14 kg/(m^2).  Physical Exam  Constitutional: She appears well-developed and well-nourished. No distress.  HENT:  Mouth/Throat: Oropharynx is clear and moist. No oropharyngeal exudate.  Cardiovascular: Normal rate, regular rhythm, normal heart sounds and intact distal pulses.   No murmur heard. Pulmonary/Chest: Effort normal and breath sounds normal. No respiratory distress. She has no wheezes. She has no rales.  Abdominal: Soft. Bowel sounds are normal. She exhibits distension. She exhibits no mass. There is tenderness (mild diffuse). There is no rigidity, no rebound, no guarding and negative Murphy's sign.  obese  Musculoskeletal: She exhibits no edema.  Bilateral toenails with mild  onychomycosis and pinching of distal nails into digit pulp  Nursing note and vitals reviewed.     Assessment & Plan:   Problem List Items Addressed This Visit    Abdominal pain    With normal abd/pelvic CT and labs during recent hospitalization Mom concerned about celiac disease. Reassured as no anemia.  ?IBS given pain improves with BM. Will check anti ttg IgA at next labwork and trial probiotic daily. Recent UA normal.      Relevant Orders   Hepatic function panel   Tissue transglutaminase, IgA   Nail abnormality    With ingrown toenail and anticipate beginnings of onychomycosis. Will refer to podiatry per mom request.      Orthostatic hypotension - Primary    Recent orthostasis with falls s/p normal evaluation during recent hospitalization. Anticipate dehydration related. Discussed importance of good hydration status. rec aim for 64 oz water daily. Mom agrees with plan.          Follow up plan: Return as needed.

## 2014-10-02 NOTE — Assessment & Plan Note (Signed)
Recent orthostasis with falls s/p normal evaluation during recent hospitalization. Anticipate dehydration related. Discussed importance of good hydration status. rec aim for 64 oz water daily. Mom agrees with plan.

## 2014-10-02 NOTE — Assessment & Plan Note (Signed)
With ingrown toenail and anticipate beginnings of onychomycosis. Will refer to podiatry per mom request.

## 2014-10-02 NOTE — Patient Instructions (Addendum)
Return for flu shot and labs (I have ordered today). We will refer you to a foot doctor. Start [place] or align probiotic daily.

## 2014-10-02 NOTE — Assessment & Plan Note (Addendum)
With normal abd/pelvic CT and labs during recent hospitalization Mom concerned about celiac disease. Reassured as no anemia.  ?IBS given pain improves with BM. Will check anti ttg IgA at next labwork and trial probiotic daily. Recent UA normal.

## 2014-10-02 NOTE — Progress Notes (Signed)
Pre visit review using our clinic review tool, if applicable. No additional management support is needed unless otherwise documented below in the visit note. 

## 2014-10-15 ENCOUNTER — Ambulatory Visit (INDEPENDENT_AMBULATORY_CARE_PROVIDER_SITE_OTHER): Payer: Commercial Managed Care - HMO | Admitting: Podiatry

## 2014-10-15 ENCOUNTER — Encounter: Payer: Self-pay | Admitting: Podiatry

## 2014-10-15 VITALS — BP 120/60 | HR 64 | Resp 12

## 2014-10-15 DIAGNOSIS — B351 Tinea unguium: Secondary | ICD-10-CM | POA: Diagnosis not present

## 2014-10-15 DIAGNOSIS — M79673 Pain in unspecified foot: Secondary | ICD-10-CM | POA: Diagnosis not present

## 2014-10-15 NOTE — Progress Notes (Signed)
   Subjective:    Patient ID: [name], female    DOB: 16-Mar-1970, 44 y.o.   MRN: [id number]  HPI : She presents today with her sister and niece the chief complaint of painful toenails bilateral. She states that they curve in and she can't cut them.    Review of Systems  All other systems reviewed and are negative.      Objective:   Physical Exam : 44 year old white female with Down syndrome in no acute distress. Pulses are strongly palpable vital signs are stable she is alert and oriented 3. Neurologic sensorium is intact she is very ticklish bilateral. Deep tendon reflexes are intact bilateral muscle strength is 5 over 5 dorsiflexion plantarflexion and inversion everters on physical musculatures intact. Orthopedic evaluation demonstrates pes planus bilateral. Cutaneous evaluation of Mr. Supple well-hydrated cutis nails are thick yellow dystrophic onychomycotic and sharply incurvated bilateral. No signs of infection. No open wounds.          Assessment & Plan:   assessment: Pain limb secondary to sharply graded nails in onychomycosis.  Plan: debridement of nails 1 through 5 bilateral scope service secondary to pain. Follow-up with me periodically for debridement.

## 2014-11-13 ENCOUNTER — Ambulatory Visit: Payer: Commercial Managed Care - HMO

## 2014-11-19 ENCOUNTER — Ambulatory Visit (INDEPENDENT_AMBULATORY_CARE_PROVIDER_SITE_OTHER): Payer: Commercial Managed Care - HMO

## 2014-11-19 ENCOUNTER — Other Ambulatory Visit: Payer: Self-pay | Admitting: Family Medicine

## 2014-11-19 DIAGNOSIS — Z23 Encounter for immunization: Secondary | ICD-10-CM

## 2014-12-11 ENCOUNTER — Encounter: Payer: Self-pay | Admitting: Neurology

## 2014-12-11 ENCOUNTER — Ambulatory Visit (INDEPENDENT_AMBULATORY_CARE_PROVIDER_SITE_OTHER): Payer: Commercial Managed Care - HMO | Admitting: Neurology

## 2014-12-11 VITALS — BP 96/62 | HR 60 | Resp 16 | Ht 62.0 in | Wt 227.4 lb

## 2014-12-11 DIAGNOSIS — Q909 Down syndrome, unspecified: Secondary | ICD-10-CM

## 2014-12-11 MED ORDER — LEVETIRACETAM 500 MG PO TABS: ORAL_TABLET | ORAL | Status: DC

## 2014-12-11 MED ORDER — MEMANTINE HCL 10 MG PO TABS: 10.0000 mg | ORAL_TABLET | Freq: Two times a day (BID) | ORAL | Status: DC

## 2014-12-11 NOTE — Patient Instructions (Signed)
I had a long discussion with the patient and sister regarding her seizure disorder which appears to be stable and she is tolerating the current dose of Keppra 500 twice daily without side effects and we will continue it and refill it for a year. Also continue Namenda 10 mg twice daily which is also tolerating well and she has remained cognitively stable. We will refill this also for a year. Return for follow-up in one year with Latrelle Dodrillaroline Martin, nurse practitioner call earlier if necessary.

## 2014-12-11 NOTE — Progress Notes (Signed)
[facility]  PATIENT: [name] DOB: 09/08/1970   REASON FOR VISIT: for seizure disorder     HISTORY OF PRESENT ILLNESS:[name], 44 year old female returns for followup with her mother. She has a history of Downs syndrome and  cognitive decline over several years as well as seizure disorder. She is currently well controlled on Keppra and she is on Namenda without side effects to either drug. She is overweight, gets little exercise, lives at home. She has no new medical issues. She has no new complaints . She occasionally has behavior problems. She needs refills on her medications  HISTORY: [name] is a 65 year year old Caucasian lady with lifelong history of Down`s syndrome and cognitive decline in last several years from dementia who is referred back to see need for a new problem of seizures. She had recent prolonged hospital stay [facility] pneumonia with empyema and ventilator dependent respiratory failure. She underwent video assisted thoracotomy for empyma drainage. She required prolonged antibiotics and was eventually required tracheostomy. She had 2 brief episodes of what sound like complex partial seizures one week apart in September 2013. Her mother who is present today was eyewitness to both episodes. She was described as staring and unresponsive not following commands with a distant look on her face lasting a few minutes followed by a period of disorientation and tiredness. In the second episode she had some clonic activity in both her upper extremities. She was started on Keppra 500 twice a day falling and abnormal EEG on 11/04/2011 and with showed sharp waves in the left occipital parietal region. She was initially off her Namenda and Prozac in the hospital stay and had some behavioral agitation but settled down after the medicines were restarted. She has done well since discharge and has not had any more seizures.  She seems to be tolerating Keppra quite well without any side effects. She is finished a course of antibiotics and home physical and occupation therapy. She is back living at home with mom and plans to start attending adult daycare soon. She apparently did not have any brain imaging study done.  03/11/12: [name] returns for followup. She was last seen by Dr. [name] 12/11/11: She had prolonged hospitilization in Sept 2013 and had seizure events. She was started on Keppra at that time. No further seizure events. MRI of the brain 12/18/11 was normal. EEG was abnormal suggestive of mild focal irritability in right frontal and temporal regions but no definite epileptiform  activity is noted. ROS neg.   Update 12/11/14 : She returns for follow-up after last visit a year ago. She is a complaint by sister who provides most of the history. She continues to do well and has not had any breakthrough seizures now for a few years. Her memory and cognitive difficulties remain unchanged. She requires close supervision at home and does spend the day at the daycare center. She can feed dress herself and bathe herself. She is never left alone. There have been no issues with agitation, behavior, delusions or hallucinations or safety concerns. Her gait imbalance remained stable and she has had no falls.   REVIEW OF SYSTEMS: Full 14 system review of systems performed and notable only for those listed, all others are neg:   No complaints today  ALLERGIES: No Known Allergies  HOME MEDICATIONS: Outpatient Prescriptions Prior to Visit  Medication Sig Dispense Refill  . albuterol (PROVENTIL) (2.5 MG/3ML) 0.083% nebulizer solution Take  3 mLs (2.5 mg total) by nebulization every 6 (six) hours as needed for wheezing or shortness of breath. 75 mL 0  . clindamycin (CLEOCIN T) 1 % external solution     . clobetasol cream (TEMOVATE) 0.05 % APPLY TO AFFECTED AREA TWO TIMES A DAY. (Patient taking differently: APPLY TO AFFECTED AREA  TWO TIMES A DAY AS NEEDED FOR AFFECTED AREA) 60 g 0  . levothyroxine (SYNTHROID, LEVOTHROID) 125 MCG tablet TAKE 1 TABLET BY MOUTH DAILY BEFORE BREAKFAST. 90 tablet 1  . Multiple Vitamins-Minerals (WOMENS MULTI PO) Take 1 tablet by mouth daily.     . mupirocin ointment (BACTROBAN) 2 % Apply 1 application topically 2 (two) times daily. 22 g 0  . pantoprazole (PROTONIX) 40 MG tablet TAKE 1 TABLET BY MOUTH DAILY. 30 tablet 11  . levETIRAcetam (KEPPRA) 500 MG tablet TAKE 1 TABLET (500 MG TOTAL) BY MOUTH EVERY 12 (TWELVE) HOURS. 180 tablet 3  . memantine (NAMENDA) 10 MG tablet Take 1 tablet (10 mg total) by mouth 2 (two) times daily. 60 tablet 11   No facility-administered medications prior to visit.    PAST MEDICAL HISTORY: Past Medical History  Diagnosis Date  . Down syndrome   . Pneumonia     multiple pneumonias  . Empyema of left pleural space (HCC) 09/24/2011  . Depression     with OCD  . Hypothyroid   . Dementia due to another medical condition     Trisomy 21-related, sees [name] yearly, nl CT head 2010, did not tolerate aricept  . Sleep-related hypoventilation 09/23/2012  . Eczema   . 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 Presenile dementia, uncomplicated   . On home oxygen therapy     "1L just at night" (09/25/2014)  . OSA on CPAP 05/2012    severe OSA with AHI 118, desat nadir to 67%  . Diabetes mellitus (HCC)     "just when she was on TPN"  . Seizure (HCC) 2013    x 2 ; "very mild"; during hospitalization "while coming off vent"  . DJD (degenerative joint disease) of knee     bilat knee arthritis, had 3/3 Supartz injections [name])  . OCD (obsessive compulsive disorder)     PAST SURGICAL HISTORY: Past Surgical History  Procedure Laterality Date  . Back surgery    . Anterior cruciate ligament repair Left   . Flexible bronchoscopy  09/24/2011    Procedure: FLEXIBLE BRONCHOSCOPY;  Surgeon: Purcell Nails, MD;  Location: [facility] OR;  Service: Thoracic;  Laterality: N/A;  . Tracheostomy tube  placement  10/11/2011    Procedure: TRACHEOSTOMY;  Surgeon: Serena Colonel, MD;  Location: [facility] OR;  Service: ENT;  Laterality: N/A;  . Video assisted thoracoscopy (vats) /thorocotomy/radioactive seed implant Left 09/24/2011    VATS, (Dr. Barry Dienes)  . Lumbar disc surgery      ruptured disc    FAMILY HISTORY: Family History  Problem Relation Age of Onset  . Arthritis Mother   . Hypertension Father   . Diabetes Father   . Cancer Neg Hx   . Coronary artery disease Neg Hx   . Stroke Neg Hx     SOCIAL HISTORY: Social History   Social History  . Marital Status: Single    Spouse Name: N/A  . Number of Children: 0  . Years of Education: N/A   Occupational History  . Not on file.   Social History Main Topics  . Smoking status: Never Smoker   . Smokeless tobacco: Never Used  .  Alcohol Use: No  . Drug Use: No  . Sexual Activity: No   Other Topics Concern  . Not on file   Social History Narrative   Lives with mom and dad, dogs   Stays at daycare during the day   Activity: walking some   Diet: good water, fruits/vegetables daily     PHYSICAL EXAM  Filed Vitals:   12/11/14 0848  BP: 96/62  Pulse: 60  Resp: 16  Height: 5\' 2"  (1.575 m)  Weight: 227 lb 6.4 oz (103.148 kg)   Body mass index is 41.58 kg/(m^2). Generalized: Well developed, obese female in no acute distress with downs features  Head: normocephalic and atraumatic,.    Neck: Supple, no carotid bruits  Cardiac: Regular rate rhythm, no murmur  Neurological examination  Mentation: Alert . Follows all commands speech and language fluent but with childlike quality. Diminished attention, registration and recall. Cranial nerve II-XII: Pupils were equal round reactive to light extraocular movements with end gaze horizontal nystagmus , visual field were full on confrontational test. Facial sensation and strength were normal. hearing was intact to finger rubbing bilaterally. Uvula tongue midline. head turning and shoulder  shrug and were normal and symmetric.Tongue protrusion into cheek strength was normal.  Motor: normal bulk and tone, full strength in the BUE, BLE, fine finger movements normal, no pronator drift. No focal weakness  Sensory: normal and symmetric to light touch, pinprick, and vibration  Coordination: finger-nose-finger, heel-to-shin bilaterally, no dysmetria  Reflexes: Brachioradialis 2/2, biceps 2/2, triceps 2/2, patellar 2/2, Achilles 2/2, plantar responses were flexor bilaterally.  Gait and Station: Rising up from seated position without assistance, wide base stance, moderate stride, good arm swing, smooth turning, able to perform tiptoe, and heel walking without difficulty. Unsteady with tandem   DIAGNOSTIC DATA (LABS, IMAGING, TESTING) - I reviewed patient records, labs, notes, testing and imaging myself where available.  Lab Results  Component Value Date   WBC 5.1 09/27/2014   HGB 13.4 09/27/2014   HCT 40.3 09/27/2014   MCV 101.8* 09/27/2014   PLT 181 09/27/2014      Component Value Date/Time   NA 137 09/27/2014 0700   K 4.0 09/27/2014 0700   CL 105 09/27/2014 0700   CO2 26 09/27/2014 0700   GLUCOSE 89 09/27/2014 0700   BUN 12 09/27/2014 0700   CREATININE 0.94 09/27/2014 0700   CALCIUM 8.4* 09/27/2014 0700   PROT 7.1 06/09/2014 0804   ALBUMIN 3.4* 06/09/2014 0804   AST 20 06/09/2014 0804   ALT 24 06/09/2014 0804   ALKPHOS 52 06/09/2014 0804   BILITOT 0.3 06/09/2014 0804   GFRNONAA >60 09/27/2014 0700   GFRAA >60 09/27/2014 0700   Lab Results  Component Value Date   CHOL 141 06/09/2014   HDL 47.20 06/09/2014   LDLCALC 81 06/09/2014   TRIG 62.0 06/09/2014   CHOLHDL 3 06/09/2014   Lab Results  Component Value Date   HGBA1C 5.7 06/09/2014    Lab Results  Component Value Date   TSH 0.683 09/26/2014      ASSESSMENT AND PLAN  44 y.o. year old female  has a past medical history of Downs syndrome; Depression;  Seizure; OSA on CPAP (05/2012);  and Presenile  dementia, uncomplicated. here to followup. She has not had seizure activity in the last  Few years.  I had a long discussion with the patient and sister regarding her seizure disorder which appears to be stable and she is tolerating the current dose of Keppra 500  twice daily without side effects and we will continue it and refill it for a year. Also continue Namenda 10 mg twice daily which is also tolerating well and she has remained cognitively stable. We will refill this also for a year. Greater than 50% of time during this 25 minute visit was spent on counseling, development and explanation of plan of care and coordination of care. Return for follow-up in one year with Latrelle Dodrill, nurse practitioner call earlier if necessary.  Delia Heady, MD [facility] [address] [place], [place] [address] [phone]

## 2015-01-11 ENCOUNTER — Other Ambulatory Visit: Payer: Self-pay | Admitting: Family Medicine

## 2015-01-26 ENCOUNTER — Encounter: Payer: Commercial Managed Care - HMO | Admitting: Podiatry

## 2015-01-26 ENCOUNTER — Encounter: Payer: Self-pay | Admitting: Podiatry

## 2015-01-26 NOTE — Progress Notes (Signed)
This encounter was created in error - please disregard.

## 2015-04-05 ENCOUNTER — Other Ambulatory Visit: Payer: Self-pay | Admitting: Family Medicine

## 2015-04-05 DIAGNOSIS — Z1231 Encounter for screening mammogram for malignant neoplasm of breast: Secondary | ICD-10-CM

## 2015-04-19 ENCOUNTER — Encounter: Payer: Self-pay | Admitting: Primary Care

## 2015-04-19 ENCOUNTER — Ambulatory Visit: Payer: Commercial Managed Care - HMO | Admitting: Family Medicine

## 2015-04-19 ENCOUNTER — Ambulatory Visit (INDEPENDENT_AMBULATORY_CARE_PROVIDER_SITE_OTHER): Payer: Commercial Managed Care - HMO | Admitting: Primary Care

## 2015-04-19 VITALS — BP 110/62 | HR 58 | Temp 97.8°F | Ht 62.0 in | Wt 228.4 lb

## 2015-04-19 DIAGNOSIS — R05 Cough: Secondary | ICD-10-CM | POA: Diagnosis not present

## 2015-04-19 DIAGNOSIS — R059 Cough, unspecified: Secondary | ICD-10-CM

## 2015-04-19 MED ORDER — ALBUTEROL SULFATE (2.5 MG/3ML) 0.083% IN NEBU: 2.5000 mg | INHALATION_SOLUTION | Freq: Four times a day (QID) | RESPIRATORY_TRACT | Status: DC | PRN

## 2015-04-19 MED ORDER — BENZONATATE 200 MG PO CAPS: 200.0000 mg | ORAL_CAPSULE | Freq: Three times a day (TID) | ORAL | Status: DC | PRN

## 2015-04-19 NOTE — Patient Instructions (Signed)
You may take Benzonatate capsules for cough. Take 1 capsule by mouth three times daily as needed for cough.  Cough/Congestion: Try taking Mucinex DM. This will help loosen up the mucous in your chest. Ensure you take this medication with a full glass of water.  Nasal Congestion: Try using Flonase (fluticasone) nasal spray. Instill 2 sprays in each nostril once daily.   Increase consumption of water and rest. Please call me if she develops fevers of 101, starts coughing up green mucous, or if no improvement in 7 days.  It was a pleasure meeting you!  Upper Respiratory Infection, Adult Most upper respiratory infections (URIs) are a viral infection of the air passages leading to the lungs. A URI affects the nose, throat, and upper air passages. The most common type of URI is nasopharyngitis and is typically referred to as "the common cold." URIs run their course and usually go away on their own. Most of the time, a URI does not require medical attention, but sometimes a bacterial infection in the upper airways can follow a viral infection. This is called a secondary infection. Sinus and middle ear infections are common types of secondary upper respiratory infections. Bacterial pneumonia can also complicate a URI. A URI can worsen asthma and chronic obstructive pulmonary disease (COPD). Sometimes, these complications can require emergency medical care and may be life threatening.  CAUSES Almost all URIs are caused by viruses. A virus is a type of germ and can spread from one person to another.  RISKS FACTORS You may be at risk for a URI if:   You smoke.   You have chronic heart or lung disease.  You have a weakened defense (immune) system.   You are very young or very old.   You have nasal allergies or asthma.  You work in crowded or poorly ventilated areas.  You work in health care facilities or schools. SIGNS AND SYMPTOMS  Symptoms typically develop 2-3 days after you come in  contact with a cold virus. Most viral URIs last 7-10 days. However, viral URIs from the influenza virus (flu virus) can last 14-18 days and are typically more severe. Symptoms may include:   Runny or stuffy (congested) nose.   Sneezing.   Cough.   Sore throat.   Headache.   Fatigue.   Fever.   Loss of appetite.   Pain in your forehead, behind your eyes, and over your cheekbones (sinus pain).  Muscle aches.  DIAGNOSIS  Your health care provider may diagnose a URI by:  Physical exam.  Tests to check that your symptoms are not due to another condition such as:  Strep throat.  Sinusitis.  Pneumonia.  Asthma. TREATMENT  A URI goes away on its own with time. It cannot be cured with medicines, but medicines may be prescribed or recommended to relieve symptoms. Medicines may help:  Reduce your fever.  Reduce your cough.  Relieve nasal congestion. HOME CARE INSTRUCTIONS   Take medicines only as directed by your health care provider.   Gargle warm saltwater or take cough drops to comfort your throat as directed by your health care provider.  Use a warm mist humidifier or inhale steam from a shower to increase air moisture. This may make it easier to breathe.  Drink enough fluid to keep your urine clear or pale yellow.   Eat soups and other clear broths and maintain good nutrition.   Rest as needed.   Return to work when your temperature has returned to  normal or as your health care provider advises. You may need to stay home longer to avoid infecting others. You can also use a face mask and careful hand washing to prevent spread of the virus.  Increase the usage of your inhaler if you have asthma.   Do not use any tobacco products, including cigarettes, chewing tobacco, or electronic cigarettes. If you need help quitting, ask your health care provider. PREVENTION  The best way to protect yourself from getting a cold is to practice good hygiene.    Avoid oral or hand contact with people with cold symptoms.   Wash your hands often if contact occurs.  There is no clear evidence that vitamin C, vitamin E, echinacea, or exercise reduces the chance of developing a cold. However, it is always recommended to get plenty of rest, exercise, and practice good nutrition.  SEEK MEDICAL CARE IF:   You are getting worse rather than better.   Your symptoms are not controlled by medicine.   You have chills.  You have worsening shortness of breath.  You have brown or red mucus.  You have yellow or brown nasal discharge.  You have pain in your face, especially when you bend forward.  You have a fever.  You have swollen neck glands.  You have pain while swallowing.  You have white areas in the back of your throat. SEEK IMMEDIATE MEDICAL CARE IF:   You have severe or persistent:  Headache.  Ear pain.  Sinus pain.  Chest pain.  You have chronic lung disease and any of the following:  Wheezing.  Prolonged cough.  Coughing up blood.  A change in your usual mucus.  You have a stiff neck.  You have changes in your:  Vision.  Hearing.  Thinking.  Mood. MAKE SURE YOU:   Understand these instructions.  Will watch your condition.  Will get help right away if you are not doing well or get worse.   This information is not intended to replace advice given to you by your health care provider. Make sure you discuss any questions you have with your health care provider.   Document Released: 08/09/2000 Document Revised: 06/30/2014 Document Reviewed: 05/21/2013 Elsevier Interactive Patient Education [employer]

## 2015-04-19 NOTE — Progress Notes (Signed)
Pre visit review using our clinic review tool, if applicable. No additional management support is needed unless otherwise documented below in the visit note. 

## 2015-04-19 NOTE — Progress Notes (Signed)
Subjective:    Patient ID: [name], female    DOB: 08-03-1970, 45 y.o.   MRN: [id number]  HPI  Ms. Gary is a 45 year old female who presents today with a chief complaint of cough. She is with her father who is providing information for HPI.  She also reports chest congestion and nasal congestion. Her symptoms have been present since Saturday this past weekend. The majority of her cough began last night as she was unable to sleep. She's been using her albuterol nebulized solution with improvement and is needing a refill. Denies fevers, nausea, vomiting. She's taken Robitussin OTC without improvement. Denies sick contacts.   Review of Systems  Constitutional: Negative for fever and chills.  HENT: Positive for congestion and sore throat. Negative for ear pain, sinus pressure and sneezing.   Respiratory: Positive for cough and shortness of breath. Negative for wheezing.   Cardiovascular: Negative for chest pain.  Gastrointestinal: Negative for nausea.  Musculoskeletal: Negative for myalgias.       Past Medical History  Diagnosis Date  . Down syndrome   . Pneumonia     multiple pneumonias  . Empyema of left pleural space (HCC) 09/24/2011  . Depression     with OCD  . Hypothyroid   . Dementia due to another medical condition     Trisomy 21-related, sees [name] yearly, nl CT head 2010, did not tolerate aricept  . Sleep-related hypoventilation 09/23/2012  . Eczema   . 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 Presenile dementia, uncomplicated   . On home oxygen therapy     "1L just at night" (09/25/2014)  . OSA on CPAP 05/2012    severe OSA with AHI 118, desat nadir to 67%  . Diabetes mellitus (HCC)     "just when she was on TPN"  . Seizure (HCC) 2013    x 2 ; "very mild"; during hospitalization "while coming off vent"  . DJD (degenerative joint disease) of knee     bilat knee arthritis, had 3/3 Supartz injections [name])  . OCD (obsessive compulsive disorder)     Social History   Social  History  . Marital Status: Single    Spouse Name: N/A  . Number of Children: 0  . Years of Education: N/A   Occupational History  . Not on file.   Social History Main Topics  . Smoking status: Never Smoker   . Smokeless tobacco: Never Used  . Alcohol Use: No  . Drug Use: No  . Sexual Activity: No   Other Topics Concern  . Not on file   Social History Narrative   Lives with mom and dad, dogs   Stays at daycare during the day   Activity: walking some   Diet: good water, fruits/vegetables daily    Past Surgical History  Procedure Laterality Date  . Back surgery    . Anterior cruciate ligament repair Left   . Flexible bronchoscopy  09/24/2011    Procedure: FLEXIBLE BRONCHOSCOPY;  Surgeon: Purcell Nails, MD;  Location: [facility] OR;  Service: Thoracic;  Laterality: N/A;  . Tracheostomy tube placement  10/11/2011    Procedure: TRACHEOSTOMY;  Surgeon: Serena Colonel, MD;  Location: [facility] OR;  Service: ENT;  Laterality: N/A;  . Video assisted thoracoscopy (vats) /thorocotomy/radioactive seed implant Left 09/24/2011    VATS, (Dr. Barry Dienes)  . Lumbar disc surgery      ruptured disc    Family History  Problem Relation Age of Onset  . Arthritis Mother   .  Hypertension Father   . Diabetes Father   . Cancer Neg Hx   . Coronary artery disease Neg Hx   . Stroke Neg Hx     No Known Allergies  Current Outpatient Prescriptions on File Prior to Visit  Medication Sig Dispense Refill  . clindamycin (CLEOCIN T) 1 % external solution     . clobetasol cream (TEMOVATE) 0.05 % APPLY TO AFFECTED AREA TWO TIMES A DAY. (Patient taking differently: APPLY TO AFFECTED AREA TWO TIMES A DAY AS NEEDED FOR AFFECTED AREA) 60 g 0  . levETIRAcetam (KEPPRA) 500 MG tablet TAKE 1 TABLET (500 MG TOTAL) BY MOUTH EVERY 12 (TWELVE) HOURS. 180 tablet 3  . levothyroxine (SYNTHROID, LEVOTHROID) 125 MCG tablet TAKE 1 TABLET BY MOUTH DAILY BEFORE BREAKFAST. 90 tablet 3  . memantine (NAMENDA) 10 MG tablet Take 1 tablet (10 mg  total) by mouth 2 (two) times daily. 60 tablet 11  . Multiple Vitamins-Minerals (WOMENS MULTI PO) Take 1 tablet by mouth daily.     . mupirocin ointment (BACTROBAN) 2 % Apply 1 application topically 2 (two) times daily. 22 g 0  . pantoprazole (PROTONIX) 40 MG tablet TAKE 1 TABLET BY MOUTH DAILY. 30 tablet 11   No current facility-administered medications on file prior to visit.    BP 110/62 mmHg  Pulse 58  Temp(Src) 97.8 F (36.6 C) (Oral)  Ht  (1.575 m)  Wt 228 lb 6.4 oz (103.602 kg)  BMI 41.76 kg/m2  SpO2 98%  LMP 04/17/2015    Objective:   Physical Exam  Constitutional: She appears well-nourished.  HENT:  Right Ear: External ear normal.  Left Ear: External ear normal.  Nose: Right sinus exhibits no maxillary sinus tenderness and no frontal sinus tenderness. Left sinus exhibits no maxillary sinus tenderness and no frontal sinus tenderness.  Mouth/Throat: Oropharynx is clear and moist.  Eyes: Conjunctivae are normal.  Neck: Neck supple.  Cardiovascular: Normal rate and regular rhythm.   Pulmonary/Chest: Effort normal and breath sounds normal. She has no wheezes. She has no rales.  Skin: Skin is warm and dry.          Assessment & Plan:  URI:  Cough, nasal congestion, chest congestion x 2 days. Worse last night. Symptoms improved with albuterol neb treatments. Exam with clear lungs and overall unremarkable exam. Dry cough present. Suspect viral URI at this point and will treat with supportive measures. RX for tessalon pearls, Mucinex, Flonase. Refill for neb treatments provided. Increase consumption of fluids and rest. Return precautions provided to her father, he verbalized understanding.

## 2015-04-20 ENCOUNTER — Telehealth: Payer: Self-pay | Admitting: Family Medicine

## 2015-04-20 DIAGNOSIS — Q909 Down syndrome, unspecified: Secondary | ICD-10-CM

## 2015-04-20 DIAGNOSIS — L609 Nail disorder, unspecified: Secondary | ICD-10-CM

## 2015-04-20 NOTE — Telephone Encounter (Signed)
Referral placed.

## 2015-04-20 NOTE — Telephone Encounter (Signed)
Please enter podiatry referral for pt to see Dr. Al Corpus at [facility]. She is scheduled 04/27/15 and requires [facility] authorization. She needs toe nail trim. Please advise.

## 2015-04-26 ENCOUNTER — Other Ambulatory Visit: Payer: Self-pay

## 2015-04-26 DIAGNOSIS — Z1231 Encounter for screening mammogram for malignant neoplasm of breast: Secondary | ICD-10-CM

## 2015-04-27 ENCOUNTER — Encounter: Payer: Self-pay | Admitting: Podiatry

## 2015-04-27 ENCOUNTER — Ambulatory Visit (INDEPENDENT_AMBULATORY_CARE_PROVIDER_SITE_OTHER): Payer: Commercial Managed Care - HMO | Admitting: Podiatry

## 2015-04-27 DIAGNOSIS — B351 Tinea unguium: Secondary | ICD-10-CM

## 2015-04-27 DIAGNOSIS — M79676 Pain in unspecified toe(s): Secondary | ICD-10-CM | POA: Diagnosis not present

## 2015-04-27 NOTE — Progress Notes (Signed)
She presents today with a chief complaint of painful elongated toenails 1 through 5 bilateral.  Objective: Pulses are strongly palpable bilateral. Toenails are thick yellow dystrophic onychomycotic and painful on palpation.  Assessment: Pain in limb secondary to onychomycosis 1 through 5 bilateral.  Plan: Discussed etiology pathology conservative versus surgical therapy debrided nails 1 through 5 bilateral cover service secondary to pain.

## 2015-05-07 ENCOUNTER — Ambulatory Visit
Admission: RE | Admit: 2015-05-07 | Discharge: 2015-05-07 | Disposition: A | Discharge status: Home / Self Care | Payer: Commercial Managed Care - HMO | Source: Ambulatory Visit | Attending: Family Medicine | Admitting: Family Medicine

## 2015-05-07 ENCOUNTER — Encounter: Payer: Self-pay | Admitting: *Deleted

## 2015-05-07 ENCOUNTER — Telehealth: Payer: Self-pay | Admitting: Family Medicine

## 2015-05-07 DIAGNOSIS — Z1231 Encounter for screening mammogram for malignant neoplasm of breast: Secondary | ICD-10-CM

## 2015-05-07 LAB — HM MAMMOGRAPHY: HM Mammogram: NORMAL

## 2015-05-07 NOTE — Telephone Encounter (Signed)
[name] form to be filled out In dr g IN BOX

## 2015-05-08 NOTE — Telephone Encounter (Signed)
Filled and placed in kim's box. 

## 2015-05-11 NOTE — Telephone Encounter (Signed)
Form mailed and copies made for [employer] and Carrie.

## 2015-06-23 ENCOUNTER — Encounter: Payer: Self-pay | Admitting: Pulmonary Disease

## 2015-06-23 ENCOUNTER — Ambulatory Visit (INDEPENDENT_AMBULATORY_CARE_PROVIDER_SITE_OTHER): Payer: Commercial Managed Care - HMO | Admitting: Pulmonary Disease

## 2015-06-23 VITALS — BP 100/60 | HR 80 | Ht 59.0 in | Wt 227.0 lb

## 2015-06-23 DIAGNOSIS — G4733 Obstructive sleep apnea (adult) (pediatric): Secondary | ICD-10-CM | POA: Diagnosis not present

## 2015-06-23 DIAGNOSIS — E662 Morbid (severe) obesity with alveolar hypoventilation: Secondary | ICD-10-CM | POA: Diagnosis not present

## 2015-06-23 DIAGNOSIS — J9611 Chronic respiratory failure with hypoxia: Secondary | ICD-10-CM | POA: Diagnosis not present

## 2015-06-23 DIAGNOSIS — Z9989 Dependence on other enabling machines and devices: Principal | ICD-10-CM

## 2015-06-23 NOTE — Patient Instructions (Signed)
Will call with result of CPAP report  Follow up in 1 year 

## 2015-06-23 NOTE — Progress Notes (Signed)
Current Outpatient Prescriptions on File Prior to Visit  Medication Sig  . albuterol (PROVENTIL) (2.5 MG/3ML) 0.083% nebulizer solution Take 3 mLs (2.5 mg total) by nebulization every 6 (six) hours as needed for wheezing or shortness of breath.  . clobetasol cream (TEMOVATE) 0.05 % APPLY TO AFFECTED AREA TWO TIMES A DAY. (Patient taking differently: APPLY TO AFFECTED AREA TWO TIMES A DAY AS NEEDED FOR AFFECTED AREA)  . levETIRAcetam (KEPPRA) 500 MG tablet TAKE 1 TABLET (500 MG TOTAL) BY MOUTH EVERY 12 (TWELVE) HOURS.  Marland Kitchen. levothyroxine (SYNTHROID, LEVOTHROID) 125 MCG tablet TAKE 1 TABLET BY MOUTH DAILY BEFORE BREAKFAST.  . memantine (NAMENDA) 10 MG tablet Take 1 tablet (10 mg total) by mouth 2 (two) times daily.  . Multiple Vitamins-Minerals (WOMENS MULTI PO) Take 1 tablet by mouth daily.   . mupirocin ointment (BACTROBAN) 2 % Apply 1 application topically 2 (two) times daily. (Patient not taking: Reported on 06/23/2015)   No current facility-administered medications on file prior to visit.    Chief Complaint  Patient presents with  . Follow-up    Wears CPAP nightly with 1 Liter O2 . Denies problems with mask or pressure. Needs mask, filters, hosing, humidifier and headgear. Needs a new SD card for machine. DME: [facility]    Tests PSG 06/02/12 >> AHI 118.3, SpO2 low 67% ONO with CPAP and 1 liter 07/18/12 >> Test time 7 hrs 44 min.  Basal SpO2 96%, SpO2 low 81%.  Spent 0.7% of time with SpO2 < 88%. Echo 09/27/14 >> EF 55 to 60%  Past medical history Down syndrome, Depression, Hypothyroidism, Seizure, Eczema  PSHx, Medications, Allergies, FHx, SHx reviewed.  Vital signs BP 100/60 mmHg  Pulse 80  Ht 4\' 11"  (1.499 m)  Wt 227 lb (102.967 kg)  BMI 45.82 kg/m2  SpO2 98%   History of Present Illness: [name] is a 45 y.o. female with severe OSA.  She has prior hx of pneumonia, empyema, and respiratory failure requiring tracheostomy.  She is doing well with CPAP.  She has no issues with her  mask fit.  She uses CPAP every night.  She is using 1 liter oxygen with CPAP.  She is not falling asleep during the day.  No problems with allergies this season.  No other changes in health since last visit.  Interview conducted with assistance of pt's mother.  Physical Exam:  General - Down's body habitus ENT - No sinus tenderness, no oral exudate, 2+ tonsils, no LAN, enlarged tongue, scar over anterior neck, wears glasses Cardiac - s1s2 regular, no murmur Chest - No wheeze/rales Back - No focal tenderness Abd - Soft, non-tender Ext - No edema Neuro - Normal strength Skin - dry, flaking around nares and on dorsum of hands Psych - Normal mood, and behavior   Assessment/Plan:  Obstructive sleep apnea. - continue auto CPAP - will call with results of CPAP download  Chronic hypoxic respiratory failure 2nd to obesity hypoventilation syndrome. - continue 1 liter oxygen at night with CPAP   Patient Instructions  Will call with result of CPAP report  Follow up in 1 year    Coralyn HellingVineet Denina Rieger, MD [facility] Pager:  [phone] 06/23/2015, 4:12 PM

## 2015-07-14 ENCOUNTER — Ambulatory Visit: Payer: Commercial Managed Care - HMO

## 2015-07-21 ENCOUNTER — Encounter: Payer: Commercial Managed Care - HMO | Admitting: Family Medicine

## 2015-07-23 ENCOUNTER — Telehealth: Payer: Self-pay | Admitting: Pulmonary Disease

## 2015-07-23 DIAGNOSIS — G4733 Obstructive sleep apnea (adult) (pediatric): Secondary | ICD-10-CM

## 2015-07-23 DIAGNOSIS — Z9989 Dependence on other enabling machines and devices: Principal | ICD-10-CM

## 2015-07-23 NOTE — Telephone Encounter (Signed)
Auto CPAP 06/15/15 to 07/14/15 >> used on 30 of 30 nights with average 8 hrs and 19 min.  Average AHI is 2.1 with median CPAP 10 cm H2O and 95 th percentile CPAP 12 cm H20.   Will have my nurse inform pt that CPAP report looks good.  No change to current set up.

## 2015-07-30 NOTE — Telephone Encounter (Signed)
LM x 1 

## 2015-07-31 ENCOUNTER — Other Ambulatory Visit: Payer: Self-pay | Admitting: Family Medicine

## 2015-08-02 ENCOUNTER — Telehealth: Payer: Self-pay | Admitting: Pulmonary Disease

## 2015-08-02 NOTE — Telephone Encounter (Signed)
I don't see any order for me to sign.

## 2015-08-02 NOTE — Telephone Encounter (Signed)
Spoke with [employer]. An order was placed this morning for DME services. This order can't be processed until VS electronically signs it.  VS - please sign this order. Thanks.

## 2015-08-02 NOTE — Telephone Encounter (Signed)
VS has already signed this order. Nothing further was needed.

## 2015-08-02 NOTE — Telephone Encounter (Signed)
Patient mom called and I advised of note below that Cpap report was good and no changes necessary. Patient mom voiced understanding but states that her daughter needs new cpap supplies and it was to be ordered last month.-prm

## 2015-08-02 NOTE — Telephone Encounter (Signed)
Called spoke with pt's mother. She states that the patient is in need of CPAP supplies. I explained to her that I would placed the order today. She voiced understanding and had no further questions. Order placed. Nothing further needed.

## 2015-08-03 ENCOUNTER — Ambulatory Visit (INDEPENDENT_AMBULATORY_CARE_PROVIDER_SITE_OTHER): Payer: Commercial Managed Care - HMO | Admitting: Family Medicine

## 2015-08-03 ENCOUNTER — Encounter: Payer: Self-pay | Admitting: Family Medicine

## 2015-08-03 VITALS — BP 100/64 | HR 65 | Temp 97.9°F | Wt 228.4 lb

## 2015-08-03 DIAGNOSIS — M79605 Pain in left leg: Secondary | ICD-10-CM

## 2015-08-03 DIAGNOSIS — M170 Bilateral primary osteoarthritis of knee: Secondary | ICD-10-CM | POA: Diagnosis not present

## 2015-08-03 DIAGNOSIS — M7989 Other specified soft tissue disorders: Secondary | ICD-10-CM | POA: Diagnosis not present

## 2015-08-03 DIAGNOSIS — M79662 Pain in left lower leg: Secondary | ICD-10-CM | POA: Insufficient documentation

## 2015-08-03 NOTE — Assessment & Plan Note (Signed)
Refer back to [name] per mother's request. Prior injections very effective.

## 2015-08-03 NOTE — Progress Notes (Signed)
BP 100/64 mmHg  Pulse 65  Temp(Src) 97.9 F (36.6 C)  Wt 228 lb 6.4 oz (103.602 kg)  SpO2 96%   CC: ankle and knee pain  Subjective:    Patient ID: [name], female    DOB: 1971-01-02, 45 y.o.   MRN: [id number]  HPI: Christine Galasammy M Maciejewski is a 45 y.o. female presenting on 08/03/2015 for Knee Pain and Ankle Pain   Presents with mom today. I last saw patient 09/2015.  1-2 wk h/o L>R leg swelling and soreness, along with discoloration anterior lower leg.   No new injury/falls, no redness or warmth. No fevers.   Also - has had longstanding knee OA s/p visosupplementation to L knee 2 yrs ago. Knee starting to bother her more, requests referral back to ortho for re evaluation.   Relevant past medical, surgical, family and social history reviewed and updated as indicated. Interim medical history since our last visit reviewed. Allergies and medications reviewed and updated. Current Outpatient Prescriptions on File Prior to Visit  Medication Sig  . albuterol (PROVENTIL) (2.5 MG/3ML) 0.083% nebulizer solution Take 3 mLs (2.5 mg total) by nebulization every 6 (six) hours as needed for wheezing or shortness of breath.  . clobetasol cream (TEMOVATE) 0.05 % APPLY TO AFFECTED AREA TWO TIMES A DAY.  . levETIRAcetam (KEPPRA) 500 MG tablet TAKE 1 TABLET (500 MG TOTAL) BY MOUTH EVERY 12 (TWELVE) HOURS.  Marland Kitchen. levothyroxine (SYNTHROID, LEVOTHROID) 125 MCG tablet TAKE 1 TABLET BY MOUTH DAILY BEFORE BREAKFAST.  . memantine (NAMENDA) 10 MG tablet Take 1 tablet (10 mg total) by mouth 2 (two) times daily.  . Multiple Vitamins-Minerals (WOMENS MULTI PO) Take 1 tablet by mouth daily.   . mupirocin ointment (BACTROBAN) 2 % Apply 1 application topically 2 (two) times daily.   No current facility-administered medications on file prior to visit.    Review of Systems Per HPI unless specifically indicated in ROS section     Objective:    BP 100/64 mmHg  Pulse 65  Temp(Src) 97.9 F (36.6 C)  Wt 228 lb 6.4 oz  (103.602 kg)  SpO2 96%  Wt Readings from Last 3 Encounters:  08/03/15 228 lb 6.4 oz (103.602 kg)  06/23/15 227 lb (102.967 kg)  04/19/15 228 lb 6.4 oz (103.602 kg)    Physical Exam  Constitutional: She appears well-developed and well-nourished. No distress.  HENT:  Mouth/Throat: Oropharynx is clear and moist. No oropharyngeal exudate.  Cardiovascular: Normal rate, regular rhythm, normal heart sounds and intact distal pulses.   No murmur heard. Pulmonary/Chest: Effort normal and breath sounds normal. No respiratory distress. She has no wheezes. She has no rales.  Musculoskeletal: She exhibits edema (nonpitting).  2+ DP bilaterally Mild discomfort to percussion at L lateral malleolus, not reproducible. Tender to palpation of L lateral ankle ligament.  No pain with squeezing of lower leg or ankles. Neg calcaneal squeeze test.   Skin: Skin is warm and dry. No rash noted. No erythema.  Mild hemosiderin deposition isolated to anterior left lower leg  Psychiatric: She has a normal mood and affect.  Nursing note and vitals reviewed.  Results for orders placed or performed in visit on 05/07/15  [facility] MAMMOGRAPHY  Result Value Ref Range   [facility] Mammogram Normal birads 1-Repeat 1 year       Assessment & Plan:  Requests labs for GI distress - consider gluten sensitivity vs IBS.  Problem List Items Addressed This Visit    Knee osteoarthritis    Refer  back to Delbert Harness per mother's request. Prior injections very effective.       Relevant Orders   Ambulatory referral to Orthopedic Surgery   Pain and swelling of left lower leg - Primary    Brisk DP pulses today. No signs of DVT or cellulitis. Anticipate skin changes related to chronic venous insufficiency of left leg.  She also has discomfort at lateral left ankle - possible recent ankle sprain, healing as expected.  Discussed elevation, rest, avoid sodium, and increase in water for now. Continue to monitor symptoms for now. Consider  compression stocking if desired.          Follow up plan: Return if symptoms worsen or fail to improve.  Eustaquio Boyden, MD

## 2015-08-03 NOTE — Patient Instructions (Addendum)
We will refer you back to Christine HarnessMurphy Wainer for left knee. I think swelling and skin change is coming from early venous insufficiency. Treat with elevation of leg, avoiding salt, staying well hydrated, and consider compression stockings.

## 2015-08-03 NOTE — Assessment & Plan Note (Addendum)
Brisk DP pulses today. No signs of DVT or cellulitis. Anticipate skin changes related to chronic venous insufficiency of left leg.  She also has discomfort at lateral left ankle - possible recent ankle sprain, healing as expected.  Discussed elevation, rest, avoid sodium, and increase in water for now. Continue to monitor symptoms for now. Consider compression stocking if desired.

## 2015-08-03 NOTE — Progress Notes (Signed)
Pre visit review using our clinic review tool, if applicable. No additional management support is needed unless otherwise documented below in the visit note. 

## 2015-09-16 ENCOUNTER — Ambulatory Visit: Payer: Commercial Managed Care - HMO | Admitting: Podiatry

## 2015-09-20 DIAGNOSIS — H30893 Other chorioretinal inflammations, bilateral: Secondary | ICD-10-CM | POA: Insufficient documentation

## 2015-09-27 ENCOUNTER — Encounter: Payer: Self-pay | Admitting: Family Medicine

## 2015-09-27 ENCOUNTER — Ambulatory Visit (INDEPENDENT_AMBULATORY_CARE_PROVIDER_SITE_OTHER): Payer: Commercial Managed Care - HMO | Admitting: Family Medicine

## 2015-09-27 VITALS — BP 128/84 | HR 64 | Temp 98.2°F | Ht <= 58 in | Wt 219.5 lb

## 2015-09-27 DIAGNOSIS — R7303 Prediabetes: Secondary | ICD-10-CM

## 2015-09-27 DIAGNOSIS — M170 Bilateral primary osteoarthritis of knee: Secondary | ICD-10-CM

## 2015-09-27 DIAGNOSIS — Z7189 Other specified counseling: Secondary | ICD-10-CM | POA: Insufficient documentation

## 2015-09-27 DIAGNOSIS — Z6841 Body Mass Index (BMI) 40.0 and over, adult: Secondary | ICD-10-CM

## 2015-09-27 DIAGNOSIS — L301 Dyshidrosis [pompholyx]: Secondary | ICD-10-CM

## 2015-09-27 DIAGNOSIS — D7589 Other specified diseases of blood and blood-forming organs: Secondary | ICD-10-CM | POA: Diagnosis not present

## 2015-09-27 DIAGNOSIS — F331 Major depressive disorder, recurrent, moderate: Secondary | ICD-10-CM

## 2015-09-27 DIAGNOSIS — E039 Hypothyroidism, unspecified: Secondary | ICD-10-CM | POA: Diagnosis not present

## 2015-09-27 DIAGNOSIS — R1030 Lower abdominal pain, unspecified: Secondary | ICD-10-CM | POA: Diagnosis not present

## 2015-09-27 DIAGNOSIS — Q909 Down syndrome, unspecified: Secondary | ICD-10-CM

## 2015-09-27 DIAGNOSIS — F0390 Unspecified dementia without behavioral disturbance: Secondary | ICD-10-CM

## 2015-09-27 DIAGNOSIS — R569 Unspecified convulsions: Secondary | ICD-10-CM

## 2015-09-27 DIAGNOSIS — Z0001 Encounter for general adult medical examination with abnormal findings: Secondary | ICD-10-CM

## 2015-09-27 DIAGNOSIS — H918X3 Other specified hearing loss, bilateral: Secondary | ICD-10-CM

## 2015-09-27 DIAGNOSIS — H6123 Impacted cerumen, bilateral: Secondary | ICD-10-CM

## 2015-09-27 DIAGNOSIS — G4733 Obstructive sleep apnea (adult) (pediatric): Secondary | ICD-10-CM

## 2015-09-27 DIAGNOSIS — H6120 Impacted cerumen, unspecified ear: Secondary | ICD-10-CM | POA: Insufficient documentation

## 2015-09-27 DIAGNOSIS — Z0000 Encounter for general adult medical examination without abnormal findings: Secondary | ICD-10-CM

## 2015-09-27 DIAGNOSIS — Z9989 Dependence on other enabling machines and devices: Secondary | ICD-10-CM

## 2015-09-27 LAB — CBC WITH DIFFERENTIAL/PLATELET
BASOS PCT: 0.9 % (ref 0.0–3.0)
Basophils Absolute: 0 10*3/uL (ref 0.0–0.1)
EOS PCT: 3.2 % (ref 0.0–5.0)
Eosinophils Absolute: 0.2 10*3/uL (ref 0.0–0.7)
HCT: 41.2 % (ref 36.0–46.0)
Hemoglobin: 13.4 g/dL (ref 12.0–15.0)
LYMPHS ABS: 1 10*3/uL (ref 0.7–4.0)
Lymphocytes Relative: 19.2 % (ref 12.0–46.0)
MCHC: 32.6 g/dL (ref 30.0–36.0)
MCV: 101.9 fl — AB (ref 78.0–100.0)
MONO ABS: 0.5 10*3/uL (ref 0.1–1.0)
MONOS PCT: 8.6 % (ref 3.0–12.0)
NEUTROS ABS: 3.7 10*3/uL (ref 1.4–7.7)
NEUTROS PCT: 68.1 % (ref 43.0–77.0)
PLATELETS: 227 10*3/uL (ref 150.0–400.0)
RBC: 4.04 Mil/uL (ref 3.87–5.11)
RDW: 15 % (ref 11.5–15.5)
WBC: 5.4 10*3/uL (ref 4.0–10.5)

## 2015-09-27 LAB — HEMOGLOBIN A1C: HEMOGLOBIN A1C: 5.7 % (ref 4.6–6.5)

## 2015-09-27 LAB — TSH: TSH: 0.44 u[IU]/mL (ref 0.35–4.50)

## 2015-09-27 LAB — COMPREHENSIVE METABOLIC PANEL
ALT: 42 U/L — AB (ref 0–35)
AST: 24 U/L (ref 0–37)
Albumin: 3.5 g/dL (ref 3.5–5.2)
Alkaline Phosphatase: 60 U/L (ref 39–117)
BILIRUBIN TOTAL: 0.3 mg/dL (ref 0.2–1.2)
BUN: 16 mg/dL (ref 6–23)
CALCIUM: 9 mg/dL (ref 8.4–10.5)
CO2: 30 meq/L (ref 19–32)
CREATININE: 1.02 mg/dL (ref 0.40–1.20)
Chloride: 102 mEq/L (ref 96–112)
GFR: 62.27 mL/min (ref >60.00)
GLUCOSE: 95 mg/dL (ref 70–99)
Potassium: 4.2 mEq/L (ref 3.5–5.1)
SODIUM: 137 meq/L (ref 135–145)
Total Protein: 7.3 g/dL (ref 6.0–8.3)

## 2015-09-27 LAB — VITAMIN D 25 HYDROXY (VIT D DEFICIENCY, FRACTURES): VITD: 37.8 ng/mL (ref 30.00–100.00)

## 2015-09-27 LAB — LIPASE: LIPASE: 18 U/L (ref 11.0–59.0)

## 2015-09-27 LAB — VITAMIN B12: Vitamin B-12: 429 pg/mL (ref 211–911)

## 2015-09-27 MED ORDER — SERTRALINE HCL 25 MG PO TABS: 25.0000 mg | ORAL_TABLET | Freq: Every day | ORAL | 11 refills | Status: DC

## 2015-09-27 NOTE — Assessment & Plan Note (Signed)

## 2015-09-27 NOTE — Assessment & Plan Note (Signed)
Appreciate ortho care. Has f/u appt this afternoon with Dr Dion Saucier.

## 2015-09-27 NOTE — Assessment & Plan Note (Addendum)
Ongoing lower abdominal pain over last 1 year. Overall benign exam today. She also has had normal [place].  Check UA, labs (CBC, CMP, lipase). ?IBS as improved after bowel movement - suggested start probiotic daily, and if ongoing pain despite this would consider trial of levsin or bentyl.

## 2015-09-27 NOTE — Progress Notes (Signed)
Pre visit review using our clinic review tool, if applicable. No additional management support is needed unless otherwise documented below in the visit note. 

## 2015-09-27 NOTE — Assessment & Plan Note (Signed)
Stable on keppra, followed by neurology.

## 2015-09-27 NOTE — Assessment & Plan Note (Signed)
Appreciate pulm care. Compliant with CPAP and 1L nocturnal O2

## 2015-09-27 NOTE — Assessment & Plan Note (Addendum)
Failed hearing screen today. Previously has tolerated irrigation in office, however mother requests referral to ENT today.

## 2015-09-27 NOTE — Patient Instructions (Addendum)
Taper off protonix - every other day for 1-2 weeks then stop.  Take daily probiotic (philips colon [facility]) for 2 weeks and update me with effect.  Labs and urinalysis today.  Start sertraline '25mg'$  daily for 2-3 weeks with option to increase to '50mg'$  daily.  We will refer you to ENT for wax in ears.  Health Maintenance, Female Adopting a healthy lifestyle and getting preventive care can go a long way to promote health and wellness. Talk with your health care provider about what schedule of regular examinations is right for you. This is a good chance for you to check in with your provider about disease prevention and staying healthy. In between checkups, there are plenty of things you can do on your own. Experts have done a lot of research about which lifestyle changes and preventive measures are most likely to keep you healthy. Ask your health care provider for more information. WEIGHT AND DIET  Eat a healthy diet  Be sure to include plenty of vegetables, fruits, low-fat dairy products, and lean protein.  Do not eat a lot of foods high in solid fats, added sugars, or salt.  Get regular exercise. This is one of the most important things you can do for your health.  Most adults should exercise for at least 150 minutes each week. The exercise should increase your heart rate and make you sweat (moderate-intensity exercise).  Most adults should also do strengthening exercises at least twice a week. This is in addition to the moderate-intensity exercise.  Maintain a healthy weight  Body mass index (BMI) is a measurement that can be used to identify possible weight problems. It estimates body fat based on height and weight. Your health care provider can help determine your BMI and help you achieve or maintain a healthy weight.  For females 8 years of age and older:   A BMI below 18.5 is considered underweight.  A BMI of 18.5 to 24.9 is normal.  A BMI of 25 to 29.9 is considered  overweight.  A BMI of 30 and above is considered obese.  Watch levels of cholesterol and blood lipids  You should start having your blood tested for lipids and cholesterol at 45 years of age, then have this test every 5 years.  You may need to have your cholesterol levels checked more often if:  Your lipid or cholesterol levels are high.  You are older than 45 years of age.  You are at high risk for heart disease.  CANCER SCREENING   Lung Cancer  Lung cancer screening is recommended for adults 20-43 years old who are at high risk for lung cancer because of a history of smoking.  A yearly low-dose CT scan of the lungs is recommended for people who:  Currently smoke.  Have quit within the past 15 years.  Have at least a 30-pack-year history of smoking. A pack year is smoking an average of one pack of cigarettes a day for 1 year.  Yearly screening should continue until it has been 15 years since you quit.  Yearly screening should stop if you develop a health problem that would prevent you from having lung cancer treatment.  Breast Cancer  Practice breast self-awareness. This means understanding how your breasts normally appear and feel.  It also means doing regular breast self-exams. Let your health care provider know about any changes, no matter how small.  If you are in your 20s or 30s, you should have a clinical breast exam (  CBE) by a health care provider every 1-3 years as part of a regular health exam.  If you are 69 or older, have a CBE every year. Also consider having a breast X-ray (mammogram) every year.  If you have a family history of breast cancer, talk to your health care provider about genetic screening.  If you are at high risk for breast cancer, talk to your health care provider about having an MRI and a mammogram every year.  Breast cancer gene (BRCA) assessment is recommended for women who have family members with BRCA-related cancers. BRCA-related  cancers include:  Breast.  Ovarian.  Tubal.  Peritoneal cancers.  Results of the assessment will determine the need for genetic counseling and BRCA1 and BRCA2 testing. Cervical Cancer Your health care provider may recommend that you be screened regularly for cancer of the pelvic organs (ovaries, uterus, and vagina). This screening involves a pelvic examination, including checking for microscopic changes to the surface of your cervix (Pap test). You may be encouraged to have this screening done every 3 years, beginning at age 32.  For women ages 38-65, health care providers may recommend pelvic exams and Pap testing every 3 years, or they may recommend the Pap and pelvic exam, combined with testing for human papilloma virus (HPV), every 5 years. Some types of HPV increase your risk of cervical cancer. Testing for HPV may also be done on women of any age with unclear Pap test results.  Other health care providers may not recommend any screening for nonpregnant women who are considered low risk for pelvic cancer and who do not have symptoms. Ask your health care provider if a screening pelvic exam is right for you.  If you have had past treatment for cervical cancer or a condition that could lead to cancer, you need Pap tests and screening for cancer for at least 20 years after your treatment. If Pap tests have been discontinued, your risk factors (such as having a new sexual partner) need to be reassessed to determine if screening should resume. Some women have medical problems that increase the chance of getting cervical cancer. In these cases, your health care provider may recommend more frequent screening and Pap tests. Colorectal Cancer  This type of cancer can be detected and often prevented.  Routine colorectal cancer screening usually begins at 45 years of age and continues through 45 years of age.  Your health care provider may recommend screening at an earlier age if you have risk  factors for colon cancer.  Your health care provider may also recommend using home test kits to check for hidden blood in the stool.  A small camera at the end of a tube can be used to examine your colon directly (sigmoidoscopy or colonoscopy). This is done to check for the earliest forms of colorectal cancer.  Routine screening usually begins at age 83.  Direct examination of the colon should be repeated every 5-10 years through 45 years of age. However, you may need to be screened more often if early forms of precancerous polyps or small growths are found. Skin Cancer  Check your skin from head to toe regularly.  Tell your health care provider about any new moles or changes in moles, especially if there is a change in a mole's shape or color.  Also tell your health care provider if you have a mole that is larger than the size of a pencil eraser.  Always use sunscreen. Apply sunscreen liberally and repeatedly throughout  the day.  Protect yourself by wearing long sleeves, pants, a wide-brimmed hat, and sunglasses whenever you are outside. HEART DISEASE, DIABETES, AND HIGH BLOOD PRESSURE   High blood pressure causes heart disease and increases the risk of stroke. High blood pressure is more likely to develop in:  People who have blood pressure in the high end of the normal range (130-139/85-89 mm Hg).  People who are overweight or obese.  People who are African American.  If you are 104-76 years of age, have your blood pressure checked every 3-5 years. If you are 61 years of age or older, have your blood pressure checked every year. You should have your blood pressure measured twice--once when you are at a hospital or clinic, and once when you are not at a hospital or clinic. Record the average of the two measurements. To check your blood pressure when you are not at a hospital or clinic, you can use:  An automated blood pressure machine at a pharmacy.  A home blood pressure  monitor.  If you are between 39 years and 63 years old, ask your health care provider if you should take aspirin to prevent strokes.  Have regular diabetes screenings. This involves taking a blood sample to check your fasting blood sugar level.  If you are at a normal weight and have a low risk for diabetes, have this test once every three years after 45 years of age.  If you are overweight and have a high risk for diabetes, consider being tested at a younger age or more often. PREVENTING INFECTION  Hepatitis B  If you have a higher risk for hepatitis B, you should be screened for this virus. You are considered at high risk for hepatitis B if:  You were born in a country where hepatitis B is common. Ask your health care provider which countries are considered high risk.  Your parents were born in a high-risk country, and you have not been immunized against hepatitis B (hepatitis B vaccine).  You have HIV or AIDS.  You use needles to inject street drugs.  You live with someone who has hepatitis B.  You have had sex with someone who has hepatitis B.  You get hemodialysis treatment.  You take certain medicines for conditions, including cancer, organ transplantation, and autoimmune conditions. Hepatitis C  Blood testing is recommended for:  Everyone born from [birth year] through 1965.  Anyone with known risk factors for hepatitis C. Sexually transmitted infections (STIs)  You should be screened for sexually transmitted infections (STIs) including gonorrhea and chlamydia if:  You are sexually active and are younger than 45 years of age.  You are older than 45 years of age and your health care provider tells you that you are at risk for this type of infection.  Your sexual activity has changed since you were last screened and you are at an increased risk for chlamydia or gonorrhea. Ask your health care provider if you are at risk.  If you do not have HIV, but are at risk, it may be  recommended that you take a prescription medicine daily to prevent HIV infection. This is called pre-exposure prophylaxis (PrEP). You are considered at risk if:  You are sexually active and do not regularly use condoms or know the HIV status of your partner(s).  You take drugs by injection.  You are sexually active with a partner who has HIV. Talk with your health care provider about whether you are at high risk  of being infected with HIV. If you choose to begin PrEP, you should first be tested for HIV. You should then be tested every 3 months for as long as you are taking PrEP.  PREGNANCY   If you are premenopausal and you may become pregnant, ask your health care provider about preconception counseling.  If you may become pregnant, take 400 to 800 micrograms (mcg) of folic acid every day.  If you want to prevent pregnancy, talk to your health care provider about birth control (contraception). OSTEOPOROSIS AND MENOPAUSE   Osteoporosis is a disease in which the bones lose minerals and strength with aging. This can result in serious bone fractures. Your risk for osteoporosis can be identified using a bone density scan.  If you are 51 years of age or older, or if you are at risk for osteoporosis and fractures, ask your health care provider if you should be screened.  Ask your health care provider whether you should take a calcium or vitamin D supplement to lower your risk for osteoporosis.  Menopause may have certain physical symptoms and risks.  Hormone replacement therapy may reduce some of these symptoms and risks. Talk to your health care provider about whether hormone replacement therapy is right for you.  HOME CARE INSTRUCTIONS   Schedule regular health, dental, and eye exams.  Stay current with your immunizations.   Do not use any tobacco products including cigarettes, chewing tobacco, or electronic cigarettes.  If you are pregnant, do not drink alcohol.  If you are  breastfeeding, limit how much and how often you drink alcohol.  Limit alcohol intake to no more than 1 drink per day for nonpregnant women. One drink equals 12 ounces of beer, 5 ounces of wine, or 1 ounces of hard liquor.  Do not use street drugs.  Do not share needles.  Ask your health care provider for help if you need support or information about quitting drugs.  Tell your health care provider if you often feel depressed.  Tell your health care provider if you have ever been abused or do not feel safe at home.   This information is not intended to replace advice given to you by your health care provider. Make sure you discuss any questions you have with your health care provider.   Document Released: 08/29/2010 Document Revised: 03/06/2014 Document Reviewed: 01/15/2013 Elsevier Interactive Patient Education [employer].

## 2015-09-27 NOTE — Assessment & Plan Note (Signed)
Stable period, no change in functional status. Goes to adult day center

## 2015-09-27 NOTE — Assessment & Plan Note (Signed)
Preventative protocols reviewed and updated unless pt declined. Discussed healthy diet and lifestyle.  

## 2015-09-27 NOTE — Progress Notes (Signed)
BP 128/84   Pulse 64   Temp 98.2 F (36.8 C) (Oral)   Ht  (1.473 m)   Wt 219 lb 8 oz (99.6 kg)   LMP 09/17/2015   BMI 45.88 kg/m    CC: medicare wellness visit Subjective:    Patient ID: [name], female    DOB: 02/12/1971, 45 y.o.   MRN: [id number]  HPI: [name] is a 45 y.o. female presenting on 09/27/2015 for Annual Exam   Sunburn recently. L hand staying with eczema - treating with clobetasol cream daily.   Pending f/u with ortho Dr Dion Saucier today for known knee arthritis.   OSA on CPAP and 1L night time O2  Hearing - failed. Requests audiology or ENT referral today. Vision - Sees Dr. Adriana Mccallum at [facility]. Last seen this month, Q6 mo.  Denies mood issues - depression/anhedonia/sadness No falls in last year.  Preventative: Regular periods LMP 09/14/15 Well woman - discussed with mom. Doesn't want pap smear or pelvic exam or breast exam.  Mammogram WNL 04/2015.  Flu shot - yearly  Pneumovax 2013.  Tdap - 01/2013.  Advanced directives: mom would be HCPOA but this has not been set up yet - mom will work on this. Has paperwork at home.  Sunscreen use discussed.  Lives with mom and dad, dogs Stays at daycare during the day Activity: walking some Diet: good water, fruits/vegetables daily  Relevant past medical, surgical, family and social history reviewed and updated as indicated. Interim medical history since our last visit reviewed. Allergies and medications reviewed and updated. Current Outpatient Prescriptions on File Prior to Visit  Medication Sig  . albuterol (PROVENTIL) (2.5 MG/3ML) 0.083% nebulizer solution Take 3 mLs (2.5 mg total) by nebulization every 6 (six) hours as needed for wheezing or shortness of breath.  . clobetasol cream (TEMOVATE) 0.05 % APPLY TO AFFECTED AREA TWO TIMES A DAY.  . levETIRAcetam (KEPPRA) 500 MG tablet TAKE 1 TABLET (500 MG TOTAL) BY MOUTH EVERY 12 (TWELVE) HOURS.  Marland Kitchen levothyroxine (SYNTHROID, LEVOTHROID) 125 MCG tablet  TAKE 1 TABLET BY MOUTH DAILY BEFORE BREAKFAST.  . memantine (NAMENDA) 10 MG tablet Take 1 tablet (10 mg total) by mouth 2 (two) times daily.  . Multiple Vitamins-Minerals (WOMENS MULTI PO) Take 1 tablet by mouth daily.   . mupirocin ointment (BACTROBAN) 2 % Apply 1 application topically 2 (two) times daily.   No current facility-administered medications on file prior to visit.     Review of Systems  Constitutional: Negative for activity change, appetite change, chills, fatigue, fever and unexpected weight change.  HENT: Negative for hearing loss.   Eyes: Negative for visual disturbance.  Respiratory: Negative for cough, chest tightness, shortness of breath and wheezing.   Cardiovascular: Negative for chest pain, palpitations and leg swelling.  Gastrointestinal: Positive for abdominal pain. Negative for abdominal distention, blood in stool, constipation, diarrhea, nausea and vomiting.  Genitourinary: Negative for difficulty urinating and hematuria.  Musculoskeletal: Positive for arthralgias. Negative for myalgias and neck pain.  Skin: Negative for rash.  Neurological: Negative for dizziness, seizures, syncope and headaches.  Hematological: Negative for adenopathy. Does not bruise/bleed easily.  Psychiatric/Behavioral: Positive for dysphoric mood. The patient is not nervous/anxious.    Per HPI unless specifically indicated in ROS section     Objective:    BP 128/84   Pulse 64   Temp 98.2 F (36.8 C) (Oral)   Ht  (1.473 m)   Wt 219 lb 8 oz (99.6 kg)  LMP 09/17/2015   BMI 45.88 kg/m   Wt Readings from Last 3 Encounters:  09/27/15 219 lb 8 oz (99.6 kg)  08/03/15 228 lb 6.4 oz (103.6 kg)  06/23/15 227 lb (103 kg)    Physical Exam  Constitutional: She is oriented to person, place, and time. She appears well-developed and well-nourished. No distress.  HENT:  Head: Normocephalic and atraumatic.  Right Ear: Hearing, external ear and ear canal normal.  Left Ear: Hearing,  external ear and ear canal normal.  Nose: Nose normal.  Mouth/Throat: Uvula is midline, oropharynx is clear and moist and mucous membranes are normal. No oropharyngeal exudate, posterior oropharyngeal edema or posterior oropharyngeal erythema.  Cerumen impaction  Eyes: Conjunctivae and EOM are normal. Pupils are equal, round, and reactive to light. No scleral icterus.  Neck: Normal range of motion. Neck supple.  Cardiovascular: Normal rate, regular rhythm, normal heart sounds and intact distal pulses.   No murmur heard. Pulses:      Radial pulses are 2+ on the right side, and 2+ on the left side.  Pulmonary/Chest: Effort normal and breath sounds normal. No respiratory distress. She has no wheezes. She has no rales.  Abdominal: Soft. Bowel sounds are normal. She exhibits no distension and no mass. There is no tenderness. There is no rebound and no guarding.  Musculoskeletal: Normal range of motion. She exhibits no edema.  Lymphadenopathy:    She has no cervical adenopathy.  Neurological: She is alert and oriented to person, place, and time.  CN grossly intact, station and gait intact  Skin: Skin is warm and dry. No rash noted.  Psychiatric: She has a normal mood and affect. Her behavior is normal. Judgment and thought content normal.  Nursing note and vitals reviewed.  Results for orders placed or performed in visit on 05/07/15  HM MAMMOGRAPHY  Result Value Ref Range   HM Mammogram Normal birads 1-Repeat 1 year    Lab Results  Component Value Date   TSH 0.683 09/26/2014       Assessment & Plan:   Problem List Items Addressed This Visit    Advanced care planning/counseling discussion    Advanced directives: mom would be HCPOA but this has not been set up yet - mom will work on this. Has paperwork at home.       Down syndrome    Stable period, no change in functional status. Goes to adult day center      Dyshidrotic eczema    Ongoing hand eczema despite clobetasol       Health maintenance examination    Preventative protocols reviewed and updated unless pt declined. Discussed healthy diet and lifestyle.       Hearing loss due to cerumen impaction    Failed hearing screen today. Previously has tolerated irrigation in office, however mother requests referral to ENT today.       Relevant Orders   Ambulatory referral to ENT   Hypothyroid    Update TSH.       Relevant Orders   TSH   Knee osteoarthritis    Appreciate ortho care. Has f/u appt this afternoon with Dr Dion Saucier.      Relevant Medications   meloxicam (MOBIC) 15 MG tablet   Lower abdominal pain    Ongoing lower abdominal pain over last 1 year. Overall benign exam today. She also has had normal [place].  Check UA, labs (CBC, CMP, lipase). ?IBS as improved after bowel movement - suggested start probiotic daily, and if  ongoing pain despite this would consider trial of levsin or bentyl.       Relevant Orders   Comprehensive metabolic panel   CBC with Differential/Platelet   Lipase   MDD (major depressive disorder), recurrent episode, moderate (HCC)    Depression with OCD. Prior hoarding.  Mom notes worsening irritability and general negative disposition.  Off prozac for the past year. Will re trial sertraline, start 25mg  daily x 2-3 wks, with option to increase to 50mg  daily if beneficial.      Relevant Medications   sertraline (ZOLOFT) 25 MG tablet   Medicare annual wellness visit, subsequent - Primary    I have personally reviewed the Medicare Annual Wellness questionnaire and have noted 1. The patient's medical and social history 2. Their use of alcohol, tobacco or illicit drugs 3. Their current medications and supplements 4. The patient's functional ability including ADL's, fall risks, home safety risks and hearing or visual impairment. Cognitive function has been assessed and addressed as indicated.  5. Diet and physical activity 6. Evidence for depression or mood disorders The  patients weight, height, BMI have been recorded in the chart. I have made referrals, counseling and provided education to the patient based on review of the above and I have provided the pt with a written personalized care plan for preventive services. Provider list updated.. See scanned questionairre as needed for further documentation. Reviewed preventative protocols and updated unless pt declined.       Morbid obesity with BMI of 45.0-49.9, adult (HCC)    Congratulated on weight loss noted today. Continue to encourage healthy diet and lifestyle changes to affect sustainable weight loss.  She enjoys aquatic exercise, other weight bearing exercise limited by knee pain.      OSA on CPAP    Appreciate pulm care. Compliant with CPAP and 1L nocturnal O2      Prediabetes    Check A1c today.       Relevant Orders   Hemoglobin A1c   Presenile dementia, uncomplicated    Continue namenda. Followed by neuro.      Relevant Medications   sertraline (ZOLOFT) 25 MG tablet   Seizure (HCC)    Stable on keppra, followed by neurology.       Other Visit Diagnoses    Macrocytosis       Relevant Orders   Vitamin B12   VITAMIN D 25 Hydroxy (Vit-D Deficiency, Fractures)       Follow up plan: Return in about 1 year (around 09/26/2016) for medicare wellness visit.  Eustaquio Boyden, MD

## 2015-09-27 NOTE — Assessment & Plan Note (Signed)
Continue namenda. Followed by neuro.

## 2015-09-27 NOTE — Assessment & Plan Note (Signed)
Advanced directives: mom would be HCPOA but this has not been set up yet - mom will work on this. Has paperwork at home.

## 2015-09-27 NOTE — Assessment & Plan Note (Signed)
Check A1c today.

## 2015-09-27 NOTE — Assessment & Plan Note (Signed)
Depression with OCD. Prior hoarding.  Mom notes worsening irritability and general negative disposition.  Off prozac for the past year. Will re trial sertraline, start 25mg  daily x 2-3 wks, with option to increase to 50mg  daily if beneficial.

## 2015-09-27 NOTE — Assessment & Plan Note (Signed)
Congratulated on weight loss noted today. Continue to encourage healthy diet and lifestyle changes to affect sustainable weight loss.  She enjoys aquatic exercise, other weight bearing exercise limited by knee pain.

## 2015-09-27 NOTE — Assessment & Plan Note (Signed)
Update TSH

## 2015-09-27 NOTE — Assessment & Plan Note (Signed)
Ongoing hand eczema despite clobetasol

## 2015-09-30 ENCOUNTER — Ambulatory Visit (INDEPENDENT_AMBULATORY_CARE_PROVIDER_SITE_OTHER): Payer: Commercial Managed Care - HMO | Admitting: Podiatry

## 2015-09-30 ENCOUNTER — Encounter: Payer: Self-pay | Admitting: Podiatry

## 2015-09-30 DIAGNOSIS — M79676 Pain in unspecified toe(s): Secondary | ICD-10-CM

## 2015-09-30 DIAGNOSIS — B351 Tinea unguium: Secondary | ICD-10-CM | POA: Diagnosis not present

## 2015-10-01 NOTE — Progress Notes (Signed)
She presents today with chief complaint of painful elongated toenails which are incurvated nail margins of the hallux bilateral.  Objective: Vital signs are stable alert and oriented 3. Pulses are palpable. Neurologic sensorium is intact. She is sharp rated now marginally tibiofibular border of the hallux bilateral with no erythema cellulitis drainage or odor the remainder of the nails are thick yellow dystrophic onychomycotic.  Assessment: Onychocryptosis hallux bilateral onychomycosis bilateral foot.  Plan: Debridement of nails 1 through 5 bilateral.

## 2015-10-02 ENCOUNTER — Encounter: Payer: Self-pay | Admitting: Family Medicine

## 2015-10-02 DIAGNOSIS — D7589 Other specified diseases of blood and blood-forming organs: Secondary | ICD-10-CM | POA: Insufficient documentation

## 2015-10-04 ENCOUNTER — Encounter: Payer: Self-pay | Admitting: *Deleted

## 2015-10-19 DIAGNOSIS — H900 Conductive hearing loss, bilateral: Secondary | ICD-10-CM | POA: Insufficient documentation

## 2015-10-19 DIAGNOSIS — H6123 Impacted cerumen, bilateral: Secondary | ICD-10-CM | POA: Insufficient documentation

## 2015-11-05 ENCOUNTER — Telehealth: Payer: Self-pay

## 2015-11-05 MED ORDER — HYOSCYAMINE SULFATE 0.125 MG PO TABS: 0.1250 mg | ORAL_TABLET | Freq: Three times a day (TID) | ORAL | 0 refills | Status: DC

## 2015-11-05 NOTE — Telephone Encounter (Signed)
plz notify levsin sent in to try and update us with effect in 1-2 wks. Take with meals.

## 2015-11-05 NOTE — Telephone Encounter (Signed)
Lurena JoinerRebecca pts mom (DPR signed) left v/m; pt continues with persistent abd pain; ?IBS as discussed at annual exam on 09/27/15.In office note if pt continues with abd pain may try levsin or bentyl. Pt's mom request to try med for abd pain. Lurena Joinerebecca request cb.[facility].

## 2015-11-05 NOTE — Telephone Encounter (Signed)
Message left advising patient's mother.  

## 2015-11-17 ENCOUNTER — Ambulatory Visit (INDEPENDENT_AMBULATORY_CARE_PROVIDER_SITE_OTHER): Payer: Commercial Managed Care - HMO

## 2015-11-17 DIAGNOSIS — Z23 Encounter for immunization: Secondary | ICD-10-CM

## 2015-12-06 ENCOUNTER — Telehealth: Payer: Self-pay

## 2015-12-06 DIAGNOSIS — R1030 Lower abdominal pain, unspecified: Secondary | ICD-10-CM

## 2015-12-06 NOTE — Telephone Encounter (Signed)
[name] left a message on triage line stating pt is continuing to have GI issues. Her adult daycare has called to say she is complaining of abdominal pain. Mom is asking if she could get a referral to GI. Please advise

## 2015-12-07 ENCOUNTER — Encounter: Payer: Self-pay | Admitting: Gastroenterology

## 2015-12-07 MED ORDER — OMEPRAZOLE 40 MG PO CPDR: 40.0000 mg | DELAYED_RELEASE_CAPSULE | Freq: Every day | ORAL | 3 refills | Status: DC

## 2015-12-07 NOTE — Telephone Encounter (Signed)
Presumed IBS, failed probiotic, failed levsin. Will refer to GI.  Also recommend start PPI while on chronic meloxicam for knees. Sent to pharmacy. Take daily for 3 wks then as needed. Stop levsin if ineffective.

## 2015-12-07 NOTE — Telephone Encounter (Signed)
Patient's mother notified and will await call to schedule appt.

## 2015-12-10 ENCOUNTER — Telehealth: Payer: Self-pay

## 2015-12-10 NOTE — Telephone Encounter (Signed)
Lurena JoinerRebecca, pts mother left v/m; pt has been taking zoloft twice daily since 09/27/15. Pt cries all the time. Lurena JoinerRebecca thinks that pt needs to stop Zoloft and wants to know how to wien pt off Zoloft. Lurena Joinerebecca request cb. Last annual 09/27/15.

## 2015-12-10 NOTE — Telephone Encounter (Signed)
Message left advising patient's mother.  

## 2015-12-10 NOTE — Telephone Encounter (Signed)
Please decrease zoloft to 25mg  daily over next 2 weeks then if desires to come off, may stop. Update us with mood.

## 2015-12-13 ENCOUNTER — Ambulatory Visit: Payer: Commercial Managed Care - HMO | Admitting: Nurse Practitioner

## 2015-12-15 ENCOUNTER — Ambulatory Visit (INDEPENDENT_AMBULATORY_CARE_PROVIDER_SITE_OTHER): Payer: Commercial Managed Care - HMO | Admitting: Nurse Practitioner

## 2015-12-15 ENCOUNTER — Encounter: Payer: Self-pay | Admitting: Nurse Practitioner

## 2015-12-15 VITALS — BP 104/70 | HR 60 | Ht <= 58 in | Wt 225.0 lb

## 2015-12-15 DIAGNOSIS — G40209 Localization-related (focal) (partial) symptomatic epilepsy and epileptic syndromes with complex partial seizures, not intractable, without status epilepticus: Secondary | ICD-10-CM | POA: Diagnosis not present

## 2015-12-15 DIAGNOSIS — Q909 Down syndrome, unspecified: Secondary | ICD-10-CM

## 2015-12-15 DIAGNOSIS — F0390 Unspecified dementia without behavioral disturbance: Secondary | ICD-10-CM

## 2015-12-15 MED ORDER — LEVETIRACETAM 500 MG PO TABS: ORAL_TABLET | ORAL | 3 refills | Status: DC

## 2015-12-15 MED ORDER — MEMANTINE HCL 10 MG PO TABS
10.0000 mg | ORAL_TABLET | Freq: Two times a day (BID) | ORAL | 11 refills | Status: DC
Start: 2015-12-15 — End: 2016-12-16

## 2015-12-15 NOTE — Progress Notes (Signed)
[facility]  PATIENT: [name] DOB: 1970/12/04   REASON FOR VISIT: Follow-up for seizure disorder, [name] HISTORY FROM:    HISTORY OF PRESENT ILLNESS:UPDATE 12/15/2015 CM Ms. [name], 45 year old female returns for followup with her mother. She has a history of Downs syndrome and  cognitive decline over several years as well as seizure disorder. She is currently well controlled on Keppra and she is on Namenda without side effects to either drug. She is overweight, gets little exercise, lives at home. She goes to adult daycare 5 days a week . She has been having a lot of abdominal pain and diarrhea, she has appointment with GI in December.  She occasionally has behavior problems and was recently placed on Zoloft however it made her irritability worse and she is slowly titrating off this. She needs refills on Keppra and Namenda . She returns for reevaluation Update 10/14/16PS : She returns for follow-up after last visit a year ago. She is a complaint by sister who provides most of the history. She continues to do well and has not had any breakthrough seizures now for a few years. Her memory and cognitive difficulties remain unchanged. She requires close supervision at home and does spend the day at the daycare center. She can feed dress herself and bathe herself. She is never left alone. There have been no issues with agitation, behavior, delusions or hallucinations or safety concerns. Her gait imbalance remained stable and she has had no falls.  HISTORY: [name] is a 52 year year old Caucasian lady with lifelong history of Down`s syndrome and cognitive decline in last several years from dementia who is referred back to see need for a new problem of seizures. She had recent prolonged hospital stay [facility] pneumonia with empyema and ventilator dependent respiratory failure. She underwent video assisted thoracotomy  for empyma drainage. She required prolonged antibiotics and was eventually required tracheostomy. She had 2 brief episodes of what sound like complex partial seizures one week apart in September 2013. Her mother who is present today was eyewitness to both episodes. She was described as staring and unresponsive not following commands with a distant look on her face lasting a few minutes followed by a period of disorientation and tiredness. In the second episode she had some clonic activity in both her upper extremities. She was started on Keppra 500 twice a day falling and abnormal EEG on 11/04/2011 and with showed sharp waves in the left occipital parietal region. She was initially off her Namenda and Prozac in the hospital stay and had some behavioral agitation but settled down after the medicines were restarted. She has done well since discharge and has not had any more seizures. She seems to be tolerating Keppra quite well without any side effects. She is finished a course of antibiotics and home physical and occupation therapy. She is back living at home with mom and plans to start attending adult daycare soon. She apparently did not have any brain imaging study done.  03/11/12: Ms Osmundson returns for followup. She was last seen by Dr. [name] 12/11/11: She had prolonged hospitilization in Sept 2013 and had seizure events. She was started on Keppra at that time. No further seizure events. MRI of the brain 12/18/11 was normal. EEG was abnormal suggestive of mild focal irritability in right frontal and temporal regions but no definite epileptiform  activity is noted. ROS neg.    REVIEW OF SYSTEMS: Full 14  system review of systems performed and notable only for those listed, all others are neg:  Constitutional: neg  Cardiovascular: neg Ear/Nose/Throat: neg  Skin: neg Eyes: neg Respiratory: neg Gastroitestinal: Abdominal pain diarrhea Hematology/Lymphatic: neg  Endocrine:  neg Musculoskeletal:neg Allergy/Immunology: neg Neurological: neg Psychiatric: Agitation at times Sleep : neg   ALLERGIES: No Known Allergies  HOME MEDICATIONS: Outpatient Medications Prior to Visit  Medication Sig Dispense Refill  . albuterol (PROVENTIL) (2.5 MG/3ML) 0.083% nebulizer solution Take 3 mLs (2.5 mg total) by nebulization every 6 (six) hours as needed for wheezing or shortness of breath. 75 mL 0  . clobetasol cream (TEMOVATE) 0.05 % APPLY TO AFFECTED AREA TWO TIMES A DAY. 60 g 0  . levETIRAcetam (KEPPRA) 500 MG tablet TAKE 1 TABLET (500 MG TOTAL) BY MOUTH EVERY 12 (TWELVE) HOURS. 180 tablet 3  . levothyroxine (SYNTHROID, LEVOTHROID) 125 MCG tablet TAKE 1 TABLET BY MOUTH DAILY BEFORE BREAKFAST. 90 tablet 3  . memantine (NAMENDA) 10 MG tablet Take 1 tablet (10 mg total) by mouth 2 (two) times daily. 60 tablet 11  . Multiple Vitamins-Minerals (WOMENS MULTI PO) Take 1 tablet by mouth daily.     . mupirocin ointment (BACTROBAN) 2 % Apply 1 application topically 2 (two) times daily. (Patient taking differently: Apply 1 application topically 2 (two) times daily as needed. ) 22 g 0  . omeprazole (PRILOSEC) 40 MG capsule Take 1 capsule (40 mg total) by mouth daily. Take daily for 3 weeks then as needed 30 capsule 3  . sertraline (ZOLOFT) 25 MG tablet Take 1 tablet (25 mg total) by mouth daily. 1 tablet daily for 3 wks then increase to 2 tablets daily (50mg ) (Patient taking differently: Take 25 mg by mouth daily. 1 tablet daily for 3 wks then increase to 2 tablets daily (50mg ).  12-15-15  Taking one tablet daily for 3 wks then will stop.) 60 tablet 11  . meloxicam (MOBIC) 15 MG tablet Take 15 mg by mouth daily.    . hyoscyamine (LEVSIN, ANASPAZ) 0.125 MG tablet Take 1 tablet (0.125 mg total) by mouth 3 (three) times daily before meals. (Patient not taking: Reported on 12/15/2015) 40 tablet 0   No facility-administered medications prior to visit.     PAST MEDICAL HISTORY: Past Medical  History:  Diagnosis Date  . Dementia due to another medical condition    Trisomy 21-related, sees [name] yearly, nl CT head 2010, did not tolerate aricept  . Depression    with OCD  . Diabetes mellitus (HCC)    "just when she was on TPN"  . DJD (degenerative joint disease) of knee    bilat knee arthritis, had 3/3 Supartz injections [name])  . Down syndrome   . 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 Eczema   . Empyema of left pleural space (HCC) 09/24/2011  . Hypothyroid   . OCD (obsessive compulsive disorder)   . On home oxygen therapy    "1L just at night" (09/25/2014)  . OSA on CPAP 05/2012   severe OSA with AHI 118, desat nadir to 67%  . Pneumonia    multiple pneumonias  . Presenile dementia, uncomplicated   . Seizure (HCC) 2013   x 2 ; "very mild"; during hospitalization "while coming off vent"  . Sleep-related hypoventilation 09/23/2012    PAST SURGICAL HISTORY: Past Surgical History:  Procedure Laterality Date  . ANTERIOR CRUCIATE LIGAMENT REPAIR Left   . BACK SURGERY    . FLEXIBLE BRONCHOSCOPY  09/24/2011   Procedure: FLEXIBLE BRONCHOSCOPY;  Surgeon: Purcell Nailslarence H Owen, MD;  [facility];  Service: Thoracic;  Laterality: N/A;  . LUMBAR DISC SURGERY     ruptured disc  . TRACHEOSTOMY TUBE PLACEMENT  10/11/2011   Procedure: TRACHEOSTOMY;  Surgeon: Serena ColonelJefry Rosen, MD;  [facility];  Service: ENT;  Laterality: N/A;  . VIDEO ASSISTED THORACOSCOPY (VATS) /THOROCOTOMY/RADIOACTIVE SEED IMPLANT Left 09/24/2011   VATS, (Dr. Barry Dieneswens)    FAMILY HISTORY: Family History  Problem Relation Age of Onset  . Arthritis Mother   . Hypertension Father   . Diabetes Father   . Cancer Neg Hx   . Coronary artery disease Neg Hx   . Stroke Neg Hx     SOCIAL HISTORY: Social History   Social History  . Marital status: Single    Spouse name: N/A  . Number of children: 0  . Years of education: N/A   Occupational History  . Not on file.   Social History Main Topics  . Smoking status: Never Smoker  .  Smokeless tobacco: Never Used  . Alcohol use No  . Drug use: No  . Sexual activity: No   Other Topics Concern  . Not on file   Social History Narrative   Lives with mom and dad, dogs   Stays at daycare during the day   Activity: walking some   Diet: good water, fruits/vegetables daily     PHYSICAL EXAM  Vitals:   12/15/15 0804  BP: 104/70  Pulse: 60  Weight: 225 lb (102.1 kg)  Height: 4\' 10"  (1.473 m)   Body mass index is 47.03 kg/m. Generalized: Well developed, obese female in no acute distress with downs features  Head: normocephalic and atraumatic,.    Neck: Supple, no carotid bruits  Cardiac: Regular rate rhythm, no murmur  Neurological examination  Mentation: Alert . Follows all commands speech and language fluent but with childlike quality. Diminished attention, registration and recall. Cranial nerve II-XII: Pupils were equal round reactive to light extraocular movements with end gaze horizontal nystagmus , visual field were full on confrontational test. Facial sensation and strength were normal. hearing was intact to finger rubbing bilaterally. Uvula tongue midline. head turning and shoulder shrug and were normal and symmetric.Tongue protrusion into cheek strength was normal.  Motor: normal bulk and tone, full strength in the BUE, BLE, fine finger movements normal, no pronator drift. No focal weakness  Sensory: normal and symmetric to light touch, pinprick, and vibration  Coordination: finger-nose-finger, heel-to-shin bilaterally, no dysmetria  Reflexes: Brachioradialis 2/2, biceps 2/2, triceps 2/2, patellar 2/2, Achilles 2/2, plantar responses were flexor bilaterally.  Gait and Station: Rising up from seated position without assistance, wide base stance, moderate stride, good arm swing, smooth turning, able to perform tiptoe, and heel walking without difficulty. Unsteady with tandem  DIAGNOSTIC DATA (LABS, IMAGING, TESTING) - I reviewed patient records, labs, notes,  testing and imaging myself where available.  Lab Results  Component Value Date   WBC 5.4 09/27/2015   HGB 13.4 09/27/2015   HCT 41.2 09/27/2015   MCV 101.9 (H) 09/27/2015   PLT 227.0 09/27/2015      Component Value Date/Time   NA 137 09/27/2015 1207   K 4.2 09/27/2015 1207   CL 102 09/27/2015 1207   CO2 30 09/27/2015 1207   GLUCOSE 95 09/27/2015 1207   BUN 16 09/27/2015 1207   CREATININE 1.02 09/27/2015 1207   CALCIUM 9.0 09/27/2015 1207   PROT 7.3 09/27/2015 1207   ALBUMIN 3.5 09/27/2015 1207   AST  24 09/27/2015 1207   ALT 42 (H) 09/27/2015 1207   ALKPHOS 60 09/27/2015 1207   BILITOT 0.3 09/27/2015 1207   GFRNONAA >60 09/27/2014 0700   GFRAA >60 09/27/2014 0700    Lab Results  Component Value Date   HGBA1C 5.7 09/27/2015   Lab Results  Component Value Date   VITAMINB12 429 09/27/2015   Lab Results  Component Value Date   TSH 0.44 09/27/2015      ASSESSMENT AND PLAN 45 y.o. year old female  has a past medical history of Downs syndrome; Depression;  Seizure; OSA on CPAP (05/2012);  and Presenile dementia, uncomplicated. here to followup. She has not had seizure activity in the last  few years,  The patient is a current patient of Dr. [name] who is out of the office today . This note is sent to the work in doctor.     Continue Namenda at current dose will refill  Continue Keppra at current dose will refill Call for any seizure activity Follow-up yearly and when necessary Nilda Riggs, [facility], [facility], APRN  [facility] [address], [address] [place], [place] [address] [phone]

## 2015-12-15 NOTE — Patient Instructions (Signed)
Continue Namenda at current dose will refill  Continue Keppra at current dose will refill Call for any seizure activity Follow-up yearly and when necessary  

## 2015-12-23 NOTE — Progress Notes (Signed)
I reviewed note and agree with plan.   VIKRAM R. PENUMALLI, MD 12/23/2015, 10:19 PM Certified in Neurology, NeurophysSuanne Markeriology and Neuroimaging  [facility] [address], [address] [place], [place] [id number] [phone]

## 2015-12-25 ENCOUNTER — Emergency Department (HOSPITAL_COMMUNITY)
Admission: EM | Admit: 2015-12-25 | Discharge: 2015-12-25 | Disposition: A | Discharge status: Home / Self Care | Payer: Commercial Managed Care - HMO | Attending: Emergency Medicine | Admitting: Emergency Medicine

## 2015-12-25 ENCOUNTER — Encounter (HOSPITAL_COMMUNITY): Payer: Self-pay | Admitting: Emergency Medicine

## 2015-12-25 ENCOUNTER — Emergency Department (HOSPITAL_COMMUNITY): Payer: Commercial Managed Care - HMO

## 2015-12-25 ENCOUNTER — Emergency Department (HOSPITAL_BASED_OUTPATIENT_CLINIC_OR_DEPARTMENT_OTHER)
Admit: 2015-12-25 | Discharge: 2015-12-25 | Disposition: A | Discharge status: Home / Self Care | Payer: Commercial Managed Care - HMO | Attending: Emergency Medicine | Admitting: Emergency Medicine

## 2015-12-25 DIAGNOSIS — M25462 Effusion, left knee: Secondary | ICD-10-CM | POA: Insufficient documentation

## 2015-12-25 DIAGNOSIS — M79605 Pain in left leg: Secondary | ICD-10-CM

## 2015-12-25 DIAGNOSIS — M25562 Pain in left knee: Secondary | ICD-10-CM

## 2015-12-25 DIAGNOSIS — E039 Hypothyroidism, unspecified: Secondary | ICD-10-CM | POA: Diagnosis not present

## 2015-12-25 DIAGNOSIS — Q909 Down syndrome, unspecified: Secondary | ICD-10-CM | POA: Insufficient documentation

## 2015-12-25 DIAGNOSIS — M7989 Other specified soft tissue disorders: Secondary | ICD-10-CM | POA: Diagnosis not present

## 2015-12-25 DIAGNOSIS — E119 Type 2 diabetes mellitus without complications: Secondary | ICD-10-CM | POA: Diagnosis not present

## 2015-12-25 LAB — SYNOVIAL CELL COUNT + DIFF, W/ CRYSTALS
Eosinophils-Synovial: 1 % (ref 0–1)
Lymphocytes-Synovial Fld: 6 % (ref 0–20)
Monocyte-Macrophage-Synovial Fluid: 5 % — ABNORMAL LOW (ref 50–90)
Neutrophil, Synovial: 88 % — ABNORMAL HIGH (ref 0–25)
WBC, Synovial: 9775 /mm3 — ABNORMAL HIGH (ref 0–200)

## 2015-12-25 MED ORDER — HYDROCODONE-ACETAMINOPHEN 5-325 MG PO TABS
2.0000 | ORAL_TABLET | Freq: Once | ORAL | Status: AC
  Administered 2015-12-25: 2 via ORAL
  Filled 2015-12-25: qty 2

## 2015-12-25 MED ORDER — LIDOCAINE-EPINEPHRINE 2 %-1:100000 IJ SOLN
20.0000 mL | Freq: Once | INTRAMUSCULAR | Status: AC
  Administered 2015-12-25: 20 mL via INTRADERMAL
  Filled 2015-12-25: qty 20

## 2015-12-25 NOTE — ED Triage Notes (Signed)
Pts left knee has been bothering her. Pt needs knee replacement and has been receiving gel injections, but this is no longer helping. Pt's knee is swollen and very painful.

## 2015-12-25 NOTE — ED Notes (Signed)
Vascular tech aware of patient 

## 2015-12-25 NOTE — Progress Notes (Signed)
.  VASCULAR LAB PRELIMINARY  PRELIMINARY  PRELIMINARY  PRELIMINARY  Left lower extremity venous duplex completed.    Preliminary report:  Technically difficult due to thickness of the thigh and calf. Left:  No obvious evidence of DVT, superficial thrombosis, or Baker's cyst.  Malaisha Silliman, RVS 12/25/2015, 3:41 PM

## 2015-12-25 NOTE — ED Provider Notes (Signed)
[facility]ENC[facility] DEPT Provider Note   CSN: [id number] Arrival date & time: 12/25/15  1103     History   Chief Complaint Chief Complaint  Patient presents with  . Knee Pain    HPI [name] is a 45 y.o. female.  HPI  45 year old female with a history of trisomy 21 presents with left knee pain. History is taken from mom at the bedside. Mom states that she intermittently gets "gel injections" by Dr. Dion Saucier for her left knee. Most recently ended 3 weeks ago. She has been told she needs a knee replacement. However over the last couple days she's been having more and more limping and more more pain. Now she cannot walk on that leg. Her leg has become quite swollen with pain primarily at the knee. Mom has not noticed any bread and this or warmth. No fevers. Takes meloxicam and most recently took it this morning. No known injuries. Goes to adult day care but no injuries reported.  Past Medical History:  Diagnosis Date  . Dementia due to another medical condition    Trisomy 21-related, sees [name] yearly, nl CT head 2010, did not tolerate aricept  . Depression    with OCD  . Diabetes mellitus (HCC)    "just when she was on TPN"  . DJD (degenerative joint disease) of knee    bilat knee arthritis, had 3/3 Supartz injections [name])  . Down syndrome   . 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 Eczema   . Empyema of left pleural space (HCC) 09/24/2011  . Hypothyroid   . OCD (obsessive compulsive disorder)   . On home oxygen therapy    "1L just at night" (09/25/2014)  . OSA on CPAP 05/2012   severe OSA with AHI 118, desat nadir to 67%  . Pneumonia    multiple pneumonias  . Presenile dementia, uncomplicated   . Seizure (HCC) 2013   x 2 ; "very mild"; during hospitalization "while coming off vent"  . Sleep-related hypoventilation 09/23/2012    Patient Active Problem List   Diagnosis Date Noted  . Macrocytosis without anemia 10/02/2015  . Advanced care planning/counseling discussion 09/27/2015  .  Hearing loss due to cerumen impaction 09/27/2015  . Nail abnormality 10/02/2014  . Lower abdominal pain 10/02/2014  . Recurrent boils 09/25/2014  . Health maintenance examination 07/13/2014  . Lip fissure 07/13/2014  . Dyshidrotic eczema 09/05/2013  . Boil, axilla 09/05/2013  . Medicare annual wellness visit, subsequent 02/13/2013  . Partial epilepsy with impairment of consciousness (HCC) 11/29/2012  . Presenile dementia, uncomplicated 11/29/2012  . Sleep-related hypoventilation 09/23/2012  . OSA on CPAP 11/15/2011  . Morbid obesity with BMI of 45.0-49.9, adult (HCC) 11/15/2011  . Knee osteoarthritis   . Hypothyroid   . Seizure (HCC)   . Prediabetes   . MDD (major depressive disorder), recurrent episode, moderate (HCC)   . Down syndrome   . Transaminitis 09/24/2011    Past Surgical History:  Procedure Laterality Date  . ANTERIOR CRUCIATE LIGAMENT REPAIR Left   . BACK SURGERY    . FLEXIBLE BRONCHOSCOPY  09/24/2011   Procedure: FLEXIBLE BRONCHOSCOPY;  Surgeon: Purcell Nails, MD;  Location: [facility] OR;  Service: Thoracic;  Laterality: N/A;  . LUMBAR DISC SURGERY     ruptured disc  . TRACHEOSTOMY TUBE PLACEMENT  10/11/2011   Procedure: TRACHEOSTOMY;  Surgeon: Serena Colonel, MD;  Location: [facility] OR;  Service: ENT;  Laterality: N/A;  . VIDEO ASSISTED THORACOSCOPY (VATS) /THOROCOTOMY/RADIOACTIVE SEED IMPLANT Left 09/24/2011  VATS, (Dr. Barry Dieneswens)    OB History    No data available       Home Medications    Prior to Admission medications   Medication Sig Start Date End Date Taking? Authorizing Provider  clobetasol cream (TEMOVATE) 0.05 % APPLY TO AFFECTED AREA TWO TIMES A DAY. 08/02/15  Yes Eustaquio BoydenJavier Gutierrez, MD  levETIRAcetam (KEPPRA) 500 MG tablet TAKE 1 TABLET (500 MG TOTAL) BY MOUTH EVERY 12 (TWELVE) HOURS. 12/15/15  Yes Nilda RiggsNancy Carolyn Martin, NP  levothyroxine (SYNTHROID, LEVOTHROID) 125 MCG tablet TAKE 1 TABLET BY MOUTH DAILY BEFORE BREAKFAST. 01/11/15  Yes Eustaquio BoydenJavier Gutierrez, MD    meloxicam (MOBIC) 15 MG tablet Take 15 mg by mouth daily.   Yes Historical Provider, MD  memantine (NAMENDA) 10 MG tablet Take 1 tablet (10 mg total) by mouth 2 (two) times daily. 12/15/15  Yes Nilda RiggsNancy Carolyn Martin, NP  Multiple Vitamins-Minerals (WOMENS MULTI PO) Take 1 tablet by mouth daily.    Yes Historical Provider, MD  mupirocin ointment (BACTROBAN) 2 % Apply 1 application topically 2 (two) times daily. Patient taking differently: Apply 1 application topically 2 (two) times daily as needed.  09/24/14  Yes Amy Michelle NasutiE Bedsole, MD  Probiotic Product (PROBIOTIC PO) Take 1 tablet by mouth daily.   Yes Historical Provider, MD  albuterol (PROVENTIL) (2.5 MG/3ML) 0.083% nebulizer solution Take 3 mLs (2.5 mg total) by nebulization every 6 (six) hours as needed for wheezing or shortness of breath. Patient not taking: Reported on 12/25/2015 04/19/15   Doreene NestKatherine K Clark, NP  omeprazole (PRILOSEC) 40 MG capsule Take 1 capsule (40 mg total) by mouth daily. Take daily for 3 weeks then as needed Patient not taking: Reported on 12/25/2015 12/07/15   Eustaquio BoydenJavier Gutierrez, MD  sertraline (ZOLOFT) 25 MG tablet Take 1 tablet (25 mg total) by mouth daily. 1 tablet daily for 3 wks then increase to 2 tablets daily (50mg ) Patient not taking: Reported on 12/25/2015 09/27/15   Eustaquio BoydenJavier Gutierrez, MD    Family History Family History  Problem Relation Age of Onset  . Arthritis Mother   . Hypertension Father   . Diabetes Father   . Cancer Neg Hx   . Coronary artery disease Neg Hx   . Stroke Neg Hx     Social History Social History  Substance Use Topics  . Smoking status: Never Smoker  . Smokeless tobacco: Never Used  . Alcohol use No     Allergies   Review of patient's allergies indicates no known allergies.   Review of Systems Review of Systems  Unable to perform ROS: Dementia     Physical Exam Updated Vital Signs BP 103/70 (BP Location: Left Arm)   Pulse 69   Temp 97.2 F (36.2 C) (Oral)   Resp 16    Ht 4\' 11"  (1.499 m)   Wt 225 lb (102.1 kg)   LMP 12/22/2015   SpO2 95%   BMI 45.44 kg/m   Physical Exam  Constitutional: She is oriented to person, place, and time. She appears well-developed and well-nourished.  HENT:  Head: Normocephalic and atraumatic.  Right Ear: External ear normal.  Left Ear: External ear normal.  Nose: Nose normal.  Eyes: Right eye exhibits no discharge. Left eye exhibits no discharge.  Cardiovascular: Normal rate, regular rhythm and intact distal pulses.   Pulses:      Dorsalis pedis pulses are 2+ on the right side, and 2+ on the left side.  Pulmonary/Chest: Effort normal.  Abdominal: She exhibits no distension.  Musculoskeletal:       Left knee: She exhibits decreased range of motion, swelling and effusion. She exhibits no erythema. Tenderness found.       Left upper leg: She exhibits swelling.       Left lower leg: She exhibits no tenderness.  Mild warmth over left knee, more than right Difficult exam due to significant swelling but also very large thighs bilaterally  Neurological: She is alert and oriented to person, place, and time.  Skin: Skin is warm and dry.  Nursing note and vitals reviewed.    ED Treatments / Results  Labs (all labs ordered are listed, but only abnormal results are displayed) Labs Reviewed  BODY FLUID CULTURE  GRAM STAIN  SYNOVIAL CELL COUNT + DIFF, W/ CRYSTALS    EKG  EKG Interpretation None       Radiology Dg Knee Complete 4 Views Left  Result Date: 12/25/2015 CLINICAL DATA:  Left knee swelling and pain for 3 weeks EXAM: LEFT KNEE - COMPLETE 4+ VIEW COMPARISON:  None. FINDINGS: No evidence of fracture or dislocation. Chondrocalcinosis of the medial and lateral femorotibial compartments as can be seen with CPPD. Medial and lateral femorotibial compartment joint spaces are relatively well maintained with small marginal osteophytes. Patellofemoral compartment joint space narrowing and marginal osteophytosis most  consistent with mild-moderate osteoarthritis. Possible small joint effusion. IMPRESSION: 1.  No acute osseous injury of the left knee. 2. Osteoarthritis of the left knee as described above. Electronically Signed   By: Elige KoHetal  Patel   On: 12/25/2015 14:11    .VASCULAR LAB PRELIMINARY  PRELIMINARY  PRELIMINARY  PRELIMINARY  Left lower extremity venous duplex completed.    Preliminary report:  Technically difficult due to thickness of the thigh and calf. Left:  No obvious evidence of DVT, superficial thrombosis, or Baker's cyst.  SLAUGHTER, [place], RVS 12/25/2015, 3:41 PM  Procedures .Joint Aspiration/Arthrocentesis Date/Time: 12/25/2015 4:45 PM Performed by: Pricilla LovelessGOLDSTON, Rayn Enderson Authorized by: Pricilla LovelessGOLDSTON, Kyah Buesing   Consent:    Consent obtained:  Verbal and written   Consent given by:  Patient and parent   Risks discussed:  Bleeding, pain, incomplete drainage and nerve damage   Alternatives discussed:  No treatment Location:    Location:  Knee   Knee:  L knee Anesthesia (see MAR for exact dosages):    Anesthesia method:  Local infiltration   Local anesthetic:  Lidocaine 2% WITH epi Procedure details:    Preparation: Patient was prepped and draped in usual sterile fashion     Needle gauge:  18 G   Ultrasound guidance: yes     Approach:  Lateral   Aspirate amount:  20   Aspirate characteristics:  Blood-tinged   Steroid injected: no   Post-procedure details:    Dressing:  Adhesive bandage   Patient tolerance of procedure:  Tolerated well, no immediate complications   (including critical care time)  Medications Ordered in ED Medications  lidocaine-EPINEPHrine (XYLOCAINE W/EPI) 2 %-1:100000 (with pres) injection 20 mL (not administered)  HYDROcodone-acetaminophen (NORCO/VICODIN) 5-325 MG per tablet 2 tablet (2 tablets Oral Given 12/25/15 1337)     Initial Impression / Assessment and Plan / ED Course  I have reviewed the triage vital signs and the nursing notes.  Pertinent labs  & imaging results that were available during my care of the patient were reviewed by me and considered in my medical decision making (see chart for details).  Clinical Course  Comment By Time  Exam is difficult due to very large legs but  it appears to be a significant joint effusion. Mild warmth, no erythema. No fevers. Pain with ROM, especially flexion. Will give hydrocodone for pain, get xray. Talked to mom about arthrocentesis to r/o septic knee. She is wanting to wait on the xray first. Pricilla Loveless, MD 10/28 1320  Some improvement in pain with norco. Xray benign except for the arthritis which is chronic. No obvious large joint effusion on xray although this wouldn't completely rule it out. Given the swelling is prominent in thigh too, will get ultraound to r/o DVT Pricilla Loveless, MD 10/28 1420    Discussed recent benefits of knee arthrocentesis with mom and patient. Mom has signed consent. No overt complications but only a small amount was able to be removed. I feel there is more that I can see on ultrasound but due to either clotting or some other issue I cannot get more fluid out. Discussed with her orthopedist, Dr. Dion Saucier, who will follow up patient on 10/30 or 11/1. Call for appt. Dr. Jeraldine Loots to follow up on arthrocentesis results.  Final Clinical Impressions(s) / ED Diagnoses   Final diagnoses:  Knee effusion, left    New Prescriptions New Prescriptions   No medications on file     Pricilla Loveless, MD 12/25/15 [phone]

## 2015-12-25 NOTE — Discharge Instructions (Addendum)
As discussed, your evaluation today has been largely reassuring.  But, it is important that you monitor your condition carefully, and do not hesitate to return to the ED if you develop new, or concerning changes in your condition.  Please recall that secondary testing is being conducted on your knee joint fluid.  You will be made aware of abnormal findings.  Otherwise, please follow-up with your physician for appropriate ongoing care.

## 2015-12-28 LAB — BODY FLUID CULTURE: CULTURE: NO GROWTH

## 2015-12-30 ENCOUNTER — Ambulatory Visit (INDEPENDENT_AMBULATORY_CARE_PROVIDER_SITE_OTHER): Payer: Commercial Managed Care - HMO | Admitting: Podiatry

## 2015-12-30 ENCOUNTER — Encounter: Payer: Self-pay | Admitting: Podiatry

## 2015-12-30 DIAGNOSIS — B351 Tinea unguium: Secondary | ICD-10-CM

## 2015-12-30 DIAGNOSIS — M79676 Pain in unspecified toe(s): Secondary | ICD-10-CM

## 2015-12-30 NOTE — Progress Notes (Signed)
She presents today with chief complaint of painful elongated toenails.  Objective: No open lesions or wounds are noted. Toenails are thick yellow dystrophic with mycotic pulses remain palpable.  Assessment: Pain and limb secondary to onychomycosis.  Plan: Debridement of toenails 1 through 5 bilateral. Follow-up 3 months.

## 2016-02-08 ENCOUNTER — Ambulatory Visit: Payer: Commercial Managed Care - HMO | Admitting: Gastroenterology

## 2016-02-15 ENCOUNTER — Other Ambulatory Visit: Payer: Self-pay | Admitting: Family Medicine

## 2016-03-28 ENCOUNTER — Ambulatory Visit (INDEPENDENT_AMBULATORY_CARE_PROVIDER_SITE_OTHER): Payer: Medicare Other | Admitting: Gastroenterology

## 2016-03-28 ENCOUNTER — Encounter: Payer: Self-pay | Admitting: Gastroenterology

## 2016-03-28 ENCOUNTER — Encounter (INDEPENDENT_AMBULATORY_CARE_PROVIDER_SITE_OTHER): Payer: Self-pay

## 2016-03-28 VITALS — BP 100/60 | HR 52 | Ht <= 58 in | Wt 226.0 lb

## 2016-03-28 DIAGNOSIS — R197 Diarrhea, unspecified: Secondary | ICD-10-CM

## 2016-03-28 DIAGNOSIS — R1030 Lower abdominal pain, unspecified: Secondary | ICD-10-CM

## 2016-03-28 MED ORDER — HYOSCYAMINE SULFATE 0.125 MG SL SUBL: 0.1250 mg | SUBLINGUAL_TABLET | Freq: Three times a day (TID) | SUBLINGUAL | 1 refills | Status: DC

## 2016-03-28 NOTE — Patient Instructions (Addendum)
Please call us in 3 weeks with an update on condition, so we know whether or not we need to proceed with colonoscopy. [phone] ask for Norberta KeensJulie RN  If you are age 365 or older, your body mass index should be between 23-30. Your Body mass index is 47.23 kg/m. If this is out of the aforementioned range listed, please consider follow up with your Primary Care Provider.  If you are age 46 or younger, your body mass index should be between 19-25. Your Body mass index is 47.23 kg/m. If this is out of the aformentioned range listed, please consider follow up with your Primary Care Provider.   Thank you for choosing [facility]  Dr Amada JupiterHenry Danis III

## 2016-03-28 NOTE — Progress Notes (Signed)
[facility] Consult Note:  History: [name] 03/28/2016  Referring physician: Eustaquio Boyden, MD  Reason for consult/chief complaint: Abdominal Pain (pt reports intermittent lower abdminal pain, sometimes worse when she needs to have a bm; is having loose stools daily; denies blood or mucous in her stool; denies N/V/F)   Subjective  HPI:  [name] is accompanied by her mother today, and was referred by primary care for about 2 years of abdominal pain. History is somewhat limited by her cognitive impairment. She has had a stable constellation of symptoms for about the last 2 years that include postprandial abdominal pain either in the lower abdomen in a bandlike fashion or sometimes in the epigastrium. She will then have the urgent need for a BM which is usually loose, and she feels better almost immediately afterwards. The episodes occur several times per week and there is been no rectal bleeding. Her appetite is generally good and her weight increasing. She seems to deny dysphagia, odynophagia, nausea, vomiting or early satiety.   ROS:  Review of Systems  Constitutional: Negative for appetite change and unexpected weight change.  HENT: Negative for mouth sores and voice change.   Eyes: Negative for pain and redness.  Respiratory: Negative for cough and shortness of breath.   Cardiovascular: Negative for chest pain and palpitations.  Genitourinary: Negative for dysuria and hematuria.  Musculoskeletal: Negative for arthralgias and myalgias.  Skin: Negative for pallor and rash.  Neurological: Negative for weakness and headaches.  Hematological: Negative for adenopathy.     Past Medical History: Past Medical History:  Diagnosis Date  . Dementia due to another medical condition    Trisomy 21-related, sees [name] yearly, nl CT head 2010, did not tolerate aricept  . Depression    with OCD  . Diabetes mellitus (HCC)    "just when she was on TPN"  . DJD (degenerative  joint disease) of knee    bilat knee arthritis, had 3/3 Supartz injections [name])  . Down syndrome   . 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 Eczema   . Empyema of left pleural space (HCC) 09/24/2011  . Hypothyroid   . OCD (obsessive compulsive disorder)   . On home oxygen therapy    "1L just at night" (09/25/2014)  . OSA on CPAP 05/2012   severe OSA with AHI 118, desat nadir to 67%  . Pneumonia    multiple pneumonias  . Presenile dementia, uncomplicated   . Seizure (HCC) 2013   x 2 ; "very mild"; during hospitalization "while coming off vent"  . Sleep-related hypoventilation 09/23/2012     Past Surgical History: Past Surgical History:  Procedure Laterality Date  . ANTERIOR CRUCIATE LIGAMENT REPAIR Left   . BACK SURGERY    . FLEXIBLE BRONCHOSCOPY  09/24/2011   Procedure: FLEXIBLE BRONCHOSCOPY;  Surgeon: Purcell Nails, MD;  Location: [facility] OR;  Service: Thoracic;  Laterality: N/A;  . LUMBAR DISC SURGERY     ruptured disc  . TRACHEOSTOMY TUBE PLACEMENT  10/11/2011   Procedure: TRACHEOSTOMY;  Surgeon: Serena Colonel, MD;  Location: [facility] OR;  Service: ENT;  Laterality: N/A;  . VIDEO ASSISTED THORACOSCOPY (VATS) /THOROCOTOMY/RADIOACTIVE SEED IMPLANT Left 09/24/2011   VATS, (Dr. Barry Dienes)     Family History: Family History  Problem Relation Age of Onset  . Arthritis Mother   . Hypertension Father   . Diabetes Father   . Cancer Neg Hx   . Coronary artery disease Neg Hx   . Stroke Neg Hx  Social History: Social History   Social History  . Marital status: Single    Spouse name: N/A  . Number of children: 0  . Years of education: N/A   Social History Main Topics  . Smoking status: Never Smoker  . Smokeless tobacco: Never Used  . Alcohol use No  . Drug use: No  . Sexual activity: No   Other Topics Concern  . None   Social History Narrative   Lives with mom and dad, dogs   Stays at daycare during the day   Activity: walking some   Diet: good water, fruits/vegetables daily     Allergies: No Known Allergies  Outpatient Meds: Current Outpatient Prescriptions  Medication Sig Dispense Refill  . albuterol (PROVENTIL) (2.5 MG/3ML) 0.083% nebulizer solution Take 3 mLs (2.5 mg total) by nebulization every 6 (six) hours as needed for wheezing or shortness of breath. 75 mL 0  . clobetasol cream (TEMOVATE) 0.05 % APPLY TO AFFECTED AREA TWO TIMES A DAY. (Patient taking differently: APPLY TO AFFECTED AREA TWO TIMES A DAY prn) 60 g 0  . colchicine 0.6 MG tablet   0  . levETIRAcetam (KEPPRA) 500 MG tablet TAKE 1 TABLET (500 MG TOTAL) BY MOUTH EVERY 12 (TWELVE) HOURS. 180 tablet 3  . levothyroxine (SYNTHROID, LEVOTHROID) 125 MCG tablet TAKE 1 TABLET BY MOUTH DAILY BEFORE BREAKFAST. 90 tablet 4  . meloxicam (MOBIC) 15 MG tablet Take 15 mg by mouth daily.    . memantine (NAMENDA) 10 MG tablet Take 1 tablet (10 mg total) by mouth 2 (two) times daily. 60 tablet 11  . Multiple Vitamins-Minerals (WOMENS MULTI PO) Take 1 tablet by mouth daily.     . mupirocin ointment (BACTROBAN) 2 % Apply 1 application topically 2 (two) times daily. (Patient taking differently: Apply 1 application topically 2 (two) times daily as needed. ) 22 g 0  . omeprazole (PRILOSEC) 40 MG capsule Take 1 capsule (40 mg total) by mouth daily. Take daily for 3 weeks then as needed 30 capsule 3  . Probiotic Product (PROBIOTIC PO) Take 1 tablet by mouth daily.    . hyoscyamine (LEVSIN SL) 0.125 MG SL tablet Place 1 tablet (0.125 mg total) under the tongue 3 (three) times daily before meals. 60 tablet 1   No current facility-administered medications for this visit.       ___________________________________________________________________ Objective   Exam:  BP 100/60   Pulse (!) 52   Ht 4\' 10"  (1.473 m)   Wt 226 lb (102.5 kg)   BMI 47.23 kg/m    General: this is a(n) Obese middle-aged woman with Down syndrome   Eyes: sclera anicteric, no redness  ENT: oral mucosa moist without lesions, no cervical  or supraclavicular lymphadenopathy, good dentition  CV: RRR without murmur, S1/S2, no JVD, no peripheral edema  Resp: clear to auscultation bilaterally, normal RR and effort noted  GI: soft, mild LLQ tenderness, with active bowel sounds. No guarding or palpable organomegaly noted.  Skin; warm and dry, no rash or jaundice noted  Neuro: awake, alert and oriented x 3. Normal gross motor function and fluent speech  Labs:  CMP Latest Ref Rng & Units 09/27/2015 09/27/2014 09/26/2014  Glucose 70 - 99 mg/dL 95 89 614(E)  BUN 6 - 23 mg/dL 16 12 12   Creatinine 0.40 - 1.20 mg/dL 3.15 4.00 8.67(Y)  Sodium 135 - 145 mEq/L 137 137 138  Potassium 3.5 - 5.1 mEq/L 4.2 4.0 4.0  Chloride 96 - 112 mEq/L 102 105 107  CO2 19 - 32 mEq/L 30 26 26   Calcium 8.4 - 10.5 mg/dL 9.0 6.9(G8.4(L) 2.9(B8.2(L)  Total Protein 6.0 - 8.3 g/dL 7.3 - -  Total Bilirubin 0.2 - 1.2 mg/dL 0.3 - -  Alkaline Phos 39 - 117 U/L 60 - -  AST 0 - 37 U/L 24 - -  ALT 0 - 35 U/L 42(H) - -     Radiologic Studies:  Ultrasound August 2013, done for elevated LFTs, no gallstones seen  Assessment: Encounter Diagnoses  Name Primary?  . Lower abdominal pain Yes  . Diarrhea, unspecified type     History is limited, but it sounds like either IBS or less likely IBD. I offered a trial of antispasmodic therapy or colonoscopy, and her mother has opted for a trial of medicines first. She will call us in about 3 weeks in [place] is not much improved, we will then pursue a colonoscopy. There's been no weight loss to suggest a malabsorptive syndrome.  Thank you for the courtesy of this consult.  Please call me with any questions or concerns.  Charlie PitterHenry L Danis III  CC: Eustaquio BoydenJavier Gutierrez, MD\

## 2016-04-06 ENCOUNTER — Telehealth: Payer: Self-pay | Admitting: Gastroenterology

## 2016-04-10 NOTE — Telephone Encounter (Signed)
Called patient's mother back on 04/06/16, left a message to call. I have not heard back from her.

## 2016-04-11 ENCOUNTER — Telehealth: Payer: Self-pay

## 2016-04-11 NOTE — Telephone Encounter (Signed)
Patient's mom called back with an update, she reports that the Levsin is helping. The only thing that they have noticed is that when patient has occasional soda or carbonated water, then she will have abdominal pain. Advised to stay away from these triggers. Mom also requesting a written order for the Levsin so she can take it to the adult daycare for them to be able to administer the medication. Told patient's mom I would leave it at the front desk.

## 2016-05-10 ENCOUNTER — Ambulatory Visit (INDEPENDENT_AMBULATORY_CARE_PROVIDER_SITE_OTHER): Payer: Medicare Other | Admitting: Podiatry

## 2016-05-10 DIAGNOSIS — M79609 Pain in unspecified limb: Secondary | ICD-10-CM

## 2016-05-10 DIAGNOSIS — L608 Other nail disorders: Secondary | ICD-10-CM

## 2016-05-10 DIAGNOSIS — B351 Tinea unguium: Secondary | ICD-10-CM | POA: Diagnosis not present

## 2016-05-10 DIAGNOSIS — L603 Nail dystrophy: Secondary | ICD-10-CM

## 2016-05-20 NOTE — Progress Notes (Signed)
   SUBJECTIVE Patient  presents to office today complaining of elongated, thickened nails. Pain while ambulating in shoes. Patient is unable to trim their own nails.   OBJECTIVE General Patient is awake, alert, and oriented x 3 and in no acute distress. Derm Skin is dry and supple bilateral. Negative open lesions or macerations. Remaining integument unremarkable. Nails are tender, long, thickened and dystrophic with subungual debris, consistent with onychomycosis, 1-5 bilateral. No signs of infection noted. Vasc  DP and PT pedal pulses palpable bilaterally. Temperature gradient within normal limits.  Neuro Epicritic and protective threshold sensation diminished bilaterally.  Musculoskeletal Exam No symptomatic pedal deformities noted bilateral. Muscular strength within normal limits.  ASSESSMENT 1. Onychodystrophic nails 1-5 bilateral with hyperkeratosis of nails.  2. Onychomycosis of nail due to dermatophyte bilateral 3. Pain in foot bilateral  PLAN OF CARE 1. Patient evaluated today.  2. Instructed to maintain good pedal hygiene and foot care.  3. Mechanical debridement of nails 1-5 bilaterally performed using a nail nipper. Filed with dremel without incident.  4. Return to clinic in 3 mos.   Patient is with her mother. Mother has foot problems. Surgery performed in the past by Dr. Charlsie Merlesregal. She's going to have an appointment with me.   Felecia ShellingBrent M. Aamiyah Derrick, DPM [facility]  Dr. Felecia ShellingBrent M. Grae Leathers, DPM    [address]                                        [place], [place] [address]                Office [phone]  Fax [phone]

## 2016-05-30 ENCOUNTER — Other Ambulatory Visit: Payer: Self-pay | Admitting: Gastroenterology

## 2016-05-30 ENCOUNTER — Telehealth: Payer: Self-pay

## 2016-05-30 NOTE — Telephone Encounter (Signed)
[name] (DPR signed) said that colchicine 0.6 mg is not covered by pts ins. [name] will call ins co to get more affordable med that would be substitution for colchicine. [name] will cb when has info. 09/27/15 was last annual exam.

## 2016-05-31 ENCOUNTER — Other Ambulatory Visit: Payer: Self-pay

## 2016-05-31 NOTE — Telephone Encounter (Signed)
Phone mailbox is full. Will send a letter to have the pt call the office.

## 2016-05-31 NOTE — Telephone Encounter (Signed)
Pr requesting refills for [name] 0.125mg  SL 1 tid as needed. Last seen 02-2016

## 2016-05-31 NOTE — Telephone Encounter (Signed)
In Feb mom said this was not helping her Please clarify

## 2016-06-26 ENCOUNTER — Ambulatory Visit: Payer: Medicaid Other | Admitting: Family Medicine

## 2016-07-14 ENCOUNTER — Other Ambulatory Visit: Payer: Self-pay | Admitting: Family Medicine

## 2016-07-15 NOTE — Telephone Encounter (Signed)
Last office visit 09/27/15.  Last refilled 09/24/14 for 22g with no refills.  Ok to refill?

## 2016-07-19 ENCOUNTER — Other Ambulatory Visit: Payer: Self-pay | Admitting: *Deleted

## 2016-07-19 ENCOUNTER — Telehealth: Payer: Self-pay

## 2016-07-19 MED ORDER — COLCHICINE 0.6 MG PO TABS: 0.6000 mg | ORAL_TABLET | Freq: Every day | ORAL | 1 refills | Status: DC | PRN

## 2016-07-19 NOTE — Telephone Encounter (Signed)
plz notify this was sent in.  Colchicine tends to work better with acute flare. If ongoing pain, may not be as effective. Let us know if not better with colchicine.

## 2016-07-19 NOTE — Telephone Encounter (Signed)
Christine JoinerRebecca pts mom said pt had been treated for gout of knee by Dr Kirtland BouchardLando, surgeon; but Dr Kirtland BouchardLando advised Christine Joinerebecca to ck with PCP for refill colchicine for gout. Pt having trouble walking due to gout in lt knee. Request refill colchicine to [facility]; pts mom said colchicine is covered by ins. Last annual 09/27/15.

## 2016-07-19 NOTE — Telephone Encounter (Signed)
lmom for daughter to return call 

## 2016-07-20 NOTE — Telephone Encounter (Signed)
lmom for daughter to return my call.

## 2016-07-25 NOTE — Telephone Encounter (Signed)
Patient advised.

## 2016-08-14 ENCOUNTER — Ambulatory Visit: Payer: Medicare Other | Admitting: Podiatry

## 2016-08-21 ENCOUNTER — Ambulatory Visit: Payer: Medicare Other | Admitting: Podiatry

## 2016-08-27 ENCOUNTER — Other Ambulatory Visit: Payer: Self-pay | Admitting: Family Medicine

## 2016-08-27 DIAGNOSIS — D7589 Other specified diseases of blood and blood-forming organs: Secondary | ICD-10-CM

## 2016-08-27 DIAGNOSIS — E039 Hypothyroidism, unspecified: Secondary | ICD-10-CM

## 2016-08-27 DIAGNOSIS — R7303 Prediabetes: Secondary | ICD-10-CM

## 2016-08-27 DIAGNOSIS — Z6841 Body Mass Index (BMI) 40.0 and over, adult: Secondary | ICD-10-CM

## 2016-08-27 DIAGNOSIS — R740 Nonspecific elevation of levels of transaminase and lactic acid dehydrogenase [LDH]: Secondary | ICD-10-CM

## 2016-08-27 DIAGNOSIS — R7401 Elevation of levels of liver transaminase levels: Secondary | ICD-10-CM

## 2016-08-29 ENCOUNTER — Other Ambulatory Visit (INDEPENDENT_AMBULATORY_CARE_PROVIDER_SITE_OTHER): Payer: Medicare Other

## 2016-08-29 DIAGNOSIS — R7303 Prediabetes: Secondary | ICD-10-CM | POA: Diagnosis not present

## 2016-08-29 DIAGNOSIS — R740 Nonspecific elevation of levels of transaminase and lactic acid dehydrogenase [LDH]: Secondary | ICD-10-CM | POA: Diagnosis not present

## 2016-08-29 DIAGNOSIS — D7589 Other specified diseases of blood and blood-forming organs: Secondary | ICD-10-CM

## 2016-08-29 DIAGNOSIS — R7401 Elevation of levels of liver transaminase levels: Secondary | ICD-10-CM

## 2016-08-29 DIAGNOSIS — E039 Hypothyroidism, unspecified: Secondary | ICD-10-CM | POA: Diagnosis not present

## 2016-08-29 LAB — CBC WITH DIFFERENTIAL/PLATELET
Basophils Absolute: 0.1 10*3/uL (ref 0.0–0.1)
Basophils Relative: 2.8 % (ref 0.0–3.0)
EOS ABS: 0.2 10*3/uL (ref 0.0–0.7)
Eosinophils Relative: 4.8 % (ref 0.0–5.0)
HCT: 42.7 % (ref 36.0–46.0)
HEMOGLOBIN: 14.2 g/dL (ref 12.0–15.0)
Lymphocytes Relative: 18.7 % (ref 12.0–46.0)
Lymphs Abs: 0.9 10*3/uL (ref 0.7–4.0)
MCHC: 33.4 g/dL (ref 30.0–36.0)
MCV: 104.1 fl — ABNORMAL HIGH (ref 78.0–100.0)
MONO ABS: 0.5 10*3/uL (ref 0.1–1.0)
Monocytes Relative: 11.1 % (ref 3.0–12.0)
Neutro Abs: 2.9 10*3/uL (ref 1.4–7.7)
Neutrophils Relative %: 62.6 % (ref 43.0–77.0)
Platelets: 183 10*3/uL (ref 150.0–400.0)
RBC: 4.1 Mil/uL (ref 3.87–5.11)
RDW: 14.6 % (ref 11.5–15.5)
WBC: 4.7 10*3/uL (ref 4.0–10.5)

## 2016-08-29 LAB — COMPREHENSIVE METABOLIC PANEL
ALK PHOS: 46 U/L (ref 39–117)
ALT: 35 U/L (ref 0–35)
AST: 26 U/L (ref 0–37)
Albumin: 3.7 g/dL (ref 3.5–5.2)
BILIRUBIN TOTAL: 0.3 mg/dL (ref 0.2–1.2)
BUN: 15 mg/dL (ref 6–23)
CALCIUM: 8.9 mg/dL (ref 8.4–10.5)
CO2: 31 mEq/L (ref 19–32)
Chloride: 103 mEq/L (ref 96–112)
Creatinine, Ser: 1.09 mg/dL (ref 0.40–1.20)
GFR: 57.45 mL/min — AB (ref >60.00)
Glucose, Bld: 119 mg/dL — ABNORMAL HIGH (ref 70–99)
Potassium: 4.3 mEq/L (ref 3.5–5.1)
Sodium: 138 mEq/L (ref 135–145)
Total Protein: 7.1 g/dL (ref 6.0–8.3)

## 2016-08-29 LAB — VITAMIN B12: VITAMIN B 12: 482 pg/mL (ref 211–911)

## 2016-08-29 LAB — TSH: TSH: 1.16 u[IU]/mL (ref 0.35–4.50)

## 2016-08-29 LAB — HEMOGLOBIN A1C: HEMOGLOBIN A1C: 5.4 % (ref 4.6–6.5)

## 2016-08-29 LAB — FOLATE: FOLATE: 17.6 ng/mL (ref >5.9)

## 2016-09-06 ENCOUNTER — Ambulatory Visit (INDEPENDENT_AMBULATORY_CARE_PROVIDER_SITE_OTHER): Payer: Medicare Other | Admitting: Podiatry

## 2016-09-06 DIAGNOSIS — M79676 Pain in unspecified toe(s): Secondary | ICD-10-CM | POA: Diagnosis not present

## 2016-09-06 DIAGNOSIS — B351 Tinea unguium: Secondary | ICD-10-CM

## 2016-09-08 ENCOUNTER — Ambulatory Visit (INDEPENDENT_AMBULATORY_CARE_PROVIDER_SITE_OTHER): Payer: Medicare Other | Admitting: Family Medicine

## 2016-09-08 ENCOUNTER — Encounter: Payer: Self-pay | Admitting: Family Medicine

## 2016-09-08 VITALS — BP 92/60 | HR 62 | Ht <= 58 in | Wt 226.0 lb

## 2016-09-08 DIAGNOSIS — F0390 Unspecified dementia without behavioral disturbance: Secondary | ICD-10-CM | POA: Diagnosis not present

## 2016-09-08 DIAGNOSIS — M1A0610 Idiopathic chronic gout, right knee, without tophus (tophi): Secondary | ICD-10-CM

## 2016-09-08 DIAGNOSIS — L301 Dyshidrosis [pompholyx]: Secondary | ICD-10-CM | POA: Diagnosis not present

## 2016-09-08 DIAGNOSIS — G4733 Obstructive sleep apnea (adult) (pediatric): Secondary | ICD-10-CM | POA: Diagnosis not present

## 2016-09-08 DIAGNOSIS — Z7189 Other specified counseling: Secondary | ICD-10-CM

## 2016-09-08 DIAGNOSIS — E039 Hypothyroidism, unspecified: Secondary | ICD-10-CM | POA: Diagnosis not present

## 2016-09-08 DIAGNOSIS — R569 Unspecified convulsions: Secondary | ICD-10-CM

## 2016-09-08 DIAGNOSIS — R7303 Prediabetes: Secondary | ICD-10-CM | POA: Diagnosis not present

## 2016-09-08 DIAGNOSIS — Z6841 Body Mass Index (BMI) 40.0 and over, adult: Secondary | ICD-10-CM

## 2016-09-08 DIAGNOSIS — Z0000 Encounter for general adult medical examination without abnormal findings: Secondary | ICD-10-CM

## 2016-09-08 DIAGNOSIS — R238 Other skin changes: Secondary | ICD-10-CM

## 2016-09-08 DIAGNOSIS — Q909 Down syndrome, unspecified: Secondary | ICD-10-CM | POA: Diagnosis not present

## 2016-09-08 DIAGNOSIS — L219 Seborrheic dermatitis, unspecified: Secondary | ICD-10-CM | POA: Insufficient documentation

## 2016-09-08 DIAGNOSIS — D7589 Other specified diseases of blood and blood-forming organs: Secondary | ICD-10-CM

## 2016-09-08 DIAGNOSIS — F331 Major depressive disorder, recurrent, moderate: Secondary | ICD-10-CM | POA: Diagnosis not present

## 2016-09-08 DIAGNOSIS — Z9989 Dependence on other enabling machines and devices: Secondary | ICD-10-CM

## 2016-09-08 MED ORDER — KETOCONAZOLE 2 % EX SHAM: 1.0000 "application " | MEDICATED_SHAMPOO | CUTANEOUS | 1 refills | Status: DC

## 2016-09-08 MED ORDER — COLCHICINE 0.6 MG PO TABS: 0.6000 mg | ORAL_TABLET | Freq: Every day | ORAL | 1 refills | Status: DC | PRN

## 2016-09-08 MED ORDER — ALLOPURINOL 100 MG PO TABS: 100.0000 mg | ORAL_TABLET | Freq: Every day | ORAL | 11 refills | Status: DC

## 2016-09-08 MED ORDER — CLOBETASOL PROPIONATE 0.05 % EX FOAM: Freq: Every day | CUTANEOUS | 0 refills | Status: DC

## 2016-09-08 NOTE — Assessment & Plan Note (Signed)
Continue CPAP and nocturnal oxygen

## 2016-09-08 NOTE — Progress Notes (Signed)
Pre visit review using our clinic review tool, if applicable. No additional management support is needed unless otherwise documented below in the visit note. 

## 2016-09-08 NOTE — Assessment & Plan Note (Signed)
Chronic, stable. Continue current regimen. 

## 2016-09-08 NOTE — Assessment & Plan Note (Signed)
Anticipate seborrheic dermatitis. Treat with nizoral shampoo and TCI foam PRN.

## 2016-09-08 NOTE — Assessment & Plan Note (Signed)
Ongoing hand eczema. Discussed skin care precautions. Suggested clobetasol treatment x 2 weeks straight then discontinue.

## 2016-09-08 NOTE — Assessment & Plan Note (Signed)
Stable period. No increased level of care needs identified. Continue namenda 10mg  bid.

## 2016-09-08 NOTE — Assessment & Plan Note (Signed)
Advanced directives: mom would be HCPOA but this has not been set up yet - mom will work on this. Has paperwork at home.  

## 2016-09-08 NOTE — Assessment & Plan Note (Signed)
A1c improved, cbg deteriorated. Continue to encourage weight loss.

## 2016-09-08 NOTE — Progress Notes (Signed)
BP 92/60   Pulse 62   Ht 4' 9.25" (1.454 m)   Wt 226 lb (102.5 kg)   LMP 09/01/2016   SpO2 98%   BMI 48.48 kg/m    CC: medicare wellness visit  Subjective:    Patient ID: [name], female    DOB: Apr 10, 1970, 46 y.o.   MRN: [id number]  HPI: [name] is a 46 y.o. female presenting on 09/08/2016 for Annual Exam and skin changes   OSA on CPAP and 1L night time O2 Ongoing skin problems. Mom goes to [facility] [place] - if derm needed, would want this dermatologist. Clobetasol helps temporarily. She does moisturize regularly.   Gout - R knee improved since colchicine started daily.    Hearing Screening   125Hz  250Hz  500Hz  1000Hz  2000Hz  3000Hz  4000Hz  6000Hz  8000Hz   Right ear:    0 40  0    Left ear:    40 40  40    Vision Screening Comments: Seen an eye doctor in December.  Denies mood issues - depression/anhedonia/sadness No falls in the past year.   Preventative: Regular periods - LMP early July Well woman - discussed with mom. Doesn't want pap smear or pelvic exam or breast exam.  Mammogram WNL 04/2015.  Flu shot - yearly  Pneumovax 2013.  Tdap - 01/2013.  Advanced directives: mom would be HCPOA but this has not been set up yet - mom will work on this. Has paperwork at home.  Seat belt use discussed. Sunscreen use discussed. No changing moles on skin.  Non smoker Alcohol - none Mother hasn't noticed any trouble with memory.  Lives with mom and dad, dogs Stays at daycare during the day Activity: walking some Diet: good water, fruits/vegetables daily  Relevant past medical, surgical, family and social history reviewed and updated as indicated. Interim medical history since our last visit reviewed. Allergies and medications reviewed and updated. Outpatient Medications Prior to Visit  Medication Sig Dispense Refill  . albuterol (PROVENTIL) (2.5 MG/3ML) 0.083% nebulizer solution Take 3 mLs (2.5 mg total) by nebulization every 6 (six) hours as needed for wheezing  or shortness of breath. 75 mL 0  . clobetasol cream (TEMOVATE) 0.05 % APPLY TO AFFECTED AREA TWO TIMES A DAY. (Patient taking differently: APPLY TO AFFECTED AREA TWO TIMES A DAY prn) 60 g 0  . levETIRAcetam (KEPPRA) 500 MG tablet TAKE 1 TABLET (500 MG TOTAL) BY MOUTH EVERY 12 (TWELVE) HOURS. 180 tablet 3  . levothyroxine (SYNTHROID, LEVOTHROID) 125 MCG tablet TAKE 1 TABLET BY MOUTH DAILY BEFORE BREAKFAST. 90 tablet 4  . meloxicam (MOBIC) 15 MG tablet Take 15 mg by mouth daily.    . memantine (NAMENDA) 10 MG tablet Take 1 tablet (10 mg total) by mouth 2 (two) times daily. 60 tablet 11  . Multiple Vitamins-Minerals (WOMENS MULTI PO) Take 1 tablet by mouth daily.     . mupirocin ointment (BACTROBAN) 2 % APPLY TWICE DAILY. 22 g 0  . Probiotic Product (PROBIOTIC PO) Take 1 tablet by mouth daily.    . colchicine 0.6 MG tablet Take 1 tablet (0.6 mg total) by mouth daily as needed (gout flare). 30 tablet 1  . omeprazole (PRILOSEC) 40 MG capsule Take 1 capsule (40 mg total) by mouth daily. Take daily for 3 weeks then as needed 30 capsule 3  . hyoscyamine (LEVSIN SL) 0.125 MG SL tablet Place 1 tablet (0.125 mg total) under the tongue 3 (three) times daily before meals. (Patient not taking:  Reported on 09/08/2016) 60 tablet 1   No facility-administered medications prior to visit.      Per HPI unless specifically indicated in ROS section below Review of Systems     Objective:    BP 92/60   Pulse 62   Ht 4' 9.25" (1.454 m)   Wt 226 lb (102.5 kg)   LMP 09/01/2016   SpO2 98%   BMI 48.48 kg/m   Wt Readings from Last 3 Encounters:  09/08/16 226 lb (102.5 kg)  03/28/16 226 lb (102.5 kg)  12/25/15 225 lb (102.1 kg)    Physical Exam  Constitutional: She is oriented to person, place, and time. She appears well-developed and well-nourished. No distress.  HENT:  Head: Normocephalic and atraumatic.  Right Ear: Hearing, tympanic membrane, external ear and ear canal normal.  Left Ear: Hearing, tympanic  membrane, external ear and ear canal normal.  Nose: Nose normal.  Mouth/Throat: Uvula is midline, oropharynx is clear and moist and mucous membranes are normal. No oropharyngeal exudate, posterior oropharyngeal edema or posterior oropharyngeal erythema.  Scaling throughout scalp Erythematous margin posterior ear Cerumen bilateral canals  Eyes: Pupils are equal, round, and reactive to light. Conjunctivae and EOM are normal. No scleral icterus.  Neck: Normal range of motion. Neck supple.  Cardiovascular: Normal rate, regular rhythm, normal heart sounds and intact distal pulses.   No murmur heard. Pulses:      Radial pulses are 2+ on the right side, and 2+ on the left side.  Pulmonary/Chest: Effort normal and breath sounds normal. No respiratory distress. She has no wheezes. She has no rales.  Abdominal: Soft. Bowel sounds are normal. She exhibits no distension and no mass. There is no tenderness. There is no rebound and no guarding.  Musculoskeletal: Normal range of motion. She exhibits no edema.  Lymphadenopathy:    She has no cervical adenopathy.  Neurological: She is alert and oriented to person, place, and time.  CN grossly intact, station and gait intact  Skin: Skin is warm and dry. Rash noted.  Patch of dry hyperkeratotic excoriated skin L dorsal hand Second smaller patch of dry skin L lower leg  Psychiatric: She has a normal mood and affect. Her behavior is normal. Judgment and thought content normal.  Bright affect today  Nursing note and vitals reviewed.  Results for orders placed or performed in visit on 08/29/16  Comprehensive metabolic panel  Result Value Ref Range   Sodium 138 135 - 145 mEq/L   Potassium 4.3 3.5 - 5.1 mEq/L   Chloride 103 96 - 112 mEq/L   CO2 31 19 - 32 mEq/L   Glucose, Bld 119 (H) 70 - 99 mg/dL   BUN 15 6 - 23 mg/dL   Creatinine, Ser 4.09 0.40 - 1.20 mg/dL   Total Bilirubin 0.3 0.2 - 1.2 mg/dL   Alkaline Phosphatase 46 39 - 117 U/L   AST 26 0 - 37  U/L   ALT 35 0 - 35 U/L   Total Protein 7.1 6.0 - 8.3 g/dL   Albumin 3.7 3.5 - 5.2 g/dL   Calcium 8.9 8.4 - 81.1 mg/dL   GFR 91.47 (L) >82.95 mL/min  TSH  Result Value Ref Range   TSH 1.16 0.35 - 4.50 uIU/mL  Hemoglobin A1c  Result Value Ref Range   Hgb A1c MFr Bld 5.4 4.6 - 6.5 %  CBC with Differential/Platelet  Result Value Ref Range   WBC 4.7 4.0 - 10.5 K/uL   RBC 4.10 3.87 - 5.11  Mil/uL   Hemoglobin 14.2 12.0 - 15.0 g/dL   HCT 40.9 81.1 - 91.4 %   MCV 104.1 (H) 78.0 - 100.0 fl   MCHC 33.4 30.0 - 36.0 g/dL   RDW 78.2 95.6 - 21.3 %   Platelets 183.0 150.0 - 400.0 K/uL   Neutrophils Relative % 62.6 43.0 - 77.0 %   Lymphocytes Relative 18.7 12.0 - 46.0 %   Monocytes Relative 11.1 3.0 - 12.0 %   Eosinophils Relative 4.8 0.0 - 5.0 %   Basophils Relative 2.8 0.0 - 3.0 %   Neutro Abs 2.9 1.4 - 7.7 K/uL   Lymphs Abs 0.9 0.7 - 4.0 K/uL   Monocytes Absolute 0.5 0.1 - 1.0 K/uL   Eosinophils Absolute 0.2 0.0 - 0.7 K/uL   Basophils Absolute 0.1 0.0 - 0.1 K/uL  Folate  Result Value Ref Range   Folate 17.6 >5.9 ng/mL  Vitamin B12  Result Value Ref Range   Vitamin B-12 482 211 - 911 pg/mL      Assessment & Plan:   Problem List Items Addressed This Visit    Advanced care planning/counseling discussion    Advanced directives: mom would be HCPOA but this has not been set up yet - mom will work on this. Has paperwork at home.       Chronic gout    Found to have gout on knee aspiration, started on colchicine. Will start allopurinol 100mg  daily with option to increase as needed, in hopes of titrating off colchicine and keeping PRN. Discussed possible precipitation of gout flare with allopurinol commencement. Mom agrees with plan.      Down syndrome    Stable period. Goes to adult day center.      Dry scalp    Anticipate seborrheic dermatitis. Treat with nizoral shampoo and TCI foam PRN.       Dyshidrotic eczema    Ongoing hand eczema. Discussed skin care precautions.  Suggested clobetasol treatment x 2 weeks straight then discontinue.       Hypothyroid    Chronic, stable. Continue current regimen.      Macrocytosis without anemia    Chronic, ongoing, possibly related to down syndrome. Consider periph smear next labs as recent worsening noted.       MDD (major depressive disorder), recurrent episode, moderate (HCC)    Stable period off medication. Sunny disposition today.       Medicare annual wellness visit, subsequent - Primary    I have personally reviewed the Medicare Annual Wellness questionnaire and have noted 1. The patient's medical and social history 2. Their use of alcohol, tobacco or illicit drugs 3. Their current medications and supplements 4. The patient's functional ability including ADL's, fall risks, home safety risks and hearing or visual impairment. Cognitive function has been assessed and addressed as indicated.  5. Diet and physical activity 6. Evidence for depression or mood disorders The patients weight, height, BMI have been recorded in the chart. I have made referrals, counseling and provided education to the patient based on review of the above and I have provided the pt with a written personalized care plan for preventive services. Provider list updated.. See scanned questionairre as needed for further documentation. Reviewed preventative protocols and updated unless pt declined.       Morbid obesity with BMI of 45.0-49.9, adult (HCC)    Continue to encourage weight loss through healthy diet and lifestyle changes. She enjoys swimming at home pool, swims about twice a week.  OSA on CPAP    Continue CPAP and nocturnal oxygen      Prediabetes    A1c improved, cbg deteriorated. Continue to encourage weight loss.       Presenile dementia, uncomplicated    Stable period. No increased level of care needs identified. Continue namenda 10mg  bid.       Seizure (HCC)    Isolated seizures x2 during hospitalization for  acute illness 2013 Stable period on keppra.           Follow up plan: Return in about 1 year (around 09/08/2017) for annual exam, prior fasting for blood work.  Eustaquio BoydenJavier Jasmine Maceachern, MD

## 2016-09-08 NOTE — Assessment & Plan Note (Signed)
Stable period. Goes to adult day center.

## 2016-09-08 NOTE — Assessment & Plan Note (Signed)
Found to have gout on knee aspiration, started on colchicine. Will start allopurinol 100mg  daily with option to increase as needed, in hopes of titrating off colchicine and keeping PRN. Discussed possible precipitation of gout flare with allopurinol commencement. Mom agrees with plan.

## 2016-09-08 NOTE — Assessment & Plan Note (Addendum)
Chronic, ongoing, possibly related to down syndrome. Consider periph smear next labs as recent worsening noted.

## 2016-09-08 NOTE — Assessment & Plan Note (Signed)
Continue to encourage weight loss through healthy diet and lifestyle changes. She enjoys swimming at home pool, swims about twice a week.

## 2016-09-08 NOTE — Patient Instructions (Addendum)
Schedule mammogram at your convenience.  Work on advanced directive when you have time.  Try clobetasol cream to hand rash for 1-2 weeks at a time. Try steroid foam/shampoo for scalp along with nizoral antifungal shampoo.  Start allopurinol '100mg'$  daily for gout. After 1-2 weeks may try to taper off colchicine, and use only as needed for gout flares.  You are doing well today.  Return as needed or in 1 year for next physical.  Health Maintenance, Female Adopting a healthy lifestyle and getting preventive care can go a long way to promote health and wellness. Talk with your health care provider about what schedule of regular examinations is right for you. This is a good chance for you to check in with your provider about disease prevention and staying healthy. In between checkups, there are plenty of things you can do on your own. Experts have done a lot of research about which lifestyle changes and preventive measures are most likely to keep you healthy. Ask your health care provider for more information. Weight and diet Eat a healthy diet  Be sure to include plenty of vegetables, fruits, low-fat dairy products, and lean protein.  Do not eat a lot of foods high in solid fats, added sugars, or salt.  Get regular exercise. This is one of the most important things you can do for your health. ? Most adults should exercise for at least 150 minutes each week. The exercise should increase your heart rate and make you sweat (moderate-intensity exercise). ? Most adults should also do strengthening exercises at least twice a week. This is in addition to the moderate-intensity exercise.  Maintain a healthy weight  Body mass index (BMI) is a measurement that can be used to identify possible weight problems. It estimates body fat based on height and weight. Your health care provider can help determine your BMI and help you achieve or maintain a healthy weight.  For females 32 years of age and older: ? A  BMI below 18.5 is considered underweight. ? A BMI of 18.5 to 24.9 is normal. ? A BMI of 25 to 29.9 is considered overweight. ? A BMI of 30 and above is considered obese.  Watch levels of cholesterol and blood lipids  You should start having your blood tested for lipids and cholesterol at 46 years of age, then have this test every 5 years.  You may need to have your cholesterol levels checked more often if: ? Your lipid or cholesterol levels are high. ? You are older than 46 years of age. ? You are at high risk for heart disease.  Cancer screening Lung Cancer  Lung cancer screening is recommended for adults 1-82 years old who are at high risk for lung cancer because of a history of smoking.  A yearly low-dose CT scan of the lungs is recommended for people who: ? Currently smoke. ? Have quit within the past 15 years. ? Have at least a 30-pack-year history of smoking. A pack year is smoking an average of one pack of cigarettes a day for 1 year.  Yearly screening should continue until it has been 15 years since you quit.  Yearly screening should stop if you develop a health problem that would prevent you from having lung cancer treatment.  Breast Cancer  Practice breast self-awareness. This means understanding how your breasts normally appear and feel.  It also means doing regular breast self-exams. Let your health care provider know about any changes, no matter how small.  If you are in your 20s or 30s, you should have a clinical breast exam (CBE) by a health care provider every 1-3 years as part of a regular health exam.  If you are 81 or older, have a CBE every year. Also consider having a breast X-ray (mammogram) every year.  If you have a family history of breast cancer, talk to your health care provider about genetic screening.  If you are at high risk for breast cancer, talk to your health care provider about having an MRI and a mammogram every year.  Breast cancer gene  (BRCA) assessment is recommended for women who have family members with BRCA-related cancers. BRCA-related cancers include: ? Breast. ? Ovarian. ? Tubal. ? Peritoneal cancers.  Results of the assessment will determine the need for genetic counseling and BRCA1 and BRCA2 testing.  Cervical Cancer Your health care provider may recommend that you be screened regularly for cancer of the pelvic organs (ovaries, uterus, and vagina). This screening involves a pelvic examination, including checking for microscopic changes to the surface of your cervix (Pap test). You may be encouraged to have this screening done every 3 years, beginning at age 82.  For women ages 103-65, health care providers may recommend pelvic exams and Pap testing every 3 years, or they may recommend the Pap and pelvic exam, combined with testing for human papilloma virus (HPV), every 5 years. Some types of HPV increase your risk of cervical cancer. Testing for HPV may also be done on women of any age with unclear Pap test results.  Other health care providers may not recommend any screening for nonpregnant women who are considered low risk for pelvic cancer and who do not have symptoms. Ask your health care provider if a screening pelvic exam is right for you.  If you have had past treatment for cervical cancer or a condition that could lead to cancer, you need Pap tests and screening for cancer for at least 20 years after your treatment. If Pap tests have been discontinued, your risk factors (such as having a new sexual partner) need to be reassessed to determine if screening should resume. Some women have medical problems that increase the chance of getting cervical cancer. In these cases, your health care provider may recommend more frequent screening and Pap tests.  Colorectal Cancer  This type of cancer can be detected and often prevented.  Routine colorectal cancer screening usually begins at 46 years of age and continues  through 46 years of age.  Your health care provider may recommend screening at an earlier age if you have risk factors for colon cancer.  Your health care provider may also recommend using home test kits to check for hidden blood in the stool.  A small camera at the end of a tube can be used to examine your colon directly (sigmoidoscopy or colonoscopy). This is done to check for the earliest forms of colorectal cancer.  Routine screening usually begins at age 14.  Direct examination of the colon should be repeated every 5-10 years through 46 years of age. However, you may need to be screened more often if early forms of precancerous polyps or small growths are found.  Skin Cancer  Check your skin from head to toe regularly.  Tell your health care provider about any new moles or changes in moles, especially if there is a change in a mole's shape or color.  Also tell your health care provider if you have a mole that is  larger than the size of a pencil eraser.  Always use sunscreen. Apply sunscreen liberally and repeatedly throughout the day.  Protect yourself by wearing long sleeves, pants, a wide-brimmed hat, and sunglasses whenever you are outside.  Heart disease, diabetes, and high blood pressure  High blood pressure causes heart disease and increases the risk of stroke. High blood pressure is more likely to develop in: ? People who have blood pressure in the high end of the normal range (130-139/85-89 mm Hg). ? People who are overweight or obese. ? People who are African American.  If you are 74-54 years of age, have your blood pressure checked every 3-5 years. If you are 26 years of age or older, have your blood pressure checked every year. You should have your blood pressure measured twice-once when you are at a hospital or clinic, and once when you are not at a hospital or clinic. Record the average of the two measurements. To check your blood pressure when you are not at a  hospital or clinic, you can use: ? An automated blood pressure machine at a pharmacy. ? A home blood pressure monitor.  If you are between 37 years and 6 years old, ask your health care provider if you should take aspirin to prevent strokes.  Have regular diabetes screenings. This involves taking a blood sample to check your fasting blood sugar level. ? If you are at a normal weight and have a low risk for diabetes, have this test once every three years after 46 years of age. ? If you are overweight and have a high risk for diabetes, consider being tested at a younger age or more often. Preventing infection Hepatitis B  If you have a higher risk for hepatitis B, you should be screened for this virus. You are considered at high risk for hepatitis B if: ? You were born in a country where hepatitis B is common. Ask your health care provider which countries are considered high risk. ? Your parents were born in a high-risk country, and you have not been immunized against hepatitis B (hepatitis B vaccine). ? You have HIV or AIDS. ? You use needles to inject street drugs. ? You live with someone who has hepatitis B. ? You have had sex with someone who has hepatitis B. ? You get hemodialysis treatment. ? You take certain medicines for conditions, including cancer, organ transplantation, and autoimmune conditions.  Hepatitis C  Blood testing is recommended for: ? Everyone born from [birth year] through 1965. ? Anyone with known risk factors for hepatitis C.  Sexually transmitted infections (STIs)  You should be screened for sexually transmitted infections (STIs) including gonorrhea and chlamydia if: ? You are sexually active and are younger than 46 years of age. ? You are older than 46 years of age and your health care provider tells you that you are at risk for this type of infection. ? Your sexual activity has changed since you were last screened and you are at an increased risk for chlamydia or  gonorrhea. Ask your health care provider if you are at risk.  If you do not have HIV, but are at risk, it may be recommended that you take a prescription medicine daily to prevent HIV infection. This is called pre-exposure prophylaxis (PrEP). You are considered at risk if: ? You are sexually active and do not regularly use condoms or know the HIV status of your partner(s). ? You take drugs by injection. ? You are sexually active  with a partner who has HIV.  Talk with your health care provider about whether you are at high risk of being infected with HIV. If you choose to begin PrEP, you should first be tested for HIV. You should then be tested every 3 months for as long as you are taking PrEP. Pregnancy  If you are premenopausal and you may become pregnant, ask your health care provider about preconception counseling.  If you may become pregnant, take 400 to 800 micrograms (mcg) of folic acid every day.  If you want to prevent pregnancy, talk to your health care provider about birth control (contraception). Osteoporosis and menopause  Osteoporosis is a disease in which the bones lose minerals and strength with aging. This can result in serious bone fractures. Your risk for osteoporosis can be identified using a bone density scan.  If you are 87 years of age or older, or if you are at risk for osteoporosis and fractures, ask your health care provider if you should be screened.  Ask your health care provider whether you should take a calcium or vitamin D supplement to lower your risk for osteoporosis.  Menopause may have certain physical symptoms and risks.  Hormone replacement therapy may reduce some of these symptoms and risks. Talk to your health care provider about whether hormone replacement therapy is right for you. Follow these instructions at home:  Schedule regular health, dental, and eye exams.  Stay current with your immunizations.  Do not use any tobacco products including  cigarettes, chewing tobacco, or electronic cigarettes.  If you are pregnant, do not drink alcohol.  If you are breastfeeding, limit how much and how often you drink alcohol.  Limit alcohol intake to no more than 1 drink per day for nonpregnant women. One drink equals 12 ounces of beer, 5 ounces of wine, or 1 ounces of hard liquor.  Do not use street drugs.  Do not share needles.  Ask your health care provider for help if you need support or information about quitting drugs.  Tell your health care provider if you often feel depressed.  Tell your health care provider if you have ever been abused or do not feel safe at home. This information is not intended to replace advice given to you by your health care provider. Make sure you discuss any questions you have with your health care provider. Document Released: 08/29/2010 Document Revised: 07/22/2015 Document Reviewed: 11/17/2014 Elsevier Interactive Patient Education  [employer].

## 2016-09-08 NOTE — Assessment & Plan Note (Addendum)
Isolated seizures x2 during hospitalization for acute illness 2013 Stable period on keppra.

## 2016-09-08 NOTE — Assessment & Plan Note (Signed)

## 2016-09-08 NOTE — Assessment & Plan Note (Signed)
Stable period off medication. Sunny disposition today.

## 2016-09-09 ENCOUNTER — Ambulatory Visit (HOSPITAL_COMMUNITY)
Admission: EM | Admit: 2016-09-09 | Discharge: 2016-09-09 | Disposition: A | Discharge status: Home / Self Care | Payer: Medicare Other | Attending: Internal Medicine | Admitting: Internal Medicine

## 2016-09-09 ENCOUNTER — Encounter (HOSPITAL_COMMUNITY): Payer: Self-pay | Admitting: *Deleted

## 2016-09-09 ENCOUNTER — Ambulatory Visit (INDEPENDENT_AMBULATORY_CARE_PROVIDER_SITE_OTHER): Payer: Medicare Other

## 2016-09-09 DIAGNOSIS — R05 Cough: Secondary | ICD-10-CM

## 2016-09-09 DIAGNOSIS — J40 Bronchitis, not specified as acute or chronic: Secondary | ICD-10-CM

## 2016-09-09 DIAGNOSIS — R059 Cough, unspecified: Secondary | ICD-10-CM

## 2016-09-09 MED ORDER — AZITHROMYCIN 250 MG PO TABS: 250.0000 mg | ORAL_TABLET | Freq: Every day | ORAL | 0 refills | Status: DC

## 2016-09-09 NOTE — ED Notes (Signed)
[facility]

## 2016-09-09 NOTE — ED Provider Notes (Signed)
CSN: [id number]     Arrival date & time 09/09/16  1711 History   First MD Initiated Contact with Patient 09/09/16 1815     Chief Complaint  Patient presents with  . Cough   (Consider location/radiation/quality/duration/timing/severity/associated sxs/prior Treatment) 46 year old female with Down syndrome presents to the urgent care on the recommendation of PCP office nurse to have a chest x-ray. Chief complaint the patient is a cough for 4 days. No known fever. The patient's caregiver states is difficult to take the patient's temperature due to oral anatomy and uncooperation. She has a history of hospitalizations due to pneumonia and severe effusions.      Past Medical History:  Diagnosis Date  . Dementia due to another medical condition    Trisomy 21-related, sees Pearlean BrownieSethi yearly, nl CT head 2010, did not tolerate aricept  . Depression    with OCD  . Diabetes mellitus (HCC)    "just when she was on TPN"  . DJD (degenerative joint disease) of knee    bilat knee arthritis, had 3/3 Supartz injections [name](Kramer)  . Down syndrome   . 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 Eczema   . Empyema of left pleural space (HCC) 09/24/2011  . Hypothyroid   . OCD (obsessive compulsive disorder)   . On home oxygen therapy    "1L just at night" (09/25/2014)  . OSA on CPAP 05/2012   severe OSA with AHI 118, desat nadir to 67%  . Pneumonia    multiple pneumonias  . Presenile dementia, uncomplicated   . Seizure (HCC) 2013   x 2 ; "very mild"; during hospitalization "while coming off vent"  . Sleep-related hypoventilation 09/23/2012   Past Surgical History:  Procedure Laterality Date  . ANTERIOR CRUCIATE LIGAMENT REPAIR Left   . BACK SURGERY    . FLEXIBLE BRONCHOSCOPY  09/24/2011   Procedure: FLEXIBLE BRONCHOSCOPY;  Surgeon: Purcell Nailslarence H Owen, MD;  [facility];  Service: Thoracic;  Laterality: N/A;  . LUMBAR DISC SURGERY     ruptured disc  . TRACHEOSTOMY TUBE PLACEMENT  10/11/2011   Procedure: TRACHEOSTOMY;  Surgeon:  Serena ColonelJefry Rosen, MD;  [facility];  Service: ENT;  Laterality: N/A;  . VIDEO ASSISTED THORACOSCOPY (VATS) /THOROCOTOMY/RADIOACTIVE SEED IMPLANT Left 09/24/2011   VATS, (Dr. Barry Dieneswens)   Family History  Problem Relation Age of Onset  . Arthritis Mother   . Hypertension Father   . Diabetes Father   . Cancer Neg Hx   . Coronary artery disease Neg Hx   . Stroke Neg Hx    Social History  Substance Use Topics  . Smoking status: Never Smoker  . Smokeless tobacco: Never Used  . Alcohol use No   OB History    No data available     Review of Systems  Unable to perform ROS: Patient nonverbal  Constitutional: Negative for activity change.  Respiratory: Positive for cough.   All other systems reviewed and are negative.   Allergies  Patient has no known allergies.  Home Medications   Prior to Admission medications   Medication Sig Start Date End Date Taking? Authorizing Provider  albuterol (PROVENTIL) (2.5 MG/3ML) 0.083% nebulizer solution Take 3 mLs (2.5 mg total) by nebulization every 6 (six) hours as needed for wheezing or shortness of breath. 04/19/15  Yes Doreene Nestlark, Katherine K, NP  colchicine 0.6 MG tablet Take 1 tablet (0.6 mg total) by mouth daily as needed (gout flare). 09/08/16  Yes Eustaquio BoydenGutierrez, Javier, MD  levETIRAcetam (KEPPRA) 500 MG tablet TAKE 1 TABLET (  500 MG TOTAL) BY MOUTH EVERY 12 (TWELVE) HOURS. 12/15/15  Yes Nilda Riggs, NP  levothyroxine (SYNTHROID, LEVOTHROID) 125 MCG tablet TAKE 1 TABLET BY MOUTH DAILY BEFORE BREAKFAST. 02/15/16  Yes Eustaquio Boyden, MD  meloxicam (MOBIC) 15 MG tablet Take 15 mg by mouth daily.   Yes [provider]  memantine (NAMENDA) 10 MG tablet Take 1 tablet (10 mg total) by mouth 2 (two) times daily. 12/15/15  Yes Nilda Riggs, NP  Multiple Vitamins-Minerals (WOMENS MULTI PO) Take 1 tablet by mouth daily.    Yes [provider]  mupirocin ointment (BACTROBAN) 2 % APPLY TWICE DAILY. 07/18/16  Yes Eustaquio Boyden,  MD  OXYGEN Inhale 1 L into the lungs at bedtime. Via CPAP   Yes [provider]  Probiotic Product (PROBIOTIC PO) Take 1 tablet by mouth daily.   Yes [provider]  azithromycin (ZITHROMAX) 250 MG tablet Take 1 tablet (250 mg total) by mouth daily. 2 tabs po on day one, then one tablet po once daily on days 2-5. 09/09/16   Hayden Rasmussen, NP   Meds Ordered and Administered this Visit  Medications - No data to display  BP 94/60   Pulse 62   Temp 98.5 F (36.9 C) (Oral)   Resp (!) 24   LMP 09/01/2016 (Exact Date)   SpO2 95%  No data found.   Physical Exam  Constitutional: She is oriented to person, place, and time. She appears well-developed and well-nourished.  Eyes: EOM are normal.  Neck: Neck supple.  Cardiovascular: Normal rate, regular rhythm and normal heart sounds.   Pulmonary/Chest: Breath sounds normal. No respiratory distress. She has no wheezes. She has no rales.  Musculoskeletal: She exhibits no edema.  Neurological: She is alert and oriented to person, place, and time.  Skin: Skin is warm and dry. Capillary refill takes less than 2 seconds.  Nursing note and vitals reviewed.   Urgent Care Course     Procedures (including critical care time)  Labs Review Labs Reviewed - No data to display  Imaging Review Dg Chest 2 View  Result Date: 09/09/2016 CLINICAL DATA:  Cough EXAM: CHEST  2 VIEW COMPARISON:  09/25/2014 FINDINGS: Cardiomegaly. Mild peribronchial thickening and interstitial prominence, likely chronic interstitial lung disease or bronchitis. No confluent opacities or effusions. No acute bony abnormality. IMPRESSION: Cardiomegaly. Bronchitic changes versus chronic interstitial lung disease. Electronically Signed   By: Charlett Nose M.D.   On: 09/09/2016 17:53     Visual Acuity Review  Right Eye Distance:   Left Eye Distance:   Bilateral Distance:    Right Eye Near:   Left Eye Near:    Bilateral Near:         MDM   1. Cough   2.  Bronchitis    For development of fever, shortness of breath, worsening cough or other symptoms of poor respiratory function go to the emergency department. Otherwise, follow-up with your primary care provider. Meds ordered this encounter  Medications  . OXYGEN    Sig: Inhale 1 L into the lungs at bedtime. Via CPAP  . azithromycin (ZITHROMAX) 250 MG tablet    Sig: Take 1 tablet (250 mg total) by mouth daily. 2 tabs po on day one, then one tablet po once daily on days 2-5.    Dispense:  6 tablet    Refill:  0    Order Specific Question:   Supervising Provider    Answer:   Eustace Rideout [[id number]]  Antibiotic  prescribed due to mother's considerable concern for exacerbation of bronchitis or changed to pneumonia requiring hospitalization. X-ray was explained to patient's mother.     Hayden Rasmussen, NP 09/09/16 [phone]

## 2016-09-09 NOTE — ED Triage Notes (Signed)
States pt has had significant respiratory issues in past with lung surgery, hx of being on vent x 3 months, pneumonia; "goes from good to bad really fast".

## 2016-09-09 NOTE — ED Triage Notes (Signed)
C/O "tight" dry cough x 4 days.  Has had 2 neb treatments at home yesterday without significant relief.  Unsure if fevers.

## 2016-09-09 NOTE — Discharge Instructions (Signed)
For development of fever, shortness of breath, worsening cough or other symptoms of poor respiratory function go to the emergency department. Otherwise, follow-up with your primary care provider.

## 2016-09-11 ENCOUNTER — Telehealth: Payer: Self-pay

## 2016-09-11 NOTE — Telephone Encounter (Signed)
PLEASE NOTE: All timestamps contained within this report are represented as [place] Standard Time. CONFIDENTIALTY NOTICE: This fax transmission is intended only for the addressee. It contains information that is legally privileged, confidential or otherwise protected from use or disclosure. If you are not the intended recipient, you are strictly prohibited from reviewing, disclosing, copying using or disseminating any of this information or taking any action in reliance on or regarding this information. If you have received this fax in error, please notify us immediately by telephone so that we can arrange for its return to [place]. Phone: [phone], Toll-Free: [phone], Fax: [phone] Page: 1 of 2 Call Id: [id number] [facility] Night - Client TELEPHONE ADVICE RECORD [facility] Patient Name: [name] Gender: Female DOB: 1970-05-20 Age: 46 Y 11 M 27 D Return Phone Number: [phone] (Primary) City/State/Zip: [place] [address] Client [facility] Night - Client Client Site [facility] [place] - Night Physician Eustaquio Boyden - MD Who Is Calling Patient / Member / Family / Caregiver Call Type Triage / Clinical Caller Name [name] Relationship To Patient Mother Return Phone Number [phone] (Primary) Chief Complaint Swallowing Difficulty Reason for Call Symptomatic / Request for Health Information Initial Comment FAX: Caller states is having a hard time swallowing and she is coughing a lot. Nurse Assessment Nurse: Tillman Abide, RN, Debra Date/Time (Eastern Time): 09/08/2016 6:27:57 PM Confirm and document reason for call. If symptomatic, describe symptoms. ---caller states dtr is having a hard time swallowing and she is coughing a lot. started a few days ago. had md appt today but didn't give her anything for the cough. has freq pna, rds, and is currently on o2 at night. feels like is running temp. Does  the PT have any chronic conditions? (i.e. diabetes, asthma, etc.) ---Yes List chronic conditions. ---hx freq pna hx rds hx lung surgery hx of being on vent downs Is the patient pregnant or possibly pregnant? (Ask all females between the ages of 7-55) ---No Guidelines Guideline Title Affirmed Question Cough - Acute Non- Productive [1] Fever > 100.0 F (37.8 C) AND [2] diabetes mellitus or weak immune system (e.g., HIV positive, cancer chemo, splenectomy, chronic steroids) Disp. Time Lamount Cohen Time) Disposition Final User 09/08/2016 6:30:51 PM See Physician within 4 Hours (or PCP triage) Yes Tillman Abide, RN, Debra Referrals [facility] Care Advice Given Per Guideline SEE PHYSICIAN WITHIN 4 HOURS (or PCP triage): * IF OFFICE WILL BE CLOSED AND NO PCP TRIAGE: You need to be seen within the next 3 or 4 hours. A nearby Urgent Care Center is often a good source of care. Another choice is to go to the ER. Go sooner if you become worse. FEVER MEDICINE - ACETAMINOPHEN: * Fever above 101 F (38.3 C) should be treated with acetaminophen (e.g., Tylenol). This can be taken by mouth as pills or per rectum using a suppository. Both are available over the counter. Usual adult dose is 650 mg by mouth or per rectum every 6 hours. CALL BACK IF: * You become worse. CARE ADVICE given per Cough - Acute Non-Productive (Adult) guideline. PLEASE NOTE: All timestamps contained within this report are represented as [place] Standard Time. CONFIDENTIALTY NOTICE: This fax transmission is intended only for the addressee. It contains information that is legally privileged, confidential or otherwise protected from use or disclosure. If you are not the intended recipient, you are strictly prohibited from reviewing, disclosing, copying using or disseminating any  of this information or taking any action in reliance on or regarding this information. If you have received this fax in error,  please notify us immediately by telephone so that we can arrange for its return to us. Phone: [phone], Toll-Free: [phone], Fax: [phone] Page: 2 of 2 Call Id: [id number] Comments User: Mallory Shirkebra, Slagle, RN Date/Time Lamount Cohen(Eastern Time): 09/08/2016 6:29:34 PM acts like has sore throat. not really having difficulty swallowing.

## 2016-09-11 NOTE — Telephone Encounter (Signed)
Noted. Treated with zpack for possible bronchitis.

## 2016-09-11 NOTE — Telephone Encounter (Signed)
Per chart review tab pt was seen [facility] on 09/09/16.

## 2016-09-17 NOTE — Progress Notes (Signed)
   SUBJECTIVE Patient  presents to office today complaining of elongated, thickened nails. Pain while ambulating in shoes. Patient is unable to trim their own nails.   OBJECTIVE General Patient is awake, alert, and oriented x 3 and in no acute distress. Derm Skin is dry and supple bilateral. Negative open lesions or macerations. Remaining integument unremarkable. Nails are tender, long, thickened and dystrophic with subungual debris, consistent with onychomycosis, 1-5 bilateral. No signs of infection noted. Vasc  DP and PT pedal pulses palpable bilaterally. Temperature gradient within normal limits.  Neuro Epicritic and protective threshold sensation diminished bilaterally.  Musculoskeletal Exam No symptomatic pedal deformities noted bilateral. Muscular strength within normal limits.  ASSESSMENT 1. Onychodystrophic nails 1-5 bilateral with hyperkeratosis of nails.  2. Onychomycosis of nail due to dermatophyte bilateral 3. Pain in foot bilateral  PLAN OF CARE 1. Patient evaluated today.  2. Instructed to maintain good pedal hygiene and foot care.  3. Mechanical debridement of nails 1-5 bilaterally performed using a nail nipper. Filed with dremel without incident.  4. Return to clinic in 3 mos.    Jennice Renegar M. Kourtnei Rauber, DPM [facility]  Dr. Beulah Capobianco M. Dylanie Quesenberry, DPM    [address]                                        [place], [place] [address]                Office [phone]  Fax [phone]      

## 2016-09-19 ENCOUNTER — Telehealth: Payer: Self-pay | Admitting: Family Medicine

## 2016-09-19 ENCOUNTER — Ambulatory Visit (INDEPENDENT_AMBULATORY_CARE_PROVIDER_SITE_OTHER): Payer: Medicare Other | Admitting: Family Medicine

## 2016-09-19 ENCOUNTER — Encounter: Payer: Self-pay | Admitting: Family Medicine

## 2016-09-19 VITALS — BP 110/78 | HR 58 | Temp 98.4°F | Resp 24 | Wt 223.0 lb

## 2016-09-19 DIAGNOSIS — H60331 Swimmer's ear, right ear: Secondary | ICD-10-CM

## 2016-09-19 DIAGNOSIS — R05 Cough: Secondary | ICD-10-CM | POA: Diagnosis not present

## 2016-09-19 DIAGNOSIS — H6091 Unspecified otitis externa, right ear: Secondary | ICD-10-CM | POA: Insufficient documentation

## 2016-09-19 DIAGNOSIS — R059 Cough, unspecified: Secondary | ICD-10-CM

## 2016-09-19 MED ORDER — AMOXICILLIN 500 MG PO CAPS: 500.0000 mg | ORAL_CAPSULE | Freq: Three times a day (TID) | ORAL | 0 refills | Status: DC

## 2016-09-19 MED ORDER — ALBUTEROL SULFATE (2.5 MG/3ML) 0.083% IN NEBU: 2.5000 mg | INHALATION_SOLUTION | Freq: Four times a day (QID) | RESPIRATORY_TRACT | 5 refills | Status: DC | PRN

## 2016-09-19 MED ORDER — AMOXICILLIN-POT CLAVULANATE 875-125 MG PO TABS: 1.0000 | ORAL_TABLET | Freq: Two times a day (BID) | ORAL | 0 refills | Status: AC

## 2016-09-19 NOTE — Telephone Encounter (Signed)
Patient Name: [name]  DOB: June 20, 1970    Initial Comment Caller states dtr was seen for a physical, not sure as to the date, she was coughing, next day to urgent care with bronchitis, put on z-pak. Special needs, been in the hospital, given breathing treatments with nebulizer 3 times a day. She is wondering about an appt for today, next available appt is tomorrow at 11:30am. Dtr is special needs.    Nurse Assessment  Nurse: Scarlette ArStandifer, RN, Heather Date/Time (Eastern Time): 09/19/2016 9:52:22 AM  Confirm and document reason for call. If symptomatic, describe symptoms. ---Caller states that her daughter started with a cough over a week ago and she was seen at the urgent care on 07/14 and she was given antibiotics. she is coughing a lot still.  Does the patient have any new or worsening symptoms? ---Yes  Will a triage be completed? ---Yes  Related visit to physician within the last 2 weeks? ---No  Does the PT have any chronic conditions? (i.e. diabetes, asthma, etc.) ---Yes  List chronic conditions. ---See MR  Is the patient pregnant or possibly pregnant? (Ask all females between the ages of 3412-55) ---No  Is this a behavioral health or substance abuse call? ---No     Guidelines    Guideline Title Affirmed Question Affirmed Notes  Cough - Acute Non-Productive SEVERE coughing spells (e.g., whooping sound after coughing, vomiting after coughing)    Final Disposition User   See Physician within 24 Hours Standifer, RN, Insurance underwriterHeather    Comments  Caller only wants her daughter to see Dr. Reece AgarG. No appts until tomorrow after 24 hours. Caller would like her daughter to be worked in today with Dr. Reece AgarG if possible.   Referrals  GO TO FACILITY UNDECIDED   Disagree/Comply: Comply

## 2016-09-19 NOTE — Progress Notes (Signed)
BP 110/78 (BP Location: Left Arm, Patient Position: Sitting, Cuff Size: Large)   Pulse (!) 58   Temp 98.4 F (36.9 C) (Oral)   Resp (!) 24   Wt 223 lb (101.2 kg)   LMP 09/01/2016 (Exact Date)   SpO2 98%   BMI 47.84 kg/Sosa    CC: ongoing cough Subjective:    Patient ID: [name] Petite, female    DOB: [birth year], 46 y.o.   MRN: [id number]  HPI: [name] Nordell is a 46 y.o. female presenting on 09/19/2016 for Cough (Cough for about 2 weeks. [facility] Was given Zpak, breathing treatments. Still coughing. More so at night.)   2 wk h/o cough.   [facility], note reviewed. Dx bronchitis. Treated with zpack and breathing treatment. CXR at that time showed bronchitic changes vs chronic interstitial lung disease. She has been using albuterol nebulizer TID.   Ongoing dry cough (never productive). No fevers.  Acting herself.  No congestion or rhinorrhea. No earache or headache. No wheezing.  Endorses sore throat and chest discomfort. Mild dyspnea  She was swimming more regularly earlier in the month prior to sxs commencing. Stopped swimming on 09/11/2016. She has previously seen Dr Pollyann Kennedyosen.  H/o critical hospitalization with ICU stay with PNA and pleural effusions ~2015.   Mother recently ill with PNA that finally improved with levaquin abx.   Relevant past medical, surgical, family and social history reviewed and updated as indicated. Interim medical history since our last visit reviewed. Allergies and medications reviewed and updated. Outpatient Medications Prior to Visit  Medication Sig Dispense Refill  . colchicine 0.6 MG tablet Take 1 tablet (0.6 mg total) by mouth daily as needed (gout flare). 30 tablet 1  . levETIRAcetam (KEPPRA) 500 MG tablet TAKE 1 TABLET (500 MG TOTAL) BY MOUTH EVERY 12 (TWELVE) HOURS. 180 tablet 3  . levothyroxine (SYNTHROID, LEVOTHROID) 125 MCG tablet TAKE 1 TABLET BY MOUTH DAILY BEFORE BREAKFAST. 90 tablet 4  . meloxicam (MOBIC) 15 MG  tablet Take 15 mg by mouth daily.    . memantine (NAMENDA) 10 MG tablet Take 1 tablet (10 mg total) by mouth 2 (two) times daily. 60 tablet 11  . Multiple Vitamins-Minerals (WOMENS MULTI PO) Take 1 tablet by mouth daily.     . mupirocin ointment (BACTROBAN) 2 % APPLY TWICE DAILY. 22 g 0  . OXYGEN Inhale 1 L into the lungs at bedtime. Via CPAP    . Probiotic Product (PROBIOTIC PO) Take 1 tablet by mouth daily.    Marland Kitchen. albuterol (PROVENTIL) (2.5 MG/3ML) 0.083% nebulizer solution Take 3 mLs (2.5 mg total) by nebulization every 6 (six) hours as needed for wheezing or shortness of breath. 75 mL 0  . azithromycin (ZITHROMAX) 250 MG tablet Take 1 tablet (250 mg total) by mouth daily. 2 tabs po on day one, then one tablet po once daily on days 2-5. 6 tablet 0   No facility-administered medications prior to visit.     Past Medical History:  Diagnosis Date  . Dementia due to another medical condition    Trisomy 21-related, sees Pearlean BrownieSethi yearly, nl CT head 2010, did not tolerate aricept  . Depression    with OCD  . Diabetes mellitus (HCC)    "just when she was on TPN"  . DJD (degenerative joint disease) of knee    bilat knee arthritis, had 3/3 Supartz injections [name](Kramer)  . Down syndrome   . 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 Eczema   .  Empyema of left pleural space (HCC) 09/24/2011  . Hypothyroid   . OCD (obsessive compulsive disorder)   . On home oxygen therapy    "1L just at night" (09/25/2014)  . OSA on CPAP 05/2012   severe OSA with AHI 118, desat nadir to 67%  . Pneumonia    multiple pneumonias  . Presenile dementia, uncomplicated   . Seizure (HCC) 2013   x 2 ; "very mild"; during hospitalization "while coming off vent"  . Sleep-related hypoventilation 09/23/2012    Past Surgical History:  Procedure Laterality Date  . ANTERIOR CRUCIATE LIGAMENT REPAIR Left   . BACK SURGERY    . FLEXIBLE BRONCHOSCOPY  09/24/2011   Procedure: FLEXIBLE BRONCHOSCOPY;  Surgeon: Purcell Nails, MD;  Location[facility] OR;  Service:  Thoracic;  Laterality: N/A;  . LUMBAR DISC SURGERY     ruptured disc  . TRACHEOSTOMY TUBE PLACEMENT  10/11/2011   Procedure: TRACHEOSTOMY;  Surgeon: Serena Colonel, MD;  Location: [facility] OR;  Service: ENT;  Laterality: N/A;  . VIDEO ASSISTED THORACOSCOPY (VATS) /THOROCOTOMY/RADIOACTIVE SEED IMPLANT Left 09/24/2011   VATS, (Dr. Barry Dienes)    Per HPI unless specifically indicated in ROS section below Review of Systems     Objective:    BP 110/78 (BP Location: Left Arm, Patient Position: Sitting, Cuff Size: Large)   Pulse (!) 58   Temp 98.4 F (36.9 C) (Oral)   Resp (!) 24   Wt 223 lb (101.2 kg)   LMP 09/01/2016 (Exact Date)   SpO2 98%   BMI 47.84 kg/Sosa   Wt Readings from Last 3 Encounters:  09/19/16 223 lb (101.2 kg)  09/08/16 226 lb (102.5 kg)  03/28/16 226 lb (102.5 kg)    Physical Exam  Constitutional: She appears well-developed and well-nourished. No distress.  HENT:  Head: Normocephalic and atraumatic.  Right Ear: There is drainage and tenderness.  Left Ear: Tympanic membrane, external ear and ear canal normal.  Nose: No mucosal edema or rhinorrhea. Right sinus exhibits no maxillary sinus tenderness and no frontal sinus tenderness. Left sinus exhibits no maxillary sinus tenderness and no frontal sinus tenderness.  Mouth/Throat: Uvula is midline, oropharynx is clear and moist and mucous membranes are normal. No oropharyngeal exudate, posterior oropharyngeal edema, posterior oropharyngeal erythema or tonsillar abscesses.  Liquid cerumen dark brown in color throughout external canal, sensitive to exam, unable to visualize TM  Eyes: Pupils are equal, round, and reactive to light. Conjunctivae and EOM are normal. No scleral icterus.  Neck: Normal range of motion. Neck supple.  Cardiovascular: Normal rate, regular rhythm, normal heart sounds and intact distal pulses.   No murmur heard. Lungs largely clear  Pulmonary/Chest: Effort normal and breath sounds normal. No respiratory distress. She  has no wheezes. She has no rales.  Lymphadenopathy:       Head (right side): Tonsillar and posterior auricular adenopathy present. No submental, no submandibular, no preauricular and no occipital adenopathy present.       Head (left side): Occipital adenopathy present. No submental, no submandibular, no tonsillar, no preauricular and no posterior auricular adenopathy present.    She has no cervical adenopathy.  Post auricular and tonsillar tender soft tissue swelling without erythema or calor  Skin: Skin is warm and dry. No rash noted.  Nursing note and vitals reviewed.  Results for orders placed or performed in visit on 08/29/16  Comprehensive metabolic panel  Result Value Ref Range   Sodium 138 135 - 145 mEq/L   Potassium 4.3 3.5 - 5.1  mEq/L   Chloride 103 96 - 112 mEq/L   CO2 31 19 - 32 mEq/L   Glucose, Bld 119 (H) 70 - 99 mg/dL   BUN 15 6 - 23 mg/dL   Creatinine, Ser 0.86 0.40 - 1.20 mg/dL   Total Bilirubin 0.3 0.2 - 1.2 mg/dL   Alkaline Phosphatase 46 39 - 117 U/L   AST 26 0 - 37 U/L   ALT 35 0 - 35 U/L   Total Protein 7.1 6.0 - 8.3 g/dL   Albumin 3.7 3.5 - 5.2 g/dL   Calcium 8.9 8.4 - 57.8 mg/dL   GFR 46.96 (L) >29.52 mL/min  TSH  Result Value Ref Range   TSH 1.16 0.35 - 4.50 uIU/mL  Hemoglobin A1c  Result Value Ref Range   Hgb A1c MFr Bld 5.4 4.6 - 6.5 %  CBC with Differential/Platelet  Result Value Ref Range   WBC 4.7 4.0 - 10.5 K/uL   RBC 4.10 3.87 - 5.11 Mil/uL   Hemoglobin 14.2 12.0 - 15.0 g/dL   HCT 84.1 32.4 - 40.1 %   MCV 104.1 (H) 78.0 - 100.0 fl   MCHC 33.4 30.0 - 36.0 g/dL   RDW 02.7 25.3 - 66.4 %   Platelets 183.0 150.0 - 400.0 K/uL   Neutrophils Relative % 62.6 43.0 - 77.0 %   Lymphocytes Relative 18.7 12.0 - 46.0 %   Monocytes Relative 11.1 3.0 - 12.0 %   Eosinophils Relative 4.8 0.0 - 5.0 %   Basophils Relative 2.8 0.0 - 3.0 %   Neutro Abs 2.9 1.4 - 7.7 K/uL   Lymphs Abs 0.9 0.7 - 4.0 K/uL   Monocytes Absolute 0.5 0.1 - 1.0 K/uL   Eosinophils  Absolute 0.2 0.0 - 0.7 K/uL   Basophils Absolute 0.1 0.0 - 0.1 K/uL  Folate  Result Value Ref Range   Folate 17.6 >5.9 ng/mL  Vitamin B12  Result Value Ref Range   Vitamin B-12 482 211 - 911 pg/mL      Assessment & Plan:   Problem List Items Addressed This Visit    Cough    I think this may be reflexive cough from external ear canal irritation.       Relevant Medications   albuterol (PROVENTIL) (2.5 MG/3ML) 0.083% nebulizer solution   External otitis of right ear - Primary    Anticipate cause of malaise. Discussed with patient and mother. Unable to fully clear external canal to visualized TM. She did complete zpack course last week but persistent malaise, discomfort and swelling. Will treat with augmentin course to cover possible concomitant AOM.  Discussed aleve use for discomfort.       Relevant Orders   Ambulatory referral to ENT       Follow up plan: Return if symptoms worsen or fail to improve.  Eustaquio Boyden, MD

## 2016-09-19 NOTE — Telephone Encounter (Signed)
Dr Reece AgarG will see pt today at 4:30. Mrs Christell ConstantMoore notified and appt made.FYI to Dr Reece AgarG.

## 2016-09-19 NOTE — Assessment & Plan Note (Signed)
Anticipate cause of malaise. Discussed with patient and mother. Unable to fully clear external canal to visualized TM. She did complete zpack course last week but persistent malaise, discomfort and swelling. Will treat with augmentin course to cover possible concomitant AOM.  Discussed aleve use for discomfort.

## 2016-09-19 NOTE — Assessment & Plan Note (Signed)
I think this may be reflexive cough from external ear canal irritation.

## 2016-09-19 NOTE — Patient Instructions (Addendum)
I think this is coming from right ear - we will see if we can get you in with Dr Pollyann Kennedyosen tomorrow or Thursday.  Take 1 aleve twice daily with meals for discomfort.  Start antibiotic sent to pharmacy.  Let us know if fever or worsening pain despite the aleve.

## 2016-09-19 NOTE — Telephone Encounter (Signed)
Spoke with Sara Leeena

## 2016-09-29 DIAGNOSIS — H35373 Puckering of macula, bilateral: Secondary | ICD-10-CM | POA: Insufficient documentation

## 2016-11-22 ENCOUNTER — Other Ambulatory Visit: Payer: Self-pay | Admitting: Orthopedic Surgery

## 2016-11-22 DIAGNOSIS — M545 Low back pain: Secondary | ICD-10-CM

## 2016-11-27 ENCOUNTER — Encounter: Payer: Self-pay | Admitting: Family Medicine

## 2016-12-02 ENCOUNTER — Other Ambulatory Visit: Payer: Medicare Other

## 2016-12-10 ENCOUNTER — Other Ambulatory Visit: Payer: Medicare Other

## 2016-12-13 ENCOUNTER — Ambulatory Visit: Payer: Commercial Managed Care - HMO | Admitting: Nurse Practitioner

## 2016-12-13 ENCOUNTER — Ambulatory Visit
Admission: RE | Admit: 2016-12-13 | Discharge: 2016-12-13 | Disposition: A | Discharge status: Home / Self Care | Payer: Medicare Other | Source: Ambulatory Visit | Attending: Orthopedic Surgery | Admitting: Orthopedic Surgery

## 2016-12-13 DIAGNOSIS — M545 Low back pain: Secondary | ICD-10-CM

## 2016-12-13 NOTE — Progress Notes (Deleted)
[facility]  PATIENT: [name] DOB: 19-Feb-1971   REASON FOR VISIT: Follow-up for seizure disorder, [name] HISTORY FROM:    HISTORY OF PRESENT ILLNESS:UPDATE 12/15/2015 CM [name], 46 year old female returns for followup with her mother. She Sosa a history of Downs syndrome and  cognitive decline over several years as well as seizure disorder. She is currently well controlled on Keppra and she is on Namenda without side effects to either drug. She is overweight, gets little exercise, lives at home. She goes to adult daycare 5 days a week . She Sosa been having a lot of abdominal pain and diarrhea, she Sosa appointment with GI in December.  She occasionally Sosa behavior problems and was recently placed on Zoloft however it made her irritability worse and she is slowly titrating off this. She needs refills on Keppra and Namenda . She returns for reevaluation Update 10/14/16PS : She returns for follow-up after last visit a year ago. She is a complaint by sister who provides most of the history. She continues to do well and Sosa not had any breakthrough seizures now for a few years. Her memory and cognitive difficulties remain unchanged. She requires close supervision at home and does spend the day at the daycare center. She can feed dress herself and bathe herself. She is never left alone. There have been no issues with agitation, behavior, delusions or hallucinations or safety concerns. Her gait imbalance remained stable and she Sosa had no falls.  HISTORY: [name] is a 17 year year old Caucasian lady with lifelong history of Down`s syndrome and cognitive decline in last several years from dementia who is referred back to see need for a new problem of seizures. She had recent prolonged hospital stay at [facility] pneumonia with empyema and ventilator dependent respiratory failure. She underwent video assisted thoracotomy  for empyma drainage. She required prolonged antibiotics and was eventually required tracheostomy. She had 2 brief episodes of what sound like complex partial seizures one week apart in September 2013. Her mother who is present today was eyewitness to both episodes. She was described as staring and unresponsive not following commands with a distant look on her face lasting a few minutes followed by a period of disorientation and tiredness. In the second episode she had some clonic activity in both her upper extremities. She was started on Keppra 500 twice a day falling and abnormal EEG on 11/04/2011 and with showed sharp waves in the left occipital parietal region. She was initially off her Namenda and Prozac in the hospital stay and had some behavioral agitation but settled down after the medicines were restarted. She Sosa done well since discharge and Sosa not had any more seizures. She seems to be tolerating Keppra quite well without any side effects. She is finished a course of antibiotics and home physical and occupation therapy. She is back living at home with mom and plans to start attending adult daycare soon. She apparently did not have any brain imaging study done.  03/11/12: [name] returns for followup. She was last seen by Dr. Pearlean Sosa 12/11/11: She had prolonged hospitilization in Sept 2013 and had seizure events. She was started on Keppra at that time. No further seizure events. MRI of the brain 12/18/11 was normal. EEG was abnormal suggestive of mild focal irritability in right frontal and temporal regions but no definite epileptiform  activity is noted. ROS neg.    REVIEW OF SYSTEMS: Full 14  system review of systems performed and notable only for those listed, all others are neg:  Constitutional: neg  Cardiovascular: neg Ear/Nose/Throat: neg  Skin: neg Eyes: neg Respiratory: neg Gastroitestinal: Abdominal pain diarrhea Hematology/Lymphatic: neg  Endocrine:  neg Musculoskeletal:neg Allergy/Immunology: neg Neurological: neg Psychiatric: Agitation at times Sleep : neg   ALLERGIES: No Known Allergies  HOME MEDICATIONS: Outpatient Medications Prior to Visit  Medication Sig Dispense Refill  . albuterol (PROVENTIL) (2.5 MG/3ML) 0.083% nebulizer solution Take 3 mLs (2.5 mg total) by nebulization every 6 (six) hours as needed for wheezing or shortness of breath. 75 mL 5  . colchicine 0.6 MG tablet Take 1 tablet (0.6 mg total) by mouth daily as needed (gout flare). 30 tablet 1  . levETIRAcetam (KEPPRA) 500 MG tablet TAKE 1 TABLET (500 MG TOTAL) BY MOUTH EVERY 12 (TWELVE) HOURS. 180 tablet 3  . levothyroxine (SYNTHROID, LEVOTHROID) 125 MCG tablet TAKE 1 TABLET BY MOUTH DAILY BEFORE BREAKFAST. 90 tablet 4  . meloxicam (MOBIC) 15 MG tablet Take 15 mg by mouth daily.    . memantine (NAMENDA) 10 MG tablet Take 1 tablet (10 mg total) by mouth 2 (two) times daily. 60 tablet 11  . Multiple Vitamins-Minerals (WOMENS MULTI PO) Take 1 tablet by mouth daily.     . mupirocin ointment (BACTROBAN) 2 % APPLY TWICE DAILY. 22 g 0  . OXYGEN Inhale 1 L into the lungs at bedtime. Via CPAP    . Probiotic Product (PROBIOTIC PO) Take 1 tablet by mouth daily.     No facility-administered medications prior to visit.     PAST MEDICAL HISTORY: Past Medical History:  Diagnosis Date  . Dementia due to another medical condition    Trisomy 21-related, sees [name] yearly, nl CT head 2010, did not tolerate aricept  . Depression    with OCD  . Diabetes mellitus (HCC)    "just when she was on TPN"  . DJD (degenerative joint disease) of knee    bilat knee arthritis, had 3/3 Supartz injections [name])  . Down syndrome   . 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 Eczema   . Empyema of left pleural space (HCC) 09/24/2011  . Hypothyroid   . OCD (obsessive compulsive disorder)   . On home oxygen therapy    "1L just at night" (09/25/2014)  . OSA on CPAP 05/2012   severe OSA with AHI 118, desat  nadir to 67%  . Pneumonia    multiple pneumonias  . Presenile dementia, uncomplicated   . Seizure (HCC) 2013   x 2 ; "very mild"; during hospitalization "while coming off vent"  . Sleep-related hypoventilation 09/23/2012    PAST SURGICAL HISTORY: Past Surgical History:  Procedure Laterality Date  . ANTERIOR CRUCIATE LIGAMENT REPAIR Left   . BACK SURGERY    . FLEXIBLE BRONCHOSCOPY  09/24/2011   Procedure: FLEXIBLE BRONCHOSCOPY;  Surgeon: Purcell Nails, MD;  Location: [facility] OR;  Service: Thoracic;  Laterality: N/A;  . LUMBAR DISC SURGERY     ruptured disc  . TRACHEOSTOMY TUBE PLACEMENT  10/11/2011   Procedure: TRACHEOSTOMY;  Surgeon: Serena Colonel, MD;  Location[facility] OR;  Service: ENT;  Laterality: N/A;  . VIDEO ASSISTED THORACOSCOPY (VATS) /THOROCOTOMY/RADIOACTIVE SEED IMPLANT Left 09/24/2011   VATS, (Dr. Barry Dienes)    FAMILY HISTORY: Family History  Problem Relation Age of Onset  . Arthritis Mother   . Hypertension Father   . Diabetes Father   . Cancer Neg Hx   . Coronary artery disease Neg Hx   .  Stroke Neg Hx     SOCIAL HISTORY: Social History   Social History  . Marital status: Single    Spouse name: N/A  . Number of children: 0  . Years of education: N/A   Occupational History  . Not on file.   Social History Main Topics  . Smoking status: Never Smoker  . Smokeless tobacco: Never Used  . Alcohol use No  . Drug use: No  . Sexual activity: No   Other Topics Concern  . Not on file   Social History Narrative   Lives with mom and dad, dogs   Stays at daycare during the day   Activity: walking some   Diet: good water, fruits/vegetables daily     PHYSICAL EXAM  There were no vitals filed for this visit. There is no height or weight on file to calculate BMI. Generalized: Well developed, obese female in no acute distress with downs features  Head: normocephalic and atraumatic,.    Neck: Supple, no carotid bruits  Cardiac: Regular rate rhythm, no murmur   Neurological examination  Mentation: Alert . Follows all commands speech and language fluent but with childlike quality. Diminished attention, registration and recall. Cranial nerve II-XII: Pupils were equal round reactive to light extraocular movements with end gaze horizontal nystagmus , visual field were full on confrontational test. Facial sensation and strength were normal. hearing was intact to finger rubbing bilaterally. Uvula tongue midline. head turning and shoulder shrug and were normal and symmetric.Tongue protrusion into cheek strength was normal.  Motor: normal bulk and tone, full strength in the BUE, BLE, fine finger movements normal, no pronator drift. No focal weakness  Sensory: normal and symmetric to light touch, pinprick, and vibration  Coordination: finger-nose-finger, heel-to-shin bilaterally, no dysmetria  Reflexes: Brachioradialis 2/2, biceps 2/2, triceps 2/2, patellar 2/2, Achilles 2/2, plantar responses were flexor bilaterally.  Gait and Station: Rising up from seated position without assistance, wide base stance, moderate stride, good arm swing, smooth turning, able to perform tiptoe, and heel walking without difficulty. Unsteady with tandem  DIAGNOSTIC DATA (LABS, IMAGING, TESTING) - I reviewed patient records, labs, notes, testing and imaging myself where available.  Lab Results  Component Value Date   WBC 4.7 08/29/2016   HGB 14.2 08/29/2016   HCT 42.7 08/29/2016   MCV 104.1 (H) 08/29/2016   PLT 183.0 08/29/2016      Component Value Date/Time   NA 138 08/29/2016 0920   K 4.3 08/29/2016 0920   CL 103 08/29/2016 0920   CO2 31 08/29/2016 0920   GLUCOSE 119 (H) 08/29/2016 0920   BUN 15 08/29/2016 0920   CREATININE 1.09 08/29/2016 0920   CALCIUM 8.9 08/29/2016 0920   PROT 7.1 08/29/2016 0920   ALBUMIN 3.7 08/29/2016 0920   AST 26 08/29/2016 0920   ALT 35 08/29/2016 0920   ALKPHOS 46 08/29/2016 0920   BILITOT 0.3 08/29/2016 0920   GFRNONAA >60 09/27/2014  0700   GFRAA >60 09/27/2014 0700    Lab Results  Component Value Date   HGBA1C 5.4 08/29/2016   Lab Results  Component Value Date   VITAMINB12 482 08/29/2016   Lab Results  Component Value Date   TSH 1.16 08/29/2016      ASSESSMENT AND PLAN 46 y.o. year old female  Sosa a past medical history of Downs syndrome; Depression;  Seizure; OSA on CPAP (05/2012);  and Presenile dementia, uncomplicated. here to followup. She Sosa not had seizure activity in the last  few years,  The patient is a current patient of Dr. Pearlean BrownieSethi who is out of the office today . This note is sent to the work in doctor.     Continue Namenda at current dose will refill  Continue Keppra at current dose will refill Call for any seizure activity Follow-up yearly and when necessary Nilda RiggsNancy Carolyn Martin, [facility]GNP, [facility], APRN  [facility] [address] [place], [place] [address] [phone]

## 2016-12-14 ENCOUNTER — Ambulatory Visit (INDEPENDENT_AMBULATORY_CARE_PROVIDER_SITE_OTHER): Payer: Medicare Other

## 2016-12-14 ENCOUNTER — Encounter: Payer: Self-pay | Admitting: Nurse Practitioner

## 2016-12-14 DIAGNOSIS — Z23 Encounter for immunization: Secondary | ICD-10-CM | POA: Diagnosis not present

## 2016-12-16 ENCOUNTER — Other Ambulatory Visit: Payer: Self-pay | Admitting: Nurse Practitioner

## 2016-12-18 ENCOUNTER — Other Ambulatory Visit: Payer: Self-pay | Admitting: Orthopedic Surgery

## 2016-12-18 DIAGNOSIS — M25571 Pain in right ankle and joints of right foot: Secondary | ICD-10-CM

## 2016-12-20 ENCOUNTER — Other Ambulatory Visit: Payer: Self-pay | Admitting: Family Medicine

## 2016-12-20 DIAGNOSIS — Z1231 Encounter for screening mammogram for malignant neoplasm of breast: Secondary | ICD-10-CM

## 2017-01-01 ENCOUNTER — Ambulatory Visit
Admission: RE | Admit: 2017-01-01 | Discharge: 2017-01-01 | Disposition: A | Discharge status: Home / Self Care | Payer: Medicare Other | Source: Ambulatory Visit | Attending: Orthopedic Surgery | Admitting: Orthopedic Surgery

## 2017-01-01 DIAGNOSIS — M25571 Pain in right ankle and joints of right foot: Secondary | ICD-10-CM

## 2017-01-08 ENCOUNTER — Ambulatory Visit (INDEPENDENT_AMBULATORY_CARE_PROVIDER_SITE_OTHER): Payer: Medicare Other | Admitting: Podiatry

## 2017-01-08 ENCOUNTER — Telehealth: Payer: Self-pay

## 2017-01-08 ENCOUNTER — Telehealth: Payer: Self-pay | Admitting: Podiatry

## 2017-01-08 ENCOUNTER — Other Ambulatory Visit: Payer: Self-pay | Admitting: Family Medicine

## 2017-01-08 ENCOUNTER — Encounter: Payer: Self-pay | Admitting: Family Medicine

## 2017-01-08 ENCOUNTER — Encounter: Payer: Self-pay | Admitting: Podiatry

## 2017-01-08 DIAGNOSIS — B351 Tinea unguium: Secondary | ICD-10-CM

## 2017-01-08 DIAGNOSIS — M545 Low back pain, unspecified: Secondary | ICD-10-CM | POA: Insufficient documentation

## 2017-01-08 DIAGNOSIS — M79676 Pain in unspecified toe(s): Secondary | ICD-10-CM

## 2017-01-08 DIAGNOSIS — L301 Dyshidrosis [pompholyx]: Secondary | ICD-10-CM

## 2017-01-08 NOTE — Telephone Encounter (Signed)
Appt made and patients mother notified.

## 2017-01-08 NOTE — Telephone Encounter (Signed)
Patient's mother came in to change upcoming appointment for [name] and to drop of MRI discs to Dr. Logan BoresEvans. Left envelope of discs on Dr. Michel HarrowEvan's desk.

## 2017-01-08 NOTE — Telephone Encounter (Signed)
Copied from CRM [id number]. Topic: Referral - Request >> Jan 08, 2017 10:52 AM Diana EvesHoyt, Maryann B wrote: Reason for CRM:  Pts mother requesting referral for dermatologist Dr Reche DixonJorizzo with [facility] pt's mother would like to contacted once completed.  >> Jan 08, 2017 11:03 AM Patience MuscaIsley, Telly Broberg M, LPN wrote: I spoke with pts mom, Lurena JoinerRebecca and pt is prescribed med for eczema but is not effective; pt has eczema on hands, legs and eczema is there all the time and pt constantly scratching at it. Lurena JoinerRebecca wants pt to see her dermatologist.[name] request cb when completed.

## 2017-01-08 NOTE — Telephone Encounter (Signed)
Referral placed.

## 2017-01-09 NOTE — Progress Notes (Signed)
   SUBJECTIVE Patient presents to office today complaining of elongated, thickened nails. Pain while ambulating in shoes. Patient is unable to trim their own nails.   Past Medical History:  Diagnosis Date  . Dementia due to another medical condition    Trisomy 21-related, sees Pearlean BrownieSethi yearly, nl CT head 2010, did not tolerate aricept  . Depression    with OCD  . Diabetes mellitus (HCC)    "just when she was on TPN"  . DJD (degenerative joint disease) of knee    bilat knee arthritis, had 3/3 Supartz injections [name](Kramer)  . Down syndrome   . 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 Eczema   . Empyema of left pleural space (HCC) 09/24/2011  . Hypothyroid   . OCD (obsessive compulsive disorder)   . On home oxygen therapy    "1L just at night" (09/25/2014)  . OSA on CPAP 05/2012   severe OSA with AHI 118, desat nadir to 67%  . Pneumonia    multiple pneumonias  . Presenile dementia, uncomplicated   . Seizure (HCC) 2013   x 2 ; "very mild"; during hospitalization "while coming off vent"  . Sleep-related hypoventilation 09/23/2012    OBJECTIVE General Patient is awake, alert, and oriented x 3 and in no acute distress. Derm Skin is dry and supple bilateral. Negative open lesions or macerations. Remaining integument unremarkable. Nails are tender, long, thickened and dystrophic with subungual debris, consistent with onychomycosis, 1-5 bilateral. No signs of infection noted. Vasc  DP and PT pedal pulses palpable bilaterally. Temperature gradient within normal limits.  Neuro Epicritic and protective threshold sensation diminished bilaterally.  Musculoskeletal Exam No symptomatic pedal deformities noted bilateral. Muscular strength within normal limits.  ASSESSMENT 1. Onychodystrophic nails 1-5 bilateral with hyperkeratosis of nails.  2. Onychomycosis of nail due to dermatophyte bilateral 3. Pain in foot bilateral  PLAN OF CARE 1. Patient evaluated today.  2. Instructed to maintain good pedal hygiene and foot  care.  3. Mechanical debridement of nails 1-5 bilaterally performed using a nail nipper. Filed with dremel without incident.  4. Return to clinic in 3 mos.   Mother is patient, Robbie LouisBecky Bannister.  Felecia ShellingBrent M. Evans, DPM [facility]  Dr. Felecia ShellingBrent M. Evans, DPM    [address]                                        [place], [place] [address]                Office [phone]  Fax [phone]

## 2017-01-11 ENCOUNTER — Ambulatory Visit
Admission: RE | Admit: 2017-01-11 | Discharge: 2017-01-11 | Disposition: A | Discharge status: Home / Self Care | Payer: Medicare Other | Source: Ambulatory Visit | Attending: Family Medicine | Admitting: Family Medicine

## 2017-01-11 DIAGNOSIS — Z1231 Encounter for screening mammogram for malignant neoplasm of breast: Secondary | ICD-10-CM

## 2017-01-11 LAB — HM MAMMOGRAPHY

## 2017-01-12 ENCOUNTER — Encounter: Payer: Self-pay | Admitting: Family Medicine

## 2017-01-22 ENCOUNTER — Ambulatory Visit: Payer: Medicare Other | Admitting: Podiatry

## 2017-01-24 ENCOUNTER — Encounter: Payer: Self-pay | Admitting: Podiatry

## 2017-01-24 ENCOUNTER — Ambulatory Visit (INDEPENDENT_AMBULATORY_CARE_PROVIDER_SITE_OTHER): Payer: Medicare Other | Admitting: Podiatry

## 2017-01-24 DIAGNOSIS — S86311D Strain of muscle(s) and tendon(s) of peroneal muscle group at lower leg level, right leg, subsequent encounter: Secondary | ICD-10-CM | POA: Diagnosis not present

## 2017-01-27 HISTORY — PX: ACHILLES TENDON REPAIR: SUR1153

## 2017-01-28 NOTE — Progress Notes (Signed)
   HPI: 46 year old female presents today for evaluation of a peroneus brevis tendon tear of the right foot. Patient states she had an MRI done of the right ankle secondary to pain and swelling the lateral aspect. She has been taking Meloxicam for treatment. She denies any known trauma or injury. Patient is here for further evaluation and treatment.   Past Medical History:  Diagnosis Date  . Dementia due to another medical condition    Trisomy 21-related, sees Pearlean BrownieSethi yearly, nl CT head 2010, did not tolerate aricept  . Depression    with OCD  . Diabetes mellitus (HCC)    "just when she was on TPN"  . DJD (degenerative joint disease) of knee    bilat knee arthritis, had 3/3 Supartz injections [name](Kramer)  . Down syndrome   . 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 Eczema   . Empyema of left pleural space (HCC) 09/24/2011  . Hypothyroid   . OCD (obsessive compulsive disorder)   . On home oxygen therapy    "1L just at night" (09/25/2014)  . OSA on CPAP 05/2012   severe OSA with AHI 118, desat nadir to 67%  . Pneumonia    multiple pneumonias  . Presenile dementia, uncomplicated   . Seizure (HCC) 2013   x 2 ; "very mild"; during hospitalization "while coming off vent"  . Sleep-related hypoventilation 09/23/2012     Physical Exam: General: The patient is alert and oriented x3 in no acute distress.  Dermatology: Skin is warm, dry and supple bilateral lower extremities. Negative for open lesions or macerations.  Vascular: Palpable pedal pulses bilaterally. No edema or erythema noted. Capillary refill within normal limits.  Neurological: Epicritic and protective threshold grossly intact bilaterally.   Musculoskeletal Exam: Pain with palpation to the lateral right foot and ankle. Range of motion within normal limits to all pedal and ankle joints bilateral. Muscle strength 5/5 in all groups bilateral.   MRI Impression:  1. Small focal longitudinal split tear of the peroneus brevis tendon at the level of the tip  of the lateral malleolus. Adjacent soft tissue edema. 2. Moderate arthritic changes of the talonavicular joint. 3. Minimal degenerative changes of the ankle joint.   Assessment: - tear peroneus brevis tendon right   Plan of Care:  - Patient evaluated. MRI reviewed. - Today we discussed the conservative versus surgical management of the presenting pathology. The patient opts for surgical management. All possible complications and details of the procedure were explained. All patient questions were answered. No guarantees were expressed or implied. - Authorization for surgery was initiated today. Surgery will consist of repair of the peroneus brevis tendon right. Conservative modalities have been exhausted including boot immobilization and [facility]  - Return to clinic 1 week post op.     Felecia ShellingBrent M. Eudora Guevarra, DPM [facility]  Dr. Felecia ShellingBrent M. Eugune Sine, DPM    [address]                                        [place], [place] [address]                Office [phone]  Fax [phone]

## 2017-01-31 ENCOUNTER — Other Ambulatory Visit: Payer: Self-pay | Admitting: Nurse Practitioner

## 2017-02-01 ENCOUNTER — Encounter: Payer: Self-pay | Admitting: Podiatry

## 2017-02-01 DIAGNOSIS — M66361 Spontaneous rupture of flexor tendons, right lower leg: Secondary | ICD-10-CM | POA: Diagnosis not present

## 2017-02-06 ENCOUNTER — Telehealth: Payer: Self-pay | Admitting: *Deleted

## 2017-02-06 NOTE — Telephone Encounter (Signed)
Faxed required form and demographics to [facility] with note stating their prefilled order form had not been received.

## 2017-02-06 NOTE — Telephone Encounter (Signed)
-----   Message from Peggye FothergillAngela Trotter sent at 02/02/2017 12:36 PM EST ----- Regarding: Wheelchair order Kearney HardHey Valery I am hoping that you can help me on this. Patient brought rx from Dr. Logan BoresEvans to our retail store; in order to provide and file to insurance we need updated rx to include type of wheelchair with 4 safety accessories.   I have faxed prefilled order form to office at [phone].   Can you help with this? Thanks,  Angie

## 2017-02-07 ENCOUNTER — Ambulatory Visit (INDEPENDENT_AMBULATORY_CARE_PROVIDER_SITE_OTHER): Payer: Medicare Other | Admitting: Podiatry

## 2017-02-07 DIAGNOSIS — Z9889 Other specified postprocedural states: Secondary | ICD-10-CM

## 2017-02-08 NOTE — Telephone Encounter (Signed)
Received Medical Necessity Letter from [facility]. Faxed completed [facility] required form.

## 2017-02-11 NOTE — Progress Notes (Signed)
   Subjective:  Patient presents today status post right peroneal brevis repair. DOS: 02/01/17.  She states she is doing well overall.  She reports some pain to the right ankle.  She rates his pain at 5/10.  Resting the ankle helps alleviate the pain.  Patient is here for further evaluation and treatment.   Past Medical History:  Diagnosis Date  . Dementia due to another medical condition    Trisomy 21-related, sees Pearlean BrownieSethi yearly, nl CT head 2010, did not tolerate aricept  . Depression    with OCD  . Diabetes mellitus (HCC)    "just when she was on TPN"  . DJD (degenerative joint disease) of knee    bilat knee arthritis, had 3/3 Supartz injections [name](Kramer)  . Down syndrome   . 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 Eczema   . Empyema of left pleural space (HCC) 09/24/2011  . Hypothyroid   . OCD (obsessive compulsive disorder)   . On home oxygen therapy    "1L just at night" (09/25/2014)  . OSA on CPAP 05/2012   severe OSA with AHI 118, desat nadir to 67%  . Pneumonia    multiple pneumonias  . Presenile dementia, uncomplicated   . Seizure (HCC) 2013   x 2 ; "very mild"; during hospitalization "while coming off vent"  . Sleep-related hypoventilation 09/23/2012      Objective/Physical Exam Skin incisions appear to be well coapted with sutures and staples intact. No sign of infectious process noted. No dehiscence. No active bleeding noted. Moderate edema noted to the surgical extremity.  Assessment: 1. s/p right peroneal brevis repair. DOS: 02/01/17.   Plan of Care:  1. Patient was evaluated.  2.  Dressing changed.   3.  Continue nonweightbearing in cam boot. 4.  Return to clinic in 1 week.   Felecia ShellingBrent M. Latorie Montesano, DPM [facility]  Dr. Felecia ShellingBrent M. Hoyle Barkdull, DPM    [address]                                        [place], [place] [address]                Office [phone]  Fax [phone]

## 2017-02-14 ENCOUNTER — Ambulatory Visit (INDEPENDENT_AMBULATORY_CARE_PROVIDER_SITE_OTHER): Payer: Medicare Other | Admitting: Podiatry

## 2017-02-14 DIAGNOSIS — S86311D Strain of muscle(s) and tendon(s) of peroneal muscle group at lower leg level, right leg, subsequent encounter: Secondary | ICD-10-CM

## 2017-02-14 DIAGNOSIS — Z9889 Other specified postprocedural states: Secondary | ICD-10-CM

## 2017-02-20 NOTE — Progress Notes (Signed)
[facility]  PATIENT: [name] DOB: [birth year]   REASON FOR VISIT: Follow-up for seizure disorder, [name] and memory loss HISTORY FROM: Patient and mom    HISTORY OF PRESENT ILLNESS:UPDATE 12/26/2018CM [name], 46 year old female returns for follow-up with a history of Down syndrome and cognitive decline over several years.  She also has a seizure disorder which is currently well controlled on Keppra 500 mg twice daily.  She remains on Namenda 10 mg twice daily.  She gets little exercise and is overweight.  She continues to go to adult care 5 days a week.  She has a history of obstructive sleep apnea and is on CPAP with additional oxygen at night.  No recent behavior issues.  She has a history of gout.  She had recent ankle surgery for a torn tendon and is wearing a boot on the right foot.  No recent behavior issues.  Returns for reevaluation UPDATE 12/15/2015 CM [name], 46 year old female returns for followup with her mother. She has a history of Downs syndrome and  cognitive decline over several years as well as seizure disorder. She is currently well controlled on Keppra and she is on Namenda without side effects to either drug. She is overweight, gets little exercise, lives at home. She goes to adult daycare 5 days a week . She has been having a lot of abdominal pain and diarrhea, she has appointment with GI in December.  She occasionally has behavior problems and was recently placed on Zoloft however it made her irritability worse and she is slowly titrating off this. She needs refills on Keppra and Namenda . She returns for reevaluation Update 10/14/16PS : She returns for follow-up after last visit a year ago. She is a complaint by sister who provides most of the history. She continues to do well and has not had any breakthrough seizures now for a few years. Her memory and cognitive difficulties remain unchanged. She requires close supervision at home and  does spend the day at the daycare center. She can feed dress herself and bathe herself. She is never left alone. There have been no issues with agitation, behavior, delusions or hallucinations or safety concerns. Her gait imbalance remained stable and she has had no falls.  HISTORY: [name] is a 85 year year old Caucasian lady with lifelong history of Down`s syndrome and cognitive decline in last several years from dementia who is referred back to see need for a new problem of seizures. She had recent prolonged hospital stay [facility] pneumonia with empyema and ventilator dependent respiratory failure. She underwent video assisted thoracotomy for empyma drainage. She required prolonged antibiotics and was eventually required tracheostomy. She had 2 brief episodes of what sound like complex partial seizures one week apart in September 2013. Her mother who is present today was eyewitness to both episodes. She was described as staring and unresponsive not following commands with a distant look on her face lasting a few minutes followed by a period of disorientation and tiredness. In the second episode she had some clonic activity in both her upper extremities. She was started on Keppra 500 twice a day falling and abnormal EEG on 11/04/2011 and with showed sharp waves in the left occipital parietal region. She was initially off her Namenda and Prozac in the hospital stay and had some behavioral agitation but settled down after the medicines were restarted. She has done well since discharge and has not  had any more seizures. She seems to be tolerating Keppra quite well without any side effects. She is finished a course of antibiotics and home physical and occupation therapy. She is back living at home with mom and plans to start attending adult daycare soon. She apparently did not have any brain imaging study done.  03/11/12: [name] returns for followup. She was last  seen by Dr. [name] 12/11/11: She had prolonged hospitilization in Sept 2013 and had seizure events. She was started on Keppra at that time. No further seizure events. MRI of the brain 12/18/11 was normal. EEG was abnormal suggestive of mild focal irritability in right frontal and temporal regions but no definite epileptiform  activity is noted. ROS neg.    REVIEW OF SYSTEMS: Full 14 system review of systems performed and notable only for those listed, all others are neg:  Constitutional: neg  Cardiovascular: neg Ear/Nose/Throat: neg  Skin: neg Eyes: neg Respiratory: neg Gastroitestinal: neg Hematology/Lymphatic: neg  Endocrine: neg Musculoskeletal:neg Allergy/Immunology: neg Neurological: History of seizure disorder, Down syndrome, memory decline Psychiatric: Agitation at times Sleep : neg   ALLERGIES: No Known Allergies  HOME MEDICATIONS: Outpatient Medications Prior to Visit  Medication Sig Dispense Refill  . albuterol (PROVENTIL) (2.5 MG/3ML) 0.083% nebulizer solution Take 3 mLs (2.5 mg total) by nebulization every 6 (six) hours as needed for wheezing or shortness of breath. 75 mL 5  . colchicine 0.6 MG tablet Take 1 tablet (0.6 mg total) by mouth daily as needed (gout flare). 30 tablet 1  . levETIRAcetam (KEPPRA) 500 MG tablet TAKE 1 TABLET EVERY 12 HOURS 180 tablet 0  . levothyroxine (SYNTHROID, LEVOTHROID) 125 MCG tablet TAKE 1 TABLET BY MOUTH DAILY BEFORE BREAKFAST. 90 tablet 4  . meloxicam (MOBIC) 15 MG tablet Take 15 mg by mouth daily.    . memantine (NAMENDA) 10 MG tablet TAKE 1 TABLET BY MOUTH TWICE A DAY 60 tablet 2  . Multiple Vitamins-Minerals (WOMENS MULTI PO) Take 1 tablet by mouth daily.     . mupirocin ointment (BACTROBAN) 2 % APPLY TWICE DAILY. 22 g 0  . OXYGEN Inhale 1 L into the lungs at bedtime. Via CPAP    . Probiotic Product (PROBIOTIC PO) Take 1 tablet by mouth daily.     No facility-administered medications prior to visit.     PAST MEDICAL  HISTORY: Past Medical History:  Diagnosis Date  . Dementia due to another medical condition    Trisomy 21-related, sees [name] yearly, nl CT head 2010, did not tolerate aricept  . Depression    with OCD  . Diabetes mellitus (HCC)    "just when she was on TPN"  . DJD (degenerative joint disease) of knee    bilat knee arthritis, had 3/3 Supartz injections [name])  . Down syndrome   . 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 Eczema   . Empyema of left pleural space (HCC) 09/24/2011  . Hypothyroid   . OCD (obsessive compulsive disorder)   . On home oxygen therapy    "1L just at night" (09/25/2014)  . OSA on CPAP 05/2012   severe OSA with AHI 118, desat nadir to 67%  . Pneumonia    multiple pneumonias  . Presenile dementia, uncomplicated   . Seizure (HCC) 2013   x 2 ; "very mild"; during hospitalization "while coming off vent"  . Sleep-related hypoventilation 09/23/2012    PAST SURGICAL HISTORY: Past Surgical History:  Procedure Laterality Date  . ANTERIOR CRUCIATE LIGAMENT REPAIR Left   .  BACK SURGERY    . FLEXIBLE BRONCHOSCOPY  09/24/2011   Procedure: FLEXIBLE BRONCHOSCOPY;  Surgeon: Purcell Nailslarence H Owen, MD;  [facility];  Service: Thoracic;  Laterality: N/A;  . LUMBAR DISC SURGERY     ruptured disc  . TRACHEOSTOMY TUBE PLACEMENT  10/11/2011   Procedure: TRACHEOSTOMY;  Surgeon: Serena ColonelJefry Rosen, MD;  [facility];  Service: ENT;  Laterality: N/A;  . VIDEO ASSISTED THORACOSCOPY (VATS) /THOROCOTOMY/RADIOACTIVE SEED IMPLANT Left 09/24/2011   VATS, (Dr. Barry Dieneswens)    FAMILY HISTORY: Family History  Problem Relation Age of Onset  . Arthritis Mother   . Hypertension Father   . Diabetes Father   . Cancer Neg Hx   . Coronary artery disease Neg Hx   . Stroke Neg Hx     SOCIAL HISTORY: Social History   Socioeconomic History  . Marital status: Single    Spouse name: Not on file  . Number of children: 0  . Years of education: Not on file  . Highest education level: Not on file  Social Needs  .  Financial resource strain: Not on file  . Food insecurity - worry: Not on file  . Food insecurity - inability: Not on file  . Transportation needs - medical: Not on file  . Transportation needs - non-medical: Not on file  Occupational History  . Not on file  Tobacco Use  . Smoking status: Never Smoker  . Smokeless tobacco: Never Used  Substance and Sexual Activity  . Alcohol use: No  . Drug use: No  . Sexual activity: No  Other Topics Concern  . Not on file  Social History Narrative   Lives with mom and dad, dogs   Stays at daycare during the day   Activity: walking some   Diet: good water, fruits/vegetables daily     PHYSICAL EXAM  Vitals:   02/21/17 1240  BP: (!) 106/59  Pulse: (!) 59  Weight: 226 lb (102.5 kg)  Height: 4' 9.25" (1.454 m)   Body mass index is 48.48 kg/m. Generalized: Well developed, obese female in no acute distress with downs features  Head: normocephalic and atraumatic,.    Neck: Supple, no carotid bruits  Cardiac: Regular rate rhythm, no murmur  Neurological examination  Mentation: Alert . Follows all commands speech and language fluent but with childlike quality. Diminished attention, registration and recall. Cranial nerve II-XII: Pupils were equal round reactive to light extraocular movements with end gaze horizontal nystagmus , visual field were full on confrontational test. Facial sensation and strength were normal. hearing was intact to finger rubbing bilaterally. Uvula tongue midline. head turning and shoulder shrug and were normal and symmetric.Tongue protrusion into cheek strength was normal.  Motor: normal bulk and tone, full strength in the BUE, BLE, fine finger movements normal, no pronator drift. No focal weakness  Sensory: normal and symmetric to light touch, pinprick, and vibration in the upper and lower extremities Coordination: finger-nose-finger, heel-to-shin bilaterally, no dysmetria  Reflexes: 1+ upper lower and symmetric, plantar  responses were flexor bilaterally.  Gait and Station: Rising up from seated position without assistance, wide base stance, moderate stride, good arm swing, smooth turning, able to perform tiptoe, and heel walking without difficulty. Unsteady with tandem  DIAGNOSTIC DATA (LABS, IMAGING, TESTING) - I reviewed patient records, labs, notes, testing and imaging myself where available.  Lab Results  Component Value Date   WBC 4.7 08/29/2016   HGB 14.2 08/29/2016   HCT 42.7 08/29/2016   MCV 104.1 (H) 08/29/2016  PLT 183.0 08/29/2016      Component Value Date/Time   NA 138 08/29/2016 0920   K 4.3 08/29/2016 0920   CL 103 08/29/2016 0920   CO2 31 08/29/2016 0920   GLUCOSE 119 (H) 08/29/2016 0920   BUN 15 08/29/2016 0920   CREATININE 1.09 08/29/2016 0920   CALCIUM 8.9 08/29/2016 0920   PROT 7.1 08/29/2016 0920   ALBUMIN 3.7 08/29/2016 0920   AST 26 08/29/2016 0920   ALT 35 08/29/2016 0920   ALKPHOS 46 08/29/2016 0920   BILITOT 0.3 08/29/2016 0920   GFRNONAA >60 09/27/2014 0700   GFRAA >60 09/27/2014 0700    Lab Results  Component Value Date   HGBA1C 5.4 08/29/2016   Lab Results  Component Value Date   VITAMINB12 482 08/29/2016   Lab Results  Component Value Date   TSH 1.16 08/29/2016      ASSESSMENT AND PLAN 46 y.o. year old female  has a past medical history of Downs syndrome; Depression;  Seizure; OSA on CPAP (05/2012);  and Presenile dementia, uncomplicated. here to followup. She has not had seizure activity in the last  few years,  The patient is a current patient of Dr. Pearlean BrownieSethi who is out of the office today . This note is sent to the work in doctor.     Continue Namenda at current dose will refill  Continue Keppra at current dose will refill Reviewed recent labs from 08/29/16.  Continue with CPAP for obstructive sleep apnea Call for any seizure activity Follow-up yearly and when necessary Nilda RiggsNancy Carolyn Martin, [facility] Hospital[facility]NP, [facility], APRN  [facility] [address] [place], [place] [address] [phone]

## 2017-02-21 ENCOUNTER — Encounter: Payer: Self-pay | Admitting: Nurse Practitioner

## 2017-02-21 ENCOUNTER — Ambulatory Visit (INDEPENDENT_AMBULATORY_CARE_PROVIDER_SITE_OTHER): Payer: Medicare Other | Admitting: Nurse Practitioner

## 2017-02-21 VITALS — BP 106/59 | HR 59 | Ht <= 58 in | Wt 226.0 lb

## 2017-02-21 DIAGNOSIS — Q909 Down syndrome, unspecified: Secondary | ICD-10-CM

## 2017-02-21 DIAGNOSIS — G40209 Localization-related (focal) (partial) symptomatic epilepsy and epileptic syndromes with complex partial seizures, not intractable, without status epilepticus: Secondary | ICD-10-CM | POA: Diagnosis not present

## 2017-02-21 DIAGNOSIS — R413 Other amnesia: Secondary | ICD-10-CM | POA: Insufficient documentation

## 2017-02-21 MED ORDER — LEVETIRACETAM 500 MG PO TABS: ORAL_TABLET | ORAL | 3 refills | Status: DC

## 2017-02-21 MED ORDER — MEMANTINE HCL 10 MG PO TABS: 10.0000 mg | ORAL_TABLET | Freq: Two times a day (BID) | ORAL | 3 refills | Status: DC

## 2017-02-21 NOTE — Patient Instructions (Signed)
Continue Namenda at current dose will refill  Continue Keppra at current dose will refill Call for any seizure activity Follow-up yearly and when necessary

## 2017-02-21 NOTE — Progress Notes (Signed)
   Subjective:  Patient presents today status post right peroneal brevis repair. DOS: 02/01/17.  She states she is doing well overall.  She denies any new complaints at this time.  Patient is here for further evaluation and treatment.   Past Medical History:  Diagnosis Date  . Dementia due to another medical condition    Trisomy 21-related, sees Pearlean BrownieSethi yearly, nl CT head 2010, did not tolerate aricept  . Depression    with OCD  . Diabetes mellitus (HCC)    "just when she was on TPN"  . DJD (degenerative joint disease) of knee    bilat knee arthritis, had 3/3 Supartz injections [name](Kramer)  . Down syndrome   . 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 Eczema   . Empyema of left pleural space (HCC) 09/24/2011  . Hypothyroid   . OCD (obsessive compulsive disorder)   . On home oxygen therapy    "1L just at night" (09/25/2014)  . OSA on CPAP 05/2012   severe OSA with AHI 118, desat nadir to 67%  . Pneumonia    multiple pneumonias  . Presenile dementia, uncomplicated   . Seizure (HCC) 2013   x 2 ; "very mild"; during hospitalization "while coming off vent"  . Sleep-related hypoventilation 09/23/2012      Objective/Physical Exam Skin incisions appear to be well coapted with sutures and staples intact. No sign of infectious process noted. No dehiscence. No active bleeding noted. Moderate edema noted to the surgical extremity.  Assessment: 1. s/p right peroneal brevis repair. DOS: 02/01/17.   Plan of Care:  1.  Patient was evaluated.  2.  Staples removed. 3.  Begin light partial weightbearing in cam boot. 4.  Return to clinic in 2 weeks.   Felecia ShellingBrent M. Graham Doukas, DPM [facility]  Dr. Felecia ShellingBrent M. Masiah Lewing, DPM    [address]                                        [place], [place] [address]                Office [phone]  Fax [phone]

## 2017-02-23 NOTE — Progress Notes (Signed)
I reviewed note and agree with plan.   Salene Mohamud R. Ranier Coach, MD 02/23/2017, 2:10 PM Certified in Neurology, Neurophysiology and Neuroimaging  [facility] [address], [address] [place], [place] [address] [phone]  

## 2017-02-28 ENCOUNTER — Encounter: Payer: Self-pay | Admitting: Podiatry

## 2017-02-28 ENCOUNTER — Ambulatory Visit (INDEPENDENT_AMBULATORY_CARE_PROVIDER_SITE_OTHER): Payer: Medicare Other | Admitting: Podiatry

## 2017-02-28 DIAGNOSIS — Z9889 Other specified postprocedural states: Secondary | ICD-10-CM

## 2017-03-04 NOTE — Progress Notes (Signed)
   Subjective:  Patient presents today status post right peroneal brevis repair. DOS: 02/01/17.  She states she is doing well overall but reports some continued soreness in the right foot.  She denies any new complaints at this time.  Patient is here for further evaluation and treatment.   Past Medical History:  Diagnosis Date  . Dementia due to another medical condition    Trisomy 21-related, sees Pearlean BrownieSethi yearly, nl CT head 2010, did not tolerate aricept  . Depression    with OCD  . Diabetes mellitus (HCC)    "just when she was on TPN"  . DJD (degenerative joint disease) of knee    bilat knee arthritis, had 3/3 Supartz injections [name](Kramer)  . Down syndrome   . 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 Eczema   . Empyema of left pleural space (HCC) 09/24/2011  . Hypothyroid   . OCD (obsessive compulsive disorder)   . On home oxygen therapy    "1L just at night" (09/25/2014)  . OSA on CPAP 05/2012   severe OSA with AHI 118, desat nadir to 67%  . Pneumonia    multiple pneumonias  . Presenile dementia, uncomplicated   . Seizure (HCC) 2013   x 2 ; "very mild"; during hospitalization "while coming off vent"  . Sleep-related hypoventilation 09/23/2012      Objective/Physical Exam Skin incisions appear to be well coapted. No sign of infectious process noted. No dehiscence. No active bleeding noted. Moderate edema noted to the surgical extremity.  Assessment: 1. s/p right peroneal brevis repair. DOS: 02/01/17.   Plan of Care:  1.  Patient was evaluated.  2.  Begin to transition out of cam boot into good sneakers. 3.  Orders for physical therapy 3 times weekly for 4 weeks. 4.  Return to clinic in 4 weeks.   Felecia ShellingBrent M. Velia Pamer, DPM [facility]  Dr. Felecia ShellingBrent M. Jessejames Steelman, DPM    [address]                                        [place], [place] [address]                Office [phone]  Fax [phone]

## 2017-03-07 ENCOUNTER — Encounter: Payer: Self-pay | Admitting: Podiatry

## 2017-03-21 ENCOUNTER — Encounter: Payer: Self-pay | Admitting: Podiatry

## 2017-03-21 ENCOUNTER — Ambulatory Visit (INDEPENDENT_AMBULATORY_CARE_PROVIDER_SITE_OTHER): Payer: Medicare Other

## 2017-03-21 ENCOUNTER — Other Ambulatory Visit: Payer: Self-pay | Admitting: Podiatry

## 2017-03-21 ENCOUNTER — Ambulatory Visit (INDEPENDENT_AMBULATORY_CARE_PROVIDER_SITE_OTHER): Payer: Medicare Other | Admitting: Podiatry

## 2017-03-21 DIAGNOSIS — M7671 Peroneal tendinitis, right leg: Secondary | ICD-10-CM | POA: Diagnosis not present

## 2017-03-21 DIAGNOSIS — R52 Pain, unspecified: Secondary | ICD-10-CM

## 2017-03-21 DIAGNOSIS — Z9889 Other specified postprocedural states: Secondary | ICD-10-CM

## 2017-03-28 ENCOUNTER — Encounter: Payer: Medicare Other | Admitting: Podiatry

## 2017-03-28 NOTE — Progress Notes (Signed)
   Subjective:  Patient presents today status post right peroneal brevis repair. DOS: 02/01/17.  Patient states that after her first session of physical therapy the lateral side of her foot became extremely swollen and painful times 3 days.  Patient believes the possibly physical therapy was too aggressive.  She currently is able to ambulate in the immobilization cam boot.  Prior to physical therapy the patient was ambulating well in good supportive sneakers however she returned to the cam boot once she started to experience pain and tenderness.  She presents today for further treatment and evaluation   Past Medical History:  Diagnosis Date  . Dementia due to another medical condition    Trisomy 21-related, sees Pearlean BrownieSethi yearly, nl CT head 2010, did not tolerate aricept  . Depression    with OCD  . Diabetes mellitus (HCC)    "just when she was on TPN"  . DJD (degenerative joint disease) of knee    bilat knee arthritis, had 3/3 Supartz injections [name](Kramer)  . Down syndrome   . 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 Eczema   . Empyema of left pleural space (HCC) 09/24/2011  . Hypothyroid   . OCD (obsessive compulsive disorder)   . On home oxygen therapy    "1L just at night" (09/25/2014)  . OSA on CPAP 05/2012   severe OSA with AHI 118, desat nadir to 67%  . Pneumonia    multiple pneumonias  . Presenile dementia, uncomplicated   . Seizure (HCC) 2013   x 2 ; "very mild"; during hospitalization "while coming off vent"  . Sleep-related hypoventilation 09/23/2012      Objective/Physical Exam Skin incisions appear to be well coapted. No sign of infectious process noted. No dehiscence. No active bleeding noted. Moderate edema noted to the surgical extremity with some pain on palpation noted to the lateral aspect of the patient's right ankle joint.  Radiographic exam No fracture identified.  Joint space is preserved.  Normal osseous mineralization.  Assessment: 1. s/p right peroneal brevis repair. DOS: 02/01/17.     2.  Increased edema with pain right surgical extremity   Plan of Care:  1.  Patient was evaluated.  X-rays reviewed today 2.  Today we are going to put a hold on physical therapy.  The patient was improving well without physical therapy prior to the first session. 3.  Continue weightbearing as tolerated in the immobilization cam boot for the next 2 weeks. 4.  Return to clinic in 2 weeks  Felecia ShellingBrent M. Evans, DPM [facility]  Dr. Felecia ShellingBrent M. Evans, DPM    [address]                                        [place], [place] [address]                Office [phone](336) [phone]  Fax [phone]

## 2017-04-02 NOTE — Progress Notes (Signed)
DOS 02/01/17 Repair peroneal tendon Rt

## 2017-04-02 NOTE — Progress Notes (Signed)
This encounter was created in error - please disregard.

## 2017-04-04 ENCOUNTER — Ambulatory Visit: Payer: Medicare Other | Admitting: Podiatry

## 2017-04-09 ENCOUNTER — Encounter: Payer: Self-pay | Admitting: Podiatry

## 2017-04-09 ENCOUNTER — Ambulatory Visit (INDEPENDENT_AMBULATORY_CARE_PROVIDER_SITE_OTHER): Payer: Medicare Other | Admitting: Podiatry

## 2017-04-09 DIAGNOSIS — B351 Tinea unguium: Secondary | ICD-10-CM | POA: Diagnosis not present

## 2017-04-09 DIAGNOSIS — M79676 Pain in unspecified toe(s): Secondary | ICD-10-CM

## 2017-04-09 DIAGNOSIS — Z9889 Other specified postprocedural states: Secondary | ICD-10-CM

## 2017-04-10 NOTE — Progress Notes (Signed)
   SUBJECTIVE Patient presents today status post right peroneal brevis repair. DOS: 02/01/17. She reports that the swelling of the right foot has improved. She reports some continued mild pain but states it has improved since her previous appointment. She also complains of elongated, thickened nails that are painful while ambulating in shoes. She is unable to trim her own nails. Patient is here for further evaluation and treatment.   Past Medical History:  Diagnosis Date  . Dementia due to another medical condition    Trisomy 21-related, sees Pearlean BrownieSethi yearly, nl CT head 2010, did not tolerate aricept  . Depression    with OCD  . Diabetes mellitus (HCC)    "just when she was on TPN"  . DJD (degenerative joint disease) of knee    bilat knee arthritis, had 3/3 Supartz injections [name](Kramer)  . Down syndrome   . 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 Eczema   . Empyema of left pleural space (HCC) 09/24/2011  . Hypothyroid   . OCD (obsessive compulsive disorder)   . On home oxygen therapy    "1L just at night" (09/25/2014)  . OSA on CPAP 05/2012   severe OSA with AHI 118, desat nadir to 67%  . Pneumonia    multiple pneumonias  . Presenile dementia, uncomplicated   . Seizure (HCC) 2013   x 2 ; "very mild"; during hospitalization "while coming off vent"  . Sleep-related hypoventilation 09/23/2012    OBJECTIVE General Patient is awake, alert, and oriented x 3 and in no acute distress. Derm Skin is dry and supple bilateral. Negative open lesions or macerations. Remaining integument unremarkable. Nails are tender, long, thickened and dystrophic with subungual debris, consistent with onychomycosis, 1-5 bilateral. No signs of infection noted. Vasc  DP and PT pedal pulses palpable bilaterally. Temperature gradient within normal limits.  Neuro Epicritic and protective threshold sensation diminished bilaterally.  Musculoskeletal Exam No symptomatic pedal deformities noted bilateral. Muscular strength within normal  limits.  ASSESSMENT 1. s/p right peroneal brevis repair. DOS: 02/01/17. - resolved 2. Increased edema with pain right surgical extremity - resolved 3. Onychodystrophic nails 1-5 bilateral with hyperkeratosis of nails.  4. Onychomycosis of nail due to dermatophyte bilateral  PLAN OF CARE 1. Patient evaluated today.  2. Instructed to maintain good pedal hygiene and foot care.  3. Mechanical debridement of nails 1-5 bilaterally performed using a nail nipper. Filed with dremel without incident.  4. May return to full activity with no restrictions. 5. Recommended good shoe gear.  6. Return to clinic in 3 mos.    Felecia ShellingBrent M. Denson Niccoli, DPM [facility]  Dr. Felecia ShellingBrent M. Kyrus Hyde, DPM    [address]                                        [place], [place] [address]                Office [phone]([phone]  Fax [phone]

## 2017-04-18 ENCOUNTER — Telehealth: Payer: Self-pay | Admitting: Family Medicine

## 2017-04-18 MED ORDER — LEVOTHYROXINE SODIUM 125 MCG PO TABS: ORAL_TABLET | ORAL | 4 refills | Status: DC

## 2017-04-18 NOTE — Telephone Encounter (Signed)
Refilled levothyroxine, will need new prescription sent for allopurinol. Last OV 09/08/16, last filled 09/08/16 30 tab/11 refills sent to another pharmacy.

## 2017-04-18 NOTE — Telephone Encounter (Signed)
Copied from CRM [id number]. Topic: Quick Communication - Rx Refill/Question >> Apr 18, 2017  1:39 PM Landry MellowFoltz, Melissa J wrote: Medication: allopurinil and levothyroxine    Has the patient contacted their pharmacy? Yes.     (Agent: If no, request that the patient contact the pharmacy for the refill.)   Preferred Pharmacy (with phone number or street name): [facility] [address]    Agent: Please be advised that RX refills may take up to 3 business days. We ask that you follow-up with your pharmacy.

## 2017-04-19 MED ORDER — ALLOPURINOL 100 MG PO TABS: 100.0000 mg | ORAL_TABLET | Freq: Every day | ORAL | 3 refills | Status: DC

## 2017-04-19 NOTE — Telephone Encounter (Signed)
refilled 

## 2017-04-29 ENCOUNTER — Other Ambulatory Visit: Payer: Self-pay | Admitting: Family Medicine

## 2017-05-01 ENCOUNTER — Telehealth: Payer: Self-pay

## 2017-05-01 NOTE — Telephone Encounter (Signed)
Left message on vm per dpr asking pt's mother, Lurena JoinerRebecca, if she was able to get allopurinol rx at [facility] [address]  Asked that she call back to let us know.

## 2017-05-07 ENCOUNTER — Ambulatory Visit: Payer: Medicare Other | Admitting: Pulmonary Disease

## 2017-05-07 ENCOUNTER — Encounter: Payer: Self-pay | Admitting: Pulmonary Disease

## 2017-05-07 ENCOUNTER — Ambulatory Visit (INDEPENDENT_AMBULATORY_CARE_PROVIDER_SITE_OTHER): Payer: Medicare Other | Admitting: Pulmonary Disease

## 2017-05-07 VITALS — BP 112/68 | HR 61 | Ht 59.0 in | Wt 230.0 lb

## 2017-05-07 DIAGNOSIS — J9611 Chronic respiratory failure with hypoxia: Secondary | ICD-10-CM

## 2017-05-07 DIAGNOSIS — Z9989 Dependence on other enabling machines and devices: Secondary | ICD-10-CM

## 2017-05-07 DIAGNOSIS — G4733 Obstructive sleep apnea (adult) (pediatric): Secondary | ICD-10-CM | POA: Diagnosis not present

## 2017-05-07 DIAGNOSIS — E662 Morbid (severe) obesity with alveolar hypoventilation: Secondary | ICD-10-CM | POA: Diagnosis not present

## 2017-05-07 NOTE — Patient Instructions (Signed)
Will arrange for new CPAP set up  Follow up in 3 months 

## 2017-05-07 NOTE — Progress Notes (Signed)
[name]  Chief Complaint  Patient presents with  . Follow-up    Pt is doing well with the cpap machine, forgot her SD card today.  Pt requests new cpap machine, been over 5 years.    Vital signs: BP 112/68 (BP Location: Left Arm, Cuff Size: Normal)   Pulse 61   Ht 4\' 11"  (1.499 m)   Wt 230 lb (104.3 kg)   SpO2 99%   BMI 46.45 kg/m   History of Present Illness: [name] Luck is a 47 y.o. female with severe OSA.  She has prior hx of pneumonia, empyema, and respiratory failure requiring tracheostomy.  She uses CPAP and oxygen nightly.  No issue with mask fit.  Goes to bed at 10 pm.  Wakes up at 8 am.  Sleeps through the night.  Not feeling sleepy during the day.  Her machine is about 47 yrs old.  Physical Exam:  General - pleasant Eyes - pupils reactive, wears glasses ENT - no sinus tenderness, no oral exudate, no LAN, MP 3, healed tracheostomy scar Cardiac - regular, no murmur Chest - no wheeze, rales Abd - soft, non tender Ext - no edema Skin - changes of eczema Neuro - normal strength Psych - normal mood  Assessment/Plan:  Obstructive sleep apnea. - she is compliant with CPAP and reports benefit from therapy - will arrange for new auto CPAP machine since her device is more than 47 yrs old  Chronic hypoxic respiratory failure 2nd to obesity hypoventilation syndrome. - continue 1 liters oxygen at night with CPAP   Patient Instructions  Will arrange for new CPAP set up  Follow up in 3 months    Coralyn HellingVineet Chaka Boyson, MD [facility] 05/07/2017, 2:33 PM Pager:  [phone]  Flow Sheet  Sleep tests: PSG 06/02/12 >> AHI 118.3, SpO2 low 67% ONO with CPAP and 1 liter 07/18/12 >> Test time 7 hrs 44 min.  Basal SpO2 96%, SpO2 low 81%.  Spent 0.7% of time with SpO2 < 88%. Auto CPAP 06/15/15 to 07/14/15 >> used on 30 of 30 nights with average 8 hrs and 19 min.  Average AHI is 2.1 with median CPAP 10 cm H2O and 95 th  percentile CPAP 12 cm H20.  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  Past Surgical History: She  has a past surgical history that includes Back surgery; Anterior cruciate ligament repair (Left); Flexible bronchoscopy (09/24/2011); Tracheostomy tube placement (10/11/2011); Video assisted thoracoscopy (vats) /thorocotomy/radioactive seed implant (Left, 09/24/2011); and Lumbar disc surgery.  Family History: Her family history includes Arthritis in her mother; Diabetes in her father; Hypertension in her father.  Social History: She  reports that  has never smoked. she has never used smokeless tobacco. She reports that she does not drink alcohol or use drugs.  Medications: Allergies as of 05/07/2017   No Known Allergies     Medication List        Accurate as of 05/07/17  2:33 PM. Always use your most recent med list.          albuterol (2.5  MG/3ML) 0.083% nebulizer solution Commonly known as:  PROVENTIL Take 3 mLs (2.5 mg total) by nebulization every 6 (six) hours as needed for wheezing or shortness of breath.   allopurinol 100 MG tablet Commonly known as:  ZYLOPRIM Take 1 tablet (100 mg total) by mouth daily.   colchicine 0.6 MG tablet Take 1 tablet (0.6 mg total) by mouth daily as needed (gout flare).   levETIRAcetam 500 MG tablet Commonly known as:  KEPPRA TAKE 1 TABLET EVERY 12 HOURS   levothyroxine 125 MCG tablet Commonly known as:  SYNTHROID, LEVOTHROID TAKE 1 TABLET BY MOUTH DAILY BEFORE  BREAKFAST.   meloxicam 15 MG tablet Commonly known as:  MOBIC Take 15 mg by mouth daily.   memantine 10 MG tablet Commonly known as:  NAMENDA Take 1 tablet (10 mg total) by mouth 2 (two) times daily.   mupirocin ointment 2 % Commonly known as:  BACTROBAN APPLY TWICE DAILY.   OXYGEN Inhale 1 L into the lungs at bedtime. Via CPAP   PROBIOTIC PO Take 1 tablet by mouth daily.   WOMENS MULTI PO Take 1 tablet by mouth daily.

## 2017-06-12 ENCOUNTER — Telehealth: Payer: Self-pay | Admitting: Pulmonary Disease

## 2017-06-12 NOTE — Telephone Encounter (Signed)
[facility] [place].  He saw where we sent in order on 3/11.  Thereasa DistanceRodney called pt on 3/13 & left her vm letting her know she had collection balance on 99.71 & this would have to be taken care of first.  He said for her to call & ask for [employer].  I called pt & told her Thereasa DistanceRodney had called her & left her vm.  I gave her the info to call.  She stated ok & she would do this.  Nothing further needed.

## 2017-06-12 NOTE — Telephone Encounter (Signed)
[facility].

## 2017-08-01 ENCOUNTER — Other Ambulatory Visit (INDEPENDENT_AMBULATORY_CARE_PROVIDER_SITE_OTHER): Payer: Medicare Other

## 2017-08-01 ENCOUNTER — Telehealth (INDEPENDENT_AMBULATORY_CARE_PROVIDER_SITE_OTHER): Payer: Medicare Other

## 2017-08-01 ENCOUNTER — Ambulatory Visit (INDEPENDENT_AMBULATORY_CARE_PROVIDER_SITE_OTHER): Payer: Medicare Other

## 2017-08-01 DIAGNOSIS — R7303 Prediabetes: Secondary | ICD-10-CM

## 2017-08-01 DIAGNOSIS — M1A00X0 Idiopathic chronic gout, unspecified site, without tophus (tophi): Secondary | ICD-10-CM

## 2017-08-01 DIAGNOSIS — M10062 Idiopathic gout, left knee: Secondary | ICD-10-CM | POA: Diagnosis not present

## 2017-08-01 DIAGNOSIS — E039 Hypothyroidism, unspecified: Secondary | ICD-10-CM

## 2017-08-01 DIAGNOSIS — Z0000 Encounter for general adult medical examination without abnormal findings: Secondary | ICD-10-CM

## 2017-08-01 LAB — CBC WITH DIFFERENTIAL/PLATELET
Basophils Absolute: 0.1 10*3/uL (ref 0.0–0.1)
Basophils Relative: 2.3 % (ref 0.0–3.0)
EOS PCT: 1.8 % (ref 0.0–5.0)
Eosinophils Absolute: 0.1 10*3/uL (ref 0.0–0.7)
HCT: 41.8 % (ref 36.0–46.0)
Hemoglobin: 14 g/dL (ref 12.0–15.0)
LYMPHS PCT: 17.4 % (ref 12.0–46.0)
Lymphs Abs: 1 10*3/uL (ref 0.7–4.0)
MCHC: 33.4 g/dL (ref 30.0–36.0)
MCV: 103.7 fl — AB (ref 78.0–100.0)
MONO ABS: 0.5 10*3/uL (ref 0.1–1.0)
MONOS PCT: 8.4 % (ref 3.0–12.0)
Neutro Abs: 4 10*3/uL (ref 1.4–7.7)
Neutrophils Relative %: 70.1 % (ref 43.0–77.0)
Platelets: 224 10*3/uL (ref 150.0–400.0)
RBC: 4.03 Mil/uL (ref 3.87–5.11)
RDW: 14.7 % (ref 11.5–15.5)
WBC: 5.8 10*3/uL (ref 4.0–10.5)

## 2017-08-01 LAB — T4, FREE: FREE T4: 1.32 ng/dL (ref 0.60–1.60)

## 2017-08-01 LAB — BASIC METABOLIC PANEL
BUN: 20 mg/dL (ref 6–23)
CO2: 31 meq/L (ref 19–32)
Calcium: 9.4 mg/dL (ref 8.4–10.5)
Chloride: 102 mEq/L (ref 96–112)
Creatinine, Ser: 0.92 mg/dL (ref 0.40–1.20)
GFR: 69.58 mL/min (ref >60.00)
GLUCOSE: 96 mg/dL (ref 70–99)
POTASSIUM: 4.3 meq/L (ref 3.5–5.1)
SODIUM: 138 meq/L (ref 135–145)

## 2017-08-01 LAB — HEMOGLOBIN A1C: HEMOGLOBIN A1C: 5.9 % (ref 4.6–6.5)

## 2017-08-01 LAB — TSH: TSH: 1.14 u[IU]/mL (ref 0.35–4.50)

## 2017-08-01 LAB — URIC ACID: Uric Acid, Serum: 5.5 mg/dL (ref 2.4–7.0)

## 2017-08-01 NOTE — Telephone Encounter (Signed)
Verbal order for CPE labs by PCP.

## 2017-08-01 NOTE — Progress Notes (Signed)
Rescheduled AWV; no charge for this encounter.

## 2017-08-06 ENCOUNTER — Ambulatory Visit (INDEPENDENT_AMBULATORY_CARE_PROVIDER_SITE_OTHER): Payer: Medicare Other | Admitting: Podiatry

## 2017-08-06 DIAGNOSIS — B351 Tinea unguium: Secondary | ICD-10-CM | POA: Diagnosis not present

## 2017-08-06 DIAGNOSIS — M79676 Pain in unspecified toe(s): Secondary | ICD-10-CM | POA: Diagnosis not present

## 2017-08-07 ENCOUNTER — Ambulatory Visit (INDEPENDENT_AMBULATORY_CARE_PROVIDER_SITE_OTHER): Payer: Medicare Other | Admitting: Pulmonary Disease

## 2017-08-07 ENCOUNTER — Encounter: Payer: Self-pay | Admitting: Pulmonary Disease

## 2017-08-07 VITALS — BP 112/70 | HR 56 | Ht <= 58 in | Wt 224.0 lb

## 2017-08-07 DIAGNOSIS — E662 Morbid (severe) obesity with alveolar hypoventilation: Secondary | ICD-10-CM

## 2017-08-07 DIAGNOSIS — J9611 Chronic respiratory failure with hypoxia: Secondary | ICD-10-CM

## 2017-08-07 DIAGNOSIS — G4733 Obstructive sleep apnea (adult) (pediatric): Secondary | ICD-10-CM

## 2017-08-07 NOTE — Patient Instructions (Signed)
Will arrange for overnight oxygen test with CPAP  Follow up in 1 year 

## 2017-08-07 NOTE — Progress Notes (Signed)
   SUBJECTIVE Patient presents to office today complaining of elongated, thickened nails that cause pain while ambulating in shoes. SHe is unable to trim her own nails. Patient is here for further evaluation and treatment.  Past Medical History:  Diagnosis Date  . Dementia due to another medical condition    Trisomy 21-related, sees Pearlean BrownieSethi yearly, nl CT head 2010, did not tolerate aricept  . Depression    with OCD  . Diabetes mellitus (HCC)    "just when she was on TPN"  . DJD (degenerative joint disease) of knee    bilat knee arthritis, had 3/3 Supartz injections [name](Kramer)  . Down syndrome   . 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 Eczema   . Empyema of left pleural space (HCC) 09/24/2011  . Hypothyroid   . OCD (obsessive compulsive disorder)   . On home oxygen therapy    "1L just at night" (09/25/2014)  . OSA on CPAP 05/2012   severe OSA with AHI 118, desat nadir to 67%  . Pneumonia    multiple pneumonias  . Presenile dementia, uncomplicated   . Seizure (HCC) 2013   x 2 ; "very mild"; during hospitalization "while coming off vent"  . Sleep-related hypoventilation 09/23/2012    OBJECTIVE General Patient is awake, alert, and oriented x 3 and in no acute distress. Derm Skin is dry and supple bilateral. Negative open lesions or macerations. Remaining integument unremarkable. Nails are tender, long, thickened and dystrophic with subungual debris, consistent with onychomycosis, 1-5 bilateral. No signs of infection noted. Vasc  DP and PT pedal pulses palpable bilaterally. Temperature gradient within normal limits.  Neuro Epicritic and protective threshold sensation grossly intact bilaterally.  Musculoskeletal Exam No symptomatic pedal deformities noted bilateral. Muscular strength within normal limits.  ASSESSMENT 1. Onychodystrophic nails 1-5 bilateral with hyperkeratosis of nails.  2. Onychomycosis of nail due to dermatophyte bilateral 3. Pain in foot bilateral  PLAN OF CARE 1. Patient evaluated  today.  2. Instructed to maintain good pedal hygiene and foot care.  3. Mechanical debridement of nails 1-5 bilaterally performed using a nail nipper. Filed with dremel without incident.  4. Return to clinic in 3 mos.    Felecia ShellingBrent M. Shadae Reino, DPM [facility]  Dr. Felecia ShellingBrent M. Aime Meloche, DPM    [address]                                        [place], [place] [address]                Office [phone]) [phone]  Fax [phone]

## 2017-08-07 NOTE — Progress Notes (Signed)
[facility]  Chief Complaint  Patient presents with  . Follow-up    pt is doing well overall with cpap machine. Pt would like to know if she still needs to con't O2 at bedtime.    Vital signs: BP 112/70 (BP Location: Left Arm, Cuff Size: Normal)   Pulse (!) 56   Ht 4\' 10"  (1.473 m)   Wt 224 lb (101.6 kg)   SpO2 97%   BMI 46.82 kg/m   History of Present Illness: [name] is a 47 y.o. female with severe OSA.  She has prior hx of pneumonia, empyema, and respiratory failure requiring tracheostomy.  She got new CPAP.  Uses nightly.  No issues with mask.  Sleeps through the night.  Not taking naps.  Still using oxygen at night with CPAP.  Physical Exam:  General - pleasant Eyes - pupils reactive, wears glasses ENT - no sinus tenderness, no oral exudate, no LAN, MP 3, trach scar Cardiac - regular, no murmur Chest - no wheeze, rales Abd - soft, non tender Ext - no edema Skin - changes of eczema Neuro - normal strength Psych - normal mood  Assessment/Plan:  Obstructive sleep apnea. - she is compliant with CPAP and reports benefit - continue auto CPAP  Chronic hypoxic respiratory failure 2nd to obesity hypoventilation syndrome. - 1 liter oxygen with CPAP at night - will repeat ONO with CPAP off O2 and determine if she can d/c home oxygen set up   Patient Instructions  Will arrange for overnight oxygen test with CPAP  Follow up in 1 year    Coralyn Helling, MD [facility] Pulmonary/Critical Care 08/07/2017, 10:41 AM Pager:  [phone]  Flow Sheet  Sleep tests: PSG 06/02/12 >> AHI 118.3, SpO2 low 67% ONO with CPAP and 1 liter 07/18/12 >> Test time 7 hrs 44 min.  Basal SpO2 96%, SpO2 low 81%.  Spent 0.7% of time with SpO2 < 88%. Auto CPAP 07/07/17 to 08/05/17 >> used on 30 of 30 nights with average 9 hrs 18 min.  Average AHI 0.8 with median CPAP 11 and 95 th percentile CPAP 12 cm H2O  Cardiac tests: Echo 09/27/14 >> EF 55 to  60%  Past Medical History: She  has a past medical history of Dementia due to another medical condition, Depression, Diabetes mellitus (HCC), DJD (degenerative joint disease) of knee, Down syndrome, Dry ARMD (02/2013), Eczema, Empyema of left pleural space (HCC) (09/24/2011), Hypothyroid, OCD (obsessive compulsive disorder), On home oxygen therapy, OSA on CPAP (05/2012), Pneumonia, Presenile dementia, uncomplicated, Seizure (HCC) (2013), and Sleep-related hypoventilation (09/23/2012).  Past Surgical History: She  has a past surgical history that includes Back surgery; Anterior cruciate ligament repair (Left); Flexible bronchoscopy (09/24/2011); Tracheostomy tube placement (10/11/2011); Video assisted thoracoscopy (vats) /thorocotomy/radioactive seed implant (Left, 09/24/2011); and Lumbar disc surgery.  Family History: Her family history includes Arthritis in her mother; Diabetes in her father; Hypertension in her father.  Social History: She  reports that she has never smoked. She has never used smokeless tobacco. She reports that she does not drink alcohol or use drugs.  Medications: Allergies as of 08/07/2017   No Known Allergies     Medication List        Accurate as of 08/07/17 10:41 AM. Always use your most recent med list.          albuterol (2.5 MG/3ML) 0.083% nebulizer solution Commonly known as:  PROVENTIL Take 3 mLs (2.5 mg total) by nebulization every 6 (  six) hours as needed for wheezing or shortness of breath.   allopurinol 100 MG tablet Commonly known as:  ZYLOPRIM Take 1 tablet (100 mg total) by mouth daily.   colchicine 0.6 MG tablet Take 1 tablet (0.6 mg total) by mouth daily as needed (gout flare).   levETIRAcetam 500 MG tablet Commonly known as:  KEPPRA TAKE 1 TABLET EVERY 12 HOURS   levothyroxine 125 MCG tablet Commonly known as:  SYNTHROID, LEVOTHROID TAKE 1 TABLET BY MOUTH DAILY BEFORE BREAKFAST.   meloxicam 15 MG tablet Commonly known as:  MOBIC Take 15 mg  by mouth daily.   memantine 10 MG tablet Commonly known as:  NAMENDA Take 1 tablet (10 mg total) by mouth 2 (two) times daily.   mupirocin ointment 2 % Commonly known as:  BACTROBAN APPLY TWICE DAILY.   OXYGEN Inhale 1 L into the lungs at bedtime. Via CPAP   PROBIOTIC PO Take 1 tablet by mouth daily.   WOMENS MULTI PO Take 1 tablet by mouth daily.

## 2017-08-08 ENCOUNTER — Ambulatory Visit (INDEPENDENT_AMBULATORY_CARE_PROVIDER_SITE_OTHER): Payer: Medicare Other

## 2017-08-08 VITALS — BP 114/72 | HR 50 | Temp 97.9°F | Ht <= 58 in | Wt 229.5 lb

## 2017-08-08 DIAGNOSIS — Z0000 Encounter for general adult medical examination without abnormal findings: Secondary | ICD-10-CM

## 2017-08-08 NOTE — Progress Notes (Signed)
Subjective:   [name] is a 47 y.o. female who presents for Medicare Annual (Subsequent) preventive examination.  Review of Systems:  N/A       Objective:     Vitals: BP 114/72 (BP Location: Left Arm, Patient Position: Sitting, Cuff Size: Large)   Pulse (!) 50   Temp 97.9 F (36.6 C) (Oral)   Ht 4\' 10"  (1.473 m) Comment: shoes  Wt 229 lb 8 oz (104.1 kg)   SpO2 97%   BMI 47.97 kg/m   Body mass index is 47.97 kg/m.  Advanced Directives 08/08/2017 09/25/2014 09/25/2014 01/24/2014 09/24/2011  Does Patient Have a Medical Advance Directive? No No No No Patient does not have advance directive  Would patient like information on creating a medical advance directive? No - Patient declined No - patient declined information - - -  Pre-existing out of facility DNR order (yellow form or pink MOST form) - - - - No    Tobacco Social History   Tobacco Use  Smoking Status Never Smoker  Smokeless Tobacco Never Used     Counseling given: No   Clinical Intake:  Pre-visit preparation completed: Yes  Pain : No/denies pain Pain Score: 6      Nutritional Status: BMI > 30  Obese Nutritional Risks: None Diabetes: No  How often do you need to have someone help you when you read instructions, pamphlets, or other written materials from your doctor or pharmacy?: 1 - Never What is the last grade level you completed in school?: 12th grade  Interpreter Needed?: No  Comments: pt lives with mother Information entered by :: LPinson, LPN  Past Medical History:  Diagnosis Date  . Dementia due to another medical condition    Trisomy 21-related, sees Pearlean BrownieSethi yearly, nl CT head 2010, did not tolerate aricept  . Depression    with OCD  . Diabetes mellitus (HCC)    "just when she was on TPN"  . DJD (degenerative joint disease) of knee    bilat knee arthritis, had 3/3 Supartz injections [name](Kramer)  . Down syndrome   . 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 Eczema   . Empyema of left pleural space (HCC)  09/24/2011  . Hypothyroid   . OCD (obsessive compulsive disorder)   . On home oxygen therapy    "1L just at night" (09/25/2014)  . OSA on CPAP 05/2012   severe OSA with AHI 118, desat nadir to 67%  . Pneumonia    multiple pneumonias  . Presenile dementia, uncomplicated   . Seizure (HCC) 2013   x 2 ; "very mild"; during hospitalization "while coming off vent"  . Sleep-related hypoventilation 09/23/2012   Past Surgical History:  Procedure Laterality Date  . ACHILLES TENDON REPAIR Right 01/2017   Dr. Nena PolioEvans/[facility]  . ANTERIOR CRUCIATE LIGAMENT REPAIR Left   . BACK SURGERY    . FLEXIBLE BRONCHOSCOPY  09/24/2011   Procedure: FLEXIBLE BRONCHOSCOPY;  Surgeon: Purcell Nailslarence H Owen, MD;  [facility];  Service: Thoracic;  Laterality: N/A;  . LUMBAR DISC SURGERY     ruptured disc  . TRACHEOSTOMY TUBE PLACEMENT  10/11/2011   Procedure: TRACHEOSTOMY;  Surgeon: Serena ColonelJefry Rosen, MD;  [facility];  Service: ENT;  Laterality: N/A;  . VIDEO ASSISTED THORACOSCOPY (VATS) /THOROCOTOMY/RADIOACTIVE SEED IMPLANT Left 09/24/2011   VATS, (Dr. Barry Dieneswens)   Family History  Problem Relation Age of Onset  . Arthritis Mother   . Hypertension Father   . Diabetes Father   . Cancer  Neg Hx   . Coronary artery disease Neg Hx   . Stroke Neg Hx    Social History   Socioeconomic History  . Marital status: Single    Spouse name: Not on file  . Number of children: 0  . Years of education: Not on file  . Highest education level: Not on file  Occupational History  . Not on file  Social Needs  . Financial resource strain: Not on file  . Food insecurity:    Worry: Not on file    Inability: Not on file  . Transportation needs:    Medical: Not on file    Non-medical: Not on file  Tobacco Use  . Smoking status: Never Smoker  . Smokeless tobacco: Never Used  Substance and Sexual Activity  . Alcohol use: No  . Drug use: No  . Sexual activity: Never  Lifestyle  . Physical activity:    Days per week: Not  on file    Minutes per session: Not on file  . Stress: Not on file  Relationships  . Social connections:    Talks on phone: Not on file    Gets together: Not on file    Attends religious service: Not on file    Active member of club or organization: Not on file    Attends meetings of clubs or organizations: Not on file    Relationship status: Not on file  Other Topics Concern  . Not on file  Social History Narrative   Lives with mom and dad, dogs   Stays at daycare during the day   Activity: walking some   Diet: good water, fruits/vegetables daily    Outpatient Encounter Medications as of 08/08/2017  Medication Sig  . albuterol (PROVENTIL) (2.5 MG/3ML) 0.083% nebulizer solution Take 3 mLs (2.5 mg total) by nebulization every 6 (six) hours as needed for wheezing or shortness of breath.  . allopurinol (ZYLOPRIM) 100 MG tablet Take 1 tablet (100 mg total) by mouth daily.  . clobetasol cream (TEMOVATE) 0.05 % Apply topically.  . colchicine 0.6 MG tablet Take 1 tablet (0.6 mg total) by mouth daily as needed (gout flare).  Marland Kitchen ketoconazole (NIZORAL) 2 % shampoo   . levETIRAcetam (KEPPRA) 500 MG tablet TAKE 1 TABLET EVERY 12 HOURS  . levothyroxine (SYNTHROID, LEVOTHROID) 125 MCG tablet TAKE 1 TABLET BY MOUTH DAILY BEFORE BREAKFAST.  . memantine (NAMENDA) 10 MG tablet Take 1 tablet (10 mg total) by mouth 2 (two) times daily.  . Multiple Vitamins-Minerals (WOMENS MULTI PO) Take 1 tablet by mouth daily.   . Naproxen (NAPROSYN PO) Take by mouth as needed.  . OXYGEN Inhale 1 L into the lungs at bedtime. Via CPAP  . [DISCONTINUED] meloxicam (MOBIC) 15 MG tablet Take 15 mg by mouth daily.  . [DISCONTINUED] mupirocin ointment (BACTROBAN) 2 % APPLY TWICE DAILY.  . [DISCONTINUED] Probiotic Product (PROBIOTIC PO) Take 1 tablet by mouth daily.   No facility-administered encounter medications on file as of 08/08/2017.     Activities of Daily Living In your present state of health, do you have any  difficulty performing the following activities: 08/08/2017  Hearing? N  Vision? N  Difficulty concentrating or making decisions? Y  Walking or climbing stairs? N  Dressing or bathing? N  Doing errands, shopping? Y  Preparing Food and eating ? Y  Using the Toilet? N  In the past six months, have you accidently leaked urine? N  Do you have problems with loss of bowel  control? N  Managing your Medications? Y  Managing your Finances? Y  Housekeeping or managing your Housekeeping? Y  Some recent data might be hidden    Patient Care Team: Eustaquio Boyden, MD as PCP - General (Family Medicine) Arville Go, MD as Referring Physician (Internal Medicine)    Assessment:   This is a routine wellness examination for [name].   Hearing Screening   125Hz  250Hz  500Hz  1000Hz  2000Hz  3000Hz  4000Hz  6000Hz  8000Hz   Right ear:   40 40 40  40    Left ear:   40 40 40  40    Vision Screening Comments: March 2019 with Dr. Jenkins Rouge  Exercise Activities and Dietary recommendations Current Exercise Habits: Home exercise routine, Type of exercise: Other - see comments(swimming), Time (Minutes): > 60(2 hrs), Frequency (Times/Week): 5, Weekly Exercise (Minutes/Week): 0, Intensity: Moderate, Exercise limited by: None identified  Goals    . Patient Stated     Starting 08/08/2017, I will continue to take medications as prescribed.        Fall Risk Fall Risk  08/08/2017 09/08/2016 09/08/2016 09/27/2015 07/13/2014  Falls in the past year? No No Yes No No   Depression Screen PHQ 2/9 Scores 08/08/2017 09/08/2016 09/27/2015 07/13/2014  PHQ - 2 Score 0 0 0 0  PHQ- 9 Score 0 - - -     Cognitive Function MMSE - Mini Mental State Exam 08/08/2017  Not completed: (No Data)  DX: presenile dementia      Immunization History  Administered Date(s) Administered  . Influenza Split 12/05/2011  . Influenza, Seasonal, Injecte, Preservative Fre 02/13/2013  . Influenza,inj,Quad PF,6+ Mos 12/04/2013, 11/19/2014, 11/17/2015,  12/14/2016  . Pneumococcal Polysaccharide-23 12/05/2011  . Tdap 02/13/2013   Screening Tests Health Maintenance  Topic Date Due  . HIV Screening  08/09/2018 (Originally 09/11/1985)  . PAP SMEAR  08/08/2048 (Originally 09/12/1991)  . INFLUENZA VACCINE  09/27/2017  . MAMMOGRAM  01/11/2018  . DTaP/Tdap/Td (2 - Td) 02/14/2023  . TETANUS/TDAP  02/14/2023      Plan:     I have personally reviewed, addressed, and noted the following in the patient's chart:  A. Medical and social history B. Use of alcohol, tobacco or illicit drugs  C. Current medications and supplements D. Functional ability and status E.  Nutritional status F.  Physical activity G. Advance directives H. List of other physicians I.  Hospitalizations, surgeries, and ER visits in previous 12 months J.  Vitals K. Screenings to include hearing, vision, cognitive, depression L. Referrals and appointments - none  In addition, I have reviewed and discussed with patient certain preventive protocols, quality metrics, and best practice recommendations. A written personalized care plan for preventive services as well as general preventive health recommendations were provided to patient.  See attached scanned questionnaire for additional information.   Signed,   Randa Evens, MHA, BS, LPN Health Coach

## 2017-08-08 NOTE — Progress Notes (Signed)
   Subjective:    Patient ID: [name], female    DOB: 05-Jun-1970, 47 y.o.   MRN: [id number]  HPI  I reviewed health advisor's note, was available for consultation, and agree with documentation and plan.   Review of Systems     Objective:   Physical Exam         Assessment & Plan:

## 2017-08-08 NOTE — Patient Instructions (Signed)
Ms. Christell ConstantMoore , Thank you for taking time to come for your Medicare Wellness Visit. I appreciate your ongoing commitment to your health goals. Please review the following plan we discussed and let me know if I can assist you in the future.   These are the goals we discussed: Goals    . Patient Stated     Starting 08/08/2017, I will continue to take medications as prescribed.        This is a list of the screening recommended for you and due dates:  Health Maintenance  Topic Date Due  . HIV Screening  08/09/2018*  . Pap Smear  08/08/2048*  . Flu Shot  09/27/2017  . Mammogram  01/11/2018  . DTaP/Tdap/Td vaccine (2 - Td) 02/14/2023  . Tetanus Vaccine  02/14/2023  *Topic was postponed. The date shown is not the original due date.   Preventive Care for Adults  A healthy lifestyle and preventive care can promote health and wellness. Preventive health guidelines for adults include the following key practices.  . A routine yearly physical is a good way to check with your health care provider about your health and preventive screening. It is a chance to share any concerns and updates on your health and to receive a thorough exam.  . Visit your dentist for a routine exam and preventive care every 6 months. Brush your teeth twice a day and floss once a day. Good oral hygiene prevents tooth decay and gum disease.  . The frequency of eye exams is based on your age, health, family medical history, use  of contact lenses, and other factors. Follow your health care provider's recommendations for frequency of eye exams.  . Eat a healthy diet. Foods like vegetables, fruits, whole grains, low-fat dairy products, and lean protein foods contain the nutrients you need without too many calories. Decrease your intake of foods high in solid fats, added sugars, and salt. Eat the right amount of calories for you. Get information about a proper diet from your health care provider, if necessary.  . Regular  physical exercise is one of the most important things you can do for your health. Most adults should get at least 150 minutes of moderate-intensity exercise (any activity that increases your heart rate and causes you to sweat) each week. In addition, most adults need muscle-strengthening exercises on 2 or more days a week.  Silver Sneakers may be a benefit available to you. To determine eligibility, you may visit the website: www.silversneakers.com or contact program at [phone] Mon-Fri between 8AM-8PM.   . Maintain a healthy weight. The body mass index (BMI) is a screening tool to identify possible weight problems. It provides an estimate of body fat based on height and weight. Your health care provider can find your BMI and can help you achieve or maintain a healthy weight.   For adults 20 years and older: ? A BMI below 18.5 is considered underweight. ? A BMI of 18.5 to 24.9 is normal. ? A BMI of 25 to 29.9 is considered overweight. ? A BMI of 30 and above is considered obese.   . Maintain normal blood lipids and cholesterol levels by exercising and minimizing your intake of saturated fat. Eat a balanced diet with plenty of fruit and vegetables. Blood tests for lipids and cholesterol should begin at age 47 and be repeated every 5 years. If your lipid or cholesterol levels are high, you are over 50, or you are at high risk for heart disease, you  may need your cholesterol levels checked more frequently. Ongoing high lipid and cholesterol levels should be treated with medicines if diet and exercise are not working.  . If you smoke, find out from your health care provider how to quit. If you do not use tobacco, please do not start.  . If you choose to drink alcohol, please do not consume more than 2 drinks per day. One drink is considered to be 12 ounces (355 mL) of beer, 5 ounces (148 mL) of wine, or 1.5 ounces (44 mL) of liquor.  . If you are 43-79 years old, ask your health care provider if  you should take aspirin to prevent strokes.  . Use sunscreen. Apply sunscreen liberally and repeatedly throughout the day. You should seek shade when your shadow is shorter than you. Protect yourself by wearing long sleeves, pants, a wide-brimmed hat, and sunglasses year round, whenever you are outdoors.  . Once a month, do a whole body skin exam, using a mirror to look at the skin on your back. Tell your health care provider of new moles, moles that have irregular borders, moles that are larger than a pencil eraser, or moles that have changed in shape or color.

## 2017-08-08 NOTE — Progress Notes (Signed)
PCP notes:   Health maintenance:  HIV screening - pt declined PAP smear - pt declined  Abnormal screenings:   None  Patient concerns:   Left knee pain.   Nurse concerns:  None  Next PCP appt:   08/15/17 @ 1230

## 2017-08-15 ENCOUNTER — Encounter: Payer: Self-pay | Admitting: Family Medicine

## 2017-08-15 ENCOUNTER — Ambulatory Visit (INDEPENDENT_AMBULATORY_CARE_PROVIDER_SITE_OTHER): Payer: Medicare Other | Admitting: Family Medicine

## 2017-08-15 VITALS — BP 124/68 | HR 54 | Temp 97.8°F | Ht <= 58 in | Wt 228.2 lb

## 2017-08-15 DIAGNOSIS — D7589 Other specified diseases of blood and blood-forming organs: Secondary | ICD-10-CM | POA: Diagnosis not present

## 2017-08-15 DIAGNOSIS — R569 Unspecified convulsions: Secondary | ICD-10-CM

## 2017-08-15 DIAGNOSIS — G4733 Obstructive sleep apnea (adult) (pediatric): Secondary | ICD-10-CM | POA: Diagnosis not present

## 2017-08-15 DIAGNOSIS — M25562 Pain in left knee: Secondary | ICD-10-CM

## 2017-08-15 DIAGNOSIS — R7303 Prediabetes: Secondary | ICD-10-CM

## 2017-08-15 DIAGNOSIS — F331 Major depressive disorder, recurrent, moderate: Secondary | ICD-10-CM

## 2017-08-15 DIAGNOSIS — Z7189 Other specified counseling: Secondary | ICD-10-CM | POA: Diagnosis not present

## 2017-08-15 DIAGNOSIS — Z0000 Encounter for general adult medical examination without abnormal findings: Secondary | ICD-10-CM | POA: Diagnosis not present

## 2017-08-15 DIAGNOSIS — Z9989 Dependence on other enabling machines and devices: Secondary | ICD-10-CM

## 2017-08-15 DIAGNOSIS — M1A0690 Idiopathic chronic gout, unspecified knee, without tophus (tophi): Secondary | ICD-10-CM | POA: Diagnosis not present

## 2017-08-15 DIAGNOSIS — F0390 Unspecified dementia without behavioral disturbance: Secondary | ICD-10-CM | POA: Diagnosis not present

## 2017-08-15 DIAGNOSIS — E039 Hypothyroidism, unspecified: Secondary | ICD-10-CM

## 2017-08-15 DIAGNOSIS — Q909 Down syndrome, unspecified: Secondary | ICD-10-CM

## 2017-08-15 DIAGNOSIS — Z6841 Body Mass Index (BMI) 40.0 and over, adult: Secondary | ICD-10-CM

## 2017-08-15 MED ORDER — CLOBETASOL PROPIONATE 0.05 % EX CREA: TOPICAL_CREAM | Freq: Two times a day (BID) | CUTANEOUS | 1 refills | Status: DC

## 2017-08-15 NOTE — Assessment & Plan Note (Signed)
Stable. Consider periph smear next labwork.

## 2017-08-15 NOTE — Assessment & Plan Note (Signed)
Reviewed A1c with pt and mother. Encouraged avoiding added sugars in diet.

## 2017-08-15 NOTE — Assessment & Plan Note (Signed)
Preventative protocols reviewed and updated unless pt declined. Discussed healthy diet and lifestyle.  

## 2017-08-15 NOTE — Assessment & Plan Note (Signed)
Followed by pulm - appreciate their care. 

## 2017-08-15 NOTE — Assessment & Plan Note (Signed)
Continues namenda 10mg  bid - stable period. Actually doing better since she stopped going to adult daycare.

## 2017-08-15 NOTE — Progress Notes (Signed)
BP 124/68 (BP Location: Left Arm, Patient Position: Sitting, Cuff Size: Large)   Pulse (!) 54   Temp 97.8 F (36.6 C) (Oral)   Ht 4\' 9"  (1.448 m)   Wt 228 lb 4 oz (103.5 kg)   LMP 08/13/2017   SpO2 97%   BMI 49.39 kg/m    CC: CPE Subjective:    Patient ID: [name], female    DOB: 11/11/1970, 47 y.o.   MRN: [id number]  HPI: [name] is a 47 y.o. female presenting on 08/15/2017 for Annual Exam (Pt 2.)   Saw [name] last week for medicare wellness visit. Note reviewed.   OSA stable on CPAP followed by [name]. Also receives 1L O2 with CPAP at night.  Ongoing foot pain s/p podiatry eval.  No longer going to day care - doing better.   Preventative: Regular periods - LMP currently - becoming less frequent.  Well woman - discussed with mom.Doesn't want pap smear or pelvic exam or breast exam.  Mammogram WNL 12/2016 Flu shot - yearly  Pneumovax 2013.  Tdap - 01/2013.  Advanced directives: mom would be HCPOA but this has not been set up yet - mom will work on this. Has paperwork at home.  Seat belt use discussed.  Sunscreen use discussed. No changing moles.  Non smoker  Alcohol - none Dentist - Q58mo Eye exam regularlylMother hasn't noticed any trouble with memory.  Lives with mom and dad, dogs Stays at daycare during the day Activity: walking some Diet: some water, likes coke zero, fruits/vegetables daily - encouraged limited sugar/carbs  Relevant past medical, surgical, family and social history reviewed and updated as indicated. Interim medical history since our last visit reviewed. Allergies and medications reviewed and updated. Outpatient Medications Prior to Visit  Medication Sig Dispense Refill  . albuterol (PROVENTIL) (2.5 MG/3ML) 0.083% nebulizer solution Take 3 mLs (2.5 mg total) by nebulization every 6 (six) hours as needed for wheezing or shortness of breath. 75 mL 5  . allopurinol (ZYLOPRIM) 100 MG tablet Take 1 tablet (100 mg total) by mouth daily. 90  tablet 3  . colchicine 0.6 MG tablet Take 1 tablet (0.6 mg total) by mouth daily as needed (gout flare). 30 tablet 1  . ketoconazole (NIZORAL) 2 % shampoo     . levETIRAcetam (KEPPRA) 500 MG tablet TAKE 1 TABLET EVERY 12 HOURS 180 tablet 3  . levothyroxine (SYNTHROID, LEVOTHROID) 125 MCG tablet TAKE 1 TABLET BY MOUTH DAILY BEFORE BREAKFAST. 90 tablet 4  . memantine (NAMENDA) 10 MG tablet Take 1 tablet (10 mg total) by mouth 2 (two) times daily. 180 tablet 3  . Multiple Vitamins-Minerals (WOMENS MULTI PO) Take 1 tablet by mouth daily.     . Naproxen (NAPROSYN PO) Take by mouth as needed.    . OXYGEN Inhale 1 L into the lungs at bedtime. Via CPAP    . clobetasol cream (TEMOVATE) 0.05 % Apply topically.     No facility-administered medications prior to visit.      Per HPI unless specifically indicated in ROS section below Review of Systems  Constitutional: Negative for activity change, appetite change, chills, fatigue, fever and unexpected weight change.  HENT: Negative for hearing loss.   Eyes: Negative for visual disturbance.  Respiratory: Negative for cough, chest tightness, shortness of breath and wheezing.   Cardiovascular: Negative for chest pain, palpitations and leg swelling.  Gastrointestinal: Negative for abdominal distention, abdominal pain, blood in stool, constipation, diarrhea, nausea and vomiting.  Genitourinary:  Negative for difficulty urinating and hematuria.  Musculoskeletal: Negative for arthralgias, myalgias and neck pain.       L knee pain  Skin: Negative for rash.  Neurological: Negative for dizziness, seizures, syncope and headaches.  Hematological: Negative for adenopathy. Does not bruise/bleed easily.  Psychiatric/Behavioral: Negative for dysphoric mood. The patient is not nervous/anxious.        Objective:    BP 124/68 (BP Location: Left Arm, Patient Position: Sitting, Cuff Size: Large)   Pulse (!) 54   Temp 97.8 F (36.6 C) (Oral)   Ht 4\' 9"  (1.448 m)    Wt 228 lb 4 oz (103.5 kg)   LMP 08/13/2017   SpO2 97%   BMI 49.39 kg/m   Wt Readings from Last 3 Encounters:  08/15/17 228 lb 4 oz (103.5 kg)  08/08/17 229 lb 8 oz (104.1 kg)  08/07/17 224 lb (101.6 kg)    Physical Exam  Constitutional: She is oriented to person, place, and time. She appears well-developed and well-nourished. No distress.  HENT:  Head: Normocephalic and atraumatic.  Right Ear: Hearing, tympanic membrane, external ear and ear canal normal.  Left Ear: Hearing, tympanic membrane, external ear and ear canal normal.  Nose: Nose normal.  Mouth/Throat: Uvula is midline, oropharynx is clear and moist and mucous membranes are normal. No oropharyngeal exudate, posterior oropharyngeal edema or posterior oropharyngeal erythema.  Eyes: Pupils are equal, round, and reactive to light. Conjunctivae and EOM are normal. No scleral icterus.  Neck: Normal range of motion. Neck supple.  Cardiovascular: Normal rate, regular rhythm, normal heart sounds and intact distal pulses.  No murmur heard. Pulses:      Radial pulses are 2+ on the right side, and 2+ on the left side.  Pulmonary/Chest: Effort normal and breath sounds normal. No respiratory distress. She has no wheezes. She has no rales.  Abdominal: Soft. Bowel sounds are normal. She exhibits distension. She exhibits no mass. There is no tenderness. There is no rebound and no guarding.  Musculoskeletal: Normal range of motion. She exhibits no edema.  R knee WNL L knee exam: No deformity on inspection. Some swelling and warmth. ++ pain with palpation of anterior knee on both sides of patellar tendon FROM in flex/extension without crepitus but marked pain with full extension. No popliteal fullness. + pain with mcmurray test. + pain with valgus/varus stress.  Lymphadenopathy:    She has no cervical adenopathy.  Neurological: She is alert and oriented to person, place, and time.  CN grossly intact, station and gait intact  Skin: Skin  is warm and dry. No rash noted.  Psychiatric: She has a normal mood and affect. Her behavior is normal. Judgment and thought content normal.  Nursing note and vitals reviewed.  Results for orders placed or performed in visit on 08/01/17  Basic Metabolic Panel  Result Value Ref Range   Sodium 138 135 - 145 mEq/L   Potassium 4.3 3.5 - 5.1 mEq/L   Chloride 102 96 - 112 mEq/L   CO2 31 19 - 32 mEq/L   Glucose, Bld 96 70 - 99 mg/dL   BUN 20 6 - 23 mg/dL   Creatinine, Ser 1.61 0.40 - 1.20 mg/dL   Calcium 9.4 8.4 - 09.6 mg/dL   GFR 04.54 >09.81 mL/min  Uric acid  Result Value Ref Range   Uric Acid, Serum 5.5 2.4 - 7.0 mg/dL  TSH  Result Value Ref Range   TSH 1.14 0.35 - 4.50 uIU/mL  T4, Free  Result  Value Ref Range   Free T4 1.32 0.60 - 1.60 ng/dL  CBC with Differential/Platelet  Result Value Ref Range   WBC 5.8 4.0 - 10.5 K/uL   RBC 4.03 3.87 - 5.11 Mil/uL   Hemoglobin 14.0 12.0 - 15.0 g/dL   HCT 84.1 32.4 - 40.1 %   MCV 103.7 (H) 78.0 - 100.0 fl   MCHC 33.4 30.0 - 36.0 g/dL   RDW 02.7 25.3 - 66.4 %   Platelets 224.0 150.0 - 400.0 K/uL   Neutrophils Relative % 70.1 43.0 - 77.0 %   Lymphocytes Relative 17.4 12.0 - 46.0 %   Monocytes Relative 8.4 3.0 - 12.0 %   Eosinophils Relative 1.8 0.0 - 5.0 %   Basophils Relative 2.3 0.0 - 3.0 %   Neutro Abs 4.0 1.4 - 7.7 K/uL   Lymphs Abs 1.0 0.7 - 4.0 K/uL   Monocytes Absolute 0.5 0.1 - 1.0 K/uL   Eosinophils Absolute 0.1 0.0 - 0.7 K/uL   Basophils Absolute 0.1 0.0 - 0.1 K/uL  Hemoglobin A1c  Result Value Ref Range   Hgb A1c MFr Bld 5.9 4.6 - 6.5 %   Lab Results  Component Value Date   ALT 35 08/29/2016   AST 26 08/29/2016   ALKPHOS 46 08/29/2016   BILITOT 0.3 08/29/2016      Assessment & Plan:   Problem List Items Addressed This Visit    Seizure (HCC)    Isolated seizures x2 during hospitalization with critical illness. Continue keppra.       Presenile dementia, uncomplicated    Continues namenda 10mg  bid - stable  period. Actually doing better since she stopped going to adult daycare.      Prediabetes    Reviewed A1c with pt and mother. Encouraged avoiding added sugars in diet.       OSA on CPAP    Followed by pulm - appreciate their care.      Morbid obesity with BMI of 45.0-49.9, adult (HCC)    Encouraged healthy diet in ongoing effort to attain sustainable weight loss. Activity limited by knee pain.       MDD (major depressive disorder), recurrent episode, moderate (HCC)    Stable period off meds - doing better after she stopped attending daycare.      Macrocytosis without anemia    Stable. Consider periph smear next labwork.       Hypothyroid    Chronic, stable.       Health maintenance examination - Primary    Preventative protocols reviewed and updated unless pt declined. Discussed healthy diet and lifestyle.       Down syndrome   Chronic gout    With possible acute flare - rec start colchicine daily, continue allopurinol. She did recently eat shrimp prior to knee pain exacerbation.       Advanced care planning/counseling discussion    Advanced directives: mom would be HCPOA but this has not been set up yet - mom will work on this. Has paperwork at home.        Other Visit Diagnoses    Acute pain of left knee           Meds ordered this encounter  Medications  . clobetasol cream (TEMOVATE) 0.05 %    Sig: Apply topically 2 (two) times daily. 2 weeks at a time    Dispense:  45 g    Refill:  1   No orders of the defined types were placed in this encounter.  Follow up plan: Return in about 1 year (around 08/16/2018) for annual exam, prior fasting for blood work, medicare wellness visit.  Eustaquio BoydenJavier Mazal Ebey, MD

## 2017-08-15 NOTE — Patient Instructions (Addendum)
Work on [employer], [employer]/product.  I do think she has gout flare of left knee - may take colchicine daily for next several days to see if any improvement.  Gout Gout is painful swelling that can occur in some of your joints. Gout is a type of arthritis. This condition is caused by having too much uric acid in your body. Uric acid is a chemical that forms when your body breaks down substances called purines. Purines are important for building body proteins. When your body has too much uric acid, sharp crystals can form and build up inside your joints. This causes pain and swelling. Gout attacks can happen quickly and be very painful (acute gout). Over time, the attacks can affect more joints and become more frequent (chronic gout). Gout can also cause uric acid to build up under your skin and inside your kidneys. What are the causes? This condition is caused by too much uric acid in your blood. This can occur because:  Your kidneys do not remove enough uric acid from your blood. This is the most common cause.  Your body makes too much uric acid. This can occur with some cancers and cancer treatments. It can also occur if your body is breaking down too many red blood cells (hemolytic anemia).  You eat too many foods that are high in purines. These foods include organ meats and some seafood. Alcohol, especially beer, is also high in purines.  A gout attack may be triggered by trauma or stress. What increases the risk? This condition is more likely to develop in people who:  Have a family history of gout.  Are female and middle-aged.  Are female and have gone through menopause.  Are obese.  Frequently drink alcohol, especially beer.  Are dehydrated.  Lose weight too quickly.  Have an organ transplant.  Have lead poisoning.  Take certain medicines, including aspirin, cyclosporine, diuretics, levodopa, and niacin.  Have kidney disease or psoriasis.  What are the signs or symptoms? An  attack of acute gout happens quickly. It usually occurs in just one joint. The most common place is the big toe. Attacks often start at night. Other joints that may be affected include joints of the feet, ankle, knee, fingers, wrist, or elbow. Symptoms may include:  Severe pain.  Warmth.  Swelling.  Stiffness.  Tenderness. The affected joint may be very painful to touch.  Shiny, red, or purple skin.  Chills and fever.  Chronic gout may cause symptoms more frequently. More joints may be involved. You may also have white or yellow lumps (tophi) on your hands or feet or in other areas near your joints. How is this diagnosed? This condition is diagnosed based on your symptoms, medical history, and physical exam. You may have tests, such as:  Blood tests to measure uric acid levels.  Removal of joint fluid with a needle (aspiration) to look for uric acid crystals.  X-rays to look for joint damage.  How is this treated? Treatment for this condition has two phases: treating an acute attack and preventing future attacks. Acute gout treatment may include medicines to reduce pain and swelling, including:  NSAIDs.  Steroids. These are strong anti-inflammatory medicines that can be taken by mouth (orally) or injected into a joint.  Colchicine. This medicine relieves pain and swelling when it is taken soon after an attack. It can be given orally or through an IV tube.  Preventive treatment may include:  Daily use of smaller doses of NSAIDs or  colchicine.  Use of a medicine that reduces uric acid levels in your blood.  Changes to your diet. You may need to see a specialist about healthy eating (dietitian).  Follow these instructions at home: During a Gout Attack  If directed, apply ice to the affected area: ? Put ice in a plastic bag. ? Place a towel between your skin and the bag. ? Leave the ice on for 20 minutes, 2-3 times a day.  Rest the joint as much as possible. If the  affected joint is in your leg, you may be given crutches to use.  Raise (elevate) the affected joint above the level of your heart as often as possible.  Drink enough fluids to keep your urine clear or pale yellow.  Take over-the-counter and prescription medicines only as told by your health care provider.  Do not drive or operate heavy machinery while taking prescription pain medicine.  Follow instructions from your health care provider about eating or drinking restrictions.  Return to your normal activities as told by your health care provider. Ask your health care provider what activities are safe for you. Avoiding Future Gout Attacks  Follow a low-purine diet as told by your dietitian or health care provider. Avoid foods and drinks that are high in purines, including liver, kidney, anchovies, asparagus, herring, mushrooms, mussels, and beer.  Limit alcohol intake to no more than 1 drink a day for nonpregnant women and 2 drinks a day for men. One drink equals 12 oz of beer, 5 oz of wine, or 1 oz of hard liquor.  Maintain a healthy weight or lose weight if you are overweight. If you want to lose weight, talk with your health care provider. It is important that you do not lose weight too quickly.  Start or maintain an exercise program as told by your health care provider.  Drink enough fluids to keep your urine clear or pale yellow.  Take over-the-counter and prescription medicines only as told by your health care provider.  Keep all follow-up visits as told by your health care provider. This is important. Contact a health care provider if:  You have another gout attack.  You continue to have symptoms of a gout attack after10 days of treatment.  You have side effects from your medicines.  You have chills or a fever.  You have burning pain when you urinate.  You have pain in your lower back or belly. Get help right away if:  You have severe or uncontrolled pain.  You  cannot urinate. This information is not intended to replace advice given to you by your health care provider. Make sure you discuss any questions you have with your health care provider. Document Released: 02/11/2000 Document Revised: 07/22/2015 Document Reviewed: 11/26/2014 Elsevier Interactive Patient Education  [employer]2018 [employer]

## 2017-08-15 NOTE — Assessment & Plan Note (Signed)
With possible acute flare - rec start colchicine daily, continue allopurinol. She did recently eat shrimp prior to knee pain exacerbation.

## 2017-08-15 NOTE — Assessment & Plan Note (Addendum)
Isolated seizures x2 during hospitalization with critical illness. Continue keppra.

## 2017-08-15 NOTE — Assessment & Plan Note (Signed)
Stable period off meds - doing better after she stopped attending daycare.

## 2017-08-15 NOTE — Assessment & Plan Note (Signed)
Advanced directives: mom would be HCPOA but this has not been set up yet - mom will work on this. Has paperwork at home.  

## 2017-08-15 NOTE — Assessment & Plan Note (Signed)
Chronic, stable 

## 2017-08-15 NOTE — Assessment & Plan Note (Signed)
Encouraged healthy diet in ongoing effort to attain sustainable weight loss. Activity limited by knee pain.

## 2017-08-19 ENCOUNTER — Other Ambulatory Visit: Payer: Self-pay | Admitting: Family Medicine

## 2017-08-22 ENCOUNTER — Telehealth: Payer: Self-pay | Admitting: Pulmonary Disease

## 2017-08-22 DIAGNOSIS — G4733 Obstructive sleep apnea (adult) (pediatric): Secondary | ICD-10-CM

## 2017-08-22 NOTE — Telephone Encounter (Signed)
Pt completed ONO on 08-18-17 Rec'd ONO results from [facility] by fax today on 08-22-2017 Placed in VS green to do folder in VS cubby today.

## 2017-09-03 NOTE — Telephone Encounter (Signed)
Following up with VS if ONO has been resulted.  VS please advise.  

## 2017-09-06 NOTE — Telephone Encounter (Signed)
lmtcb x1 for pt. 

## 2017-09-06 NOTE — Telephone Encounter (Signed)
ONO with CPAP 08/18/17 >> test tim 6 hrs 53 min.  Baseline SpO2 96%, low SpO2 82%.  Spent 5 mins 44 sec with SpO2 < 88%.   Please let her know her oxygen level looks good with CPAP.  She doesn't need supplemental oxygen at night.  Please have her oxygen set up d/c'ed.

## 2017-09-06 NOTE — Telephone Encounter (Signed)
Called and spoke to patient's mother, Lurena JoinerRebecca who [place]. Gave results per Dr. Craige CottaSood of ONO. Lurena JoinerRebecca verbalized understanding. Placed order for oxygen to be picked up since it's no longer needed. Nothing further needed at this time.

## 2017-09-06 NOTE — Telephone Encounter (Signed)
Patient mother [name]([name] [phone]) returned phone call

## 2017-09-24 ENCOUNTER — Encounter: Payer: Self-pay | Admitting: Family Medicine

## 2017-09-24 DIAGNOSIS — M1712 Unilateral primary osteoarthritis, left knee: Secondary | ICD-10-CM | POA: Insufficient documentation

## 2017-11-09 ENCOUNTER — Ambulatory Visit: Payer: Medicare Other | Admitting: Podiatry

## 2017-11-26 ENCOUNTER — Encounter: Payer: Self-pay | Admitting: Nurse Practitioner

## 2017-11-26 ENCOUNTER — Ambulatory Visit: Payer: Medicare Other | Admitting: Acute Care

## 2017-11-26 ENCOUNTER — Ambulatory Visit (INDEPENDENT_AMBULATORY_CARE_PROVIDER_SITE_OTHER): Payer: Medicare Other | Admitting: Nurse Practitioner

## 2017-11-26 VITALS — BP 118/68 | HR 69 | Ht <= 58 in | Wt 227.2 lb

## 2017-11-26 DIAGNOSIS — R05 Cough: Secondary | ICD-10-CM | POA: Diagnosis not present

## 2017-11-26 DIAGNOSIS — Z9989 Dependence on other enabling machines and devices: Secondary | ICD-10-CM | POA: Diagnosis not present

## 2017-11-26 DIAGNOSIS — G4733 Obstructive sleep apnea (adult) (pediatric): Secondary | ICD-10-CM

## 2017-11-26 DIAGNOSIS — R059 Cough, unspecified: Secondary | ICD-10-CM

## 2017-11-26 MED ORDER — OMEPRAZOLE 20 MG PO CPDR: 20.0000 mg | DELAYED_RELEASE_CAPSULE | Freq: Every day | ORAL | 11 refills | Status: DC

## 2017-11-26 NOTE — Patient Instructions (Addendum)
Will start Prilosec - concerned Cough may be related to reflux at night Will have DME company check machine, tubing, mask, and settings Continue CPAP Follow up with Dr. Craige Cotta in 1 month or sooner if needed  Note: patient's father refuses Prilosec. Wants her to take zantac. This will be o.k. for a trial.

## 2017-11-26 NOTE — Assessment & Plan Note (Signed)
Patient Instructions  Will start Prilosec - concerned Cough may be related to reflux at night Will have DME company check machine, tubing, mask, and settings Continue CPAP Follow up with Dr. Craige Cotta in 1 month or sooner if needed  Note: patient's father refuses Prilosec. Wants her to take zantac. This will be o.k. for a trial.

## 2017-11-26 NOTE — Addendum Note (Signed)
Addended by: Ander Slade on: 11/26/2017 04:50 PM   Modules accepted: Orders

## 2017-11-26 NOTE — Assessment & Plan Note (Signed)
Patient Instructions  Will start Prilosec - concerned Cough may be related to reflux at night Will have DME company check machine, tubing, mask, and settings Continue CPAP Follow up with Dr. Sood in 1 month or sooner if needed  Note: patient's father refuses Prilosec. Wants her to take zantac. This will be o.k. for a trial.    

## 2017-11-26 NOTE — Progress Notes (Deleted)
History of Present Illness [name] is a 47 y.o. female never smoker with severe OSA on CPAP. She has prior hx of [name], pneumonia, empyema, and respiratory failure requiring tracheostomy.She is followed by Dr. Craige Cotta.   11/26/2017 Acute OV for Cough: Pt. Presents for acute OV with her mother. She does have a history of bronchitis. She is compliant with her CPAP daily.  Test Results: Sleep tests: PSG 06/02/12 >> AHI 118.3, SpO2 low 67% ONO with CPAP and 1 liter 07/18/12 >>Test time 7 hrs 44 min. Basal SpO2 96%, SpO2 low 81%. Spent 0.7% of time with SpO2 <88%. Auto CPAP 06/15/15 to 07/14/15 >>used on 30 of 30 nights with average 8 hrs and 19 min. Average AHI is 2.1 with median CPAP 10 cm H2O and 95 th percentile CPAP 12 cm H20.  Cardiac tests: Echo 09/27/14 >> EF 55 to 60%  CBC Latest Ref Rng & Units 08/01/2017 08/29/2016 09/27/2015  WBC 4.0 - 10.5 K/uL 5.8 4.7 5.4  Hemoglobin 12.0 - 15.0 g/dL 14.7 82.9 56.2  Hematocrit 36.0 - 46.0 % 41.8 42.7 41.2  Platelets 150.0 - 400.0 K/uL 224.0 183.0 227.0    BMP Latest Ref Rng & Units 08/01/2017 08/29/2016 09/27/2015  Glucose 70 - 99 mg/dL 96 130(Q) 95  BUN 6 - 23 mg/dL 20 15 16   Creatinine 0.40 - 1.20 mg/dL 6.57 8.46 9.62  Sodium 135 - 145 mEq/L 138 138 137  Potassium 3.5 - 5.1 mEq/L 4.3 4.3 4.2  Chloride 96 - 112 mEq/L 102 103 102  CO2 19 - 32 mEq/L 31 31 30   Calcium 8.4 - 10.5 mg/dL 9.4 8.9 9.0    BNP No results found for: BNP  ProBNP    Component Value Date/Time   PROBNP 486.8 (H) 10/11/2011 0525    PFT No results found for: FEV1PRE, FEV1POST, FVCPRE, FVCPOST, TLC, DLCOUNC, PREFEV1FVCRT, PSTFEV1FVCRT  No results found.   Past medical hx Past Medical History:  Diagnosis Date  . Dementia due to another medical condition    Trisomy 21-related, sees [name] yearly, nl CT head 2010, did not tolerate aricept  . Depression    with OCD  . Diabetes mellitus (HCC)    "just when she was on TPN"  . DJD (degenerative  joint disease) of knee    bilat knee arthritis, had 3/3 Supartz injections [name])  . Down syndrome   . 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 Eczema   . Empyema of left pleural space (HCC) 09/24/2011  . Hypothyroid   . OCD (obsessive compulsive disorder)   . On home oxygen therapy    "1L just at night" (09/25/2014)  . OSA on CPAP 05/2012   severe OSA with AHI 118, desat nadir to 67%  . Pneumonia    multiple pneumonias  . Presenile dementia, uncomplicated   . Seizure (HCC) 2013   x 2 ; "very mild"; during hospitalization "while coming off vent"  . Sleep-related hypoventilation 09/23/2012     Social History   Tobacco Use  . Smoking status: Never Smoker  . Smokeless tobacco: Never Used  Substance Use Topics  . Alcohol use: No  . Drug use: No    Ms.Asbill reports that she has never smoked. She has never used smokeless tobacco. She reports that she does not drink alcohol or use drugs.  Tobacco Cessation: Never smoker   Past surgical hx, Family hx, Social hx all reviewed.  Current Outpatient Medications on File Prior to Visit  Medication Sig  . albuterol (  PROVENTIL) (2.5 MG/3ML) 0.083% nebulizer solution Take 3 mLs (2.5 mg total) by nebulization every 6 (six) hours as needed for wheezing or shortness of breath.  . allopurinol (ZYLOPRIM) 100 MG tablet Take 1 tablet (100 mg total) by mouth daily.  . clobetasol cream (TEMOVATE) 0.05 % Apply topically 2 (two) times daily. 2 weeks at a time  . colchicine 0.6 MG tablet TAKE 1 TABLET BY MOUTH EVERY DAY AS NEEDED FOR GOUT FLARE  . ketoconazole (NIZORAL) 2 % shampoo   . levETIRAcetam (KEPPRA) 500 MG tablet TAKE 1 TABLET EVERY 12 HOURS  . levothyroxine (SYNTHROID, LEVOTHROID) 125 MCG tablet TAKE 1 TABLET BY MOUTH DAILY BEFORE BREAKFAST.  . memantine (NAMENDA) 10 MG tablet Take 1 tablet (10 mg total) by mouth 2 (two) times daily.  . Multiple Vitamins-Minerals (WOMENS MULTI PO) Take 1 tablet by mouth daily.   . Naproxen (NAPROSYN PO) Take by mouth as needed.    . OXYGEN Inhale 1 L into the lungs at bedtime. Via CPAP   No current facility-administered medications on file prior to visit.      No Known Allergies  Review Of Systems:  Constitutional:   No  weight loss, night sweats,  Fevers, chills, fatigue, or  lassitude.  HEENT:   No headaches,  Difficulty swallowing,  Tooth/dental problems, or  Sore throat,                No sneezing, itching, ear ache, nasal congestion, post nasal drip,   CV:  No chest pain,  Orthopnea, PND, swelling in lower extremities, anasarca, dizziness, palpitations, syncope.   GI  No heartburn, indigestion, abdominal pain, nausea, vomiting, diarrhea, change in bowel habits, loss of appetite, bloody stools.   Resp: No shortness of breath with exertion or at rest.  No excess mucus, no productive cough,  No non-productive cough,  No coughing up of blood.  No change in color of mucus.  No wheezing.  No chest wall deformity  Skin: no rash or lesions.  GU: no dysuria, change in color of urine, no urgency or frequency.  No flank pain, no hematuria   MS:  No joint pain or swelling.  No decreased range of motion.  No back pain.  Psych:  No change in mood or affect. No depression or anxiety.  No memory loss.   Vital Signs There were no vitals taken for this visit.   Physical Exam:  General- No distress,  A&Ox3 ENT: No sinus tenderness, TM clear, pale nasal mucosa, no oral exudate,no post nasal drip, no LAN Cardiac: S1, S2, regular rate and rhythm, no murmur Chest: No wheeze/ rales/ dullness; no accessory muscle use, no nasal flaring, no sternal retractions Abd.: Soft Non-tender Ext: No clubbing cyanosis, edema Neuro:  normal strength Skin: No rashes, warm and dry Psych: normal mood and behavior   Assessment/Plan  No problem-specific Assessment & Plan notes found for this encounter.    Bevelyn Ngo, NP 11/26/2017  9:33 AM

## 2017-11-26 NOTE — Progress Notes (Signed)
@Patient  ID: [name], female    DOB: [birth year], 46 y.o.   MRN: [id number]  Chief Complaint  Patient presents with  . Cough    Referring provider: Eustaquio Boyden, MD   HPI 47 year old female with severe OSA followed by Dr. Craige Cotta.  She has prior history of pneumonia, empyema, and respiratory failure requiring tracheostomy.   Tests: PSG 06/02/12 >> AHI 118.3, SpO2 low 67% ONO with CPAP and 1 liter 07/18/12 >>Test time 7 hrs 44 min. Basal SpO2 96%, SpO2 low 81%. Spent 0.7% of time with SpO2 <88%. Auto CPAP 07/07/17 to 08/05/17 >> used on 30 of 30 nights with average 9 hrs 18 min.  Average AHI 0.8 with median CPAP 11 and 95 th percentile CPAP 12 cm H2O  Cardiac tests: Echo 09/27/14 >> EF 55 to 60%  OV - 11/26/17 - cough Patient presents today with cough. Her father brings her to the appointment today. He states that she has developed a cough since her O2 at night was recently stopped after most recent ONO.  Cough is bad at night when using CPAP. Does not cough during the day. Father states he is certain that this has started due to oxygen being stopped after recent ONO. Father states that she does not have any congestion, shortness of breath, or fever.    No Known Allergies  Immunization History  Administered Date(s) Administered  . Influenza Split 12/05/2011  . Influenza, Seasonal, Injecte, Preservative Fre 02/13/2013  . Influenza,inj,Quad PF,6+ Mos 12/04/2013, 11/19/2014, 11/17/2015, 12/14/2016  . Pneumococcal Polysaccharide-23 12/05/2011  . Tdap 02/13/2013    Past Medical History:  Diagnosis Date  . Dementia due to another medical condition    Trisomy 21-related, sees [name] yearly, nl CT head 2010, did not tolerate aricept  . Depression    with OCD  . Diabetes mellitus (HCC)    "just when she was on TPN"  . DJD (degenerative joint disease) of knee    bilat knee arthritis, had 3/3 Supartz injections [name])  . Down syndrome   . 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 Eczema   .  Empyema of left pleural space (HCC) 09/24/2011  . Hypothyroid   . OCD (obsessive compulsive disorder)   . On home oxygen therapy    "1L just at night" (09/25/2014)  . OSA on CPAP 05/2012   severe OSA with AHI 118, desat nadir to 67%  . Pneumonia    multiple pneumonias  . Presenile dementia, uncomplicated   . Seizure (HCC) 2013   x 2 ; "very mild"; during hospitalization "while coming off vent"  . Sleep-related hypoventilation 09/23/2012    Tobacco History: Social History   Tobacco Use  Smoking Status Never Smoker  Smokeless Tobacco Never Used   Counseling given: Yes   Outpatient Encounter Medications as of 11/26/2017  Medication Sig  . albuterol (PROVENTIL) (2.5 MG/3ML) 0.083% nebulizer solution Take 3 mLs (2.5 mg total) by nebulization every 6 (six) hours as needed for wheezing or shortness of breath.  . allopurinol (ZYLOPRIM) 100 MG tablet Take 1 tablet (100 mg total) by mouth daily.  . clobetasol cream (TEMOVATE) 0.05 % Apply topically 2 (two) times daily. 2 weeks at a time  . colchicine 0.6 MG tablet TAKE 1 TABLET BY MOUTH EVERY DAY AS NEEDED FOR GOUT FLARE  . ketoconazole (NIZORAL) 2 % shampoo   . levETIRAcetam (KEPPRA) 500 MG tablet TAKE 1 TABLET EVERY 12 HOURS  . levothyroxine (SYNTHROID, LEVOTHROID) 125 MCG tablet TAKE 1 TABLET  BY MOUTH DAILY BEFORE BREAKFAST.  . memantine (NAMENDA) 10 MG tablet Take 1 tablet (10 mg total) by mouth 2 (two) times daily.  . Multiple Vitamins-Minerals (WOMENS MULTI PO) Take 1 tablet by mouth daily.   . Naproxen (NAPROSYN PO) Take by mouth as needed.  Marland Kitchen omeprazole (PRILOSEC) 20 MG capsule Take 1 capsule (20 mg total) by mouth daily.  . OXYGEN Inhale 1 L into the lungs at bedtime. Via CPAP   No facility-administered encounter medications on file as of 11/26/2017.      Review of Systems  Review of Systems  Constitutional: Negative.  Negative for activity change, appetite change, chills and fever.  HENT: Negative.  Negative for congestion  and sinus pain.   Respiratory: Positive for cough. Negative for shortness of breath and wheezing.   Cardiovascular: Negative.  Negative for chest pain, palpitations and leg swelling.  Gastrointestinal: Negative.   Allergic/Immunologic: Negative.   Neurological: Negative.   Psychiatric/Behavioral: Negative.        Physical Exam  BP 118/68 (BP Location: Left Arm, Patient Position: Sitting, Cuff Size: Normal)   Pulse 69   Ht 4\' 10"  (1.473 m)   Wt 227 lb 3.2 oz (103.1 kg)   SpO2 99%   BMI 47.48 kg/m   Wt Readings from Last 5 Encounters:  11/26/17 227 lb 3.2 oz (103.1 kg)  08/15/17 228 lb 4 oz (103.5 kg)  08/08/17 229 lb 8 oz (104.1 kg)  08/07/17 224 lb (101.6 kg)  05/07/17 230 lb (104.3 kg)     Physical Exam  Constitutional: She is oriented to person, place, and time. She appears well-developed and well-nourished. No distress.  Cardiovascular: Normal rate and regular rhythm.  Pulmonary/Chest: Effort normal and breath sounds normal. No respiratory distress. She has no wheezes.  Musculoskeletal: She exhibits no edema.  Neurological: She is alert and oriented to person, place, and time.  Psychiatric: She has a normal mood and affect.  Nursing note and vitals reviewed.     Assessment & Plan:   Cough Patient Instructions  Will start Prilosec - concerned Cough may be related to reflux at night Will have DME company check machine, tubing, mask, and settings Continue CPAP Follow up with Dr. Craige Cotta in 1 month or sooner if needed  Note: patient's father refuses Prilosec. Wants her to take zantac. This will be o.k. for a trial.     OSA on CPAP Patient Instructions  Will start Prilosec - concerned Cough may be related to reflux at night Will have DME company check machine, tubing, mask, and settings Continue CPAP Follow up with Dr. Craige Cotta in 1 month or sooner if needed  Note: patient's father refuses Prilosec. Wants her to take zantac. This will be o.k. for a trial.         Ivonne Andrew, NP 11/26/2017

## 2017-11-27 NOTE — Progress Notes (Signed)
Reviewed and agree with assessment/plan.   Aline Wesche, MD [facility] 02/23/2016, 12:24 PM Pager:  [phone]  

## 2017-12-10 ENCOUNTER — Other Ambulatory Visit (HOSPITAL_BASED_OUTPATIENT_CLINIC_OR_DEPARTMENT_OTHER): Payer: Medicare Other

## 2017-12-14 ENCOUNTER — Other Ambulatory Visit: Payer: Self-pay | Admitting: Family Medicine

## 2017-12-18 NOTE — Telephone Encounter (Signed)
Electronic refill Clobetasol cream Last office visit 08/15/17  45 G/1 refill Last office visit 08/15/17

## 2017-12-24 ENCOUNTER — Telehealth: Payer: Self-pay | Admitting: Pulmonary Disease

## 2017-12-24 DIAGNOSIS — Z9989 Dependence on other enabling machines and devices: Principal | ICD-10-CM

## 2017-12-24 DIAGNOSIS — G4733 Obstructive sleep apnea (adult) (pediatric): Secondary | ICD-10-CM

## 2017-12-24 NOTE — Telephone Encounter (Signed)
LMTCB. Order was not placed to have cpap machine and tubing checked. Order placed. Left message at [facility] for return phone call.

## 2017-12-25 ENCOUNTER — Ambulatory Visit: Payer: Medicare Other | Admitting: Podiatry

## 2017-12-25 NOTE — Telephone Encounter (Signed)
Attempted to call [name] but unable to reach her. Left message for [name] to return call

## 2017-12-26 ENCOUNTER — Ambulatory Visit: Payer: Medicare Other | Admitting: Pulmonary Disease

## 2017-12-26 NOTE — Telephone Encounter (Signed)
LVM for patient to return call regarding cpap machine with [facility]. X2 LVM with [facility] at [phone] ext [phone] resp. Dept to check status of pts cpap machine and supplies X1

## 2017-12-27 ENCOUNTER — Ambulatory Visit: Payer: Medicare Other | Admitting: Podiatry

## 2017-12-27 NOTE — Telephone Encounter (Signed)
Spoke with Sharyl Nimrod at [facility] and she states there are no notes on her account regarding this matter but thinks Barbara Cower may be working on this but he is out of the office until next week. She stated she would call the patient to discuss and to see if they can help her while Barbara Cower is gone. Will hold in triage to follow up to make sure pt received service from [facility].

## 2017-12-28 ENCOUNTER — Ambulatory Visit (INDEPENDENT_AMBULATORY_CARE_PROVIDER_SITE_OTHER): Payer: Medicare Other | Admitting: Podiatry

## 2017-12-28 DIAGNOSIS — B351 Tinea unguium: Secondary | ICD-10-CM | POA: Diagnosis not present

## 2017-12-28 DIAGNOSIS — M79676 Pain in unspecified toe(s): Secondary | ICD-10-CM | POA: Diagnosis not present

## 2017-12-28 NOTE — Patient Instructions (Signed)

## 2017-12-31 NOTE — Telephone Encounter (Signed)
LVM for patient to return call regarding cpap machine and supplies with [facility] Pt needs to call Sharyl Nimrod with [facility] at [phone] ext [phone] to schedule appt

## 2017-12-31 NOTE — Telephone Encounter (Signed)
Called patient and left a message to follow up on this.

## 2018-01-01 ENCOUNTER — Telehealth: Payer: Self-pay | Admitting: Nurse Practitioner

## 2018-01-01 NOTE — Telephone Encounter (Signed)
lmtcb x2 for pt. 

## 2018-01-01 NOTE — Telephone Encounter (Signed)
Called mother, [name] who stated her daughter who has Downs syndrome suddenly came to her earlier today and complained of a sound in her head and feeling like she was going to pass out. The mother stated she had her sit down, and it went away. [name] has an appointment in this office today and stated she didn't know what to do. This RN advised she cannot determine from a call what is happening with her daughter. The patient thought her appt here was at 2:30, but this RN checked and advised her it is at 1:30 pm, the current time is 1:00 pm. The mother stated she will come on to her appt and bring her daughter with her, and if her daughter complained again she would decide whether to take her to ED.  She verbalized understanding, appreciation of call.

## 2018-01-01 NOTE — Telephone Encounter (Signed)
Pts mother([name] on DPR) called stating the pt advised there is a "sound" on the right side of her head and feels she may pass out. [name] requesting a call unsure if the pt should go to the ED or if these could be symptoms of anything please advise

## 2018-01-02 NOTE — Telephone Encounter (Signed)
Attempted to call patient today regarding results. I have left a voicemail message for pt to return call. X3 We have left three messages for pt to call our office back regarding results. No response at this time, a letter has been placed in mail today for pt to call our office. Mailed letter of communication today. Nothing further needed at this time.  Patient needs to call [facility] ask for Sharyl Nimrod at [phone] ext [phone] To set up appt for new cpap machine and supplies

## 2018-01-02 NOTE — Telephone Encounter (Signed)
Called Barbara Cower and left a message to give [place] a call back. Called patients mother, unable to reach left message to give [place] a call back x3.

## 2018-01-16 ENCOUNTER — Encounter: Payer: Self-pay | Admitting: Podiatry

## 2018-01-16 NOTE — Progress Notes (Signed)
Subjective: "My knee hurts. It hurts right here when I walk." [name] presents today for follow up with cc of painful, discolored, thick toenails which interfere with daily activities.  Pain is aggravated when wearing enclosed shoe gear. Pain is getting progressively worse and relieved with periodic professional debridement.  Patient also complaining of left knee pain when ambulating on today. She denies any trauma to knee. She does have h/o bilateral knee OA.   Past Medical History:  Diagnosis Date  . Dementia due to another medical condition    Trisomy 21-related, sees [name] yearly, nl CT head 2010, did not tolerate aricept  . Depression    with OCD  . Diabetes mellitus (HCC)    "just when she was on TPN"  . DJD (degenerative joint disease) of knee    bilat knee arthritis, had 3/3 Supartz injections [name])  . Down syndrome   . 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 Eczema   . Empyema of left pleural space (HCC) 09/24/2011  . Hypothyroid   . OCD (obsessive compulsive disorder)   . On home oxygen therapy    "1L just at night" (09/25/2014)  . OSA on CPAP 05/2012   severe OSA with AHI 118, desat nadir to 67%  . Pneumonia    multiple pneumonias  . Presenile dementia, uncomplicated   . Seizure (HCC) 2013   x 2 ; "very mild"; during hospitalization "while coming off vent"  . Sleep-related hypoventilation 09/23/2012   Current Outpatient Medications:  .  albuterol (PROVENTIL) (2.5 MG/3ML) 0.083% nebulizer solution, Take 3 mLs (2.5 mg total) by nebulization every 6 (six) hours as needed for wheezing or shortness of breath., Disp: 75 mL, Rfl: 5 .  allopurinol (ZYLOPRIM) 100 MG tablet, Take 1 tablet (100 mg total) by mouth daily., Disp: 90 tablet, Rfl: 3 .  clobetasol cream (TEMOVATE) 0.05 %, APPLY TO AFFECTED AREA TWICE A DAY. 2 WEEKS AT A TIME, Disp: 45 g, Rfl: 1 .  colchicine 0.6 MG tablet, TAKE 1 TABLET BY MOUTH EVERY DAY AS NEEDED FOR GOUT FLARE, Disp: 30 tablet, Rfl: 5 .  ketoconazole (NIZORAL) 2 %  shampoo, , Disp: , Rfl:  .  levETIRAcetam (KEPPRA) 500 MG tablet, TAKE 1 TABLET EVERY 12 HOURS, Disp: 180 tablet, Rfl: 3 .  levothyroxine (SYNTHROID, LEVOTHROID) 125 MCG tablet, TAKE 1 TABLET BY MOUTH DAILY BEFORE BREAKFAST., Disp: 90 tablet, Rfl: 4 .  memantine (NAMENDA) 10 MG tablet, Take 1 tablet (10 mg total) by mouth 2 (two) times daily., Disp: 180 tablet, Rfl: 3 .  Multiple Vitamins-Minerals (WOMENS MULTI PO), Take 1 tablet by mouth daily. , Disp: , Rfl:  .  Naproxen (NAPROSYN PO), Take by mouth as needed., Disp: , Rfl:  .  omeprazole (PRILOSEC) 20 MG capsule, Take 1 capsule (20 mg total) by mouth daily., Disp: 30 capsule, Rfl: 11 .  OXYGEN, Inhale 1 L into the lungs at bedtime. Via CPAP, Disp: , Rfl:   No Known Allergies   Social History   Tobacco Use  Smoking Status Never Smoker  Smokeless Tobacco Never Used    Objective: Vascular Examination: Capillary refill time <3 seconds x 10 digits Dorsalis pedis and posterior tibial pulses present b/l No digital hair x 10 digits Skin temperature warm to warm b/l  Dermatological Examination: Skin with normal turgor, texture and tone b/l  Toenails 1-5 b/l discolored, thick, dystrophic with subungual debris and pain with palpation to nailbeds due to thickness of nails.  Musculoskeletal: Muscle strength 5/5 to all  LE muscle groups  Neurological: Sensation intact with 10 gram monofilament. Vibratory sensation intact.  Assessment: Painful onychomycosis toenails 1-5 b/l   Plan: 1. Regarding knee pain, patient's POA advised to seek treatment with Orthopedist. 2. Toenails 1-5 b/l were debrided in length and girth without iatrogenic bleeding. 3. Patient to continue soft, supportive shoe gear 4. Patient to report any pedal injuries to medical professional immediately. 5. Follow up 3 months. Patient/POA to call should there be a concern in the interim.

## 2018-02-12 ENCOUNTER — Telehealth: Payer: Self-pay | Admitting: Family Medicine

## 2018-02-12 DIAGNOSIS — M1712 Unilateral primary osteoarthritis, left knee: Secondary | ICD-10-CM

## 2018-02-12 NOTE — Telephone Encounter (Signed)
pts mom called back; since 02/11/18 pt has not been able to get out of bed; pt is hurting and pts mom request cb ASAP. pts mom said if referral had been put in today that [facility] could have [facility]. 08/15/17 annual exam mentioned pt having acute pain in lt knee. I advised pts mom that Dr Sharen HonesGutierrez is seeing pts and Misty StanleyLisa CMA is aware of pts need and will speak with Dr Sharen HonesGutierrez about pt. pts mom voiced understanding and request cb ASAP.

## 2018-02-12 NOTE — Addendum Note (Signed)
Addended by: Eustaquio BoydenGUTIERREZ, Ednah Hammock on: 02/12/2018 06:11 PM   Modules accepted: Orders

## 2018-02-12 NOTE — Telephone Encounter (Signed)
[name] called office stating her daughter is seen at [facility]. The pt is needing to get surgery on her knee but needs a referral. Pt was offered a appointment for tomorrow with Dr.Gutierrez because there is no openings today but the pt's mother feels she can't wait to tomorrow. She has been receiving injections in her knee. The pt can't even get out of the bed. The pt has reached the maximum number of visits at [facility] so they can not provide the referral. [name] is requesting a call. Best cb number is [phone].

## 2018-02-12 NOTE — Telephone Encounter (Addendum)
I am just getting to this message now. Referral placed. Hopefully they can be seen tomorrow.

## 2018-02-13 ENCOUNTER — Ambulatory Visit: Payer: Medicare Other | Admitting: Family Medicine

## 2018-02-13 NOTE — Telephone Encounter (Signed)
Appt made for today with Dr Dion SaucierLandau , patients mother notified.

## 2018-02-24 NOTE — Progress Notes (Signed)
[facility]  PATIENT: [name] DOB: [birth year]   REASON FOR VISIT: Follow-up for seizure disorder, [name] and memory loss HISTORY FROM: Patient and mom    HISTORY OF PRESENT ILLNESS:UPDATE 12/30/2019CM [name], 47 year old female returns for follow-up with history of seizure disorder Down syndrome and memory loss.  She also has a history of obstructive sleep apnea but her CPAP is followed by Dr. Craige CottaSood.  She remains on Keppra 500 twice daily without further seizure activity.  She is on Namenda 10 mg twice daily and her memory is stable according to the mother.  She is having problems with her left knee and needs to have knee replacement however she needs to lose weight prior to the surgery according to mom.  She no longer goes to adult daycare but helps her mom around the house.  No behavior issues..  She returns for reevaluation   UPDATE 12/26/2018CM [name], 47 year old female returns for follow-up with a history of Down syndrome and cognitive decline over several years.  She also has a seizure disorder which is currently well controlled on Keppra 500 mg twice daily.  She remains on Namenda 10 mg twice daily.  She gets little exercise and is overweight.  She continues to go to adult care 5 days a week.  She has a history of obstructive sleep apnea and is on CPAP with additional oxygen at night.  No recent behavior issues.  She has a history of gout.  She had recent ankle surgery for a torn tendon and is wearing a boot on the right foot.  No recent behavior issues.  Returns for reevaluation UPDATE 12/15/2015 CM [name], 47 year old female returns for followup with her mother. She has a history of Downs syndrome and  cognitive decline over several years as well as seizure disorder. She is currently well controlled on Keppra and she is on Namenda without side effects to either drug. She is overweight, gets little exercise, lives at home. She goes to adult daycare  5 days a week . She has been having a lot of abdominal pain and diarrhea, she has appointment with GI in December.  She occasionally has behavior problems and was recently placed on Zoloft however it made her irritability worse and she is slowly titrating off this. She needs refills on Keppra and Namenda . She returns for reevaluation Update 10/14/16PS : She returns for follow-up after last visit a year ago. She is a complaint by sister who provides most of the history. She continues to do well and has not had any breakthrough seizures now for a few years. Her memory and cognitive difficulties remain unchanged. She requires close supervision at home and does spend the day at the daycare center. She can feed dress herself and bathe herself. She is never left alone. There have been no issues with agitation, behavior, delusions or hallucinations or safety concerns. Her gait imbalance remained stable and she has had no falls.  HISTORY: [name] is a [age] year year old Caucasian lady with lifelong history of Down`s syndrome and cognitive decline in last several years from dementia who is referred back to see need for a new problem of seizures. She had recent [facility] pneumonia with empyema and ventilator dependent respiratory failure. She underwent video assisted thoracotomy for empyma drainage. She required prolonged antibiotics and was eventually required tracheostomy. She had 2 brief episodes of what sound like complex partial  seizures one week apart in September 2013. Her mother who is present today was eyewitness to both episodes. She was described as staring and unresponsive not following commands with a distant look on her face lasting a few minutes followed by a period of disorientation and tiredness. In the second episode she had some clonic activity in both her upper extremities. She was started on Keppra 500 twice a day falling and  abnormal EEG on 11/04/2011 and with showed sharp waves in the left occipital parietal region. She was initially off her Namenda and Prozac in the hospital stay and had some behavioral agitation but settled down after the medicines were restarted. She has done well since discharge and has not had any more seizures. She seems to be tolerating Keppra quite well without any side effects. She is finished a course of antibiotics and home physical and occupation therapy. She is back living at home with mom and plans to start attending adult daycare soon. She apparently did not have any brain imaging study done.  03/11/12: [name] returns for followup. She was last seen by Dr. [name] 12/11/11: She had prolonged hospitilization in Sept 2013 and had seizure events. She was started on Keppra at that time. No further seizure events. MRI of the brain 12/18/11 was normal. EEG was abnormal suggestive of mild focal irritability in right frontal and temporal regions but no definite epileptiform  activity is noted. ROS neg.    REVIEW OF SYSTEMS: Full 14 system review of systems performed and notable only for those listed, all others are neg:  Constitutional: neg  Cardiovascular: neg Ear/Nose/Throat: neg  Skin: neg Eyes: neg Respiratory: neg Gastroitestinal: neg Hematology/Lymphatic: neg  Endocrine: neg Musculoskeletal:neg Allergy/Immunology: neg Neurological: History of seizure disorder, Down syndrome, memory decline Psychiatric: Agitation at times Sleep : neg   ALLERGIES: No Known Allergies  HOME MEDICATIONS: Outpatient Medications Prior to Visit  Medication Sig Dispense Refill  . albuterol (PROVENTIL) (2.5 MG/3ML) 0.083% nebulizer solution Take 3 mLs (2.5 mg total) by nebulization every 6 (six) hours as needed for wheezing or shortness of breath. 75 mL 5  . allopurinol (ZYLOPRIM) 100 MG tablet Take 1 tablet (100 mg total) by mouth daily. 90 tablet 3  . clobetasol cream (TEMOVATE) 0.05 % APPLY TO AFFECTED  AREA TWICE A DAY. 2 WEEKS AT A TIME 45 g 1  . colchicine 0.6 MG tablet TAKE 1 TABLET BY MOUTH EVERY DAY AS NEEDED FOR GOUT FLARE 30 tablet 5  . ketoconazole (NIZORAL) 2 % shampoo     . levETIRAcetam (KEPPRA) 500 MG tablet TAKE 1 TABLET EVERY 12 HOURS 180 tablet 3  . levothyroxine (SYNTHROID, LEVOTHROID) 125 MCG tablet TAKE 1 TABLET BY MOUTH DAILY BEFORE BREAKFAST. 90 tablet 4  . memantine (NAMENDA) 10 MG tablet Take 1 tablet (10 mg total) by mouth 2 (two) times daily. 180 tablet 3  . Multiple Vitamins-Minerals (WOMENS MULTI PO) Take 1 tablet by mouth daily.     . Naproxen (NAPROSYN PO) Take by mouth as needed.    Marland Kitchen omeprazole (PRILOSEC) 20 MG capsule Take 1 capsule (20 mg total) by mouth daily. 30 capsule 11  . OXYGEN Inhale 1 L into the lungs at bedtime. Via CPAP     No facility-administered medications prior to visit.     PAST MEDICAL HISTORY: Past Medical History:  Diagnosis Date  . Dementia due to another medical condition [facility])    Trisomy 21-related, sees Sethi yearly, nl CT head 2010, did not tolerate aricept  .  Depression    with OCD  . Diabetes mellitus (HCC)    "just when she was on TPN"  . DJD (degenerative joint disease) of knee    bilat knee arthritis, had 3/3 Supartz injections [name]([facility])  . Down syndrome   . 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 Eczema   . Empyema of left pleural space (HCC) 09/24/2011  . Hypothyroid   . OCD (obsessive compulsive disorder)   . On home oxygen therapy    "1L just at night" (09/25/2014)  . OSA on CPAP 05/2012   severe OSA with AHI 118, desat nadir to 67%  . Pneumonia    multiple pneumonias  . Presenile dementia, uncomplicated (HCC)   . Seizure (HCC) 2013   x 2 ; "very mild"; during hospitalization "while coming off vent"  . Sleep-related hypoventilation 09/23/2012    PAST SURGICAL HISTORY: Past Surgical History:  Procedure Laterality Date  . ACHILLES TENDON REPAIR Right 01/2017   Dr. Nena PolioEvans/[facility]  . ANTERIOR CRUCIATE LIGAMENT REPAIR Left    . BACK SURGERY    . FLEXIBLE BRONCHOSCOPY  09/24/2011   Procedure: FLEXIBLE BRONCHOSCOPY;  Surgeon: Purcell Nailslarence H Owen, MD;  [facility];  Service: Thoracic;  Laterality: N/A;  . LUMBAR DISC SURGERY     ruptured disc  . TRACHEOSTOMY TUBE PLACEMENT  10/11/2011   Procedure: TRACHEOSTOMY;  Surgeon: Serena ColonelJefry Rosen, MD;  [facility];  Service: ENT;  Laterality: N/A;  . VIDEO ASSISTED THORACOSCOPY (VATS) /THOROCOTOMY/RADIOACTIVE SEED IMPLANT Left 09/24/2011   VATS, (Dr. Barry Dieneswens)    FAMILY HISTORY: Family History  Problem Relation Age of Onset  . Arthritis Mother   . Hypertension Father   . Diabetes Father   . Cancer Neg Hx   . Coronary artery disease Neg Hx   . Stroke Neg Hx     SOCIAL HISTORY: Social History   Socioeconomic History  . Marital status: Single    Spouse name: Not on file  . Number of children: 0  . Years of education: Not on file  . Highest education level: Not on file  Occupational History  . Not on file  Social Needs  . Financial resource strain: Not on file  . Food insecurity:    Worry: Not on file    Inability: Not on file  . Transportation needs:    Medical: Not on file    Non-medical: Not on file  Tobacco Use  . Smoking status: Never Smoker  . Smokeless tobacco: Never Used  Substance and Sexual Activity  . Alcohol use: No  . Drug use: No  . Sexual activity: Never  Lifestyle  . Physical activity:    Days per week: Not on file    Minutes per session: Not on file  . Stress: Not on file  Relationships  . Social connections:    Talks on phone: Not on file    Gets together: Not on file    Attends religious service: Not on file    Active member of club or organization: Not on file    Attends meetings of clubs or organizations: Not on file    Relationship status: Not on file  . Intimate partner violence:    Fear of current or ex partner: Not on file    Emotionally abused: Not on file    Physically abused: Not on file    Forced sexual activity:  Not on file  Other Topics Concern  . Not on file  Social History Narrative   Lives  with mom and dad, dogs   Stays at daycare during the day   Activity: walking some   Diet: good water, fruits/vegetables daily     PHYSICAL EXAM  Vitals:   02/25/18 1244  BP: 111/69  Pulse: 68  Weight: 224 lb (101.6 kg)  Height: 4\' 10"  (1.473 m)   Body mass index is 46.82 kg/m. Generalized: Well developed, obese female in no acute distress with downs features  Head: normocephalic and atraumatic,.    Neck: Supple,  Neurological examination  Mentation: Alert . Follows all commands speech and language fluent but with childlike quality. Diminished attention, registration and recall. Cranial nerve II-XII: Pupils were equal round reactive to light extraocular movements with end gaze horizontal nystagmus , visual field were full on confrontational test. Facial sensation and strength were normal. hearing was intact to finger rubbing bilaterally. Uvula tongue midline. head turning and shoulder shrug and were normal and symmetric.Tongue protrusion into cheek strength was normal.  Motor: normal bulk and tone, full strength in the BUE, BLE, fine finger movements normal, no pronator drift. No focal weakness  Sensory: normal and symmetric to light touch, pinprick, and vibration in the upper and lower extremities Coordination: finger-nose-finger, heel-to-shin normal on the right unable on the left due to knee pain  Reflexes: 1+ upper lower and symmetric, plantar responses were flexor bilaterally.  Gait and Station: Rising up from seated position without assistance, wide base stance, moderate stride, good arm swing, smooth turning,  Unsteady with tandem  DIAGNOSTIC DATA (LABS, IMAGING, TESTING) - I reviewed patient records, labs, notes, testing and imaging myself where available.  Lab Results  Component Value Date   WBC 5.8 08/01/2017   HGB 14.0 08/01/2017   HCT 41.8 08/01/2017   MCV 103.7 (H) 08/01/2017    PLT 224.0 08/01/2017      Component Value Date/Time   NA 138 08/01/2017 1234   K 4.3 08/01/2017 1234   CL 102 08/01/2017 1234   CO2 31 08/01/2017 1234   GLUCOSE 96 08/01/2017 1234   BUN 20 08/01/2017 1234   CREATININE 0.92 08/01/2017 1234   CALCIUM 9.4 08/01/2017 1234   PROT 7.1 08/29/2016 0920   ALBUMIN 3.7 08/29/2016 0920   AST 26 08/29/2016 0920   ALT 35 08/29/2016 0920   ALKPHOS 46 08/29/2016 0920   BILITOT 0.3 08/29/2016 0920   GFRNONAA >60 09/27/2014 0700   GFRAA >60 09/27/2014 0700    Lab Results  Component Value Date   HGBA1C 5.9 08/01/2017   Lab Results  Component Value Date   VITAMINB12 482 08/29/2016   Lab Results  Component Value Date   TSH 1.14 08/01/2017      ASSESSMENT AND PLAN 47 y.o. year old female  has a past medical history of Downs syndrome; Depression;  Seizure; OSA on CPAP (05/2012);  and Presenile dementia, uncomplicated. here to followup. She has not had seizure activity in the last  few years,  The patient is a current patient of Dr. [name] who is out of the office today . This note is sent to the work in doctor.     Continue Namenda at current dose will refill  Continue Keppra at current dose will refill Reviewed recent labs from 08/01/17 CBC and BMP.  Continue with CPAP for obstructive sleep apnea Dr. Craige Cotta Call for any seizure activity Follow-up yearly and when necessary Nilda Riggs, [facility] APRN  [facility] [address] [place], [place] [address] [phone]

## 2018-02-25 ENCOUNTER — Encounter: Payer: Self-pay | Admitting: Nurse Practitioner

## 2018-02-25 ENCOUNTER — Ambulatory Visit: Payer: Medicare Other | Admitting: Nurse Practitioner

## 2018-02-25 VITALS — BP 111/69 | HR 68 | Ht <= 58 in | Wt 224.0 lb

## 2018-02-25 DIAGNOSIS — R569 Unspecified convulsions: Secondary | ICD-10-CM | POA: Diagnosis not present

## 2018-02-25 DIAGNOSIS — Q909 Down syndrome, unspecified: Secondary | ICD-10-CM | POA: Diagnosis not present

## 2018-02-25 DIAGNOSIS — F0390 Unspecified dementia without behavioral disturbance: Secondary | ICD-10-CM

## 2018-02-25 MED ORDER — MEMANTINE HCL 10 MG PO TABS: 10.0000 mg | ORAL_TABLET | Freq: Two times a day (BID) | ORAL | 3 refills | Status: DC

## 2018-02-25 MED ORDER — LEVETIRACETAM 500 MG PO TABS: ORAL_TABLET | ORAL | 3 refills | Status: DC

## 2018-02-25 NOTE — Patient Instructions (Addendum)
Continue Namenda at current dose will refill  Continue Keppra at current dose will refill Reviewed recent labs from 08/01/17 CBC and BMP.  Continue with CPAP for obstructive sleep apnea Dr. Craige CottaSood Call for any seizure activity Follow-up yearly and when necessary

## 2018-03-14 NOTE — Progress Notes (Signed)
I reviewed note and agree with plan.   VIKRAM R. PENUMALLI, MD 

## 2018-04-12 ENCOUNTER — Ambulatory Visit: Payer: Medicare Other | Admitting: Podiatry

## 2018-04-19 ENCOUNTER — Other Ambulatory Visit: Payer: Self-pay | Admitting: Family Medicine

## 2018-04-20 ENCOUNTER — Other Ambulatory Visit: Payer: Self-pay | Admitting: Family Medicine

## 2018-05-15 ENCOUNTER — Other Ambulatory Visit: Payer: Self-pay | Admitting: Family Medicine

## 2018-05-15 DIAGNOSIS — R059 Cough, unspecified: Secondary | ICD-10-CM

## 2018-05-15 DIAGNOSIS — R05 Cough: Secondary | ICD-10-CM

## 2018-05-15 NOTE — Telephone Encounter (Signed)
Patient's mom called and said she wants to have the medication on hand, so she won't have to take patient to the hospital.

## 2018-05-23 ENCOUNTER — Ambulatory Visit: Payer: Medicare Other | Admitting: Podiatry

## 2018-07-05 ENCOUNTER — Ambulatory Visit: Payer: Medicare Other | Admitting: Podiatry

## 2018-07-31 ENCOUNTER — Ambulatory Visit (INDEPENDENT_AMBULATORY_CARE_PROVIDER_SITE_OTHER): Payer: Medicare Other

## 2018-07-31 DIAGNOSIS — Z0000 Encounter for general adult medical examination without abnormal findings: Secondary | ICD-10-CM

## 2018-07-31 NOTE — Progress Notes (Signed)
PCP notes:   Health maintenance:  Mammogram - addressed  Abnormal screenings:   None  Patient concerns:   None  Nurse concerns:  None  Next PCP appt:   08/08/18 @ 1030

## 2018-07-31 NOTE — Progress Notes (Signed)
Subjective:   [name] is a 48 y.o. female who presents for Medicare Annual (Subsequent) preventive examination.  Review of Systems:  N/A Cardiac Risk Factors include: obesity (BMI >30kg/m2)     Objective:     Vitals: There were no vitals taken for this visit.  There is no height or weight on file to calculate BMI.  Advanced Directives 07/31/2018 08/08/2017 09/25/2014 09/25/2014 01/24/2014 09/24/2011  Does Patient Have a Medical Advance Directive? No No No No No Patient does not have advance directive  Would patient like information on creating a medical advance directive? No - Patient declined No - Patient declined No - patient declined information - - -  Pre-existing out of facility DNR order (yellow form or pink MOST form) - - - - - No    Tobacco Social History   Tobacco Use  Smoking Status Never Smoker  Smokeless Tobacco Never Used     Counseling given: No   Clinical Intake:  Pre-visit preparation completed: Yes  Pain : 0-10 Pain Score: 8  Pain Type: Chronic pain Pain Location: Knee Pain Orientation: Left     Nutritional Status: BMI > 30  Obese Nutritional Risks: None  How often do you need to have someone help you when you read instructions, pamphlets, or other written materials from your doctor or pharmacy?: 1 - Never What is the last grade level you completed in school?: stopped at age 48  Interpreter Needed?: No  Information entered by :: LPinson, LPN  Past Medical History:  Diagnosis Date  . Dementia due to another medical condition [facility](HCC)    Trisomy 21-related, sees Sethi yearly, nl CT head 2010, did not tolerate aricept  . Depression    with OCD  . Diabetes mellitus (HCC)    "just when she was on TPN"  . DJD (degenerative joint disease) of knee    bilat knee arthritis, had 3/3 Supartz injections [name]([facility])  . Down syndrome   . 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 Eczema   . Empyema of left pleural space (HCC) 09/24/2011  . Hypothyroid   . OCD (obsessive  compulsive disorder)   . On home oxygen therapy    "1L just at night" (09/25/2014)  . OSA on CPAP 05/2012   severe OSA with AHI 118, desat nadir to 67%  . Pneumonia    multiple pneumonias  . Presenile dementia, uncomplicated (HCC)   . Seizure (HCC) 2013   x 2 ; "very mild"; during hospitalization "while coming off vent"  . Sleep-related hypoventilation 09/23/2012   Past Surgical History:  Procedure Laterality Date  . ACHILLES TENDON REPAIR Right 01/2017   Dr. Nena PolioEvans/[facility]  . ANTERIOR CRUCIATE LIGAMENT REPAIR Left   . BACK SURGERY    . FLEXIBLE BRONCHOSCOPY  09/24/2011   Procedure: FLEXIBLE BRONCHOSCOPY;  Surgeon: Purcell Nailslarence H Owen, MD;  [facility];  Service: Thoracic;  Laterality: N/A;  . LUMBAR DISC SURGERY     ruptured disc  . TRACHEOSTOMY TUBE PLACEMENT  10/11/2011   Procedure: TRACHEOSTOMY;  Surgeon: Serena ColonelJefry Rosen, MD;  [facility];  Service: ENT;  Laterality: N/A;  . VIDEO ASSISTED THORACOSCOPY (VATS) /THOROCOTOMY/RADIOACTIVE SEED IMPLANT Left 09/24/2011   VATS, (Dr. Barry Dieneswens)   Family History  Problem Relation Age of Onset  . Arthritis Mother   . Hypertension Father   . Diabetes Father   . Cancer Neg Hx   . Coronary artery disease Neg Hx   . Stroke Neg Hx    Social History  Socioeconomic History  . Marital status: Single    Spouse name: Not on file  . Number of children: 0  . Years of education: Not on file  . Highest education level: Not on file  Occupational History  . Not on file  Social Needs  . Financial resource strain: Not on file  . Food insecurity:    Worry: Not on file    Inability: Not on file  . Transportation needs:    Medical: Not on file    Non-medical: Not on file  Tobacco Use  . Smoking status: Never Smoker  . Smokeless tobacco: Never Used  Substance and Sexual Activity  . Alcohol use: No  . Drug use: No  . Sexual activity: Never  Lifestyle  . Physical activity:    Days per week: Not on file    Minutes per session: Not on  file  . Stress: Not on file  Relationships  . Social connections:    Talks on phone: Not on file    Gets together: Not on file    Attends religious service: Not on file    Active member of club or organization: Not on file    Attends meetings of clubs or organizations: Not on file    Relationship status: Not on file  Other Topics Concern  . Not on file  Social History Narrative   Lives with mom and dad, dogs   Stays at daycare during the day   Activity: walking some   Diet: good water, fruits/vegetables daily    Outpatient Encounter Medications as of 07/31/2018  Medication Sig  . albuterol (PROVENTIL) (2.5 MG/3ML) 0.083% nebulizer solution USE 1 VIAL VIA NEBULIZER EVERY 6 HOURS AS NEEDED FOR WHEEZING OR SHORTNESS OF BREATH  . allopurinol (ZYLOPRIM) 100 MG tablet TAKE 1 TABLET BY MOUTH EVERY DAY  . Cholecalciferol (VITAMIN D3 PO) Take 500 Units by mouth daily.  . clobetasol cream (TEMOVATE) 0.05 % APPLY TO AFFECTED AREA TWICE A DAY. 2 WEEKS AT A TIME  . colchicine 0.6 MG tablet TAKE 1 TABLET BY MOUTH EVERY DAY AS NEEDED FOR GOUT FLARE  . ketoconazole (NIZORAL) 2 % shampoo   . levETIRAcetam (KEPPRA) 500 MG tablet TAKE 1 TABLET EVERY 12 HOURS  . levothyroxine (SYNTHROID, LEVOTHROID) 125 MCG tablet TAKE 1 TABLET BY MOUTH EVERY DAY BEFORE BREAKFAST  . memantine (NAMENDA) 10 MG tablet Take 1 tablet (10 mg total) by mouth 2 (two) times daily.  . Multiple Vitamins-Minerals (WOMENS MULTI PO) Take 1 tablet by mouth daily.   . Naproxen (NAPROSYN PO) Take by mouth as needed.   No facility-administered encounter medications on file as of 07/31/2018.     Activities of Daily Living In your present state of health, do you have any difficulty performing the following activities: 07/31/2018 08/08/2017  Hearing? N N  Vision? N N  Difficulty concentrating or making decisions? Malvin Johns  Walking or climbing stairs? N N  Dressing or bathing? Y N  Doing errands, shopping? Malvin Johns  Preparing Food and eating ? Y Y   Using the Toilet? N N  In the past six months, have you accidently leaked urine? N N  Do you have problems with loss of bowel control? N N  Managing your Medications? Y Y  Managing your Finances? Malvin Johns  Housekeeping or managing your Housekeeping? N Y  Some recent data might be hidden    Patient Care Team: Eustaquio Boyden, MD as PCP - General (Family Medicine) Arville Go, MD  as Referring Physician (Internal Medicine)    Assessment:   This is a routine wellness examination for [name].  Vision Screening Comments: Vision exam every 6 mths with Dr. Jenkins Rouge  Exercise Activities and Dietary recommendations Current Exercise Habits: Home exercise routine, Type of exercise: walking, Time (Minutes): 30, Frequency (Times/Week): 7, Weekly Exercise (Minutes/Week): 210, Intensity: Mild, Exercise limited by: orthopedic condition(s)  Goals    . Patient Stated     Starting 08/08/2017, I will continue to take medications as prescribed.        Fall Risk Fall Risk  07/31/2018 08/08/2017 09/08/2016 09/08/2016 09/27/2015  Falls in the past year? 0 No No Yes No   Depression Screen PHQ 2/9 Scores 08/08/2017 09/08/2016 09/27/2015 07/13/2014  PHQ - 2 Score 0 0 0 0  PHQ- 9 Score 0 - - -     Cognitive Function - DX of down syndrome and presenile dementia MMSE - Mini Mental State Exam 07/31/2018 08/08/2017  Not completed: (No Data) (No Data)        Immunization History  Administered Date(s) Administered  . Influenza Split 12/05/2011  . Influenza, Seasonal, Injecte, Preservative Fre 02/13/2013  . Influenza,inj,Quad PF,6+ Mos 12/04/2013, 11/19/2014, 11/17/2015, 12/14/2016, 02/02/2018  . Pneumococcal Polysaccharide-23 12/05/2011  . Tdap 02/13/2013    Screening Tests Health Maintenance  Topic Date Due  . HIV Screening  08/09/2018 (Originally 09/11/1985)  . MAMMOGRAM  02/27/2019 (Originally 01/11/2018)  . PAP SMEAR-Modifier  08/08/2048 (Originally 09/12/1991)  . INFLUENZA VACCINE  09/28/2018  .  DTaP/Tdap/Td (2 - Td) 02/14/2023  . TETANUS/TDAP  02/14/2023      Plan:     I have personally reviewed, addressed, and noted the following in the patient's chart:  A. Medical and social history B. Use of alcohol, tobacco or illicit drugs  C. Current medications and supplements D. Functional ability and status E.  Nutritional status F.  Physical activity G. Advance directives H. List of other physicians I.  Hospitalizations, surgeries, and ER visits in previous 12 months J.  Vitals (unless it is a telemedicine encounter) K. Screenings to include cognitive, depression, hearing, vision (NOTE: hearing and vision screenings not completed in telemedicine encounter) L. Referrals and appointments   In addition, I have reviewed and discussed with patient certain preventive protocols, quality metrics, and best practice recommendations. A written personalized care plan for preventive services and recommendations were provided to patient.  With patient's permission, we connected on 07/31/18 at 10:00 AM EDT. Interactive audio and video telecommunications were attempted with patient. This attempt was unsuccessful due to patient having technical difficulties OR patient did not have access to video capability.  Encounter was completed with audio only.  Two patient identifiers were used to ensure the encounter occurred with the correct person. Patient was in home and writer was in office.   Signed,   Randa Evens, MHA, BS, LPN Health Coach

## 2018-07-31 NOTE — Patient Instructions (Signed)
[name] , Thank you for taking time to come for your Medicare Wellness Visit. I appreciate your ongoing commitment to your health goals. Please review the following plan we discussed and let me know if I can assist you in the future.   These are the goals we discussed: Goals    . Patient Stated     Starting 08/08/2017, I will continue to take medications as prescribed.        This is a list of the screening recommended for you and due dates:  Health Maintenance  Topic Date Due  . HIV Screening  08/09/2018*  . Mammogram  02/27/2019*  . Pap Smear  08/08/2048*  . Flu Shot  09/28/2018  . DTaP/Tdap/Td vaccine (2 - Td) 02/14/2023  . Tetanus Vaccine  02/14/2023  *Topic was postponed. The date shown is not the original due date.   Preventive Care for Adults  A healthy lifestyle and preventive care can promote health and wellness. Preventive health guidelines for adults include the following key practices.  . A routine yearly physical is a good way to check with your health care provider about your health and preventive screening. It is a chance to share any concerns and updates on your health and to receive a thorough exam.  . Visit your dentist for a routine exam and preventive care every 6 months. Brush your teeth twice a day and floss once a day. Good oral hygiene prevents tooth decay and gum disease.  . The frequency of eye exams is based on your age, health, family medical history, use  of contact lenses, and other factors. Follow your health care provider's recommendations for frequency of eye exams.  . Eat a healthy diet. Foods like vegetables, fruits, whole grains, low-fat dairy products, and lean protein foods contain the nutrients you need without too many calories. Decrease your intake of foods high in solid fats, added sugars, and salt. Eat the right amount of calories for you. Get information about a proper diet from your health care provider, if necessary.  . Regular  physical exercise is one of the most important things you can do for your health. Most adults should get at least 150 minutes of moderate-intensity exercise (any activity that increases your heart rate and causes you to sweat) each week. In addition, most adults need muscle-strengthening exercises on 2 or more days a week.  Silver Sneakers may be a benefit available to you. To determine eligibility, you may visit the website: www.silversneakers.com or contact program at 8177783859 Mon-Fri between 8AM-8PM.   . Maintain a healthy weight. The body mass index (BMI) is a screening tool to identify possible weight problems. It provides an estimate of body fat based on height and weight. Your health care provider can find your BMI and can help you achieve or maintain a healthy weight.   For adults 20 years and older: ? A BMI below 18.5 is considered underweight. ? A BMI of 18.5 to 24.9 is normal. ? A BMI of 25 to 29.9 is considered overweight. ? A BMI of 30 and above is considered obese.   . Maintain normal blood lipids and cholesterol levels by exercising and minimizing your intake of saturated fat. Eat a balanced diet with plenty of fruit and vegetables. Blood tests for lipids and cholesterol should begin at age 38 and be repeated every 5 years. If your lipid or cholesterol levels are high, you are over 50, or you are at high risk for heart disease, you  may need your cholesterol levels checked more frequently. Ongoing high lipid and cholesterol levels should be treated with medicines if diet and exercise are not working.  . If you smoke, find out from your health care provider how to quit. If you do not use tobacco, please do not start.  . If you choose to drink alcohol, please do not consume more than 2 drinks per day. One drink is considered to be 12 ounces (355 mL) of beer, 5 ounces (148 mL) of wine, or 1.5 ounces (44 mL) of liquor.  . If you are 43-79 years old, ask your health care provider if  you should take aspirin to prevent strokes.  . Use sunscreen. Apply sunscreen liberally and repeatedly throughout the day. You should seek shade when your shadow is shorter than you. Protect yourself by wearing long sleeves, pants, a wide-brimmed hat, and sunglasses year round, whenever you are outdoors.  . Once a month, do a whole body skin exam, using a mirror to look at the skin on your back. Tell your health care provider of new moles, moles that have irregular borders, moles that are larger than a pencil eraser, or moles that have changed in shape or color.

## 2018-08-01 NOTE — Progress Notes (Signed)
I reviewed health advisor's note, was available for consultation, and agree with documentation and plan.  

## 2018-08-06 ENCOUNTER — Other Ambulatory Visit (INDEPENDENT_AMBULATORY_CARE_PROVIDER_SITE_OTHER): Payer: Medicare Other

## 2018-08-06 ENCOUNTER — Other Ambulatory Visit: Payer: Self-pay | Admitting: Family Medicine

## 2018-08-06 ENCOUNTER — Telehealth: Payer: Self-pay

## 2018-08-06 DIAGNOSIS — E039 Hypothyroidism, unspecified: Secondary | ICD-10-CM

## 2018-08-06 DIAGNOSIS — Z13220 Encounter for screening for lipoid disorders: Secondary | ICD-10-CM

## 2018-08-06 DIAGNOSIS — M1A0690 Idiopathic chronic gout, unspecified knee, without tophus (tophi): Secondary | ICD-10-CM | POA: Diagnosis not present

## 2018-08-06 DIAGNOSIS — R7303 Prediabetes: Secondary | ICD-10-CM

## 2018-08-06 LAB — TSH: TSH: 1.48 u[IU]/mL (ref 0.35–4.50)

## 2018-08-06 LAB — HEMOGLOBIN A1C: Hgb A1c MFr Bld: 5.6 % (ref 4.6–6.5)

## 2018-08-06 NOTE — Telephone Encounter (Signed)
LVM asking pt to call [facility] Pastor[facility].  Gave lab number.  Need to redraw pt due to a spill at the lab.

## 2018-08-08 ENCOUNTER — Encounter: Payer: Self-pay | Admitting: Family Medicine

## 2018-08-08 ENCOUNTER — Other Ambulatory Visit (INDEPENDENT_AMBULATORY_CARE_PROVIDER_SITE_OTHER): Payer: Medicare Other

## 2018-08-08 ENCOUNTER — Ambulatory Visit (INDEPENDENT_AMBULATORY_CARE_PROVIDER_SITE_OTHER): Payer: Medicare Other | Admitting: Family Medicine

## 2018-08-08 VITALS — BP 100/74 | HR 72 | Temp 96.9°F | Ht <= 58 in | Wt 242.0 lb

## 2018-08-08 DIAGNOSIS — M1A0620 Idiopathic chronic gout, left knee, without tophus (tophi): Secondary | ICD-10-CM

## 2018-08-08 DIAGNOSIS — M1A0690 Idiopathic chronic gout, unspecified knee, without tophus (tophi): Secondary | ICD-10-CM

## 2018-08-08 DIAGNOSIS — F0390 Unspecified dementia without behavioral disturbance: Secondary | ICD-10-CM

## 2018-08-08 DIAGNOSIS — G4733 Obstructive sleep apnea (adult) (pediatric): Secondary | ICD-10-CM

## 2018-08-08 DIAGNOSIS — R7303 Prediabetes: Secondary | ICD-10-CM | POA: Diagnosis not present

## 2018-08-08 DIAGNOSIS — Q909 Down syndrome, unspecified: Secondary | ICD-10-CM | POA: Diagnosis not present

## 2018-08-08 DIAGNOSIS — Z0000 Encounter for general adult medical examination without abnormal findings: Secondary | ICD-10-CM | POA: Diagnosis not present

## 2018-08-08 DIAGNOSIS — Z6841 Body Mass Index (BMI) 40.0 and over, adult: Secondary | ICD-10-CM

## 2018-08-08 DIAGNOSIS — E039 Hypothyroidism, unspecified: Secondary | ICD-10-CM

## 2018-08-08 DIAGNOSIS — Z9989 Dependence on other enabling machines and devices: Secondary | ICD-10-CM

## 2018-08-08 DIAGNOSIS — Z7189 Other specified counseling: Secondary | ICD-10-CM | POA: Diagnosis not present

## 2018-08-08 DIAGNOSIS — M1712 Unilateral primary osteoarthritis, left knee: Secondary | ICD-10-CM

## 2018-08-08 DIAGNOSIS — D7589 Other specified diseases of blood and blood-forming organs: Secondary | ICD-10-CM

## 2018-08-08 DIAGNOSIS — F331 Major depressive disorder, recurrent, moderate: Secondary | ICD-10-CM

## 2018-08-08 DIAGNOSIS — G40209 Localization-related (focal) (partial) symptomatic epilepsy and epileptic syndromes with complex partial seizures, not intractable, without status epilepticus: Secondary | ICD-10-CM

## 2018-08-08 LAB — COMPREHENSIVE METABOLIC PANEL
ALT: 24 U/L (ref 0–35)
AST: 20 U/L (ref 0–37)
Albumin: 3.4 g/dL — ABNORMAL LOW (ref 3.5–5.2)
Alkaline Phosphatase: 47 U/L (ref 39–117)
BUN: 15 mg/dL (ref 6–23)
CO2: 31 mEq/L (ref 19–32)
Calcium: 8.8 mg/dL (ref 8.4–10.5)
Chloride: 103 mEq/L (ref 96–112)
Creatinine, Ser: 1.11 mg/dL (ref 0.40–1.20)
GFR: 52.48 mL/min — ABNORMAL LOW (ref >60.00)
Glucose, Bld: 112 mg/dL — ABNORMAL HIGH (ref 70–99)
Potassium: 4.5 mEq/L (ref 3.5–5.1)
Sodium: 138 mEq/L (ref 135–145)
Total Bilirubin: 0.4 mg/dL (ref 0.2–1.2)
Total Protein: 6.5 g/dL (ref 6.0–8.3)

## 2018-08-08 LAB — LIPASE: Lipase: 18 U/L (ref 11.0–59.0)

## 2018-08-08 LAB — CBC WITH DIFFERENTIAL/PLATELET
Basophils Absolute: 0.1 10*3/uL (ref 0.0–0.1)
Basophils Relative: 2.3 % (ref 0.0–3.0)
Eosinophils Absolute: 0.1 10*3/uL (ref 0.0–0.7)
Eosinophils Relative: 2.7 % (ref 0.0–5.0)
HCT: 36.9 % (ref 36.0–46.0)
Hemoglobin: 12.4 g/dL (ref 12.0–15.0)
Lymphocytes Relative: 23.7 % (ref 12.0–46.0)
Lymphs Abs: 1 10*3/uL (ref 0.7–4.0)
MCHC: 33.5 g/dL (ref 30.0–36.0)
MCV: 105.8 fl — ABNORMAL HIGH (ref 78.0–100.0)
Monocytes Absolute: 0.4 10*3/uL (ref 0.1–1.0)
Monocytes Relative: 9 % (ref 3.0–12.0)
Neutro Abs: 2.6 10*3/uL (ref 1.4–7.7)
Neutrophils Relative %: 62.3 % (ref 43.0–77.0)
Platelets: 200 10*3/uL (ref 150.0–400.0)
RBC: 3.49 Mil/uL — ABNORMAL LOW (ref 3.87–5.11)
RDW: 14.8 % (ref 11.5–15.5)
WBC: 4.3 10*3/uL (ref 4.0–10.5)

## 2018-08-08 LAB — URIC ACID: Uric Acid, Serum: 5.7 mg/dL (ref 2.4–7.0)

## 2018-08-08 NOTE — Assessment & Plan Note (Signed)
Stable period continues namenda.

## 2018-08-08 NOTE — Assessment & Plan Note (Signed)
Weight gain noted. Knee pain limits activity.

## 2018-08-08 NOTE — Assessment & Plan Note (Signed)
Pending uric acid. If elevated, low threshold to increase allopurinol dose.

## 2018-08-08 NOTE — Assessment & Plan Note (Signed)
Preventative protocols reviewed and updated unless pt declined. Discussed healthy diet and lifestyle.  

## 2018-08-08 NOTE — Assessment & Plan Note (Addendum)
Mom notes knee pain doesn't improve with colchicine - suggesting knee pains are more osteoarthritis flare.

## 2018-08-08 NOTE — Assessment & Plan Note (Signed)
Stable period. Staying at home now.

## 2018-08-08 NOTE — Assessment & Plan Note (Signed)
Stable period off meds.  

## 2018-08-08 NOTE — Assessment & Plan Note (Signed)
A1c actually normal despite weight gain.

## 2018-08-08 NOTE — Assessment & Plan Note (Signed)
Advanced directives: mom would be HCPOA but this has not been set up yet - mom working on this. On waiting list to meet with bank to work on family living will.

## 2018-08-08 NOTE — Assessment & Plan Note (Signed)
Chronic, stable. Continue current levothyroxine dose.  

## 2018-08-08 NOTE — Progress Notes (Signed)
Virtual visit completed through [employer]. Due to national recommendations of social distancing due to COVID-19, a virtual visit is felt to be most appropriate for this patient at this time. Reviewed limitations of a virtual visit.   Patient location: home Provider location: [place] [facility] office If any vitals were documented, they were collected by patient at home unless specified below.    BP 100/74   Pulse 72   Temp (!) 96.9 F (36.1 C) (Axillary)   Ht 4\' 9"  (1.448 m)   Wt 242 lb (109.8 kg)   LMP 07/29/2018   BMI 52.37 kg/m    CC: CPE Subjective:    Patient ID: [name], female    DOB: Apr 09, 1970, 48 y.o.   MRN: [id number]  HPI: Christine Galasammy M Mclennan is a 48 y.o. female presenting on 08/08/2018 for Annual Exam (Pt 2. )   Saw Virl AxeLesia last week for medicare wellness visit. Note reviewed.    Ongoing L knee pain. Sees ortho, managing with meloxicam prn also with h/o gout, uses colchicine PRN. Taking allopurinol 100mg  daily.   Preventative: Regular periods- LMP last week - has had 2 this month. Becoming more sporadic.  Well woman - discussed with mom.Doesn't want pap smear or pelvic exam or breast exam.  Mammogram WNL 12/2016. Due for repeat.  Flu shot - yearly  Pneumovax 2013.  Tdap - 01/2013.  Advanced directives: mom would be HCPOA but this has not been set up yet - mom working on this. On waiting list to meet with bank to work on family living will.  Seat belt use discussed. Sunscreen use discussed. No changing moles.  Non smoker  Alcohol - none Dentist - [id number] Eye exam yearly Mother hasn't noticed any trouble with memory.  Bowel - no constipation Bladder - no incontinence  Lives with mom and dad, dogs Stays at home during the day, no longer goes to adult daycare Activity: walking some Diet: some water, likes coke zero, fruits/vegetables daily - encouraged limited sugar/carbs     Relevant past medical, surgical, family and social history reviewed and  updated as indicated. Interim medical history since our last visit reviewed. Allergies and medications reviewed and updated. Outpatient Medications Prior to Visit  Medication Sig Dispense Refill  . albuterol (PROVENTIL) (2.5 MG/3ML) 0.083% nebulizer solution USE 1 VIAL VIA NEBULIZER EVERY 6 HOURS AS NEEDED FOR WHEEZING OR SHORTNESS OF BREATH 75 mL 3  . allopurinol (ZYLOPRIM) 100 MG tablet TAKE 1 TABLET BY MOUTH EVERY DAY 90 tablet 1  . Cholecalciferol (VITAMIN D3 PO) Take 500 Units by mouth daily.    . clobetasol cream (TEMOVATE) 0.05 % APPLY TO AFFECTED AREA TWICE A DAY. 2 WEEKS AT A TIME 45 g 1  . colchicine 0.6 MG tablet TAKE 1 TABLET BY MOUTH EVERY DAY AS NEEDED FOR GOUT FLARE 30 tablet 5  . ketoconazole (NIZORAL) 2 % shampoo     . levETIRAcetam (KEPPRA) 500 MG tablet TAKE 1 TABLET EVERY 12 HOURS 180 tablet 3  . levothyroxine (SYNTHROID, LEVOTHROID) 125 MCG tablet TAKE 1 TABLET BY MOUTH EVERY DAY BEFORE BREAKFAST 90 tablet 1  . memantine (NAMENDA) 10 MG tablet Take 1 tablet (10 mg total) by mouth 2 (two) times daily. 180 tablet 3  . Multiple Vitamins-Minerals (WOMENS MULTI PO) Take 1 tablet by mouth daily.     . Naproxen (NAPROSYN PO) Take by mouth as needed.     No facility-administered medications prior to visit.      Per HPI  unless specifically indicated in ROS section below Review of Systems  Constitutional: Negative for activity change, appetite change, chills, fatigue, fever and unexpected weight change.  HENT: Negative for hearing loss.   Eyes: Negative for visual disturbance.  Respiratory: Positive for cough (allergy related). Negative for chest tightness, shortness of breath and wheezing.   Cardiovascular: Negative for chest pain, palpitations and leg swelling.  Gastrointestinal: Positive for abdominal pain (some discomfort below umbilicus). Negative for abdominal distention, blood in stool, constipation, diarrhea, nausea and vomiting.  Genitourinary: Negative for difficulty  urinating and hematuria.  Musculoskeletal: Positive for arthralgias (L knee pain). Negative for myalgias and neck pain.  Skin: Negative for rash.  Neurological: Negative for dizziness, seizures, syncope and headaches.  Hematological: Negative for adenopathy. Does not bruise/bleed easily.  Psychiatric/Behavioral: Negative for dysphoric mood. The patient is not nervous/anxious.    Objective:    BP 100/74   Pulse 72   Temp (!) 96.9 F (36.1 C) (Axillary)   Ht 4\' 9"  (1.448 m)   Wt 242 lb (109.8 kg)   LMP 07/29/2018   BMI 52.37 kg/m   Wt Readings from Last 3 Encounters:  08/08/18 242 lb (109.8 kg)  02/25/18 224 lb (101.6 kg)  11/26/17 227 lb 3.2 oz (103.1 kg)     Physical exam: Gen: alert, NAD, not ill appearing Pulm: speaks in complete sentences without increased work of breathing Psych: normal mood, normal thought content      Results for orders placed or performed in visit on 08/06/18  TSH  Result Value Ref Range   TSH 1.48 0.35 - 4.50 uIU/mL  Hemoglobin A1c  Result Value Ref Range   Hgb A1c MFr Bld 5.6 4.6 - 6.5 %   Assessment & Plan:   Problem List Items Addressed This Visit    Presenile dementia, 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       Prediabetes    A1c actually normal despite weight gain.       Partial epilepsy with impairment of consciousness ([place])    Continues keppra.       Osteoarthritis of left knee    Mom notes knee pain doesn't improve with colchicine - suggesting knee pains are more osteoarthritis flare.       OSA on CPAP    Continues CPAP.       Morbid obesity with BMI of 50.0-59.9, adult ([place])    Weight gain noted. Knee pain limits activity.       MDD (major depressive disorder), recurrent episode, moderate ([facility])    Stable period off meds.       Hypothyroid    Chronic, stable. Continue current levothyroxine dose.       Health maintenance examination - Primary    Preventative protocols reviewed and updated  unless pt declined. Discussed healthy diet and lifestyle.       Down syndrome    Stable period. Staying at home now.       Chronic gout    Pending uric acid. If elevated, low threshold to increase allopurinol dose.       Advanced care planning/counseling discussion    Advanced directives: mom would be HCPOA but this has not been set up yet - mom working on this. On waiting list to meet with bank to work on family living will.           No orders of the defined types were placed in this encounter.  No orders of the defined types were  placed in this encounter.   I discussed the assessment and treatment plan with the patient. The patient was provided an opportunity to ask questions and all were answered. The patient agreed with the plan and demonstrated an understanding of the instructions. The patient was advised to call back or seek an in-person evaluation if the symptoms worsen or if the condition fails to improve as anticipated.  Follow up plan: No follow-ups on file.  Eustaquio BoydenJavier Molina Hollenback, MD

## 2018-08-08 NOTE — Assessment & Plan Note (Signed)
-   Continues CPAP  

## 2018-08-08 NOTE — Assessment & Plan Note (Signed)
Continues keppra.

## 2018-08-09 NOTE — Addendum Note (Signed)
Addended by: Ellamae Sia on: 08/09/2018 04:06 PM   Modules accepted: Orders

## 2018-08-12 LAB — PATHOLOGIST SMEAR REVIEW

## 2018-08-14 ENCOUNTER — Encounter

## 2018-08-14 ENCOUNTER — Other Ambulatory Visit: Payer: Self-pay

## 2018-08-14 ENCOUNTER — Encounter: Payer: Self-pay | Admitting: Podiatry

## 2018-08-14 ENCOUNTER — Ambulatory Visit (INDEPENDENT_AMBULATORY_CARE_PROVIDER_SITE_OTHER): Payer: Medicare Other | Admitting: Podiatry

## 2018-08-14 DIAGNOSIS — M79676 Pain in unspecified toe(s): Secondary | ICD-10-CM

## 2018-08-14 DIAGNOSIS — B351 Tinea unguium: Secondary | ICD-10-CM

## 2018-08-14 NOTE — Patient Instructions (Signed)

## 2018-08-14 NOTE — Progress Notes (Signed)
Subjective:  [name] presents to clinic today with cc of  painful, thick, discolored, elongated toenails 1-5 b/l that become tender and cannot cut because of thickness.  Pain is aggravated when wearing enclosed shoe gear. She voices no new pedal concerns on today's visit.  Ria Bush, MD is her PCP and last visit was 08/08/2018.  Current Outpatient Medications:  .  albuterol (PROVENTIL) (2.5 MG/3ML) 0.083% nebulizer solution, USE 1 VIAL VIA NEBULIZER EVERY 6 HOURS AS NEEDED FOR WHEEZING OR SHORTNESS OF BREATH, Disp: 75 mL, Rfl: 3 .  allopurinol (ZYLOPRIM) 100 MG tablet, TAKE 1 TABLET BY MOUTH EVERY DAY, Disp: 90 tablet, Rfl: 1 .  Cholecalciferol (VITAMIN D3 PO), Take 500 Units by mouth daily., Disp: , Rfl:  .  clobetasol cream (TEMOVATE) 0.05 %, APPLY TO AFFECTED AREA TWICE A DAY. 2 WEEKS AT A TIME, Disp: 45 g, Rfl: 1 .  colchicine 0.6 MG tablet, TAKE 1 TABLET BY MOUTH EVERY DAY AS NEEDED FOR GOUT FLARE, Disp: 30 tablet, Rfl: 5 .  ketoconazole (NIZORAL) 2 % shampoo, , Disp: , Rfl:  .  levETIRAcetam (KEPPRA) 500 MG tablet, TAKE 1 TABLET EVERY 12 HOURS, Disp: 180 tablet, Rfl: 3 .  levothyroxine (SYNTHROID, LEVOTHROID) 125 MCG tablet, TAKE 1 TABLET BY MOUTH EVERY DAY BEFORE BREAKFAST, Disp: 90 tablet, Rfl: 1 .  meloxicam (MOBIC) 15 MG tablet, TAKE 1 TABLET BY MOUTH EVERY DAY IN THE MORNING WITH FOOD FOR PAIN, Disp: , Rfl:  .  memantine (NAMENDA) 10 MG tablet, Take 1 tablet (10 mg total) by mouth 2 (two) times daily., Disp: 180 tablet, Rfl: 3 .  Multiple Vitamins-Minerals (WOMENS MULTI PO), Take 1 tablet by mouth daily. , Disp: , Rfl:  .  Naproxen (NAPROSYN PO), Take by mouth as needed., Disp: , Rfl:  .  triamcinolone ointment (KENALOG) 0.1 %, APPLY TO THE BODY NIGHTLY (NEVER THE FACE OR GROIN), Disp: , Rfl:    No Known Allergies   Objective: There were no vitals filed for this visit.  Physical Examination:  Vascular Examination: Capillary refill time <3 seconds  x 10  digits.  Palpable DP/PT pulses b/l.  Digital hair absent b/l.  No edema noted b/l.  Skin temperature gradient WNL b/l.  Dermatological Examination: Skin with normal turgor, texture and tone b/l.  No open wounds b/l.  No interdigital macerations noted b/l.  Elongated, thick, discolored brittle toenails with subungual debris and pain on dorsal palpation of nailbeds 1-5 b/l.  Musculoskeletal Examination: Muscle strength 5/5 to all muscle groups b/l  No pain, crepitus or joint discomfort with active/passive ROM.  Neurological Examination: Sensation intact 5/5 b/l with 10 gram monofilament.  Vibratory sensation intact b/l.  Assessment: Mycotic nail infection with pain 1-5 b/l  Plan: 1. Toenails 1-5 b/l were debrided in length and girth without iatrogenic laceration. 2.  Continue soft, supportive shoe gear daily. 3.  Report any pedal injuries to medical professional. 4.  Follow up 3 months. 5.  Patient/POA to call should there be a question/concern in there interim.

## 2018-09-02 DIAGNOSIS — M25469 Effusion, unspecified knee: Secondary | ICD-10-CM | POA: Insufficient documentation

## 2018-10-12 ENCOUNTER — Other Ambulatory Visit: Payer: Self-pay | Admitting: Family Medicine

## 2018-10-19 ENCOUNTER — Other Ambulatory Visit: Payer: Self-pay | Admitting: Family Medicine

## 2018-10-28 ENCOUNTER — Other Ambulatory Visit: Payer: Self-pay

## 2018-10-28 ENCOUNTER — Ambulatory Visit (INDEPENDENT_AMBULATORY_CARE_PROVIDER_SITE_OTHER): Payer: Medicare Other | Admitting: Internal Medicine

## 2018-10-28 ENCOUNTER — Encounter: Payer: Self-pay | Admitting: Internal Medicine

## 2018-10-28 VITALS — BP 98/60 | HR 73 | Temp 98.6°F | Ht <= 58 in | Wt 238.0 lb

## 2018-10-28 DIAGNOSIS — R102 Pelvic and perineal pain: Secondary | ICD-10-CM | POA: Insufficient documentation

## 2018-10-28 LAB — POC URINALSYSI DIPSTICK (AUTOMATED)
Bilirubin, UA: NEGATIVE
Glucose, UA: NEGATIVE
Ketones, UA: NEGATIVE
Leukocytes, UA: NEGATIVE
Nitrite, UA: NEGATIVE
Protein, UA: NEGATIVE
Spec Grav, UA: 1.015 (ref 1.010–1.025)
Urobilinogen, UA: 0.2 E.U./dL
pH, UA: 6 (ref 5.0–8.0)

## 2018-10-28 MED ORDER — KETOCONAZOLE 2 % EX CREA: 1.0000 "application " | TOPICAL_CREAM | Freq: Two times a day (BID) | CUTANEOUS | 1 refills | Status: DC | PRN

## 2018-10-28 NOTE — Assessment & Plan Note (Addendum)
Fairly severe pain which persists for long periods Not constipated --so not related to that Did have normal period last week--but certainly concern for uterine or ovarian pathology (?cyst--though more midline) Doesn't really have urinary symptoms and urinalysis fairly benign Will check urine pregnancy--but no unsupervised time and no sex (so not PID either) Would be unusual place for diverticulitis ---and young---but possible Will check labs Abd/pelvic CT

## 2018-10-28 NOTE — Progress Notes (Signed)
Subjective:    Patient ID: [name], female    DOB: 10-Oct-1970, 48 y.o.   MRN: [id number]  HPI Here with mom due to pain---pelvic area  Points to her groin--goes back several days Chronic "stomach problems" per mom This seems worse--sharp --and she grabs down there Does get some nausea but no vomiting Moves bowels regularly---goes every day Has been independent in bathroom--but mom recently noticed some redness and "feverish looking"  Hard to tell from her history--but pain seems to persist States "it burns" but not really with voiding No blood in urine  Current Outpatient Medications on File Prior to Visit  Medication Sig Dispense Refill  . albuterol (PROVENTIL) (2.5 MG/3ML) 0.083% nebulizer solution USE 1 VIAL VIA NEBULIZER EVERY 6 HOURS AS NEEDED FOR WHEEZING OR SHORTNESS OF BREATH 75 mL 3  . allopurinol (ZYLOPRIM) 100 MG tablet TAKE 1 TABLET BY MOUTH EVERY DAY 90 tablet 3  . Cholecalciferol (VITAMIN D3 PO) Take 500 Units by mouth daily.    . clobetasol cream (TEMOVATE) 0.05 % APPLY TO AFFECTED AREA TWICE A DAY. 2 WEEKS AT A TIME 45 g 1  . colchicine 0.6 MG tablet TAKE 1 TABLET BY MOUTH EVERY DAY AS NEEDED FOR GOUT FLARE 30 tablet 5  . ketoconazole (NIZORAL) 2 % shampoo     . levETIRAcetam (KEPPRA) 500 MG tablet TAKE 1 TABLET EVERY 12 HOURS 180 tablet 3  . levothyroxine (SYNTHROID) 125 MCG tablet TAKE 1 TABLET BY MOUTH EVERY DAY BEFORE BREAKFAST 90 tablet 3  . meloxicam (MOBIC) 15 MG tablet TAKE 1 TABLET BY MOUTH EVERY DAY IN THE MORNING WITH FOOD FOR PAIN    . memantine (NAMENDA) 10 MG tablet Take 1 tablet (10 mg total) by mouth 2 (two) times daily. 180 tablet 3  . Multiple Vitamins-Minerals (WOMENS MULTI PO) Take 1 tablet by mouth daily.     . Naproxen (NAPROSYN PO) Take by mouth as needed.    . triamcinolone ointment (KENALOG) 0.1 % APPLY TO THE BODY NIGHTLY (NEVER THE FACE OR GROIN)     No current facility-administered medications on file prior to visit.     No  Known Allergies  Past Medical History:  Diagnosis Date  . Dementia due to another medical condition [facility])    Trisomy 21-related, sees Sethi yearly, nl CT head 2010, did not tolerate aricept  . Depression    with OCD  . Diabetes mellitus ([place])    "just when she was on TPN"  . DJD (degenerative joint disease) of knee    bilat knee arthritis, had 3/3 Supartz injections [facility])  . Down syndrome   . 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 Eczema   . Empyema of left pleural space ([place]) 09/24/2011  . Hypothyroid   . OCD (obsessive compulsive disorder)   . On home oxygen therapy    "1L just at night" (09/25/2014)  . OSA on CPAP 05/2012   severe OSA with AHI 118, desat nadir to 67%  . Pneumonia    multiple pneumonias  . 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 Seizure ([place]) 2013   x 2 ; "very mild"; during hospitalization "while coming off vent"  . Sleep-related hypoventilation 09/23/2012    Past Surgical History:  Procedure Laterality Date  . ACHILLES TENDON REPAIR Right 01/2017   Dr. Clarene [facility]  . ANTERIOR CRUCIATE LIGAMENT REPAIR Left   . BACK SURGERY    . FLEXIBLE BRONCHOSCOPY  09/24/2011   Procedure: FLEXIBLE BRONCHOSCOPY;  Surgeon: Valentina Gu  Cornelius Moraswen, MD;  Location: [facility] OR;  Service: Thoracic;  Laterality: N/A;  . LUMBAR DISC SURGERY     ruptured disc  . TRACHEOSTOMY TUBE PLACEMENT  10/11/2011   Procedure: TRACHEOSTOMY;  Surgeon: Serena ColonelJefry Rosen, MD;  [facility];  Service: ENT;  Laterality: N/A;  . VIDEO ASSISTED THORACOSCOPY (VATS) /THOROCOTOMY/RADIOACTIVE SEED IMPLANT Left 09/24/2011   VATS, (Dr. Barry Dieneswens)    Family History  Problem Relation Age of Onset  . Arthritis Mother   . Hypertension Father   . Diabetes Father   . Cancer Neg Hx   . Coronary artery disease Neg Hx   . Stroke Neg Hx     Social History   Socioeconomic History  . Marital status: Single    Spouse name: Not on file  . Number of children: 0  . Years of education: Not on file  . Highest education level: Not  on file  Occupational History  . Not on file  Social Needs  . Financial resource strain: Not on file  . Food insecurity    Worry: Not on file    Inability: Not on file  . Transportation needs    Medical: Not on file    Non-medical: Not on file  Tobacco Use  . Smoking status: Never Smoker  . Smokeless tobacco: Never Used  Substance and Sexual Activity  . Alcohol use: No  . Drug use: No  . Sexual activity: Never  Lifestyle  . Physical activity    Days per week: Not on file    Minutes per session: Not on file  . Stress: Not on file  Relationships  . Social Musicianconnections    Talks on phone: Not on file    Gets together: Not on file    Attends religious service: Not on file    Active member of club or organization: Not on file    Attends meetings of clubs or organizations: Not on file    Relationship status: Not on file  . Intimate partner violence    Fear of current or ex partner: Not on file    Emotionally abused: Not on file    Physically abused: Not on file    Forced sexual activity: Not on file  Other Topics Concern  . Not on file  Social History Narrative   Lives with mom and dad, dogs   Stays at daycare during the day   Activity: walking some   Diet: good water, fruits/vegetables daily   Review of Systems Had period last week--fairly normal Appetite is okay--trying to lose weight Drinks a lot of diet drinks No fever No unsupervised time---no sexual activity     Objective:   Physical Exam  Constitutional: No distress.  Neck: No thyromegaly present.  Cardiovascular: Normal rate, regular rhythm and normal heart sounds. Exam reveals no gallop.  No murmur heard. Respiratory: Effort normal and breath sounds normal. No respiratory distress. She has no wheezes. She has no rales.  GI: There is no rebound.  Firm Some suprapubic tenderness---diffuse  Musculoskeletal:        General: No edema.  Lymphadenopathy:    She has no cervical adenopathy.  Skin:  Mild  tinea rash in groin and inner thighs           Assessment & Plan:

## 2018-10-29 LAB — COMPREHENSIVE METABOLIC PANEL
ALT: 17 U/L (ref 0–35)
AST: 19 U/L (ref 0–37)
Albumin: 3.9 g/dL (ref 3.5–5.2)
Alkaline Phosphatase: 54 U/L (ref 39–117)
BUN: 16 mg/dL (ref 6–23)
CO2: 29 mEq/L (ref 19–32)
Calcium: 9.2 mg/dL (ref 8.4–10.5)
Chloride: 102 mEq/L (ref 96–112)
Creatinine, Ser: 1.04 mg/dL (ref 0.40–1.20)
GFR: 56.53 mL/min — ABNORMAL LOW (ref >60.00)
Glucose, Bld: 111 mg/dL — ABNORMAL HIGH (ref 70–99)
Potassium: 4.2 mEq/L (ref 3.5–5.1)
Sodium: 140 mEq/L (ref 135–145)
Total Bilirubin: 0.3 mg/dL (ref 0.2–1.2)
Total Protein: 7.3 g/dL (ref 6.0–8.3)

## 2018-10-29 LAB — CBC
HCT: 40 % (ref 36.0–46.0)
Hemoglobin: 13.3 g/dL (ref 12.0–15.0)
MCHC: 33.1 g/dL (ref 30.0–36.0)
MCV: 105.6 fl — ABNORMAL HIGH (ref 78.0–100.0)
Platelets: 222 10*3/uL (ref 150.0–400.0)
RBC: 3.79 Mil/uL — ABNORMAL LOW (ref 3.87–5.11)
RDW: 14.6 % (ref 11.5–15.5)
WBC: 6.5 10*3/uL (ref 4.0–10.5)

## 2018-11-01 ENCOUNTER — Ambulatory Visit
Admission: RE | Admit: 2018-11-01 | Discharge: 2018-11-01 | Disposition: A | Discharge status: Home / Self Care | Payer: Medicare Other | Source: Ambulatory Visit | Attending: Internal Medicine | Admitting: Internal Medicine

## 2018-11-01 ENCOUNTER — Other Ambulatory Visit: Payer: Self-pay

## 2018-11-01 DIAGNOSIS — R102 Pelvic and perineal pain: Secondary | ICD-10-CM | POA: Insufficient documentation

## 2018-11-01 MED ORDER — IOHEXOL 300 MG/ML  SOLN
100.0000 mL | Freq: Once | INTRAMUSCULAR | Status: AC | PRN
  Administered 2018-11-01: 100 mL via INTRAVENOUS

## 2018-11-18 ENCOUNTER — Ambulatory Visit: Payer: Medicare Other | Admitting: Podiatry

## 2018-12-02 ENCOUNTER — Other Ambulatory Visit: Payer: Self-pay

## 2018-12-02 ENCOUNTER — Encounter: Payer: Self-pay | Admitting: Podiatry

## 2018-12-02 ENCOUNTER — Ambulatory Visit (INDEPENDENT_AMBULATORY_CARE_PROVIDER_SITE_OTHER): Payer: Medicare Other | Admitting: Podiatry

## 2018-12-02 DIAGNOSIS — M79676 Pain in unspecified toe(s): Secondary | ICD-10-CM | POA: Diagnosis not present

## 2018-12-02 DIAGNOSIS — B351 Tinea unguium: Secondary | ICD-10-CM

## 2018-12-02 NOTE — Patient Instructions (Signed)

## 2018-12-04 NOTE — Progress Notes (Signed)
Subjective: [name] is seen today for follow up painful, elongated, thickened toenails 1-5 b/l feet that she cannot cut. Pain interferes with daily activities. Aggravating factor includes wearing enclosed shoe gear and relieved with periodic debridement.  Current Outpatient Medications on File Prior to Visit  Medication Sig  . albuterol (PROVENTIL) (2.5 MG/3ML) 0.083% nebulizer solution USE 1 VIAL VIA NEBULIZER EVERY 6 HOURS AS NEEDED FOR WHEEZING OR SHORTNESS OF BREATH  . allopurinol (ZYLOPRIM) 100 MG tablet TAKE 1 TABLET BY MOUTH EVERY DAY  . Cholecalciferol (VITAMIN D3 PO) Take 500 Units by mouth daily.  . clobetasol cream (TEMOVATE) 0.05 % APPLY TO AFFECTED AREA TWICE A DAY. 2 WEEKS AT A TIME  . colchicine 0.6 MG tablet TAKE 1 TABLET BY MOUTH EVERY DAY AS NEEDED FOR GOUT FLARE  . ketoconazole (NIZORAL) 2 % cream Apply 1 application topically 2 (two) times daily as needed for irritation.  Marland Kitchen ketoconazole (NIZORAL) 2 % shampoo   . levETIRAcetam (KEPPRA) 500 MG tablet TAKE 1 TABLET EVERY 12 HOURS  . levothyroxine (SYNTHROID) 125 MCG tablet TAKE 1 TABLET BY MOUTH EVERY DAY BEFORE BREAKFAST  . meloxicam (MOBIC) 15 MG tablet TAKE 1 TABLET BY MOUTH EVERY DAY IN THE MORNING WITH FOOD FOR PAIN  . memantine (NAMENDA) 10 MG tablet Take 1 tablet (10 mg total) by mouth 2 (two) times daily.  . Multiple Vitamins-Minerals (WOMENS MULTI PO) Take 1 tablet by mouth daily.   . Naproxen (NAPROSYN PO) Take by mouth as needed.  . traMADol (ULTRAM) 50 MG tablet tramadol 50 mg tablet  TAKE 1 TABLET EVERY 6 HOURS AS NEEDED FOR PAIN  . triamcinolone ointment (KENALOG) 0.1 % APPLY TO THE BODY NIGHTLY (NEVER THE FACE OR GROIN)   No current facility-administered medications on file prior to visit.      No Known Allergies   Objective:  Vascular Examination: Capillary refill time immediate x 10 digits.  Dorsalis pedis present b/l.  Posterior tibial pulses present b/l.  Digital hair sparse x 10  digits.  Skin temperature gradient WNL b/l.   Dermatological Examination: Skin with normal turgor, texture and tone b/l.  Toenails 1-5 b/l discolored, thick, dystrophic with subungual debris and pain with palpation to nailbeds due to thickness of nails.  Musculoskeletal: Muscle strength 5/5 to all LE muscle groups b/l.  Mild HAV with bunion b/l.  No pain, crepitus or joint limitation noted with ROM.   Neurological Examination: Protective sensation intact with 10 gram monofilament bilaterally.  Epicritic sensation present bilaterally.  Vibratory sensation intact bilaterally.   Assessment: Painful onychomycosis toenails 1-5 b/l   Plan: 1. Toenails 1-5 b/l were debrided in length and girth without iatrogenic bleeding. 2. Patient to continue soft, supportive shoe gear 3. Patient to report any pedal injuries to medical professional immediately. 4. Follow up 3 months.  5. Patient/POA to call should there be a concern in the interim.

## 2018-12-25 ENCOUNTER — Telehealth: Payer: Self-pay | Admitting: Family Medicine

## 2018-12-25 NOTE — Telephone Encounter (Signed)
Filled and in Lisa's box. They need to fill out top part.

## 2018-12-25 NOTE — Telephone Encounter (Signed)
Placed form in Dr. G's box.  

## 2018-12-25 NOTE — Telephone Encounter (Signed)
[name] dropped off a form to be filled out for [facility]. States it needs to be completed and sent in by 10/31. Placed in [place] tower.

## 2018-12-26 NOTE — Telephone Encounter (Signed)
Per front office, pt's mom came in to complete and sign top portion of form.  Faxed form.  Mailed original back to pt/pt's mom.   [Made copy to scan and one for billing.]

## 2018-12-26 NOTE — Telephone Encounter (Addendum)
Left message on vm for pt's mom, Wells Guiles (on dpr), to call back.  Form has been signed by Dr. Darnell Level but the top portion needs to completed and signed before it can be faxed.  Also, there is a $5.00 fee to have form completed.   *Note for form to be faxed to [phone].* [Placed form at front office.]

## 2019-01-08 DIAGNOSIS — G5602 Carpal tunnel syndrome, left upper limb: Secondary | ICD-10-CM | POA: Insufficient documentation

## 2019-03-03 ENCOUNTER — Telehealth (INDEPENDENT_AMBULATORY_CARE_PROVIDER_SITE_OTHER): Payer: Medicare PPO | Admitting: Adult Health

## 2019-03-03 ENCOUNTER — Encounter: Payer: Self-pay | Admitting: Adult Health

## 2019-03-03 DIAGNOSIS — R569 Unspecified convulsions: Secondary | ICD-10-CM | POA: Diagnosis not present

## 2019-03-03 DIAGNOSIS — F0390 Unspecified dementia without behavioral disturbance: Secondary | ICD-10-CM

## 2019-03-03 DIAGNOSIS — Q909 Down syndrome, unspecified: Secondary | ICD-10-CM | POA: Diagnosis not present

## 2019-03-03 MED ORDER — LEVETIRACETAM 500 MG PO TABS: ORAL_TABLET | ORAL | 3 refills | Status: DC

## 2019-03-03 MED ORDER — MEMANTINE HCL 10 MG PO TABS: 10.0000 mg | ORAL_TABLET | Freq: Two times a day (BID) | ORAL | 3 refills | Status: DC

## 2019-03-03 NOTE — Progress Notes (Signed)
[facility]  PATIENT: [name] DOB: Jan 20, 1971   REASON FOR VISIT: Follow-up for seizure disorder, [name] and memory loss HISTORY FROM: Patient and mom  Virtual Visit via Video Note  I connected with [name] on 03/03/19 at 12:45 PM EST by a video enabled telemedicine application located remotely in my own home and verified that I am speaking with the correct person using two identifiers who was located at their own home accompanied by her mother.   Christine Skeans, RN schedule visit who discussed the limitations of evaluation and management by telemedicine and the availability of in person appointments. The patient expressed understanding and agreed to proceed.   HISTORY OF PRESENT ILLNESS:  Update 03/03/2019: [name] is a 49 year old female who is being seen today for 1 year follow-up with history of seizure disorder, Down syndrome and memory loss.  She remains on Keppra 500 mg twice daily tolerating well without recent seizure activity.  Continues on Namenda 10 mg twice daily and memory has been stable.  She also has a history of OSA and continues use of CPAP with ongoing follow-up by Dr. Craige Cotta.  She is currently prescribed multiple as needed pain medications such as tramadol, naproxen and meloxicam due to ongoing knee pain unable to undergo knee replacement due to being overweight.  Continues to attempt weight loss but due to limited mobility, this has been difficult.  No further concerns at this time.    UPDATE 12/30/2019CM [name], 49 year old female returns for follow-up with history of seizure disorder Down syndrome and memory loss.  She also has a history of obstructive sleep apnea but her CPAP is followed by Dr. Craige Cotta.  She remains on Keppra 500 twice daily without further seizure activity.  She is on Namenda 10 mg twice daily and her memory is stable according to the mother.  She is having problems with her left knee and needs to have knee  replacement however she needs to lose weight prior to the surgery according to mom.  She no longer goes to adult daycare but helps her mom around the house.  No behavior issues..  She returns for reevaluation   UPDATE 12/26/2018CM [name], 49 year old female returns for follow-up with a history of Down syndrome and cognitive decline over several years.  She also has a seizure disorder which is currently well controlled on Keppra 500 mg twice daily.  She remains on Namenda 10 mg twice daily.  She gets little exercise and is overweight.  She continues to go to adult care 5 days a week.  She has a history of obstructive sleep apnea and is on CPAP with additional oxygen at night.  No recent behavior issues.  She has a history of gout.  She had recent ankle surgery for a torn tendon and is wearing a boot on the right foot.  No recent behavior issues.  Returns for reevaluation UPDATE 12/15/2015 CM [name], 49 year old female returns for followup with her mother. She has a history of Downs syndrome and  cognitive decline over several years as well as seizure disorder. She is currently well controlled on Keppra and she is on Namenda without side effects to either drug. She is overweight, gets little exercise, lives at home. She goes to adult daycare 5 days a week . She has been having a lot of abdominal pain and diarrhea, she has appointment with GI in December.  She occasionally has behavior problems and was recently placed on Zoloft  however it made her irritability worse and she is slowly titrating off this. She needs refills on Keppra and Namenda . She returns for reevaluation Update 10/14/16PS : She returns for follow-up after last visit a year ago. She is a complaint by sister who provides most of the history. She continues to do well and has not had any breakthrough seizures now for a few years. Her memory and cognitive difficulties remain unchanged. She requires close supervision at home and does spend the  day at the daycare center. She can feed dress herself and bathe herself. She is never left alone. There have been no issues with agitation, behavior, delusions or hallucinations or safety concerns. Her gait imbalance remained stable and she has had no falls.  HISTORY: [name] is a 89 year year old Caucasian lady with lifelong history of Down`s syndrome and cognitive decline in last several years from dementia who is referred back to see need for a new problem of seizures. She had recent prolonged hospital stay [facility] pneumonia with empyema and ventilator dependent respiratory failure. She underwent video assisted thoracotomy for empyma drainage. She required prolonged antibiotics and was eventually required tracheostomy. She had 2 brief episodes of what sound like complex partial seizures one week apart in September 2013. Her mother who is present today was eyewitness to both episodes. She was described as staring and unresponsive not following commands with a distant look on her face lasting a few minutes followed by a period of disorientation and tiredness. In the second episode she had some clonic activity in both her upper extremities. She was started on Keppra 500 twice a day falling and abnormal EEG on 11/04/2011 and with showed sharp waves in the left occipital parietal region. She was initially off her Namenda and Prozac in the hospital stay and had some behavioral agitation but settled down after the medicines were restarted. She has done well since discharge and has not had any more seizures. She seems to be tolerating Keppra quite well without any side effects. She is finished a course of antibiotics and home physical and occupation therapy. She is back living at home with mom and plans to start attending adult daycare soon. She apparently did not have any brain imaging study done.  03/11/12: [name] returns for followup. She was last seen by Dr.  [name] 12/11/11: She had prolonged hospitilization in Sept 2013 and had seizure events. She was started on Keppra at that time. No further seizure events. MRI of the brain 12/18/11 was normal. EEG was abnormal suggestive of mild focal irritability in right frontal and temporal regions but no definite epileptiform  activity is noted. ROS neg.    REVIEW OF SYSTEMS: Full 14 system review of systems performed and notable only for those listed, all others are neg:  Constitutional: neg  Cardiovascular: neg Ear/Nose/Throat: neg  Skin: neg Eyes: neg Respiratory: neg Gastroitestinal: neg Hematology/Lymphatic: neg  Endocrine: neg Musculoskeletal:neg Allergy/Immunology: neg Neurological: History of seizure disorder, Down syndrome, mild cognitive impairment Psychiatric: neg Sleep : neg   ALLERGIES: No Known Allergies  HOME MEDICATIONS: Outpatient Medications Prior to Visit  Medication Sig Dispense Refill  . albuterol (PROVENTIL) (2.5 MG/3ML) 0.083% nebulizer solution USE 1 VIAL VIA NEBULIZER EVERY 6 HOURS AS NEEDED FOR WHEEZING OR SHORTNESS OF BREATH 75 mL 3  . allopurinol (ZYLOPRIM) 100 MG tablet TAKE 1 TABLET BY MOUTH EVERY DAY 90 tablet 3  . Cholecalciferol (VITAMIN D3 PO) Take 500 Units  by mouth daily.    . clobetasol cream (TEMOVATE) 0.05 % APPLY TO AFFECTED AREA TWICE A DAY. 2 WEEKS AT A TIME 45 g 1  . colchicine 0.6 MG tablet TAKE 1 TABLET BY MOUTH EVERY DAY AS NEEDED FOR GOUT FLARE 30 tablet 5  . ketoconazole (NIZORAL) 2 % cream Apply 1 application topically 2 (two) times daily as needed for irritation. 60 g 1  . ketoconazole (NIZORAL) 2 % shampoo     . levETIRAcetam (KEPPRA) 500 MG tablet TAKE 1 TABLET EVERY 12 HOURS 180 tablet 3  . levothyroxine (SYNTHROID) 125 MCG tablet TAKE 1 TABLET BY MOUTH EVERY DAY BEFORE BREAKFAST 90 tablet 3  . meloxicam (MOBIC) 15 MG tablet TAKE 1 TABLET BY MOUTH EVERY DAY IN THE MORNING WITH FOOD FOR PAIN    . memantine (NAMENDA) 10 MG tablet Take 1 tablet  (10 mg total) by mouth 2 (two) times daily. 180 tablet 3  . Multiple Vitamins-Minerals (WOMENS MULTI PO) Take 1 tablet by mouth daily.     . Naproxen (NAPROSYN PO) Take by mouth as needed.    . traMADol (ULTRAM) 50 MG tablet tramadol 50 mg tablet  TAKE 1 TABLET EVERY 6 HOURS AS NEEDED FOR PAIN    . triamcinolone ointment (KENALOG) 0.1 % APPLY TO THE BODY NIGHTLY (NEVER THE FACE OR GROIN)     No facility-administered medications prior to visit.    PAST MEDICAL HISTORY: Past Medical History:  Diagnosis Date  . Dementia due to another medical condition [facility])    Trisomy 21-related, sees Sethi yearly, nl CT head 2010, did not tolerate aricept  . Depression    with OCD  . Diabetes mellitus ([place])    "just when she was on TPN"  . DJD (degenerative joint disease) of knee    bilat knee arthritis, had 3/3 Supartz injections [facility])  . Down syndrome   . 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 Eczema   . Empyema of left pleural space ([place]) 09/24/2011  . Hypothyroid   . OCD (obsessive compulsive disorder)   . On home oxygen therapy    "1L just at night" (09/25/2014)  . OSA on CPAP 05/2012   severe OSA with AHI 118, desat nadir to 67%  . Pneumonia    multiple pneumonias  . 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 Seizure ([place]) 2013   x 2 ; "very mild"; during hospitalization "while coming off vent"  . Sleep-related hypoventilation 09/23/2012    PAST SURGICAL HISTORY: Past Surgical History:  Procedure Laterality Date  . ACHILLES TENDON REPAIR Right 01/2017   Dr. Clarene [facility]  . ANTERIOR CRUCIATE LIGAMENT REPAIR Left   . BACK SURGERY    . FLEXIBLE BRONCHOSCOPY  09/24/2011   Procedure: FLEXIBLE BRONCHOSCOPY;  Surgeon: Rexene Alberts, MD;  Location: [place];  Service: Thoracic;  Laterality: N/A;  . LUMBAR DISC SURGERY     ruptured disc  . TRACHEOSTOMY TUBE PLACEMENT  10/11/2011   Procedure: TRACHEOSTOMY;  Surgeon: Izora Gala, MD;  Location: [facility] OR;  Service: ENT;  Laterality: N/A;  . VIDEO  ASSISTED THORACOSCOPY (VATS) /THOROCOTOMY/RADIOACTIVE SEED IMPLANT Left 09/24/2011   VATS, (Dr. Ricard Dillon)    FAMILY HISTORY: Family History  Problem Relation Age of Onset  . Arthritis Mother   . Hypertension Father   . Diabetes Father   . Cancer Neg Hx   . Coronary artery disease Neg Hx   . Stroke Neg Hx     SOCIAL HISTORY: Social History   Socioeconomic  History  . Marital status: Single    Spouse name: Not on file  . Number of children: 0  . Years of education: Not on file  . Highest education level: Not on file  Occupational History  . Not on file  Tobacco Use  . Smoking status: Never Smoker  . Smokeless tobacco: Never Used  Substance and Sexual Activity  . Alcohol use: No  . Drug use: No  . Sexual activity: Never  Other Topics Concern  . Not on file  Social History Narrative   Lives with mom and dad, dogs   Stays at daycare during the day   Activity: walking some   Diet: good water, fruits/vegetables daily   Social Determinants of Health   Financial Resource Strain:   . Difficulty of Paying Living Expenses: Not on file  Food Insecurity:   . Worried About Programme researcher, broadcasting/film/videounning Out of Food in the Last Year: Not on file  . Ran Out of Food in the Last Year: Not on file  Transportation Needs:   . Lack of Transportation (Medical): Not on file  . Lack of Transportation (Non-Medical): Not on file  Physical Activity:   . Days of Exercise per Week: Not on file  . Minutes of Exercise per Session: Not on file  Stress:   . Feeling of Stress : Not on file  Social Connections:   . Frequency of Communication with Friends and Family: Not on file  . Frequency of Social Gatherings with Friends and Family: Not on file  . Attends Religious Services: Not on file  . Active Member of Clubs or Organizations: Not on file  . Attends BankerClub or Organization Meetings: Not on file  . Marital Status: Not on file  Intimate Partner Violence:   . Fear of Current or Ex-Partner: Not on file  . Emotionally  Abused: Not on file  . Physically Abused: Not on file  . Sexually Abused: Not on file     PHYSICAL EXAM  General: well nourished with Down's features, pleasant middle-age Caucasian female, seated, in no evident distress Head: head normocephalic and atraumatic.    Neurologic Exam Mental Status: Awake and fully alert.  Normal speech and language but with childlike quality.  Oriented to place and time. Recent and remote memory diminished. Attention span, concentration and fund of knowledge diminished and needs assistance from her mother. Mood and affect appropriate.  Cranial Nerves: Extraocular movements full without nystagmus. Hearing intact to voice. Facial sensation intact. Face, tongue, palate moves normally and symmetrically.  Shoulder shrug symmetric. Motor: No evidence of weakness per drift assessment Sensory.: intact to light touch Coordination: Rapid alternating movements normal in all extremities. Finger-to-nose and heel-to-shin performed accurately bilaterally. Gait and Station: Arises from chair without difficulty. Stance is normal. Gait demonstrates normal stride length and balance .  Reflexes: UTA    DIAGNOSTIC DATA (LABS, IMAGING, TESTING) - I reviewed patient records, labs, notes, testing and imaging myself where available.  Lab Results  Component Value Date   WBC 6.5 10/28/2018   HGB 13.3 10/28/2018   HCT 40.0 10/28/2018   MCV 105.6 (H) 10/28/2018   PLT 222.0 10/28/2018      Component Value Date/Time   NA 140 10/28/2018 1625   K 4.2 10/28/2018 1625   CL 102 10/28/2018 1625   CO2 29 10/28/2018 1625   GLUCOSE 111 (H) 10/28/2018 1625   BUN 16 10/28/2018 1625   CREATININE 1.04 10/28/2018 1625   CALCIUM 9.2 10/28/2018 1625   PROT 7.3  10/28/2018 1625   ALBUMIN 3.9 10/28/2018 1625   AST 19 10/28/2018 1625   ALT 17 10/28/2018 1625   ALKPHOS 54 10/28/2018 1625   BILITOT 0.3 10/28/2018 1625   GFRNONAA >60 09/27/2014 0700   GFRAA >60 09/27/2014 0700    Lab  Results  Component Value Date   HGBA1C 5.6 08/06/2018   Lab Results  Component Value Date   VITAMINB12 482 08/29/2016   Lab Results  Component Value Date   TSH 1.48 08/06/2018      ASSESSMENT AND PLAN 49 y.o. year old female  has a past medical history of Downs syndrome; Depression;  Seizure; OSA on CPAP (05/2012);  and memory loss being seen today, 03/03/2019, via virtual visit for 1 year follow-up.  She has been stable in regards to seizure and memory with ongoing use of Keppra and Namenda.   Continue Namenda 10 mg twice daily - refill provided Continue Keppra 500 mg twice daily -refill provided Continue with CPAP for obstructive sleep apnea Dr. Craige Cotta Call for any seizure activity or concerns for worsening memory  Follow-up in 1 year or call earlier if needed  Greater than 50% of this 15-minute nonface-to-face visit was spent discussing history of seizures which have been stable with ongoing use of Keppra and history of memory loss which has been stable with ongoing use of Namenda.  Also discussed medication use due to ongoing left knee pain and potential replacement in the future and answered all questions to patient and mother satisfaction.  Ihor Austin, AGNP-BC  [facility] [address] [place], [place] [address]  Phone [phone] Fax [phone] Note: This document was prepared with digital dictation and possible smart phrase technology. Any transcriptional errors that result from this process are unintentional.

## 2019-03-04 NOTE — Progress Notes (Signed)
I agree with the above plan 

## 2019-03-07 ENCOUNTER — Ambulatory Visit: Payer: Medicare Other | Admitting: Podiatry

## 2019-03-10 ENCOUNTER — Ambulatory Visit (INDEPENDENT_AMBULATORY_CARE_PROVIDER_SITE_OTHER): Payer: Medicare PPO | Admitting: Podiatry

## 2019-03-10 ENCOUNTER — Other Ambulatory Visit: Payer: Self-pay

## 2019-03-10 ENCOUNTER — Encounter: Payer: Self-pay | Admitting: Podiatry

## 2019-03-10 DIAGNOSIS — M79676 Pain in unspecified toe(s): Secondary | ICD-10-CM | POA: Diagnosis not present

## 2019-03-10 DIAGNOSIS — B351 Tinea unguium: Secondary | ICD-10-CM

## 2019-03-10 NOTE — Progress Notes (Signed)
Subjective: [name] presents today for preventative diabetic foot. Patient is seen for follow up of painful, mycotic toenails which interfere with comfortable ambulation when wearing enclosed shoe gear. Pain is relieved with periodic professional debridement.  Medications reviewed in chart.  No Known Allergies  Objective: There were no vitals filed for this visit.  Neurovascular status normal and unchanged.  Dermatological Examination: Skin with normal turgor, texture and tone b/l.  Toenails 1-5 b/l discolored, thick, dystrophic with subungual debris and pain with palpation to nailbeds due to thickness of nails.  Musculoskeletal: Muscle strength 5/5 b/l to all LE muscle groups.  Gross bony deformities:  HAV with bunion b/l.  No pain, crepitus or joint limitation with passive/active ROM b/l.  Assessment: 1. Painful onychomycosis toenails 1-5 b/l  Plan: 1. No new findings. No new orders. 2. Toenails 1-5 b/l were debrided in length and girth without iatrogenic bleeding. 3. Patient to continue soft, supportive shoe gear. 4. Patient to report any pedal injuries to medical professional. 5. Follow up 3 months.  6. Patient/POA to call should there be a concern in the interim.

## 2019-03-10 NOTE — Patient Instructions (Signed)

## 2019-06-02 ENCOUNTER — Other Ambulatory Visit: Payer: Self-pay

## 2019-06-02 ENCOUNTER — Ambulatory Visit (INDEPENDENT_AMBULATORY_CARE_PROVIDER_SITE_OTHER): Payer: Medicare PPO

## 2019-06-02 ENCOUNTER — Ambulatory Visit (INDEPENDENT_AMBULATORY_CARE_PROVIDER_SITE_OTHER): Payer: Medicare PPO | Admitting: Podiatry

## 2019-06-02 VITALS — Temp 96.8°F

## 2019-06-02 DIAGNOSIS — M79676 Pain in unspecified toe(s): Secondary | ICD-10-CM

## 2019-06-02 DIAGNOSIS — B351 Tinea unguium: Secondary | ICD-10-CM | POA: Diagnosis not present

## 2019-06-02 DIAGNOSIS — M7671 Peroneal tendinitis, right leg: Secondary | ICD-10-CM | POA: Diagnosis not present

## 2019-06-02 DIAGNOSIS — M25571 Pain in right ankle and joints of right foot: Secondary | ICD-10-CM

## 2019-06-02 NOTE — Patient Instructions (Addendum)
Rest - Get plenty of sleep and take it easy during the day Ice - Ice the injured area often, no more than 20 minutes at a time Compression - Keep the injured area wrapped as instructed Elevate - Elevating the injury will reduce swelling   Ankle Sprain  An ankle sprain is a stretch or tear in one of the tough tissues (ligaments) that connect the bones in your ankle. An ankle sprain can happen when the ankle rolls outward (inversion sprain) or inward (eversion sprain). What are the causes? This condition is caused by rolling or twisting the ankle. What increases the risk? You are more likely to develop this condition if you play sports. What are the signs or symptoms? Symptoms of this condition include:  Pain in your ankle.  Swelling.  Bruising. This may happen right after you sprain your ankle or 1-2 days later.  Trouble standing or walking. How is this diagnosed? This condition is diagnosed with:  A physical exam. During the exam, your doctor will press on certain parts of your foot and ankle and try to move them in certain ways.  X-ray imaging. These may be taken to see how bad the sprain is and to check for broken bones. How is this treated? This condition may be treated with:  A brace or splint. This is used to keep the ankle from moving until it heals.  An elastic bandage. This is used to support the ankle.  Crutches.  Pain medicine.  Surgery. This may be needed if the sprain is very bad.  Physical therapy. This may help to improve movement in the ankle. Follow these instructions at home: If you have a brace or a splint:  Wear the brace or splint as told by your doctor. Remove it only as told by your doctor.  Loosen the brace or splint if your toes: ? Tingle. ? Lose feeling (become numb). ? Turn cold and blue.  Keep the brace or splint clean.  If the brace or splint is not waterproof: ? Do not let it get wet. ? Cover it with a watertight covering when you  take a bath or a shower. If you have an elastic bandage (dressing):  Remove it to shower or bathe.  Try not to move your ankle much, but wiggle your toes from time to time. This helps to prevent swelling.  Adjust the dressing if it feels too tight.  Loosen the dressing if your foot: ? Loses feeling. ? Tingles. ? Becomes cold and blue. Managing pain, stiffness, and swelling   Take over-the-counter and prescription medicines only as told by doctor.  For 2-3 days, keep your ankle raised (elevated) above the level of your heart.  If told, put ice on the injured area: ? If you have a removable brace or splint, remove it as told by your doctor. ? Put ice in a plastic bag. ? Place a towel between your skin and the bag. ? Leave the ice on for 20 minutes, 2-3 times a day. General instructions  Rest your ankle.  Do not use your injured leg to support your body weight until your doctor says that you can. Use crutches as told by your doctor.  Do not use any products that contain nicotine or tobacco, such as cigarettes, e-cigarettes, and chewing tobacco. If you need help quitting, ask your doctor.  Keep all follow-up visits as told by your doctor. Contact a doctor if:  Your bruises or swelling are quickly getting worse.  Your pain does not get better after you take medicine. Get help right away if:  You cannot feel your toes or foot.  Your foot or toes look blue.  You have very bad pain that gets worse. Summary  An ankle sprain is a stretch or tear in one of the tough tissues (ligaments) that connect the bones in your ankle.  This condition is caused by rolling or twisting the ankle.  Symptoms include pain, swelling, bruising, and trouble walking.  To help with pain and swelling, put ice on the injured ankle, raise your ankle above the level of your heart, and use an elastic bandage. Also, rest as told by your doctor.  Keep all follow-up visits as told by your doctor. This  is important. This information is not intended to replace advice given to you by your health care provider. Make sure you discuss any questions you have with your health care provider. Document Revised: 07/10/2017 Document Reviewed: 07/10/2017 Elsevier Patient Education  [place].

## 2019-06-03 ENCOUNTER — Encounter: Payer: Self-pay | Admitting: Podiatry

## 2019-06-03 ENCOUNTER — Other Ambulatory Visit: Payer: Self-pay | Admitting: Podiatry

## 2019-06-03 DIAGNOSIS — M7671 Peroneal tendinitis, right leg: Secondary | ICD-10-CM

## 2019-06-03 NOTE — Progress Notes (Signed)
Subjective: [name] presents today for follow up of with chief concern of painful right ankle. Pt's father relates she often c/o ankle and knee pain. She has been seen by Ortho for her knee and needs a total knee replacement.   Father states she was instructed to lose weight before surgery will be considered. Also seen routinely by [place] for painful mycotic nails b/l that are difficult to trim. Pain interferes with ambulation. Aggravating factors include wearing enclosed shoe gear. Pain is relieved with periodic professional debridement.   No Known Allergies   Objective: Vitals:   06/02/19 1025  Temp: (!) 96.8 F (36 C)   Pt is a 49 y.o. Caucasian female, morbidly obese, in NAD. AAO x 3.  Vascular Examination:  Capillary refill time to digits immediate b/l. Palpable DP pulses b/l. Palpable PT pulses b/l. Pedal hair present b/l. Skin temperature gradient within normal limits b/l. Trace edema noted b/l feet.  Dermatological Examination: Pedal skin with normal turgor, texture and tone bilaterally. No open wounds bilaterally. No interdigital macerations bilaterally. Toenails 1-5 b/l elongated, dystrophic, thickened, crumbly with subungual debris and tenderness to dorsal palpation.  Musculoskeletal: Normal muscle strength 5/5 to all lower extremity muscle groups bilaterally, bunion deformity noted b/l and tenderness along PB tendon insertion right LE. No warmth, no edema, no erythema, no palpable defect.  Neurological: Protective sensation intact 5/5 intact bilaterally with 10g monofilament b/l Vibratory sensation intact b/l.  Xray findings right ankle: no gas in tissues and osteophyte noted dorsal talonavicular joint and appears to be old injury.  Assessment: 1. Pain due to onychomycosis of toenail   2. Tendinitis of right peroneus brevis tendon    Plan: -Toenails 1-5 b/l were debrided in length and girth with sterile nail nippers and dremel without iatrogenic bleeding.   -Recommended RICE regimen. Verbal and visual education used to apply ace bandage to ankle daily. Instructed father on how to check circulation of digits. -Xrays taken and reviewed of right ankle in office with father/patient. -Patient to continue soft, supportive shoe gear daily. -Patient to report any pedal injuries to medical professional immediately. -Patient/POA to call should there be question/concern in the interim.  Return in about 3 months (around 09/01/2019) for nail trim.

## 2019-06-16 ENCOUNTER — Telehealth: Payer: Self-pay

## 2019-06-16 NOTE — Telephone Encounter (Signed)
Patient's mother called left message stating patient is still having a lot of pain in her ankle and was wanting to know what else can be done or can a MRI be ordered.   I tried to call mother [name] back and left message to call back to discuss.

## 2019-06-17 ENCOUNTER — Telehealth: Payer: Self-pay | Admitting: Podiatry

## 2019-06-17 NOTE — Telephone Encounter (Signed)
I instructed pt's mtr to get pt back in the surgery boot or shoe if available and I would have schedulers call to get appt with Dr. Logan Bores.

## 2019-06-17 NOTE — Telephone Encounter (Signed)
Pt mother called to discuss what can be done for pt still having ankle pain surgical foot

## 2019-06-17 NOTE — Telephone Encounter (Signed)
Pt mom called back to discuss anything that can be done for pt still having ankle pain in surgical foot

## 2019-06-30 ENCOUNTER — Other Ambulatory Visit: Payer: Self-pay | Admitting: Family Medicine

## 2019-06-30 DIAGNOSIS — Z1231 Encounter for screening mammogram for malignant neoplasm of breast: Secondary | ICD-10-CM

## 2019-07-02 ENCOUNTER — Ambulatory Visit (INDEPENDENT_AMBULATORY_CARE_PROVIDER_SITE_OTHER): Payer: Medicare PPO | Admitting: Pulmonary Disease

## 2019-07-02 ENCOUNTER — Encounter: Payer: Self-pay | Admitting: Pulmonary Disease

## 2019-07-02 ENCOUNTER — Other Ambulatory Visit: Payer: Self-pay

## 2019-07-02 ENCOUNTER — Ambulatory Visit (INDEPENDENT_AMBULATORY_CARE_PROVIDER_SITE_OTHER): Payer: Medicare PPO | Admitting: Podiatry

## 2019-07-02 VITALS — BP 126/72 | HR 79 | Temp 98.0°F | Ht <= 58 in | Wt 240.2 lb

## 2019-07-02 DIAGNOSIS — M7671 Peroneal tendinitis, right leg: Secondary | ICD-10-CM

## 2019-07-02 DIAGNOSIS — Z9989 Dependence on other enabling machines and devices: Secondary | ICD-10-CM | POA: Diagnosis not present

## 2019-07-02 DIAGNOSIS — G4730 Sleep apnea, unspecified: Secondary | ICD-10-CM | POA: Diagnosis not present

## 2019-07-02 DIAGNOSIS — G4733 Obstructive sleep apnea (adult) (pediatric): Secondary | ICD-10-CM

## 2019-07-02 DIAGNOSIS — E669 Obesity, unspecified: Secondary | ICD-10-CM | POA: Diagnosis not present

## 2019-07-02 NOTE — Patient Instructions (Signed)
Follow up in 1 year.

## 2019-07-02 NOTE — Progress Notes (Signed)
[facility]  Chief Complaint  Patient presents with  . Follow-up    C-Pap every night, SOB with activity     Constitutional:  BP 126/72 (BP Location: Left Arm)   Pulse 79   Temp 98 F (36.7 C) (Temporal)   Ht 4\' 10"  (1.473 m)   Wt 240 lb 3.2 oz (109 kg)   SpO2 98%   BMI 50.20 kg/m   Past Medical History:  Dementia, Trisomy 21, Depression, OCD, DM, DJD, Eczema, Lt pleural space empyema, Hypothyroidism, PNA, Seizure  Summary:  [name] is a 49 y.o. female with severe OSA. She has prior hx of pneumonia, empyema, and respiratory failure requiring tracheostomy.  Subjective:  She is no longer using supplemental oxygen.  Her father reports that she uses CPAP nightly and no issues with set up.  Not napping during the day.  Received COVID vaccine.  Has f/u with podiatry scheduled for tendinitis of Rt peroneus brevis tendon.  Physical Exam:   Appearance - well kempt   ENMT - no sinus tenderness, no oral exudate, no LAN, Mallampati 3 airway, no stridor, 2+ tonsils  Respiratory - equal breath sounds bilaterally, no wheezing or rales  CV - s1s2 regular rate and rhythm, no murmurs  Ext - no clubbing, no edema  Skin - no rashes  Psych - normal mood and affect  Assessment/Plan:   Obstructive sleep apnea. - she is compliant with CPAP and reports benefit from therapy - continue auto CPAP  Obesity. - discussed importance of weight loss  Seizure disorder. - followed by Dr. 52 with [facility]  A total of  22 minutes spent addressing patient care issues on day of visit.  Follow up:  Patient Instructions  Follow up in 1 year  Signature:  [facility], MD [facility] Pager: [phone] 07/02/2019, 9:39 AM  Flow Sheet    Sleep tests:  PSG 06/02/12 >> AHI 118.3, SpO2 low 67% ONO with CPAP and 1 liter 07/18/12 >>Test time 7 hrs 44 min. Basal SpO2 96%, SpO2 low 81%. Spent 0.7% of time with SpO2  <88%. Auto CPAP 07/07/17 to 08/05/17 >> used on 30 of 30 nights with average 9 hrs 18 min.  Average AHI 0.8 with median CPAP 11 and 95 th percentile CPAP 12 cm H2O ONO with CPAP 08/18/17 >> test tim 6 hrs 53 min.  Baseline SpO2 96%, low SpO2 82%.  Spent 5 mins 44 sec with SpO2 < 88%.  Cardiac tests:  Echo 09/27/14 >> EF 55 to 60%  Medications:   Allergies as of 07/02/2019   No Known Allergies     Medication List       Accurate as of Jul 02, 2019  9:39 AM. If you have any questions, ask your nurse or doctor.        albuterol (2.5 MG/3ML) 0.083% nebulizer solution Commonly known as: PROVENTIL USE 1 VIAL VIA NEBULIZER EVERY 6 HOURS AS NEEDED FOR WHEEZING OR SHORTNESS OF BREATH   allopurinol 100 MG tablet Commonly known as: ZYLOPRIM TAKE 1 TABLET BY MOUTH EVERY DAY   clobetasol cream 0.05 % Commonly known as: TEMOVATE APPLY TO AFFECTED AREA TWICE A DAY. 2 WEEKS AT A TIME   colchicine 0.6 MG tablet TAKE 1 TABLET BY MOUTH EVERY DAY AS NEEDED FOR GOUT FLARE   ketoconazole 2 % shampoo Commonly known as: NIZORAL   ketoconazole 2 % cream Commonly known as: NIZORAL Apply 1 application topically 2 (two) times daily  as needed for irritation.   levETIRAcetam 500 MG tablet Commonly known as: KEPPRA TAKE 1 TABLET EVERY 12 HOURS   levothyroxine 125 MCG tablet Commonly known as: SYNTHROID TAKE 1 TABLET BY MOUTH EVERY DAY BEFORE BREAKFAST   meloxicam 15 MG tablet Commonly known as: MOBIC TAKE 1 TABLET BY MOUTH EVERY DAY IN THE MORNING WITH FOOD FOR PAIN   memantine 10 MG tablet Commonly known as: NAMENDA Take 1 tablet (10 mg total) by mouth 2 (two) times daily.   NAPROSYN PO Take by mouth as needed.   traMADol 50 MG tablet Commonly known as: ULTRAM tramadol 50 mg tablet  TAKE 1 TABLET EVERY 6 HOURS AS NEEDED FOR PAIN   triamcinolone ointment 0.1 % Commonly known as: KENALOG APPLY TO THE BODY NIGHTLY (NEVER THE FACE OR GROIN)   VITAMIN D3 PO Take 500 Units by mouth  daily.   WOMENS MULTI PO Take 1 tablet by mouth daily.       Past Surgical History:  She  has a past surgical history that includes Back surgery; Anterior cruciate ligament repair (Left); Flexible bronchoscopy (09/24/2011); Tracheostomy tube placement (10/11/2011); Video assisted thoracoscopy (vats) /thorocotomy/radioactive seed implant (Left, 09/24/2011); Lumbar disc surgery; and Achilles tendon repair (Right, 01/2017).  Family History:  Her family history includes Arthritis in her mother; Diabetes in her father; Hypertension in her father.  Social History:  She  reports that she has never smoked. She has never used smokeless tobacco. She reports that she does not drink alcohol or use drugs.

## 2019-07-08 NOTE — Progress Notes (Signed)
   HPI: 49 y.o. female presenting today for follow up evaluation of right foot and ankle pain. She states the pain is relatively unchanged and reports no improvement since her last visit. She describes the pain as throbbing. She has been using a CAM boot with no significant relief. She has been taking Meloxicam and Tramadol prescribed by her PCP for treatment. Patient is here for further evaluation and treatment.   Past Medical History:  Diagnosis Date  . Dementia due to another medical condition [facility])    Trisomy 21-related, sees Sethi yearly, nl CT head 2010, did not tolerate aricept  . Depression    with OCD  . Diabetes mellitus (HCC)    "just when she was on TPN"  . DJD (degenerative joint disease) of knee    bilat knee arthritis, had 3/3 Supartz injections [name])  . Down syndrome   . 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 Eczema   . Empyema of left pleural space (HCC) 09/24/2011  . Hypothyroid   . OCD (obsessive compulsive disorder)   . OSA on CPAP 05/2012   severe OSA with AHI 118, desat nadir to 67%  . Pneumonia    multiple pneumonias  . Presenile dementia, uncomplicated (HCC)   . Seizure (HCC) 2013   x 2 ; "very mild"; during hospitalization "while coming off vent"     Physical Exam: General: The patient is alert and oriented x3 in no acute distress.  Dermatology: Skin is warm, dry and supple bilateral lower extremities. Negative for open lesions or macerations.  Vascular: Palpable pedal pulses bilaterally. No edema or erythema noted. Capillary refill within normal limits.  Neurological: Epicritic and protective threshold grossly intact bilaterally.   Musculoskeletal Exam: Pain with palpation to the peroneal tendon of the right foot. Range of motion within normal limits to all pedal and ankle joints bilateral. Muscle strength 5/5 in all groups bilateral.   Assessment: 1. H/o right peroneus brevis repair. DOS: 12/6/20218 2. Peroneal tendinitis right    Plan of Care:  1. Patient  evaluated.   2. Injection of 0.5 mLs Celestone Soluspan injected into the peroneal tendon sheath of the right foot.  3. Continue taking Meloxicam and Tramadol as directed by PCP.  4. CAM boot dispensed. Weightbearing as tolerated.  5. Return to clinic in 4 weeks.       Felecia Shelling, DPM [facility]  Dr. Felecia Shelling, DPM    [address] [place], [place] [address]                Office [phone]  Fax [phone]

## 2019-07-10 DIAGNOSIS — E662 Morbid (severe) obesity with alveolar hypoventilation: Secondary | ICD-10-CM | POA: Diagnosis not present

## 2019-07-10 DIAGNOSIS — Q909 Down syndrome, unspecified: Secondary | ICD-10-CM | POA: Diagnosis not present

## 2019-07-30 ENCOUNTER — Ambulatory Visit (INDEPENDENT_AMBULATORY_CARE_PROVIDER_SITE_OTHER): Payer: Medicare PPO | Admitting: Podiatry

## 2019-07-30 ENCOUNTER — Encounter: Payer: Self-pay | Admitting: Podiatry

## 2019-07-30 ENCOUNTER — Other Ambulatory Visit: Payer: Self-pay

## 2019-07-30 DIAGNOSIS — M7671 Peroneal tendinitis, right leg: Secondary | ICD-10-CM | POA: Diagnosis not present

## 2019-07-31 ENCOUNTER — Other Ambulatory Visit: Payer: Self-pay | Admitting: Family Medicine

## 2019-08-03 NOTE — Progress Notes (Signed)
   HPI: 49 y.o. female presenting today for follow up evaluation of right foot and ankle pain.  Patient states that she is doing much better.  She says that the ankle is better and denies pain.  She has been taking meloxicam and tramadol daily.  She also states that the injection she received last visit helped significantly.  She presents with her mom today.  Past Medical History:  Diagnosis Date  . Dementia due to another medical condition [facility])    Trisomy 21-related, sees Sethi yearly, nl CT head 2010, did not tolerate aricept  . Depression    with OCD  . Diabetes mellitus (HCC)    "just when she was on TPN"  . DJD (degenerative joint disease) of knee    bilat knee arthritis, had 3/3 Supartz injections [name])  . Down syndrome   . 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 Eczema   . Empyema of left pleural space (HCC) 09/24/2011  . Hypothyroid   . OCD (obsessive compulsive disorder)   . OSA on CPAP 05/2012   severe OSA with AHI 118, desat nadir to 67%  . Pneumonia    multiple pneumonias  . Presenile dementia, uncomplicated (HCC)   . Seizure (HCC) 2013   x 2 ; "very mild"; during hospitalization "while coming off vent"     Physical Exam: General: The patient is alert and oriented x3 in no acute distress.  Dermatology: Skin is warm, dry and supple bilateral lower extremities. Negative for open lesions or macerations.  Vascular: Palpable pedal pulses bilaterally. No edema or erythema noted. Capillary refill within normal limits.  Neurological: Epicritic and protective threshold grossly intact bilaterally.   Musculoskeletal Exam: Negative for pain with palpation to the peroneal tendon of the right foot. Range of motion within normal limits to all pedal and ankle joints bilateral. Muscle strength 5/5 in all groups bilateral.   Assessment: 1. H/o right peroneus brevis repair. DOS: 12/6/20218 2. Peroneal tendinitis right-resolved   Plan of Care:  1. Patient evaluated.   2.  Discontinue cam boot.   Recommend good supportive tennis shoes 3.  Patient may resume full activity no restrictions 4.  Continue tramadol and meloxicam as per PCP 5.  Return to clinic as needed   Felecia Shelling, DPM [facility]  Dr. Felecia Shelling, DPM    [address] [place], [place] [address]                Office [phone]  Fax [phone]

## 2019-08-08 ENCOUNTER — Ambulatory Visit: Payer: Medicare PPO

## 2019-08-08 DIAGNOSIS — H35373 Puckering of macula, bilateral: Secondary | ICD-10-CM | POA: Diagnosis not present

## 2019-08-08 DIAGNOSIS — H30893 Other chorioretinal inflammations, bilateral: Secondary | ICD-10-CM | POA: Diagnosis not present

## 2019-08-11 ENCOUNTER — Ambulatory Visit
Admission: RE | Admit: 2019-08-11 | Discharge: 2019-08-11 | Disposition: A | Discharge status: Home / Self Care | Payer: Medicare PPO | Source: Ambulatory Visit | Attending: Family Medicine | Admitting: Family Medicine

## 2019-08-11 ENCOUNTER — Other Ambulatory Visit: Payer: Self-pay

## 2019-08-11 DIAGNOSIS — Z1231 Encounter for screening mammogram for malignant neoplasm of breast: Secondary | ICD-10-CM

## 2019-08-13 LAB — HM MAMMOGRAPHY

## 2019-08-14 ENCOUNTER — Encounter: Payer: Self-pay | Admitting: Family Medicine

## 2019-09-03 ENCOUNTER — Ambulatory Visit: Payer: Medicare PPO | Admitting: Podiatry

## 2019-09-08 DIAGNOSIS — H6123 Impacted cerumen, bilateral: Secondary | ICD-10-CM | POA: Diagnosis not present

## 2019-09-08 DIAGNOSIS — H900 Conductive hearing loss, bilateral: Secondary | ICD-10-CM | POA: Diagnosis not present

## 2019-09-23 DIAGNOSIS — H5203 Hypermetropia, bilateral: Secondary | ICD-10-CM | POA: Diagnosis not present

## 2019-09-24 ENCOUNTER — Other Ambulatory Visit: Payer: Self-pay

## 2019-09-24 ENCOUNTER — Encounter: Payer: Self-pay | Admitting: Family Medicine

## 2019-09-24 ENCOUNTER — Ambulatory Visit (INDEPENDENT_AMBULATORY_CARE_PROVIDER_SITE_OTHER): Payer: Medicare PPO | Admitting: Family Medicine

## 2019-09-24 VITALS — BP 100/60 | HR 66 | Temp 97.1°F | Ht <= 58 in | Wt 241.3 lb

## 2019-09-24 DIAGNOSIS — R569 Unspecified convulsions: Secondary | ICD-10-CM

## 2019-09-24 DIAGNOSIS — R7303 Prediabetes: Secondary | ICD-10-CM | POA: Diagnosis not present

## 2019-09-24 DIAGNOSIS — M1A0620 Idiopathic chronic gout, left knee, without tophus (tophi): Secondary | ICD-10-CM | POA: Diagnosis not present

## 2019-09-24 DIAGNOSIS — E039 Hypothyroidism, unspecified: Secondary | ICD-10-CM

## 2019-09-24 DIAGNOSIS — D7589 Other specified diseases of blood and blood-forming organs: Secondary | ICD-10-CM

## 2019-09-24 DIAGNOSIS — Q909 Down syndrome, unspecified: Secondary | ICD-10-CM

## 2019-09-24 DIAGNOSIS — Z9989 Dependence on other enabling machines and devices: Secondary | ICD-10-CM

## 2019-09-24 DIAGNOSIS — F0390 Unspecified dementia without behavioral disturbance: Secondary | ICD-10-CM | POA: Diagnosis not present

## 2019-09-24 DIAGNOSIS — Z13220 Encounter for screening for lipoid disorders: Secondary | ICD-10-CM

## 2019-09-24 DIAGNOSIS — M1712 Unilateral primary osteoarthritis, left knee: Secondary | ICD-10-CM

## 2019-09-24 DIAGNOSIS — Z0000 Encounter for general adult medical examination without abnormal findings: Secondary | ICD-10-CM | POA: Diagnosis not present

## 2019-09-24 DIAGNOSIS — Z1211 Encounter for screening for malignant neoplasm of colon: Secondary | ICD-10-CM

## 2019-09-24 DIAGNOSIS — Z7189 Other specified counseling: Secondary | ICD-10-CM

## 2019-09-24 DIAGNOSIS — IMO0002 Reserved for concepts with insufficient information to code with codable children: Secondary | ICD-10-CM

## 2019-09-24 DIAGNOSIS — F3342 Major depressive disorder, recurrent, in full remission: Secondary | ICD-10-CM

## 2019-09-24 LAB — URIC ACID: Uric Acid, Serum: 5.9 mg/dL (ref 2.4–7.0)

## 2019-09-24 LAB — COMPREHENSIVE METABOLIC PANEL
ALT: 31 U/L (ref 0–35)
AST: 23 U/L (ref 0–37)
Albumin: 4.1 g/dL (ref 3.5–5.2)
Alkaline Phosphatase: 71 U/L (ref 39–117)
BUN: 18 mg/dL (ref 6–23)
CO2: 32 mEq/L (ref 19–32)
Calcium: 9.5 mg/dL (ref 8.4–10.5)
Chloride: 102 mEq/L (ref 96–112)
Creatinine, Ser: 1.16 mg/dL (ref 0.40–1.20)
GFR: 49.65 mL/min — ABNORMAL LOW (ref >60.00)
Glucose, Bld: 116 mg/dL — ABNORMAL HIGH (ref 70–99)
Potassium: 5.2 mEq/L — ABNORMAL HIGH (ref 3.5–5.1)
Sodium: 139 mEq/L (ref 135–145)
Total Bilirubin: 0.6 mg/dL (ref 0.2–1.2)
Total Protein: 7.3 g/dL (ref 6.0–8.3)

## 2019-09-24 LAB — CBC WITH DIFFERENTIAL/PLATELET
Basophils Absolute: 0.1 10*3/uL (ref 0.0–0.1)
Basophils Relative: 2 % (ref 0.0–3.0)
Eosinophils Absolute: 0.1 10*3/uL (ref 0.0–0.7)
Eosinophils Relative: 1.6 % (ref 0.0–5.0)
HCT: 44.8 % (ref 36.0–46.0)
Hemoglobin: 14.4 g/dL (ref 12.0–15.0)
Lymphocytes Relative: 19 % (ref 12.0–46.0)
Lymphs Abs: 1.3 10*3/uL (ref 0.7–4.0)
MCHC: 32.3 g/dL (ref 30.0–36.0)
MCV: 105.4 fl — ABNORMAL HIGH (ref 78.0–100.0)
Monocytes Absolute: 0.5 10*3/uL (ref 0.1–1.0)
Monocytes Relative: 8.1 % (ref 3.0–12.0)
Neutro Abs: 4.6 10*3/uL (ref 1.4–7.7)
Neutrophils Relative %: 69.3 % (ref 43.0–77.0)
Platelets: 218 10*3/uL (ref 150.0–400.0)
RBC: 4.24 Mil/uL (ref 3.87–5.11)
RDW: 16.1 % — ABNORMAL HIGH (ref 11.5–15.5)
WBC: 6.6 10*3/uL (ref 4.0–10.5)

## 2019-09-24 LAB — LIPID PANEL
Cholesterol: 179 mg/dL (ref 0–200)
HDL: 65.3 mg/dL (ref >39.00)
LDL Cholesterol: 102 mg/dL — ABNORMAL HIGH (ref 0–99)
NonHDL: 114.07
Total CHOL/HDL Ratio: 3
Triglycerides: 59 mg/dL (ref 0.0–149.0)
VLDL: 11.8 mg/dL (ref 0.0–40.0)

## 2019-09-24 LAB — TSH: TSH: 1.24 u[IU]/mL (ref 0.35–4.50)

## 2019-09-24 LAB — FOLATE: Folate: >24.8 ng/mL (ref >5.9)

## 2019-09-24 LAB — HEMOGLOBIN A1C: Hgb A1c MFr Bld: 5.7 % (ref 4.6–6.5)

## 2019-09-24 MED ORDER — TRAMADOL HCL 50 MG PO TABS: 50.0000 mg | ORAL_TABLET | Freq: Three times a day (TID) | ORAL | 0 refills | Status: DC | PRN

## 2019-09-24 NOTE — Assessment & Plan Note (Signed)
Encouraged healthy diet choices to affect sustainable weight loss. Activity markedly limited by severe L knee osteoarthritis.

## 2019-09-24 NOTE — Assessment & Plan Note (Signed)
Chronic, stable. Update levels.

## 2019-09-24 NOTE — Progress Notes (Signed)
This visit was conducted in person.  BP (!) 100/60   Pulse 66   Temp (!) 97.1 F (36.2 C) (Temporal)   Ht 4' 8.5" (1.435 m)   Wt (!) 241 lb 5 oz (109.5 kg)   SpO2 99%   BMI 53.15 kg/m    CC: AMW/CPE Subjective:    Patient ID: [name], female    DOB: [birth year], 49 y.o.   MRN: [id number]  HPI: [name] is a 49 y.o. female presenting on 09/24/2019 for Annual Exam and concerns (about weight and inflammation)   Did not see health advisor.   No exam data present    Office Visit from 09/24/2019 in [place] [facility] Total Score 0      Fall Risk  07/31/2018 08/08/2017 09/08/2016 09/08/2016 09/27/2015  Falls in the past year? 0 No No Yes No    Recent saw ENT Redmond Baseman), seeing podiatry. Pulm (Sood) follows OSA on CPAP. Sees neurology for seizures.   Concern over weight gain. Likes diet coke and drinks 1 sweet tea a day.  Does eat good fruits/vegetables.   Preventative: Colon cancer screening - discussed. Will start with iFOB. Well woman - discussed with mom.Doesn't want pap smear or pelvic exam or breast exam.  LMP 07/2019 - becoming more irregular. No hot flashes.  Mammogram 07/2019 Birads1  Flu shot - yearly  Pneumovax 2013.  COVID vaccine - completed Pfizer series 05/2019 Tdap - 01/2013.  Advanced directives: mom would be HCPOA but this has not been set up yet - encouraged they work on this.  Seat belt use discussed. Sunscreen use discussed. No changing moles. Nonsmoker  Alcohol -none Dentist - Q74moEye exam yearly  Mother hasn't noticed any trouble with memory.  Bowel - no constipation  Bladder - no incontinence   Lives with mom and dad, dogs Stays at home during the day, no longer goes to adult daycare Activity: none - activity limited by pain Diet:somewater,likes coke zero,fruits/vegetables daily- encouraged limited sugar/carbs     Relevant past medical, surgical, family and social history reviewed and updated as indicated.  Interim medical history since our last visit reviewed. Allergies and medications reviewed and updated. Outpatient Medications Prior to Visit  Medication Sig Dispense Refill  . albuterol (PROVENTIL) (2.5 MG/3ML) 0.083% nebulizer solution USE 1 VIAL VIA NEBULIZER EVERY 6 HOURS AS NEEDED FOR WHEEZING OR SHORTNESS OF BREATH 75 mL 3  . allopurinol (ZYLOPRIM) 100 MG tablet TAKE 1 TABLET BY MOUTH EVERY DAY 90 tablet 3  . Cholecalciferol (VITAMIN D3 PO) Take 500 Units by mouth daily.    . clobetasol cream (TEMOVATE) 0.05 % APPLY TO AFFECTED AREA TWICE A DAY. 2 WEEKS AT A TIME 45 g 0  . colchicine 0.6 MG tablet TAKE 1 TABLET BY MOUTH EVERY DAY AS NEEDED FOR GOUT FLARE 30 tablet 5  . ketoconazole (NIZORAL) 2 % cream Apply 1 application topically 2 (two) times daily as needed for irritation. 60 g 1  . ketoconazole (NIZORAL) 2 % shampoo     . levETIRAcetam (KEPPRA) 500 MG tablet TAKE 1 TABLET EVERY 12 HOURS 180 tablet 3  . levothyroxine (SYNTHROID) 125 MCG tablet TAKE 1 TABLET BY MOUTH EVERY DAY BEFORE BREAKFAST 90 tablet 3  . meloxicam (MOBIC) 15 MG tablet TAKE 1 TABLET BY MOUTH EVERY DAY IN THE MORNING WITH FOOD FOR PAIN    . memantine (NAMENDA) 10 MG tablet Take 1 tablet (10 mg total) by mouth 2 (two) times daily.  180 tablet 3  . Multiple Vitamins-Minerals (WOMENS MULTI PO) Take 1 tablet by mouth daily.     . Naproxen (NAPROSYN PO) Take by mouth as needed.    . triamcinolone ointment (KENALOG) 0.1 % APPLY TO THE BODY NIGHTLY (NEVER THE FACE OR GROIN)    . traMADol (ULTRAM) 50 MG tablet tramadol 50 mg tablet  TAKE 1 TABLET EVERY 6 HOURS AS NEEDED FOR PAIN    . acetaminophen (TYLENOL) 500 MG tablet Take 1 tablet (500 mg total) by mouth in the morning, at noon, and at bedtime.     No facility-administered medications prior to visit.     Per HPI unless specifically indicated in ROS section below Review of Systems  Constitutional: Negative for activity change, appetite change, chills, fatigue, fever and  unexpected weight change.  HENT: Negative for hearing loss.   Eyes: Negative for visual disturbance.  Respiratory: Negative for cough, chest tightness, shortness of breath and wheezing.   Cardiovascular: Negative for chest pain, palpitations and leg swelling.  Gastrointestinal: Negative for abdominal distention, abdominal pain, blood in stool, constipation, diarrhea, nausea and vomiting.  Genitourinary: Negative for difficulty urinating and hematuria.  Musculoskeletal: Negative for arthralgias, myalgias and neck pain.  Skin: Negative for rash.  Neurological: Negative for dizziness, seizures, syncope and headaches.  Hematological: Negative for adenopathy. Does not bruise/bleed easily.  Psychiatric/Behavioral: Negative for dysphoric mood. The patient is not nervous/anxious.    Objective:  BP (!) 100/60   Pulse 66   Temp (!) 97.1 F (36.2 C) (Temporal)   Ht 4' 8.5" (1.435 m)   Wt (!) 241 lb 5 oz (109.5 kg)   SpO2 99%   BMI 53.15 kg/m   Wt Readings from Last 3 Encounters:  09/24/19 (!) 241 lb 5 oz (109.5 kg)  07/02/19 240 lb 3.2 oz (109 kg)  10/28/18 238 lb (108 kg)      Physical Exam Vitals and nursing note reviewed.  Constitutional:      General: She is not in acute distress.    Appearance: Normal appearance. She is well-developed. She is obese. She is not ill-appearing.     Comments: Antalgic gait, needs assistance to get on exam table  HENT:     Head: Normocephalic and atraumatic.     Right Ear: Hearing, tympanic membrane, ear canal and external ear normal.     Left Ear: Hearing, tympanic membrane, ear canal and external ear normal.     Mouth/Throat:     Pharynx: Uvula midline.  Eyes:     General: No scleral icterus.    Extraocular Movements: Extraocular movements intact.     Conjunctiva/sclera: Conjunctivae normal.     Pupils: Pupils are equal, round, and reactive to light.  Cardiovascular:     Rate and Rhythm: Normal rate and regular rhythm.     Pulses: Normal  pulses.          Radial pulses are 2+ on the right side and 2+ on the left side.     Heart sounds: Normal heart sounds. No murmur heard.   Pulmonary:     Effort: Pulmonary effort is normal. No respiratory distress.     Breath sounds: Normal breath sounds. No wheezing, rhonchi or rales.  Abdominal:     General: Abdomen is flat. Bowel sounds are normal. There is no distension.     Palpations: Abdomen is soft. There is no mass.     Tenderness: There is no abdominal tenderness. There is no guarding or rebound.  Hernia: No hernia is present.  Musculoskeletal:        General: Swelling present. Normal range of motion.     Cervical back: Normal range of motion and neck supple.     Right lower leg: No edema.     Left lower leg: No edema.     Comments:  2+ DP on left Hyperpigmentation with hemosiderin deposition with chronic venous changes to LLE  Lymphadenopathy:     Cervical: No cervical adenopathy.  Skin:    General: Skin is warm and dry.     Findings: No rash.  Neurological:     General: No focal deficit present.     Mental Status: She is alert and oriented to person, place, and time.     Comments: CN grossly intact, station and gait intact  Psychiatric:        Mood and Affect: Mood normal.        Behavior: Behavior normal.        Thought Content: Thought content normal.        Judgment: Judgment normal.       Results for orders placed or performed in visit on 08/14/19  HM MAMMOGRAPHY  Result Value Ref Range   HM Mammogram 0-4 Bi-Rad 0-4 Bi-Rad, Self Reported Normal   Depression screen [facility] 2/9 09/24/2019 08/08/2017 09/08/2016 09/27/2015 07/13/2014  Decreased Interest 0 0 0 0 0  Down, Depressed, Hopeless 0 0 0 0 0  PHQ - 2 Score 0 0 0 0 0  Altered sleeping - 0 - - -  Tired, decreased energy - 0 - - -  Change in appetite - 0 - - -  Feeling bad or failure about yourself  - 0 - - -  Trouble concentrating - 0 - - -  Moving slowly or fidgety/restless - 0 - - -  Suicidal thoughts  - 0 - - -  PHQ-9 Score - 0 - - -  Difficult doing work/chores - Not difficult at all - - -  Some recent data might be hidden    Assessment & Plan:  This visit occurred during the SARS-CoV-2 public health emergency.  Safety protocols were in place, including screening questions prior to the visit, additional usage of staff PPE, and extensive cleaning of exam room while observing appropriate contact time as indicated for disinfecting solutions.   Problem List Items Addressed This Visit    Sleep-related hypoventilation   Seizure [facility])    Appreciate neurology care, continues keppra.       Relevant Orders   Comprehensive metabolic panel   CBC with Differential/Platelet   Presenile dementia, uncomplicated (HCC)    Stable period on namenda - no change in LOC needs.       Prediabetes    Update A1c.      Relevant Orders   Hemoglobin A1c   Osteoarthritis of left knee    Chronic, severe. Appreciate ortho care. Needs knee replacement but unable to do based on BMI. Will recommend scheduled tylenol 500-1028m TID for pain control, will Rx tramadol for breakthrough pain.       Relevant Medications   acetaminophen (TYLENOL) 500 MG tablet   traMADol (ULTRAM) 50 MG tablet   OSA on CPAP    Chronic, stable on CPAP - continue       Morbid obesity with BMI of 50.0-59.9, [place]    Encouraged healthy diet choices to affect sustainable weight loss. Activity markedly limited by severe L knee osteoarthritis.  Medicare annual wellness visit, subsequent - Primary    I have personally reviewed the Medicare Annual Wellness questionnaire and have noted 1. The patient's medical and social history 2. Their use of alcohol, tobacco or illicit drugs 3. Their current medications and supplements 4. The patient's functional ability including ADL's, fall risks, home safety risks and hearing or visual impairment. Cognitive function has been assessed and addressed as indicated.  5. Diet and physical  activity 6. Evidence for depression or mood disorders The patients weight, height, BMI have been recorded in the chart. I have made referrals, counseling and provided education to the patient based on review of the above and I have provided the pt with a written personalized care plan for preventive services. Provider list updated.. See scanned questionairre as needed for further documentation. Reviewed preventative protocols and updated unless pt declined.       MDD (major depressive disorder), recurrent, in full remission [facility])    H/o MDD with OCD, now in remission. Stable period off meds.       Macrocytosis without anemia    Update B12 and folate level.       Relevant Orders   CBC with Differential/Platelet   Folate   Hypothyroidism    Chronic, stable. Update levels.       Relevant Orders   TSH   Health maintenance examination    Preventative protocols reviewed and updated unless pt declined. Discussed healthy diet and lifestyle.       Down syndrome   Chronic gout    Carries this diagnosis, on 173m allopurinol daily. Unclear how diagnosed.       Relevant Orders   Uric acid   Advanced care planning/counseling discussion    Advanced directives: mom would be HCPOA but this has not been set up yet - encouraged they work on this.        Other Visit Diagnoses    Lipid screening       Relevant Orders   Lipid panel       Meds ordered this encounter  Medications  . traMADol (ULTRAM) 50 MG tablet    Sig: Take 1 tablet (50 mg total) by mouth 3 (three) times daily as needed for moderate pain.    Dispense:  30 tablet    Refill:  0   Orders Placed This Encounter  Procedures  . Fecal occult blood, imunochemical    Standing Status:   Future    Standing Expiration Date:   09/23/2020  . Lipid panel  . Comprehensive metabolic panel  . TSH  . CBC with Differential/Platelet  . Uric acid  . Hemoglobin A1c  . Folate    Patient instructions: Labs today Pass by lab  to pick up stool kit. I want you to increase water intake - healthiest beverage.  Schedule tylenol 500-10029mthree times daily with meals. Max dose is 300023may.  May use tramadol for breakthrough pain.  Return as needed or in 1 year for next physical.   Follow up plan: Return in about 1 year (around 09/23/2020) for medicare wellness visit, annual exam, prior fasting for blood work.  JavRia BushD

## 2019-09-24 NOTE — Assessment & Plan Note (Signed)
Chronic, severe. Appreciate ortho care. Needs knee replacement but unable to do based on BMI. Will recommend scheduled tylenol 500-1000mg  TID for pain control, will Rx tramadol for breakthrough pain.

## 2019-09-24 NOTE — Assessment & Plan Note (Signed)

## 2019-09-24 NOTE — Assessment & Plan Note (Signed)
Stable period on namenda - no change in LOC needs.

## 2019-09-24 NOTE — Assessment & Plan Note (Signed)
Chronic, stable on CPAP - continue

## 2019-09-24 NOTE — Assessment & Plan Note (Addendum)
H/o MDD with OCD, now in remission. Stable period off meds.

## 2019-09-24 NOTE — Assessment & Plan Note (Addendum)
Update B12 and folate level.

## 2019-09-24 NOTE — Assessment & Plan Note (Signed)
Carries this diagnosis, on 100mg  allopurinol daily. Unclear how diagnosed.

## 2019-09-24 NOTE — Assessment & Plan Note (Signed)
Advanced directives: mom would be HCPOA but this has not been set up yet - encouraged they work on this.  

## 2019-09-24 NOTE — Assessment & Plan Note (Signed)
Preventative protocols reviewed and updated unless pt declined. Discussed healthy diet and lifestyle.  

## 2019-09-24 NOTE — Assessment & Plan Note (Signed)
Appreciate neurology care, continues keppra.

## 2019-09-24 NOTE — Patient Instructions (Addendum)
Labs today Pass by lab to pick up stool kit. I want you to increase water intake - healthiest beverage.  Schedule tylenol 500-1060m three times daily with meals. Max dose is 30073mday.  May use tramadol for breakthrough pain.  Return as needed or in 1 year for next physical.   Health Maintenance, Female Adopting a healthy lifestyle and getting preventive care are important in promoting health and wellness. Ask your health care provider about:  The right schedule for you to have regular tests and exams.  Things you can do on your own to prevent diseases and keep yourself healthy. What should I know about diet, weight, and exercise? Eat a healthy diet   Eat a diet that includes plenty of vegetables, fruits, low-fat dairy products, and lean protein.  Do not eat a lot of foods that are high in solid fats, added sugars, or sodium. Maintain a healthy weight Body mass index (BMI) is used to identify weight problems. It estimates body fat based on height and weight. Your health care provider can help determine your BMI and help you achieve or maintain a healthy weight. Get regular exercise Get regular exercise. This is one of the most important things you can do for your health. Most adults should:  Exercise for at least 150 minutes each week. The exercise should increase your heart rate and make you sweat (moderate-intensity exercise).  Do strengthening exercises at least twice a week. This is in addition to the moderate-intensity exercise.  Spend less time sitting. Even light physical activity can be beneficial. Watch cholesterol and blood lipids Have your blood tested for lipids and cholesterol at 2066ears of age, then have this test every 5 years. Have your cholesterol levels checked more often if:  Your lipid or cholesterol levels are high.  You are older than 4046ears of age.  You are at high risk for heart disease. What should I know about cancer screening? Depending on your  health history and family history, you may need to have cancer screening at various ages. This may include screening for:  Breast cancer.  Cervical cancer.  Colorectal cancer.  Skin cancer.  Lung cancer. What should I know about heart disease, diabetes, and high blood pressure? Blood pressure and heart disease  High blood pressure causes heart disease and increases the risk of stroke. This is more likely to develop in people who have high blood pressure readings, are of African descent, or are overweight.  Have your blood pressure checked: ? Every 3-5 years if you are 1885931ears of age. ? Every year if you are 4072ears old or older. Diabetes Have regular diabetes screenings. This checks your fasting blood sugar level. Have the screening done:  Once every three years after age 5748f you are at a normal weight and have a low risk for diabetes.  More often and at a younger age if you are overweight or have a high risk for diabetes. What should I know about preventing infection? Hepatitis B If you have a higher risk for hepatitis B, you should be screened for this virus. Talk with your health care provider to find out if you are at risk for hepatitis B infection. Hepatitis C Testing is recommended for:  Everyone born from 1936hrough 1965.  Anyone with known risk factors for hepatitis C. Sexually transmitted infections (STIs)  Get screened for STIs, including gonorrhea and chlamydia, if: ? You are sexually active and are younger than 2439ears of age. ?  You are older than 49 years of age and your health care provider tells you that you are at risk for this type of infection. ? Your sexual activity has changed since you were last screened, and you are at increased risk for chlamydia or gonorrhea. Ask your health care provider if you are at risk.  Ask your health care provider about whether you are at high risk for HIV. Your health care provider may recommend a prescription  medicine to help prevent HIV infection. If you choose to take medicine to prevent HIV, you should first get tested for HIV. You should then be tested every 3 months for as long as you are taking the medicine. Pregnancy  If you are about to stop having your period (premenopausal) and you may become pregnant, seek counseling before you get pregnant.  Take 400 to 800 micrograms (mcg) of folic acid every day if you become pregnant.  Ask for birth control (contraception) if you want to prevent pregnancy. Osteoporosis and menopause Osteoporosis is a disease in which the bones lose minerals and strength with aging. This can result in bone fractures. If you are 51 years old or older, or if you are at risk for osteoporosis and fractures, ask your health care provider if you should:  Be screened for bone loss.  Take a calcium or vitamin D supplement to lower your risk of fractures.  Be given hormone replacement therapy (HRT) to treat symptoms of menopause. Follow these instructions at home: Lifestyle  Do not use any products that contain nicotine or tobacco, such as cigarettes, e-cigarettes, and chewing tobacco. If you need help quitting, ask your health care provider.  Do not use street drugs.  Do not share needles.  Ask your health care provider for help if you need support or information about quitting drugs. Alcohol use  Do not drink alcohol if: ? Your health care provider tells you not to drink. ? You are pregnant, may be pregnant, or are planning to become pregnant.  If you drink alcohol: ? Limit how much you use to 0-1 drink a day. ? Limit intake if you are breastfeeding.  Be aware of how much alcohol is in your drink. In the [place] one drink equals one 12 oz bottle of beer (355 mL), one 5 oz glass of wine (148 mL), or one 1 oz glass of hard liquor (44 mL). General instructions  Schedule regular health, dental, and eye exams.  Stay current with your vaccines.  Tell your health  care provider if: ? You often feel depressed. ? You have ever been abused or do not feel safe at home. Summary  Adopting a healthy lifestyle and getting preventive care are important in promoting health and wellness.  Follow your health care provider's instructions about healthy diet, exercising, and getting tested or screened for diseases.  Follow your health care provider's instructions on monitoring your cholesterol and blood pressure. This information is not intended to replace advice given to you by your health care provider. Make sure you discuss any questions you have with your health care provider. Document Revised: 02/06/2018 Document Reviewed: 02/06/2018 Elsevier Patient Education  2020 [employer].

## 2019-09-24 NOTE — Assessment & Plan Note (Signed)
Update A1c ?

## 2019-10-06 ENCOUNTER — Telehealth: Payer: Self-pay

## 2019-10-06 ENCOUNTER — Other Ambulatory Visit: Payer: Self-pay

## 2019-10-06 ENCOUNTER — Emergency Department (HOSPITAL_COMMUNITY)
Admission: EM | Admit: 2019-10-06 | Discharge: 2019-10-07 | Discharge status: Against Medical Advice | Payer: Medicare PPO | Attending: Emergency Medicine | Admitting: Emergency Medicine

## 2019-10-06 ENCOUNTER — Emergency Department (HOSPITAL_COMMUNITY): Payer: Medicare PPO

## 2019-10-06 ENCOUNTER — Encounter (HOSPITAL_COMMUNITY): Payer: Self-pay | Admitting: *Deleted

## 2019-10-06 DIAGNOSIS — R519 Headache, unspecified: Secondary | ICD-10-CM | POA: Insufficient documentation

## 2019-10-06 DIAGNOSIS — Z5321 Procedure and treatment not carried out due to patient leaving prior to being seen by health care provider: Secondary | ICD-10-CM | POA: Diagnosis not present

## 2019-10-06 DIAGNOSIS — Z20822 Contact with and (suspected) exposure to covid-19: Secondary | ICD-10-CM | POA: Insufficient documentation

## 2019-10-06 DIAGNOSIS — R0789 Other chest pain: Secondary | ICD-10-CM | POA: Diagnosis not present

## 2019-10-06 DIAGNOSIS — R05 Cough: Secondary | ICD-10-CM | POA: Diagnosis not present

## 2019-10-06 DIAGNOSIS — R0600 Dyspnea, unspecified: Secondary | ICD-10-CM | POA: Diagnosis not present

## 2019-10-06 LAB — URINALYSIS, ROUTINE W REFLEX MICROSCOPIC
Bilirubin Urine: NEGATIVE
Glucose, UA: NEGATIVE mg/dL
Hgb urine dipstick: NEGATIVE
Ketones, ur: NEGATIVE mg/dL
Leukocytes,Ua: NEGATIVE
Nitrite: NEGATIVE
Protein, ur: NEGATIVE mg/dL
Specific Gravity, Urine: 1.017 (ref 1.005–1.030)
pH: 9 — ABNORMAL HIGH (ref 5.0–8.0)

## 2019-10-06 LAB — BASIC METABOLIC PANEL
Anion gap: 10 (ref 5–15)
BUN: 12 mg/dL (ref 6–20)
CO2: 26 mmol/L (ref 22–32)
Calcium: 9 mg/dL (ref 8.9–10.3)
Chloride: 99 mmol/L (ref 98–111)
Creatinine, Ser: 0.93 mg/dL (ref 0.44–1.00)
GFR calc Af Amer: >60 mL/min (ref >60)
GFR calc non Af Amer: >60 mL/min (ref >60)
Glucose, Bld: 126 mg/dL — ABNORMAL HIGH (ref 70–99)
Potassium: 4.4 mmol/L (ref 3.5–5.1)
Sodium: 135 mmol/L (ref 135–145)

## 2019-10-06 LAB — CBC
HCT: 42.6 % (ref 36.0–46.0)
Hemoglobin: 13.7 g/dL (ref 12.0–15.0)
MCH: 34.1 pg — ABNORMAL HIGH (ref 26.0–34.0)
MCHC: 32.2 g/dL (ref 30.0–36.0)
MCV: 106 fL — ABNORMAL HIGH (ref 80.0–100.0)
Platelets: 265 10*3/uL (ref 150–400)
RBC: 4.02 MIL/uL (ref 3.87–5.11)
RDW: 14.2 % (ref 11.5–15.5)
WBC: 7.4 10*3/uL (ref 4.0–10.5)
nRBC: 0 % (ref 0.0–0.2)

## 2019-10-06 LAB — I-STAT BETA HCG BLOOD, ED (MC, WL, AP ONLY): I-stat hCG, quantitative: <5 m[IU]/mL (ref <5)

## 2019-10-06 LAB — SARS CORONAVIRUS 2 BY RT PCR (HOSPITAL ORDER, PERFORMED IN ~~LOC~~ HOSPITAL LAB): SARS Coronavirus 2: NEGATIVE

## 2019-10-06 MED ORDER — SODIUM CHLORIDE 0.9% FLUSH: 3.0000 mL | Freq: Once | INTRAVENOUS | Status: DC

## 2019-10-06 NOTE — ED Triage Notes (Signed)
Went to nursing home 3 weeks ago and they called them and told them there was a COVID breakout.  Pt is here with cough 3-4 days and just doesn't feel good with chest hurting and head hurting. No fevers. History of lung surgery, and ARDS.  Sent here to rule out covid and pna.

## 2019-10-06 NOTE — Telephone Encounter (Signed)
Noted. Currently at the ER.  plz call tomorrow for update on symptoms and for covid test result.

## 2019-10-06 NOTE — Telephone Encounter (Signed)
[facility] Day - Client TELEPHONE ADVICE RECORD AccessNurse Patient Name: [name] Gender: Female DOB: 27-Jun-1970 Age: 49 Y 24 D Return Phone Number: [phone] (Primary), [phone] (Secondary) Address: City/State/Zip: [place] [address] Client [facility] Day - Client Client Site [facility] - Day Physician Eustaquio Boyden - MD Contact Type Call Who Is Calling Patient / Member / Family / Caregiver Call Type Triage / Clinical Caller Name Blaise Palladino Relationship To Patient Other Return Phone Number [phone] (Primary) Chief Complaint CHEST PAIN (>=21 years) - pain, pressure, heaviness or tightness Reason for Call Symptomatic / Request for Health Information Initial Comment Robin scheduler with Corinda Gubler states the patient hurts in her chest and cough with no nasal drainage. Caller states the patient has special needs and was at a facility were she may have been exposed to Covid-19. Caller states the patient has a headache and feels light headed. GOTO Facility Not Listed [place] Translation No Nurse Assessment Nurse: Anner Crete, RN, Olegario Messier Date/Time (Eastern Time): 10/06/2019 2:01:45 PM Confirm and document reason for call. If symptomatic, describe symptoms. ---Caller stated her daughter is special needs. She was fully vaccinated but may have been exposed to COVID by a staff member at a nursing home they were visiting. Her daughter complains of chest pain , cough, headache and feeling light headed. Has the patient had close contact with a person known or suspected to have the novel coronavirus illness OR traveled / lives in area with major community spread (including international travel) in the last 14 days from the onset of symptoms? * If Asymptomatic, screen for exposure and travel within the last 14 days. ---Yes Does the patient have any new or worsening symptoms? ---Yes Will a triage be completed?  ---Yes Related visit to physician within the last 2 weeks? ---No Does the PT have any chronic conditions? (i.e. diabetes, asthma, this includes High risk factors for pregnancy, etc.) ---Yes List chronic conditions. ---[name] Is the patient pregnant or possibly pregnant? (Ask all females between the ages of 55-55) ---No Is this a behavioral health or substance abuse call? ---No PLEASE NOTE: All timestamps contained within this report are represented as [place] Standard Time. CONFIDENTIALTY NOTICE: This fax transmission is intended only for the addressee. It contains information that is legally privileged, confidential or otherwise protected from use or disclosure. If you are not the intended recipient, you are strictly prohibited from reviewing, disclosing, copying using or disseminating any of this information or taking any action in reliance on or regarding this information. If you have received this fax in error, please notify us immediately by telephone so that we can arrange for its return to [place]. Phone: [phone], Toll-Free: [phone], Fax: [phone] Page: 2 of 2 Call Id: [id number] Nurse Assessment Guidelines Guideline Title Affirmed Question Affirmed Notes Nurse Date/Time Lamount Cohen Time) COVID-19 - Diagnosed or Suspected SEVERE or constant chest pain or pressure (Exception: mild central chest pain, present only when coughing) Anner Crete, RN, Olegario Messier 10/06/2019 2:02:22 PM Disp. Time Lamount Cohen Time) Disposition Final User 10/06/2019 1:57:40 PM Send to Urgent Queue [facility] Geryl Councilman 10/06/2019 2:07:45 PM Go to ED Now Yes Anner Crete, RN, Sammuel Bailiff Disagree/Comply Comply Caller Understands Yes PreDisposition Call Doctor Care Advice Given Per Guideline * You need to be seen in the Emergency Department. GO TO ED NOW: WEAR A MASK - COVER YOUR MOUTH AND NOSE: * Wear a mask. * Tell the first healthcare worker you meet that you may have COVID-19. YOU  SHOULD TELL  HEALTHCARE PERSONNEL THAT YOU MIGHT HAVE COVID-19: ANOTHER ADULT SHOULD DRIVE: * It is better and safer if another adult drives instead of you. CARE ADVICE given per COVID-19 - DIAGNOSED OR SUSPECTED (Adult) guideline. * Tell them you have symptoms and have been sent for COVID-19 testing. Referrals GO TO FACILITY OTHER - SPECIFY

## 2019-10-07 ENCOUNTER — Telehealth: Payer: Self-pay

## 2019-10-07 NOTE — Telephone Encounter (Signed)
See TE, 10/07/19.

## 2019-10-07 NOTE — Telephone Encounter (Signed)
Lvm asking pt's mom, [name] (on dpr), asking her to call back.  Need an update on pt.

## 2019-10-07 NOTE — Telephone Encounter (Signed)
Noted they left ER.  plz call for update on symptoms. If ongoing respiratory symptoms would suggest UCC eval instead of ER eval given recent reassuring CXR and neg covid swab.

## 2019-10-07 NOTE — Telephone Encounter (Signed)
Spoke with pt's mom, [name] (on dpr), asking for an update.  States she brought pt home but she was still coughing an gagging.  Mom gave her some Mucinex DM, had her take a shower and pt is now resting.  I relayed Dr. Timoteo Expose message.  Mom verbalizes understanding and states if pt is still coughing when she wakes, she will give her a neb tx.  If that does not work, then they will go to UC.

## 2019-10-07 NOTE — Telephone Encounter (Signed)
[facility] Night - Client Nonclinical Telephone Record AccessNurse Client [facility] Night - Client Client Site [facility] - Night Contact Type Call Who Is Calling Patient / Member / Family / Caregiver Caller Name [name] Caller Phone Number [phone] Patient Name Marriah Sanderlin Patient DOB 01-29-1971 Call Type Message Only Information Provided Reason for Call Request for Lab/Test Results Initial Comment Caller states she is calling to get her daughters results read by the DR, they are currently at the ER and it is a long wait to get back to see a DR and the ER told her that her PCP will be able to read the results. Additional Comment Office hours provided. Caller denied triage. Disp. Time Disposition Final User 10/07/2019 7:05:08 AM General Information Provided Yes Emeline Gins Call Closed By: Emeline Gins Transaction Date/Time: 10/07/2019 7:01:29 AM (ET)

## 2019-10-07 NOTE — ED Notes (Signed)
Patient stated that her doctors office reviewed her x-ray and stated that it looked good so she decided to leave

## 2019-10-07 NOTE — Telephone Encounter (Signed)
Carollee Herter CMA got call from pts mom and Carollee Herter CMA spoke with Dr Reece Agar; per Dr Reece Agar labs were [facility] was normal  and covid test was negative per Dr Reece Agar. Carollee Herter CMA advised pts mom and was up to them what they would do and [facility] CMA said that pt was going to leave ED. FYI to Dr Reece Agar.

## 2019-10-09 DIAGNOSIS — Q909 Down syndrome, unspecified: Secondary | ICD-10-CM | POA: Diagnosis not present

## 2019-10-09 DIAGNOSIS — E662 Morbid (severe) obesity with alveolar hypoventilation: Secondary | ICD-10-CM | POA: Diagnosis not present

## 2019-11-02 ENCOUNTER — Other Ambulatory Visit: Payer: Self-pay | Admitting: Family Medicine

## 2019-11-05 NOTE — Telephone Encounter (Signed)
Clobetasol cream Last filled:  07/31/19, #45 g Last OV:  09/24/19, AWV Next OV:  none

## 2019-12-10 ENCOUNTER — Ambulatory Visit: Payer: Medicare PPO | Admitting: Podiatry

## 2019-12-16 ENCOUNTER — Other Ambulatory Visit: Payer: Self-pay

## 2019-12-16 ENCOUNTER — Encounter: Payer: Self-pay | Admitting: Podiatrist

## 2019-12-16 ENCOUNTER — Ambulatory Visit (INDEPENDENT_AMBULATORY_CARE_PROVIDER_SITE_OTHER): Payer: Medicare PPO | Admitting: Podiatrist

## 2019-12-16 DIAGNOSIS — B351 Tinea unguium: Secondary | ICD-10-CM

## 2019-12-16 DIAGNOSIS — M79676 Pain in unspecified toe(s): Secondary | ICD-10-CM

## 2019-12-16 NOTE — Progress Notes (Signed)
  Chief Complaint  Patient presents with  . Nail Problem    foot care needed nail trim      HPI: Patient is 49 y.o. female who presents today for long and painful toenails of both feet. She presents today with her father and states her mother has trimmed her nails in the past.  She was recently seen her by Dr. Logan Bores for her final follow up from surgery on her right peroneus brevis repair and she states her foot is well.  She denies any numbness or sharp or shooting pain to her feet.   Patient Active Problem List   Diagnosis Date Noted  . Pelvic pain in female 10/28/2018  . Osteoarthritis of left knee 09/24/2017  . Lower back pain 01/08/2017  . Epiretinal membrane (ERM) of both eyes 09/29/2016  . Dry scalp 09/08/2016  . Conductive hearing loss, bilateral 10/19/2015  . Macrocytosis without anemia 10/02/2015  . Advanced care planning/counseling discussion 09/27/2015  . Hearing loss due to cerumen impaction 09/27/2015  . Granulomatous chorioretinitis of both eyes 09/20/2015  . Nail abnormality 10/02/2014  . Recurrent boils 09/25/2014  . Health maintenance examination 07/13/2014  . Dyshidrotic eczema 09/05/2013  . Medicare annual wellness visit, subsequent 02/13/2013  . Partial epilepsy with impairment of consciousness (HCC) 11/29/2012  . Presenile dementia, uncomplicated (HCC) 11/29/2012  . Sleep-related hypoventilation 09/23/2012  . Right ankle pain 02/14/2012  . Cough 12/05/2011  . OSA on CPAP 11/15/2011  . Morbid obesity with BMI of 50.0-59.9, adult (HCC) 11/15/2011  . Chronic gout   . Hypothyroidism   . Seizure (HCC)   . Prediabetes   . MDD (major depressive disorder), recurrent, in full remission (HCC)   . Down syndrome      No Known Allergies  Review of Systems No fevers, chills, nausea, muscle aches, no difficulty breathing, no calf pain, no chest pain or shortness of breath.   Physical Exam  GENERAL APPEARANCE: Alert, conversant. Appropriately groomed. No acute  distress.   VASCULAR: Pedal pulses palpable DP and PT bilateral.  Capillary refill time is immediate to all digits,  Proximal to distal cooling it warm to warm.  Digital perfusion adequate. Bilateral edema is noted to feet and lower legs  NEUROLOGIC: sensation is intact to 5.07 monofilament at 5/5 sites bilateral.  Light touch is intact bilateral, vibratory sensation intact bilateral  MUSCULOSKELETAL: acceptable muscle strength, tone and stability bilateral.  No gross boney pedal deformities noted.  No pain, crepitus or limitation noted with foot and ankle range of motion bilateral.   DERMATOLOGIC: skin is warm, supple, and dry- turgor is decreased bilateral.  No open lesions noted.  No rash, no pre ulcerative lesions. Digital nails are long, thickened, discolored, friable and brittle x 10.  Pain with direct pressure and palpation noted.     Assessment     ICD-10-CM   1. Pain due to onychomycosis of toenail  B35.1    M79.676      Plan  Debridement of toenails was recommended.  Onychoreduction of symptomatic toenails was performed via nail nipper and power burr without iatrogenic incident.  Patient was instructed on signs and symptoms of infection and was told to call immediately should any of these arise.  A return appointment will be made for 3 months for continued care.

## 2019-12-31 DIAGNOSIS — M25762 Osteophyte, left knee: Secondary | ICD-10-CM | POA: Diagnosis not present

## 2019-12-31 DIAGNOSIS — M25561 Pain in right knee: Secondary | ICD-10-CM | POA: Diagnosis not present

## 2019-12-31 DIAGNOSIS — M1711 Unilateral primary osteoarthritis, right knee: Secondary | ICD-10-CM | POA: Diagnosis not present

## 2019-12-31 DIAGNOSIS — M25761 Osteophyte, right knee: Secondary | ICD-10-CM | POA: Diagnosis not present

## 2019-12-31 DIAGNOSIS — M1712 Unilateral primary osteoarthritis, left knee: Secondary | ICD-10-CM | POA: Diagnosis not present

## 2019-12-31 DIAGNOSIS — M25562 Pain in left knee: Secondary | ICD-10-CM | POA: Diagnosis not present

## 2019-12-31 DIAGNOSIS — R2689 Other abnormalities of gait and mobility: Secondary | ICD-10-CM | POA: Diagnosis not present

## 2020-01-06 DIAGNOSIS — M1712 Unilateral primary osteoarthritis, left knee: Secondary | ICD-10-CM | POA: Diagnosis not present

## 2020-01-09 DIAGNOSIS — Q909 Down syndrome, unspecified: Secondary | ICD-10-CM | POA: Diagnosis not present

## 2020-01-09 DIAGNOSIS — E662 Morbid (severe) obesity with alveolar hypoventilation: Secondary | ICD-10-CM | POA: Diagnosis not present

## 2020-01-15 DIAGNOSIS — M1712 Unilateral primary osteoarthritis, left knee: Secondary | ICD-10-CM | POA: Diagnosis not present

## 2020-01-20 DIAGNOSIS — M1712 Unilateral primary osteoarthritis, left knee: Secondary | ICD-10-CM | POA: Diagnosis not present

## 2020-01-27 DIAGNOSIS — M1712 Unilateral primary osteoarthritis, left knee: Secondary | ICD-10-CM | POA: Diagnosis not present

## 2020-02-01 ENCOUNTER — Other Ambulatory Visit: Payer: Self-pay | Admitting: Family Medicine

## 2020-02-03 NOTE — Telephone Encounter (Signed)
Pharmacy requests refill on: Clobetasol 0.05 % cream   LAST REFILL: 11/05/2019 LAST OV: 09/24/2019 NEXT OV: Not Scheduled  PHARMACY: [id number] [place], [place]

## 2020-02-04 DIAGNOSIS — M1712 Unilateral primary osteoarthritis, left knee: Secondary | ICD-10-CM | POA: Diagnosis not present

## 2020-02-19 ENCOUNTER — Other Ambulatory Visit: Payer: Self-pay | Admitting: Adult Health

## 2020-03-03 ENCOUNTER — Ambulatory Visit: Payer: Medicare PPO | Admitting: Adult Health

## 2020-03-17 ENCOUNTER — Telehealth: Payer: Self-pay | Admitting: *Deleted

## 2020-03-17 ENCOUNTER — Ambulatory Visit (INDEPENDENT_AMBULATORY_CARE_PROVIDER_SITE_OTHER): Payer: Medicare PPO

## 2020-03-17 ENCOUNTER — Ambulatory Visit (INDEPENDENT_AMBULATORY_CARE_PROVIDER_SITE_OTHER): Payer: Medicare PPO | Admitting: Podiatry

## 2020-03-17 ENCOUNTER — Other Ambulatory Visit: Payer: Self-pay | Admitting: Podiatry

## 2020-03-17 ENCOUNTER — Other Ambulatory Visit: Payer: Self-pay

## 2020-03-17 DIAGNOSIS — M7751 Other enthesopathy of right foot: Secondary | ICD-10-CM

## 2020-03-17 DIAGNOSIS — M19071 Primary osteoarthritis, right ankle and foot: Secondary | ICD-10-CM

## 2020-03-17 DIAGNOSIS — M19079 Primary osteoarthritis, unspecified ankle and foot: Secondary | ICD-10-CM | POA: Insufficient documentation

## 2020-03-17 DIAGNOSIS — M7750 Other enthesopathy of unspecified foot: Secondary | ICD-10-CM

## 2020-03-17 DIAGNOSIS — M65271 Calcific tendinitis, right ankle and foot: Secondary | ICD-10-CM

## 2020-03-17 MED ORDER — METHYLPREDNISOLONE 4 MG PO TBPK
ORAL_TABLET | ORAL | 0 refills | Status: DC
Start: 2020-03-17 — End: 2020-10-21

## 2020-03-17 MED ORDER — MELOXICAM 15 MG PO TABS: ORAL_TABLET | ORAL | 0 refills | Status: DC

## 2020-03-17 NOTE — Progress Notes (Signed)
This patient presents the office with chief complaint of a painful swollen right ankle.  This patient presents to the office with her mother since she has Down syndrome.  She says that over the last week that her daughter is unable to bear weight on her right ankle and has been laying in bed crying due to the severity of the pain.  The patient denies any history of trauma or injury to the right ankle.  She does have a history of having peroneal brevis surgery previously which has healed.  She points to the inside of her right ankle as the site of her significant pain.  She has provided no self treatment nor sought any professional help.  She presents the office today for an evaluation and treatment of her right ankle.  Patient does have a previous history of gout which her mother says was present in her right knee.    General Appearance  Alert, conversant and in no acute stress.  Vascular  Dorsalis pedis and posterior tibial  pulses are palpable  bilaterally.  Capillary return is within normal limits  bilaterally. Temperature is within normal limits  bilaterally.  Neurologic  Senn-Weinstein monofilament wire test within normal limits  bilaterally. Muscle power within normal limits bilaterally.  Nails Thick disfigured discolored nails with subungual debris  from hallux to fifth toes bilaterally. No evidence of bacterial infection or drainage bilaterally.  Orthopedic  No limitations of motion  feet .  No crepitus or effusions noted.  No bony pathology or digital deformities noted.  Patient has significant swelling over the medial malleolus right foot.  She has palpable pain at the medial malleous, at talar navicular joint and along her tendon behind the medial malleolus.  Patient has muscle strength noted to the peroneal tendons and the medial tendons right ankle.    Skin  normotropic skin with no porokeratosis noted bilaterally.  No signs of infections or ulcers noted.    Right Ankle arthritis  Right  ankle tendinitis (PTT)  ROV.  Ankle x-rays and foot x-rays were taken for this patient.  There is definite joint narrowing at the talonavicular joint right foot.  There also appears to be bony changes noted at the dorsal aspect of the talar head and the inside of the medial malleolus.  Discussed this condition with this patient and her mother.  Told them the x-rays show arthritic changes noted on the inside aspect of her right ankle.  Upon returning from her x-rays she has not a propulsive gait with a pes planus foot type bilaterally.  Patient was not interested in receiving an injection at the site of pain today.  I therefore prescribed a Medrol Dosepak for her to take until completed and follow-up with Mobic 15 mg tablet daily until pain resolves.  She was also told to wear the cam walker which her mother says she has at home until this condition improves.  She will return to the office in 2 weeks for further evaluation of her right ankle and preventative foot care services for her nails.  She is to call the office if the problem persists.   Helane Gunther DPM

## 2020-03-17 NOTE — Addendum Note (Signed)
Addended by: Helane Gunther on: 03/17/2020 03:15 PM   Modules accepted: Level of Service

## 2020-03-17 NOTE — Addendum Note (Signed)
Addended byMaury Dus, Carizma Dunsworth L on: 03/17/2020 02:25 PM   Modules accepted: Orders

## 2020-03-17 NOTE — Addendum Note (Signed)
Addended byMaury Dus, Juliocesar Blasius L on: 03/17/2020 02:49 PM   Modules accepted: Orders

## 2020-03-17 NOTE — Telephone Encounter (Signed)
Called and spoke with patient's mother and informed that a prescription for Mobic-15mg , medro dose pack has been sent into pharmacy on file. She was instructed per Dr Stacie Acres to take the [facility] dose pk first ,then Mobic and to discontinue the Naproxen, verbalized understanding.

## 2020-03-31 ENCOUNTER — Encounter: Payer: Self-pay | Admitting: Podiatry

## 2020-03-31 ENCOUNTER — Ambulatory Visit (INDEPENDENT_AMBULATORY_CARE_PROVIDER_SITE_OTHER): Payer: Medicare PPO | Admitting: Podiatry

## 2020-03-31 ENCOUNTER — Other Ambulatory Visit: Payer: Self-pay

## 2020-03-31 DIAGNOSIS — M19079 Primary osteoarthritis, unspecified ankle and foot: Secondary | ICD-10-CM

## 2020-03-31 DIAGNOSIS — M7750 Other enthesopathy of unspecified foot: Secondary | ICD-10-CM

## 2020-03-31 NOTE — Progress Notes (Addendum)
This patient returns to the office follow-up for diagnosis of tendinitis/arthritis right ankle.  Patient was seen 2 weeks ago and treated with a Medrol Dosepak followed by Mobic.  She was also told to ambulate with a cam walker.  She presents the office today with her mother.  Her mother states that she is 75% improved and is pleased with her progress.  She says the swelling and the pain hasn't diminished and she is now able to bear weight on her right leg/foot.  She presents the office today for continued evaluation and treatment of her right ankle.    Vascular  Dorsalis pedis and posterior tibial pulses are palpable  B/L.  Capillary return  WNL.  Temperature gradient is  WNL.  Skin turgor  WNL  Sensorium  Senn Weinstein monofilament wire  WNL. Normal tactile sensation.  Nail Exam  Patient has thick, disfigured and discolored nails both nails.    Orthopedic  Exam  Muscle tone and muscle strength  WNL.  No limitations of motion feet  B/L.  No crepitus or joint effusion noted.  Foot type is unremarkable and digits show no abnormalities.  Bony prominences are unremarkable.Decreased redness and swelling and pain right ankle.  Patient has normal ankle ROM with minimal pain.  Skin  No open lesions.  Normal skin texture and turgor.  Ankle arthritis/tendinitis right ankle  ROV.  Discussed this condition with this patient and mother.  We decided to allow her to continue to walk in the cam walker and take the Mobic.  In 3 weeks if the problem has not resolved she is to make an appointment with one of the podiatric doctors treating ankles.   Return to the clinic as needed for ankle pain.  Return to the clinic in 3 months for nail care.   Helane Gunther DPM

## 2020-04-09 DIAGNOSIS — H35373 Puckering of macula, bilateral: Secondary | ICD-10-CM | POA: Diagnosis not present

## 2020-04-09 DIAGNOSIS — H2513 Age-related nuclear cataract, bilateral: Secondary | ICD-10-CM | POA: Diagnosis not present

## 2020-04-09 DIAGNOSIS — H33193 Other retinoschisis and retinal cysts, bilateral: Secondary | ICD-10-CM | POA: Diagnosis not present

## 2020-04-09 DIAGNOSIS — H30893 Other chorioretinal inflammations, bilateral: Secondary | ICD-10-CM | POA: Diagnosis not present

## 2020-04-13 ENCOUNTER — Other Ambulatory Visit: Payer: Self-pay | Admitting: Podiatry

## 2020-04-13 NOTE — Telephone Encounter (Signed)
Please advise?  Last office visit 0202/[id number]

## 2020-04-14 ENCOUNTER — Other Ambulatory Visit: Payer: Self-pay | Admitting: *Deleted

## 2020-04-14 MED ORDER — MELOXICAM 15 MG PO TABS: ORAL_TABLET | ORAL | 0 refills | Status: DC

## 2020-05-10 ENCOUNTER — Other Ambulatory Visit: Payer: Self-pay | Admitting: Adult Health

## 2020-05-16 ENCOUNTER — Other Ambulatory Visit: Payer: Self-pay | Admitting: Adult Health

## 2020-06-08 ENCOUNTER — Other Ambulatory Visit: Payer: Self-pay | Admitting: Adult Health

## 2020-06-09 ENCOUNTER — Telehealth: Payer: Self-pay | Admitting: Adult Health

## 2020-06-09 ENCOUNTER — Other Ambulatory Visit: Payer: Self-pay | Admitting: Podiatry

## 2020-06-09 NOTE — Telephone Encounter (Signed)
Requesting cb to r/s 4/14 appointment Shanda Bumps out of office).

## 2020-06-09 NOTE — Telephone Encounter (Signed)
Please advise 

## 2020-06-10 ENCOUNTER — Ambulatory Visit: Payer: Medicare PPO | Admitting: Adult Health

## 2020-06-10 ENCOUNTER — Other Ambulatory Visit: Payer: Self-pay

## 2020-06-30 ENCOUNTER — Encounter: Payer: Self-pay | Admitting: Podiatry

## 2020-06-30 ENCOUNTER — Ambulatory Visit (INDEPENDENT_AMBULATORY_CARE_PROVIDER_SITE_OTHER): Payer: Medicare PPO | Admitting: Podiatry

## 2020-06-30 ENCOUNTER — Other Ambulatory Visit: Payer: Self-pay

## 2020-06-30 DIAGNOSIS — M19079 Primary osteoarthritis, unspecified ankle and foot: Secondary | ICD-10-CM

## 2020-06-30 DIAGNOSIS — B351 Tinea unguium: Secondary | ICD-10-CM | POA: Diagnosis not present

## 2020-06-30 DIAGNOSIS — M79676 Pain in unspecified toe(s): Secondary | ICD-10-CM

## 2020-06-30 NOTE — Progress Notes (Signed)
This patient returns to my office for at risk foot care.  This patient requires this care by a professional since this patient will be at risk due to having diabetes and Down Syndrome.  This patient is unable to cut nails herself since the patient cannot reach her nails.These nails are painful walking and wearing shoes.  She presents wearing a cam walker due to pain right foot.  She is accompanied by her father.  He says her foot has not been sore and only today has started wearing her cam walker.   This patient presents for at risk foot care today.  General Appearance  Alert, conversant and in no acute stress.  Vascular  Dorsalis pedis and posterior tibial  pulses are palpable  bilaterally.  Capillary return is within normal limits  bilaterally. Temperature is within normal limits  bilaterally.  Neurologic  Senn-Weinstein monofilament wire test within normal limits  bilaterally. Muscle power within normal limits bilaterally.  Nails Thick disfigured discolored nails with subungual debris  from hallux to fifth toes bilaterally. No evidence of bacterial infection or drainage bilaterally.  Orthopedic  No limitations of motion  feet .  No crepitus or effusions noted.  No bony pathology or digital deformities noted. Swelling persists right foot.  Skin  normotropic skin with no porokeratosis noted bilaterally.  No signs of infections or ulcers noted.     Onychomycosis  Pain in right toes  Pain in left toes  Consent was obtained for treatment procedures.   Mechanical debridement of nails 1-5  bilaterally performed with a nail nipper.  Filed with dremel without incident. Told them to allow her to wear the cam walker.  If pain persists she needs to make an appointment with medical podiatrists.   Return office visit  3 months                    Told patient to return for periodic foot care and evaluation due to potential at risk complications.   Helane Gunther DPM

## 2020-07-01 ENCOUNTER — Ambulatory Visit (INDEPENDENT_AMBULATORY_CARE_PROVIDER_SITE_OTHER): Payer: Medicare PPO | Admitting: Adult Health

## 2020-07-01 ENCOUNTER — Encounter: Payer: Self-pay | Admitting: Adult Health

## 2020-07-01 VITALS — BP 114/68 | HR 83 | Ht <= 58 in | Wt 245.0 lb

## 2020-07-01 DIAGNOSIS — Q909 Down syndrome, unspecified: Secondary | ICD-10-CM

## 2020-07-01 DIAGNOSIS — F0280 Dementia in other diseases classified elsewhere without behavioral disturbance: Secondary | ICD-10-CM

## 2020-07-01 DIAGNOSIS — R569 Unspecified convulsions: Secondary | ICD-10-CM | POA: Diagnosis not present

## 2020-07-01 DIAGNOSIS — E662 Morbid (severe) obesity with alveolar hypoventilation: Secondary | ICD-10-CM | POA: Diagnosis not present

## 2020-07-01 MED ORDER — MEMANTINE HCL 10 MG PO TABS: 10.0000 mg | ORAL_TABLET | Freq: Two times a day (BID) | ORAL | 3 refills | Status: DC

## 2020-07-01 MED ORDER — SERTRALINE HCL 25 MG PO TABS: 25.0000 mg | ORAL_TABLET | Freq: Every day | ORAL | 5 refills | Status: DC

## 2020-07-01 MED ORDER — LEVETIRACETAM 500 MG PO TABS: 500.0000 mg | ORAL_TABLET | Freq: Two times a day (BID) | ORAL | 3 refills | Status: DC

## 2020-07-01 NOTE — Progress Notes (Signed)
[facility]  PATIENT: [name] DOB: 1971-02-07   REASON FOR VISIT: Follow-up for seizure disorder, [name] and memory loss HISTORY FROM: Patient and mom   Chief Complaint  Patient presents with  . Follow-up    R with mother ([name]) Pt is well and stable, no seizures. Mom states she has noticed behavioral pattern changes.       HISTORY OF PRESENT ILLNESS:  Today, 07/01/2020, [name] returns for yearly follow-up with history of seizure disorder and cognitive impairment with history of Down syndrome.  She is accompanied by her mother, [name]  Stable from a seizure standpoint without seizure activity and remains on Keppra 500 mg twice daily without side effects.   Cognition has been gradually declining and over the past year increased behavioral concerns including agitation, outbursts, sundowning, sleep-wake disturbance and slower movements.  She has remained on Namenda 10 mg twice daily tolerating without side effects.  Possible depression as mother will find her upset and crying occasionally.  She continues to live with her mother who is her full-time caregiver.  No new concerns at this time    History provided for reference purposes only Update 03/03/2019 JM: [name] is a 50 year old female who is being seen today via virtual visit for 1 year follow-up with history of seizure disorder, Down syndrome and memory loss.  She remains on Keppra 500 mg twice daily tolerating well without recent seizure activity.  Continues on Namenda 10 mg twice daily and memory has been stable.  She also has a history of OSA and continues use of CPAP with ongoing follow-up by Dr. Craige CottaSood.  She is currently prescribed multiple as needed pain medications such as tramadol, naproxen and meloxicam due to ongoing knee pain unable to undergo knee replacement due to being overweight.  Continues to attempt weight loss but due to limited mobility, this has been difficult.  No further  concerns at this time.  UPDATE 12/30/2019CM [name], [age] year old female returns for follow-up with history of seizure disorder Down syndrome and memory loss.  She also has a history of obstructive sleep apnea but her CPAP is followed by Dr. Craige CottaSood.  She remains on Keppra 500 twice daily without further seizure activity.  She is on Namenda 10 mg twice daily and her memory is stable according to the mother.  She is having problems with her left knee and needs to have knee replacement however she needs to lose weight prior to the surgery according to mom.  She no longer goes to adult daycare but helps her mom around the house.  No behavior issues..  She returns for reevaluation  UPDATE 12/26/2018CM [name], 50 year old female returns for follow-up with a history of Down syndrome and cognitive decline over several years.  She also has a seizure disorder which is currently well controlled on Keppra 500 mg twice daily.  She remains on Namenda 10 mg twice daily.  She gets little exercise and is overweight.  She continues to go to adult care 5 days a week.  She has a history of obstructive sleep apnea and is on CPAP with additional oxygen at night.  No recent behavior issues.  She has a history of gout.  She had recent ankle surgery for a torn tendon and is wearing a boot on the right foot.  No recent behavior issues.  Returns for reevaluation  UPDATE 12/15/2015 CM [name], 50 year old female returns for followup with her mother. She has a history of Jeral PinchDowns  syndrome and  cognitive decline over several years as well as seizure disorder. She is currently well controlled on Keppra and she is on Namenda without side effects to either drug. She is overweight, gets little exercise, lives at home. She goes to adult daycare 5 days a week . She has been having a lot of abdominal pain and diarrhea, she has appointment with GI in December.  She occasionally has behavior problems and was recently placed on Zoloft however it  made her irritability worse and she is slowly titrating off this. She needs refills on Keppra and Namenda . She returns for reevaluation Update 10/14/16PS : She returns for follow-up after last visit a year ago. She is a complaint by sister who provides most of the history. She continues to do well and has not had any breakthrough seizures now for a few years. Her memory and cognitive difficulties remain unchanged. She requires close supervision at home and does spend the day at the daycare center. She can feed dress herself and bathe herself. She is never left alone. There have been no issues with agitation, behavior, delusions or hallucinations or safety concerns. Her gait imbalance remained stable and she has had no falls.  HISTORY: [name] is a 60 year year old Caucasian lady with lifelong history of Down`s syndrome and cognitive decline in last several years from dementia who is referred back to see need for a new problem of seizures. She had recent prolonged hospital stay [facility] with empyema and ventilator dependent respiratory failure. She underwent video assisted thoracotomy for empyma drainage. She required prolonged antibiotics and was eventually required tracheostomy. She had 2 brief episodes of what sound like complex partial seizures one week apart in September 2013. Her mother who is present today was eyewitness to both episodes. She was described as staring and unresponsive not following commands with a distant look on her face lasting a few minutes followed by a period of disorientation and tiredness. In the second episode she had some clonic activity in both her upper extremities. She was started on Keppra 500 twice a day falling and abnormal EEG on 11/04/2011 and with showed sharp waves in the left occipital parietal region. She was initially off her Namenda and Prozac in the hospital stay and had some behavioral agitation but  settled down after the medicines were restarted. She has done well since discharge and has not had any more seizures. She seems to be tolerating Keppra quite well without any side effects. She is finished a course of antibiotics and home physical and occupation therapy. She is back living at home with mom and plans to start attending adult daycare soon. She apparently did not have any brain imaging study done.  03/11/12: Ms Manville returns for followup. She was last seen by Dr. [name] 12/11/11: She had prolonged hospitilization in Sept 2013 and had seizure events. She was started on Keppra at that time. No further seizure events. MRI of the brain 12/18/11 was normal. EEG was abnormal suggestive of mild focal irritability in right frontal and temporal regions but no definite epileptiform  activity is noted. ROS neg.    REVIEW OF SYSTEMS: Full 14 system review of systems performed and notable only for those listed, all others are neg:  Constitutional: neg  Cardiovascular: neg Ear/Nose/Throat: neg  Skin: neg Eyes: neg Respiratory: neg Gastroitestinal: neg Hematology/Lymphatic: neg  Endocrine: neg Musculoskeletal: Right foot pain Allergy/Immunology: neg Neurological: History of seizure disorder,  Down syndrome, cognitive impairment with behaviors Psychiatric: neg Sleep : neg   ALLERGIES: No Known Allergies  HOME MEDICATIONS: Outpatient Medications Prior to Visit  Medication Sig Dispense Refill  . acetaminophen (TYLENOL) 500 MG tablet Take 1 tablet (500 mg total) by mouth in the morning, at noon, and at bedtime.    Marland Kitchen albuterol (PROVENTIL) (2.5 MG/3ML) 0.083% nebulizer solution USE 1 VIAL VIA NEBULIZER EVERY 6 HOURS AS NEEDED FOR WHEEZING OR SHORTNESS OF BREATH 75 mL 3  . allopurinol (ZYLOPRIM) 100 MG tablet TAKE 1 TABLET BY MOUTH EVERY DAY 90 tablet 3  . Cholecalciferol (VITAMIN D3 PO) Take 500 Units by mouth daily.    . clobetasol cream (TEMOVATE) 0.05 % APPLY TO AFFECTED AREA TWICE A DAY. (2  WEEKS AT A TIME) 45 g 0  . colchicine 0.6 MG tablet TAKE 1 TABLET BY MOUTH EVERY DAY AS NEEDED FOR GOUT FLARE 30 tablet 5  . ketoconazole (NIZORAL) 2 % cream Apply 1 application topically 2 (two) times daily as needed for irritation. 60 g 1  . ketoconazole (NIZORAL) 2 % shampoo     . levETIRAcetam (KEPPRA) 500 MG tablet TAKE 1 TABLET BY MOUTH EVERY 12 HOURS. MUST KEEP FOLLOW UP 06/10/20 FOR FUTURE REFILLS 60 tablet 0  . levothyroxine (SYNTHROID) 125 MCG tablet TAKE 1 TABLET BY MOUTH EVERY DAY BEFORE BREAKFAST 90 tablet 3  . meloxicam (MOBIC) 15 MG tablet TAKE 1 TABLET BY MOUTH EVERY DAY IN THE MORNING WITH FOOD FOR PAIN 30 tablet 0  . memantine (NAMENDA) 10 MG tablet TAKE 1 TABLET BY MOUTH TWICE A DAY 180 tablet 3  . methylPREDNISolone (MEDROL DOSEPAK) 4 MG TBPK tablet Use as directed 30 tablet 0  . Multiple Vitamins-Minerals (WOMENS MULTI PO) Take 1 tablet by mouth daily.     . traMADol (ULTRAM) 50 MG tablet Take 1 tablet (50 mg total) by mouth 3 (three) times daily as needed for moderate pain. 30 tablet 0  . triamcinolone ointment (KENALOG) 0.1 % APPLY TO THE BODY NIGHTLY (NEVER THE FACE OR GROIN)     No facility-administered medications prior to visit.    PAST MEDICAL HISTORY: Past Medical History:  Diagnosis Date  . Dementia due to another medical condition [facility])    Trisomy 21-related, sees Sethi yearly, nl CT head 2010, did not tolerate aricept  . Depression    with OCD  . Diabetes mellitus (HCC)    "just when she was on TPN"  . DJD (degenerative joint disease) of knee    bilat knee arthritis, had 3/3 Supartz injections [name])  . Down syndrome   . 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 Eczema   . Empyema of left pleural space (HCC) 09/24/2011  . Hypothyroid   . OCD (obsessive compulsive disorder)   . OSA on CPAP 05/2012   severe OSA with AHI 118, desat nadir to 67%  . Pneumonia    multiple pneumonias  . Presenile dementia, uncomplicated (HCC)   . Seizure (HCC) 2013   x 2 ; "very mild"; during  hospitalization "while coming off vent"    PAST SURGICAL HISTORY: Past Surgical History:  Procedure Laterality Date  . ACHILLES TENDON REPAIR Right 01/2017   Dr. [facility]  . ANTERIOR CRUCIATE LIGAMENT REPAIR Left   . BACK SURGERY    . FLEXIBLE BRONCHOSCOPY  09/24/2011   Procedure: FLEXIBLE BRONCHOSCOPY;  Surgeon: Purcell Nails, MD;  Location: [facility] OR;  Service: Thoracic;  Laterality: N/A;  . LUMBAR DISC SURGERY  ruptured disc  . TRACHEOSTOMY TUBE PLACEMENT  10/11/2011   Procedure: TRACHEOSTOMY;  Surgeon: Serena Colonel, MD;  Location: [facility] OR;  Service: ENT;  Laterality: N/A;  . VIDEO ASSISTED THORACOSCOPY (VATS) /THOROCOTOMY/RADIOACTIVE SEED IMPLANT Left 09/24/2011   VATS, (Dr. Barry Dienes)    FAMILY HISTORY: Family History  Problem Relation Age of Onset  . Arthritis Mother   . Hypertension Father   . Diabetes Father   . Cancer Neg Hx   . Coronary artery disease Neg Hx   . Stroke Neg Hx     SOCIAL HISTORY: Social History   Socioeconomic History  . Marital status: Single    Spouse name: Not on file  . Number of children: 0  . Years of education: Not on file  . Highest education level: Not on file  Occupational History  . Not on file  Tobacco Use  . Smoking status: Never Smoker  . Smokeless tobacco: Never Used  Vaping Use  . Vaping Use: Never used  Substance and Sexual Activity  . Alcohol use: No  . Drug use: No  . Sexual activity: Never  Other Topics Concern  . Not on file  Social History Narrative   Lives with mom and dad, dogs   Stays at daycare during the day   Activity: walking some   Diet: good water, fruits/vegetables daily   Social Determinants of Health   Financial Resource Strain: Not on file  Food Insecurity: Not on file  Transportation Needs: Not on file  Physical Activity: Not on file  Stress: Not on file  Social Connections: Not on file  Intimate Partner Violence: Not on file     PHYSICAL EXAM Today's Vitals   07/01/20 1511   BP: 114/68  Pulse: 83  Weight: 245 lb (111.1 kg)  Height:  (1.473 m)   Body mass index is 51.21 kg/m.  General: well nourished with Down's features, pleasant middle-age Caucasian female, seated, in no evident distress Head: head normocephalic and atraumatic.     Neurologic Exam Mental Status: Awake and fully alert. Fluent speech and language with child like quality.  Disoriented to current place and year but able to state her address and birthdate. Recent and remote memory impaired. Attention span, concentration and fund of knowledge impaired with mother providing history. Mood and affect appropriate.  Cranial Nerves: Pupils equal, briskly reactive to light. Extraocular movements full without nystagmus. Visual fields full to confrontation. Hearing intact. Facial sensation intact. Face, tongue, palate moves normally and symmetrically.  Motor: Normal bulk and tone. Normal strength in all tested extremity muscles Sensory.: intact to touch , pinprick , position and vibratory sensation.  Coordination: Rapid alternating movements normal in all extremities. Finger-to-nose and heel-to-shin performed accurately bilaterally. Gait and Station: Arises from chair without difficulty. Stance is normal. Gait demonstrates  broad-based gait with walking boot right foot without use of assistive device.  Tandem walk and heel toe not attempted.  Reflexes: 1+ and symmetric. Toes downgoing.       DIAGNOSTIC DATA (LABS, IMAGING, TESTING) - I reviewed patient records, labs, notes, testing and imaging myself where available.  Lab Results  Component Value Date   WBC 7.4 10/06/2019   HGB 13.7 10/06/2019   HCT 42.6 10/06/2019   MCV 106.0 (H) 10/06/2019   PLT 265 10/06/2019      Component Value Date/Time   NA 135 10/06/2019 1655   K 4.4 10/06/2019 1655   CL 99 10/06/2019 1655   CO2 26 10/06/2019 1655   GLUCOSE  126 (H) 10/06/2019 1655   BUN 12 10/06/2019 1655   CREATININE 0.93 10/06/2019 1655    CALCIUM 9.0 10/06/2019 1655   PROT 7.3 09/24/2019 1359   ALBUMIN 4.1 09/24/2019 1359   AST 23 09/24/2019 1359   ALT 31 09/24/2019 1359   ALKPHOS 71 09/24/2019 1359   BILITOT 0.6 09/24/2019 1359   GFRNONAA >60 10/06/2019 1655   GFRAA >60 10/06/2019 1655    Lab Results  Component Value Date   HGBA1C 5.7 09/24/2019   Lab Results  Component Value Date   VITAMINB12 482 08/29/2016   Lab Results  Component Value Date   TSH 1.24 09/24/2019      ASSESSMENT AND PLAN 50 y.o. year old female  has a past medical history of Downs syndrome; Depression;  Seizure; OSA on CPAP (05/2012);  and memory loss    1.  Seizure disorder -Stable without seizure activity -Continue Keppra 500 mg twice daily for seizure prophylaxis -refill provided  2.  Dementia due to Down syndrome -Concern of possible underlying depression contributing to increased behaviors -Start sertraline 25 mg nightly and trial use of melatonin for sleep-wake disturbance -Continue Namenda 10 mg twice daily -refill provided -Discussed importance of monitoring for specific triggers that increased agitation as well as increasing daytime activity with trying to prevent napping with ensuring bright lights at all times and dark lights at night as safety permits.     Follow-up in 6 months or call earlier if needed   CC:  [facility] provider: Dr. Cannon Kettle, Wynona Canes, MD     Ihor Austin, [facility]  [facility] [address] [place], [place] [address]  Phone [phone] Fax [phone] Note: This document was prepared with digital dictation and possible smart phrase technology. Any transcriptional errors that result from this process are unintentional.

## 2020-07-01 NOTE — Patient Instructions (Addendum)
Your Plan:  Start sertaline 25 mg nightly for possible underlying depression and behaviors assocated with memory loss  Try melatonin 3-6mg  at sun set to help with insomnia as well as increase daytime fatigue and activity  Continue keppra 500mg  twice daily for seizure prevention  Continue namenda 10mg  twice daily     Follow up in 6 months or call earlier if needed     Thank you for coming to see at [facility]. I hope we have been able to provide you high quality care today.  You may receive a patient satisfaction survey over the next few weeks. We would appreciate your feedback and comments so that we may continue to improve ourselves and the health of our patients.

## 2020-07-05 NOTE — Progress Notes (Signed)
I agree with the above plan 

## 2020-07-28 ENCOUNTER — Other Ambulatory Visit: Payer: Self-pay | Admitting: Adult Health

## 2020-08-04 DIAGNOSIS — M1712 Unilateral primary osteoarthritis, left knee: Secondary | ICD-10-CM | POA: Diagnosis not present

## 2020-08-24 ENCOUNTER — Other Ambulatory Visit: Payer: Self-pay | Admitting: Family Medicine

## 2020-08-24 ENCOUNTER — Other Ambulatory Visit: Payer: Self-pay | Admitting: Podiatry

## 2020-08-24 NOTE — Telephone Encounter (Signed)
Last prescription: 06/09/20,last office visit: 06/30/20.Please advise.

## 2020-08-24 NOTE — Telephone Encounter (Signed)
Approved for medication-Mobic ,will need to scheduled  f/u appointment w/ Dr Logan Bores 08/25/20.

## 2020-08-25 ENCOUNTER — Ambulatory Visit (INDEPENDENT_AMBULATORY_CARE_PROVIDER_SITE_OTHER): Payer: Medicare PPO

## 2020-08-25 ENCOUNTER — Ambulatory Visit (INDEPENDENT_AMBULATORY_CARE_PROVIDER_SITE_OTHER): Payer: Medicare PPO | Admitting: Podiatry

## 2020-08-25 ENCOUNTER — Other Ambulatory Visit: Payer: Self-pay | Admitting: Podiatry

## 2020-08-25 DIAGNOSIS — M19071 Primary osteoarthritis, right ankle and foot: Secondary | ICD-10-CM

## 2020-08-25 DIAGNOSIS — R600 Localized edema: Secondary | ICD-10-CM | POA: Diagnosis not present

## 2020-08-25 DIAGNOSIS — M25572 Pain in left ankle and joints of left foot: Secondary | ICD-10-CM | POA: Diagnosis not present

## 2020-08-25 DIAGNOSIS — M19079 Primary osteoarthritis, unspecified ankle and foot: Secondary | ICD-10-CM

## 2020-08-25 DIAGNOSIS — M7752 Other enthesopathy of left foot: Secondary | ICD-10-CM

## 2020-08-25 NOTE — Progress Notes (Signed)
   Subjective:  50 y.o. female presenting today presenting with her mother for evaluation of chronic left ankle pain.  Patient states that she has worn the boot now for over 1 year.  When she wears the boot she feels significantly better however as soon as she returns out of the boot the pain and the swelling returns to the left ankle.  She does have a history of peroneus brevis tendon repair to the left lower extremity.  DOS: 02/01/2017.  Patient continues to have pain and swelling to the ankle despite taking oral tramadol and meloxicam as per PCP.  She presents for further treatment and evaluation   Past Medical History:  Diagnosis Date   Dementia due to another medical condition (HCC)    Trisomy 21-related, sees Sethi yearly, nl CT head 2010, did not tolerate aricept   Depression    with OCD   Diabetes mellitus (HCC)    "just when she was on TPN"   DJD (degenerative joint disease) of knee    bilat knee arthritis, had 3/3 Supartz injections [name])   Down syndrome    Dry ARMD 02/2013   Eczema    Empyema of left pleural space (HCC) 09/24/2011   Hypothyroid    OCD (obsessive compulsive disorder)    OSA on CPAP 05/2012   severe OSA with AHI 118, desat nadir to 67%   Pneumonia    multiple pneumonias   Presenile dementia, uncomplicated (HCC)    Seizure (HCC) 2013   x 2 ; "very mild"; during hospitalization "while coming off vent"     Objective / Physical Exam:  General:  The patient is alert and oriented x3 in no acute distress. Dermatology:  Skin is warm, dry and supple bilateral lower extremities. Negative for open lesions or macerations. Vascular:  Palpable pedal pulses bilaterally. No erythema noted. Capillary refill within normal limits. Neurological:  Epicritic and protective threshold grossly intact bilaterally.  Musculoskeletal Exam:  Pain on palpation to the anterior lateral medial aspects of the patient's left ankle. Mild edema noted. Range of motion within normal limits  to all pedal and ankle joints bilateral.  There is some moderate edema noted to the left ankle as well muscle strength 5/5 in all groups bilateral.   Radiographic Exam:  Normal osseous mineralization. Joint spaces preserved. No fracture/dislocation/boney destruction.    Assessment: 1.  Left ankle edema/synovitis/ankle pain; chronic for over 1 year now  Plan of Care:  1. Patient was evaluated. X-Rays reviewed.  2.  Due to the nonhealing nature of the left ankle pain and the recurrent swelling we are going to order MRI left ankle.  Patient has not improved despite conservative care 3.  Continue cam boot as needed 4.  Return to clinic after MRI to review results and discuss different treatment options   Felecia Shelling, DPM [facility]  Dr. Felecia Shelling, DPM    [address] [place], [place] [address]                Office [phone]  Fax [phone]

## 2020-09-01 ENCOUNTER — Telehealth: Payer: Self-pay | Admitting: Family Medicine

## 2020-09-01 ENCOUNTER — Other Ambulatory Visit: Payer: Self-pay | Admitting: Podiatry

## 2020-09-02 NOTE — Telephone Encounter (Signed)
E-scribed refill.  Looks like pt was only scheduled for cpe on 10/18/20.  Plz schedule wellness and lab visits before cpe date.

## 2020-09-02 NOTE — Telephone Encounter (Signed)
Noted  

## 2020-09-02 NOTE — Telephone Encounter (Signed)
Spoke with patient 's mother scheduled labs prior to CPE

## 2020-09-02 NOTE — Telephone Encounter (Signed)
Please advise 

## 2020-09-15 ENCOUNTER — Ambulatory Visit
Admission: RE | Admit: 2020-09-15 | Discharge: 2020-09-15 | Disposition: A | Discharge status: Home / Self Care | Payer: Medicare PPO | Source: Ambulatory Visit | Attending: Podiatry | Admitting: Podiatry

## 2020-09-15 ENCOUNTER — Other Ambulatory Visit: Payer: Self-pay | Admitting: Podiatry

## 2020-09-15 ENCOUNTER — Other Ambulatory Visit: Payer: Self-pay

## 2020-09-15 DIAGNOSIS — M7752 Other enthesopathy of left foot: Secondary | ICD-10-CM

## 2020-09-15 DIAGNOSIS — M7751 Other enthesopathy of right foot: Secondary | ICD-10-CM

## 2020-09-15 DIAGNOSIS — M25571 Pain in right ankle and joints of right foot: Secondary | ICD-10-CM | POA: Diagnosis not present

## 2020-09-15 DIAGNOSIS — R600 Localized edema: Secondary | ICD-10-CM

## 2020-09-15 DIAGNOSIS — M25572 Pain in left ankle and joints of left foot: Secondary | ICD-10-CM

## 2020-09-15 DIAGNOSIS — M7989 Other specified soft tissue disorders: Secondary | ICD-10-CM | POA: Diagnosis not present

## 2020-09-20 ENCOUNTER — Telehealth: Payer: Self-pay | Admitting: Podiatry

## 2020-09-20 NOTE — Telephone Encounter (Signed)
Pt mom called and wanted to know if she needed to come in for her daughter MRI results or would she get a call

## 2020-09-21 NOTE — Telephone Encounter (Signed)
Spoke with the patient's mother.  Discussed the MRI findings.  She is coming in on 10/04/2020 for surgical consult.  Thanks, Dr. Logan Bores

## 2020-09-29 DIAGNOSIS — Q909 Down syndrome, unspecified: Secondary | ICD-10-CM | POA: Diagnosis not present

## 2020-09-29 DIAGNOSIS — E662 Morbid (severe) obesity with alveolar hypoventilation: Secondary | ICD-10-CM | POA: Diagnosis not present

## 2020-10-04 ENCOUNTER — Ambulatory Visit (INDEPENDENT_AMBULATORY_CARE_PROVIDER_SITE_OTHER): Payer: Medicare PPO | Admitting: Podiatry

## 2020-10-04 ENCOUNTER — Other Ambulatory Visit: Payer: Self-pay

## 2020-10-04 ENCOUNTER — Ambulatory Visit: Payer: Medicare PPO | Admitting: Podiatry

## 2020-10-04 DIAGNOSIS — M79675 Pain in left toe(s): Secondary | ICD-10-CM | POA: Diagnosis not present

## 2020-10-04 DIAGNOSIS — B351 Tinea unguium: Secondary | ICD-10-CM | POA: Diagnosis not present

## 2020-10-04 DIAGNOSIS — M25371 Other instability, right ankle: Secondary | ICD-10-CM

## 2020-10-04 DIAGNOSIS — M19071 Primary osteoarthritis, right ankle and foot: Secondary | ICD-10-CM | POA: Diagnosis not present

## 2020-10-04 DIAGNOSIS — M79674 Pain in right toe(s): Secondary | ICD-10-CM

## 2020-10-04 DIAGNOSIS — R600 Localized edema: Secondary | ICD-10-CM | POA: Diagnosis not present

## 2020-10-09 ENCOUNTER — Other Ambulatory Visit: Payer: Self-pay | Admitting: Family Medicine

## 2020-10-09 DIAGNOSIS — F0390 Unspecified dementia without behavioral disturbance: Secondary | ICD-10-CM

## 2020-10-09 DIAGNOSIS — E039 Hypothyroidism, unspecified: Secondary | ICD-10-CM

## 2020-10-09 DIAGNOSIS — M1A0620 Idiopathic chronic gout, left knee, without tophus (tophi): Secondary | ICD-10-CM

## 2020-10-09 DIAGNOSIS — R7303 Prediabetes: Secondary | ICD-10-CM

## 2020-10-11 ENCOUNTER — Other Ambulatory Visit (INDEPENDENT_AMBULATORY_CARE_PROVIDER_SITE_OTHER): Payer: Medicare PPO

## 2020-10-11 ENCOUNTER — Other Ambulatory Visit: Payer: Self-pay

## 2020-10-11 ENCOUNTER — Encounter: Payer: Self-pay | Admitting: Podiatry

## 2020-10-11 DIAGNOSIS — F0390 Unspecified dementia without behavioral disturbance: Secondary | ICD-10-CM | POA: Diagnosis not present

## 2020-10-11 DIAGNOSIS — R7303 Prediabetes: Secondary | ICD-10-CM | POA: Diagnosis not present

## 2020-10-11 DIAGNOSIS — M1A0620 Idiopathic chronic gout, left knee, without tophus (tophi): Secondary | ICD-10-CM

## 2020-10-11 DIAGNOSIS — E039 Hypothyroidism, unspecified: Secondary | ICD-10-CM

## 2020-10-11 LAB — CBC WITH DIFFERENTIAL/PLATELET
Basophils Absolute: 0.1 10*3/uL (ref 0.0–0.1)
Basophils Relative: 2.3 % (ref 0.0–3.0)
Eosinophils Absolute: 0.1 10*3/uL (ref 0.0–0.7)
Eosinophils Relative: 2.3 % (ref 0.0–5.0)
HCT: 39.8 % (ref 36.0–46.0)
Hemoglobin: 13 g/dL (ref 12.0–15.0)
Lymphocytes Relative: 28.4 % (ref 12.0–46.0)
Lymphs Abs: 1.2 10*3/uL (ref 0.7–4.0)
MCHC: 32.7 g/dL (ref 30.0–36.0)
MCV: 104.4 fl — ABNORMAL HIGH (ref 78.0–100.0)
Monocytes Absolute: 0.4 10*3/uL (ref 0.1–1.0)
Monocytes Relative: 8.6 % (ref 3.0–12.0)
Neutro Abs: 2.4 10*3/uL (ref 1.4–7.7)
Neutrophils Relative %: 58.4 % (ref 43.0–77.0)
Platelets: 209 10*3/uL (ref 150.0–400.0)
RBC: 3.82 Mil/uL — ABNORMAL LOW (ref 3.87–5.11)
RDW: 15.7 % — ABNORMAL HIGH (ref 11.5–15.5)
WBC: 4.1 10*3/uL (ref 4.0–10.5)

## 2020-10-11 LAB — COMPREHENSIVE METABOLIC PANEL
ALT: 24 U/L (ref 0–35)
AST: 20 U/L (ref 0–37)
Albumin: 3.7 g/dL (ref 3.5–5.2)
Alkaline Phosphatase: 55 U/L (ref 39–117)
BUN: 14 mg/dL (ref 6–23)
CO2: 28 mEq/L (ref 19–32)
Calcium: 8.9 mg/dL (ref 8.4–10.5)
Chloride: 103 mEq/L (ref 96–112)
Creatinine, Ser: 1.05 mg/dL (ref 0.40–1.20)
GFR: 62.14 mL/min (ref >60.00)
Glucose, Bld: 127 mg/dL — ABNORMAL HIGH (ref 70–99)
Potassium: 4.7 mEq/L (ref 3.5–5.1)
Sodium: 140 mEq/L (ref 135–145)
Total Bilirubin: 0.4 mg/dL (ref 0.2–1.2)
Total Protein: 6.6 g/dL (ref 6.0–8.3)

## 2020-10-11 LAB — LIPID PANEL
Cholesterol: 145 mg/dL (ref 0–200)
HDL: 56.6 mg/dL (ref >39.00)
LDL Cholesterol: 76 mg/dL (ref 0–99)
NonHDL: 88.01
Total CHOL/HDL Ratio: 3
Triglycerides: 60 mg/dL (ref 0.0–149.0)
VLDL: 12 mg/dL (ref 0.0–40.0)

## 2020-10-11 LAB — URIC ACID: Uric Acid, Serum: 5.9 mg/dL (ref 2.4–7.0)

## 2020-10-11 LAB — TSH: TSH: 1.83 u[IU]/mL (ref 0.35–5.50)

## 2020-10-11 LAB — HEMOGLOBIN A1C: Hgb A1c MFr Bld: 6 % (ref 4.6–6.5)

## 2020-10-11 LAB — VITAMIN B12: Vitamin B-12: 525 pg/mL (ref 211–911)

## 2020-10-11 NOTE — Progress Notes (Signed)
Subjective:  50 y.o. [name] presenting today presenting with her mother for evaluation of chronic right ankle pain.  Patient states that she has worn the boot now off and on for over 1 year.  When she wears the boot she feels significantly better however as soon as she returns out of the boot the pain and the swelling returns to the left ankle.  She does have a history of peroneus brevis tendon repair to the left lower extremity.  DOS: 02/01/2017.  Patient continues to have pain and swelling to the ankle despite taking oral tramadol and meloxicam as per PCP.  She presents for further treatment and evaluation   Past Medical History:  Diagnosis Date   Dementia due to another medical condition (HCC)    Trisomy 21-related, sees Sethi yearly, nl CT head 2010, did not tolerate aricept   Depression    with OCD   Diabetes mellitus (HCC)    "just when she was on TPN"   DJD (degenerative joint disease) of knee    bilat knee arthritis, had 3/3 Supartz injections [name])   Down syndrome    Dry ARMD 02/2013   Eczema    Empyema of left pleural space (HCC) 09/24/2011   Hypothyroid    OCD (obsessive compulsive disorder)    OSA on CPAP 05/2012   severe OSA with AHI 118, desat nadir to 67%   Pneumonia    multiple pneumonias   Presenile dementia, uncomplicated (HCC)    Seizure (HCC) 2013   x 2 ; "very mild"; during hospitalization "while coming off vent"     Objective / Physical Exam:  General:  The patient is alert and oriented x3 in no acute distress. Dermatology:  Skin is warm, dry and supple bilateral lower extremities. Negative for open lesions or macerations. Vascular:  Palpable pedal pulses bilaterally. No erythema noted. Capillary refill within normal limits. Neurological:  Epicritic and protective threshold grossly intact bilaterally.  Musculoskeletal Exam:  Pain on palpation to the anterior and mostly lateral aspects of the patient's right ankle. Mild edema noted. Range of motion  within normal limits to all pedal and ankle joints bilateral.  There is some moderate edema noted to the right ankle as well muscle strength 5/5 in all groups bilateral.   MR ankle RT WO contrast 09/15/2020: IMPRESSION: 1. New mild distal posterior tibial tendinosis with small focal partial tear of the retromalleolar tendon. 2. Progressive moderate to severe talonavicular and mild tibiotalar osteoarthritis. 3. New significant thickening and increased signal of the anterior talofibular ligament, suspicious for age indeterminate severe sprain.  Assessment: 1.  Left ankle edema/synovitis/ankle pain; chronic for over 1 year now 2.  Right ankle instability with recurrent ankle sprains 3.  DJD right ankle 4.  Pain due to onychomycosis of toenails both  Plan of Care:  1. Patient was evaluated.  MRI reviewed.  2.  Mechanical debridement of nails 1-5 bilateral was performed using a nail nipper without incident or bleeding 3. Today we discussed the conservative versus surgical management of the presenting pathology. The patient opts for surgical management. All possible complications and details of the procedure were explained. All patient questions were answered. No guarantees were expressed or implied. 4. Authorization for surgery was initiated today. Surgery will consist of right ankle arthroscopy.  Right ankle lateral collateral ligament stabilization using an Arthrex internal brace. 5.  Return to clinic 1 week postop   *Please note that previous progress notes indicated left ankle.  This is incorrect.  It is the right ankle that is symptomatic and we have been treating.   Felecia Shelling, DPM [facility]  Dr. Felecia Shelling, DPM    [address] [place], [place] [address]                Office [phone]  Fax [phone]

## 2020-10-12 ENCOUNTER — Telehealth: Payer: Self-pay | Admitting: Urology

## 2020-10-12 NOTE — Telephone Encounter (Signed)
DOS - 11/04/20   REPAIR LIGAMENT RIGHT ANKLE --- [id number] ARTHROSCOPY RIGHT ANKLE --- [id number]  [employer] EFFECTIVE DATE - 02/28/19   PER [employer] STIE FOR CPT CODES 73532 AND 99242 NO PRIOR AUTH IS REQUIRED

## 2020-10-18 ENCOUNTER — Encounter: Payer: Self-pay | Admitting: Family Medicine

## 2020-10-18 ENCOUNTER — Other Ambulatory Visit: Payer: Self-pay

## 2020-10-18 ENCOUNTER — Ambulatory Visit (INDEPENDENT_AMBULATORY_CARE_PROVIDER_SITE_OTHER): Payer: Medicare PPO | Admitting: Family Medicine

## 2020-10-18 VITALS — BP 112/68 | HR 60 | Temp 97.7°F | Ht <= 58 in | Wt 247.0 lb

## 2020-10-18 DIAGNOSIS — Q909 Down syndrome, unspecified: Secondary | ICD-10-CM

## 2020-10-18 DIAGNOSIS — Z01818 Encounter for other preprocedural examination: Secondary | ICD-10-CM

## 2020-10-18 DIAGNOSIS — Z7189 Other specified counseling: Secondary | ICD-10-CM

## 2020-10-18 DIAGNOSIS — R569 Unspecified convulsions: Secondary | ICD-10-CM

## 2020-10-18 DIAGNOSIS — Z1211 Encounter for screening for malignant neoplasm of colon: Secondary | ICD-10-CM

## 2020-10-18 DIAGNOSIS — M25571 Pain in right ankle and joints of right foot: Secondary | ICD-10-CM | POA: Diagnosis not present

## 2020-10-18 DIAGNOSIS — M1A0620 Idiopathic chronic gout, left knee, without tophus (tophi): Secondary | ICD-10-CM | POA: Diagnosis not present

## 2020-10-18 DIAGNOSIS — E039 Hypothyroidism, unspecified: Secondary | ICD-10-CM

## 2020-10-18 DIAGNOSIS — R7303 Prediabetes: Secondary | ICD-10-CM | POA: Diagnosis not present

## 2020-10-18 DIAGNOSIS — F3342 Major depressive disorder, recurrent, in full remission: Secondary | ICD-10-CM

## 2020-10-18 DIAGNOSIS — F0390 Unspecified dementia without behavioral disturbance: Secondary | ICD-10-CM

## 2020-10-18 DIAGNOSIS — G8929 Other chronic pain: Secondary | ICD-10-CM

## 2020-10-18 DIAGNOSIS — D7589 Other specified diseases of blood and blood-forming organs: Secondary | ICD-10-CM

## 2020-10-18 DIAGNOSIS — Z0000 Encounter for general adult medical examination without abnormal findings: Secondary | ICD-10-CM | POA: Diagnosis not present

## 2020-10-18 DIAGNOSIS — M19079 Primary osteoarthritis, unspecified ankle and foot: Secondary | ICD-10-CM

## 2020-10-18 DIAGNOSIS — Z6841 Body Mass Index (BMI) 40.0 and over, adult: Secondary | ICD-10-CM

## 2020-10-18 NOTE — Assessment & Plan Note (Signed)
Preventative protocols reviewed and updated unless pt declined. Discussed healthy diet and lifestyle.  

## 2020-10-18 NOTE — Progress Notes (Signed)
Patient ID: [name], female    DOB: [birth year], 50 y.o.   MRN: [id number]  This visit was conducted in person.  BP 112/68   Pulse 60   Temp 97.7 F (36.5 C) (Temporal)   Ht _0  (1.473 m)   Wt 247 lb (112 kg)   SpO2 94%   BMI 51.62 kg/m    CC: CPE Subjective:   HPI: [name] is a 50 y.o. female presenting on 10/18/2020 for Annual Exam   Did not see health advisor this year.   Hearing Screening   _1  _2  _3  _4   Right ear 40 40 40 40  Left ear 40 40 40 40   Vision Screening   Right eye Left eye Both eyes  Without correction     With correction _5    Flowsheet Row Office Visit from 10/18/2020 in [facility] [place]  PHQ-2 Total Score 0       Fall Risk  10/18/2020 07/31/2018 08/08/2017 09/08/2016 09/08/2016  Falls in the past year? 0 0 No No Yes  Number falls in past yr: 0 - - - -  Injury with Fall? 0 - - - -   Needs clearance for upcoming ankle surgery scheduled 11/11/2020, planned under general anesthesia through outpatient surgical center. Ankle hurts with walking. No inciting trauma/injury.  Last surgery was achilles tendon repair on right 2018. No trouble waking up from anesthesia. No trouble with post-op nausea or vomiting.  No chest pain, dyspnea, dizziness, leg swelling, headache, palpitation. No cough, diarrhea, dysuria, urgency or frequency.   Preventative: Colon cancer screening - discussed. Will start with iFOB - did not complete last year. Agrees to retry this year.  Well woman - discussed with mom. Doesn't want pap smear or pelvic exam or breast exam.  LMP irregular - last 2 months ago light.  Mammogram 07/2019 [facility]1 - will call to reschedule  Flu shot - yearly  [place] x2 05/2019, booster x1 Pneumovax 2013.  Tdap - 01/2013.  Shingrix - discussed.  Advanced directives: mom would be HCPOA but this has not been set up yet - encouraged they work on this.  Seat belt use discussed.   Sunscreen use discussed. No changing moles.  Non smoker  Alcohol - none  Dentist - overdue - upcoming appt this month  Eye exam Q6 mo ([facility])  Bowel - no constipation  Bladder - no incontinence  Mother hasn't noticed any trouble with memory  Hearing well   Lives with mom and dad, dogs Stays at home during the day, no longer goes to adult daycare Activity: none - activity limited by pain Diet: some water, likes coke zero, fruits/vegetables daily - encouraged limited sugar/carbs     Relevant past medical, surgical, family and social history reviewed and updated as indicated. Interim medical history since our last visit reviewed. Allergies and medications reviewed and updated. Outpatient Medications Prior to Visit  Medication Sig Dispense Refill   acetaminophen (TYLENOL) 500 MG tablet Take 1 tablet (500 mg total) by mouth in the morning, at noon, and at bedtime.     albuterol (PROVENTIL) (2.5 MG/3ML) 0.083% nebulizer solution USE 1 VIAL VIA NEBULIZER EVERY 6 HOURS AS NEEDED FOR WHEEZING OR SHORTNESS OF BREATH 75 mL 3   allopurinol (ZYLOPRIM) 100 MG tablet TAKE 1 TABLET BY MOUTH EVERY DAY 90 tablet 0   Cholecalciferol (VITAMIN D3 PO) Take 500 Units by mouth daily.     clobetasol  cream (TEMOVATE) 0.05 % APPLY TO AFFECTED AREA TWICE A DAY. (2 WEEKS AT A TIME) 45 g 0   colchicine 0.6 MG tablet TAKE 1 TABLET BY MOUTH EVERY DAY AS NEEDED FOR GOUT FLARE 30 tablet 5   ketoconazole (NIZORAL) 2 % cream Apply 1 application topically 2 (two) times daily as needed for irritation. 60 g 1   ketoconazole (NIZORAL) 2 % shampoo      levETIRAcetam (KEPPRA) 500 MG tablet Take 1 tablet (500 mg total) by mouth 2 (two) times daily. 180 tablet 3   levothyroxine (SYNTHROID) 125 MCG tablet TAKE 1 TABLET BY MOUTH EVERY DAY BEFORE BREAKFAST 90 tablet 0   meloxicam (MOBIC) 15 MG tablet TAKE 1 TABLET BY MOUTH EVERY DAY IN THE MORNING WITH FOOD FOR PAIN 30 tablet 0   memantine (NAMENDA) 10 MG tablet Take 1 tablet (10  mg total) by mouth 2 (two) times daily. 180 tablet 3   Multiple Vitamins-Minerals (WOMENS MULTI PO) Take 1 tablet by mouth daily.      sertraline (ZOLOFT) 25 MG tablet TAKE 1 TABLET BY MOUTH EVERYDAY AT BEDTIME 90 tablet 1   traMADol (ULTRAM) 50 MG tablet Take 1 tablet (50 mg total) by mouth 3 (three) times daily as needed for moderate pain. 30 tablet 0   triamcinolone ointment (KENALOG) 0.1 % APPLY TO THE BODY NIGHTLY (NEVER THE FACE OR GROIN)     methylPREDNISolone (MEDROL DOSEPAK) 4 MG TBPK tablet Use as directed 30 tablet 0   No facility-administered medications prior to visit.     Per HPI unless specifically indicated in ROS section below Review of Systems  Objective:  BP 112/68   Pulse 60   Temp 97.7 F (36.5 C) (Temporal)   Ht _0  (1.473 m)   Wt 247 lb (112 kg)   SpO2 94%   BMI 51.62 kg/m   Wt Readings from Last 3 Encounters:  10/18/20 247 lb (112 kg)  07/01/20 245 lb (111.1 kg)  09/24/19 (!) 241 lb 5 oz (109.5 kg)      Physical Exam Vitals and nursing note reviewed.  Constitutional:      Appearance: Normal appearance. She is obese. She is not ill-appearing.     Comments: Needs assistance to get on exam table  HENT:     Head: Normocephalic and atraumatic.     Right Ear: Tympanic membrane, ear canal and external ear normal. There is no impacted cerumen.     Left Ear: Tympanic membrane, ear canal and external ear normal. There is no impacted cerumen.  Eyes:     General:        Right eye: No discharge.        Left eye: No discharge.     Extraocular Movements: Extraocular movements intact.     Conjunctiva/sclera: Conjunctivae normal.     Pupils: Pupils are equal, round, and reactive to light.  Neck:     Thyroid: No thyroid mass or thyromegaly.  Cardiovascular:     Rate and Rhythm: Normal rate and regular rhythm.     Pulses: Normal pulses.     Heart sounds: Normal heart sounds. No murmur heard. Pulmonary:     Effort: Pulmonary effort is normal. No respiratory  distress.     Breath sounds: Normal breath sounds. No wheezing, rhonchi or rales.  Abdominal:     General: Bowel sounds are normal. There is no distension.     Palpations: Abdomen is soft. There is no mass.     Tenderness:  There is no abdominal tenderness. There is no guarding or rebound.     Hernia: No hernia is present.  Musculoskeletal:        General: Swelling and tenderness present.     Cervical back: Normal range of motion and neck supple. No rigidity.     Right lower leg: Edema present.     Left lower leg: Edema present.     Comments: R ankle pain limits ambulation  Lymphadenopathy:     Cervical: No cervical adenopathy.  Skin:    General: Skin is warm and dry.     Findings: No rash.  Neurological:     General: No focal deficit present.     Mental Status: She is alert. Mental status is at baseline.  Psychiatric:        Mood and Affect: Mood normal.        Behavior: Behavior normal.      Results for orders placed or performed in visit on 10/11/20  CBC with Differential/Platelet  Result Value Ref Range   WBC 4.1 4.0 - 10.5 K/uL   RBC 3.82 (L) 3.87 - 5.11 Mil/uL   Hemoglobin 13.0 12.0 - 15.0 g/dL   HCT 39.8 36.0 - 46.0 %   MCV 104.4 (H) 78.0 - 100.0 fl   MCHC 32.7 30.0 - 36.0 g/dL   RDW 15.7 (H) 11.5 - 15.5 %   Platelets 209.0 150.0 - 400.0 K/uL   Neutrophils Relative % 58.4 43.0 - 77.0 %   Lymphocytes Relative 28.4 12.0 - 46.0 %   Monocytes Relative 8.6 3.0 - 12.0 %   Eosinophils Relative 2.3 0.0 - 5.0 %   Basophils Relative 2.3 0.0 - 3.0 %   Neutro Abs 2.4 1.4 - 7.7 K/uL   Lymphs Abs 1.2 0.7 - 4.0 K/uL   Monocytes Absolute 0.4 0.1 - 1.0 K/uL   Eosinophils Absolute 0.1 0.0 - 0.7 K/uL   Basophils Absolute 0.1 0.0 - 0.1 K/uL  TSH  Result Value Ref Range   TSH 1.83 0.35 - 5.50 uIU/mL  Hemoglobin A1c  Result Value Ref Range   Hgb A1c MFr Bld 6.0 4.6 - 6.5 %  Uric acid  Result Value Ref Range   Uric Acid, Serum 5.9 2.4 - 7.0 mg/dL  Comprehensive metabolic panel   Result Value Ref Range   Sodium 140 135 - 145 mEq/L   Potassium 4.7 3.5 - 5.1 mEq/L   Chloride 103 96 - 112 mEq/L   CO2 28 19 - 32 mEq/L   Glucose, Bld 127 (H) 70 - 99 mg/dL   BUN 14 6 - 23 mg/dL   Creatinine, Ser 1.05 0.40 - 1.20 mg/dL   Total Bilirubin 0.4 0.2 - 1.2 mg/dL   Alkaline Phosphatase 55 39 - 117 U/L   AST 20 0 - 37 U/L   ALT 24 0 - 35 U/L   Total Protein 6.6 6.0 - 8.3 g/dL   Albumin 3.7 3.5 - 5.2 g/dL   GFR 62.14 >60.00 mL/min   Calcium 8.9 8.4 - 10.5 mg/dL  Lipid panel  Result Value Ref Range   Cholesterol 145 0 - 200 mg/dL   Triglycerides 60.0 0.0 - 149.0 mg/dL   HDL 56.60 >39.00 mg/dL   VLDL 12.0 0.0 - 40.0 mg/dL   LDL Cholesterol 76 0 - 99 mg/dL   Total CHOL/HDL Ratio 3    NonHDL 88.01   Vitamin B12  Result Value Ref Range   Vitamin B-12 525 211 - 911 pg/mL  EKG - NSR rate 70, normal axis, intervals, no hypertrophy or acute ST/T changes   Assessment & Plan:  This visit occurred during the SARS-CoV-2 public health emergency.  Safety protocols were in place, including screening questions prior to the visit, additional usage of staff PPE, and extensive cleaning of exam room while observing appropriate contact time as indicated for disinfecting solutions.   Problem List Items Addressed This Visit     Medicare annual wellness visit, subsequent - Primary (Chronic)    I have personally reviewed the Medicare Annual Wellness questionnaire and have noted 1. The patient's medical and social history 2. Their use of alcohol, tobacco or illicit drugs 3. Their current medications and supplements 4. The patient's functional ability including ADL's, fall risks, home safety risks and hearing or visual impairment. Cognitive function has been assessed and addressed as indicated.  5. Diet and physical activity 6. Evidence for depression or mood disorders The patients weight, height, BMI have been recorded in the chart. I have made referrals, counseling and provided  education to the patient based on review of the above and I have provided the pt with a written personalized care plan for preventive services. Provider list updated.. See scanned questionairre as needed for further documentation. Reviewed preventative protocols and updated unless pt declined.       Health maintenance examination (Chronic)    Preventative protocols reviewed and updated unless pt declined. Discussed healthy diet and lifestyle.       Advanced care planning/counseling discussion (Chronic)    Advanced directives: mom would be HCPOA but this has not been set up yet - encouraged they work on this.       Hypothyroidism    Chronic, stable on current regimen - continue.       Seizure ([facility])    Isolate seizure during hospitalization for acute illness 2003, continues keppra, followed by neurology.       Prediabetes    A1c remains in prediabetes range.       MDD (major depressive disorder), recurrent, in full remission ([facility])    Stable period on low dose sertraline 24m.       Down syndrome    Lives with parents      Chronic gout    Chronic, stable on allopurinol daily.       Morbid obesity with BMI of 50.0-59.9, [place] ([facility]    Ongoing struggle to lose weight. Worse with current ankle pain.       Right ankle pain    Chronic R ankle instability with recurrent sprains in known DJD, has upcoming ankle surgery planned next month (Dr EAmalia Hailey to entail arthroscopy and lateral collateral ligament stabilization using Arthrex internal brace.  Needs preop clearance today - see below.       Presenile dementia, uncomplicated ([facility]    No memory concerns identified - continues namenda 131mbid.       Macrocytosis without anemia    Chronic. b12 normal. Prior eval including folate, peripheral smear all normal. Consider copper level, consider heme eval.       Ankle arthritis   Pre-op evaluation    RCRI = 0 EKG today without acute finding. Pending CXR.  No known history  of CHF, insulin use, CKD, ischemic heart disease or cerebrovascular disease.  Anticipate adequately low risk to proceed with planned surgery.  No noted contra-indications. No need to hold any medication perioperatively. She has OSA on CPAP but no planned overnight stay.       Relevant  Orders   EKG 12-Lead (Completed)   DG Chest 2 View   Other Visit Diagnoses     Special screening for malignant neoplasms, colon       Relevant Orders   Fecal occult blood, imunochemical        No orders of the defined types were placed in this encounter.  Orders Placed This Encounter  Procedures   Fecal occult blood, imunochemical    Standing Status:   Future    Standing Expiration Date:   10/18/2021   DG Chest 2 View    Standing Status:   Future    Standing Expiration Date:   10/18/2021    Order Specific Question:   Reason for Exam (SYMPTOM  OR DIAGNOSIS REQUIRED)    Answer:   preop eval    Order Specific Question:   Is patient pregnant?    Answer:   No    Order Specific Question:   Preferred imaging location?    Answer:   [facility]   EKG 12-Lead     Patient instructions: Pass by lab to pick up stool kit.  EKG today.  Go to [facility] imaging at your convenience for chest xray.  Send [place] date of latest COVID shot. If interested, check with pharmacy about new 2 shot shingles series (shingrix).  Good to see you today, return as needed or in 1 year for next wellness visit.  Follow up plan: Return in about 1 year (around 10/18/2021).  Ria Bush, MD

## 2020-10-18 NOTE — Assessment & Plan Note (Signed)

## 2020-10-18 NOTE — Patient Instructions (Addendum)
Pass by lab to pick up stool kit.  EKG today.  Go to [facility] imaging at your convenience for chest xray.  Send [place] date of latest COVID shot. If interested, check with pharmacy about new 2 shot shingles series (shingrix).  Good to see you today, return as needed or in 1 year for next wellness visit.  Health Maintenance, Female Adopting a healthy lifestyle and getting preventive care are important in promoting health and wellness. Ask your health care provider about: The right schedule for you to have regular tests and exams. Things you can do on your own to prevent diseases and keep yourself healthy. What should I know about diet, weight, and exercise? Eat a healthy diet  Eat a diet that includes plenty of vegetables, fruits, low-fat dairy products, and lean protein. Do not eat a lot of foods that are high in solid fats, added sugars, or sodium.  Maintain a healthy weight Body mass index (BMI) is used to identify weight problems. It estimates body fat based on height and weight. Your health care provider can help determineyour BMI and help you achieve or maintain a healthy weight. Get regular exercise Get regular exercise. This is one of the most important things you can do for your health. Most adults should: Exercise for at least 150 minutes each week. The exercise should increase your heart rate and make you sweat (moderate-intensity exercise). Do strengthening exercises at least twice a week. This is in addition to the moderate-intensity exercise. Spend less time sitting. Even light physical activity can be beneficial. Watch cholesterol and blood lipids Have your blood tested for lipids and cholesterol at 50 years of age, then havethis test every 5 years. Have your cholesterol levels checked more often if: Your lipid or cholesterol levels are high. You are older than 50 years of age. You are at high risk for heart disease. What should I know about cancer screening? Depending on  your health history and family history, you may need to have cancer screening at various ages. This may include screening for: Breast cancer. Cervical cancer. Colorectal cancer. Skin cancer. Lung cancer. What should I know about heart disease, diabetes, and high blood pressure? Blood pressure and heart disease High blood pressure causes heart disease and increases the risk of stroke. This is more likely to develop in people who have high blood pressure readings, are of African descent, or are overweight. Have your blood pressure checked: Every 3-5 years if you are 34-72 years of age. Every year if you are 34 years old or older. Diabetes Have regular diabetes screenings. This checks your fasting blood sugar level. Have the screening done: Once every three years after age 39 if you are at a normal weight and have a low risk for diabetes. More often and at a younger age if you are overweight or have a high risk for diabetes. What should I know about preventing infection? Hepatitis B If you have a higher risk for hepatitis B, you should be screened for this virus. Talk with your health care provider to find out if you are at risk forhepatitis B infection. Hepatitis C Testing is recommended for: Everyone born from [birth year] through 1965. Anyone with known risk factors for hepatitis C. Sexually transmitted infections (STIs) Get screened for STIs, including gonorrhea and chlamydia, if: You are sexually active and are younger than 50 years of age. You are older than 50 years of age and your health care provider tells you that you are at risk  for this type of infection. Your sexual activity has changed since you were last screened, and you are at increased risk for chlamydia or gonorrhea. Ask your health care provider if you are at risk. Ask your health care provider about whether you are at high risk for HIV. Your health care provider may recommend a prescription medicine to help prevent HIV  infection. If you choose to take medicine to prevent HIV, you should first get tested for HIV. You should then be tested every 3 months for as long as you are taking the medicine. Pregnancy If you are about to stop having your period (premenopausal) and you may become pregnant, seek counseling before you get pregnant. Take 400 to 800 micrograms (mcg) of folic acid every day if you become pregnant. Ask for birth control (contraception) if you want to prevent pregnancy. Osteoporosis and menopause Osteoporosis is a disease in which the bones lose minerals and strength with aging. This can result in bone fractures. If you are 53 years old or older, or if you are at risk for osteoporosis and fractures, ask your health care provider if you should: Be screened for bone loss. Take a calcium or vitamin D supplement to lower your risk of fractures. Be given hormone replacement therapy (HRT) to treat symptoms of menopause. Follow these instructions at home: Lifestyle Do not use any products that contain nicotine or tobacco, such as cigarettes, e-cigarettes, and chewing tobacco. If you need help quitting, ask your health care provider. Do not use street drugs. Do not share needles. Ask your health care provider for help if you need support or information about quitting drugs. Alcohol use Do not drink alcohol if: Your health care provider tells you not to drink. You are pregnant, may be pregnant, or are planning to become pregnant. If you drink alcohol: Limit how much you use to 0-1 drink a day. Limit intake if you are breastfeeding. Be aware of how much alcohol is in your drink. In the [place] one drink equals one 12 oz bottle of beer (355 mL), one 5 oz glass of wine (148 mL), or one 1 oz glass of hard liquor (44 mL). General instructions Schedule regular health, dental, and eye exams. Stay current with your vaccines. Tell your health care provider if: You often feel depressed. You have ever been  abused or do not feel safe at home. Summary Adopting a healthy lifestyle and getting preventive care are important in promoting health and wellness. Follow your health care provider's instructions about healthy diet, exercising, and getting tested or screened for diseases. Follow your health care provider's instructions on monitoring your cholesterol and blood pressure. This information is not intended to replace advice given to you by your health care provider. Make sure you discuss any questions you have with your healthcare provider. Document Revised: 02/06/2018 Document Reviewed: 02/06/2018 Elsevier Patient Education  2022 [employer].

## 2020-10-21 DIAGNOSIS — Z01818 Encounter for other preprocedural examination: Secondary | ICD-10-CM | POA: Insufficient documentation

## 2020-10-21 NOTE — Assessment & Plan Note (Signed)
Chronic, stable on allopurinol daily.

## 2020-10-21 NOTE — Assessment & Plan Note (Addendum)
Stable period on low dose sertraline 25mg .

## 2020-10-21 NOTE — Assessment & Plan Note (Addendum)
RCRI = 0 EKG today without acute finding. Pending CXR.  No known history of CHF, insulin use, CKD, ischemic heart disease or cerebrovascular disease.  Anticipate adequately low risk to proceed with planned surgery.  No noted contra-indications. No need to hold any medication perioperatively. She has OSA on CPAP but no planned overnight stay.

## 2020-10-21 NOTE — Assessment & Plan Note (Signed)
Chronic, stable on current regimen - continue. 

## 2020-10-21 NOTE — Assessment & Plan Note (Signed)
A1c remains in prediabetes range.

## 2020-10-21 NOTE — Assessment & Plan Note (Signed)
Ongoing struggle to lose weight. Worse with current ankle pain.

## 2020-10-21 NOTE — Assessment & Plan Note (Signed)
No memory concerns identified - continues namenda 10mg  bid.

## 2020-10-21 NOTE — Assessment & Plan Note (Signed)
Chronic. b12 normal. Prior eval including folate, peripheral smear all normal. Consider copper level, consider heme eval.

## 2020-10-21 NOTE — Assessment & Plan Note (Signed)
Advanced directives: mom would be HCPOA but this has not been set up yet - encouraged they work on this.  

## 2020-10-21 NOTE — Assessment & Plan Note (Addendum)
Lives with parents

## 2020-10-21 NOTE — Assessment & Plan Note (Signed)
Chronic R ankle instability with recurrent sprains in known DJD, has upcoming ankle surgery planned next month (Dr Logan Bores) to entail arthroscopy and lateral collateral ligament stabilization using Arthrex internal brace.  Needs preop clearance today - see below.

## 2020-10-21 NOTE — Assessment & Plan Note (Signed)
Isolate seizure during hospitalization for acute illness 2003, continues keppra, followed by neurology.

## 2020-10-22 ENCOUNTER — Telehealth: Payer: Self-pay | Admitting: Family Medicine

## 2020-10-22 NOTE — Telephone Encounter (Signed)
Plz call - I'm still waiting on chest xray to give clearance for upcoming ankle surgery.  Letter written and in Lisa's box.

## 2020-10-22 NOTE — Telephone Encounter (Signed)
Called patient's mother and she stated that she will be calling them today and trying to either get in today or tomorrow and have it done before Monday morning.

## 2020-10-25 ENCOUNTER — Ambulatory Visit
Admission: RE | Admit: 2020-10-25 | Discharge: 2020-10-25 | Disposition: A | Discharge status: Home / Self Care | Payer: Medicare PPO | Source: Ambulatory Visit | Attending: Family Medicine | Admitting: Family Medicine

## 2020-10-25 ENCOUNTER — Other Ambulatory Visit: Payer: Self-pay

## 2020-10-25 DIAGNOSIS — Z01818 Encounter for other preprocedural examination: Secondary | ICD-10-CM

## 2020-10-25 DIAGNOSIS — I517 Cardiomegaly: Secondary | ICD-10-CM | POA: Diagnosis not present

## 2020-10-27 ENCOUNTER — Encounter: Payer: Self-pay | Admitting: Family Medicine

## 2020-10-27 DIAGNOSIS — I517 Cardiomegaly: Secondary | ICD-10-CM | POA: Insufficient documentation

## 2020-11-10 ENCOUNTER — Encounter: Payer: Medicare PPO | Admitting: Podiatry

## 2020-11-11 ENCOUNTER — Other Ambulatory Visit: Payer: Self-pay | Admitting: Podiatry

## 2020-11-11 DIAGNOSIS — M25571 Pain in right ankle and joints of right foot: Secondary | ICD-10-CM | POA: Diagnosis not present

## 2020-11-11 DIAGNOSIS — M25371 Other instability, right ankle: Secondary | ICD-10-CM | POA: Diagnosis not present

## 2020-11-11 DIAGNOSIS — M7751 Other enthesopathy of right foot: Secondary | ICD-10-CM | POA: Diagnosis not present

## 2020-11-11 DIAGNOSIS — M65871 Other synovitis and tenosynovitis, right ankle and foot: Secondary | ICD-10-CM | POA: Diagnosis not present

## 2020-11-11 HISTORY — PX: ANKLE ARTHROSCOPY: SUR85

## 2020-11-11 MED ORDER — IBUPROFEN 800 MG PO TABS: 800.0000 mg | ORAL_TABLET | Freq: Three times a day (TID) | ORAL | 1 refills | Status: DC

## 2020-11-11 MED ORDER — OXYCODONE-ACETAMINOPHEN 5-325 MG PO TABS: 1.0000 | ORAL_TABLET | ORAL | 0 refills | Status: DC | PRN

## 2020-11-11 NOTE — Progress Notes (Signed)
PRN postop 

## 2020-11-17 ENCOUNTER — Ambulatory Visit (INDEPENDENT_AMBULATORY_CARE_PROVIDER_SITE_OTHER): Payer: Medicare PPO

## 2020-11-17 ENCOUNTER — Ambulatory Visit (INDEPENDENT_AMBULATORY_CARE_PROVIDER_SITE_OTHER): Payer: Medicare PPO | Admitting: Podiatry

## 2020-11-17 ENCOUNTER — Other Ambulatory Visit: Payer: Self-pay

## 2020-11-17 ENCOUNTER — Encounter: Payer: Medicare PPO | Admitting: Podiatry

## 2020-11-17 DIAGNOSIS — Z9889 Other specified postprocedural states: Secondary | ICD-10-CM

## 2020-11-19 NOTE — Progress Notes (Signed)
   Subjective:  Patient presents today status post right ankle arthroscopy with lateral stabilization using an Arthrex internal brace. DOS: 11/11/2020.  Patient states that she is doing well.  She has been taking the ibuprofen.  She no longer needs to take the any of the opioid pain medication.  She denies any fever chills nausea vomiting shortness of breath or chest pain.  Past Medical History:  Diagnosis Date   Dementia due to another medical condition (HCC)    Trisomy 21-related, sees Sethi yearly, nl CT head 2010, did not tolerate aricept   Depression    with OCD   Diabetes mellitus (HCC)    "just when she was on TPN"   DJD (degenerative joint disease) of knee    bilat knee arthritis, had 3/3 Supartz injections [name])   Down syndrome    Dry ARMD 02/2013   Eczema    Empyema of left pleural space (HCC) 09/24/2011   Hypothyroid    OCD (obsessive compulsive disorder)    OSA on CPAP 05/2012   severe OSA with AHI 118, desat nadir to 67%   Pneumonia    multiple pneumonias   Presenile dementia, uncomplicated (HCC)    Seizure (HCC) 2013   x 2 ; "very mild"; during hospitalization "while coming off vent"      Objective/Physical Exam Neurovascular status intact.  Skin incisions appear to be well coapted with sutures intact. No sign of infectious process noted. No dehiscence. No active bleeding noted. Moderate edema noted to the surgical extremity.  Radiographic Exam:  Normal osseous mineralization.  No fractures identified.  Joint spaces mostly preserved  Assessment: 1. s/p right ankle arthroscopy with lateral collateral ligament stabilization. DOS: 11/11/2020   Plan of Care:  1. Patient was evaluated. X-rays reviewed 2.  Dressings changed today.  Keep clean dry and intact x1 week 3.  Continue weightbearing in the cam boot as tolerated 4.  Return to clinic in 1 week for suture removal  Felecia Shelling, DPM [facility]  Dr. Felecia Shelling, DPM    [address]  [place], [place] [address]                Office [phone]  Fax [phone]

## 2020-11-25 ENCOUNTER — Ambulatory Visit (INDEPENDENT_AMBULATORY_CARE_PROVIDER_SITE_OTHER): Payer: Medicare PPO | Admitting: Podiatrist

## 2020-11-25 ENCOUNTER — Encounter: Payer: Self-pay | Admitting: Podiatrist

## 2020-11-25 ENCOUNTER — Other Ambulatory Visit: Payer: Self-pay

## 2020-11-25 DIAGNOSIS — Z9889 Other specified postprocedural states: Secondary | ICD-10-CM

## 2020-11-25 MED ORDER — LIDOCAINE-PRILOCAINE 2.5-2.5 % EX CREA: 1.0000 "application " | TOPICAL_CREAM | CUTANEOUS | 0 refills | Status: DC | PRN

## 2020-11-25 NOTE — Progress Notes (Signed)
Chief Complaint  Patient presents with   Routine Post Op    POV #2 DOS 11/11/2020 ANKLE ARTHROSCOPY RT, REPAIR LATERAL COLLATERAL LIGAMENT RT. Pt states she is doing well. No NV,F,CH. Pt wants to know if she can take the AFW off while sleeping now. No other concerns at this moment.      Subjective: Patient presents today2 weeks status post foot surgery of the right ankle.   Patient denies nausea, vomiting, fevers, chills or night sweats.  Denies calf pain or tenderness to the operative side. She relates she has been wearing the boot as instructed.   Objective:  Neurovascular status is intact with palpable pedal pulses DP and PT at 2+ out of 4 right foot.  Negative homans sign noted.  Neurological sensation is intact and unchanged as per prior to surgery. Excellent appearance of the postoperative foot is noted.  Dressing is intact upon presenting to the office and once removed sutures are noted to be in place with incision sites healing well from the arthroscopy and lateral ankle.  Some swelling along the suture line of the lateral ankle is noted making the sutures painful to remove today.  Assessment: Status post right ankle arthroscopy and repair of lateral ankle ligaments  Plan: Suture removal was planned for today's visit.  I was able to remove the sutures from the arthroscopy incision site.  The lateral ankle suture line was a little swollen around the sutures and the patient was significantly uncomfortable when attempting to remove the sutures.  I recommended she wait a week for the swelling to reduce and be seen back for removal at that time. I placed a fluffy compressive dressing on the foot to help reduce the swelling.    I also wrote a prescription for Emla cream to apply to the suture line 30 minutes prior to her appointment and when she arrives in the treatment room to have the sutures removed.  Hopefully this will help reduce the pain she was experiencing at today's visit.  She will  follow-up with Dr. Logan Bores in 1 week.  She also asked if she could take the boot off at night to sleep and I did agree this would be okay.  She is to continue wearing the boot at any times when her foot will be on the floor when walking.

## 2020-11-29 ENCOUNTER — Encounter: Payer: Medicare PPO | Admitting: Podiatry

## 2020-11-30 ENCOUNTER — Telehealth: Payer: Self-pay | Admitting: Podiatry

## 2020-11-30 NOTE — Telephone Encounter (Signed)
She can just follow up with me next week. - Dr. Logan Bores

## 2020-11-30 NOTE — Telephone Encounter (Signed)
Patient missed her post op yesterday. would you like her to see another provider as soon as she can be worked in the [employer] office? please advise. thanks!

## 2020-11-30 NOTE — Telephone Encounter (Signed)
Either way is fine. She can wait for an additional week or see another provider this week. Thanks, Dr. Logan Bores

## 2020-11-30 NOTE — Telephone Encounter (Signed)
Patient missed her post op yesterday. would you like her to see another provider as soon as she can be worked in the [place] office? please advise. thanks!    

## 2020-12-01 NOTE — Telephone Encounter (Signed)
Patient has been scheduled to see you Monday 10/10. Thanks!

## 2020-12-06 ENCOUNTER — Other Ambulatory Visit: Payer: Self-pay

## 2020-12-06 ENCOUNTER — Encounter: Payer: Medicare PPO | Admitting: Podiatry

## 2020-12-06 ENCOUNTER — Ambulatory Visit (INDEPENDENT_AMBULATORY_CARE_PROVIDER_SITE_OTHER): Payer: Medicare PPO | Admitting: Podiatry

## 2020-12-06 DIAGNOSIS — Z9889 Other specified postprocedural states: Secondary | ICD-10-CM

## 2020-12-06 MED ORDER — DOXYCYCLINE HYCLATE 100 MG PO TABS: 100.0000 mg | ORAL_TABLET | Freq: Two times a day (BID) | ORAL | 0 refills | Status: DC

## 2020-12-06 MED ORDER — GENTAMICIN SULFATE 0.1 % EX CREA: 1.0000 "application " | TOPICAL_CREAM | Freq: Three times a day (TID) | CUTANEOUS | 0 refills | Status: DC

## 2020-12-13 ENCOUNTER — Encounter: Payer: Medicare PPO | Admitting: Podiatry

## 2020-12-19 NOTE — Progress Notes (Signed)
   Subjective:  Patient presents today status post right ankle arthroscopy with lateral stabilization using an Arthrex internal brace. DOS: 11/11/2020.  Overall the patient is doing well.  No new complaints at this time  Past Medical History:  Diagnosis Date   Dementia due to another medical condition (HCC)    Trisomy 21-related, sees Sethi yearly, nl CT head 2010, did not tolerate aricept   Depression    with OCD   Diabetes mellitus (HCC)    "just when she was on TPN"   DJD (degenerative joint disease) of knee    bilat knee arthritis, had 3/3 Supartz injections [name])   Down syndrome    Dry ARMD 02/2013   Eczema    Empyema of left pleural space (HCC) 09/24/2011   Hypothyroid    OCD (obsessive compulsive disorder)    OSA on CPAP 05/2012   severe OSA with AHI 118, desat nadir to 67%   Pneumonia    multiple pneumonias   Presenile dementia, uncomplicated (HCC)    Seizure (HCC) 2013   x 2 ; "very mild"; during hospitalization "while coming off vent"      Objective/Physical Exam Neurovascular status intact.  Skin incisions appear to be well coapted with sutures intact however there is some small erythema intermittently throughout the incision site with some mild drainage.  No dehiscence.  No active bleeding noted.. Moderate edema noted to the surgical extremity.  Assessment: 1. s/p right ankle arthroscopy with lateral collateral ligament stabilization. DOS: 11/11/2020   Plan of Care:  1. Patient was evaluated. 2.  Sutures removed today.  After removal of the sutures there was some small little areas of erythema and purulence localized superficially along the incision site 3.  Prescription for doxycycline 100 mg 2 times daily #20 4.  Prescription for gentamicin cream to apply over the incision site daily 5.  Patient may now transition out of the cam boot into good supportive sneakers and shoes 6.  Return to clinic in 3 weeks  Felecia Shelling, DPM [facility]  Dr.  Felecia Shelling, DPM    [address] [place], [place] [address]                Office [phone]  Fax [phone]

## 2020-12-22 ENCOUNTER — Telehealth: Payer: Self-pay | Admitting: Podiatry

## 2020-12-22 ENCOUNTER — Other Ambulatory Visit: Payer: Self-pay | Admitting: Podiatry

## 2020-12-22 MED ORDER — DOXYCYCLINE HYCLATE 100 MG PO TABS: 100.0000 mg | ORAL_TABLET | Freq: Two times a day (BID) | ORAL | 0 refills | Status: DC

## 2020-12-22 NOTE — Telephone Encounter (Signed)
I called Ms.Schou's mother back and let her know her RX has been sent

## 2020-12-22 NOTE — Telephone Encounter (Signed)
Patient's mother called the office stating that Ms.Ragas right foot is swollen around her incision site and she was concerned about it , she wanted to know if you would sent in another antibiotic?    Please advise.

## 2020-12-22 NOTE — Telephone Encounter (Signed)
I sent in a refill of the doxycycline.  Please notify patient.  Thanks, Dr. Logan Bores

## 2020-12-29 ENCOUNTER — Other Ambulatory Visit: Payer: Self-pay

## 2020-12-29 ENCOUNTER — Encounter: Payer: Self-pay | Admitting: Podiatry

## 2020-12-29 ENCOUNTER — Ambulatory Visit (INDEPENDENT_AMBULATORY_CARE_PROVIDER_SITE_OTHER): Payer: Medicare PPO | Admitting: Podiatry

## 2020-12-29 DIAGNOSIS — Z9889 Other specified postprocedural states: Secondary | ICD-10-CM

## 2020-12-29 NOTE — Progress Notes (Signed)
   Subjective:  Patient presents today status post right ankle arthroscopy with lateral stabilization using an Arthrex internal brace. DOS: 11/11/2020.  Overall the patient is doing well.  Patient is wearing tennis shoes today.  She says that she feels much better and she has not rolled her ankle since the surgery.  Past Medical History:  Diagnosis Date   Dementia due to another medical condition (HCC)    Trisomy 21-related, sees Sethi yearly, nl CT head 2010, did not tolerate aricept   Depression    with OCD   Diabetes mellitus (HCC)    "just when she was on TPN"   DJD (degenerative joint disease) of knee    bilat knee arthritis, had 3/3 Supartz injections [name])   Down syndrome    Dry ARMD 02/2013   Eczema    Empyema of left pleural space (HCC) 09/24/2011   Hypothyroid    OCD (obsessive compulsive disorder)    OSA on CPAP 05/2012   severe OSA with AHI 118, desat nadir to 67%   Pneumonia    multiple pneumonias   Presenile dementia, uncomplicated (HCC)    Seizure (HCC) 2013   x 2 ; "very mild"; during hospitalization "while coming off vent"      Objective/Physical Exam Neurovascular status intact.  Skin incisions appear to be well coapted and healed.  There was a small remaining nonabsorbable suture along the incision site which was identified today.  No dehiscence.  No active bleeding noted..  Chronic moderate edema noted to the surgical extremity.  Assessment: 1. s/p right ankle arthroscopy with lateral collateral ligament stabilization. DOS: 11/11/2020   Plan of Care:  1. Patient was evaluated. 2.  There was a remaining stitch that was left in the incision site.  That was removed today. 3.  Continue antibiotic cream and a light Band-Aid daily for an additional week over the newly healed incision site 4.  Continue oral doxycycline until completed 5.  Patient may now slowly increase to full activity no restrictions 6.  Return to clinic as needed  Felecia Shelling, DPM [facility]  Dr. Felecia Shelling, DPM    [address] [place], [place] [address]                Office [phone]  Fax [phone]

## 2021-01-03 ENCOUNTER — Other Ambulatory Visit: Payer: Self-pay | Admitting: Podiatry

## 2021-01-04 ENCOUNTER — Ambulatory Visit: Payer: Medicare PPO | Admitting: Adult Health

## 2021-01-05 ENCOUNTER — Other Ambulatory Visit: Payer: Self-pay | Admitting: Family Medicine

## 2021-01-05 ENCOUNTER — Ambulatory Visit: Payer: Medicare PPO | Admitting: Adult Health

## 2021-01-05 NOTE — Telephone Encounter (Signed)
Refill request Temovate cream Last refill 02/03/20  45 G Last office visit 10/18/20

## 2021-01-07 ENCOUNTER — Other Ambulatory Visit: Payer: Self-pay | Admitting: Family Medicine

## 2021-01-24 DIAGNOSIS — H6123 Impacted cerumen, bilateral: Secondary | ICD-10-CM | POA: Diagnosis not present

## 2021-03-03 ENCOUNTER — Other Ambulatory Visit: Payer: Self-pay | Admitting: Family Medicine

## 2021-03-03 ENCOUNTER — Other Ambulatory Visit: Payer: Self-pay | Admitting: Adult Health

## 2021-03-09 DIAGNOSIS — M1712 Unilateral primary osteoarthritis, left knee: Secondary | ICD-10-CM | POA: Diagnosis not present

## 2021-03-10 ENCOUNTER — Other Ambulatory Visit: Payer: Self-pay | Admitting: Podiatry

## 2021-03-10 ENCOUNTER — Telehealth: Payer: Self-pay | Admitting: Family Medicine

## 2021-03-11 NOTE — Telephone Encounter (Signed)
Clobetasol cream Last filled:  01/10/21, #45g Last OV:  10/18/20, AWV Next OV:  none

## 2021-03-14 ENCOUNTER — Ambulatory Visit: Payer: Medicare PPO | Admitting: Podiatry

## 2021-03-16 DIAGNOSIS — M1712 Unilateral primary osteoarthritis, left knee: Secondary | ICD-10-CM | POA: Diagnosis not present

## 2021-03-23 DIAGNOSIS — M1712 Unilateral primary osteoarthritis, left knee: Secondary | ICD-10-CM | POA: Diagnosis not present

## 2021-03-24 MED ORDER — ALLOPURINOL 100 MG PO TABS: 100.0000 mg | ORAL_TABLET | Freq: Every day | ORAL | 3 refills | Status: DC

## 2021-03-24 NOTE — Telephone Encounter (Signed)
°  Encourage patient to contact the pharmacy for refills or they can request refills through [facility]  LAST APPOINTMENT DATE:  Please schedule appointment if longer than 1 year  NEXT APPOINTMENT DATE:  MEDICATION:allopurinol (ZYLOPRIM) 100 MG tablet  Is the patient out of medication?   PHARMACY: [id number] - [place], [place] -     Let patient know to contact pharmacy at the end of the day to make sure medication is ready.  Please notify patient to allow 48-72 hours to process  CLINICAL FILLS OUT ALL BELOW:   LAST REFILL:  QTY:  REFILL DATE:    OTHER COMMENTS:    Okay for refill?  Please advise

## 2021-03-24 NOTE — Telephone Encounter (Signed)
Refills sent as requested

## 2021-03-30 ENCOUNTER — Other Ambulatory Visit: Payer: Self-pay | Admitting: Family Medicine

## 2021-03-30 ENCOUNTER — Other Ambulatory Visit: Payer: Self-pay | Admitting: Adult Health

## 2021-03-30 ENCOUNTER — Ambulatory Visit (INDEPENDENT_AMBULATORY_CARE_PROVIDER_SITE_OTHER): Payer: Medicare PPO | Admitting: Podiatry

## 2021-03-30 ENCOUNTER — Other Ambulatory Visit: Payer: Self-pay

## 2021-03-30 DIAGNOSIS — M79674 Pain in right toe(s): Secondary | ICD-10-CM | POA: Diagnosis not present

## 2021-03-30 DIAGNOSIS — M79675 Pain in left toe(s): Secondary | ICD-10-CM | POA: Diagnosis not present

## 2021-03-30 DIAGNOSIS — M1712 Unilateral primary osteoarthritis, left knee: Secondary | ICD-10-CM | POA: Diagnosis not present

## 2021-03-30 DIAGNOSIS — B351 Tinea unguium: Secondary | ICD-10-CM | POA: Diagnosis not present

## 2021-03-30 NOTE — Progress Notes (Signed)
° °  SUBJECTIVE Patient presents to office today complaining of elongated, thickened nails that cause pain while ambulating in shoes.  Patient is unable to trim their own nails. Patient is here for further evaluation and treatment.  Past Medical History:  Diagnosis Date   Dementia due to another medical condition (HCC)    Trisomy 21-related, sees Sethi yearly, nl CT head 2010, did not tolerate aricept   Depression    with OCD   Diabetes mellitus (HCC)    "just when she was on TPN"   DJD (degenerative joint disease) of knee    bilat knee arthritis, had 3/3 Supartz injections [name])   Down syndrome    Dry ARMD 02/2013   Eczema    Empyema of left pleural space (HCC) 09/24/2011   Hypothyroid    OCD (obsessive compulsive disorder)    OSA on CPAP 05/2012   severe OSA with AHI 118, desat nadir to 67%   Pneumonia    multiple pneumonias   Presenile dementia, uncomplicated (HCC)    Seizure (HCC) 2013   x 2 ; "very mild"; during hospitalization "while coming off vent"    OBJECTIVE General Patient is awake, alert, and oriented x 3 and in no acute distress. Derm Skin is dry and supple bilateral. Negative open lesions or macerations. Remaining integument unremarkable. Nails are tender, long, thickened and dystrophic with subungual debris, consistent with onychomycosis, 1-5 bilateral. No signs of infection noted. Vasc  DP and PT pedal pulses palpable bilaterally. Temperature gradient within normal limits.  Neuro Epicritic and protective threshold sensation grossly intact bilaterally.  Musculoskeletal Exam No symptomatic pedal deformities noted bilateral. Muscular strength within normal limits.  ASSESSMENT 1.  Pain due to onychomycosis of toenails both  PLAN OF CARE 1. Patient evaluated today.  2. Instructed to maintain good pedal hygiene and foot care.  3. Mechanical debridement of nails 1-5 bilaterally performed using a nail nipper. Filed with dremel without incident.  4. Return to clinic  in 3 mos.    Felecia Shelling, DPM [facility]  Dr. Felecia Shelling, DPM    [address] [place], [place] [address]                Office [phone]  Fax [phone]

## 2021-03-31 ENCOUNTER — Encounter: Payer: Self-pay | Admitting: Adult Health

## 2021-03-31 ENCOUNTER — Ambulatory Visit (INDEPENDENT_AMBULATORY_CARE_PROVIDER_SITE_OTHER): Payer: Medicare PPO | Admitting: Adult Health

## 2021-03-31 ENCOUNTER — Telehealth: Payer: Self-pay | Admitting: Podiatry

## 2021-03-31 VITALS — BP 118/76 | HR 64 | Ht <= 58 in | Wt 249.2 lb

## 2021-03-31 DIAGNOSIS — Q909 Down syndrome, unspecified: Secondary | ICD-10-CM

## 2021-03-31 DIAGNOSIS — F0280 Dementia in other diseases classified elsewhere without behavioral disturbance: Secondary | ICD-10-CM | POA: Diagnosis not present

## 2021-03-31 DIAGNOSIS — R569 Unspecified convulsions: Secondary | ICD-10-CM

## 2021-03-31 DIAGNOSIS — F330 Major depressive disorder, recurrent, mild: Secondary | ICD-10-CM | POA: Diagnosis not present

## 2021-03-31 MED ORDER — LEVETIRACETAM 500 MG PO TABS: 500.0000 mg | ORAL_TABLET | Freq: Two times a day (BID) | ORAL | 3 refills | Status: DC

## 2021-03-31 MED ORDER — MEMANTINE HCL 10 MG PO TABS: 10.0000 mg | ORAL_TABLET | Freq: Two times a day (BID) | ORAL | 3 refills | Status: DC

## 2021-03-31 MED ORDER — SERTRALINE HCL 50 MG PO TABS: 50.0000 mg | ORAL_TABLET | Freq: Every day | ORAL | 11 refills | Status: DC

## 2021-03-31 NOTE — Telephone Encounter (Signed)
Patient mother called she would like you to put a referral in for [facility] in [place].  Please Advise

## 2021-03-31 NOTE — Progress Notes (Signed)
[facility]  PATIENT: [name] DOB: 1970/05/18   REASON FOR VISIT: Follow-up for seizure disorder, [name] and memory loss HISTORY FROM: Patient and mom   Chief Complaint  Patient presents with   Seizures    Rm 3, mom- [name]  "can setraline be increased a little"      HISTORY OF PRESENT ILLNESS:  Update 03/31/2021 JM: Returns today for overdue seizure follow-up as well as underlying cognitive impairment with history of Down syndrome.  She is accompanied by her mother, [name].  Previously seen approx 9 months ago. Stable from seizure standpoint, no witnessed seizure activity, compliant on Keppra 500 mg twice daily, denies side effects. At prior visit, concern of underlying depression contributing to cognitive decline, started sertraline 25mg  nightly initially with great improvement in regards to depression and stabilizing cognition but over past 2-3 months, has been having increased outbursts, tearfulness and cognitive decline.  Compliant on Namenda 10 mg twice daily, denies side effects.  No further concerns at this time.     History provided for reference purposes only Update 07/01/2020 JM: [name] returns for yearly follow-up with history of seizure disorder and cognitive impairment with history of Down syndrome.  She is accompanied by her mother, Christell Sosa from a seizure standpoint without seizure activity and remains on Keppra 500 mg twice daily without side effects.   Cognition has been gradually declining and over the past year increased behavioral concerns including agitation, outbursts, sundowning, sleep-wake disturbance and slower movements.  She has remained on Namenda 10 mg twice daily tolerating without side effects.  Possible depression as mother will find her upset and crying occasionally.  She continues to live with her mother who is her full-time caregiver.  No new concerns at this time  Update 03/03/2019 JM: [name] is a  51 year old female who is being seen today via virtual visit for 1 year follow-up with history of seizure disorder, Down syndrome and memory loss.  She remains on Keppra 500 mg twice daily tolerating well without recent seizure activity.  Continues on Namenda 10 mg twice daily and memory has been stable.  She also has a history of OSA and continues use of CPAP with ongoing follow-up by Dr. 52.  She is currently prescribed multiple as needed pain medications such as tramadol, naproxen and meloxicam due to ongoing knee pain unable to undergo knee replacement due to being overweight.  Continues to attempt weight loss but due to limited mobility, this has been difficult.  No further concerns at this time.  UPDATE 12/30/2019CM [name], 51 year old female returns for follow-up with history of seizure disorder Down syndrome and memory loss.  She also has a history of obstructive sleep apnea but her CPAP is followed by Dr. 57.  She remains on Keppra 500 twice daily without further seizure activity.  She is on Namenda 10 mg twice daily and her memory is stable according to the mother.  She is having problems with her left knee and needs to have knee replacement however she needs to lose weight prior to the surgery according to mom.  She no longer goes to adult daycare but helps her mom around the house.  No behavior issues..  She returns for reevaluation  UPDATE 12/26/2018CM [name], 51 year old female returns for follow-up with a history of Down syndrome and cognitive decline over several years.  She also has a seizure disorder which is currently well controlled on Keppra 500 mg twice daily.  She  remains on Namenda 10 mg twice daily.  She gets little exercise and is overweight.  She continues to go to adult care 5 days a week.  She has a history of obstructive sleep apnea and is on CPAP with additional oxygen at night.  No recent behavior issues.  She has a history of gout.  She had recent ankle surgery for a  torn tendon and is wearing a boot on the right foot.  No recent behavior issues.  Returns for reevaluation  UPDATE 12/15/2015 CM [name], 51 year old female returns for followup with her mother. She has a history of Downs syndrome and  cognitive decline over several years as well as seizure disorder. She is currently well controlled on Keppra and she is on Namenda without side effects to either drug. She is overweight, gets little exercise, lives at home. She goes to adult daycare 5 days a week . She has been having a lot of abdominal pain and diarrhea, she has appointment with GI in December.  She occasionally has behavior problems and was recently placed on Zoloft however it made her irritability worse and she is slowly titrating off this. She needs refills on Keppra and Namenda . She returns for reevaluation  Update 10/14/16PS : She returns for follow-up after last visit a year ago. She is a complaint by sister who provides most of the history. She continues to do well and has not had any breakthrough seizures now for a few years. Her memory and cognitive difficulties remain unchanged. She requires close supervision at home and does spend the day at the daycare center. She can feed dress herself and bathe herself. She is never left alone. There have been no issues with agitation, behavior, delusions or hallucinations or safety concerns. Her gait imbalance remained stable and she has had no falls.  HISTORY: [name] is a 51 year year old Caucasian lady with lifelong history of Down`s syndrome and cognitive decline in last several years from dementia who is referred back to see need for a new problem of seizures. She had recent prolonged hospital stay [facility] following pneumonia with empyema and ventilator dependent respiratory failure. She underwent video assisted thoracotomy for empyma drainage. She required prolonged antibiotics and was eventually required  tracheostomy. She had 2 brief episodes of what sound like complex partial seizures one week apart in September 2013. Her mother who is present today was eyewitness to both episodes. She was described as staring and unresponsive not following commands with a distant look on her face lasting a few minutes followed by a period of disorientation and tiredness. In the second episode she had some clonic activity in both her upper extremities. She was started on Keppra 500 twice a day falling and abnormal EEG on 11/04/2011 and with showed sharp waves in the left occipital parietal region. She was initially off her Namenda and Prozac in the hospital stay and had some behavioral agitation but settled down after the medicines were restarted. She has done well since discharge and has not had any more seizures. She seems to be tolerating Keppra quite well without any side effects. She is finished a course of antibiotics and home physical and occupation therapy. She is back living at home with mom and plans to start attending adult daycare soon. She apparently did not have any brain imaging study done.  03/11/12: [name] returns for followup. She was last seen by Dr. [name] 12/11/11: She had prolonged hospitilization  in Sept 2013 and had seizure events. She was started on Keppra at that time. No further seizure events. MRI of the brain 12/18/11 was normal. EEG was abnormal suggestive of mild focal irritability in right frontal and temporal regions but no definite epileptiform  activity is noted. ROS neg.    REVIEW OF SYSTEMS: Full 14 system review of systems performed and notable only for those listed, all others are neg:  Constitutional: neg  Cardiovascular: neg Ear/Nose/Throat: neg  Skin: neg Eyes: neg Respiratory: neg Gastroitestinal: neg Hematology/Lymphatic: neg  Endocrine: neg Musculoskeletal: BLE edema, pain Allergy/Immunology: neg Neurological: History of seizure disorder, Down syndrome, cognitive  impairment with behaviors Psychiatric: depression, anxiety Sleep : neg   ALLERGIES: No Known Allergies  HOME MEDICATIONS: Outpatient Medications Prior to Visit  Medication Sig Dispense Refill   allopurinol (ZYLOPRIM) 100 MG tablet Take 1 tablet (100 mg total) by mouth daily. 90 tablet 3   Cholecalciferol (VITAMIN D3 PO) Take 500 Units by mouth daily.     clobetasol cream (TEMOVATE) 0.05 % APPLY TO AFFECTED AREA TWICE A DAY. (2 WEEKS AT A TIME) 45 g 0   ibuprofen (ADVIL) 800 MG tablet TAKE 1 TABLET BY MOUTH THREE TIMES A DAY 90 tablet 1   ketoconazole (NIZORAL) 2 % cream Apply 1 application topically 2 (two) times daily as needed for irritation. 60 g 1   ketoconazole (NIZORAL) 2 % shampoo      levETIRAcetam (KEPPRA) 500 MG tablet Take 1 tablet (500 mg total) by mouth 2 (two) times daily. 180 tablet 3   levothyroxine (SYNTHROID) 125 MCG tablet TAKE 1 TABLET BY MOUTH EVERY DAY BEFORE BREAKFAST 90 tablet 1   memantine (NAMENDA) 10 MG tablet Take 1 tablet (10 mg total) by mouth 2 (two) times daily. 180 tablet 3   Multiple Vitamins-Minerals (WOMENS MULTI PO) Take 1 tablet by mouth daily.      oxyCODONE-acetaminophen (PERCOCET) 5-325 MG tablet Take 1 tablet by mouth every 4 (four) hours as needed for severe pain. 30 tablet 0   sertraline (ZOLOFT) 25 MG tablet TAKE 1 TABLET BY MOUTH EVERYDAY AT BEDTIME 90 tablet 1   traMADol (ULTRAM) 50 MG tablet Take 1 tablet (50 mg total) by mouth 3 (three) times daily as needed for moderate pain. 30 tablet 0   triamcinolone ointment (KENALOG) 0.1 % APPLY TO THE BODY NIGHTLY (NEVER THE FACE OR GROIN)     colchicine 0.6 MG tablet TAKE 1 TABLET BY MOUTH EVERY DAY AS NEEDED FOR GOUT FLARE (Patient not taking: Reported on 03/31/2021) 30 tablet 5   gentamicin cream (GARAMYCIN) 0.1 % Apply 1 application topically 3 (three) times daily. (Patient not taking: Reported on 03/31/2021) 15 g 0   meloxicam (MOBIC) 15 MG tablet TAKE 1 TABLET BY MOUTH EVERY DAY IN THE MORNING WITH  FOOD FOR PAIN (Patient not taking: Reported on 03/31/2021) 30 tablet 0   acetaminophen (TYLENOL) 500 MG tablet Take 1 tablet (500 mg total) by mouth in the morning, at noon, and at bedtime.     albuterol (PROVENTIL) (2.5 MG/3ML) 0.083% nebulizer solution USE 1 VIAL VIA NEBULIZER EVERY 6 HOURS AS NEEDED FOR WHEEZING OR SHORTNESS OF BREATH 75 mL 3   doxycycline (VIBRA-TABS) 100 MG tablet Take 1 tablet (100 mg total) by mouth 2 (two) times daily. 20 tablet 0   lidocaine-prilocaine (EMLA) cream Apply 1 application topically as needed. Apply to ankle incision 30 minutes prior to appointment for suture removal-  bring to office and apply again when in  exam room. 30 g 0   No facility-administered medications prior to visit.    PAST MEDICAL HISTORY: Past Medical History:  Diagnosis Date   Dementia due to another medical condition (HCC)    Trisomy 21-related, sees Sethi yearly, nl CT head 2010, did not tolerate aricept   Depression    with OCD   Diabetes mellitus (HCC)    "just when she was on TPN"   DJD (degenerative joint disease) of knee    bilat knee arthritis, had 3/3 Supartz injections [name])   Down syndrome    Dry ARMD 02/2013   Eczema    Empyema of left pleural space (HCC) 09/24/2011   Hypothyroid    OCD (obsessive compulsive disorder)    OSA on CPAP 05/2012   severe OSA with AHI 118, desat nadir to 67%   Pneumonia    multiple pneumonias   Presenile dementia, uncomplicated (HCC)    Seizure (HCC) 2013   x 2 ; "very mild"; during hospitalization "while coming off vent"    PAST SURGICAL HISTORY: Past Surgical History:  Procedure Laterality Date   ACHILLES TENDON REPAIR Right 01/2017   [facility]   ANTERIOR CRUCIATE LIGAMENT REPAIR Left    FLEXIBLE BRONCHOSCOPY  09/24/2011   Procedure: FLEXIBLE BRONCHOSCOPY;  Surgeon: Purcell Nails, MD;  Location: [facility] OR;  Service: Thoracic;  Laterality: N/A;   LUMBAR DISC SURGERY     ruptured disc   TRACHEOSTOMY TUBE PLACEMENT   10/11/2011   Procedure: TRACHEOSTOMY;  Surgeon: Serena Colonel, MD;  Location: [facility] OR;  Service: ENT;  Laterality: N/A;   VIDEO ASSISTED THORACOSCOPY (VATS) /THOROCOTOMY/RADIOACTIVE SEED IMPLANT Left 09/24/2011   VATS, (Dr. Barry Dienes)    FAMILY HISTORY: Family History  Problem Relation Age of Onset   Arthritis Mother    Hypertension Father    Diabetes Father    Cancer Neg Hx    Coronary artery disease Neg Hx    Stroke Neg Hx     SOCIAL HISTORY: Social History   Socioeconomic History   Marital status: Single    Spouse name: Not on file   Number of children: 0   Years of education: Not on file   Highest education level: Not on file  Occupational History   Not on file  Tobacco Use   Smoking status: Never   Smokeless tobacco: Never  Vaping Use   Vaping Use: Never used  Substance and Sexual Activity   Alcohol use: No   Drug use: No   Sexual activity: Never  Other Topics Concern   Not on file  Social History Narrative   Lives with mom and dad, dogs   Stays at daycare during the day   Activity: walking some   Diet: good water, fruits/vegetables daily   Social Determinants of Health   Financial Resource Strain: Not on file  Food Insecurity: Not on file  Transportation Needs: Not on file  Physical Activity: Not on file  Stress: Not on file  Social Connections: Not on file  Intimate Partner Violence: Not on file     PHYSICAL EXAM Today's Vitals   03/31/21 1331  BP: 118/76  Pulse: 64  Weight: 249 lb 3.2 oz (113 kg)  Height: 4\' 10"  (1.473 m)    Body mass index is 52.08 kg/m.  General: well nourished with Down's features, pleasant middle-age Caucasian female, seated, in no evident distress Head: head normocephalic and atraumatic.     Neurologic Exam Mental Status: Awake and fully alert. Fluent  speech and language with child like quality.  Disoriented to current place and year but able to state her address and birthdate. Recent and remote memory impaired. Attention  span, concentration and fund of knowledge impaired with mother providing history. Mood and affect appropriate.  Cranial Nerves: Pupils equal, briskly reactive to light. Extraocular movements full without nystagmus. Visual fields full to confrontation. Hearing intact. Facial sensation intact. Face, tongue, palate moves normally and symmetrically.  Motor: Normal bulk and tone. Normal strength in all tested extremity muscles Sensory.: intact to touch , pinprick , position and vibratory sensation.  Coordination: Rapid alternating movements normal in all extremities. Finger-to-nose and heel-to-shin performed accurately bilaterally. Gait and Station: Arises from chair without difficulty. Stance is normal. Gait demonstrates  broad-based gait with walking boot right foot without use of assistive device.  Tandem walk and heel toe not attempted.  Reflexes: 1+ and symmetric. Toes downgoing.       DIAGNOSTIC DATA (LABS, IMAGING, TESTING) - I reviewed patient records, labs, notes, testing and imaging myself where available.  Lab Results  Component Value Date   WBC 4.1 10/11/2020   HGB 13.0 10/11/2020   HCT 39.8 10/11/2020   MCV 104.4 (H) 10/11/2020   PLT 209.0 10/11/2020      Component Value Date/Time   NA 140 10/11/2020 0901   K 4.7 10/11/2020 0901   CL 103 10/11/2020 0901   CO2 28 10/11/2020 0901   GLUCOSE 127 (H) 10/11/2020 0901   BUN 14 10/11/2020 0901   CREATININE 1.05 10/11/2020 0901   CALCIUM 8.9 10/11/2020 0901   PROT 6.6 10/11/2020 0901   ALBUMIN 3.7 10/11/2020 0901   AST 20 10/11/2020 0901   ALT 24 10/11/2020 0901   ALKPHOS 55 10/11/2020 0901   BILITOT 0.4 10/11/2020 0901   GFRNONAA >60 10/06/2019 1655   GFRAA >60 10/06/2019 1655    Lab Results  Component Value Date   HGBA1C 6.0 10/11/2020   Lab Results  Component Value Date   VITAMINB12 525 10/11/2020   Lab Results  Component Value Date   TSH 1.83 10/11/2020      ASSESSMENT AND PLAN 51 y.o. year old female   has a past medical history of Downs syndrome; Depression;  Seizure; and memory loss    1.  Seizure disorder -Stable without seizure activity -Continue Keppra 500 mg twice daily for seizure prophylaxis -refill provided  2.  Dementia due to Down syndrome 3. Depression/anxiety -increase sertraline to 50 mg nightly. Advised if no improvement after 3-4 weeks, we can discuss increasing dose or call earlier if any difficulty tolerating -Continue Namenda 10 mg twice daily -refill provided    Follow-up in 1 year or call earlier if needed   CC:  Eustaquio BoydenGutierrez, Javier, MD     I spent 24 minutes of face-to-face and non-face-to-face time with patient.  This included previsit chart review, lab review, study review, order entry, electronic health record documentation, patient and mother education and discussion re: above diagnoses, treatment options and treatment plan and answered all the questions to patient and mother satisfaction   Ihor AustinJessica McCue, [facility] ClinicsGNP-BC  [facility] [address] [place], [place] [id number]  Phone [phone] Fax [phone] Note: This document was prepared with digital dictation and possible smart phrase technology. Any transcriptional errors that result from this process are unintentional.

## 2021-03-31 NOTE — Patient Instructions (Addendum)
Your Plan:  Continue Keppra 500 mg twice daily for seizure prevention  Continue Namenda 10 mg twice daily  Increase sertraline to 50 mg nightly     Follow-up in 1 year or call earlier if needed     Thank you for coming to see [place] at [facility]. I hope we have been able to provide you high quality care today.  You may receive a patient satisfaction survey over the next few weeks. We would appreciate your feedback and comments so that we may continue to improve ourselves and the health of our patients.

## 2021-04-01 ENCOUNTER — Other Ambulatory Visit: Payer: Self-pay | Admitting: Podiatry

## 2021-04-01 DIAGNOSIS — I872 Venous insufficiency (chronic) (peripheral): Secondary | ICD-10-CM

## 2021-04-01 NOTE — Telephone Encounter (Signed)
Referral placed. Please notify patient that they should call them to make an appt. - Dr. Amalia Hailey

## 2021-04-06 NOTE — Telephone Encounter (Signed)
Clobetasol cream Last filled:  03/11/21, #45 g Last OV:  10/18/20, AWV Next OV:  none

## 2021-04-07 NOTE — Telephone Encounter (Signed)
ERx 

## 2021-04-10 ENCOUNTER — Other Ambulatory Visit: Payer: Self-pay | Admitting: Podiatry

## 2021-04-15 ENCOUNTER — Other Ambulatory Visit: Payer: Self-pay | Admitting: Adult Health

## 2021-04-19 ENCOUNTER — Ambulatory Visit (INDEPENDENT_AMBULATORY_CARE_PROVIDER_SITE_OTHER): Payer: Medicare PPO | Admitting: Vascular Surgery

## 2021-04-19 ENCOUNTER — Other Ambulatory Visit: Payer: Self-pay

## 2021-04-19 ENCOUNTER — Encounter (INDEPENDENT_AMBULATORY_CARE_PROVIDER_SITE_OTHER): Payer: Self-pay | Admitting: Vascular Surgery

## 2021-04-19 VITALS — BP 96/66 | HR 70 | Ht <= 58 in | Wt 248.0 lb

## 2021-04-19 DIAGNOSIS — Q909 Down syndrome, unspecified: Secondary | ICD-10-CM | POA: Diagnosis not present

## 2021-04-19 DIAGNOSIS — M7989 Other specified soft tissue disorders: Secondary | ICD-10-CM | POA: Diagnosis not present

## 2021-04-19 DIAGNOSIS — R7303 Prediabetes: Secondary | ICD-10-CM | POA: Diagnosis not present

## 2021-04-19 DIAGNOSIS — Z6841 Body Mass Index (BMI) 40.0 and over, adult: Secondary | ICD-10-CM | POA: Diagnosis not present

## 2021-04-19 NOTE — Assessment & Plan Note (Signed)
blood glucose control important in reducing the progression of atherosclerotic disease. Also, involved in wound healing. On appropriate medications.  

## 2021-04-19 NOTE — Assessment & Plan Note (Signed)

## 2021-04-19 NOTE — Patient Instructions (Signed)
Edema °Edema is an abnormal buildup of fluids in the body tissues and under the skin. Swelling of the legs, feet, and ankles is a common symptom that becomes more likely as you get older. Swelling is also common in looser tissues, like around the eyes. When the affected area is squeezed, the fluid may move out of that spot and leave a dent for a few moments. This dent is called pitting edema. °There are many possible causes of edema. Eating too much salt (sodium) and being on your feet or sitting for a long time can cause edema in your legs, feet, and ankles. Hot weather may make edema worse. Common causes of edema include: °Heart failure. °Liver or kidney disease. °Weak leg blood vessels. °Cancer. °An injury. °Pregnancy. °Medicines. °Being obese. °Low protein levels in the blood. °Edema is usually painless. Your skin may look swollen or shiny. °Follow these instructions at home: °Keep the affected body part raised (elevated) above the level of your heart when you are sitting or lying down. °Do not sit still or stand for long periods of time. °Do not wear tight clothing. Do not wear garters on your upper legs. °Exercise your legs to get your circulation going. This helps to move the fluid back into your blood vessels, and it may help the swelling go down. °Wear elastic bandages or support stockings to reduce swelling as told by your health care provider. °Eat a low-salt (low-sodium) diet to reduce fluid as told by your health care provider. °Depending on the cause of your swelling, you may need to limit how much fluid you drink (fluid restriction). °Take over-the-counter and prescription medicines only as told by your health care provider. °Contact a health care provider if: °Your edema does not get better with treatment. °You have heart, liver, or kidney disease and have symptoms of edema. °You have sudden and unexplained weight gain. °Get help right away if: °You develop shortness of breath or chest pain. °You  cannot breathe when you lie down. °You develop pain, redness, or warmth in the swollen areas. °You have heart, liver, or kidney disease and suddenly get edema. °You have a fever and your symptoms suddenly get worse. °Summary °Edema is an abnormal buildup of fluids in the body tissues and under the skin. °Eating too much salt (sodium) and being on your feet or sitting for a long time can cause edema in your legs, feet, and ankles. °Keep the affected body part raised (elevated) above the level of your heart when you are sitting or lying down. °This information is not intended to replace advice given to you by your health care provider. Make sure you discuss any questions you have with your health care provider. °Document Revised: 07/15/2020 Document Reviewed: 12/09/2019 °Elsevier Patient Education © 2022 [employer] ° °

## 2021-04-19 NOTE — Progress Notes (Signed)
Patient ID: [name], female   DOB: 11/28/1970, 51 y.o.   MRN: [id number]  Chief Complaint  Patient presents with   New Patient (Initial Visit)    NP consult    HPI [name] is a 51 y.o. female.  I am asked to see the patient by Dr. Logan Sosa for evaluation of leg swelling and skin discoloration of both lower extremities worse on the left than the right.  This Sosa been a progressive and ongoing problem for months to years which Sosa gradually worsened over time.  She Sosa Down syndrome and her mother is her primary caregiver.  Her mother Sosa tried to get compression socks on her in the past, but Sosa been unable to get these on because they were too tight.  The patient is not particularly active and does keep her legs dependent much of the time.  No open wounds Sosa infection.  No fevers Sosa chills.  No previous history of DVT Sosa superficial thrombophlebitis to their knowledge.  The mother provides the history today.     Past Medical History:  Diagnosis Date   Dementia due to another medical condition (HCC)    Trisomy 21-related, sees [name] yearly, nl CT head 2010, did not tolerate aricept   Depression    with OCD   DJD (degenerative joint disease) of knee    bilat knee arthritis, had 3/3 Supartz injections [name])   Down syndrome    Dry ARMD 02/2013   Eczema    Empyema of left pleural space (HCC) 09/24/2011   Hypothyroid    OCD (obsessive compulsive disorder)    OSA on CPAP 05/2012   severe OSA with AHI 118, desat nadir to 67%   Pneumonia    multiple pneumonias   Presenile dementia, uncomplicated (HCC)    Seizure (HCC) 2013   x 2 ; "very mild"; during hospitalization "while coming off vent"    Past Surgical History:  Procedure Laterality Date   ACHILLES TENDON REPAIR Right 01/2017   [facility]   ANTERIOR CRUCIATE LIGAMENT REPAIR Left    FLEXIBLE BRONCHOSCOPY  09/24/2011   Procedure: FLEXIBLE BRONCHOSCOPY;  Surgeon: Christine Nails, MD;  Location: [name];   Service: Thoracic;  Laterality: N/A;   LUMBAR DISC SURGERY     ruptured disc   TRACHEOSTOMY TUBE PLACEMENT  10/11/2011   Procedure: TRACHEOSTOMY;  Surgeon: Christine Colonel, MD;  Location: [name];  Service: ENT;  Laterality: N/A;   VIDEO ASSISTED THORACOSCOPY (VATS) /THOROCOTOMY/RADIOACTIVE SEED IMPLANT Left 09/24/2011   VATS, (Dr. Barry Sosa)     Family History  Problem Relation Age of Onset   Arthritis Mother    Hypertension Father    Diabetes Father    Cancer Neg Hx    Coronary artery disease Neg Hx    Stroke Neg Hx      Social History   Tobacco Use   Smoking status: Never   Smokeless tobacco: Never  Vaping Use   Vaping Use: Never used  Substance Use Topics   Alcohol use: No   Drug use: No    No Known Allergies  Current Outpatient Medications  Medication Sig Dispense Refill   allopurinol (ZYLOPRIM) 100 MG tablet Take 1 tablet (100 mg total) by mouth daily. 90 tablet 3   Cholecalciferol (VITAMIN D3 PO) Take 500 Units by mouth daily.     clobetasol cream (TEMOVATE) 0.05 % APPLY TO AFFECTED AREA TWICE A DAY. (2 WEEKS AT A TIME) 45 g  0   ibuprofen (ADVIL) 800 MG tablet TAKE 1 TABLET BY MOUTH THREE TIMES A DAY 90 tablet 1   ketoconazole (NIZORAL) 2 % cream Apply 1 application topically 2 (two) times daily as needed for irritation. 60 g 1   ketoconazole (NIZORAL) 2 % shampoo      levETIRAcetam (KEPPRA) 500 MG tablet Take 1 tablet (500 mg total) by mouth 2 (two) times daily. 180 tablet 3   levothyroxine (SYNTHROID) 125 MCG tablet TAKE 1 TABLET BY MOUTH EVERY DAY BEFORE BREAKFAST 90 tablet 1   memantine (NAMENDA) 10 MG tablet Take 1 tablet (10 mg total) by mouth 2 (two) times daily. 180 tablet 3   Multiple Vitamins-Minerals (WOMENS MULTI PO) Take 1 tablet by mouth daily.      oxyCODONE-acetaminophen (PERCOCET) 5-325 MG tablet Take 1 tablet by mouth every 4 (four) hours as needed for severe Sosa. 30 tablet 0   sertraline (ZOLOFT) 50 MG tablet Take 1 tablet (50 mg total) by mouth at  bedtime. 30 tablet 11   traMADol (ULTRAM) 50 MG tablet Take 1 tablet (50 mg total) by mouth 3 (three) times daily as needed for moderate Sosa. 30 tablet 0   triamcinolone ointment (KENALOG) 0.1 % APPLY TO THE BODY NIGHTLY (NEVER THE FACE Sosa GROIN)     No current facility-administered medications for this visit.      REVIEW OF SYSTEMS (Negative unless checked)  Constitutional: [] Weight loss  [] Fever  [] Chills Cardiac: [] Chest Sosa   [] Chest pressure   [] Palpitations   [] Shortness of breath when laying flat   [] Shortness of breath at rest   [] Shortness of breath with exertion. Vascular:  [x] Sosa in legs with walking   [x] Sosa in legs at rest   [] Sosa in legs when laying flat   [] Claudication   [] Sosa in feet when walking  [] Sosa in feet at rest  [] Sosa in feet when laying flat   [] History of DVT   [] Phlebitis   [x] Swelling in legs   [] Varicose veins   [] Non-healing ulcers Pulmonary:   [] Uses home oxygen   [] Productive cough   [] Hemoptysis   [] Wheeze  [] COPD   [] Asthma Neurologic:  [] Dizziness  [] Blackouts   [x] Seizures   [] History of stroke   [] History of TIA  [] Aphasia   [] Temporary blindness   [] Dysphagia   [] Weakness Sosa numbness in arms   [] Weakness Sosa numbness in legs Musculoskeletal:  [] Arthritis   [] Joint swelling   [] Joint Sosa   [] Low back Sosa Hematologic:  [] Easy bruising  [] Easy bleeding   [] Hypercoagulable state   [] Anemic  [] Hepatitis Gastrointestinal:  [] Blood in stool   [] Vomiting blood  [x] Gastroesophageal reflux/heartburn   [] Abdominal Sosa Genitourinary:  [] Chronic kidney disease   [] Difficult urination  [] Frequent urination  [] Burning with urination   [] Hematuria Skin:  [] Rashes   [] Ulcers   [] Wounds Psychological:  [] History of anxiety   [x]  History of major depression.    Physical Exam BP 96/66    Pulse 70    Ht 4\' 10"  (1.473 m)    Wt 248 lb (112.5 kg)    BMI 51.83 kg/m  Gen:  WD/WN, NAD.  Obese Head: /AT, No temporalis wasting. Ear/Nose/Throat: Hearing grossly  intact, nares w/o erythema Sosa drainage, oropharynx w/o Erythema/Exudate Eyes: Conjunctiva clear, sclera non-icteric  Neck: trachea midline.  No JVD.  Pulmonary:  Good air movement, respirations not labored, no use of accessory muscles  Cardiac: RRR, no JVD Vascular:  Vessel Right Left  Radial Palpable Palpable  Gastrointestinal:. No masses, surgical incisions, Sosa scars. Musculoskeletal: M/S 5/5 throughout.  Extremities without ischemic changes.  No deformity Sosa atrophy.  2+ right lower extremity edema, 2-3+ left lower extremity edema.  Mild to moderate stasis dermatitis changes present on the right, moderate stasis dermatitis changes on the left. Neurologic: Sensation grossly intact in extremities.  Symmetrical.  Speech is fluent. Motor exam as listed above. Psychiatric: Judgment and insight are fairly poor.  Mother provides the history. Dermatologic: No rashes Sosa ulcers noted.  No cellulitis Sosa open wounds.    Radiology No results found.  Labs No results found for this Sosa any previous visit (from the past 2160 hour(s)).  Assessment/Plan:  Swelling of limb I have had a long discussion with the patient regarding swelling and why it  causes symptoms.  Patient will begin wearing graduated compression stockings class 1 (20-30 mmHg) on a daily basis a prescription was given. The patient will  beginning wearing the stockings first thing in the morning and removing them in the evening. The patient is instructed specifically not to sleep in the stockings.   In addition, behavioral modification will be initiated.  This will include frequent elevation, use of over the counter Sosa medications and exercise such as walking.  I have reviewed systemic causes for chronic edema such as liver, kidney and cardiac etiologies.  The patient denies problems with these organ systems.    Consideration for a lymph pump will also be made based upon the effectiveness of  conservative therapy.  This would help to improve the edema control and prevent sequela such as ulcers and infections   Patient should undergo duplex ultrasound of the venous system to ensure that DVT Sosa reflux is not present.  The patient will follow-up with me after the ultrasound.    Prediabetes blood glucose control important in reducing the progression of atherosclerotic disease. Also, involved in wound healing. On appropriate medications.   Down syndrome This certainly limits her functional status somewhat, although she Sosa been very functional and survived to the age of 32 thus far.  Her mobility and activity are less than ideal.  Morbid obesity with BMI of 50.0-59.9, adult ([place]) Worsens LE swelling.  Weight loss would be of benefit.      [name] 04/19/2021, 11:11 AM   This note was created with Dragon medical transcription system.  Any errors from dictation are unintentional.

## 2021-04-19 NOTE — Assessment & Plan Note (Signed)
Worsens LE swelling.  Weight loss would be of benefit.

## 2021-04-19 NOTE — Assessment & Plan Note (Signed)
This certainly limits her functional status somewhat, although she has been very functional and survived to the age of 69 thus far.  Her mobility and activity are less than ideal.

## 2021-04-22 ENCOUNTER — Other Ambulatory Visit: Payer: Self-pay | Admitting: Adult Health

## 2021-04-25 ENCOUNTER — Other Ambulatory Visit: Payer: Self-pay | Admitting: Family Medicine

## 2021-04-26 NOTE — Telephone Encounter (Signed)
Refill request Temovate Last refill 04/07/21 45 G Last office visit 10/18/20

## 2021-04-27 NOTE — Telephone Encounter (Signed)
ERx 

## 2021-05-11 ENCOUNTER — Encounter (INDEPENDENT_AMBULATORY_CARE_PROVIDER_SITE_OTHER): Payer: Self-pay | Admitting: Nurse Practitioner

## 2021-05-11 ENCOUNTER — Ambulatory Visit (INDEPENDENT_AMBULATORY_CARE_PROVIDER_SITE_OTHER): Payer: Medicare PPO | Admitting: Nurse Practitioner

## 2021-05-11 ENCOUNTER — Other Ambulatory Visit: Payer: Self-pay

## 2021-05-11 ENCOUNTER — Ambulatory Visit (INDEPENDENT_AMBULATORY_CARE_PROVIDER_SITE_OTHER): Payer: Medicare PPO

## 2021-05-11 VITALS — BP 95/64 | HR 61 | Resp 16 | Ht <= 58 in | Wt 249.0 lb

## 2021-05-11 DIAGNOSIS — I82432 Acute embolism and thrombosis of left popliteal vein: Secondary | ICD-10-CM | POA: Diagnosis not present

## 2021-05-11 DIAGNOSIS — M7989 Other specified soft tissue disorders: Secondary | ICD-10-CM | POA: Diagnosis not present

## 2021-05-11 DIAGNOSIS — Z86718 Personal history of other venous thrombosis and embolism: Secondary | ICD-10-CM

## 2021-05-11 HISTORY — DX: Personal history of other venous thrombosis and embolism: Z86.718

## 2021-05-11 MED ORDER — APIXABAN 5 MG PO TABS: 5.0000 mg | ORAL_TABLET | Freq: Two times a day (BID) | ORAL | 3 refills | Status: DC

## 2021-05-11 NOTE — Progress Notes (Addendum)
[facility] - Clinic Note  05/13/2021     CHIEF COMPLAINT Patient presents for Retina Evaluation   HISTORY OF PRESENT ILLNESS: [name] is a 51 y.o. female who presents to the clinic today for:   HPI     Retina Evaluation   In both eyes.  I, the attending physician,  performed the HPI with the patient and updated documentation appropriately.        Comments   New patient here for retinal eval. Pt states she was seeing an eye doctor in [place] who referred her here to get her eyes checked. Pt was being seen annually at office in [place]. No reports of any vision changes or issues recently. Used to see Dr. Ashley Royalty in 2015. Pt is not diabetic. She was last seen at [facility] 04/09/20, notes in epic. Pt just received new rx specs.       Last edited by Rennis [name], MD on 05/14/2021  3:11 PM.    Pt is previous Dr. Ashley Royalty pt, but he has not seen her since 2015, he referred her to Dr. Everlena Cooper at [facility], pt wanted to come back here and be seen due to transportation to [facility], pt last saw [facility] in February 2022, pt states vision has been stable since then, dad states Jaffe put pt on eye drops, but she is no longer taking those  Referring physician:   HISTORICAL INFORMATION:   Selected notes from the MEDICAL RECORD NUMBER Referred by Dr. Nedra Hai:  Ocular Hx- granulomatous chorioretinitis OU -- formerly followed by Dr. Everlena Cooper annually -- pt stable and wanted to transfer ophthalmologic care closer to home PMH-previously followed by Dr. Ashley Royalty (05.15.12) and Dr. [facility]lena Cooper at [facility] (02.11.22)    CURRENT MEDICATIONS: No current outpatient medications on file. (Ophthalmic Drugs)   No current facility-administered medications for this visit. (Ophthalmic Drugs)   Current Outpatient Medications (Other)  Medication Sig   allopurinol (ZYLOPRIM) 100 MG tablet Take 1 tablet (100 mg total) by mouth daily.   apixaban (ELIQUIS) 5 MG TABS tablet Take 1 tablet (5 mg total) by  mouth 2 (two) times daily.   Cholecalciferol (VITAMIN D3 PO) Take 500 Units by mouth daily.   clobetasol cream (TEMOVATE) 0.05 % Apply topically 2 (two) times daily as needed (skin rash). Don't use more than 2 weeks at a time, avoid face, underarms, groin.   ibuprofen (ADVIL) 800 MG tablet TAKE 1 TABLET BY MOUTH THREE TIMES A DAY   ketoconazole (NIZORAL) 2 % cream Apply 1 application topically 2 (two) times daily as needed for irritation.   ketoconazole (NIZORAL) 2 % shampoo    levETIRAcetam (KEPPRA) 500 MG tablet Take 1 tablet (500 mg total) by mouth 2 (two) times daily.   levothyroxine (SYNTHROID) 125 MCG tablet TAKE 1 TABLET BY MOUTH EVERY DAY BEFORE BREAKFAST   memantine (NAMENDA) 10 MG tablet Take 1 tablet (10 mg total) by mouth 2 (two) times daily.   Multiple Vitamins-Minerals (WOMENS MULTI PO) Take 1 tablet by mouth daily.    sertraline (ZOLOFT) 50 MG tablet TAKE 1 TABLET BY MOUTH EVERYDAY AT BEDTIME   triamcinolone ointment (KENALOG) 0.1 % APPLY TO THE BODY NIGHTLY (NEVER THE FACE OR GROIN)   No current facility-administered medications for this visit. (Other)   REVIEW OF SYSTEMS: ROS   Positive for: Endocrine, Eyes, Psychiatric Negative for: Constitutional, Gastrointestinal, Neurological, Skin, Genitourinary, Musculoskeletal, HENT, Cardiovascular, Respiratory, Allergic/Imm, Heme/Lymph Last edited by Thompson Grayer, COT on 05/13/2021  1:57  PM.     ALLERGIES No Known Allergies  PAST MEDICAL HISTORY Past Medical History:  Diagnosis Date   Dementia due to another medical condition (HCC)    Trisomy 21-related, sees Sethi yearly, nl CT head 2010, did not tolerate aricept   Depression    with OCD   DJD (degenerative joint disease) of knee    bilat knee arthritis, had 3/3 Supartz injections [name])   Down syndrome    Dry ARMD 02/2013   Eczema    Empyema of left pleural space (HCC) 09/24/2011   Hypothyroid    OCD (obsessive compulsive disorder)    OSA on CPAP 05/2012    severe OSA with AHI 118, desat nadir to 67%   Pneumonia    multiple pneumonias   Presenile dementia, uncomplicated (HCC)    Seizure (HCC) 2013   x 2 ; "very mild"; during hospitalization "while coming off vent"   Past Surgical History:  Procedure Laterality Date   ACHILLES TENDON REPAIR Right 01/2017   [facility]   ANTERIOR CRUCIATE LIGAMENT REPAIR Left    FLEXIBLE BRONCHOSCOPY  09/24/2011   Procedure: FLEXIBLE BRONCHOSCOPY;  Surgeon: Purcell Nails, MD;  Location: [facility];  Service: Thoracic;  Laterality: N/A;   LUMBAR DISC SURGERY     ruptured disc   TRACHEOSTOMY TUBE PLACEMENT  10/11/2011   Procedure: TRACHEOSTOMY;  Surgeon: Serena Colonel, MD;  Location: [facility] OR;  Service: ENT;  Laterality: N/A;   VIDEO ASSISTED THORACOSCOPY (VATS) /THOROCOTOMY/RADIOACTIVE SEED IMPLANT Left 09/24/2011   VATS, (Dr. Barry Dienes)   FAMILY HISTORY Family History  Problem Relation Age of Onset   Arthritis Mother    Hypertension Father    Diabetes Father    Cancer Neg Hx    Coronary artery disease Neg Hx    Stroke Neg Hx    SOCIAL HISTORY Social History   Tobacco Use   Smoking status: Never   Smokeless tobacco: Never  Vaping Use   Vaping Use: Never used  Substance Use Topics   Alcohol use: No   Drug use: No       OPHTHALMIC EXAM:  Base Eye Exam     Visual Acuity (Snellen - Linear)       Right Left   Dist cc 20/70 20/50   Dist ph cc NI NI    Correction: Glasses         Tonometry (Tonopen, 2:15 PM)       Right Left   Pressure 16 15         Pupils       Dark Light Shape React APD   Right 3 2 Round Brisk None   Left 3 2 Round Brisk None         Visual Fields (Counting fingers)       Left Right    Full Full         Extraocular Movement       Right Left    Full, Ortho Full, Ortho         Neuro/Psych     Oriented x3: Yes   Mood/Affect: Normal         Dilation     Both eyes: 1.0% Mydriacyl, 2.5% Phenylephrine @ 2:17 PM            Slit Lamp and Fundus Exam     Slit Lamp Exam       Right Left   Lids/Lashes Dermatochalasis - upper lid Dermatochalasis - upper lid  Conjunctiva/Sclera White and quiet White and quiet   Cornea 1+ Punctate epithelial erosions 1+ Punctate epithelial erosions   Anterior Chamber Deep and quiet, no cell or flare Deep and quiet, no cell or flare   Iris Round and dilated Round and dilated   Lens 1-2+ Cortical cataract, pigment on anterior capsule 1+ Cortical cataract   Anterior Vitreous Vitreous syneresis, no cell Vitreous syneresis, no cell         Fundus Exam       Right Left   Disc 2+ mild Pallor, Sharp rim, +PPA/PPP 2+ mild Pallor, Sharp rim, +PPA/PPP   Macula Flat, Blunted foveal reflex, scattered drusen Flat, Blunted foveal reflex, mild ERM, RPE mottling, no heme or edema   Vessels attenuated, +pigment clumping along IT arcades attenuated, +pigment clumping along IT arcades   Periphery Attached, peripapillary CR atrophy with pigment clumping extending along arcades, scattered pigmented CR scarring, +drusen, reticular degeneration, No RT/RD, ?shallow schisis IT periphery Attached, peripheral bone spicules ST quad, bullous schisis along IT arcades from 0400-0530, reticular degeneration           Refraction     Wearing Rx       Sphere Cylinder Axis   Right -1.00 +3.00 056   Left +1.25 +1.00 153         Manifest Refraction       Sphere Cylinder Axis Dist VA   Right -0.75 +1.75 090 20/70   Left +1.50 +1.25 150 20/50            IMAGING AND PROCEDURES  Imaging and Procedures for 05/13/2021  OCT, Retina - OU - Both Eyes       Right Eye Quality was borderline. Progression has been stable. Findings include abnormal foveal contour, no IRF, no SRF, epiretinal membrane, retinal drusen (ERM with blunted foveal contour - stable from 2015 images).   Left Eye Quality was good. Central Foveal Thickness: 338. Progression has been stable. Findings include abnormal foveal  contour, no IRF, no SRF, epiretinal membrane (Mild ERM with blunted foveal contour, bullous schisis IT periphery caught on widefield).   Notes *Images captured and stored on drive  Diagnosis / Impression:  OD: ERM with blunted foveal contour  - stable from 2015 images OS: Mild ERM with blunted foveal contour, bullous schisis IT periphery caught on widefield  Clinical management:  See below  Abbreviations: NFP - Normal foveal profile. CME - cystoid macular edema. PED - pigment epithelial detachment. IRF - intraretinal fluid. SRF - subretinal fluid. EZ - ellipsoid zone. ERM - epiretinal membrane. ORA - outer retinal atrophy. ORT - outer retinal tubulation. SRHM - subretinal hyper-reflective material. IRHM - intraretinal hyper-reflective material           ASSESSMENT/PLAN:    ICD-10-CM   1. Granulomatous chorioretinitis of both eyes  H30.893     2. Left retinoschisis  H33.102     3. Epiretinal membrane (ERM) of both eyes  H35.373 OCT, Retina - OU - Both Eyes    4. Cortical cataract of both eyes  H26.9       1. Chorioretinitis OU - pt of Dr. Sharee Pimple at [facility], originally referred by [facility] in 2015 - pt has been stable for last several years without active inflammation on no medicationsand has been monitored annually - due to recent stability, pt and family requested retina follow up closer to home - BCVA 20/70 OD, 20/50 OS -- subjectively and objectively stable (BC[facility] at [facility] 03/2020 was 20/64 OU) - exam  shows pigmented CR scarring OU -- no signs of active inflammation - will need to check FA -- unable to do today - no retinal or ophthalmic interventions indicated or recommended   - f/u 3-4 months, DFE, OCT, FA (transit OD)  2. Retinoschisis OS  - bullous retinoschisis IT periphery OU w/ posterior extension just outside IT arcades  - no associated RT/RD OS  - ?shallow schisis IT periphery OD  - monitor  - f/u 3-4 mos  3. Epiretinal membrane, both eyes  - The natural  history, anatomy, potential for loss of vision, and treatment options including vitrectomy techniques and the complications of endophthalmitis, retinal detachment, vitreous hemorrhage, cataract progression and permanent vision loss discussed with the patient. - mild ERM OU w/ blunting of foveal contours - BCVA 20/70 OD, 20/50 OS - asymptomatic, no metamorphopsia - no indication for surgery at this time - monitor for now - f/u 3 mos -- DFE/OCT  4. Cortical Cataract OU - The symptoms of cataract, surgical options, and treatments and risks were discussed with patient. - discussed diagnosis and progression - not yet visually significant - monitor for now   Ophthalmic Meds Ordered this visit:  No orders of the defined types were placed in this encounter.    Return for f/u 3-4 months, chorioretinitis OU, DFE, OCT, Optos FA (transit OD).  There are no Patient Instructions on file for this visit.   Explained the diagnoses, plan, and follow up with the patient and they expressed understanding.  Patient expressed understanding of the importance of proper follow up care.   This document serves as a record of services personally performed by Karie Chimera, MD, PhD. It was created on their behalf by Joni Reining, an ophthalmic technician. The creation of this record is the provider's dictation and/or activities during the visit.    Electronically signed by: Joni Reining COA, 05/14/21  3:26 PM  This document serves as a record of services personally performed by Karie Chimera, MD, PhD. It was created on their behalf by Glee Arvin. Manson Passey, OA an ophthalmic technician. The creation of this record is the provider's dictation and/or activities during the visit.    Electronically signed by: Glee Arvin. Manson Passey, [place] 03.17.2023 3:26 PM  Karie Chimera, M.D., Ph.D. Diseases & Surgery of the Retina and Vitreous [facility]  I have reviewed the above documentation for accuracy and  completeness, and I agree with the above. Karie Chimera, M.D., Ph.D. 05/14/21 3:26 PM  Abbreviations: M myopia (nearsighted); A astigmatism; H hyperopia (farsighted); P presbyopia; Mrx spectacle prescription;  CTL contact lenses; OD right eye; OS left eye; OU both eyes  XT exotropia; ET esotropia; PEK punctate epithelial keratitis; PEE punctate epithelial erosions; DES dry eye syndrome; MGD meibomian gland dysfunction; ATs artificial tears; PFAT's preservative free artificial tears; NSC nuclear sclerotic cataract; PSC posterior subcapsular cataract; ERM epi-retinal membrane; PVD posterior vitreous detachment; RD retinal detachment; DM diabetes mellitus; DR diabetic retinopathy; NPDR non-proliferative diabetic retinopathy; PDR proliferative diabetic retinopathy; CSME clinically significant macular edema; DME diabetic macular edema; dbh dot blot hemorrhages; CWS cotton wool spot; POAG primary open angle glaucoma; C/D cup-to-disc ratio; HVF humphrey visual field; GVF goldmann visual field; OCT optical coherence tomography; IOP intraocular pressure; BRVO Branch retinal vein occlusion; CRVO central retinal vein occlusion; CRAO central retinal artery occlusion; BRAO branch retinal artery occlusion; RT retinal tear; SB scleral buckle; PPV pars plana vitrectomy; VH Vitreous hemorrhage; PRP panretinal laser photocoagulation; IVK intravitreal kenalog; VMT  vitreomacular traction; MH Macular hole;  NVD neovascularization of the disc; NVE neovascularization elsewhere; AREDS age related eye disease study; ARMD age related macular degeneration; POAG primary open angle glaucoma; EBMD epithelial/anterior basement membrane dystrophy; ACIOL anterior chamber intraocular lens; IOL intraocular lens; PCIOL posterior chamber intraocular lens; Phaco/IOL phacoemulsification with intraocular lens placement; PRK photorefractive keratectomy; LASIK laser assisted in situ keratomileusis; HTN hypertension; DM diabetes mellitus; COPD chronic  obstructive pulmonary disease

## 2021-05-13 ENCOUNTER — Ambulatory Visit (INDEPENDENT_AMBULATORY_CARE_PROVIDER_SITE_OTHER): Payer: Medicare PPO | Admitting: Ophthalmology

## 2021-05-13 ENCOUNTER — Encounter (INDEPENDENT_AMBULATORY_CARE_PROVIDER_SITE_OTHER): Payer: Self-pay | Admitting: Ophthalmology

## 2021-05-13 ENCOUNTER — Other Ambulatory Visit: Payer: Self-pay

## 2021-05-13 DIAGNOSIS — H269 Unspecified cataract: Secondary | ICD-10-CM

## 2021-05-13 DIAGNOSIS — H30893 Other chorioretinal inflammations, bilateral: Secondary | ICD-10-CM

## 2021-05-13 DIAGNOSIS — H35373 Puckering of macula, bilateral: Secondary | ICD-10-CM | POA: Diagnosis not present

## 2021-05-13 DIAGNOSIS — H33102 Unspecified retinoschisis, left eye: Secondary | ICD-10-CM | POA: Diagnosis not present

## 2021-05-13 DIAGNOSIS — H3581 Retinal edema: Secondary | ICD-10-CM

## 2021-05-13 DIAGNOSIS — H33103 Unspecified retinoschisis, bilateral: Secondary | ICD-10-CM

## 2021-05-14 ENCOUNTER — Encounter (INDEPENDENT_AMBULATORY_CARE_PROVIDER_SITE_OTHER): Payer: Self-pay | Admitting: Ophthalmology

## 2021-05-14 ENCOUNTER — Other Ambulatory Visit: Payer: Self-pay | Admitting: Podiatry

## 2021-05-18 ENCOUNTER — Telehealth: Payer: Self-pay | Admitting: *Deleted

## 2021-05-18 NOTE — Telephone Encounter (Signed)
Patient is not on the medicine Doxycycline and her mother does not wish to refill. This was something she took a long time ago, doesn't know why pharmacy resent.  ?

## 2021-05-18 NOTE — Telephone Encounter (Signed)
Patient's mom is requesting the results of her vascular study(05/16/21), stated that they told her at [facility] that she had a blood clot in one of her legs, wanted to discuss and to thank Dr Logan Bores for ordering the study. ?

## 2021-05-19 ENCOUNTER — Telehealth (INDEPENDENT_AMBULATORY_CARE_PROVIDER_SITE_OTHER): Payer: Self-pay | Admitting: Vascular Surgery

## 2021-05-19 NOTE — Telephone Encounter (Signed)
Heavy bleeding while on eliquis isn't unusual, however, bleeding when she has not bled in some time is more concerning, especially if she is menopausal.  I would recommend reaching out to PCP or OBGYN to evaluate to ensure this isn't a warning sign for a larger underlying issue.

## 2021-05-19 NOTE — Telephone Encounter (Signed)
I spoke with Ms [name] and informed that the patient hasn't had menstrual cycle in a long time. The patient started having heavy bleeding 3 days ago after being on Eliquis. Please Advise ?

## 2021-05-19 NOTE — Telephone Encounter (Signed)
Calling regarding patient being on blood thinner for a week and she is having extra bleeding.  Please advise. ?

## 2021-05-19 NOTE — Telephone Encounter (Signed)
Patient mother was made aware with medical advice and verbalized understanding ?

## 2021-05-20 ENCOUNTER — Encounter: Payer: Self-pay | Admitting: Family Medicine

## 2021-05-20 ENCOUNTER — Ambulatory Visit (INDEPENDENT_AMBULATORY_CARE_PROVIDER_SITE_OTHER): Payer: Medicare PPO | Admitting: Family Medicine

## 2021-05-20 ENCOUNTER — Other Ambulatory Visit: Payer: Self-pay

## 2021-05-20 ENCOUNTER — Telehealth: Payer: Self-pay

## 2021-05-20 VITALS — BP 124/66 | HR 67 | Temp 97.8°F | Ht <= 58 in | Wt 245.0 lb

## 2021-05-20 DIAGNOSIS — N939 Abnormal uterine and vaginal bleeding, unspecified: Secondary | ICD-10-CM

## 2021-05-20 DIAGNOSIS — I82432 Acute embolism and thrombosis of left popliteal vein: Secondary | ICD-10-CM

## 2021-05-20 LAB — CBC WITH DIFFERENTIAL/PLATELET
Absolute Monocytes: 470 cells/uL (ref 200–950)
Basophils Absolute: 91 cells/uL (ref 0–200)
Basophils Relative: 1.9 %
Eosinophils Absolute: 82 cells/uL (ref 15–500)
Eosinophils Relative: 1.7 %
HCT: 39.7 % (ref 35.0–45.0)
Hemoglobin: 13.1 g/dL (ref 11.7–15.5)
Lymphs Abs: 1291 cells/uL (ref 850–3900)
MCH: 33.9 pg — ABNORMAL HIGH (ref 27.0–33.0)
MCHC: 33 g/dL (ref 32.0–36.0)
MCV: 102.6 fL — ABNORMAL HIGH (ref 80.0–100.0)
MPV: 10.9 fL (ref 7.5–12.5)
Monocytes Relative: 9.8 %
Neutro Abs: 2866 cells/uL (ref 1500–7800)
Neutrophils Relative %: 59.7 %
Platelets: 202 10*3/uL (ref 140–400)
RBC: 3.87 10*6/uL (ref 3.80–5.10)
RDW: 13.8 % (ref 11.0–15.0)
Total Lymphocyte: 26.9 %
WBC: 4.8 10*3/uL (ref 3.8–10.8)

## 2021-05-20 NOTE — Telephone Encounter (Signed)
Called patient. Spoke with mother. Answered questions. RTC PRN. - Dr. Amalia Hailey

## 2021-05-20 NOTE — Patient Instructions (Addendum)
Labs today to check blood count. ?Hopefully bleeding improves on lower dose eliquis.  ?Let us know if ongoing to see GYN for further evaluation.  ? ?Abnormal Uterine Bleeding ?Abnormal uterine bleeding is unusual bleeding from the uterus. It includes bleeding after sex, or bleeding or spotting between menstrual periods. It may also include bleeding that is heavier than normal, menstrual periods that last longer than usual, or bleeding that occurs after menopause. ?Abnormal uterine bleeding can affect teenagers, women in their reproductive years, pregnant women, and women who have reached menopause. Common causes of abnormal uterine bleeding include: ?Pregnancy. ?Abnormal growths within the lining of the uterus (polyps). ?Benign tumors or growths in the uterus (fibroids). These are not cancer. ?Infection. ?Cancer. ?Too much or too little of some hormones in the body (hormonal imbalances). ?Any type of abnormal bleeding should be checked by a health care provider. Many cases are minor and simple to treat, but others may be more serious. Treatment will depend on the cause of the bleeding and how severe it is. ?Follow these instructions at home: ?Medicines ?Take over-the-counter and prescription medicines only as told by your health care provider. ?Ask your health care provider about: ?Taking medicines such as aspirin and ibuprofen. These medicines can thin your blood. Do not take these medicines unless your health care provider tells you to take them. ?Taking over-the-counter medicines, vitamins, herbs, and supplements. ?If you were prescribed iron pills, take them as told by your health care provider. Iron pills help to replace iron that your body loses because of this condition. ?Managing constipation ?In cases of severe bleeding, you may be asked to increase your iron intake to treat anemia. Doing this may cause constipation. To prevent or treat constipation, you may need to: ?Drink enough fluid to keep your urine  pale yellow. ?Take over-the-counter or prescription medicines. ?Eat foods that are high in fiber, such as beans, whole grains, and fresh fruits and vegetables. ?Limit foods that are high in fat and processed sugars, such as fried or sweet foods. ?Activity ?Alter your activity to decrease bleeding if you need to change your sanitary pad more than one time every 2 hours: ?Lie in bed with your feet raised (elevated). ?Place a cold pack on your lower abdomen. ?Rest as much as possible until the bleeding stops or slows down. ?General instructions ?Do not use tampons, douche, or have sex until your health care provider says these things are okay. ?Change your sanitary pads often. ?Get regular exams. These include pelvic exams and cervical cancer screenings. ?It is up to you to get the results of any tests that are done. Ask your health care provider, or the department that is doing the tests, when your results will be ready. ?Monitor your condition for any changes. For 2 months, write down: ?When your menstrual period starts. ?When your menstrual period ends. ?When any abnormal vaginal bleeding occurs. ?What problems you notice. ?Keep all follow-up visits. This is important. ?Contact a health care provider if: ?You have bleeding that lasts for more than one week. ?You feel dizzy at times. ?You feel nauseous or you vomit. ?You feel light-headed or weak. ?You notice any other changes that show that your condition is getting worse. ?Get help right away if: ?You faint. ?You have bleeding that soaks through a sanitary pad every hour. ?You have pain in the abdomen. ?You have a fever or chills. ?You become sweaty or weak. ?You pass large blood clots from your vagina. ?These symptoms may represent a serious  problem that is an emergency. Do not wait to see if the symptoms will go away. Get medical help right away. Call your local emergency services (911 in the [place] Do not drive yourself to the hospital. ?Summary ?Abnormal  uterine bleeding is unusual bleeding from the uterus. ?Any type of abnormal bleeding should be checked by a health care provider. Many cases are minor and simple to treat, but others may be more serious. ?Treatment will depend on the cause of the bleeding and how severe it is. ?Get help right away if you faint, you have bleeding that soaks through a sanitary pad every hour, or you pass large blood clots from your vagina. ?This information is not intended to replace advice given to you by your health care provider. Make sure you discuss any questions you have with your health care provider. ?Document Revised: 06/15/2020 Document Reviewed: 06/15/2020 ?Elsevier Patient Education ? 2022 [employer] Inc[employer] ? ?

## 2021-05-20 NOTE — Progress Notes (Signed)
? ? Patient ID: [name], female    DOB: 01-29-1971, 51 y.o.   MRN: [id number] ? ?This visit was conducted in person. ? ?BP 124/66   Pulse 67   Temp 97.8 ?F (36.6 ?C) (Temporal)   Ht 4\' 10"  (1.473 m)   Wt 245 lb (111.1 kg)   LMP  (Within Months)   SpO2 95%   BMI 51.21 kg/m?   ? ?CC: vaginal bleeding  ?Subjective:  ? ?HPI: ?[name] is a 51 y.o. female presenting on 05/20/2021 for Vaginal Bleeding (C/o vaginal bleeding.  Started pt started 05/19/21 as blood clots after starting Eliquis on 05/12/21. Pt accompanied by mom, [name]. ) and Blister (C/o blister on L leg. ) ? ? ?Recently established with [facility] for leg swelling. Vascular ultrasound this week showed L partially occlusive acute to subacute thrombus of left mid-distal popliteal vein along with venous reflux bilaterally.  ?This is her first DVT.  ?She was started on eliquis 05/11/2021 - loading dose over the past week. Currently on 5mg  twice daily.  ? ?Over the weekend, had heavy bleeding going through clothing. Bleeding has continued over the past 5 days, but today seems to be clotting.  ? ?LMP - summer 2023. Prior to this menopausal with intermittent periods for last few years.  ? ?Has not had pelvic exam previously. Previous attempts unsuccessful due to discomfort/anxiety.  ? ?No noted blood in stool or urine. No dysuria or UTI symptoms. No vaginal discharge. No abd pain. No diarrhea or constipation.  ? ?L leg started blistering today.  ?She has started wearing compression stockings.  ?R ankle arthroscopy with lateral collateral ligament stabilization 10/2020 [name]) ?   ? ?Relevant past medical, surgical, family and social history reviewed and updated as indicated. Interim medical history since our last visit reviewed. ?Allergies and medications reviewed and updated. ?Outpatient Medications Prior to Visit  ?Medication Sig Dispense Refill  ? allopurinol (ZYLOPRIM) 100 MG tablet Take 1 tablet (100 mg total) by mouth daily. 90 tablet 3  ? apixaban  (ELIQUIS) 5 MG TABS tablet Take 1 tablet (5 mg total) by mouth 2 (two) times daily. 60 tablet 3  ? Cholecalciferol (VITAMIN D3 PO) Take 500 Units by mouth daily.    ? clobetasol cream (TEMOVATE) 0.05 % Apply topically 2 (two) times daily as needed (skin rash). Don't use more than 2 weeks at a time, avoid face, underarms, groin. 45 g 0  ? ibuprofen (ADVIL) 800 MG tablet TAKE 1 TABLET BY MOUTH THREE TIMES A DAY 90 tablet 1  ? ketoconazole (NIZORAL) 2 % cream Apply 1 application topically 2 (two) times daily as needed for irritation. 60 g 1  ? ketoconazole (NIZORAL) 2 % shampoo     ? levETIRAcetam (KEPPRA) 500 MG tablet Take 1 tablet (500 mg total) by mouth 2 (two) times daily. 180 tablet 3  ? levothyroxine (SYNTHROID) 125 MCG tablet TAKE 1 TABLET BY MOUTH EVERY DAY BEFORE BREAKFAST 90 tablet 1  ? memantine (NAMENDA) 10 MG tablet Take 1 tablet (10 mg total) by mouth 2 (two) times daily. 180 tablet 3  ? Multiple Vitamins-Minerals (WOMENS MULTI PO) Take 1 tablet by mouth daily.     ? sertraline (ZOLOFT) 50 MG tablet TAKE 1 TABLET BY MOUTH EVERYDAY AT BEDTIME 90 tablet 3  ? triamcinolone ointment (KENALOG) 0.1 % APPLY TO THE BODY NIGHTLY (NEVER THE FACE OR GROIN)    ? ?No facility-administered medications prior to visit.  ?  ? ?Per HPI unless specifically  indicated in ROS section below ?Review of Systems ? ?Objective:  ?BP 124/66   Pulse 67   Temp 97.8 ?F (36.6 ?C) (Temporal)   Ht 4\' 10"  (1.473 m)   Wt 245 lb (111.1 kg)   LMP  (Within Months)   SpO2 95%   BMI 51.21 kg/m?   ?Wt Readings from Last 3 Encounters:  ?05/20/21 245 lb (111.1 kg)  ?05/11/21 249 lb (112.9 kg)  ?04/19/21 248 lb (112.5 kg)  ?  ?  ?Physical Exam ?Vitals and nursing note reviewed.  ?Constitutional:   ?   Appearance: Normal appearance. She is obese. She is not ill-appearing.  ?   Comments: Difficulty getting on exam table  ?Eyes:  ?   Extraocular Movements: Extraocular movements intact.  ?   Pupils: Pupils are equal, round, and reactive to light.   ?Cardiovascular:  ?   Rate and Rhythm: Normal rate and regular rhythm.  ?   Pulses: Normal pulses.  ?   Heart sounds: Normal heart sounds. No murmur heard. ?Pulmonary:  ?   Effort: Pulmonary effort is normal. No respiratory distress.  ?   Breath sounds: Normal breath sounds. No wheezing, rhonchi or rales.  ?Abdominal:  ?   General: Bowel sounds are normal. There is no distension.  ?   Palpations: Abdomen is soft. There is no mass.  ?   Tenderness: There is generalized abdominal tenderness (mild). There is no guarding or rebound.  ?   Hernia: No hernia is present.  ?Genitourinary: ?   Comments: Declined due to discomfort/anxiety ?Skin: ?   General: Skin is warm and dry.  ?   Findings: Rash present. Rash is vesicular.  ?   Comments:  ?Early micro-blistering of skin to anterior LLE without open sore, weeping or drainage ?Hyperpigmentation to LLE consistent with hemosiderin deposition and changes of venous insufficiency  ?Neurological:  ?   Mental Status: She is alert.  ?Psychiatric:     ?   Mood and Affect: Mood normal.     ?   Behavior: Behavior normal.  ? ?   ?Results for orders placed or performed in visit on 05/20/21  ?CBC with Differential/Platelet  ?Result Value Ref Range  ? WBC 4.8 3.8 - 10.8 Thousand/uL  ? RBC 3.87 3.80 - 5.10 Million/uL  ? Hemoglobin 13.1 11.7 - 15.5 g/dL  ? HCT 39.7 35.0 - 45.0 %  ? MCV 102.6 (H) 80.0 - 100.0 fL  ? MCH 33.9 (H) 27.0 - 33.0 pg  ? MCHC 33.0 32.0 - 36.0 g/dL  ? RDW 13.8 11.0 - 15.0 %  ? Platelets 202 140 - 400 Thousand/uL  ? MPV 10.9 7.5 - 12.5 fL  ? Neutro Abs 2,866 1,500 - 7,800 cells/uL  ? Lymphs Abs 1,291 850 - 3,900 cells/uL  ? Absolute Monocytes 470 200 - 950 cells/uL  ? Eosinophils Absolute 82 15 - 500 cells/uL  ? Basophils Absolute 91 0 - 200 cells/uL  ? Neutrophils Relative % 59.7 %  ? Total Lymphocyte 26.9 %  ? Monocytes Relative 9.8 %  ? Eosinophils Relative 1.7 %  ? Basophils Relative 1.9 %  ? ? ?Assessment & Plan:  ?This visit occurred during the SARS-CoV-2 public  health emergency.  Safety protocols were in place, including screening questions prior to the visit, additional usage of staff PPE, and extensive cleaning of exam room while observing appropriate contact time as indicated for disinfecting solutions.  ? ?Problem List Items Addressed This Visit   ? ? Vaginal bleeding -  Primary  ?  Perimenopausal vaginal bleeding in setting of recent eliquis loading dose with planned eliquis for 3 months. Discussed ddx including fibroid, cancer, other. Recommend GYN referral to discuss sedated GYN exam - mom declines, aware of risks of missed diagnosis. Will check CBC and trans-abdominal pelvic ultrasound as pt has previously been unable to tolerate vaginal exam.  ?They do agree, however, if recurrent bleed to seek GYN care.  ?  ?  ? Relevant Orders  ? CBC with Differential/Platelet (Completed)  ? US PELVIS (TRANSABDOMINAL ONLY)  ? Deep vein thrombosis (DVT) of popliteal vein of left lower extremity ([place])  ?  Acute to subacute partially occlusive L mid distal popliteal vein on venous ultrasound along with bilateral venous reflux. Currently on eliquis 5mg  bid, just completed loading dose. She is also regularly wearing compression stockings with benefit. Appreciate [facility]VS care. ?  ?  ?  ? ?No orders of the defined types were placed in this encounter. ? ?Orders Placed This Encounter  ?Procedures  ? US PELVIS (TRANSABDOMINAL ONLY)  ?  Standing Status:   Future  ?  Standing Expiration Date:   05/24/2022  ?  Order Specific Question:   Reason for Exam (SYMPTOM  OR DIAGNOSIS REQUIRED)  ?  Answer:   perimenopausal vaginal bleeding on new eliquis  ?  Order Specific Question:   Preferred imaging location?  ?  Answer:   [id number]  ? CBC with Differential/Platelet  ? ? ? ?Patient instructions: ?Labs today to check blood count. ?Hopefully bleeding improves on lower dose eliquis.  ?Let us know if ongoing to see GYN for further evaluation.  ? ?Follow up plan: ?Return if symptoms worsen or fail to  improve. ? ?Ria Bush, MD   ?

## 2021-05-20 NOTE — Telephone Encounter (Signed)
Please see vascular note on 05/19/21. I spoke with pts mom (DPR signed) and pt started Eliquis on 05/12/21 and pt started heavy vaginal bleeding on 05/15/21. Pt started passing some blood clots on 05/19/21.pt has not had menstrual period in over 6 months. No abd pain. No other bleeding that pts mom aware of. Pt has blistering on lower left leg. No covid symptoms per pts mom. Pts mom scheduled appt with Dr Reece Agar 05/20/21 at 2 PM. UC & ED precautions given and pt voiced understanding.sending note to Dr Reece Agar and Misty Stanley CMA and will teams [facility] ?

## 2021-05-20 NOTE — Telephone Encounter (Signed)
[facility] Night - Client ?TELEPHONE ADVICE RECORD ?AccessNurse? ?Patient ?Name: ?[name] ?ORE ?Gender: Female ?DOB: 08/30/1970 ?Age: 51 Y 36 M 7 D ?Return ?Phone ?Number: ?[phone] ?(Primary) ?Address: ?City/ ?State/ ?Zip: ?[place] [address] ?Client [facility] Night - Client ?Client Site [facility] - Night ?Contact Type Call ?Who Is Calling Patient / Member / Family / Caregiver ?Call Type Triage / Clinical ?Caller Name Tine Mabee ?Relationship To Patient Mother ?Return Phone Number [phone] (Primary) ?Chief Complaint Vaginal Bleeding ?Reason for Call Symptomatic / Request for Health Information ?Initial Comment caller states daughter started blood thinner pills ?and pt is currently experiencing menopause and ?started menstrual cycle that is really heavy after ?not having one for so long ?Translation No ?Nurse Assessment ?Nurse: Evonnie Dawes RN, Cala Bradford Date/Time (Eastern Time): 05/19/2021 5:09:45 PM ?Confirm and document reason for call. If ?symptomatic, describe symptoms. ?---Caller states daughter started Eliquis 1 week ago for ?a blood clot in her leg and has not had a period since ?approximately December. She started menstrual cycle ?that is really heavy on Sunday after not having one for ?so long. ?Does the patient have any new or worsening ?symptoms? ---Yes ?Will a triage be completed? ---Yes ?Related visit to physician within the last 2 weeks? ---Yes ?Does the PT have any chronic conditions? (i.e. ?diabetes, asthma, this includes High risk factors for ?pregnancy, etc.) ?---Yes ?List chronic conditions. ---[name], blood clot, ARDS-previous ?trach, sleep apnea ?Is the patient pregnant or possibly pregnant? (Ask ?all females between the ages of 42-55) ---No ?Is this a behavioral health or substance abuse call? ---No ?Guidelines ?Guideline Title Affirmed Question Affirmed Notes Nurse Date/Time (Eastern ?Time) ?Vaginal Bleeding  - ?Abnormal ?Taking Coumadin ?(warfarin) or other ?strong blood thinner, ?or known bleeding ?Evonnie Dawes, RN, ?Cala Bradford ?05/19/2021 5:13:38 ?PM ?PLEASE NOTE: All timestamps contained within this report are represented as [place] Standard Time. ?CONFIDENTIALTY NOTICE: This fax transmission is intended only for the addressee. It contains information that is legally privileged, confidential or ?otherwise protected from use or disclosure. If you are not the intended recipient, you are strictly prohibited from reviewing, disclosing, copying using ?or disseminating any of this information or taking any action in reliance on or regarding this information. If you have received this fax in error, please ?notify us immediately by telephone so that we can arrange for its return to [place]. Phone: [phone], Toll-Free: [phone], Fax: [phone] ?Page: 2 of 2 ?Call Id: [id number] ?Guidelines ?Guideline Title Affirmed Question Affirmed Notes Nurse Date/Time (Eastern ?Time) ?disorder (e.g., ?thrombocytopenia) ?Disp. Time (Eastern ?Time) Disposition Final User ?05/19/2021 5:17:54 PM See PCP within 24 Hours Yes Daves, RN, Cala Bradford ?Caller Disagree/Comply Comply ?Caller Understands Yes ?PreDisposition Call Doctor ?Care Advice Given Per Guideline ?SEE PCP WITHIN 24 HOURS: * IF OFFICE WILL BE OPEN: You need to be examined within the next 24 hours. Call your ?doctor (or NP/PA) when the office opens and make an appointment. TAKING COUMADIN: * Coumadin can reduce your ability to ?form blood clots and thus may increase vaginal bleeding. * You should have a test to check that the Coumadin is not thinning your ?blood too much (Tests: Protime and INR). CALL BACK IF: * Severe abdomen pain or dizziness occurs * Bleeding increases * You ?become worse CARE ADVICE given per Vaginal Bleeding, Abnormal (Adult) guideline. ?Referrals ?REFERRED TO PCP OFFIC ?

## 2021-05-22 ENCOUNTER — Encounter (INDEPENDENT_AMBULATORY_CARE_PROVIDER_SITE_OTHER): Payer: Self-pay | Admitting: Nurse Practitioner

## 2021-05-22 NOTE — Progress Notes (Signed)
? ?Subjective:  ? ? Patient ID: [name], female    DOB: 09-Jun-1970, 51 y.o.   MRN: [id number] ?No chief complaint on file. ? ? ?[name] is a 51 year old female that returns today for noninvasive studies with chronic lower extremity swelling.  This has been an ongoing issue for months.  The patient currently has Down syndrome and her mother is her primary caretaker and provides much of the history.  As previously noted the patient is not very active and her legs tend to remain dependent much of the day.  Currently no open wounds or infection but the legs are edematous.  No known previous DVT or superficial thrombophlebitis. ? ?Today noninvasive studies show evidence of deep venous insufficiency in the bilateral lower extremities.  Left lower extremity also has reflux in the great saphenous vein.  There is an also an incidental finding of a partially occlusive, acute to subacute thrombus in the left mid distal popliteal vein ? ? ? ?Review of Systems  ?Cardiovascular:  Positive for leg swelling.  ?All other systems reviewed and are negative. ? ?   ?Objective:  ? Physical Exam ?Vitals reviewed.  ?HENT:  ?   Head: Normocephalic.  ?Cardiovascular:  ?   Rate and Rhythm: Normal rate.  ?Pulmonary:  ?   Effort: Pulmonary effort is normal.  ?Musculoskeletal:  ?   Right lower leg: 2+ Edema present.  ?   Left lower leg: 2+ Edema present.  ?Skin: ?   General: Skin is warm and dry.  ?Neurological:  ?   Mental Status: She is alert and oriented to person, place, and time. Mental status is at baseline.  ?Psychiatric:     ?   Mood and Affect: Mood normal.     ?   Behavior: Behavior normal.     ?   Thought Content: Thought content normal.     ?   Judgment: Judgment normal.  ? ? ?BP 95/64 (BP Location: Left Arm)   Pulse 61   Resp 16   Ht 4\' 10"  (1.473 m)   Wt 249 lb (112.9 kg)   BMI 52.04 kg/m?  ? ?Past Medical History:  ?Diagnosis Date  ? Dementia due to another medical condition [facility])   ? Trisomy 21-related, sees [facility]  yearly, nl CT head 2010, did not tolerate aricept  ? Depression   ? with OCD  ? DJD (degenerative joint disease) of knee   ? bilat knee arthritis, had 3/3 Supartz injections 2011)  ? Down syndrome   ? Dry ARMD 02/2013  ? Eczema   ? Empyema of left pleural space (HCC) 09/24/2011  ? Hypothyroid   ? OCD (obsessive compulsive disorder)   ? OSA on CPAP 05/2012  ? severe OSA with AHI 118, desat nadir to 67%  ? Pneumonia   ? multiple pneumonias  ? Presenile dementia, uncomplicated (HCC)   ? Seizure (HCC) 2013  ? x 2 ; "very mild"; during hospitalization "while coming off vent"  ? ? ?Social History  ? ?Socioeconomic History  ? Marital status: Single  ?  Spouse name: Not on file  ? Number of children: 0  ? Years of education: Not on file  ? Highest education level: Not on file  ?Occupational History  ? Not on file  ?Tobacco Use  ? Smoking status: Never  ? Smokeless tobacco: Never  ?Vaping Use  ? Vaping Use: Never used  ?Substance and Sexual Activity  ? Alcohol use: No  ? Drug use: No  ?  Sexual activity: Never  ?Other Topics Concern  ? Not on file  ?Social History Narrative  ? Lives with mom and dad, dogs  ? Stays at daycare during the day  ? Activity: walking some  ? Diet: good water, fruits/vegetables daily  ? ?Social Determinants of Health  ? ?Financial Resource Strain: Not on file  ?Food Insecurity: Not on file  ?Transportation Needs: Not on file  ?Physical Activity: Not on file  ?Stress: Not on file  ?Social Connections: Not on file  ?Intimate Partner Violence: Not on file  ? ? ?Past Surgical History:  ?Procedure Laterality Date  ? ACHILLES TENDON REPAIR Right 01/2017  ? [facility]  ? ANTERIOR CRUCIATE LIGAMENT REPAIR Left   ? FLEXIBLE BRONCHOSCOPY  09/24/2011  ? Procedure: FLEXIBLE BRONCHOSCOPY;  Surgeon: Purcell Nails, MD;  Location: [facility] OR;  Service: Thoracic;  Laterality: N/A;  ? LUMBAR DISC SURGERY    ? ruptured disc  ? TRACHEOSTOMY TUBE PLACEMENT  10/11/2011  ? Procedure: TRACHEOSTOMY;   Surgeon: Serena Colonel, MD;  Location: [facility] OR;  Service: ENT;  Laterality: N/A;  ? VIDEO ASSISTED THORACOSCOPY (VATS) /THOROCOTOMY/RADIOACTIVE SEED IMPLANT Left 09/24/2011  ? VATS, (Dr. Barry Dienes)  ? ? ?Family History  ?Problem Relation Age of Onset  ? Arthritis Mother   ? Hypertension Father   ? Diabetes Father   ? Cancer Neg Hx   ? Coronary artery disease Neg Hx   ? Stroke Neg Hx   ? ? ?No Known Allergies ? ? ?  Latest Ref Rng & Units 05/20/2021  ?  2:45 PM 10/11/2020  ?  9:01 AM 10/06/2019  ?  4:55 PM  ?CBC  ?WBC 3.8 - 10.8 Thousand/uL 4.8   4.1   7.4    ?Hemoglobin 11.7 - 15.5 g/dL 77.8   24.2   35.3    ?Hematocrit 35.0 - 45.0 % 39.7   39.8   42.6    ?Platelets 140 - 400 Thousand/uL 202   209.0   265    ? ? ? ? ?CMP  ?   ?Component Value Date/Time  ? NA 140 10/11/2020 0901  ? K 4.7 10/11/2020 0901  ? CL 103 10/11/2020 0901  ? CO2 28 10/11/2020 0901  ? GLUCOSE 127 (H) 10/11/2020 0901  ? BUN 14 10/11/2020 0901  ? CREATININE 1.05 10/11/2020 0901  ? CALCIUM 8.9 10/11/2020 0901  ? PROT 6.6 10/11/2020 0901  ? ALBUMIN 3.7 10/11/2020 0901  ? AST 20 10/11/2020 0901  ? ALT 24 10/11/2020 0901  ? ALKPHOS 55 10/11/2020 0901  ? BILITOT 0.4 10/11/2020 0901  ? GFRNONAA >60 10/06/2019 1655  ? GFRAA >60 10/06/2019 1655  ? ? ? ?No results found. ? ?   ?Assessment & Plan:  ? ?1. Swelling of limb ?As noted below the patient is advised to wear medical grade compression stockings to be placed first thing in the morning and take it off at bedtime.  She is also advised to elevate lower extremities when possible. ?- VAS [place] LOWER EXTREMITY VENOUS REFLUX ? ?2. Acute deep vein thrombosis (DVT) of popliteal vein of left lower extremity (HCC) ?The patient has noted to have continued pain with significant swelling in the left upper extremity.  We will have the patient begin on Eliquis.  Patient is advised to utilize medical grade compression stocking as well as to elevate her legs to help with the edema.  We will have the patient return in 6 weeks to  ensure that there is  no provocation. ? ? ?Current Outpatient Medications on File Prior to Visit  ?Medication Sig Dispense Refill  ? allopurinol (ZYLOPRIM) 100 MG tablet Take 1 tablet (100 mg total) by mouth daily. 90 tablet 3  ? Cholecalciferol (VITAMIN D3 PO) Take 500 Units by mouth daily.    ? clobetasol cream (TEMOVATE) 0.05 % Apply topically 2 (two) times daily as needed (skin rash). Don't use more than 2 weeks at a time, avoid face, underarms, groin. 45 g 0  ? ibuprofen (ADVIL) 800 MG tablet TAKE 1 TABLET BY MOUTH THREE TIMES A DAY 90 tablet 1  ? ketoconazole (NIZORAL) 2 % cream Apply 1 application topically 2 (two) times daily as needed for irritation. 60 g 1  ? ketoconazole (NIZORAL) 2 % shampoo     ? levETIRAcetam (KEPPRA) 500 MG tablet Take 1 tablet (500 mg total) by mouth 2 (two) times daily. 180 tablet 3  ? levothyroxine (SYNTHROID) 125 MCG tablet TAKE 1 TABLET BY MOUTH EVERY DAY BEFORE BREAKFAST 90 tablet 1  ? memantine (NAMENDA) 10 MG tablet Take 1 tablet (10 mg total) by mouth 2 (two) times daily. 180 tablet 3  ? Multiple Vitamins-Minerals (WOMENS MULTI PO) Take 1 tablet by mouth daily.     ? sertraline (ZOLOFT) 50 MG tablet TAKE 1 TABLET BY MOUTH EVERYDAY AT BEDTIME 90 tablet 3  ? triamcinolone ointment (KENALOG) 0.1 % APPLY TO THE BODY NIGHTLY (NEVER THE FACE OR GROIN)    ? ?No current facility-administered medications on file prior to visit.  ? ? ?There are no Patient Instructions on file for this visit. ?No follow-ups on file. ? ? ?Georgiana SpinnerFallon E Kimble Hitchens, NP ? ? ?

## 2021-05-23 ENCOUNTER — Telehealth: Payer: Self-pay | Admitting: Family Medicine

## 2021-05-23 DIAGNOSIS — N8501 Benign endometrial hyperplasia: Secondary | ICD-10-CM | POA: Insufficient documentation

## 2021-05-23 DIAGNOSIS — Z86718 Personal history of other venous thrombosis and embolism: Secondary | ICD-10-CM | POA: Insufficient documentation

## 2021-05-23 DIAGNOSIS — N939 Abnormal uterine and vaginal bleeding, unspecified: Secondary | ICD-10-CM | POA: Insufficient documentation

## 2021-05-23 DIAGNOSIS — I82432 Acute embolism and thrombosis of left popliteal vein: Secondary | ICD-10-CM | POA: Insufficient documentation

## 2021-05-23 NOTE — Telephone Encounter (Signed)
Lvm asking pt's mom, [name] (on dpr), to call back.  Need to relay Dr. Timoteo Expose message.  ?

## 2021-05-23 NOTE — Assessment & Plan Note (Signed)
Perimenopausal vaginal bleeding in setting of recent eliquis loading dose with planned eliquis for 3 months. Discussed ddx including fibroid, cancer, other. Recommend GYN referral to discuss sedated GYN exam - mom declines, aware of risks of missed diagnosis. Will check CBC and trans-abdominal pelvic ultrasound as pt has previously been unable to tolerate vaginal exam.  ?They do agree, however, if recurrent bleed to seek GYN care.  ?

## 2021-05-23 NOTE — Telephone Encounter (Signed)
I don't see they've read MyChart message. Plz notify:  ?Your blood counts returned ok - without anemia. This is reassuring.  ?I would like to check a trans-abdominal pelvic ultrasound to see if we find any obvious fibroid or other cause for bleeding - this is not as detailed an imaging study as a trans-vaginal ultrasound would be, however I think it is a good place to start. We will call you to schedule this. ?Again as discussed, if ongoing bleeding, we will need to see GYN to discuss further evaluation.  ?

## 2021-05-23 NOTE — Assessment & Plan Note (Signed)
Acute to subacute partially occlusive L mid distal popliteal vein on venous ultrasound along with bilateral venous reflux. Currently on eliquis 5mg  bid, just completed loading dose. She is also regularly wearing compression stockings with benefit. Appreciate [facility] care. ?

## 2021-05-24 NOTE — Telephone Encounter (Signed)
Spoke to patient's mom [name] (on DPR) and was advised that she has read the [employer]. [name] stated that [name] has stopped the bleeding. [name] stated that she will continue to monitor [name] and will call back if the bleeding starts back. ?

## 2021-05-24 NOTE — Telephone Encounter (Signed)
PLEASE NOTE: All timestamps contained within this report are represented as [place] Standard Time. ?CONFIDENTIALTY NOTICE: This fax transmission is intended only for the addressee. It contains information that is legally privileged, confidential or otherwise protected from ?use or disclosure. If you are not the intended recipient, you are strictly prohibited from reviewing, disclosing, copying using or disseminating any of this information or taking any ?action in reliance on or regarding this information. If you have received this fax in error, please notify us immediately by telephone so that we can arrange for its return to [place]. ?Phone: [phone], Toll-Free: [phone], Fax: [phone] ?Page: 1 of 1 ?Call Id: [id number] ?[facility] Night - Client ?Nonclinical Telephone Record  ?AccessNurse? ?Client [facility] Night - Client ?Client Site [facility] - Night ?Provider Eustaquio Boyden - MD ?Contact Type Call ?Who Is Calling Patient / Member / Family / Caregiver ?Caller Name Emalea Polinski ?Caller Phone Number [phone] ?Call Type Message Only Information Provided ?Reason for Call Returning a Call from the Office ?Initial Comment caller states that the nurse from the office called her and she missed call from lisa Mariame ?[place] ?Disp. Time Disposition Final User ?05/23/2021 5:46:35 PM General Information Provided Yes Quillian Quince ?Call Closed By: Quillian Quince ?Transaction Date/Time: 05/23/2021 5:44:33 PM (ET) ?

## 2021-05-27 ENCOUNTER — Other Ambulatory Visit: Payer: Medicare PPO

## 2021-05-31 ENCOUNTER — Other Ambulatory Visit: Payer: Self-pay | Admitting: Family Medicine

## 2021-05-31 ENCOUNTER — Ambulatory Visit
Admission: RE | Admit: 2021-05-31 | Discharge: 2021-05-31 | Disposition: A | Discharge status: Home / Self Care | Payer: Medicare PPO | Source: Ambulatory Visit | Attending: Family Medicine | Admitting: Family Medicine

## 2021-05-31 DIAGNOSIS — N939 Abnormal uterine and vaginal bleeding, unspecified: Secondary | ICD-10-CM

## 2021-06-07 ENCOUNTER — Other Ambulatory Visit: Payer: Self-pay

## 2021-06-07 ENCOUNTER — Encounter (HOSPITAL_COMMUNITY): Payer: Self-pay | Admitting: Emergency Medicine

## 2021-06-07 ENCOUNTER — Emergency Department (HOSPITAL_COMMUNITY)
Admission: EM | Admit: 2021-06-07 | Discharge: 2021-06-07 | Disposition: A | Discharge status: Home / Self Care | Payer: Medicare PPO | Attending: Emergency Medicine | Admitting: Emergency Medicine

## 2021-06-07 DIAGNOSIS — M25562 Pain in left knee: Secondary | ICD-10-CM | POA: Diagnosis not present

## 2021-06-07 DIAGNOSIS — Z79899 Other long term (current) drug therapy: Secondary | ICD-10-CM | POA: Diagnosis not present

## 2021-06-07 DIAGNOSIS — F0390 Unspecified dementia without behavioral disturbance: Secondary | ICD-10-CM | POA: Insufficient documentation

## 2021-06-07 DIAGNOSIS — I82402 Acute embolism and thrombosis of unspecified deep veins of left lower extremity: Secondary | ICD-10-CM | POA: Diagnosis not present

## 2021-06-07 DIAGNOSIS — Z7901 Long term (current) use of anticoagulants: Secondary | ICD-10-CM | POA: Insufficient documentation

## 2021-06-07 DIAGNOSIS — E039 Hypothyroidism, unspecified: Secondary | ICD-10-CM | POA: Diagnosis not present

## 2021-06-07 NOTE — Discharge Instructions (Addendum)
Information has been provided for you to obtain an outpatient ultrasound DVT study of your left leg.  The instructions provided you with a phone number and information on how to schedule your appointment.  This will not be through the ED, as this is in a separate location of [facility] facility.  You should receive a phone call in the morning to set up your appointment.  If you do not hear from them by around lunchtime then feel free to give them a call. ? ?Continue taking your Eliquis as prescribed.   ? ?Follow-up with your primary care within the next 3 to 4 days for reevaluation and continued medical management. ? ?Return to the ED for new or worsening symptoms as discussed. ?

## 2021-06-07 NOTE — ED Triage Notes (Signed)
Pt was diagnosed with a blood clot behind her left knee 3 weeks ago. Pt was put on eliquis and has taken it for 3 weeks. Pt reports increased pain in her left knee today.  ?

## 2021-06-07 NOTE — ED Provider Notes (Signed)
?[facility]-EMERGENCY DEPT ?Provider Note ? ? ?CSN: [id number] ?Arrival date & time: 06/07/21  1849 ? ?  ? ?History ? ?Chief Complaint  ?Patient presents with  ? Knee Pain  ? blood clot  ? ? ?Christine Maudry MayhewM Goulette is a 51 y.o. female with chief complaint of left knee pain.  Diagnosed 3 weeks ago with DVT of the left knee.  Was placed on Eliquis.  Patient states that discoloration of the left lower leg has improved, but began feeling significant increased pain of the left knee a few hours ago.  Denies nausea or vomiting.  Denies recent illness or fever.  Denies shortness of breath, chest pain, lightheadedness, or dizziness.  Hx of chronic gout, MDD, seizures, hypothyroidism, prediabetes, and presenile dementia.  Denies numbness and tingling of the lower extremities. ? ?Hx of Down syndrome. ? ?The history is provided by the patient, medical records and a relative (Mother).  ?Knee Pain ? ?  ? ?Home Medications ?Prior to Admission medications   ?Medication Sig Start Date End Date Taking? Authorizing Provider  ?allopurinol (ZYLOPRIM) 100 MG tablet Take 1 tablet (100 mg total) by mouth daily. 03/24/21   Christine BoydenGutierrez, Javier, MD  ?apixaban (ELIQUIS) 5 MG TABS tablet Take 1 tablet (5 mg total) by mouth 2 (two) times daily. 05/11/21   Christine SpinnerBrown, Fallon E, NP  ?Cholecalciferol (VITAMIN D3 PO) Take 500 Units by mouth daily.    [provider]  ?clobetasol cream (TEMOVATE) 0.05 % Apply topically 2 (two) times daily as needed (skin rash). Don't use more than 2 weeks at a time, avoid face, underarms, groin. 04/27/21   Christine BoydenGutierrez, Javier, MD  ?ibuprofen (ADVIL) 800 MG tablet TAKE 1 TABLET BY MOUTH THREE TIMES A DAY 01/03/21   Christine ShellingEvans, Brent Sosa, DPM  ?ketoconazole (NIZORAL) 2 % cream Apply 1 application topically 2 (two) times daily as needed for irritation. 10/28/18   Christine SchwalbeLetvak, Richard I, MD  ?ketoconazole (NIZORAL) 2 % shampoo  09/08/16   [provider]  ?levETIRAcetam (KEPPRA) 500 MG tablet Take 1 tablet (500 mg total)  by mouth 2 (two) times daily. 03/31/21   Christine AustinMcCue, Jessica, NP  ?levothyroxine (SYNTHROID) 125 MCG tablet TAKE 1 TABLET BY MOUTH EVERY DAY BEFORE BREAKFAST 03/07/21   Christine BoydenGutierrez, Javier, MD  ?memantine (NAMENDA) 10 MG tablet Take 1 tablet (10 mg total) by mouth 2 (two) times daily. 03/31/21   Christine AustinMcCue, Jessica, NP  ?Multiple Vitamins-Minerals (WOMENS MULTI PO) Take 1 tablet by mouth daily.     [provider]  ?sertraline (ZOLOFT) 50 MG tablet TAKE 1 TABLET BY MOUTH EVERYDAY AT BEDTIME 04/25/21   Christine AustinMcCue, Jessica, NP  ?triamcinolone ointment (KENALOG) 0.1 % APPLY TO THE BODY NIGHTLY (NEVER THE FACE OR GROIN) 05/14/18   [provider]  ?   ? ?Allergies    ?Patient has no known allergies.   ? ?Review of Systems   ?Review of Systems  ?Musculoskeletal:   ?     Left knee pain  ? ?Physical Exam ?Updated Vital Signs ?BP (!) 128/47   Pulse 69   Temp 98 ?F (36.7 ?C)   Resp 16   Ht 4\' 10"  (1.473 Sosa)   Wt 113.4 kg   SpO2 95%   BMI 52.25 kg/Sosa?  ?Physical Exam ?Vitals and nursing note reviewed.  ?Constitutional:   ?   General: She is not in acute distress. ?   Appearance: She is well-developed.  ?HENT:  ?   Head: Normocephalic and atraumatic.  ?   Mouth/Throat:  ?  Mouth: Mucous membranes are moist.  ?   Pharynx: Oropharynx is clear.  ?Eyes:  ?   Conjunctiva/sclera: Conjunctivae normal.  ?Cardiovascular:  ?   Rate and Rhythm: Normal rate and regular rhythm.  ?   Pulses: Normal pulses.  ?   Heart sounds: Normal heart sounds. No murmur heard. ?Pulmonary:  ?   Effort: Pulmonary effort is normal. No respiratory distress.  ?   Breath sounds: Normal breath sounds.  ?Abdominal:  ?   General: Bowel sounds are normal.  ?   Palpations: Abdomen is soft.  ?   Tenderness: There is no abdominal tenderness.  ?Musculoskeletal:     ?   General: No swelling.  ?   Cervical back: Neck supple.  ?Skin: ?   General: Skin is warm and dry.  ?   Capillary Refill: Capillary refill takes less than 2 seconds.  ?Neurological:  ?   Mental Status: She  is alert.  ?   GCS: GCS eye subscore is 4. GCS verbal subscore is 5. GCS motor subscore is 6.  ?   Sensory: Sensation is intact.  ?Psychiatric:     ?   Mood and Affect: Mood normal.  ? ? ?ED Results / Procedures / Treatments   ?Labs ?(all labs ordered are listed, but only abnormal results are displayed) ?Labs Reviewed - No data to display ? ?EKG ?None ? ?Radiology ?None  ? ?Procedures ?Procedures  ? ? ?Medications Ordered in ED ?Medications - No data to display ? ?ED Course/ Medical Decision Making/ A&P ?  ?                        ?Medical Decision Making ?Amount and/or Complexity of Data Reviewed ?External Data Reviewed: notes. ?Labs:  Decision-making details documented in ED Course. ?Radiology:  Decision-making details documented in ED Course. ?ECG/medicine tests:  Decision-making details documented in ED Course. ? ? ?51 y.o. female presents to the ED for concern of Knee Pain and blood clot.  This involves an extensive number of treatment options, and is a complaint that carries with it a high risk of complications and morbidity.  The differential diagnosis includes DVT migration, cellulitis, septic arthritis, gout ? ?Comorbidities that complicate the patient evaluation include chronic gout, MDD, seizures, Down Syndrome, hypothyroidism, prediabetes, and presenile dementia ? ?Additional history obtained from internal/external records available via epic ? ?Interpretation: ?Test Considered: None ?  ?Critical Interventions: None ?  ?Consultations Obtained: None ? ?Intervention: ?Sosa considered providing pain medication, but the patient is already taking Eliquis and cannot take NSAIDs at this time.  In addition the patient has already been taking Tylenol with moderate relief. ? ?ED Course: ?Pt with mild swelling to the joint spaces, knee swelling, tightness in the knee, and mildly restricted range of motion that is supposedly mildly worsened from previous visit. Pt unable to perform full flexion of the knee.  Pt is  without systemic symptoms, erythema or redness of the joint consistent with gout or septic joint or cellulitis.  Patient is not tachycardic, without shortness of breath, nondiaphoretic, without hemoptysis or fever, and no recent history of IV drug use.  Not suspicious of pulmonary embolism.  Suspicious of potential local migration of pre-existing DVT located near the left knee joint.  Believe the patient would benefit from an outpatient ultrasound Doppler of the left lower extremity for further evaluation.  Instructed patient to continue taking Eliquis and keep follow-up with prescribing provider.  Patient will be dc home &  is agreeable with above plan. ? ?Emergency department workup does not suggest an emergent condition requiring admission or immediate intervention beyond  what has been performed at this time.  The patient is safe for discharge and has been instructed to return immediately for worsening symptoms, change in symptoms or any other concerns ? ?Disposition: ?Sosa discussed the patient and their case with my attending, Dr. Silverio Lay, who agreed with the proposed treatment course.  After consideration of the diagnostic results and the patient's response to treatment, Sosa feel that the patient would benefit from patient ultrasound Doppler of the left extremity tomorrow and follow-up with her primary provider.  Discussed course of treatment thoroughly with the patient and her mother, who demonstrated understanding.  Patient in agreement and has no further questions. ? ? ?This chart was dictated using voice recognition software.  Despite best efforts to proofread,  errors can occur which can change the documentation meaning. ? ? ? ? ? ? ? ?Final Clinical Impression(s) / ED Diagnoses ?Final diagnoses:  ?Left knee pain, unspecified chronicity  ?Deep vein thrombosis (DVT) of left lower extremity, unspecified chronicity, unspecified vein (HCC)  ? ? ?Rx / DC Orders ?ED Discharge Orders   ? ?      Ordered  ?  LE VENOUS        ? 06/07/21 2115  ? ?  ?  ? ?  ? ? ?  ?Cecil Cobbs, PA-C ?06/09/21 0029 ? ?  ?Charlynne Pander, MD ?06/09/21 1451 ? ?

## 2021-06-08 ENCOUNTER — Ambulatory Visit (HOSPITAL_COMMUNITY)
Admission: RE | Admit: 2021-06-08 | Discharge: 2021-06-08 | Disposition: A | Discharge status: Home / Self Care | Payer: Medicare PPO | Source: Ambulatory Visit | Attending: Emergency Medicine | Admitting: Emergency Medicine

## 2021-06-08 ENCOUNTER — Telehealth: Payer: Self-pay

## 2021-06-08 DIAGNOSIS — I82409 Acute embolism and thrombosis of unspecified deep veins of unspecified lower extremity: Secondary | ICD-10-CM

## 2021-06-08 DIAGNOSIS — M79605 Pain in left leg: Secondary | ICD-10-CM | POA: Diagnosis not present

## 2021-06-08 DIAGNOSIS — R609 Edema, unspecified: Secondary | ICD-10-CM | POA: Diagnosis not present

## 2021-06-08 DIAGNOSIS — I82432 Acute embolism and thrombosis of left popliteal vein: Secondary | ICD-10-CM | POA: Insufficient documentation

## 2021-06-08 DIAGNOSIS — M7989 Other specified soft tissue disorders: Secondary | ICD-10-CM | POA: Diagnosis not present

## 2021-06-08 DIAGNOSIS — R52 Pain, unspecified: Secondary | ICD-10-CM | POA: Insufficient documentation

## 2021-06-08 DIAGNOSIS — Z86718 Personal history of other venous thrombosis and embolism: Secondary | ICD-10-CM | POA: Diagnosis not present

## 2021-06-08 NOTE — Telephone Encounter (Signed)
[facility] - Client ?TELEPHONE ADVICE RECORD ?AccessNurse? ?Patient ?Name: ?[name] ?ORE ?Gender: Female ?DOB: November 12, 1970 ?Age: 51 Y 10 M 26 D ?Return ?Phone ?Number: ?[id number] ?(Primary) ?Address: ?City/ ?State/ ?Zip: [place] ?[address] ?Client [facility] - Client ?Client Site [place] [facility] ?Provider Ria Bush - MD ?Contact Type Call ?Who Is Calling Patient / Member / Family / Caregiver ?Call Type Triage / Clinical ?Caller Name [name] ?Relationship To Patient Mother ?Return Phone Number [phone] (Primary) ?Chief Complaint Leg Pain ?Reason for Call Symptomatic / Request for Health Information ?Initial Comment Caller states her daughter has Cherlyn Cushing Syndrome ?and has a blood clot behind her knee on the left ?leg. She is complaining of pain today. ?Translation No ?Nurse Assessment ?Nurse: Laqueta Due, RN, Metallurgist (Eastern Time): 06/07/2021 4:44:52 PM ?Confirm and document reason for call. If ?symptomatic, describe symptoms. ?---Pt has a blood clot behind her left knee, diagnosed ?about 3 weeks ago, hasn't finished first sample pack of ?blood thinner. having pain today of the left knee. ?Does the patient have any new or worsening ?symptoms? ---Yes ?Will a triage be completed? ---Yes ?Related visit to physician within the last 2 weeks? ---Yes ?Does the PT have any chronic conditions? (i.e. ?diabetes, asthma, this includes High risk factors for ?pregnancy, etc.) ?---Yes ?List chronic conditions. ---[name], possible fibroids ?Is the patient pregnant or possibly pregnant? (Ask ?all females between the ages of 61-55) ---No ?Is this a behavioral health or substance abuse call? ---No ?Guidelines ?Guideline Title Affirmed Question Affirmed Notes Nurse Date/Time (Eastern ?Time) ?Leg Pain History of prior ?"blood clot" in leg ?or lungs (i.e., deep ?vein thrombosis, ?pulmonary embolism) ?Laqueta Due, RN, ?[place] ?06/07/2021  4:45:16 ?PM ?PLEASE NOTE: All timestamps contained within this report are represented as [place] Standard Time. ?CONFIDENTIALTY NOTICE: This fax transmission is intended only for the addressee. It contains information that is legally privileged, confidential or ?otherwise protected from use or disclosure. If you are not the intended recipient, you are strictly prohibited from reviewing, disclosing, copying using ?or disseminating any of this information or taking any action in reliance on or regarding this information. If you have received this fax in error, please ?notify us immediately by telephone so that we can arrange for its return to [place]. Phone: [phone], Toll-Free: [phone], Fax: [phone] ?Page: 2 of 2 ?Call Id: [id number] ?Disp. Time (Eastern ?Time) Disposition Final User ?06/07/2021 4:53:05 PM See HCP within 4 Hours (or ?PCP triage) ?Yes Whiteley, RN, Museum/gallery conservator ?Caller Disagree/Comply Comply ?Caller Understands Yes ?PreDisposition Did not know what to do ?Care Advice Given Per Guideline ?SEE HCP (OR PCP TRIAGE) WITHIN 4 HOURS: CALL BACK IF: * You become worse CARE ADVICE given per Leg Pain ?(Adult) guideline. ?Comments ?User: Letta Moynahan, RN Date/Time Eilene Ghazi Time): 06/07/2021 4:53:59 PM ?Office is about to close, there wouldn't be any appointments available, advised for pt to be seen at [facility] or ER. ?Referrals ?GO TO FACILITY UNDECIDE ?

## 2021-06-08 NOTE — Telephone Encounter (Signed)
It likely hasn't moved if she has been taking her medication as prescribed.  I suspect she's leaking because she's swelling due to the DVT.  Has she been wearing compression as prescribed? If not that that likely has a lot to do with it as well.  We can bring her in to check for worsening Dvt and likely unna wraps

## 2021-06-08 NOTE — Telephone Encounter (Signed)
Per chart review pt went to [facility] ED on 06/07/21. Sending note to Dr Reece Agar and Misty Stanley CMA. ?

## 2021-06-08 NOTE — Telephone Encounter (Signed)
Called pts mother no answer, Called pt no answer left VM to call office back  ?

## 2021-06-08 NOTE — Telephone Encounter (Signed)
It looks like the blood thinners have helped to resolve the clot, I suspect she just needs unna boots to help since she's swelling ?

## 2021-06-08 NOTE — Telephone Encounter (Signed)
Left VM for them to call the office back or come in today before 4 to have unna boots placed. If we do not hear anything we will reach out to make an appt in the am. ?

## 2021-06-08 NOTE — Telephone Encounter (Signed)
Pt was seen in the ED and was sent to [facility] who told them that the blood clot was gone and the results were available for the Dr at [facility] to see.  Please look at results of tests and advise what to do next ?

## 2021-06-08 NOTE — Telephone Encounter (Signed)
Patient's mother called in reference to her daughter having pain  in the back of her left knee and leaking.  Mother is worried and does not know what to do. She stated her daughter has a blood clot in the left leg and wondering if the clot has moved.  Please advise. ?

## 2021-06-09 ENCOUNTER — Ambulatory Visit (INDEPENDENT_AMBULATORY_CARE_PROVIDER_SITE_OTHER): Payer: Medicare PPO | Admitting: Nurse Practitioner

## 2021-06-09 ENCOUNTER — Encounter (INDEPENDENT_AMBULATORY_CARE_PROVIDER_SITE_OTHER): Payer: Self-pay | Admitting: Nurse Practitioner

## 2021-06-09 ENCOUNTER — Telehealth: Payer: Self-pay | Admitting: Family Medicine

## 2021-06-09 VITALS — BP 115/72 | HR 74 | Resp 16 | Wt 246.0 lb

## 2021-06-09 DIAGNOSIS — M7989 Other specified soft tissue disorders: Secondary | ICD-10-CM | POA: Diagnosis not present

## 2021-06-09 DIAGNOSIS — I83029 Varicose veins of left lower extremity with ulcer of unspecified site: Secondary | ICD-10-CM | POA: Diagnosis not present

## 2021-06-09 DIAGNOSIS — I83019 Varicose veins of right lower extremity with ulcer of unspecified site: Secondary | ICD-10-CM | POA: Diagnosis not present

## 2021-06-09 DIAGNOSIS — N939 Abnormal uterine and vaginal bleeding, unspecified: Secondary | ICD-10-CM

## 2021-06-09 DIAGNOSIS — R9389 Abnormal findings on diagnostic imaging of other specified body structures: Secondary | ICD-10-CM

## 2021-06-09 NOTE — Progress Notes (Signed)
History of Present Illness  There is no documented history at this time  Assessments & Plan   There are no diagnoses linked to this encounter.    Additional instructions  Subjective:  Patient presents with venous ulcer of the Bilateral lower extremity.    Procedure:  3 layer unna wrap was placed Bilateral lower extremity.   Plan:   Follow up in one week.  

## 2021-06-09 NOTE — Telephone Encounter (Signed)
Pt mother called checking on the status of her Gynecologist referral. Please advise. ?

## 2021-06-13 ENCOUNTER — Encounter (INDEPENDENT_AMBULATORY_CARE_PROVIDER_SITE_OTHER): Payer: Self-pay | Admitting: Nurse Practitioner

## 2021-06-13 NOTE — Telephone Encounter (Signed)
Pt mother called checking the status of pt referral. Please advise. ?

## 2021-06-16 ENCOUNTER — Encounter (INDEPENDENT_AMBULATORY_CARE_PROVIDER_SITE_OTHER): Payer: Self-pay

## 2021-06-16 ENCOUNTER — Ambulatory Visit (INDEPENDENT_AMBULATORY_CARE_PROVIDER_SITE_OTHER): Payer: Medicare PPO | Admitting: Nurse Practitioner

## 2021-06-16 VITALS — BP 121/75 | HR 80 | Resp 16 | Wt 243.0 lb

## 2021-06-16 DIAGNOSIS — I83029 Varicose veins of left lower extremity with ulcer of unspecified site: Secondary | ICD-10-CM

## 2021-06-16 DIAGNOSIS — I83019 Varicose veins of right lower extremity with ulcer of unspecified site: Secondary | ICD-10-CM | POA: Diagnosis not present

## 2021-06-16 DIAGNOSIS — M7989 Other specified soft tissue disorders: Secondary | ICD-10-CM

## 2021-06-16 NOTE — Progress Notes (Signed)
History of Present Illness  There is no documented history at this time  Assessments & Plan   There are no diagnoses linked to this encounter.    Additional instructions  Subjective:  Patient presents with venous ulcer of the Bilateral lower extremity.    Procedure:  3 layer unna wrap was placed Bilateral lower extremity.   Plan:   Follow up in one week.  

## 2021-06-21 ENCOUNTER — Encounter: Payer: Self-pay | Admitting: *Deleted

## 2021-06-21 NOTE — Addendum Note (Signed)
Addended by: Ria Bush on: 06/21/2021 05:49 PM ? ? Modules accepted: Orders ? ?

## 2021-06-21 NOTE — Telephone Encounter (Addendum)
Per my last message on pelvic US result dated 4/7 I was waiting to hear back from mom to ensure she agreed to GYN referral before placing it. I never heard back that she agreed to referral.  ?I have gone ahead and placed GYN referral.  ?

## 2021-06-21 NOTE — Telephone Encounter (Signed)
There is no GYN Referral in Epic for this patient.  ? ?I was just now able to review this message that was sent to me on 06/09/21 and again on 06/13/21 requesting an update on a GYN referral. After review of the chart, there is no GYN referral for this patient in [place]. This was not reviewed prior to sending this message to me which has caused delay. Please review the request from the patient and place referral to GYN if needed. Thank you.  ? ?

## 2021-06-23 ENCOUNTER — Ambulatory Visit (INDEPENDENT_AMBULATORY_CARE_PROVIDER_SITE_OTHER): Payer: Medicare PPO | Admitting: Nurse Practitioner

## 2021-06-23 VITALS — BP 137/81 | HR 62 | Resp 16 | Ht <= 58 in | Wt 245.0 lb

## 2021-06-23 DIAGNOSIS — M7989 Other specified soft tissue disorders: Secondary | ICD-10-CM | POA: Diagnosis not present

## 2021-06-23 DIAGNOSIS — I83019 Varicose veins of right lower extremity with ulcer of unspecified site: Secondary | ICD-10-CM | POA: Diagnosis not present

## 2021-06-23 NOTE — Progress Notes (Signed)
History of Present Illness  There is no documented history at this time  Assessments & Plan   There are no diagnoses linked to this encounter.    Additional instructions  Subjective:  Patient presents with venous ulcer of the Right lower extremity.    Procedure:  3 layer unna wrap was placed Right lower extremity.   Plan:   Follow up in one week.   

## 2021-06-26 ENCOUNTER — Encounter (INDEPENDENT_AMBULATORY_CARE_PROVIDER_SITE_OTHER): Payer: Self-pay | Admitting: Nurse Practitioner

## 2021-06-30 ENCOUNTER — Ambulatory Visit (INDEPENDENT_AMBULATORY_CARE_PROVIDER_SITE_OTHER): Payer: Medicare PPO | Admitting: Nurse Practitioner

## 2021-06-30 ENCOUNTER — Encounter (INDEPENDENT_AMBULATORY_CARE_PROVIDER_SITE_OTHER): Payer: Self-pay | Admitting: Nurse Practitioner

## 2021-06-30 VITALS — BP 143/82 | HR 65 | Resp 16 | Wt 245.0 lb

## 2021-06-30 DIAGNOSIS — I82432 Acute embolism and thrombosis of left popliteal vein: Secondary | ICD-10-CM

## 2021-06-30 DIAGNOSIS — N939 Abnormal uterine and vaginal bleeding, unspecified: Secondary | ICD-10-CM | POA: Diagnosis not present

## 2021-06-30 MED ORDER — SILVER SULFADIAZINE 1 % EX CREA: 1.0000 "application " | TOPICAL_CREAM | Freq: Every day | CUTANEOUS | 0 refills | Status: DC

## 2021-07-12 ENCOUNTER — Ambulatory Visit (INDEPENDENT_AMBULATORY_CARE_PROVIDER_SITE_OTHER): Payer: Medicare PPO | Admitting: Podiatry

## 2021-07-12 ENCOUNTER — Encounter: Payer: Self-pay | Admitting: Podiatry

## 2021-07-12 DIAGNOSIS — B351 Tinea unguium: Secondary | ICD-10-CM

## 2021-07-12 DIAGNOSIS — M79674 Pain in right toe(s): Secondary | ICD-10-CM | POA: Diagnosis not present

## 2021-07-12 DIAGNOSIS — I872 Venous insufficiency (chronic) (peripheral): Secondary | ICD-10-CM

## 2021-07-12 DIAGNOSIS — M79675 Pain in left toe(s): Secondary | ICD-10-CM

## 2021-07-12 NOTE — Progress Notes (Signed)
This patient returns to my office for at risk foot care.  This patient requires this care by a professional since this patient will be at risk due to having venous stasis and diabetes.  This patient is unable to cut nails herself since the patient cannot reach her nails.These nails are painful walking and wearing shoes.  This patient presents for at risk foot care today.  General Appearance  Alert, conversant and in no acute stress.  Vascular  Dorsalis pedis and posterior tibial  pulses are not palpable due to swelling. bilaterally.  Capillary return is within normal limits  bilaterally. Temperature is within normal limits  bilaterally. Venous stasis legs  B/l.  Neurologic  Senn-Weinstein monofilament wire test within normal limits  bilaterally. Muscle power within normal limits bilaterally.  Nails Thick disfigured discolored nails with subungual debris  from hallux to fifth toes bilaterally. No evidence of bacterial infection or drainage bilaterally.  Orthopedic  No limitations of motion  feet .  No crepitus or effusions noted.  No bony pathology or digital deformities noted.  Skin  normotropic skin with no porokeratosis noted bilaterally.  No signs of infections or ulcers noted.     Onychomycosis  Pain in right toes  Pain in left toes  Consent was obtained for treatment procedures.   Mechanical debridement of nails 1-5  bilaterally performed with a nail nipper.  Filed with dremel without incident.    Return office visit   4 months                   Told patient to return for periodic foot care and evaluation due to potential at risk complications.   Jerral Mccauley DPM   

## 2021-07-14 ENCOUNTER — Other Ambulatory Visit: Payer: Self-pay | Admitting: Adult Health

## 2021-07-14 ENCOUNTER — Other Ambulatory Visit: Payer: Self-pay | Admitting: Family Medicine

## 2021-07-16 ENCOUNTER — Encounter (INDEPENDENT_AMBULATORY_CARE_PROVIDER_SITE_OTHER): Payer: Self-pay | Admitting: Nurse Practitioner

## 2021-07-16 NOTE — Progress Notes (Signed)
Subjective:    Patient ID: [name], female    DOB: 1970-10-21, 51 y.o.   MRN: [id number] Chief Complaint  Patient presents with   Follow-up    Unna wrap follow up    [name] is a 51 year old female that returns today for follow-up evaluation of lower extremity edema and DVT.  The patient initially follow-up with [place] for left lower extremity swelling.  It was noted that she had a DVT in the popliteal area of her left lower extremity.  She experienced several issues such as vaginal bleeding was placed on Eliquis.  She also had significant leg swelling with weeping.  There are upcoming further testing to the vaginal bleeding by her OB/GYN.  The patient was placed in Unna wraps and the swelling has improved.  Today she has complaints of severe ankle pain due to known tendinitis.   Review of Systems  Cardiovascular:  Positive for leg swelling.  Musculoskeletal:  Positive for myalgias.  All other systems reviewed and are negative.     Objective:   Physical Exam Vitals reviewed.  HENT:     Head: Normocephalic.  Cardiovascular:     Rate and Rhythm: Normal rate.  Pulmonary:     Effort: Pulmonary effort is normal.  Musculoskeletal:     Right lower leg: Edema present.     Left lower leg: Edema present.  Skin:    General: Skin is warm and dry.  Neurological:     Mental Status: She is alert and oriented to person, place, and time.  Psychiatric:        Mood and Affect: Mood normal.        Behavior: Behavior normal.        Thought Content: Thought content normal.        Judgment: Judgment normal.    BP (!) 143/82 (BP Location: Right Arm)   Pulse 65   Resp 16   Wt 245 lb (111.1 kg)   BMI 51.21 kg/m   Past Medical History:  Diagnosis Date   Dementia due to another medical condition (HCC)    Trisomy 21-related, sees Sethi yearly, nl CT head 2010, did not tolerate aricept   Depression    with OCD   DJD (degenerative joint disease) of knee    bilat knee arthritis, had  3/3 Supartz injections [name])   Down syndrome    Dry ARMD 02/2013   Eczema    Empyema of left pleural space (HCC) 09/24/2011   Hypothyroid    OCD (obsessive compulsive disorder)    OSA on CPAP 05/2012   severe OSA with AHI 118, desat nadir to 67%   Pneumonia    multiple pneumonias   Presenile dementia, uncomplicated (HCC)    Seizure (HCC) 2013   x 2 ; "very mild"; during hospitalization "while coming off vent"    Social History   Socioeconomic History   Marital status: Single    Spouse name: Not on file   Number of children: 0   Years of education: Not on file   Highest education level: Not on file  Occupational History   Not on file  Tobacco Use   Smoking status: Never   Smokeless tobacco: Never  Vaping Use   Vaping Use: Never used  Substance and Sexual Activity   Alcohol use: No   Drug use: No   Sexual activity: Never  Other Topics Concern   Not on file  Social History Narrative   Lives with mom and  dad, dogs   Stays at daycare during the day   Activity: walking some   Diet: good water, fruits/vegetables daily   Social Determinants of Health   Financial Resource Strain: Not on file  Food Insecurity: Not on file  Transportation Needs: Not on file  Physical Activity: Not on file  Stress: Not on file  Social Connections: Not on file  Intimate Partner Violence: Not on file    Past Surgical History:  Procedure Laterality Date   ACHILLES TENDON REPAIR Right 01/2017   [facility]   ANTERIOR CRUCIATE LIGAMENT REPAIR Left    FLEXIBLE BRONCHOSCOPY  09/24/2011   Procedure: FLEXIBLE BRONCHOSCOPY;  Surgeon: Purcell Nails, MD;  Location: [facility] OR;  Service: Thoracic;  Laterality: N/A;   LUMBAR DISC SURGERY     ruptured disc   TRACHEOSTOMY TUBE PLACEMENT  10/11/2011   Procedure: TRACHEOSTOMY;  Surgeon: Serena Colonel, MD;  Location: [facility] OR;  Service: ENT;  Laterality: N/A;   VIDEO ASSISTED THORACOSCOPY (VATS) /THOROCOTOMY/RADIOACTIVE SEED IMPLANT Left  09/24/2011   [facility], (Dr. Barry Dienes)    Family History  Problem Relation Age of Onset   Arthritis Mother    Hypertension Father    Diabetes Father    Cancer Neg Hx    Coronary artery disease Neg Hx    Stroke Neg Hx     No Known Allergies     Latest Ref Rng & Units 05/20/2021    2:45 PM 10/11/2020    9:01 AM 10/06/2019    4:55 PM  CBC  WBC 3.8 - 10.8 Thousand/uL 4.8   4.1   7.4    Hemoglobin 11.7 - 15.5 g/dL 56.2   13.0   86.5    Hematocrit 35.0 - 45.0 % 39.7   39.8   42.6    Platelets 140 - 400 Thousand/uL 202   209.0   265        CMP     Component Value Date/Time   NA 140 10/11/2020 0901   K 4.7 10/11/2020 0901   CL 103 10/11/2020 0901   CO2 28 10/11/2020 0901   GLUCOSE 127 (H) 10/11/2020 0901   BUN 14 10/11/2020 0901   CREATININE 1.05 10/11/2020 0901   CALCIUM 8.9 10/11/2020 0901   PROT 6.6 10/11/2020 0901   ALBUMIN 3.7 10/11/2020 0901   AST 20 10/11/2020 0901   ALT 24 10/11/2020 0901   ALKPHOS 55 10/11/2020 0901   BILITOT 0.4 10/11/2020 0901   GFRNONAA >60 10/06/2019 1655   GFRAA >60 10/06/2019 1655     No results found.     Assessment & Plan:   1. Deep vein thrombosis (DVT) of popliteal vein of left lower extremity, unspecified chronicity (HCC) Previous studies done at [facility] show that there is resolution of the DVT in the left lower extremity.  The patient has noted significant vaginal bleeding and this has been difficult for her since she has not had a menstrual cycle for years.  Given that the DVT was only in the popliteal space that she has been on anticoagulation for 3 months, we will advised patient to cease anticoagulation at this time.  We will have the patient return in 3 months for reflux study in order to better evaluate possible reflux given that the previous reflux study showed the presence of an acute DVT.  2. Vaginal bleeding Patient will continue to follow-up with OB/GYN.  Hopefully cessation of Eliquis may help this.   Current  Outpatient Medications on File  Prior to Visit  Medication Sig Dispense Refill   allopurinol (ZYLOPRIM) 100 MG tablet Take 1 tablet (100 mg total) by mouth daily. 90 tablet 3   apixaban (ELIQUIS) 5 MG TABS tablet Take 1 tablet (5 mg total) by mouth 2 (two) times daily. 60 tablet 3   Cholecalciferol (VITAMIN D3 PO) Take 500 Units by mouth daily.     clobetasol cream (TEMOVATE) 0.05 % Apply topically 2 (two) times daily as needed (skin rash). Don't use more than 2 weeks at a time, avoid face, underarms, groin. 45 g 0   ibuprofen (ADVIL) 800 MG tablet TAKE 1 TABLET BY MOUTH THREE TIMES A DAY 90 tablet 1   ketoconazole (NIZORAL) 2 % cream Apply 1 application topically 2 (two) times daily as needed for irritation. 60 g 1   ketoconazole (NIZORAL) 2 % shampoo      levETIRAcetam (KEPPRA) 500 MG tablet Take 1 tablet (500 mg total) by mouth 2 (two) times daily. 180 tablet 3   memantine (NAMENDA) 10 MG tablet Take 1 tablet (10 mg total) by mouth 2 (two) times daily. 180 tablet 3   Multiple Vitamins-Minerals (WOMENS MULTI PO) Take 1 tablet by mouth daily.      sertraline (ZOLOFT) 50 MG tablet TAKE 1 TABLET BY MOUTH EVERYDAY AT BEDTIME 90 tablet 3   triamcinolone ointment (KENALOG) 0.1 % APPLY TO THE BODY NIGHTLY (NEVER THE FACE OR GROIN)     No current facility-administered medications on file prior to visit.    There are no Patient Instructions on file for this visit. No follow-ups on file.   Georgiana SpinnerFallon E Jaszmine Navejas, NP

## 2021-07-26 ENCOUNTER — Telehealth: Payer: Self-pay | Admitting: Family Medicine

## 2021-07-26 DIAGNOSIS — I82432 Acute embolism and thrombosis of left popliteal vein: Secondary | ICD-10-CM

## 2021-07-26 DIAGNOSIS — N939 Abnormal uterine and vaginal bleeding, unspecified: Secondary | ICD-10-CM

## 2021-07-26 DIAGNOSIS — R9389 Abnormal findings on diagnostic imaging of other specified body structures: Secondary | ICD-10-CM

## 2021-07-26 MED ORDER — ASPIRIN 81 MG PO TBEC: 81.0000 mg | DELAYED_RELEASE_TABLET | Freq: Every day | ORAL | Status: AC

## 2021-07-26 NOTE — Telephone Encounter (Signed)
See notes below access nurse note; sending to Dr Reece Agar and Misty Stanley CMA and will teams lisa.

## 2021-07-26 NOTE — Telephone Encounter (Signed)
[name] (Mother) called again asking for her daughter "about an update on the referral, also stated the clots are now the size of a golf ball and she is concerned." Please return a call back when possible, thanks.  Callback Number: [phone]

## 2021-07-26 NOTE — Addendum Note (Signed)
Addended by: Eustaquio Boyden on: 07/26/2021 05:07 PM   Modules accepted: Orders

## 2021-07-26 NOTE — Telephone Encounter (Signed)
[name] (Mother) called about her daughter having "clotting" in the bed, she is concerned and I transferred her to Access Nurse to speak with someone.  Callback Number: [phone]

## 2021-07-26 NOTE — Telephone Encounter (Signed)
See other note

## 2021-07-26 NOTE — Telephone Encounter (Addendum)
Spoke with mom - bleeding is worse today, passing big clots.  No dizziness or dyspneic or chest pain.  Eliquis was just stopped on Friday.  Recommend she come tomorrow am for lab visit (CBC).  Will order stat GYN referral for evaluation.  ER precautions reviewed.  Recent DVT complicates treatment options.

## 2021-07-26 NOTE — Telephone Encounter (Signed)
Reason for Referral Request: Clotting  Has patient been seen PCP for this complaint? Yes  Patient scheduled on: Last seen on 05/20/2021 for this same reason.   Referral for which specialty: Gynecology   Preferred office/provider: N/A. Access Nurse stated pt has been trying for months to be seen, has not heard back. Stated she needs to be seen within 24 hours.

## 2021-07-26 NOTE — Telephone Encounter (Signed)
Fyi to Dr. Reece Agar.  (See other phn note)

## 2021-07-26 NOTE — Telephone Encounter (Signed)
[facility] Day - Client TELEPHONE ADVICE RECORD AccessNurse Patient Name: [name] Gender: Female DOB: Mar 31, 1970 Age: 51 Y 10 M 14 D Return Phone Number: [phone] (Primary) Address: City/ State/ Zip: [place] [address] Client [facility] Day - Client Client Site [facility] - Day Provider Eustaquio Boyden - MD Contact Type Call Who Is Calling Patient / Member / Family / Caregiver Call Type Triage / Clinical Caller Name [name] Relationship To Patient Mother Return Phone Number [phone] (Primary) Chief Complaint Vaginal Bleeding Reason for Call Symptomatic / Request for Health Information Initial Comment Caller states her daughter is handicap. She has been waking up with vaginal bleeding. The bleeding is running down her leg. Translation No Nurse Assessment Nurse: Charna Elizabeth, RN, Cathy Date/Time (Eastern Time): 07/26/2021 10:21:41 AM Confirm and document reason for call. If symptomatic, describe symptoms. ---Mother states [name] has had unusual, irregular vaginal bleeding for the past 2-3 months. She developed increased, heavy vaginal bleeding with large blood clots last night. No fever. No pain. Alert and responsive. Does the patient have any new or worsening symptoms? ---Yes Will a triage be completed? ---Yes Related visit to physician within the last 2 weeks? ---Yes Does the PT have any chronic conditions? (i.e. diabetes, asthma, this includes High risk factors for pregnancy, etc.) ---Yes List chronic conditions. ---Scheduled to see GYN August 31, 2021 for "shaded uterus on recent ultrasound", Leaky Veins in legs, Overweight, Gout Is the patient pregnant or possibly pregnant? (Ask all females between the ages of 9-55) ---No Is this a behavioral health or substance abuse call? ---No Guidelines Guideline Title Affirmed Question Affirmed Notes Nurse Date/Time (Eastern Time) Vaginal  Bleeding - Abnormal Taking Coumadin (warfarin) or other strong blood thinner, Trumbull, RN, Lynden Ang 07/26/2021 10:25:12 AM PLEASE NOTE: All timestamps contained within this report are represented as [place] Standard Time. CONFIDENTIALTY NOTICE: This fax transmission is intended only for the addressee. It contains information that is legally privileged, confidential or otherwise protected from use or disclosure. If you are not the intended recipient, you are strictly prohibited from reviewing, disclosing, copying using or disseminating any of this information or taking any action in reliance on or regarding this information. If you have received this fax in error, please notify us immediately by telephone so that we can arrange for its return to [place]. Phone: [phone], Toll-Free: [phone], Fax: [phone] Page: 2 of 2 Call Id: [id number] Guidelines Guideline Title Affirmed Question Affirmed Notes Nurse Date/Time Lamount Cohen Time) or known bleeding disorder (e.g., thrombocytopenia) Disp. Time Lamount Cohen Time) Disposition Final User 07/26/2021 10:27:55 AM See PCP within 24 Hours Yes Trumbull, RN, Lynden Ang Caller Disagree/Comply Comply Caller Understands Yes PreDisposition Call Doctor Care Advice Given Per Guideline SEE PCP WITHIN 24 HOURS: * IF OFFICE WILL BE OPEN: You need to be examined within the next 24 hours. Call your doctor (or NP/PA) when the office opens and make an appointment. CALL BACK IF: * Severe abdomen pain or dizziness occurs * Bleeding increases * You become worse CARE ADVICE given per Vaginal Bleeding, Abnormal (Adult) guideline. TAKING COUMADIN: * Coumadin can reduce your ability to form blood clots and thus may increase vaginal bleeding. * You should have a test to check that the Coumadin is not thinning your blood too much (Tests: Protime and INR). Comments User: Colonel Bald, RN Date/Time Lamount Cohen Time): 07/26/2021 10:34:14 AM Transferred [name] to the main office  number per her request since there was no answer via office backline.  Aida Raider to call the GYN office to see if they have any cancellations, openings or a way to see her. Encouraged to call back with any new or worsening symptoms. Referrals REFERRED TO PCP OFFIC

## 2021-07-26 NOTE — Telephone Encounter (Signed)
Lvm asking pt's mom, [name] (on dpr) to call back.  Need to relay [name]'s message below (was sent to pt's MyChart on 06/21/21).   06/21/21 MyChart msg: June 21, 2021 [name] Fus, [place] to [name]      6:12 PM Good evening,    I have sent your GYN referral to    [facility] [address] [place], [place] [address] Phone: [phone]   You can call them to schedule your appointment.   Please let us know if there is anything further needed.    --Nadara Eaton, CMA Referral Coordinator

## 2021-07-27 ENCOUNTER — Other Ambulatory Visit (INDEPENDENT_AMBULATORY_CARE_PROVIDER_SITE_OTHER): Payer: Medicare PPO

## 2021-07-27 ENCOUNTER — Telehealth: Payer: Self-pay | Admitting: Obstetrics and Gynecology

## 2021-07-27 DIAGNOSIS — N939 Abnormal uterine and vaginal bleeding, unspecified: Secondary | ICD-10-CM | POA: Diagnosis not present

## 2021-07-27 LAB — BASIC METABOLIC PANEL
BUN: 19 mg/dL (ref 6–23)
CO2: 28 mEq/L (ref 19–32)
Calcium: 8.7 mg/dL (ref 8.4–10.5)
Chloride: 106 mEq/L (ref 96–112)
Creatinine, Ser: 1.11 mg/dL (ref 0.40–1.20)
GFR: 57.81 mL/min — ABNORMAL LOW (ref >60.00)
Glucose, Bld: 109 mg/dL — ABNORMAL HIGH (ref 70–99)
Potassium: 4.4 mEq/L (ref 3.5–5.1)
Sodium: 141 mEq/L (ref 135–145)

## 2021-07-27 LAB — CBC WITH DIFFERENTIAL/PLATELET
Basophils Absolute: 0.1 10*3/uL (ref 0.0–0.1)
Basophils Relative: 2.1 % (ref 0.0–3.0)
Eosinophils Absolute: 0.1 10*3/uL (ref 0.0–0.7)
Eosinophils Relative: 1.9 % (ref 0.0–5.0)
HCT: 36.4 % (ref 36.0–46.0)
Hemoglobin: 12.2 g/dL (ref 12.0–15.0)
Lymphocytes Relative: 22.8 % (ref 12.0–46.0)
Lymphs Abs: 1.2 10*3/uL (ref 0.7–4.0)
MCHC: 33.4 g/dL (ref 30.0–36.0)
MCV: 105.6 fl — ABNORMAL HIGH (ref 78.0–100.0)
Monocytes Absolute: 0.4 10*3/uL (ref 0.1–1.0)
Monocytes Relative: 8.4 % (ref 3.0–12.0)
Neutro Abs: 3.4 10*3/uL (ref 1.4–7.7)
Neutrophils Relative %: 64.8 % (ref 43.0–77.0)
Platelets: 203 10*3/uL (ref 150.0–400.0)
RBC: 3.44 Mil/uL — ABNORMAL LOW (ref 3.87–5.11)
RDW: 15.5 % (ref 11.5–15.5)
WBC: 5.3 10*3/uL (ref 4.0–10.5)

## 2021-07-27 NOTE — Telephone Encounter (Signed)
Pt mom [name] called back and said that she had talked to [facility] in [place] and that they told her that they couldn't get her in any sooner than 08/31/21. She wanted to know what they can do next and also wanted to know if lab results for today had came back yet. Please return call at [phone]

## 2021-07-27 NOTE — Telephone Encounter (Addendum)
This referral was sent on 06/21/2021 - see notes in referral from April. I am documenting this in the new referral and closing it as this referral has already been handled.    Mychart message was sent to patient making her aware that this referral was sent to them and that she could contact them to schedule if she did not hear from them.   I have contacted the patient Mother [name] and let her know that she needs to contact [facility] to schedule. [name] stated that the patient is already scheduled and needs an appt sooner than scheduled 08/31/21. I have tried calling their office several times and cannot get anyone on the phone.   [name] states that they will go anywhere in [place] if [facility] is not able to work her in sooner.   Pt has had lab work done this week to check for anemia d/t heavy bleeding/clotting.  Recent US Pelvis (TV) - Abnormal findings   Called [facility] in [place] to see when the first available appt is. They are going to look into the referral and let us know. Waiting for a call back

## 2021-07-27 NOTE — Telephone Encounter (Signed)
Pt's mom, [name], is aware of pt's recent lab results and Dr. Timoteo Expose message about referral.  She expresses her thanks for trying to get pt seen sooner.

## 2021-07-27 NOTE — Telephone Encounter (Signed)
Staff from [facility] called asking if we could see pt urgently- she has been having abnormal bleeding and labs showed anemia. Pt has down syndrome we would need to speak with pt's mother. Please advise for scheduling as schedules are booked into September at the moment.

## 2021-08-04 ENCOUNTER — Other Ambulatory Visit (INDEPENDENT_AMBULATORY_CARE_PROVIDER_SITE_OTHER): Payer: Self-pay | Admitting: Nurse Practitioner

## 2021-08-04 DIAGNOSIS — I82432 Acute embolism and thrombosis of left popliteal vein: Secondary | ICD-10-CM

## 2021-08-04 NOTE — Telephone Encounter (Signed)
Spoke with pt's mom, [name] (on dpr), asking for update.  Says pt stopped bleeding about 2 days after stopping Eliquis and has not bled since.  States they will still keep OB/GYN appt on 08/31/21.

## 2021-08-04 NOTE — Telephone Encounter (Addendum)
Plz call for update on vaginal bleeding off of eliquis.

## 2021-08-08 ENCOUNTER — Encounter (INDEPENDENT_AMBULATORY_CARE_PROVIDER_SITE_OTHER): Payer: Self-pay | Admitting: Ophthalmology

## 2021-08-08 ENCOUNTER — Ambulatory Visit (INDEPENDENT_AMBULATORY_CARE_PROVIDER_SITE_OTHER): Payer: Medicare PPO | Admitting: Ophthalmology

## 2021-08-08 DIAGNOSIS — H269 Unspecified cataract: Secondary | ICD-10-CM

## 2021-08-08 DIAGNOSIS — H30893 Other chorioretinal inflammations, bilateral: Secondary | ICD-10-CM | POA: Diagnosis not present

## 2021-08-08 DIAGNOSIS — H35373 Puckering of macula, bilateral: Secondary | ICD-10-CM

## 2021-08-08 DIAGNOSIS — H33102 Unspecified retinoschisis, left eye: Secondary | ICD-10-CM

## 2021-08-08 NOTE — Progress Notes (Signed)
[facility] - Clinic Note  08/08/2021     CHIEF COMPLAINT Patient presents for Retina Follow Up   HISTORY OF PRESENT ILLNESS: [name] is a 51 y.o. female who presents to the clinic today for:   HPI     Retina Follow Up   Patient presents with  Other.  In both eyes.  This started months ago.  Duration of 3 months.  I, the attending physician,  performed the HPI with the patient and updated documentation appropriately.        Comments   Patient's mother states that their is no vision changes that the patient has complained about.       Last edited by Rennis ChrisZamora, Devina Bezold, MD on 08/10/2021 12:02 AM.      Referring physician:   HISTORICAL INFORMATION:   Selected notes from the MEDICAL RECORD NUMBER Referred by Dr. Nedra HaiLEE:  Ocular Hx- granulomatous chorioretinitis OU -- formerly followed by Dr. Everlena CooperJaffe annually -- pt stable and wanted to transfer ophthalmologic care closer to home PMH-previously followed by Dr. Ashley RoyaltyMatthews (05.15.12) and [facility]    CURRENT MEDICATIONS: No current outpatient medications on file. (Ophthalmic Drugs)   No current facility-administered medications for this visit. (Ophthalmic Drugs)   Current Outpatient Medications (Other)  Medication Sig   allopurinol (ZYLOPRIM) 100 MG tablet Take 1 tablet (100 mg total) by mouth daily.   aspirin EC 81 MG tablet Take 1 tablet (81 mg total) by mouth daily. Swallow whole.   Cholecalciferol (VITAMIN D3 PO) Take 500 Units by mouth daily.   clobetasol cream (TEMOVATE) 0.05 % Apply topically 2 (two) times daily as needed (skin rash). Don't use more than 2 weeks at a time, avoid face, underarms, groin.   ibuprofen (ADVIL) 800 MG tablet TAKE 1 TABLET BY MOUTH THREE TIMES A DAY   ketoconazole (NIZORAL) 2 % cream Apply 1 application topically 2 (two) times daily as needed for irritation.   ketoconazole (NIZORAL) 2 % shampoo    levETIRAcetam (KEPPRA) 500 MG tablet Take 1 tablet (500 mg  total) by mouth 2 (two) times daily.   levothyroxine (SYNTHROID) 125 MCG tablet TAKE 1 TABLET BY MOUTH EVERY DAY BEFORE BREAKFAST   memantine (NAMENDA) 10 MG tablet Take 1 tablet (10 mg total) by mouth 2 (two) times daily.   Multiple Vitamins-Minerals (WOMENS MULTI PO) Take 1 tablet by mouth daily.    sertraline (ZOLOFT) 50 MG tablet TAKE 1 TABLET BY MOUTH EVERYDAY AT BEDTIME   silver sulfADIAZINE (SILVADENE) 1 % cream Apply 1 application. topically daily.   triamcinolone ointment (KENALOG) 0.1 % APPLY TO THE BODY NIGHTLY (NEVER THE FACE OR GROIN)   No current facility-administered medications for this visit. (Other)   REVIEW OF SYSTEMS: ROS   Positive for: Eyes Negative for: Constitutional, Gastrointestinal, Neurological, Skin, Genitourinary, Musculoskeletal, HENT, Endocrine, Cardiovascular, Respiratory, Psychiatric, Allergic/Imm, Heme/Lymph Last edited by Joni ReiningHodges, Robin, COA on 08/08/2021  3:34 PM.      ALLERGIES No Known Allergies  PAST MEDICAL HISTORY Past Medical History:  Diagnosis Date   Dementia due to another medical condition (HCC)    Trisomy 21-related, sees Sethi yearly, nl CT head 2010, did not tolerate aricept   Depression    with OCD   DJD (degenerative joint disease) of knee    bilat knee arthritis, had 3/3 Supartz injections [name](Kramer)   Down syndrome    Dry ARMD 02/2013   Eczema    Empyema of left pleural space (HCC) 09/24/2011  Hypothyroid    OCD (obsessive compulsive disorder)    OSA on CPAP 05/2012   severe OSA with AHI 118, desat nadir to 67%   Pneumonia    multiple pneumonias   Presenile dementia, uncomplicated (HCC)    Seizure (HCC) 2013   x 2 ; "very mild"; during hospitalization "while coming off vent"   Past Surgical History:  Procedure Laterality Date   ACHILLES TENDON REPAIR Right 01/2017   [facility]   ANTERIOR CRUCIATE LIGAMENT REPAIR Left    FLEXIBLE BRONCHOSCOPY  09/24/2011   Procedure: FLEXIBLE BRONCHOSCOPY;  Surgeon:  Purcell Nails, MD;  Location: [facility] OR;  Service: Thoracic;  Laterality: N/A;   LUMBAR DISC SURGERY     ruptured disc   TRACHEOSTOMY TUBE PLACEMENT  10/11/2011   Procedure: TRACHEOSTOMY;  Surgeon: Serena Colonel, MD;  Location: [facility] OR;  Service: ENT;  Laterality: N/A;   VIDEO ASSISTED THORACOSCOPY (VATS) /THOROCOTOMY/RADIOACTIVE SEED IMPLANT Left 09/24/2011   VATS, (Dr. Barry Dienes)   FAMILY HISTORY Family History  Problem Relation Age of Onset   Arthritis Mother    Hypertension Father    Diabetes Father    Cancer Neg Hx    Coronary artery disease Neg Hx    Stroke Neg Hx    SOCIAL HISTORY Social History   Tobacco Use   Smoking status: Never   Smokeless tobacco: Never  Vaping Use   Vaping Use: Never used  Substance Use Topics   Alcohol use: No   Drug use: No       OPHTHALMIC EXAM:  Base Eye Exam     Visual Acuity (Snellen - Linear)       Right Left   Dist cc 20/60 20/50 -2   Dist ph cc NI NI    Correction: Glasses         Tonometry (Tonopen, 1:56 PM)       Right Left   Pressure 16 18         Pupils       Pupils Dark Light Shape React APD   Right PERRL 3 2 Round Brisk None   Left PERRL 3 2 Round Brisk None         Visual Fields       Left Right    Full Full         Extraocular Movement       Right Left    Full, Ortho Full, Ortho         Neuro/Psych     Oriented x3: Yes   Mood/Affect: Normal         Dilation     Both eyes: 1.0% Mydriacyl, 2.5% Phenylephrine @ 1:53 PM           Slit Lamp and Fundus Exam     Slit Lamp Exam       Right Left   Lids/Lashes Dermatochalasis - upper lid Dermatochalasis - upper lid   Conjunctiva/Sclera White and quiet White and quiet   Cornea 1-2+ Punctate epithelial erosions 1+ inferior Punctate epithelial erosions   Anterior Chamber Deep, narrow temporal angle, no cell or flare Deep, narrow temporal angle, no cell or flare   Iris Round and dilated Round and dilated   Lens 1-2+ Cortical cataract,  pigment on anterior capsule, trace Posterior subcapsular cataract 1+ Cortical cataract   Anterior Vitreous Vitreous syneresis, no cell or pigment Vitreous syneresis, no cell or pigment         Fundus Exam  Right Left   Disc 2+ mild Pallor, Sharp rim, +PPA/PPP 2+ mild Pallor, Sharp rim, +PPA/PPP   C/D Ratio 0.3 0.4   Macula Flat, Blunted foveal reflex, scattered drusen, RPE mottling and clumping Flat, Blunted foveal reflex, mild ERM, RPE mottling, no heme or edema   Vessels attenuated, Tortuous, +pigment clumping along IT arcades attenuated, scattered patches of perivascular pigment clumping   Periphery Attached, peripapillary CR atrophy with pigment clumping extending along arcades, scattered pigmented CR scarring, +drusen, reticular degeneration, No RT/RD, ?shallow schisis IT periphery Attached, peripheral bone spicules ST quad, bullous schisis along IT arcades from 0400-0530, reticular degeneration           Refraction     Wearing Rx       Sphere Cylinder Axis   Right -1.00 +3.00 056   Left +1.25 +1.00 153            IMAGING AND PROCEDURES  Imaging and Procedures for 08/08/2021  OCT, Retina - OU - Both Eyes       Right Eye Quality was borderline. Central Foveal Thickness: 354. Progression has been stable. Findings include no IRF, no SRF, abnormal foveal contour, retinal drusen , epiretinal membrane, macular pucker (ERM with blunted foveal contour and interval progression of pucker inferior macula).   Left Eye Quality was good. Central Foveal Thickness: 336. Progression has been stable. Findings include no IRF, no SRF, abnormal foveal contour, myopic contour, epiretinal membrane (Mild ERM with blunted foveal contour, bullous schisis IT periphery caught on widefield).   Notes *Images captured and stored on drive  Diagnosis / Impression:  OD: ERM with blunted foveal contour and interval progression of pucker inferior macula OS: Mild ERM with blunted foveal  contour, bullous schisis IT periphery caught on widefield  Clinical management:  See below  Abbreviations: NFP - Normal foveal profile. CME - cystoid macular edema. PED - pigment epithelial detachment. IRF - intraretinal fluid. SRF - subretinal fluid. EZ - ellipsoid zone. ERM - epiretinal membrane. ORA - outer retinal atrophy. ORT - outer retinal tubulation. SRHM - subretinal hyper-reflective material. IRHM - intraretinal hyper-reflective material     Fluorescein Angiography Optos (Transit OD)       Right Eye Progression has no prior data. Early phase findings include staining, window defect. Mid/Late phase findings include leakage, staining, window defect (Mild perivascular leakage temporal periphery).   Left Eye Progression has no prior data. Early phase findings include staining, window defect. Mid/Late phase findings include staining, window defect (No leakage).   Notes **Images stored on drive**  Impression: Large patches of window defect and staining OU, consistent with history of granulomatous chorioretinitis OU OD: mild perivascular leakage temporal periphery  OS: no leakage             ASSESSMENT/PLAN:    ICD-10-CM   1. Granulomatous chorioretinitis of both eyes  H30.893 OCT, Retina - OU - Both Eyes    Fluorescein Angiography Optos (Transit OD)    CANCELED: Fluorescein Angiography [place] (Transit OD)    2. Left retinoschisis  H33.102 OCT, Retina - OU - Both Eyes    3. Epiretinal membrane (ERM) of both eyes  H35.373 OCT, Retina - OU - Both Eyes    4. Cortical cataract of both eyes  H26.9     \ 1. Chorioretinitis OU - pt of Dr. Sharee Pimple at [facility], originally referred by [facility] in 2015 - pt has been stable for last several years without active inflammation on no medicationsand has been monitored annually -  due to recent stability, pt and family requested retina follow up closer to home - FA 06.12.23 shows large patches of window defect and staining OU,  consistent with granulomatous chorioretinitis OU.  OD mild vascular leakage temporal periphery, OS no leakage - BCVA 20/70 OD, 20/50 OS -- subjectively and objectively stable ([facility] at [facility] 03/2020 was 20/64 OU) - exam shows pigmented CR scarring OU -- no signs of active inflammation - no retinal or ophthalmic interventions indicated or recommended   - f/u 1 year DFE, OCT, possible FA  2. Retinoschisis OS  - bullous retinoschisis IT periphery OU w/ posterior extension just outside IT arcades  - no associated RT/RD OS  - ?shallow schisis IT periphery OD  - monitor  - f/u 1 year  3. Epiretinal membrane, both eyes  - The natural history, anatomy, potential for loss of vision, and treatment options including vitrectomy techniques and the complications of endophthalmitis, retinal detachment, vitreous hemorrhage, cataract progression and permanent vision loss discussed with the patient. - mild ERM OU w/ blunting of foveal contours - BCVA 20/70 OD, 20/50 OS - asymptomatic, no metamorphopsia - no indication for surgery at this time - monitor for now  4. Cortical Cataract OU - The symptoms of cataract, surgical options, and treatments and risks were discussed with patient. - discussed diagnosis and progression - not yet visually significant - monitor for now   Ophthalmic Meds Ordered this visit:  No orders of the defined types were placed in this encounter.    Return in about 1 year (around 08/09/2022) for DFE, OCT, possible FA.  There are no Patient Instructions on file for this visit.   Explained the diagnoses, plan, and follow up with the patient and they expressed understanding.  Patient expressed understanding of the importance of proper follow up care.   This document serves as a record of services personally performed by Karie Chimera, MD, PhD. It was created on their behalf by Annalee Genta, COMT. The creation of this record is the provider's dictation and/or activities during the  visit.  Electronically signed by: Annalee Genta, COMT 08/10/21 12:06 AM  This document serves as a record of services personally performed by Karie Chimera, MD, PhD. It was created on their behalf by Joni Reining, an ophthalmic technician. The creation of this record is the provider's dictation and/or activities during the visit.    Electronically signed by: Joni Reining COA, 08/10/21  12:06 AM   Karie Chimera, M.D., Ph.D. Diseases & Surgery of the Retina and Vitreous [facility]  I have reviewed the above documentation for accuracy and completeness, and I agree with the above. Karie Chimera, M.D., Ph.D. 08/10/21 12:08 AM   Abbreviations: M myopia (nearsighted); A astigmatism; H hyperopia (farsighted); P presbyopia; Mrx spectacle prescription;  CTL contact lenses; OD right eye; OS left eye; OU both eyes  XT exotropia; ET esotropia; PEK punctate epithelial keratitis; PEE punctate epithelial erosions; DES dry eye syndrome; MGD meibomian gland dysfunction; ATs artificial tears; PFAT's preservative free artificial tears; NSC nuclear sclerotic cataract; PSC posterior subcapsular cataract; ERM epi-retinal membrane; PVD posterior vitreous detachment; RD retinal detachment; DM diabetes mellitus; DR diabetic retinopathy; NPDR non-proliferative diabetic retinopathy; PDR proliferative diabetic retinopathy; CSME clinically significant macular edema; DME diabetic macular edema; dbh dot blot hemorrhages; CWS cotton wool spot; POAG primary open angle glaucoma; C/D cup-to-disc ratio; HVF humphrey visual field; GVF goldmann visual field; OCT optical coherence tomography; IOP intraocular pressure; BRVO Branch retinal vein  occlusion; CRVO central retinal vein occlusion; CRAO central retinal artery occlusion; BRAO branch retinal artery occlusion; RT retinal tear; SB scleral buckle; PPV pars plana vitrectomy; VH Vitreous hemorrhage; PRP panretinal laser photocoagulation; IVK intravitreal  kenalog; VMT vitreomacular traction; MH Macular hole;  NVD neovascularization of the disc; NVE neovascularization elsewhere; AREDS age related eye disease study; ARMD age related macular degeneration; POAG primary open angle glaucoma; EBMD epithelial/anterior basement membrane dystrophy; ACIOL anterior chamber intraocular lens; IOL intraocular lens; PCIOL posterior chamber intraocular lens; Phaco/IOL phacoemulsification with intraocular lens placement; PRK photorefractive keratectomy; LASIK laser assisted in situ keratomileusis; HTN hypertension; DM diabetes mellitus; COPD chronic obstructive pulmonary disease

## 2021-08-10 ENCOUNTER — Encounter (INDEPENDENT_AMBULATORY_CARE_PROVIDER_SITE_OTHER): Payer: Self-pay | Admitting: Ophthalmology

## 2021-08-16 ENCOUNTER — Encounter (INDEPENDENT_AMBULATORY_CARE_PROVIDER_SITE_OTHER): Payer: Medicare PPO

## 2021-08-16 ENCOUNTER — Ambulatory Visit (INDEPENDENT_AMBULATORY_CARE_PROVIDER_SITE_OTHER): Payer: Medicare PPO

## 2021-08-16 ENCOUNTER — Ambulatory Visit (INDEPENDENT_AMBULATORY_CARE_PROVIDER_SITE_OTHER): Payer: Medicare PPO | Admitting: Vascular Surgery

## 2021-08-16 ENCOUNTER — Encounter (INDEPENDENT_AMBULATORY_CARE_PROVIDER_SITE_OTHER): Payer: Self-pay | Admitting: Vascular Surgery

## 2021-08-16 VITALS — BP 100/61 | HR 69 | Resp 16 | Wt 247.0 lb

## 2021-08-16 DIAGNOSIS — R7303 Prediabetes: Secondary | ICD-10-CM

## 2021-08-16 DIAGNOSIS — I83893 Varicose veins of bilateral lower extremities with other complications: Secondary | ICD-10-CM | POA: Diagnosis not present

## 2021-08-16 DIAGNOSIS — Z6841 Body Mass Index (BMI) 40.0 and over, adult: Secondary | ICD-10-CM

## 2021-08-16 DIAGNOSIS — I82432 Acute embolism and thrombosis of left popliteal vein: Secondary | ICD-10-CM

## 2021-08-16 DIAGNOSIS — Q909 Down syndrome, unspecified: Secondary | ICD-10-CM | POA: Diagnosis not present

## 2021-08-16 NOTE — Assessment & Plan Note (Signed)
Reflux study today shows extensive venous reflux in the left great saphenous vein throughout including the saphenofemoral junction.  No DVT is identified. Given her prominent swelling and marked skin changes which would be CEAP class IV on the left leg, I would recommend laser ablation of the left great saphenous vein.  She has previously been treated for DVT and that has resolved.  I have discussed the risks and benefits of the procedure and her mom is agreeable to proceed.

## 2021-08-16 NOTE — Assessment & Plan Note (Signed)
Had 3 months of anticoagulation and is now off anticoagulation appropriately without recurrent DVT

## 2021-08-16 NOTE — Progress Notes (Signed)
MRN : [id number]  [name] is a 51 y.o. (1971/01/13) female who presents with chief complaint of  Chief Complaint  Patient presents with   Follow-up    Ultrasound follow up  .  History of Present Illness: Patient returns today in follow up of venous insufficiency.  She underwent treatment for DVT earlier this year that has resolved.  She is now off of anticoagulation and not having bleeding issues.  She is still bothered by prominent swelling and what seems to be some pain in the left leg although her mom provides much of the history due to her limitations.  The skin changes and swelling are prominent.  Reflux study today shows extensive venous reflux in the left great saphenous vein throughout including the saphenofemoral junction.  No DVT is identified.  Current Outpatient Medications  Medication Sig Dispense Refill   allopurinol (ZYLOPRIM) 100 MG tablet Take 1 tablet (100 mg total) by mouth daily. 90 tablet 3   aspirin EC 81 MG tablet Take 1 tablet (81 mg total) by mouth daily. Swallow whole.     Cholecalciferol (VITAMIN D3 PO) Take 500 Units by mouth daily.     clobetasol cream (TEMOVATE) 0.05 % Apply topically 2 (two) times daily as needed (skin rash). Don't use more than 2 weeks at a time, avoid face, underarms, groin. 45 g 0   ibuprofen (ADVIL) 800 MG tablet TAKE 1 TABLET BY MOUTH THREE TIMES A DAY 90 tablet 1   ketoconazole (NIZORAL) 2 % cream Apply 1 application topically 2 (two) times daily as needed for irritation. 60 g 1   ketoconazole (NIZORAL) 2 % shampoo      levETIRAcetam (KEPPRA) 500 MG tablet Take 1 tablet (500 mg total) by mouth 2 (two) times daily. 180 tablet 3   levothyroxine (SYNTHROID) 125 MCG tablet TAKE 1 TABLET BY MOUTH EVERY DAY BEFORE BREAKFAST 90 tablet 0   memantine (NAMENDA) 10 MG tablet Take 1 tablet (10 mg total) by mouth 2 (two) times daily. 180 tablet 3   Multiple Vitamins-Minerals (WOMENS MULTI PO) Take 1 tablet by mouth daily.      sertraline  (ZOLOFT) 50 MG tablet TAKE 1 TABLET BY MOUTH EVERYDAY AT BEDTIME 90 tablet 3   silver sulfADIAZINE (SILVADENE) 1 % cream Apply 1 application. topically daily. 50 g 0   triamcinolone ointment (KENALOG) 0.1 % APPLY TO THE BODY NIGHTLY (NEVER THE FACE OR GROIN)     No current facility-administered medications for this visit.    Past Medical History:  Diagnosis Date   Dementia due to another medical condition (HCC)    Trisomy 21-related, sees Sethi yearly, nl CT head 2010, did not tolerate aricept   Depression    with OCD   DJD (degenerative joint disease) of knee    bilat knee arthritis, had 3/3 Supartz injections [name])   Down syndrome    Dry ARMD 02/2013   Eczema    Empyema of left pleural space (HCC) 09/24/2011   Hypothyroid    OCD (obsessive compulsive disorder)    OSA on CPAP 05/2012   severe OSA with AHI 118, desat nadir to 67%   Pneumonia    multiple pneumonias   Presenile dementia, uncomplicated (HCC)    Seizure (HCC) 2013   x 2 ; "very mild"; during hospitalization "while coming off vent"    Past Surgical History:  Procedure Laterality Date   ACHILLES TENDON REPAIR Right 01/2017   [facility]   ANTERIOR CRUCIATE  LIGAMENT REPAIR Left    FLEXIBLE BRONCHOSCOPY  09/24/2011   Procedure: FLEXIBLE BRONCHOSCOPY;  Surgeon: Purcell Nails, MD;  Location: [facility] OR;  Service: Thoracic;  Laterality: N/A;   LUMBAR DISC SURGERY     ruptured disc   TRACHEOSTOMY TUBE PLACEMENT  10/11/2011   Procedure: TRACHEOSTOMY;  Surgeon: Serena Colonel, MD;  Location: [facility] OR;  Service: ENT;  Laterality: N/A;   VIDEO ASSISTED THORACOSCOPY (VATS) /THOROCOTOMY/RADIOACTIVE SEED IMPLANT Left 09/24/2011   VATS, (Dr. Barry Dienes)     Social History   Tobacco Use   Smoking status: Never   Smokeless tobacco: Never  Vaping Use   Vaping Use: Never used  Substance Use Topics   Alcohol use: No   Drug use: No       Family History  Problem Relation Age of Onset   Arthritis Mother     Hypertension Father    Diabetes Father    Cancer Neg Hx    Coronary artery disease Neg Hx    Stroke Neg Hx      No Known Allergies  REVIEW OF SYSTEMS (Negative unless checked)   Constitutional: [] Weight loss  [] Fever  [] Chills Cardiac: [] Chest pain   [] Chest pressure   [] Palpitations   [] Shortness of breath when laying flat   [] Shortness of breath at rest   [] Shortness of breath with exertion. Vascular:  [x] Pain in legs with walking   [x] Pain in legs at rest   [] Pain in legs when laying flat   [] Claudication   [] Pain in feet when walking  [] Pain in feet at rest  [] Pain in feet when laying flat   [] History of DVT   [] Phlebitis   [x] Swelling in legs   [] Varicose veins   [] Non-healing ulcers Pulmonary:   [] Uses home oxygen   [] Productive cough   [] Hemoptysis   [] Wheeze  [] COPD   [] Asthma Neurologic:  [] Dizziness  [] Blackouts   [x] Seizures   [] History of stroke   [] History of TIA  [] Aphasia   [] Temporary blindness   [] Dysphagia   [] Weakness or numbness in arms   [] Weakness or numbness in legs Musculoskeletal:  [] Arthritis   [] Joint swelling   [] Joint pain   [] Low back pain Hematologic:  [] Easy bruising  [] Easy bleeding   [] Hypercoagulable state   [] Anemic  [] Hepatitis Gastrointestinal:  [] Blood in stool   [] Vomiting blood  [x] Gastroesophageal reflux/heartburn   [] Abdominal pain Genitourinary:  [] Chronic kidney disease   [] Difficult urination  [] Frequent urination  [] Burning with urination   [] Hematuria Skin:  [] Rashes   [] Ulcers   [] Wounds Psychological:  [] History of anxiety   [x]  History of major depression.  Physical Examination  BP 100/61 (BP Location: Left Arm)   Pulse 69   Resp 16   Wt 247 lb (112 kg)   BMI 51.62 kg/m  Gen:  WD/WN, NAD. obese Head: Al[place]/AT, No temporalis wasting. Ear/Nose/Throat: Hearing grossly intact, nares w/o erythema or drainage Eyes: Conjunctiva clear. Sclera non-icteric Neck: Supple.  Trachea midline Pulmonary:  Good air movement, no use of accessory  muscles.  Cardiac: RRR, no JVD Vascular:  Vessel Right Left  Radial Palpable Palpable           Musculoskeletal: M/S 5/5 throughout.  No deformity or atrophy.  1-2+ right lower extremity edema, 2-3+ left lower extremity edema.  Significant stasis dermatitis changes are present bilaterally worse on the left than the right Neurologic: Sensation grossly intact in extremities.  Symmetrical.   Psychiatric: Judgment intact, Mood & affect appropriate for pt's clinical situation. Dermatologic:  No rashes or ulcers noted.  No cellulitis or open wounds.      Labs Recent Results (from the past 2160 hour(s))  CBC with Differential/Platelet     Status: Abnormal   Collection Time: 05/20/21  2:45 PM  Result Value Ref Range   WBC 4.8 3.8 - 10.8 Thousand/uL   RBC 3.87 3.80 - 5.10 Million/uL   Hemoglobin 13.1 11.7 - 15.5 g/dL   HCT 73.2 20.2 - 54.2 %   MCV 102.6 (H) 80.0 - 100.0 fL   MCH 33.9 (H) 27.0 - 33.0 pg   MCHC 33.0 32.0 - 36.0 g/dL   RDW 70.6 23.7 - 62.8 %   Platelets 202 140 - 400 Thousand/uL   MPV 10.9 7.5 - 12.5 fL   Neutro Abs 2,866 1,500 - 7,800 cells/uL   Lymphs Abs 1,291 850 - 3,900 cells/uL   Absolute Monocytes 470 200 - 950 cells/uL   Eosinophils Absolute 82 15 - 500 cells/uL   Basophils Absolute 91 0 - 200 cells/uL   Neutrophils Relative % 59.7 %   Total Lymphocyte 26.9 %   Monocytes Relative 9.8 %   Eosinophils Relative 1.7 %   Basophils Relative 1.9 %  Basic metabolic panel     Status: Abnormal   Collection Time: 07/27/21  7:57 AM  Result Value Ref Range   Sodium 141 135 - 145 mEq/L   Potassium 4.4 3.5 - 5.1 mEq/L   Chloride 106 96 - 112 mEq/L   CO2 28 19 - 32 mEq/L   Glucose, Bld 109 (H) 70 - 99 mg/dL   BUN 19 6 - 23 mg/dL   Creatinine, Ser 3.15 0.40 - 1.20 mg/dL   GFR 17.61 (L) >60.73 mL/min    Comment: Calculated using the CKD-EPI Creatinine Equation (2021)   Calcium 8.7 8.4 - 10.5 mg/dL  CBC with Differential/Platelet     Status: Abnormal   Collection  Time: 07/27/21  7:57 AM  Result Value Ref Range   WBC 5.3 4.0 - 10.5 K/uL   RBC 3.44 (L) 3.87 - 5.11 Mil/uL   Hemoglobin 12.2 12.0 - 15.0 g/dL   HCT 71.0 62.6 - 94.8 %   MCV 105.6 (H) 78.0 - 100.0 fl   MCHC 33.4 30.0 - 36.0 g/dL   RDW 54.6 27.0 - 35.0 %   Platelets 203.0 150.0 - 400.0 K/uL   Neutrophils Relative % 64.8 43.0 - 77.0 %   Lymphocytes Relative 22.8 12.0 - 46.0 %   Monocytes Relative 8.4 3.0 - 12.0 %   Eosinophils Relative 1.9 0.0 - 5.0 %   Basophils Relative 2.1 0.0 - 3.0 %   Neutro Abs 3.4 1.4 - 7.7 K/uL   Lymphs Abs 1.2 0.7 - 4.0 K/uL   Monocytes Absolute 0.4 0.1 - 1.0 K/uL   Eosinophils Absolute 0.1 0.0 - 0.7 K/uL   Basophils Absolute 0.1 0.0 - 0.1 K/uL    Radiology OCT, Retina - OU - Both Eyes  Result Date: 08/10/2021 Right Eye Quality was borderline. Central Foveal Thickness: 354. Progression has been stable. Findings include no IRF, no SRF, abnormal foveal contour, retinal drusen , epiretinal membrane, macular pucker (ERM with blunted foveal contour and interval progression of pucker inferior macula). Left Eye Quality was good. Central Foveal Thickness: 336. Progression has been stable. Findings include no IRF, no SRF, abnormal foveal contour, myopic contour, epiretinal membrane (Mild ERM with blunted foveal contour, bullous schisis IT periphery caught on widefield). Notes *Images captured and stored on drive Diagnosis / Impression: OD:  ERM with blunted foveal contour and interval progression of pucker inferior macula OS: Mild ERM with blunted foveal contour, bullous schisis IT periphery caught on widefield Clinical management: See below Abbreviations: NFP - Normal foveal profile. CME - cystoid macular edema. PED - pigment epithelial detachment. IRF - intraretinal fluid. SRF - subretinal fluid. EZ - ellipsoid zone. ERM - epiretinal membrane. ORA - outer retinal atrophy. ORT - outer retinal tubulation. SRHM - subretinal hyper-reflective material. IRHM - intraretinal  hyper-reflective material  Fluorescein Angiography Optos (Transit OD)  Result Date: 08/10/2021 Right Eye Progression has no prior data. Early phase findings include staining, window defect. Mid/Late phase findings include leakage, staining, window defect (Mild perivascular leakage temporal periphery). Left Eye Progression has no prior data. Early phase findings include staining, window defect. Mid/Late phase findings include staining, window defect (No leakage). Notes **Images stored on drive** Impression: Large patches of window defect and staining OU, consistent with history of granulomatous chorioretinitis OU OD: mild perivascular leakage temporal periphery OS: no leakage    Assessment/Plan Prediabetes blood glucose control important in reducing the progression of atherosclerotic disease. Also, involved in wound healing. On appropriate medications.     Down syndrome This certainly limits her functional status somewhat, although she has been very functional and survived to the age of 52 thus far.  Her mobility and activity are less than ideal.   Morbid obesity with BMI of 50.0-59.9, adult (HCC) Worsens LE swelling.  Weight loss would be of benefit.  Varicose veins of leg with swelling, bilateral Reflux study today shows extensive venous reflux in the left great saphenous vein throughout including the saphenofemoral junction.  No DVT is identified. Given her prominent swelling and marked skin changes which would be CEAP class IV on the left leg, I would recommend laser ablation of the left great saphenous vein.  She has previously been treated for DVT and that has resolved.  I have discussed the risks and benefits of the procedure and her mom is agreeable to proceed.  Deep vein thrombosis (DVT) of popliteal vein of left lower extremity (HCC) Had 3 months of anticoagulation and is now off anticoagulation appropriately without recurrent DVT    Festus Barren, MD  08/16/2021 3:28 PM    This  note was created with Dragon medical transcription system.  Any errors from dictation are purely unintentional

## 2021-08-25 ENCOUNTER — Other Ambulatory Visit: Payer: Self-pay | Admitting: Family Medicine

## 2021-08-25 ENCOUNTER — Other Ambulatory Visit: Payer: Self-pay | Admitting: Adult Health

## 2021-08-25 ENCOUNTER — Other Ambulatory Visit: Payer: Self-pay | Admitting: Podiatry

## 2021-08-26 NOTE — Telephone Encounter (Signed)
Is this okay to refill? 

## 2021-08-31 ENCOUNTER — Other Ambulatory Visit: Payer: Self-pay | Admitting: Podiatry

## 2021-08-31 ENCOUNTER — Ambulatory Visit (INDEPENDENT_AMBULATORY_CARE_PROVIDER_SITE_OTHER): Payer: Medicare PPO | Admitting: Family Medicine

## 2021-08-31 ENCOUNTER — Encounter: Payer: Self-pay | Admitting: Family Medicine

## 2021-08-31 VITALS — BP 132/80 | HR 94 | Wt 246.0 lb

## 2021-08-31 DIAGNOSIS — N939 Abnormal uterine and vaginal bleeding, unspecified: Secondary | ICD-10-CM

## 2021-08-31 NOTE — Assessment & Plan Note (Signed)
Given thickness, possible PMB--will take to OR for EUA and pap and pelvic and D & C with hysteroscopy.

## 2021-08-31 NOTE — Progress Notes (Signed)
   Subjective:    Patient ID: [name] is a 51 y.o. female presenting with No chief complaint on file.  on 08/31/2021  HPI: New pt. Today. Referred for abnormal bleeding on Eliquis for DVT earlier this year. She has stopped this medication for now. Patient underwent u/s which revealed an endometrial stripe of 1.1 mm and small fibroid. She has not had pap or pelvic.  Has previously had 8 months of no cycle. They had spaced out before this. Had bleeding for entire 3 months of Eliquis use, but has now stopped.  Review of Systems  Constitutional:  Negative for chills and fever.  Respiratory:  Negative for shortness of breath.   Cardiovascular:  Negative for chest pain.  Gastrointestinal:  Negative for abdominal pain, nausea and vomiting.  Genitourinary:  Negative for dysuria.  Skin:  Negative for rash.      Objective:    BP 132/80   Pulse 94   Wt 246 lb (111.6 kg)   BMI 51.41 kg/m  Physical Exam Exam conducted with a chaperone present.  Constitutional:      General: She is not in acute distress.    Appearance: She is well-developed. She is obese.  HENT:     Head: Normocephalic and atraumatic.  Eyes:     General: No scleral icterus. Cardiovascular:     Rate and Rhythm: Normal rate.  Pulmonary:     Effort: Pulmonary effort is normal.  Abdominal:     Palpations: Abdomen is soft.  Musculoskeletal:     Cervical back: Neck supple.  Skin:    General: Skin is warm and dry.  Neurological:     Mental Status: She is alert and oriented to person, place, and time.         Assessment & Plan:   Problem List Items Addressed This Visit       Unprioritized   Vaginal bleeding - Primary    Given thickness, possible PMB--will take to OR for EUA and pap and pelvic and D & C with hysteroscopy.         Return in about 3 months (around 12/01/2021) for postop check.  Reva Bores, MD 08/31/2021 1:14 PM

## 2021-09-02 ENCOUNTER — Telehealth (INDEPENDENT_AMBULATORY_CARE_PROVIDER_SITE_OTHER): Payer: Self-pay

## 2021-09-02 ENCOUNTER — Telehealth (INDEPENDENT_AMBULATORY_CARE_PROVIDER_SITE_OTHER): Payer: Self-pay | Admitting: Vascular Surgery

## 2021-09-02 NOTE — Telephone Encounter (Signed)
LVM for pt's mother - advising of the out of network situation with [facility]. I advised her to call us back if she has any questions.

## 2021-09-02 NOTE — Telephone Encounter (Signed)
Patient's mother called wanting to know why she hasn't been called regarding her daughter having laser procedure. Please give the mother a call back. Thank you.

## 2021-09-06 ENCOUNTER — Telehealth (INDEPENDENT_AMBULATORY_CARE_PROVIDER_SITE_OTHER): Payer: Self-pay | Admitting: Vascular Surgery

## 2021-09-06 NOTE — Telephone Encounter (Signed)
Patients mother called in wanting to know if her daughter can be put to sleep in order to have her laser because of her down syndrome.. I let patient know I would have to ask provider and a nurse would get back with her    Please call and advise

## 2021-09-07 NOTE — Telephone Encounter (Signed)
Patient mother was made aware that for the laser ablation we do not use any anesthesia for the patient to put to sleep. We will send a prescription for two xanax 0.5mg .

## 2021-09-07 NOTE — Telephone Encounter (Signed)
Pt's insurance is out of network. I called and LVM on 7.7.23 to advise pt's mother. I explained that when it was straightened out,  we would be able to get pt scheduled. I will call again to make sure she received the message. It is part of the credentialing and that department is working on resolution.

## 2021-09-09 ENCOUNTER — Telehealth: Payer: Self-pay

## 2021-09-09 NOTE — Telephone Encounter (Signed)
Called to discuss potential surgery dates, opted for 08/01.

## 2021-09-09 NOTE — Telephone Encounter (Signed)
-----   Message from Reva Bores, MD sent at 08/31/2021  1:34 PM EDT ----- Patient with Down's--please call her mother around surgery scheduling--needs EUA, pap, D & C hysteroscopy for PMB.

## 2021-09-12 NOTE — Telephone Encounter (Signed)
LVM for pt's mother TCB and schedule appt for pt - laser ablation. Will also need 1 week post laser [place] and 4 week f/u.   left leg laser. auth # [id number]. 8.1.23 - 10.30.23. see jd.

## 2021-09-23 ENCOUNTER — Telehealth: Payer: Self-pay

## 2021-09-23 NOTE — Telephone Encounter (Signed)
Patient's mother called with updated surgery date, time, and location.

## 2021-09-26 ENCOUNTER — Encounter (HOSPITAL_COMMUNITY): Payer: Self-pay | Admitting: Family Medicine

## 2021-09-26 ENCOUNTER — Other Ambulatory Visit: Payer: Self-pay

## 2021-09-26 NOTE — Pre-Procedure Instructions (Signed)
[id number] [place], [place] - [address] [place] [address] Phone: [phone] Fax: [phone]   PCP -Eustaquio Boyden, MD Cardiologist - Denies  Chest x-ray - 10/25/20 EKG - 10/21/20 ECHO - 09/27/14  CPAP - Wears every night  Aspirin Instructions: Last dose  ERAS Protcol - Clears until 1130  Anesthesia review: N  Patient verbally denies any shortness of breath, fever, cough and chest pain during phone call   -------------  SDW INSTRUCTIONS given:  Your procedure is scheduled on 09/27/21.  Report to [facility] Main Entrance "A" at 1230 P.M., and check in at the Admitting office.  Call this number if you have problems the morning of surgery:  [phone]   Remember:  Do not eat after midnight the night before your surgery  You may drink clear liquids until 1130 the morning of your surgery.   Clear liquids allowed are: Water, Non-Citrus Juices (without pulp), Carbonated Beverages, Clear Tea, Black Coffee Only, and Gatorade    Take these medicines the morning of surgery with A SIP OF WATER  levETIRAcetam (KEPPRA)  levothyroxine (SYNTHROID)  memantine (NAMENDA)  acetaminophen (TYLENOL)-if needed   As of today, STOP taking any Aspirin (unless otherwise instructed by your surgeon) Aleve, Naproxen, Ibuprofen, Motrin, Advil, Goody's, BC's, all herbal medications, fish oil, and all vitamins.                      Do not wear jewelry, make up, or nail polish            Do not wear lotions, powders, perfumes/colognes, or deodorant.            Do not shave 48 hours prior to surgery.  Men may shave face and neck.            Do not bring valuables to the hospital.            [facility] is not responsible for any belongings or valuables.  Do NOT Smoke (Tobacco/Vaping) 24 hours prior to your procedure If you use a CPAP at night, you may bring all equipment for your overnight stay.   Contacts, glasses, dentures or  bridgework may not be worn into surgery.      For patients admitted to the hospital, discharge time will be determined by your treatment team.   Patients discharged the day of surgery will not be allowed to drive home, and someone needs to stay with them for 24 hours.    Special instructions:   [place]- Preparing For Surgery  Before surgery, you can play an important role. Because skin is not sterile, your skin needs to be as free of germs as possible. You can reduce the number of germs on your skin by washing with CHG (chlorahexidine gluconate) Soap before surgery.  CHG is an antiseptic cleaner which kills germs and bonds with the skin to continue killing germs even after washing.    Oral Hygiene is also important to reduce your risk of infection.  Remember - BRUSH YOUR TEETH THE MORNING OF SURGERY WITH YOUR REGULAR TOOTHPASTE  Please do not use if you have an allergy to CHG or antibacterial soaps. If your skin becomes reddened/irritated stop using the CHG.  Do not shave (including legs and underarms) for at least 48 hours prior to first CHG shower. It is OK to shave your face.  Please follow these instructions carefully.   Shower the [employer] [facility] and  the MORNING OF SURGERY with DIAL Soap.   Pat yourself dry with a CLEAN TOWEL.  Wear CLEAN PAJAMAS to bed the night before surgery  Place CLEAN SHEETS on your bed the night of your first shower and DO NOT SLEEP WITH PETS.   Day of Surgery: Please shower morning of surgery  Wear Clean/Comfortable clothing the morning of surgery Do not apply any deodorants/lotions.   Remember to brush your teeth WITH YOUR REGULAR TOOTHPASTE.   Questions were answered. Patient verbalized understanding of instructions.

## 2021-09-27 ENCOUNTER — Encounter (HOSPITAL_COMMUNITY)
Admission: RE | Disposition: A | Discharge status: Home / Self Care | Payer: Self-pay | Source: Home / Self Care | Attending: Family Medicine

## 2021-09-27 ENCOUNTER — Other Ambulatory Visit: Payer: Self-pay

## 2021-09-27 ENCOUNTER — Ambulatory Visit (HOSPITAL_COMMUNITY)
Admission: RE | Admit: 2021-09-27 | Discharge: 2021-09-27 | Disposition: A | Discharge status: Home / Self Care | Payer: Medicare PPO | Attending: Family Medicine | Admitting: Family Medicine

## 2021-09-27 ENCOUNTER — Ambulatory Visit (HOSPITAL_BASED_OUTPATIENT_CLINIC_OR_DEPARTMENT_OTHER): Payer: Medicare PPO | Admitting: Certified Registered"

## 2021-09-27 ENCOUNTER — Ambulatory Visit (HOSPITAL_COMMUNITY): Payer: Medicare PPO | Admitting: Certified Registered"

## 2021-09-27 ENCOUNTER — Encounter (HOSPITAL_COMMUNITY): Payer: Self-pay | Admitting: Family Medicine

## 2021-09-27 DIAGNOSIS — Z7901 Long term (current) use of anticoagulants: Secondary | ICD-10-CM | POA: Insufficient documentation

## 2021-09-27 DIAGNOSIS — N950 Postmenopausal bleeding: Secondary | ICD-10-CM

## 2021-09-27 DIAGNOSIS — R9389 Abnormal findings on diagnostic imaging of other specified body structures: Secondary | ICD-10-CM | POA: Diagnosis not present

## 2021-09-27 DIAGNOSIS — F418 Other specified anxiety disorders: Secondary | ICD-10-CM | POA: Diagnosis not present

## 2021-09-27 DIAGNOSIS — E039 Hypothyroidism, unspecified: Secondary | ICD-10-CM | POA: Diagnosis not present

## 2021-09-27 DIAGNOSIS — N939 Abnormal uterine and vaginal bleeding, unspecified: Secondary | ICD-10-CM

## 2021-09-27 DIAGNOSIS — Z01419 Encounter for gynecological examination (general) (routine) without abnormal findings: Secondary | ICD-10-CM | POA: Diagnosis not present

## 2021-09-27 DIAGNOSIS — N8501 Benign endometrial hyperplasia: Secondary | ICD-10-CM | POA: Diagnosis not present

## 2021-09-27 DIAGNOSIS — Z86718 Personal history of other venous thrombosis and embolism: Secondary | ICD-10-CM | POA: Insufficient documentation

## 2021-09-27 DIAGNOSIS — N8500 Endometrial hyperplasia, unspecified: Secondary | ICD-10-CM | POA: Diagnosis not present

## 2021-09-27 DIAGNOSIS — Z124 Encounter for screening for malignant neoplasm of cervix: Secondary | ICD-10-CM

## 2021-09-27 DIAGNOSIS — G4733 Obstructive sleep apnea (adult) (pediatric): Secondary | ICD-10-CM | POA: Diagnosis not present

## 2021-09-27 DIAGNOSIS — Z9989 Dependence on other enabling machines and devices: Secondary | ICD-10-CM | POA: Diagnosis not present

## 2021-09-27 HISTORY — PX: HYSTEROSCOPY WITH D & C: SHX1775

## 2021-09-27 LAB — CBC
HCT: 40.6 % (ref 36.0–46.0)
Hemoglobin: 13.6 g/dL (ref 12.0–15.0)
MCH: 34.2 pg — ABNORMAL HIGH (ref 26.0–34.0)
MCHC: 33.5 g/dL (ref 30.0–36.0)
MCV: 102 fL — ABNORMAL HIGH (ref 80.0–100.0)
Platelets: 220 10*3/uL (ref 150–400)
RBC: 3.98 MIL/uL (ref 3.87–5.11)
RDW: 14.2 % (ref 11.5–15.5)
WBC: 4.9 10*3/uL (ref 4.0–10.5)
nRBC: 0 % (ref 0.0–0.2)

## 2021-09-27 LAB — POCT PREGNANCY, URINE: Preg Test, Ur: NEGATIVE

## 2021-09-27 SURGERY — DILATATION AND CURETTAGE /HYSTEROSCOPY
Anesthesia: General

## 2021-09-27 MED ORDER — FENTANYL CITRATE (PF) 100 MCG/2ML IJ SOLN
INTRAMUSCULAR | Status: AC
  Filled 2021-09-27: qty 2

## 2021-09-27 MED ORDER — ORAL CARE MOUTH RINSE: 15.0000 mL | Freq: Once | OROMUCOSAL | Status: AC

## 2021-09-27 MED ORDER — DEXAMETHASONE SODIUM PHOSPHATE 10 MG/ML IJ SOLN
INTRAMUSCULAR | Status: DC | PRN
  Administered 2021-09-27: 5 mg via INTRAVENOUS

## 2021-09-27 MED ORDER — LIDOCAINE 2% (20 MG/ML) 5 ML SYRINGE
INTRAMUSCULAR | Status: AC
  Filled 2021-09-27: qty 5

## 2021-09-27 MED ORDER — FENTANYL CITRATE (PF) 250 MCG/5ML IJ SOLN
INTRAMUSCULAR | Status: DC | PRN
Start: 2021-09-27 — End: 2021-09-27
  Administered 2021-09-27 (×2): 25 ug via INTRAVENOUS

## 2021-09-27 MED ORDER — PROPOFOL 10 MG/ML IV BOLUS
INTRAVENOUS | Status: AC
  Filled 2021-09-27: qty 20

## 2021-09-27 MED ORDER — GABAPENTIN 300 MG PO CAPS
300.0000 mg | ORAL_CAPSULE | ORAL | Status: AC
  Administered 2021-09-27: 300 mg via ORAL
  Filled 2021-09-27: qty 1

## 2021-09-27 MED ORDER — DEXAMETHASONE SODIUM PHOSPHATE 10 MG/ML IJ SOLN
INTRAMUSCULAR | Status: AC
  Filled 2021-09-27: qty 1

## 2021-09-27 MED ORDER — LIDOCAINE 2% (20 MG/ML) 5 ML SYRINGE
INTRAMUSCULAR | Status: DC | PRN
  Administered 2021-09-27: 60 mg via INTRAVENOUS
  Administered 2021-09-27: 40 mg via INTRAVENOUS

## 2021-09-27 MED ORDER — OXYCODONE HCL 5 MG PO TABS
5.0000 mg | ORAL_TABLET | Freq: Once | ORAL | Status: AC | PRN
  Administered 2021-09-27: 5 mg via ORAL

## 2021-09-27 MED ORDER — FENTANYL CITRATE (PF) 100 MCG/2ML IJ SOLN
25.0000 ug | INTRAMUSCULAR | Status: DC | PRN
  Administered 2021-09-27 (×2): 25 ug via INTRAVENOUS

## 2021-09-27 MED ORDER — OXYCODONE HCL 5 MG/5ML PO SOLN: 5.0000 mg | Freq: Once | ORAL | Status: AC | PRN

## 2021-09-27 MED ORDER — ACETAMINOPHEN 500 MG PO TABS
1000.0000 mg | ORAL_TABLET | ORAL | Status: AC
  Administered 2021-09-27: 1000 mg via ORAL
  Filled 2021-09-27: qty 2

## 2021-09-27 MED ORDER — POVIDONE-IODINE 10 % EX SWAB
2.0000 | Freq: Once | CUTANEOUS | Status: AC
Start: 2021-09-27 — End: 2021-09-27
  Administered 2021-09-27: 2 via TOPICAL

## 2021-09-27 MED ORDER — ONDANSETRON HCL 4 MG/2ML IJ SOLN
INTRAMUSCULAR | Status: DC | PRN
  Administered 2021-09-27: 4 mg via INTRAVENOUS

## 2021-09-27 MED ORDER — PROPOFOL 10 MG/ML IV BOLUS
INTRAVENOUS | Status: DC | PRN
  Administered 2021-09-27: 150 mg via INTRAVENOUS
  Administered 2021-09-27: 30 mg via INTRAVENOUS
  Administered 2021-09-27: 50 mg via INTRAVENOUS

## 2021-09-27 MED ORDER — LACTATED RINGERS IV SOLN: INTRAVENOUS | Status: DC

## 2021-09-27 MED ORDER — CHLORHEXIDINE GLUCONATE 0.12 % MT SOLN
15.0000 mL | Freq: Once | OROMUCOSAL | Status: AC
  Administered 2021-09-27: 15 mL via OROMUCOSAL
  Filled 2021-09-27: qty 15

## 2021-09-27 MED ORDER — FENTANYL CITRATE (PF) 250 MCG/5ML IJ SOLN
INTRAMUSCULAR | Status: AC
  Filled 2021-09-27: qty 5

## 2021-09-27 MED ORDER — DEXMEDETOMIDINE HCL IN NACL 80 MCG/20ML IV SOLN
INTRAVENOUS | Status: AC
  Filled 2021-09-27: qty 20

## 2021-09-27 MED ORDER — ONDANSETRON HCL 4 MG/2ML IJ SOLN
INTRAMUSCULAR | Status: AC
  Filled 2021-09-27: qty 2

## 2021-09-27 MED ORDER — SODIUM CHLORIDE 0.9 % IR SOLN
Status: DC | PRN
  Administered 2021-09-27: 1

## 2021-09-27 MED ORDER — MIDAZOLAM HCL 2 MG/2ML IJ SOLN
INTRAMUSCULAR | Status: AC
  Filled 2021-09-27: qty 2

## 2021-09-27 MED ORDER — GLYCOPYRROLATE PF 0.2 MG/ML IJ SOSY
PREFILLED_SYRINGE | INTRAMUSCULAR | Status: AC
  Filled 2021-09-27: qty 1

## 2021-09-27 MED ORDER — EPHEDRINE SULFATE-NACL 50-0.9 MG/10ML-% IV SOSY
PREFILLED_SYRINGE | INTRAVENOUS | Status: DC | PRN
  Administered 2021-09-27 (×2): 5 mg via INTRAVENOUS

## 2021-09-27 MED ORDER — LACTATED RINGERS IV SOLN
INTRAVENOUS | Status: DC
Start: 2021-09-27 — End: 2021-09-27

## 2021-09-27 MED ORDER — OXYCODONE HCL 5 MG PO TABS
ORAL_TABLET | ORAL | Status: AC
  Filled 2021-09-27: qty 1

## 2021-09-27 MED ORDER — BUPIVACAINE HCL (PF) 0.25 % IJ SOLN
INTRAMUSCULAR | Status: AC
  Filled 2021-09-27: qty 30

## 2021-09-27 MED ORDER — ONDANSETRON HCL 4 MG/2ML IJ SOLN: 4.0000 mg | Freq: Once | INTRAMUSCULAR | Status: DC | PRN

## 2021-09-27 MED ORDER — MIDAZOLAM HCL 2 MG/2ML IJ SOLN
INTRAMUSCULAR | Status: DC | PRN
  Administered 2021-09-27: 2 mg via INTRAVENOUS

## 2021-09-27 SURGICAL SUPPLY — 11 items
CATH ROBINSON RED A/P 16FR (CATHETERS) ×2 IMPLANT
GLOVE BIO SURGEON STRL SZ7 (GLOVE) ×2 IMPLANT
GLOVE BIOGEL PI IND STRL 7.0 (GLOVE) ×2 IMPLANT
GLOVE BIOGEL PI INDICATOR 7.0 (GLOVE) ×2
GOWN STRL REUS W/ TWL LRG LVL3 (GOWN DISPOSABLE) ×2 IMPLANT
GOWN STRL REUS W/TWL LRG LVL3 (GOWN DISPOSABLE) ×6
KIT PROCEDURE FLUENT (KITS) ×2 IMPLANT
PACK VAGINAL MINOR WOMEN LF (CUSTOM PROCEDURE TRAY) ×2 IMPLANT
PAD OB MATERNITY 4.3X12.25 (PERSONAL CARE ITEMS) ×2 IMPLANT
SEAL ROD LENS SCOPE MYOSURE (ABLATOR) ×1 IMPLANT
TOWEL GREEN STERILE FF (TOWEL DISPOSABLE) ×3 IMPLANT

## 2021-09-27 NOTE — Transfer of Care (Signed)
Immediate Anesthesia Transfer of Care Note  Patient: [name]  Procedure(s) Performed: DILATATION AND CURETTAGE /HYSTEROSCOPY EXAM UNDER ANESTHESIA AND PAP SMEAR  Patient Location: PACU  Anesthesia Type:General  Level of Consciousness: drowsy, patient cooperative and responds to stimulation  Airway & Oxygen Therapy: Patient Spontanous Breathing and Patient connected to face mask oxygen  Post-op Assessment: Report given to RN, Post -op Vital signs reviewed and stable and Patient moving all extremities X 4  Post vital signs: Reviewed and stable  Last Vitals:  Vitals Value Taken Time  BP 126/87 09/27/21 1555  Temp    Pulse 84 09/27/21 1556  Resp 15 09/27/21 1556  SpO2 94 % 09/27/21 1556  Vitals shown include unvalidated device data.  Last Pain:  Vitals:   09/27/21 1253  TempSrc: Oral         Complications: No notable events documented.

## 2021-09-27 NOTE — H&P (Signed)
[name] is an 51 y.o. No obstetric history on file. female.   Chief Complaint: abnormal bleeding HPI: Abnormal bleeding on Eliquis for DVT earlier this year. She has stopped this medication for now. Patient underwent u/s which revealed an endometrial stripe of 1.1 mm and small fibroid. She has not had pap or pelvic.  Has previously had 8 months of no cycle. They had spaced out before this. Had bleeding for entire 3 months of Eliquis use, but has now stopped.  Past Medical History:  Diagnosis Date   Dementia due to another medical condition (HCC)    Trisomy 21-related, sees Sethi yearly, nl CT head 2010, did not tolerate aricept   Depression    with OCD   DJD (degenerative joint disease) of knee    bilat knee arthritis, had 3/3 Supartz injections [name])   Down syndrome    Dry ARMD 02/2013   Eczema    Empyema of left pleural space (HCC) 09/24/2011   Hypothyroid    OCD (obsessive compulsive disorder)    OSA on CPAP 05/2012   severe OSA with AHI 118, desat nadir to 67%   Pneumonia    multiple pneumonias   Presenile dementia, uncomplicated (HCC)    Seizure (HCC) 2013   x 2 ; "very mild"; during hospitalization "while coming off vent"    Past Surgical History:  Procedure Laterality Date   ACHILLES TENDON REPAIR Right 01/2017   [facility]   ANTERIOR CRUCIATE LIGAMENT REPAIR Left    FLEXIBLE BRONCHOSCOPY  09/24/2011   Procedure: FLEXIBLE BRONCHOSCOPY;  Surgeon: Purcell Nails, MD;  Location: [facility] OR;  Service: Thoracic;  Laterality: N/A;   LUMBAR DISC SURGERY     ruptured disc   TRACHEOSTOMY TUBE PLACEMENT  10/11/2011   Procedure: TRACHEOSTOMY;  Surgeon: Serena Colonel, MD;  Location: [facility] OR;  Service: ENT;  Laterality: N/A;   VIDEO ASSISTED THORACOSCOPY (VATS) /THOROCOTOMY/RADIOACTIVE SEED IMPLANT Left 09/24/2011   VATS, (Dr. Barry Dienes)    Family History  Problem Relation Age of Onset   Arthritis Mother    Hypertension Father    Diabetes Father    Cancer  Neg Hx    Coronary artery disease Neg Hx    Stroke Neg Hx    Social History:  reports that she has never smoked. She has never used smokeless tobacco. She reports that she does not drink alcohol and does not use drugs.  Allergies: No Known Allergies  Medications Prior to Admission  Medication Sig Dispense Refill   acetaminophen (TYLENOL) 650 MG CR tablet Take 1,300 mg by mouth every 8 (eight) hours as needed for pain.     allopurinol (ZYLOPRIM) 100 MG tablet Take 1 tablet (100 mg total) by mouth daily. 90 tablet 3   aspirin EC 81 MG tablet Take 1 tablet (81 mg total) by mouth daily. Swallow whole.     clobetasol cream (TEMOVATE) 0.05 % APPLY TOPICALLY 2 (TWO) TIMES DAILY AS NEEDED (SKIN RASH). DON'T USE MORE THAN 2 WEEKS AT A TIME, AVOID FACE, UNDERARMS, GROIN. 45 g 0   ibuprofen (ADVIL) 200 MG tablet Take 600 mg by mouth every 8 (eight) hours as needed for moderate pain.     levETIRAcetam (KEPPRA) 500 MG tablet Take 1 tablet (500 mg total) by mouth 2 (two) times daily. 180 tablet 3   levothyroxine (SYNTHROID) 125 MCG tablet TAKE 1 TABLET BY MOUTH EVERY DAY BEFORE BREAKFAST 90 tablet 0   memantine (NAMENDA) 10 MG tablet Take 1 tablet (  10 mg total) by mouth 2 (two) times daily. 180 tablet 3   Multiple Vitamins-Minerals (WOMENS MULTI PO) Take 1 tablet by mouth daily.      sertraline (ZOLOFT) 50 MG tablet TAKE 1 TABLET BY MOUTH EVERYDAY AT BEDTIME 90 tablet 3   silver sulfADIAZINE (SILVADENE) 1 % cream Apply 1 application. topically daily. (Patient not taking: Reported on 08/31/2021) 50 g 0    A comprehensive review of systems was negative.  Blood pressure 124/65, pulse 70, temperature 98.6 F (37 C), temperature source Oral, resp. rate 20, height 4\' 11"  (1.499 m), weight 104.3 kg, SpO2 95 %. BP 124/65   Pulse 70   Temp 98.6 F (37 C) (Oral)   Resp 20   Ht 4\' 11"  (1.499 m)   Wt 104.3 kg   SpO2 95%   BMI 46.45 kg/m  General appearance: alert, cooperative, appears stated age, and  moderately obese Lungs:  normal effort Heart: regular rate and rhythm Abdomen: soft, non-tender; bowel sounds normal; no masses,  no organomegaly Extremities: Homans sign is negative, no sign of DVT Skin: Skin color, texture, turgor normal. No rashes or lesions   Results for orders placed or performed during the hospital encounter of 09/27/21 (from the past 24 hour(s))  Pregnancy, urine POC     Status: None   Collection Time: 09/27/21 12:37 PM  Result Value Ref Range   Preg Test, Ur NEGATIVE NEGATIVE  CBC     Status: Abnormal   Collection Time: 09/27/21  1:06 PM  Result Value Ref Range   WBC 4.9 4.0 - 10.5 K/uL   RBC 3.98 3.87 - 5.11 MIL/uL   Hemoglobin 13.6 12.0 - 15.0 g/dL   HCT 11/27/21 11/27/21 - 40.8 %   MCV 102.0 (H) 80.0 - 100.0 fL   MCH 34.2 (H) 26.0 - 34.0 pg   MCHC 33.5 30.0 - 36.0 g/dL   RDW 14.4 81.8 - 56.3 %   Platelets 220 150 - 400 K/uL   nRBC 0.0 0.0 - 0.2 %   Pelvic u/s 05/31/2021 1. Endometrium measures 11 mm in thickness. In the setting of post-menopausal bleeding, endometrial sampling is indicated to exclude carcinoma. If results are benign, sonohysterogram should be considered for focal lesion work-up. (Ref: Radiological Reasoning: Algorithmic Workup of Abnormal Vaginal Bleeding with Endovaginal Sonography and Sonohysterography. AJR 2008; 07/31/2021) 2. Likely small calcification in the left ovary. 3. Intramural uterine fibroid.  Assessment/Plan Active Problems:   Vaginal bleeding   Screening for cervical cancer  For D & C with hysteroscopy and possible Myosure and EUA with pap smear. Risks include but are not limited to bleeding, infection, injury to surrounding structures, including bowel, bladder and ureters, blood clots, and death.  Likelihood of success is high.    2009 09/27/2021, 2:12 PM

## 2021-09-27 NOTE — Op Note (Signed)
Preoperative diagnoses:  abnormal bleeding on eliquis  Postoperative diagnosis: Same  Procedure: Pap smear, D & C, attempt at hysteroscopy  Surgeon: Shelbie Proctor. Shawnie Pons, MD  Anesthesia: Shelton Silvas, MD  Findings: Normal cervix  Fluid deficit: 110 cc  Estimated blood loss: Minimum  Pathology: Endometrial curettings  Indications for procedure: 51 y.o. with history of DVT and bleeding on Eliquis who presents for bleeding the entire time on Eliquis. U/s shows thickened endometrium at 1.1 cm. Bleeding stopped after she stopped her Eliquis.  Procedure: The patient was taken to the operating room where spinal analgesia was inserted. SCDs were in place.  Time out was performed. Patient was placed in dorsolithotomy in [place] stirrups. She was prepped and draped in the usual sterile fashion. A Red Rubber catheter was used to drain her bladder. A Peterson speculum was placeed in the vagina. The cervix was visualized anteriorly and grasped with a single-tooth tenaculum. There was some difficulty in opening the speculum and the tenaculum kept pulling through the cervix. Sequential dilation was performed with Shawnie Pons dilators. The hysteroscope was attempted to be inserted, but never was with confidence. The hysteroscope was removed. Sharp curettage was performed in all 4 quadrants. All instruments were removed from the vagina. All instrument, needle and lap counts were correct x2. The patient was awakened and is recovering in stable condition.  Reva Bores MD 09/27/2021 3:47 PM

## 2021-09-27 NOTE — Anesthesia Procedure Notes (Signed)
Procedure Name: LMA Insertion Date/Time: 09/27/2021 3:00 PM  Performed by: Aundria Rud, CRNAPre-anesthesia Checklist: Patient identified, Emergency Drugs available, Suction available and Patient being monitored Patient Re-evaluated:Patient Re-evaluated prior to induction Oxygen Delivery Method: Circle System Utilized Preoxygenation: Pre-oxygenation with 100% oxygen Induction Type: IV induction and Inhalational induction Ventilation: Two handed mask ventilation required LMA: LMA inserted LMA Size: 4.0 Number of attempts: 1 Airway Equipment and Method: Bite block Placement Confirmation: positive ETCO2 and CO2 detector Tube secured with: Tape Dental Injury: Teeth and Oropharynx as per pre-operative assessment

## 2021-09-27 NOTE — Anesthesia Preprocedure Evaluation (Addendum)
Anesthesia Evaluation  Patient identified by MRN, date of birth, ID band Patient awake    Reviewed: Allergy & Precautions, NPO status , Patient's Chart, lab work & pertinent test results  Airway Mallampati: I  TM Distance: >3 FB Neck ROM: Limited  Mouth opening: Limited Mouth Opening  Dental  (+) Teeth Intact, Dental Advisory Given   Pulmonary sleep apnea and Continuous Positive Airway Pressure Ventilation , pneumonia, resolved,  Hx/o tracheostomy 2013 Hx/o empyema 2013 S/P VATS   Pulmonary exam normal breath sounds clear to auscultation       Cardiovascular negative cardio ROS Normal cardiovascular exam Rhythm:Regular Rate:Normal     Neuro/Psych Seizures -, Well Controlled,  PSYCHIATRIC DISORDERS Anxiety Depression Dementia [name] OCD Neuromuscular disease    GI/Hepatic negative GI ROS, Neg liver ROS,   Endo/Other  Hypothyroidism Morbid obesity  Renal/GU negative Renal ROS  negative genitourinary   Musculoskeletal  (+) Arthritis , Osteoarthritis,  Eczema   Abdominal (+) + obese,   Peds  Hematology   Anesthesia Other Findings   Reproductive/Obstetrics PMB Thickened endometrium                            Anesthesia Physical Anesthesia Plan  ASA: 3  Anesthesia Plan: General   Post-op Pain Management: Minimal or no pain anticipated   Induction: Intravenous  PONV Risk Score and Plan: 4 or greater and Treatment may vary due to age or medical condition, Midazolam, Ondansetron and Dexamethasone  Airway Management Planned: LMA  Additional Equipment: None  Intra-op Plan:   Post-operative Plan: Extubation in OR  Informed Consent: I have reviewed the patients History and Physical, chart, labs and discussed the procedure including the risks, benefits and alternatives for the proposed anesthesia with the patient or authorized representative who has indicated his/her understanding  and acceptance.     Dental advisory given  Plan Discussed with: Anesthesiologist and CRNA  Anesthesia Plan Comments:         Anesthesia Quick Evaluation

## 2021-09-27 NOTE — Anesthesia Postprocedure Evaluation (Signed)
Anesthesia Post Note  Patient: [name]  Procedure(s) Performed: DILATATION AND CURETTAGE /HYSTEROSCOPY EXAM UNDER ANESTHESIA AND PAP SMEAR     Patient location during evaluation: PACU Anesthesia Type: General Level of consciousness: awake and alert Pain management: pain level controlled Vital Signs Assessment: post-procedure vital signs reviewed and stable Respiratory status: spontaneous breathing, nonlabored ventilation, respiratory function stable and patient connected to nasal cannula oxygen Cardiovascular status: blood pressure returned to baseline and stable Postop Assessment: no apparent nausea or vomiting Anesthetic complications: no   No notable events documented.  Last Vitals:  Vitals:   09/27/21 1625 09/27/21 1635  BP: 110/66 116/72  Pulse: 85 86  Resp: 16 17  Temp:  36.6 C  SpO2: 95% 96%    Last Pain:  Vitals:   09/27/21 1253  TempSrc: Oral                 Shelton Silvas

## 2021-09-28 ENCOUNTER — Encounter (HOSPITAL_COMMUNITY): Payer: Self-pay | Admitting: Family Medicine

## 2021-09-29 LAB — CYTOLOGY - PAP
Adequacy: ABSENT
Comment: NEGATIVE
Diagnosis: NEGATIVE
High risk HPV: NEGATIVE

## 2021-09-30 ENCOUNTER — Ambulatory Visit (INDEPENDENT_AMBULATORY_CARE_PROVIDER_SITE_OTHER): Payer: Medicare PPO | Admitting: Nurse Practitioner

## 2021-09-30 LAB — SURGICAL PATHOLOGY

## 2021-10-06 ENCOUNTER — Telehealth (INDEPENDENT_AMBULATORY_CARE_PROVIDER_SITE_OTHER): Payer: Self-pay | Admitting: Vascular Surgery

## 2021-10-06 DIAGNOSIS — H6123 Impacted cerumen, bilateral: Secondary | ICD-10-CM | POA: Diagnosis not present

## 2021-10-06 NOTE — Telephone Encounter (Signed)
Pt mom (poer of attorney) states that she needs another RX for compression socks for pt. She went to [employer] and bought 2 pairs but they are too small so she is going to [place] to be fitted for new pair of compression socks. Please advise - I need to mail today so they can receive and go to [place] to be fitted.

## 2021-10-06 NOTE — Telephone Encounter (Signed)
Ok, just remind me when I come back in office and I'll get it filled out for you.

## 2021-10-20 ENCOUNTER — Ambulatory Visit (INDEPENDENT_AMBULATORY_CARE_PROVIDER_SITE_OTHER): Payer: Medicare PPO

## 2021-10-20 VITALS — Wt 230.0 lb

## 2021-10-20 DIAGNOSIS — Z0000 Encounter for general adult medical examination without abnormal findings: Secondary | ICD-10-CM

## 2021-10-20 DIAGNOSIS — Z1231 Encounter for screening mammogram for malignant neoplasm of breast: Secondary | ICD-10-CM | POA: Diagnosis not present

## 2021-10-20 NOTE — Progress Notes (Signed)
Virtual Visit via Telephone Note  I connected with  [name] on 10/20/21 at 11:45 AM EDT by telephone and verified that I am speaking with the correct person using two identifiers.Spoke w/ mother [name]  Location: Patient: home Provider: [facility] Persons participating in the virtual visit: patient/Nurse Health Advisor   I discussed the limitations, risks, security and privacy concerns of performing an evaluation and management service by telephone and the availability of in person appointments. The patient expressed understanding and agreed to proceed.  Interactive audio and video telecommunications were attempted between this nurse and patient, however failed, due to patient having technical difficulties OR patient did not have access to video capability.  We continued and completed visit with audio only.  Some vital signs may be absent or patient reported.   Hal Hope, LPN  Subjective:   [name] is a 51 y.o. female who presents for Medicare Annual (Subsequent) preventive examination.  Review of Systems           Objective:    There were no vitals filed for this visit. There is no height or weight on file to calculate BMI.     09/27/2021   12:48 PM 06/07/2021    7:03 PM 07/31/2018   10:25 AM 08/08/2017    8:18 AM 09/25/2014   10:17 PM 09/25/2014    3:00 PM 01/24/2014    7:13 PM  Advanced Directives  Does Patient Have a Medical Advance Directive? Yes No No No No No No  Type of Advance Directive Healthcare Power of Attorney        Does patient want to make changes to medical advance directive? No - Guardian declined        Copy of Healthcare Power of Attorney in Chart? No - copy requested        Would patient like information on creating a medical advance directive?  No - Patient declined No - Patient declined No - Patient declined No - patient declined information      Current Medications (verified) Outpatient Encounter Medications as of 10/20/2021   Medication Sig   acetaminophen (TYLENOL) 650 MG CR tablet Take 1,300 mg by mouth every 8 (eight) hours as needed for pain.   allopurinol (ZYLOPRIM) 100 MG tablet Take 1 tablet (100 mg total) by mouth daily.   aspirin EC 81 MG tablet Take 1 tablet (81 mg total) by mouth daily. Swallow whole.   clobetasol cream (TEMOVATE) 0.05 % APPLY TOPICALLY 2 (TWO) TIMES DAILY AS NEEDED (SKIN RASH). DON'T USE MORE THAN 2 WEEKS AT A TIME, AVOID FACE, UNDERARMS, GROIN.   ibuprofen (ADVIL) 200 MG tablet Take 600 mg by mouth every 8 (eight) hours as needed for moderate pain.   levETIRAcetam (KEPPRA) 500 MG tablet Take 1 tablet (500 mg total) by mouth 2 (two) times daily.   levothyroxine (SYNTHROID) 125 MCG tablet TAKE 1 TABLET BY MOUTH EVERY DAY BEFORE BREAKFAST   memantine (NAMENDA) 10 MG tablet Take 1 tablet (10 mg total) by mouth 2 (two) times daily.   Multiple Vitamins-Minerals (WOMENS MULTI PO) Take 1 tablet by mouth daily.    sertraline (ZOLOFT) 50 MG tablet TAKE 1 TABLET BY MOUTH EVERYDAY AT BEDTIME   silver sulfADIAZINE (SILVADENE) 1 % cream Apply 1 application. topically daily. (Patient not taking: Reported on 08/31/2021)   No facility-administered encounter medications on file as of 10/20/2021.    Allergies (verified) Patient has no known allergies.   History: Past Medical History:  Diagnosis Date   Dementia due to another medical condition (HCC)    Trisomy 21-related, sees Sethi yearly, nl CT head 2010, did not tolerate aricept   Depression    with OCD   DJD (degenerative joint disease) of knee    bilat knee arthritis, had 3/3 Supartz injections [name](Kramer)   Down syndrome    Dry ARMD 02/2013   Eczema    Empyema of left pleural space (HCC) 09/24/2011   Hypothyroid    OCD (obsessive compulsive disorder)    OSA on CPAP 05/2012   severe OSA with AHI 118, desat nadir to 67%   Pneumonia    multiple pneumonias   Presenile dementia, uncomplicated (HCC)    Seizure (HCC) 2013   x 2 ; "very mild";  during hospitalization "while coming off vent"   Past Surgical History:  Procedure Laterality Date   ACHILLES TENDON REPAIR Right 01/2017   Dr. Nena PolioEvans/[facility]   ANTERIOR CRUCIATE LIGAMENT REPAIR Left    FLEXIBLE BRONCHOSCOPY  09/24/2011   Procedure: FLEXIBLE BRONCHOSCOPY;  Surgeon: Purcell Nailslarence H Owen, MD;  [facility];  Service: Thoracic;  Laterality: N/A;   HYSTEROSCOPY WITH D & C N/A 09/27/2021   Procedure: DILATATION AND CURETTAGE /HYSTEROSCOPY;  Surgeon: Reva BoresPratt, Tanya S, MD;  Location: [facility] OR;  Service: Gynecology;  Laterality: N/A;   LUMBAR DISC SURGERY     ruptured disc   TRACHEOSTOMY TUBE PLACEMENT  10/11/2011   Procedure: TRACHEOSTOMY;  Surgeon: Serena ColonelJefry Rosen, MD;  [facility];  Service: ENT;  Laterality: N/A;   VIDEO ASSISTED THORACOSCOPY (VATS) /THOROCOTOMY/RADIOACTIVE SEED IMPLANT Left 09/24/2011   VATS, (Dr. Barry Dieneswens)   Family History  Problem Relation Age of Onset   Arthritis Mother    Hypertension Father    Diabetes Father    Cancer Neg Hx    Coronary artery disease Neg Hx    Stroke Neg Hx    Social History   Socioeconomic History   Marital status: Single    Spouse name: Not on file   Number of children: 0   Years of education: Not on file   Highest education level: Not on file  Occupational History   Not on file  Tobacco Use   Smoking status: Never   Smokeless tobacco: Never  Vaping Use   Vaping Use: Never used  Substance and Sexual Activity   Alcohol use: No   Drug use: No   Sexual activity: Never  Other Topics Concern   Not on file  Social History Narrative   Lives with mom and dad, dogs   Stays at daycare during the day   Activity: walking some   Diet: good water, fruits/vegetables daily   Social Determinants of Health   Financial Resource Strain: Low Risk  (10/20/2021)   Overall Financial Resource Strain (CARDIA)    Difficulty of Paying Living Expenses: Not hard at all  Food Insecurity: No Food Insecurity (10/20/2021)   Hunger Vital  Sign    Worried About Running Out of Food in the Last Year: Never true    Ran Out of Food in the Last Year: Never true  Transportation Needs: No Transportation Needs (10/20/2021)   PRAPARE - Administrator, Civil ServiceTransportation    Lack of Transportation (Medical): No    Lack of Transportation (Non-Medical): No  Physical Activity: Inactive (10/20/2021)   Exercise Vital Sign    Days of Exercise per Week: 0 days    Minutes of Exercise per Session: 0 min  Stress: No Stress Concern Present (10/20/2021)  [employer] of Occupational Health - Occupational Stress Questionnaire    Feeling of Stress : Only a little  Social Connections: Socially Isolated (10/20/2021)   Social Connection and Isolation Panel [NHANES]    Frequency of Communication with Friends and Family: Twice a week    Frequency of Social Gatherings with Friends and Family: Three times a week    Attends Religious Services: Never    Active Member of Clubs or Organizations: No    Attends Engineer, structural: Never    Marital Status: Never married    Tobacco Counseling Counseling given: Not Answered   Clinical Intake:  Pre-visit preparation completed: Yes  Pain : No/denies pain     Nutritional Risks: None Diabetes: No  How often do you need to have someone help you when you read instructions, pamphlets, or other written materials from your doctor or pharmacy?: 5 - Always  Diabetic?no  Interpreter Needed?: No  Information entered by :: Kennedy Bucker, LPN   Activities of Daily Living    09/27/2021    1:07 PM 09/27/2021   12:48 PM  In your present state of health, do you have any difficulty performing the following activities:  Hearing? 0   Vision? 0   Difficulty concentrating or making decisions? 1   Walking or climbing stairs? 1   Dressing or bathing? 1   Doing errands, shopping?  0    Patient Care Team: Eustaquio Boyden, MD as PCP - General (Family Medicine) Arville Go, MD as Referring Physician (Internal  Medicine)  Indicate any recent Medical Services you may have received from other than [facility] providers in the past year (date may be approximate).     Assessment:   This is a routine wellness examination for [name].  Hearing/Vision screen No results found.  Dietary issues and exercise activities discussed:     Goals Addressed             This Visit's Progress    DIET - EAT MORE FRUITS AND VEGETABLES         Depression Screen    10/20/2021   11:38 AM 10/18/2020    2:18 PM 09/24/2019    2:02 PM 08/08/2017    8:15 AM 09/08/2016    3:16 PM 09/27/2015   10:29 AM 07/13/2014   11:19 AM  PHQ 2/9 Scores  PHQ - 2 Score 0 0 0 0 0 0 0  PHQ- 9 Score 0 0  0       Fall Risk    10/18/2020    2:18 PM 07/31/2018   10:25 AM 08/08/2017    8:15 AM 09/08/2016    3:20 PM 09/08/2016    3:16 PM  Fall Risk   Falls in the past year? 0 0 No No Yes  Number falls in past yr: 0      Injury with Fall? 0        FALL RISK PREVENTION PERTAINING TO THE HOME:  Any stairs in or around the home? Yes  If so, are there any without handrails? No  Home free of loose throw rugs in walkways, pet beds, electrical cords, etc? Yes  Adequate lighting in your home to reduce risk of falls? Yes   ASSISTIVE DEVICES UTILIZED TO PREVENT FALLS:  Life alert? No  Use of a cane, walker or w/c? Yes  Grab bars in the bathroom? No  Shower chair or bench in shower? Yes  Elevated toilet seat or a handicapped toilet? No    Cognitive  Function: unable to test PT is A & O        Immunizations Immunization History  Administered Date(s) Administered   Influenza Split 12/05/2011   Influenza, Seasonal, Injecte, Preservative Fre 02/13/2013   Influenza,inj,Quad PF,6+ Mos 12/04/2013, 11/19/2014, 11/17/2015, 12/14/2016, 02/02/2018, 10/28/2018, 11/30/2020   PFIZER(Purple Top)SARS-COV-2 Vaccination 05/29/2019, 06/19/2019   Pfizer Covid-19 Vaccine Bivalent Booster 79yrs & up 11/30/2020   Pneumococcal Polysaccharide-23  12/05/2011   Tdap 02/13/2013    TDAP status: Up to date  Flu Vaccine status: Up to date  Pneumococcal vaccine status: Due, Education has been provided regarding the importance of this vaccine. Advised may receive this vaccine at local pharmacy or Health Dept. Aware to provide a copy of the vaccination record if obtained from local pharmacy or Health Dept. Verbalized acceptance and understanding.  Covid-19 vaccine status: Completed vaccines  Qualifies for Shingles Vaccine? No   Zostavax completed No   Shingrix Completed?: No.    Education has been provided regarding the importance of this vaccine. Patient has been advised to call insurance company to determine out of pocket expense if they have not yet received this vaccine. Advised may also receive vaccine at local pharmacy or Health Dept. Verbalized acceptance and understanding.  Screening Tests Health Maintenance  Topic Date Due   HIV Screening  Never done   COLONOSCOPY (Pts 45-61yrs Insurance coverage will need to be confirmed)  Never done   MAMMOGRAM  08/12/2020   Zoster Vaccines- Shingrix (1 of 2) Never done   INFLUENZA VACCINE  09/27/2021   TETANUS/TDAP  02/14/2023   PAP SMEAR-Modifier  09/27/2024   COVID-19 Vaccine  Completed   Hepatitis C Screening  Completed   HPV VACCINES  Aged Out    Health Maintenance  Health Maintenance Due  Topic Date Due   HIV Screening  Never done   COLONOSCOPY (Pts 45-82yrs Insurance coverage will need to be confirmed)  Never done   MAMMOGRAM  08/12/2020   Zoster Vaccines- Shingrix (1 of 2) Never done   INFLUENZA VACCINE  09/27/2021    Colonscopy - declined referral  Mammogram status: Ordered today. Pt provided with contact info and advised to call to schedule appt.     Lung Cancer Screening: (Low Dose CT Chest recommended if Age 53-80 years, 30 pack-year currently smoking OR have quit w/in 15years.) does not qualify.     Additional Screening:  Hepatitis C Screening: does  qualify; Completed 10/03/11  Vision Screening: Recommended annual ophthalmology exams for early detection of glaucoma and other disorders of the eye. Is the patient up to date with their annual eye exam?  Yes  Who is the provider or what is the name of the office in which the patient attends annual eye exams? Dr.Groat If pt is not established with a provider, would they like to be referred to a provider to establish care? No .   Dental Screening: Recommended annual dental exams for proper oral hygiene  Community Resource Referral / Chronic Care Management: CRR required this visit?  No   CCM required this visit?  No      Plan:     I have personally reviewed and noted the following in the patient's chart:   Medical and social history Use of alcohol, tobacco or illicit drugs  Current medications and supplements including opioid prescriptions. Patient is not currently taking opioid prescriptions. Functional ability and status Nutritional status Physical activity Advanced directives List of other physicians Hospitalizations, surgeries, and ER visits in previous 12 months Vitals Screenings  to include cognitive, depression, and falls Referrals and appointments  In addition, I have reviewed and discussed with patient certain preventive protocols, quality metrics, and best practice recommendations. A written personalized care plan for preventive services as well as general preventive health recommendations were provided to patient.     Hal Hope, LPN   7/67/2094   Nurse Notes: none

## 2021-10-20 NOTE — Patient Instructions (Signed)
[name] , Thank you for taking time to come for your Medicare Wellness Visit. I appreciate your ongoing commitment to your health goals. Please review the following plan we discussed and let me know if I can assist you in the future.   Screening recommendations/referrals: Colonoscopy: declined referral Mammogram: referral sent Bone Density: too young Recommended yearly ophthalmology/optometry visit for glaucoma screening and checkup Recommended yearly dental visit for hygiene and checkup  Vaccinations: Influenza vaccine: 11/30/20 Pneumococcal vaccine: 12/05/11, needs 2nd shot Tdap vaccine: 02/13/13 Shingles vaccine: n/d  Covid-19: 05/29/19, 06/19/19, 11/30/20  Advanced directives: no  Conditions/risks identified: none  Next appointment: Follow up in one year for your annual wellness visit. 10/23/22 @ 11:30 am by phone  Preventive Care 40-64 Years, Female Preventive care refers to lifestyle choices and visits with your health care provider that can promote health and wellness. What does preventive care include? A yearly physical exam. This is also called an annual well check. Dental exams once or twice a year. Routine eye exams. Ask your health care provider how often you should have your eyes checked. Personal lifestyle choices, including: Daily care of your teeth and gums. Regular physical activity. Eating a healthy diet. Avoiding tobacco and drug use. Limiting alcohol use. Practicing safe sex. Taking low-dose aspirin daily starting at age 89. Taking vitamin and mineral supplements as recommended by your health care provider. What happens during an annual well check? The services and screenings done by your health care provider during your annual well check will depend on your age, overall health, lifestyle risk factors, and family history of disease. Counseling  Your health care provider may ask you questions about your: Alcohol use. Tobacco use. Drug use. Emotional  well-being. Home and relationship well-being. Sexual activity. Eating habits. Work and work Statistician. Method of birth control. Menstrual cycle. Pregnancy history. Screening  You may have the following tests or measurements: Height, weight, and BMI. Blood pressure. Lipid and cholesterol levels. These may be checked every 5 years, or more frequently if you are over 2 years old. Skin check. Lung cancer screening. You may have this screening every year starting at age 38 if you have a 30-pack-year history of smoking and currently smoke or have quit within the past 15 years. Fecal occult blood test (FOBT) of the stool. You may have this test every year starting at age 11. Flexible sigmoidoscopy or colonoscopy. You may have a sigmoidoscopy every 5 years or a colonoscopy every 10 years starting at age 15. Hepatitis C blood test. Hepatitis B blood test. Sexually transmitted disease (STD) testing. Diabetes screening. This is done by checking your blood sugar (glucose) after you have not eaten for a while (fasting). You may have this done every 1-3 years. Mammogram. This may be done every 1-2 years. Talk to your health care provider about when you should start having regular mammograms. This may depend on whether you have a family history of breast cancer. BRCA-related cancer screening. This may be done if you have a family history of breast, ovarian, tubal, or peritoneal cancers. Pelvic exam and Pap test. This may be done every 3 years starting at age 58. Starting at age 20, this may be done every 5 years if you have a Pap test in combination with an HPV test. Bone density scan. This is done to screen for osteoporosis. You may have this scan if you are at high risk for osteoporosis. Discuss your test results, treatment options, and if necessary, the need for more tests  with your health care provider. Vaccines  Your health care provider may recommend certain vaccines, such as: Influenza  vaccine. This is recommended every year. Tetanus, diphtheria, and acellular pertussis (Tdap, Td) vaccine. You may need a Td booster every 10 years. Zoster vaccine. You may need this after age 63. Pneumococcal 13-valent conjugate (PCV13) vaccine. You may need this if you have certain conditions and were not previously vaccinated. Pneumococcal polysaccharide (PPSV23) vaccine. You may need one or two doses if you smoke cigarettes or if you have certain conditions. Talk to your health care provider about which screenings and vaccines you need and how often you need them. This information is not intended to replace advice given to you by your health care provider. Make sure you discuss any questions you have with your health care provider. Document Released: 03/12/2015 Document Revised: 11/03/2015 Document Reviewed: 12/15/2014 Elsevier Interactive Patient Education  2017 [place] Prevention in the Home Falls can cause injuries. They can happen to people of all ages. There are many things you can do to make your home safe and to help prevent falls. What can I do on the outside of my home? Regularly fix the edges of walkways and driveways and fix any cracks. Remove anything that might make you trip as you walk through a door, such as a raised step or threshold. Trim any bushes or trees on the path to your home. Use bright outdoor lighting. Clear any walking paths of anything that might make someone trip, such as rocks or tools. Regularly check to see if handrails are loose or broken. Make sure that both sides of any steps have handrails. Any raised decks and porches should have guardrails on the edges. Have any leaves, snow, or ice cleared regularly. Use sand or salt on walking paths during winter. Clean up any spills in your garage right away. This includes oil or grease spills. What can I do in the bathroom? Use night lights. Install grab bars by the toilet and in the tub and  shower. Do not use towel bars as grab bars. Use non-skid mats or decals in the tub or shower. If you need to sit down in the shower, use a plastic, non-slip stool. Keep the floor dry. Clean up any water that spills on the floor as soon as it happens. Remove soap buildup in the tub or shower regularly. Attach bath mats securely with double-sided non-slip rug tape. Do not have throw rugs and other things on the floor that can make you trip. What can I do in the bedroom? Use night lights. Make sure that you have a light by your bed that is easy to reach. Do not use any sheets or blankets that are too big for your bed. They should not hang down onto the floor. Have a firm chair that has side arms. You can use this for support while you get dressed. Do not have throw rugs and other things on the floor that can make you trip. What can I do in the kitchen? Clean up any spills right away. Avoid walking on wet floors. Keep items that you use a lot in easy-to-reach places. If you need to reach something above you, use a strong step stool that has a grab bar. Keep electrical cords out of the way. Do not use floor polish or wax that makes floors slippery. If you must use wax, use non-skid floor wax. Do not have throw rugs and other things on the  floor that can make you trip. What can I do with my stairs? Do not leave any items on the stairs. Make sure that there are handrails on both sides of the stairs and use them. Fix handrails that are broken or loose. Make sure that handrails are as long as the stairways. Check any carpeting to make sure that it is firmly attached to the stairs. Fix any carpet that is loose or worn. Avoid having throw rugs at the top or bottom of the stairs. If you do have throw rugs, attach them to the floor with carpet tape. Make sure that you have a light switch at the top of the stairs and the bottom of the stairs. If you do not have them, ask someone to add them for  you. What else can I do to help prevent falls? Wear shoes that: Do not have high heels. Have rubber bottoms. Are comfortable and fit you well. Are closed at the toe. Do not wear sandals. If you use a stepladder: Make sure that it is fully opened. Do not climb a closed stepladder. Make sure that both sides of the stepladder are locked into place. Ask someone to hold it for you, if possible. Clearly mark and make sure that you can see: Any grab bars or handrails. First and last steps. Where the edge of each step is. Use tools that help you move around (mobility aids) if they are needed. These include: Canes. Walkers. Scooters. Crutches. Turn on the lights when you go into a dark area. Replace any light bulbs as soon as they burn out. Set up your furniture so you have a clear path. Avoid moving your furniture around. If any of your floors are uneven, fix them. If there are any pets around you, be aware of where they are. Review your medicines with your doctor. Some medicines can make you feel dizzy. This can increase your chance of falling. Ask your doctor what other things that you can do to help prevent falls. This information is not intended to replace advice given to you by your health care provider. Make sure you discuss any questions you have with your health care provider. Document Released: 12/10/2008 Document Revised: 07/22/2015 Document Reviewed: 03/20/2014 Elsevier Interactive Patient Education  2017 [employer].

## 2021-10-21 ENCOUNTER — Telehealth (INDEPENDENT_AMBULATORY_CARE_PROVIDER_SITE_OTHER): Payer: Self-pay

## 2021-10-21 NOTE — Telephone Encounter (Signed)
Pharmacist from [facility] left a message stating that patient rx had been misplaced and she was requesting for rx to call or sent in. Xanax 0.5mg  #2 has been called to [facility] for patient upcoming laser procedure.

## 2021-10-25 ENCOUNTER — Ambulatory Visit (INDEPENDENT_AMBULATORY_CARE_PROVIDER_SITE_OTHER): Payer: Medicare PPO | Admitting: Vascular Surgery

## 2021-10-25 ENCOUNTER — Encounter (INDEPENDENT_AMBULATORY_CARE_PROVIDER_SITE_OTHER): Payer: Self-pay | Admitting: Vascular Surgery

## 2021-10-25 VITALS — BP 106/66 | HR 66 | Resp 16 | Wt 237.0 lb

## 2021-10-25 DIAGNOSIS — I83893 Varicose veins of bilateral lower extremities with other complications: Secondary | ICD-10-CM

## 2021-10-25 HISTORY — PX: ENDOSCOPIC VEIN LASER TREATMENT: SHX1508

## 2021-10-25 NOTE — Progress Notes (Signed)
[name] is a 51 y.o. female who presents with symptomatic venous reflux  Past Medical History:  Diagnosis Date   Dementia due to another medical condition (HCC)    Trisomy 21-related, sees [name] yearly, nl CT head 2010, did not tolerate aricept   Depression    with OCD   DJD (degenerative joint disease) of knee    bilat knee arthritis, had 3/3 Supartz injections [name])   Down syndrome    Dry ARMD 02/2013   Eczema    Empyema of left pleural space (HCC) 09/24/2011   Hypothyroid    OCD (obsessive compulsive disorder)    OSA on CPAP 05/2012   severe OSA with AHI 118, desat nadir to 67%   Pneumonia    multiple pneumonias   Presenile dementia, uncomplicated (HCC)    Seizure (HCC) 2013   x 2 ; "very mild"; during hospitalization "while coming off vent"    Past Surgical History:  Procedure Laterality Date   ACHILLES TENDON REPAIR Right 01/2017   [facility]   ANTERIOR CRUCIATE LIGAMENT REPAIR Left    FLEXIBLE BRONCHOSCOPY  09/24/2011   Procedure: FLEXIBLE BRONCHOSCOPY;  Surgeon: Christine Nails, MD;  Location: [facility] OR;  Service: Thoracic;  Laterality: N/A;   HYSTEROSCOPY WITH D & C N/A 09/27/2021   Procedure: DILATATION AND CURETTAGE /HYSTEROSCOPY;  Surgeon: Christine Bores, MD;  Location: [facility] OR;  Service: Gynecology;  Laterality: N/A;   LUMBAR DISC SURGERY     ruptured disc   TRACHEOSTOMY TUBE PLACEMENT  10/11/2011   Procedure: TRACHEOSTOMY;  Surgeon: Christine Colonel, MD;  Location: [facility] OR;  Service: ENT;  Laterality: N/A;   VIDEO ASSISTED THORACOSCOPY (VATS) /THOROCOTOMY/RADIOACTIVE SEED IMPLANT Left 09/24/2011   VATS, (Dr. Barry Sosa)     Current Outpatient Medications:    acetaminophen (TYLENOL) 650 MG CR tablet, Take 1,300 mg by mouth every 8 (eight) hours as needed for pain., Disp: , Rfl:    allopurinol (ZYLOPRIM) 100 MG tablet, Take 1 tablet (100 mg total) by mouth daily., Disp: 90 tablet, Rfl: 3   aspirin EC 81 MG tablet, Take 1 tablet (81 mg total) by mouth  daily. Swallow whole., Disp: , Rfl:    clobetasol cream (TEMOVATE) 0.05 %, APPLY TOPICALLY 2 (TWO) TIMES DAILY AS NEEDED (SKIN RASH). DON'T USE MORE THAN 2 WEEKS AT A TIME, AVOID FACE, UNDERARMS, GROIN., Disp: 45 g, Rfl: 0   ibuprofen (ADVIL) 200 MG tablet, Take 600 mg by mouth every 8 (eight) hours as needed for moderate pain., Disp: , Rfl:    levETIRAcetam (KEPPRA) 500 MG tablet, Take 1 tablet (500 mg total) by mouth 2 (two) times daily., Disp: 180 tablet, Rfl: 3   levothyroxine (SYNTHROID) 125 MCG tablet, TAKE 1 TABLET BY MOUTH EVERY DAY BEFORE BREAKFAST, Disp: 90 tablet, Rfl: 0   memantine (NAMENDA) 10 MG tablet, Take 1 tablet (10 mg total) by mouth 2 (two) times daily., Disp: 180 tablet, Rfl: 3   Multiple Vitamins-Minerals (WOMENS MULTI PO), Take 1 tablet by mouth daily. , Disp: , Rfl:    sertraline (ZOLOFT) 50 MG tablet, TAKE 1 TABLET BY MOUTH EVERYDAY AT BEDTIME, Disp: 90 tablet, Rfl: 3   silver sulfADIAZINE (SILVADENE) 1 % cream, Apply 1 application. topically daily. (Patient not taking: Reported on 08/31/2021), Disp: 50 g, Rfl: 0  No Known Allergies   Varicose veins of leg with swelling, bilateral     PLAN: The patient's left lower extremity was sterilely prepped and draped. The ultrasound machine was  used to visualize the saphenous vein throughout its course. A segment in the upper calf was selected for access. The saphenous vein was accessed without difficulty using ultrasound guidance with a micropuncture needle. A 0.018 wire was then placed beyond the saphenofemoral junction and the needle was removed. The 65 cm sheath was then placed over the wire and the wire and dilator were removed. The laser fiber was then placed through the sheath and its tip was placed approximately 6-7 centimeters below the saphenofemoral junction. Tumescent anesthesia was then created with a dilute lidocaine solution. Laser energy was then delivered with constant withdrawal of the sheath and laser fiber.  Approximately 1071 joules of energy were delivered over a length of 29 centimeters using a 1470 Hz VenaCure machine at 7 W. Sterile dressings were placed. The patient tolerated the procedure well without obvious complications.  Of note, the patient's continuous motion and size did make the procedure more difficult and we ensured we were a little further from the saphenofemoral junction in the normal to avoid DVT.  Follow-up in 1 week with post-laser duplex.

## 2021-10-26 ENCOUNTER — Ambulatory Visit (INDEPENDENT_AMBULATORY_CARE_PROVIDER_SITE_OTHER): Payer: Medicare PPO | Admitting: Family Medicine

## 2021-10-26 ENCOUNTER — Encounter: Payer: Self-pay | Admitting: Family Medicine

## 2021-10-26 DIAGNOSIS — N8501 Benign endometrial hyperplasia: Secondary | ICD-10-CM

## 2021-10-26 NOTE — Progress Notes (Signed)
Patient presents for Post-Op Visit today.  09/27/21 : DILATATION AND CURETTAGE /HYSTEROSCOPY EXAM UNDER ANESTHESIA AND PAP SMEAR   No Concerns today.

## 2021-10-26 NOTE — Assessment & Plan Note (Signed)
Discussed treatment options including hysterectomy, oral progestins, maybe not a great option, given h/o DVT, and progesterone containing IUD. The patient's mother desires for prog containing IUD with yearly repeat sampling under anesthesia.

## 2021-10-26 NOTE — Progress Notes (Signed)
   Subjective:    Patient ID: [name] is a 51 y.o. female presenting with Routine Post Op  on 10/26/2021  HPI: Patient here to f/u surgery. Patient had an EUA and pap and D & C due to bleeding on anti-coagulation related to prior DVT. Pap was WNL and endometrial sampling showed simple endometrial hyperplasia without atypia.  Review of Systems  Constitutional:  Negative for chills and fever.  Respiratory:  Negative for shortness of breath.   Cardiovascular:  Negative for chest pain.  Gastrointestinal:  Negative for abdominal pain, nausea and vomiting.  Genitourinary:  Negative for dysuria.  Skin:  Negative for rash.      Objective:    BP 115/76   Pulse 79   Wt 243 lb (110.2 kg)   BMI 49.08 kg/m  Physical Exam Exam conducted with a chaperone present.  Constitutional:      General: She is not in acute distress.    Appearance: She is well-developed.  HENT:     Head: Normocephalic and atraumatic.  Eyes:     General: No scleral icterus. Cardiovascular:     Rate and Rhythm: Normal rate.  Pulmonary:     Effort: Pulmonary effort is normal.  Abdominal:     Palpations: Abdomen is soft.  Musculoskeletal:     Cervical back: Neck supple.  Skin:    General: Skin is warm and dry.  Neurological:     Mental Status: She is alert and oriented to person, place, and time.         Assessment & Plan:   Problem List Items Addressed This Visit       Unprioritized   Endometrial hyperplasia, simple    Discussed treatment options including hysterectomy, oral progestins, maybe not a great option, given h/o DVT, and progesterone containing IUD. The patient's mother desires for prog containing IUD with yearly repeat sampling under anesthesia.       Return in about 6 months (around 04/27/2022).  Reva Bores, MD 10/26/2021 11:11 AM

## 2021-10-27 ENCOUNTER — Encounter (HOSPITAL_BASED_OUTPATIENT_CLINIC_OR_DEPARTMENT_OTHER): Payer: Self-pay | Admitting: Family Medicine

## 2021-10-27 ENCOUNTER — Telehealth: Payer: Self-pay

## 2021-10-27 ENCOUNTER — Other Ambulatory Visit (INDEPENDENT_AMBULATORY_CARE_PROVIDER_SITE_OTHER): Payer: Self-pay | Admitting: Vascular Surgery

## 2021-10-27 DIAGNOSIS — I83893 Varicose veins of bilateral lower extremities with other complications: Secondary | ICD-10-CM

## 2021-10-27 NOTE — H&P (Signed)
[name] is an 51 y.o. No obstetric history on file. female.   Chief Complaint: vaginal bleeding HPI: Patient with h/o DVT and bleeding on Eliquis. Previously had D & C with endometrial sampling which revealed simple endometrial hyperplasia.  Past Medical History:  Diagnosis Date   Dementia due to another medical condition (HCC)    Trisomy 21-related, sees Sethi yearly, nl CT head 2010, did not tolerate aricept   Depression    with OCD   DJD (degenerative joint disease) of knee    bilat knee arthritis, had 3/3 Supartz injections [name])   Down syndrome    Dry ARMD 02/2013   Eczema    Empyema of left pleural space (HCC) 09/24/2011   Hypothyroid    OCD (obsessive compulsive disorder)    OSA on CPAP 05/2012   severe OSA with AHI 118, desat nadir to 67%   Pneumonia    multiple pneumonias   Presenile dementia, uncomplicated (HCC)    Seizure (HCC) 2013   x 2 ; "very mild"; during hospitalization "while coming off vent"    Past Surgical History:  Procedure Laterality Date   ACHILLES TENDON REPAIR Right 01/2017   [facility]   ANTERIOR CRUCIATE LIGAMENT REPAIR Left    FLEXIBLE BRONCHOSCOPY  09/24/2011   Procedure: FLEXIBLE BRONCHOSCOPY;  Surgeon: Purcell Nails, MD;  Location: [facility] OR;  Service: Thoracic;  Laterality: N/A;   HYSTEROSCOPY WITH D & C N/A 09/27/2021   Procedure: DILATATION AND CURETTAGE /HYSTEROSCOPY;  Surgeon: Reva Bores, MD;  Location: [facility] OR;  Service: Gynecology;  Laterality: N/A;   LUMBAR DISC SURGERY     ruptured disc   TRACHEOSTOMY TUBE PLACEMENT  10/11/2011   Procedure: TRACHEOSTOMY;  Surgeon: Serena Colonel, MD;  Location: [facility] OR;  Service: ENT;  Laterality: N/A;   VIDEO ASSISTED THORACOSCOPY (VATS) /THOROCOTOMY/RADIOACTIVE SEED IMPLANT Left 09/24/2011   VATS, (Dr. Barry Dienes)    Family History  Problem Relation Age of Onset   Arthritis Mother    Hypertension Father    Diabetes Father    Cancer Neg Hx    Coronary artery disease Neg Hx     Stroke Neg Hx    Social History:  reports that she has never smoked. She has never used smokeless tobacco. She reports that she does not drink alcohol and does not use drugs.  Allergies: No Known Allergies  No medications prior to admission.    A comprehensive review of systems was negative.  Blood pressure (!) 102/50, pulse 94, temperature (!) 97.2 F (36.2 C), temperature source Oral, resp. rate 16, height 4\' 11"  (1.499 m), weight 108.8 kg, SpO2 94 %. General appearance: alert, cooperative, and appears stated age Head: Normocephalic, without obvious abnormality, atraumatic Neck: supple, symmetrical, trachea midline Lungs:  normal effort Heart: regular rate and rhythm Abdomen: soft, non-tender; bowel sounds normal; no masses,  no organomegaly Extremities: extremities normal, atraumatic, no cyanosis or edema Skin: Skin color, texture, turgor normal. No rashes or lesions Neurologic: Grossly normal  Results for orders placed or performed during the hospital encounter of 11/07/21 (from the past 24 hour(s))  CBC     Status: Abnormal   Collection Time: 11/07/21  8:00 AM  Result Value Ref Range   WBC 4.8 4.0 - 10.5 K/uL   RBC 4.05 3.87 - 5.11 MIL/uL   Hemoglobin 14.1 12.0 - 15.0 g/dL   HCT 01/07/22 36.1 - 44.3 %   MCV 108.6 (H) 80.0 - 100.0 fL   MCH 34.8 (H)  26.0 - 34.0 pg   MCHC 32.0 30.0 - 36.0 g/dL   RDW 56.3 14.9 - 70.2 %   Platelets 169 150 - 400 K/uL   nRBC 0.0 0.0 - 0.2 %     Assessment/Plan Principal Problem:   Endometrial hyperplasia, simple  For IUD placement Risks include but are not limited to bleeding, infection, injury to surrounding structures, including bowel, bladder and ureters, blood clots, and death.  Likelihood of success is high.    Reva Bores 10/27/2021, 12:52 PM

## 2021-10-27 NOTE — Telephone Encounter (Signed)
Called and spoke to patient's mother, opted for 09/11 surgery date.

## 2021-10-28 ENCOUNTER — Encounter (HOSPITAL_BASED_OUTPATIENT_CLINIC_OR_DEPARTMENT_OTHER): Payer: Self-pay | Admitting: Family Medicine

## 2021-10-28 NOTE — Progress Notes (Signed)
Spoke w/ via phone for pre-op interview--- pt's mother, [name] needs dos----    CBC           Sosa results------ current ekg in epic/ chart COVID test -----patient states asymptomatic no test needed Arrive at -------  1045 on 11-07-2021 NPO after MN NO Solid Food.  Clear liquids from MN until---  0945 Med rec completed Medications to take morning of surgery ----- keppra Diabetic medication ----- n/a Patient instructed no nail polish to be worn day of surgery Patient instructed to bring photo id and insurance card day of surgery Patient aware to have Driver (ride ) / caregiver for 24 hours after surgery -- either father or mother or both Patient Special Instructions ----- n/a Pre-Op special Istructions -----  parent to pre-op pt has down syndrome w/ dementia.  Lives with both parents whom sign's her documents/ consents.    Patient verbalized understanding of instructions that were given at this phone interview. Patient denies shortness of breath, chest pain, fever, cough at this phone interview.

## 2021-11-01 ENCOUNTER — Ambulatory Visit (INDEPENDENT_AMBULATORY_CARE_PROVIDER_SITE_OTHER): Payer: Medicare PPO

## 2021-11-01 DIAGNOSIS — I83893 Varicose veins of bilateral lower extremities with other complications: Secondary | ICD-10-CM | POA: Diagnosis not present

## 2021-11-02 ENCOUNTER — Other Ambulatory Visit: Payer: Self-pay | Admitting: Family Medicine

## 2021-11-03 ENCOUNTER — Telehealth (INDEPENDENT_AMBULATORY_CARE_PROVIDER_SITE_OTHER): Payer: Self-pay | Admitting: Nurse Practitioner

## 2021-11-03 NOTE — Telephone Encounter (Signed)
Patients mom called in stating that she is having extreme pain in the leg she had laser in. Patient is complaining and struggling to lift her foot as well she crys when she has to walk.   Please call patients mother back for more details and advise

## 2021-11-03 NOTE — Telephone Encounter (Signed)
Patient mother was advise to continue with alternating tylenol and ibuprofen for pain. Patient will appointment as schedule. Patient mother was advise to contact the office if she has any concerns.

## 2021-11-04 NOTE — Telephone Encounter (Signed)
ERx 

## 2021-11-04 NOTE — Telephone Encounter (Signed)
Clobetasol cream last filled:  08/27/21, #45 g Levothyroxine last filled:  09/12/21, #90 Last OV:  05/20/21, vaginal bleeding Next OV:  none

## 2021-11-04 NOTE — Progress Notes (Signed)
Spoke with Belva Agee, Khiana's mother, concerning arrival time change. They should arrive at 8:45 AM and nothing to eat or drink after 7:45 AM. [name] repeated instructions back to verify.

## 2021-11-06 NOTE — Anesthesia Preprocedure Evaluation (Signed)
Anesthesia Evaluation  Patient identified by MRN, date of birth, ID band Patient awake    Reviewed: Allergy & Precautions, NPO status , Patient's Chart, lab work & pertinent test results  Airway Mallampati: II  TM Distance: >3 FB Neck ROM: Full    Dental  (+) Dental Advisory Given, Teeth Intact, Chipped   Pulmonary sleep apnea and Continuous Positive Airway Pressure Ventilation , pneumonia, resolved,  Hx/o tracheostomy 2013 Hx/o empyema 2013 S/P VATS   Pulmonary exam normal breath sounds clear to auscultation       Cardiovascular negative cardio ROS Normal cardiovascular exam Rhythm:Regular Rate:Normal     Neuro/Psych Seizures -, Well Controlled,  PSYCHIATRIC DISORDERS Anxiety Depression Dementia [name] OCD Neuromuscular disease    GI/Hepatic negative GI ROS, Neg liver ROS,   Endo/Other  Hypothyroidism Morbid obesity  Renal/GU negative Renal ROS     Musculoskeletal  (+) Arthritis , Osteoarthritis,  Eczema   Abdominal (+) + obese,   Peds  Hematology   Anesthesia Other Findings   Reproductive/Obstetrics PMB Thickened endometrium                            Anesthesia Physical  Anesthesia Plan  ASA: 3  Anesthesia Plan: General   Post-op Pain Management: Ofirmev IV (intra-op)*   Induction: Intravenous  PONV Risk Score and Plan: 4 or greater and Treatment may vary due to age or medical condition, Midazolam, Ondansetron and Dexamethasone  Airway Management Planned: LMA  Additional Equipment: None  Intra-op Plan:   Post-operative Plan: Extubation in OR  Informed Consent: I have reviewed the patients History and Physical, chart, labs and discussed the procedure including the risks, benefits and alternatives for the proposed anesthesia with the patient or authorized representative who has indicated his/her understanding and acceptance.     Dental advisory given  Plan  Discussed with: CRNA  Anesthesia Plan Comments:        Anesthesia Quick Evaluation

## 2021-11-07 ENCOUNTER — Other Ambulatory Visit: Payer: Self-pay

## 2021-11-07 ENCOUNTER — Encounter (HOSPITAL_BASED_OUTPATIENT_CLINIC_OR_DEPARTMENT_OTHER): Payer: Self-pay | Admitting: Family Medicine

## 2021-11-07 ENCOUNTER — Ambulatory Visit (HOSPITAL_BASED_OUTPATIENT_CLINIC_OR_DEPARTMENT_OTHER): Payer: Medicare PPO | Admitting: Anesthesiology

## 2021-11-07 ENCOUNTER — Encounter (HOSPITAL_BASED_OUTPATIENT_CLINIC_OR_DEPARTMENT_OTHER)
Admission: RE | Disposition: A | Discharge status: Home / Self Care | Payer: Self-pay | Source: Home / Self Care | Attending: Family Medicine

## 2021-11-07 ENCOUNTER — Ambulatory Visit (HOSPITAL_BASED_OUTPATIENT_CLINIC_OR_DEPARTMENT_OTHER)
Admission: RE | Admit: 2021-11-07 | Discharge: 2021-11-07 | Disposition: A | Discharge status: Home / Self Care | Payer: Medicare PPO | Attending: Family Medicine | Admitting: Family Medicine

## 2021-11-07 DIAGNOSIS — R569 Unspecified convulsions: Secondary | ICD-10-CM | POA: Diagnosis not present

## 2021-11-07 DIAGNOSIS — G4730 Sleep apnea, unspecified: Secondary | ICD-10-CM | POA: Diagnosis not present

## 2021-11-07 DIAGNOSIS — Z9989 Dependence on other enabling machines and devices: Secondary | ICD-10-CM

## 2021-11-07 DIAGNOSIS — N8501 Benign endometrial hyperplasia: Secondary | ICD-10-CM

## 2021-11-07 DIAGNOSIS — F32A Depression, unspecified: Secondary | ICD-10-CM | POA: Insufficient documentation

## 2021-11-07 DIAGNOSIS — Q909 Down syndrome, unspecified: Secondary | ICD-10-CM | POA: Diagnosis not present

## 2021-11-07 DIAGNOSIS — L309 Dermatitis, unspecified: Secondary | ICD-10-CM | POA: Insufficient documentation

## 2021-11-07 DIAGNOSIS — Z6841 Body Mass Index (BMI) 40.0 and over, adult: Secondary | ICD-10-CM | POA: Insufficient documentation

## 2021-11-07 DIAGNOSIS — Z30430 Encounter for insertion of intrauterine contraceptive device: Secondary | ICD-10-CM

## 2021-11-07 DIAGNOSIS — F419 Anxiety disorder, unspecified: Secondary | ICD-10-CM | POA: Diagnosis not present

## 2021-11-07 DIAGNOSIS — M1990 Unspecified osteoarthritis, unspecified site: Secondary | ICD-10-CM | POA: Insufficient documentation

## 2021-11-07 DIAGNOSIS — E039 Hypothyroidism, unspecified: Secondary | ICD-10-CM | POA: Insufficient documentation

## 2021-11-07 DIAGNOSIS — Z01818 Encounter for other preprocedural examination: Secondary | ICD-10-CM

## 2021-11-07 DIAGNOSIS — G4733 Obstructive sleep apnea (adult) (pediatric): Secondary | ICD-10-CM

## 2021-11-07 DIAGNOSIS — N8500 Endometrial hyperplasia, unspecified: Secondary | ICD-10-CM | POA: Diagnosis not present

## 2021-11-07 HISTORY — DX: Personal history of pneumonia (recurrent): Z87.01

## 2021-11-07 HISTORY — DX: Unspecified osteoarthritis, unspecified site: M19.90

## 2021-11-07 HISTORY — DX: Prediabetes: R73.03

## 2021-11-07 HISTORY — DX: Major depressive disorder, single episode, unspecified: F32.9

## 2021-11-07 HISTORY — PX: INTRAUTERINE DEVICE (IUD) INSERTION: SHX5877

## 2021-11-07 HISTORY — DX: Asymptomatic varicose veins of bilateral lower extremities: I83.93

## 2021-11-07 HISTORY — DX: Other injury of unspecified body region, initial encounter: T14.8XXA

## 2021-11-07 HISTORY — DX: Presence of spectacles and contact lenses: Z97.3

## 2021-11-07 HISTORY — DX: Hypothyroidism, unspecified: E03.9

## 2021-11-07 HISTORY — DX: Idiopathic chronic gout, unspecified knee, without tophus (tophi): M1A.0690

## 2021-11-07 HISTORY — DX: Localized edema: R60.0

## 2021-11-07 HISTORY — DX: Endometrial hyperplasia, unspecified: N85.00

## 2021-11-07 LAB — CBC
HCT: 44 % (ref 36.0–46.0)
Hemoglobin: 14.1 g/dL (ref 12.0–15.0)
MCH: 34.8 pg — ABNORMAL HIGH (ref 26.0–34.0)
MCHC: 32 g/dL (ref 30.0–36.0)
MCV: 108.6 fL — ABNORMAL HIGH (ref 80.0–100.0)
Platelets: 169 10*3/uL (ref 150–400)
RBC: 4.05 MIL/uL (ref 3.87–5.11)
RDW: 14.6 % (ref 11.5–15.5)
WBC: 4.8 10*3/uL (ref 4.0–10.5)
nRBC: 0 % (ref 0.0–0.2)

## 2021-11-07 LAB — POCT PREGNANCY, URINE: Preg Test, Ur: NEGATIVE

## 2021-11-07 SURGERY — EXAM UNDER ANESTHESIA
Anesthesia: General | Site: Vagina

## 2021-11-07 MED ORDER — PROMETHAZINE HCL 25 MG/ML IJ SOLN: 6.2500 mg | INTRAMUSCULAR | Status: DC | PRN

## 2021-11-07 MED ORDER — POVIDONE-IODINE 10 % EX SWAB: 2.0000 | Freq: Once | CUTANEOUS | Status: DC

## 2021-11-07 MED ORDER — PROPOFOL 10 MG/ML IV BOLUS
INTRAVENOUS | Status: DC | PRN
  Administered 2021-11-07: 100 mg via INTRAVENOUS
  Administered 2021-11-07: 200 mg via INTRAVENOUS

## 2021-11-07 MED ORDER — LEVONORGESTREL 20 MCG/DAY IU IUD
1.0000 | INTRAUTERINE_SYSTEM | INTRAUTERINE | Status: AC
  Administered 2021-11-07: 1 via INTRAUTERINE

## 2021-11-07 MED ORDER — EPHEDRINE SULFATE-NACL 50-0.9 MG/10ML-% IV SOSY
PREFILLED_SYRINGE | INTRAVENOUS | Status: DC | PRN
  Administered 2021-11-07: 15 mg via INTRAVENOUS
  Administered 2021-11-07: 10 mg via INTRAVENOUS

## 2021-11-07 MED ORDER — LIDOCAINE HCL (PF) 2 % IJ SOLN
INTRAMUSCULAR | Status: AC
  Filled 2021-11-07: qty 5

## 2021-11-07 MED ORDER — PROPOFOL 10 MG/ML IV BOLUS
INTRAVENOUS | Status: AC
  Filled 2021-11-07: qty 20

## 2021-11-07 MED ORDER — HYDROMORPHONE HCL 1 MG/ML IJ SOLN
INTRAMUSCULAR | Status: AC
  Filled 2021-11-07: qty 1

## 2021-11-07 MED ORDER — LIDOCAINE 2% (20 MG/ML) 5 ML SYRINGE
INTRAMUSCULAR | Status: DC | PRN
  Administered 2021-11-07: 80 mg via INTRAVENOUS

## 2021-11-07 MED ORDER — OXYCODONE HCL 5 MG/5ML PO SOLN: 5.0000 mg | Freq: Once | ORAL | Status: DC | PRN

## 2021-11-07 MED ORDER — LEVONORGESTREL 20 MCG/DAY IU IUD
INTRAUTERINE_SYSTEM | INTRAUTERINE | Status: AC
  Filled 2021-11-07: qty 1

## 2021-11-07 MED ORDER — MEPERIDINE HCL 25 MG/ML IJ SOLN: 6.2500 mg | INTRAMUSCULAR | Status: DC | PRN

## 2021-11-07 MED ORDER — LACTATED RINGERS IV SOLN
INTRAVENOUS | Status: DC
Start: 2021-11-07 — End: 2021-11-07

## 2021-11-07 MED ORDER — ONDANSETRON HCL 4 MG/2ML IJ SOLN
INTRAMUSCULAR | Status: AC
  Filled 2021-11-07: qty 2

## 2021-11-07 MED ORDER — FENTANYL CITRATE (PF) 100 MCG/2ML IJ SOLN
INTRAMUSCULAR | Status: AC
  Filled 2021-11-07: qty 2

## 2021-11-07 MED ORDER — MIDAZOLAM HCL 2 MG/2ML IJ SOLN
INTRAMUSCULAR | Status: AC
  Filled 2021-11-07: qty 2

## 2021-11-07 MED ORDER — DEXAMETHASONE SODIUM PHOSPHATE 10 MG/ML IJ SOLN
INTRAMUSCULAR | Status: DC | PRN
  Administered 2021-11-07: 5 mg via INTRAVENOUS

## 2021-11-07 MED ORDER — DEXAMETHASONE SODIUM PHOSPHATE 10 MG/ML IJ SOLN
INTRAMUSCULAR | Status: AC
  Filled 2021-11-07: qty 1

## 2021-11-07 MED ORDER — OXYCODONE HCL 5 MG PO TABS: 5.0000 mg | ORAL_TABLET | Freq: Once | ORAL | Status: DC | PRN

## 2021-11-07 MED ORDER — ACETAMINOPHEN 500 MG PO TABS
ORAL_TABLET | ORAL | Status: AC
  Filled 2021-11-07: qty 1

## 2021-11-07 MED ORDER — 0.9 % SODIUM CHLORIDE (POUR BTL) OPTIME
TOPICAL | Status: DC | PRN
  Administered 2021-11-07: 500 mL

## 2021-11-07 MED ORDER — ONDANSETRON HCL 4 MG/2ML IJ SOLN
INTRAMUSCULAR | Status: DC | PRN
  Administered 2021-11-07: 4 mg via INTRAVENOUS

## 2021-11-07 MED ORDER — FENTANYL CITRATE (PF) 100 MCG/2ML IJ SOLN
INTRAMUSCULAR | Status: DC | PRN
  Administered 2021-11-07: 25 ug via INTRAVENOUS

## 2021-11-07 MED ORDER — ACETAMINOPHEN 500 MG PO TABS
1000.0000 mg | ORAL_TABLET | ORAL | Status: AC
Start: 2021-11-07 — End: 2021-11-07
  Administered 2021-11-07: 1000 mg via ORAL

## 2021-11-07 MED ORDER — AMISULPRIDE (ANTIEMETIC) 5 MG/2ML IV SOLN: 10.0000 mg | Freq: Once | INTRAVENOUS | Status: DC | PRN

## 2021-11-07 MED ORDER — HYDROMORPHONE HCL 1 MG/ML IJ SOLN
0.2500 mg | INTRAMUSCULAR | Status: DC | PRN
  Administered 2021-11-07 (×2): 0.25 mg via INTRAVENOUS

## 2021-11-07 SURGICAL SUPPLY — 14 items
BRIEF MESH DISP LRG (UNDERPADS AND DIAPERS) ×1 IMPLANT
DILATOR CANAL MILEX (MISCELLANEOUS) IMPLANT
GAUZE 4X4 16PLY ~~LOC~~+RFID DBL (SPONGE) ×1 IMPLANT
GLOVE BIOGEL PI IND STRL 7.0 (GLOVE) ×1 IMPLANT
GLOVE ECLIPSE 7.0 STRL STRAW (GLOVE) ×1 IMPLANT
GOWN STRL REUS W/TWL XL LVL3 (GOWN DISPOSABLE) ×2 IMPLANT
IUD  Mirena IMPLANT
KIT TURNOVER CYSTO (KITS) ×1 IMPLANT
NS IRRIG 500ML POUR BTL (IV SOLUTION) ×1 IMPLANT
PACK VAGINAL MINOR WOMEN LF (CUSTOM PROCEDURE TRAY) ×1 IMPLANT
PAD OB MATERNITY 4.3X12.25 (PERSONAL CARE ITEMS) ×1 IMPLANT
PAD PREP 24X48 CUFFED NSTRL (MISCELLANEOUS) ×1 IMPLANT
SOL PREP POV-IOD 4OZ 10% (MISCELLANEOUS) IMPLANT
TOWEL OR 17X26 10 PK STRL BLUE (TOWEL DISPOSABLE) ×2 IMPLANT

## 2021-11-07 NOTE — Discharge Instructions (Addendum)
No acetaminophen/Tylenol until after 4:30 pm today if needed.   Post Anesthesia Home Care Instructions  Activity: Get plenty of rest for the remainder of the day. A responsible individual must stay with you for 24 hours following the procedure.  For the next 24 hours, DO NOT: -Drive a car -Operate machinery -Drink alcoholic beverages -Take any medication unless instructed by your physician -Make any legal decisions or sign important papers.  Meals: Start with liquid foods such as gelatin or soup. Progress to regular foods as tolerated. Avoid greasy, spicy, heavy foods. If nausea and/or vomiting occur, drink only clear liquids until the nausea and/or vomiting subsides. Call your physician if vomiting continues.  Special Instructions/Symptoms: Your throat may feel dry or sore from the anesthesia or the breathing tube placed in your throat during surgery. If this causes discomfort, gargle with warm salt water. The discomfort should disappear within 24 hours.     

## 2021-11-07 NOTE — Op Note (Signed)
Preoperative diagnosis: Unable to tolerate exam in the office, Need for IUD insertion, simple endometrial hyperplasia  Postoperative diagnosis: Same  Procedure: Exam under anesthesia, Mirena insertion  Surgeon: Darron Doom, M.D.  Anesthesia: MAC, Nolon Nations, MD  Findings: 8 week size uterus, small cervix  Estimated blood loss: Less than 25 cc  Specimen: None  Reason for procedure: Patient is a 51 y.o. No obstetric history on file. patient who is unable to tolerate exam in the office. She has simple endometrial hyperplasia and after consultation would like an IUD placed.  Procedure: Patient taken to the OR where she was placed in dorsal lithotomy in [place]. A timeout was performed. A speculum was placed inside the vagina. The cervix was visualized and an os finder used to open the cervix. Mirena placed easily according to instructions. The patient was awakened. All instrument, needle, and lap counts correct x2. The patient was taken to recovery, stable.  Donnamae Jude, MD 11/07/2021 10:58 AM

## 2021-11-07 NOTE — Anesthesia Procedure Notes (Signed)
Procedure Name: LMA Insertion Date/Time: 11/07/2021 10:45 AM  Performed by: Norva Pavlov, CRNAPre-anesthesia Checklist: Patient identified, Emergency Drugs available, Suction available and Patient being monitored Patient Re-evaluated:Patient Re-evaluated prior to induction Oxygen Delivery Method: Circle system utilized Preoxygenation: Pre-oxygenation with 100% oxygen Induction Type: IV induction Ventilation: Mask ventilation without difficulty LMA: LMA inserted LMA Size: 4.0 Number of attempts: 1 Airway Equipment and Method: Bite block Placement Confirmation: positive ETCO2 Tube secured with: Tape Dental Injury: Teeth and Oropharynx as per pre-operative assessment

## 2021-11-07 NOTE — Transfer of Care (Signed)
Immediate Anesthesia Transfer of Care Note  Patient: [name]  Procedure(s) Performed: Procedure(s) (LRB): EXAM UNDER ANESTHESIA (N/A) INTRAUTERINE DEVICE (IUD) INSERTION (N/A)  Patient Location: PACU  Anesthesia Type: General  Level of Consciousness: awake, alert  and oriented  Airway & Oxygen Therapy: Patient Spontanous Breathing and Patient connected to face mask oxygen  Post-op Assessment: Report given to PACU RN and Post -op Vital signs reviewed and stable  Post vital signs: Reviewed and stable  Complications: No apparent anesthesia complications  Last Vitals:  Vitals Value Taken Time  BP 98/58 11/07/21 1111  Temp    Pulse 84 11/07/21 1114  Resp 21 11/07/21 1114  SpO2 100 % 11/07/21 1114  Vitals shown include unvalidated device data.  Last Pain:  Vitals:   11/07/21 0822  TempSrc: Oral      Patients Stated Pain Goal: 3 (11/07/21 0539)  Complications: No notable events documented.

## 2021-11-07 NOTE — Anesthesia Postprocedure Evaluation (Signed)
Anesthesia Post Note  Patient: [name]  Procedure(s) Performed: Francia Greaves UNDER ANESTHESIA (Vagina ) INTRAUTERINE DEVICE (IUD) INSERTION (Vagina )     Patient location during evaluation: PACU Anesthesia Type: General Level of consciousness: sedated and patient cooperative Pain management: pain level controlled Vital Signs Assessment: post-procedure vital signs reviewed and stable Respiratory status: spontaneous breathing Cardiovascular status: stable Anesthetic complications: no   No notable events documented.  Last Vitals:  Vitals:   11/07/21 1200 11/07/21 1215  BP: 112/66 115/63  Pulse: 78 71  Resp: 16 15  Temp:  36.5 C  SpO2: 100% (!) 89%    Last Pain:  Vitals:   11/07/21 1215  TempSrc:   PainSc: 0-No pain                 Lewie Loron

## 2021-11-08 ENCOUNTER — Encounter (HOSPITAL_BASED_OUTPATIENT_CLINIC_OR_DEPARTMENT_OTHER): Payer: Self-pay | Admitting: Family Medicine

## 2021-11-14 ENCOUNTER — Encounter: Payer: Self-pay | Admitting: Podiatry

## 2021-11-14 ENCOUNTER — Ambulatory Visit (INDEPENDENT_AMBULATORY_CARE_PROVIDER_SITE_OTHER): Payer: Medicare PPO | Admitting: Podiatry

## 2021-11-14 ENCOUNTER — Telehealth: Payer: Self-pay | Admitting: Adult Health

## 2021-11-14 DIAGNOSIS — B351 Tinea unguium: Secondary | ICD-10-CM

## 2021-11-14 DIAGNOSIS — M79674 Pain in right toe(s): Secondary | ICD-10-CM | POA: Diagnosis not present

## 2021-11-14 DIAGNOSIS — I872 Venous insufficiency (chronic) (peripheral): Secondary | ICD-10-CM | POA: Diagnosis not present

## 2021-11-14 DIAGNOSIS — M79675 Pain in left toe(s): Secondary | ICD-10-CM | POA: Diagnosis not present

## 2021-11-14 DIAGNOSIS — M25371 Other instability, right ankle: Secondary | ICD-10-CM | POA: Diagnosis not present

## 2021-11-14 NOTE — Telephone Encounter (Signed)
[name] called and LVM stating that the medication that the pt is on is "definitely not strong enough" and that she would give the details when the nurse calls her back. Please advise.

## 2021-11-14 NOTE — Progress Notes (Signed)
This patient returns to my office for at risk foot care.  This patient requires this care by a professional since this patient will be at risk due to having venous stasis and diabetes.  This patient is unable to cut nails herself since the patient cannot reach her nails.These nails are painful walking and wearing shoes.  This patient presents for at risk foot care today.  General Appearance  Alert, conversant and in no acute stress.  Vascular  Dorsalis pedis and posterior tibial  pulses are not palpable due to swelling. bilaterally.  Capillary return is within normal limits  bilaterally. Temperature is within normal limits  bilaterally. Venous stasis legs  B/l.  Neurologic  Senn-Weinstein monofilament wire test within normal limits  bilaterally. Muscle power within normal limits bilaterally.  Nails Thick disfigured discolored nails with subungual debris  from hallux to fifth toes bilaterally. No evidence of bacterial infection or drainage bilaterally.  Orthopedic  No limitations of motion  feet .  No crepitus or effusions noted.  No bony pathology or digital deformities noted.  Skin  normotropic skin with no porokeratosis noted bilaterally.  No signs of infections or ulcers noted.     Onychomycosis  Pain in right toes  Pain in left toes  Consent was obtained for treatment procedures.   Mechanical debridement of nails 1-5  bilaterally performed with a nail nipper.  Filed with dremel without incident. Patient says her ankle hurt and I told her to make an appointment with medical doctors.   Return office visit   4 months                   Told patient to return for periodic foot care and evaluation due to potential at risk complications.   Gardiner Barefoot DPM

## 2021-11-15 MED ORDER — SERTRALINE HCL 100 MG PO TABS: 100.0000 mg | ORAL_TABLET | Freq: Every day | ORAL | 5 refills | Status: DC

## 2021-11-15 NOTE — Telephone Encounter (Signed)
Called pt mother back she appreciated the call. She stated she will call back if symptoms persist after medication in full effect.

## 2021-11-15 NOTE — Telephone Encounter (Signed)
Okay to increase sertraline to 100mg  nightly. Need to give at least 4-6 weeks to take full effect, if symptoms persist after that time, may need to establish care with behavioral health for further recommendations/management.

## 2021-11-15 NOTE — Telephone Encounter (Signed)
Called pt mother back and spoke with her. She discussed that the medication Sertraline is not strong enough and would like to increase the dosage. She also states that the pt has became more aggressive towards mother.

## 2021-11-21 ENCOUNTER — Telehealth: Payer: Self-pay | Admitting: *Deleted

## 2021-11-21 NOTE — Telephone Encounter (Signed)
[name] called stating pt was having cramping and irregular bleeding since IUD was placed. Advised pt can take 800mg  of Ibuprofen every 8 hours for the next few days and see if that helps with the cramping, and also may help the bleeding. Advised that irregular bleeding is normal and can last for a few months. Informed that if cramping gets worse or has more pain to reach out to [place] as we may need to check IUD placement. Pt mom verbalizes understanding

## 2021-11-22 ENCOUNTER — Telehealth (INDEPENDENT_AMBULATORY_CARE_PROVIDER_SITE_OTHER): Payer: Self-pay | Admitting: Vascular Surgery

## 2021-11-22 ENCOUNTER — Encounter (INDEPENDENT_AMBULATORY_CARE_PROVIDER_SITE_OTHER): Payer: Self-pay | Admitting: Vascular Surgery

## 2021-11-22 ENCOUNTER — Ambulatory Visit (INDEPENDENT_AMBULATORY_CARE_PROVIDER_SITE_OTHER): Payer: Medicare PPO | Admitting: Vascular Surgery

## 2021-11-22 VITALS — BP 123/68 | HR 61 | Resp 18 | Ht <= 58 in | Wt 243.0 lb

## 2021-11-22 DIAGNOSIS — Z6841 Body Mass Index (BMI) 40.0 and over, adult: Secondary | ICD-10-CM | POA: Diagnosis not present

## 2021-11-22 DIAGNOSIS — I83893 Varicose veins of bilateral lower extremities with other complications: Secondary | ICD-10-CM

## 2021-11-22 DIAGNOSIS — R7303 Prediabetes: Secondary | ICD-10-CM | POA: Diagnosis not present

## 2021-11-22 DIAGNOSIS — I890 Lymphedema, not elsewhere classified: Secondary | ICD-10-CM | POA: Diagnosis not present

## 2021-11-22 DIAGNOSIS — I82432 Acute embolism and thrombosis of left popliteal vein: Secondary | ICD-10-CM

## 2021-11-22 DIAGNOSIS — Q909 Down syndrome, unspecified: Secondary | ICD-10-CM | POA: Diagnosis not present

## 2021-11-22 NOTE — Telephone Encounter (Signed)
Patient's mother called and LVM stating she would like a return call to see how the visit with Jakaiya went.  Please advise.

## 2021-11-22 NOTE — Assessment & Plan Note (Signed)
The patient is developed at least stage II lymphedema from chronic scarring and lymphatic channels with her chronic swelling, previous history of DVT, and venous insufficiency.  She has skin changes with stasis dermatitis changes and skin thickening which is present.  No open wounds or ulceration currently.  She has undergone laser ablation of the left great saphenous vein but the swelling is persistent.  I believe she would benefit from a lymphedema pump as an adjuvant therapy to try to improve her swelling.  We will try to get that approved and see her back in several months.

## 2021-11-22 NOTE — Telephone Encounter (Signed)
Left a message for patient mother to return call to office

## 2021-11-22 NOTE — Assessment & Plan Note (Signed)
Status post laser ablation of the left great saphenous vein which was successful without DVT.  Mild improvement in her swelling.  Swelling is persistent she clearly has a component of lymphedema from chronic scarring and lymphatic channels.  This to be at least stage II lymphedema try to get her lymphedema pump.  Her swelling is being refractory to compression socks, elevation, and improved only slightly after venous intervention even though she has been undergoing conservative management for many months now.

## 2021-11-22 NOTE — Progress Notes (Signed)
MRN : [id number]  [name] is a 51 y.o. (02-19-1971) female who presents with chief complaint of No chief complaint on file. Marland Kitchen  History of Present Illness: Patient returns today in follow up of her leg swelling and venous disease.  About a month ago, she underwent laser ablation of the left great saphenous vein.  She had a postprocedural duplex which showed a successful ablation without DVT.  She may have had a mild improvement in her swelling, but her swelling still pretty significant.  Compression socks and elevation for many months have not really helped.  She has a hard time getting the compression socks on and off at this point.  Current Outpatient Medications  Medication Sig Dispense Refill   acetaminophen (TYLENOL) 650 MG CR tablet Take 1,300 mg by mouth every 8 (eight) hours as needed for pain.     allopurinol (ZYLOPRIM) 100 MG tablet Take 1 tablet (100 mg total) by mouth daily. (Patient taking differently: Take 100 mg by mouth at bedtime.) 90 tablet 3   aspirin EC 81 MG tablet Take 1 tablet (81 mg total) by mouth daily. Swallow whole.     clobetasol cream (TEMOVATE) 0.05 % APPLY TO SKIN RASH 2 TIMES DAILY AS NEEDED. MAX 2 WEEKS AT A TIME, AVOID FACE, UNDERARMS, GROIN. 45 g 0   levETIRAcetam (KEPPRA) 500 MG tablet Take 1 tablet (500 mg total) by mouth 2 (two) times daily. (Patient taking differently: Take 500 mg by mouth 2 (two) times daily.) 180 tablet 3   levothyroxine (SYNTHROID) 125 MCG tablet TAKE 1 TABLET BY MOUTH EVERY DAY BEFORE BREAKFAST 90 tablet 1   memantine (NAMENDA) 10 MG tablet Take 1 tablet (10 mg total) by mouth 2 (two) times daily. (Patient taking differently: Take 10 mg by mouth 2 (two) times daily.) 180 tablet 3   Multiple Vitamins-Minerals (WOMENS MULTI PO) Take 1 tablet by mouth at bedtime.     silver sulfADIAZINE (SILVADENE) 1 % cream Apply 1 application. topically daily. (Patient taking differently: Apply 1 application  topically daily as needed.) 50 g 0    sertraline (ZOLOFT) 100 MG tablet Take 1 tablet (100 mg total) by mouth at bedtime. 30 tablet 5   No current facility-administered medications for this visit.    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 [facility])    neurologist--- dr [name];   secondary to down syndrome (Trisomy 21-related),  nl CT head 2010, did not tolerate aricept   Down syndrome    Eczema    Edema of both lower extremities    Endometrial hyperplasia    H/O pleural empyema 09/24/2011   acute  in setting CAP /   s/p left  VATS with drainage empyema,  post op acute respiratory failure --- resolved   History of DVT of lower extremity 05/11/2021   dx left lower extremity popliteal vein,  completed 3 months eliquis then changed to ASA    History of recurrent pneumonia    multiple pneumonias   Hypothyroidism    MDD (major depressive disorder)    OA (osteoarthritis)    bilat knee arthritis, had 3/3 Supartz injections [name])   OCD (obsessive compulsive disorder)  OSA on CPAP 05/2012   folowed by dr Craige Cotta---- study in epic, severe OSA with AHI 118, desat nadir to 67%   (per pt mother pt uses nightly)   Pre-diabetes    Seizure disorder [facility]) 2013   neurologist--- dr [name];   (10-28-2021  per pt mother no seizure since 2013)    long hospitalization, x 2 ; "very mild"; during hospitalization "while coming off vent"   Varicose veins of both lower extremities    followed by dr j. Meya Clutter ---   with venous reflux,   10-25-2021  s/p laser procedure left leg GSV and SF   Wears glasses     Past Surgical History:  Procedure Laterality Date   ACHILLES TENDON REPAIR Right 01/2017   Dr. [facility]   ACHILLES TENDON REPAIR Right 01/2017   ANKLE  ARTHROSCOPY Right 11/11/2020   @[facility]  by dr ;    debridement and stablilzation lateral collateral ligament   ENDOSCOPIC VEIN LASER TREATMENT Left 10/25/2021   by dr j. Nayzeth Altman;   left leg GSV and SF for venous reflux   FLEXIBLE BRONCHOSCOPY  09/24/2011   Procedure: FLEXIBLE BRONCHOSCOPY;  Surgeon: 09/26/2011, MD;  Location: [facility] OR;  Service: Thoracic;  Laterality: N/A;   HYSTEROSCOPY WITH D & C N/A 09/27/2021   Procedure: DILATATION AND CURETTAGE /HYSTEROSCOPY;  Surgeon: 11/27/2021, MD;  Location: [facility] OR;  Service: Gynecology;  Laterality: N/A;   INTRAUTERINE DEVICE (IUD) INSERTION N/A 11/07/2021   Procedure: INTRAUTERINE DEVICE (IUD) INSERTION;  Surgeon: 01/07/2022, MD;  Location: [facility];  Service: Gynecology;  Laterality: N/A;   LUMBAR DISC SURGERY  07/18/2004   @[facility]  by dr 07/20/2004;   L4--5 disectomy and left L3--4 nerve decompression   TRACHEOSTOMY TUBE PLACEMENT  10/11/2011   Procedure: TRACHEOSTOMY;  Surgeon: Wynetta Emery, MD;  Location: [facility] OR;  Service: ENT;  Laterality: N/A;   VIDEO ASSISTED THORACOSCOPY (VATS) /THOROCOTOMY/RADIOACTIVE SEED IMPLANT Left 09/24/2011   VATS, (Dr[facility])     Social History   Tobacco Use   Smoking status: Never   Smokeless tobacco: Never  Vaping Use   Vaping Use: Never used  Substance Use Topics   Alcohol use: Never   Drug use: Never      Family History  Problem Relation Age of Onset   Arthritis Mother    Hypertension Father    Diabetes Father    Cancer Neg Hx    Coronary artery disease Neg Hx    Stroke Neg Hx      No Known Allergies   REVIEW OF SYSTEMS (Negative unless checked)   Constitutional: [] Weight loss  [] Fever  [] Chills Cardiac: [] Chest pain   [] Chest pressure   [] Palpitations   [] Shortness of breath when laying flat   [] Shortness of breath at rest   [] Shortness of breath with exertion. Vascular:  [x] Pain in legs with walking   [x] Pain in legs at rest   [] Pain in legs when laying flat    [] Claudication   [] Pain in feet when walking  [] Pain in feet at rest  [] Pain in feet when laying flat   [] History of DVT   [] Phlebitis   [x] Swelling in legs   [] Varicose veins   [] Non-healing ulcers Pulmonary:   [] Uses home oxygen   [] Productive cough   [] Hemoptysis   [] Wheeze  [] COPD   [] Asthma Neurologic:  [] Dizziness  [] Blackouts   [x] Seizures   [] History of stroke   [] History of TIA  [] Aphasia   []   Temporary blindness   [] Dysphagia   [] Weakness or numbness in arms   [] Weakness or numbness in legs Musculoskeletal:  [] Arthritis   [] Joint swelling   [] Joint pain   [] Low back pain Hematologic:  [] Easy bruising  [] Easy bleeding   [] Hypercoagulable state   [] Anemic  [] Hepatitis Gastrointestinal:  [] Blood in stool   [] Vomiting blood  [x] Gastroesophageal reflux/heartburn   [] Abdominal pain Genitourinary:  [] Chronic kidney disease   [] Difficult urination  [] Frequent urination  [] Burning with urination   [] Hematuria Skin:  [] Rashes   [] Ulcers   [] Wounds Psychological:  [] History of anxiety   [x]  History of major depression.   Physical Examination  BP 123/68 (BP Location: Left Arm)   Pulse 61   Resp 18   Ht 4\' 9"  (1.448 m)   Wt 243 lb (110.2 kg)   BMI 52.58 kg/m  Gen:  WD/WN, NAD.  Obese.  Typical Down's features. Head: [place]/AT, No temporalis wasting. Ear/Nose/Throat: Hearing grossly intact, nares w/o erythema or drainage Eyes: Conjunctiva clear. Sclera non-icteric Neck: Supple.  Trachea midline Pulmonary:  Good air movement, no use of accessory muscles.  Cardiac: RRR, no JVD Vascular:  Vessel Right Left  Radial Palpable Palpable               Musculoskeletal: M/S 5/5 throughout.  No deformity or atrophy.  1-2+ right lower extremity edema, 2+ left lower extremity edema. Moderate stasis dermatitis changes present in both legs Neurologic: Sensation grossly intact in extremities.  Symmetrical.  Speech is fluent.  Psychiatric: Judgment intact, Mood & affect appropriate for pt's clinical  situation. Dermatologic: No rashes or ulcers noted.  No cellulitis or open wounds.      Labs Recent Results (from the past 2160 hour(s))  Pregnancy, urine POC     Status: None   Collection Time: 09/27/21 12:37 PM  Result Value Ref Range   Preg Test, Ur NEGATIVE NEGATIVE    Comment:        THE SENSITIVITY OF THIS METHODOLOGY IS >24 mIU/mL   CBC     Status: Abnormal   Collection Time: 09/27/21  1:06 PM  Result Value Ref Range   WBC 4.9 4.0 - 10.5 K/uL   RBC 3.98 3.87 - 5.11 MIL/uL   Hemoglobin 13.6 12.0 - 15.0 g/dL   HCT 16.140.6 09.636.0 - 04.546.0 %   MCV 102.0 (H) 80.0 - 100.0 fL   MCH 34.2 (H) 26.0 - 34.0 pg   MCHC 33.5 30.0 - 36.0 g/dL   RDW 40.914.2 81.111.5 - 91.415.5 %   Platelets 220 150 - 400 K/uL   nRBC 0.0 0.0 - 0.2 %    Comment: Performed at [facility], [address] [place], [place] [address]  Cytology - PAP     Status: None   Collection Time: 09/27/21  3:33 PM  Result Value Ref Range   High risk HPV Negative    Adequacy      Satisfactory for evaluation; transformation zone component ABSENT.   Diagnosis      - Negative for intraepithelial lesion or malignancy (NILM)   Comment Normal Reference Range HPV - Negative   Surgical pathology     Status: None   Collection Time: 09/27/21  3:36 PM  Result Value Ref Range   SURGICAL PATHOLOGY      SURGICAL PATHOLOGY CASE: [id number] PATIENT: Emogene Lohse Surgical Pathology Report     Clinical History: PMB, thickened endometrium (cm)     FINAL MICROSCOPIC DIAGNOSIS:  A. ENDOMETRIUM, CURETTAGE: - Endometrium  showing simple hyperplasia without atypia, see comment     COMMENT:  Dr. Luisa Hart reviewed the case and concurs with the diagnosis.     GROSS DESCRIPTION:  The specimen is received fresh and consists of a 2.5 x 1.8 x 0.3 cm aggregate of red-brown soft tissue and clotted blood.  The specimen is entirely submitted in 1 cassette.  Lovey Newcomer 09/28/2021)    Final Diagnosis performed by Holley Bouche, MD.    Electronically signed 09/30/2021 Technical component performed at [facility]. [facility], [address] [place], [place] [address].  Professional component performed at [facility], [address] [place], [place] [address].  Immunohistochemistry Technical component (if applicable) was performed at [facility]. [address], [place], [place] [address].   IMMUNOHISTOCHEMISTRY DISCLAIMER (if applicable): Some of these immunohistochemical stains may have been developed and the performance characteristics determine by [facility]. Some may not have been cleared or approved by the U.S. Food and Drug Administration. The FDA has determined that such clearance or approval is not necessary. This test is used for clinical purposes. It should not be regarded as investigational or for research. This laboratory is certified under the Clinical Laboratory Improvement Amendments of 1988 (CLIA-88) as qualified to perform high complexity clinical laboratory testing.  The controls stained appropriately.   CBC     Status: Abnormal   Collection Time: 11/07/21  8:00 AM  Result Value Ref Range   WBC 4.8 4.0 - 10.5 K/uL   RBC 4.05 3.87 - 5.11 MIL/uL   Hemoglobin 14.1 12.0 - 15.0 g/dL   HCT 27.7 41.2 - 87.8 %   MCV 108.6 (H) 80.0 - 100.0 fL   MCH 34.8 (H) 26.0 - 34.0 pg   MCHC 32.0 30.0 - 36.0 g/dL   RDW 67.6 72.0 - 94.7 %   Platelets 169 150 - 400 K/uL   nRBC 0.0 0.0 - 0.2 %    Comment: Performed at [facility], [address] [place], [place] [address]  Pregnancy, urine POC     Status: None   Collection Time: 11/07/21 10:31 AM  Result Value Ref Range   Preg Test, Ur NEGATIVE NEGATIVE    Comment:        THE SENSITIVITY OF THIS METHODOLOGY IS >24 mIU/mL     Radiology VAS [place] LOWER EXTREMITY VENOUS POST ABLATION  Result Date: 11/04/2021  Lower Venous Reflux Study Patient Name:  DEJANAY WAMBOLDT  Date of Exam:    11/01/2021 Medical Rec #: [id number]      Accession #:    [id number] Date of Birth: [birth year]      Patient Gender: F Patient Age:   39 years Exam Location:  [facility] Procedure:      VAS [place] LOWER EXTREMITY VENOUS POST ABLATION Referring Phys: Barbara Cower Shloma Roggenkamp --------------------------------------------------------------------------------  Indications: Post Lt GSV Ablation Imaging.  Performing Technologist: Debbe Bales RVS  Examination Guidelines: A complete evaluation includes B-mode imaging, spectral Doppler, color Doppler, and power Doppler as needed of all accessible portions of each vessel. Bilateral testing is considered an integral part of a complete examination. Limited examinations for reoccurring indications may be performed as noted. The reflux portion of the exam is performed with the patient in reverse Trendelenburg. Significant venous reflux is defined as >500 ms in the superficial venous system, and >1 second in the deep venous system.  +--------------+--------+------+----------+------------+-----------------------+ LEFT          Reflux  Reflux  Reflux  Diameter cmsComments                              No       Yes     Time                                       +--------------+--------+------+----------+------------+-----------------------+ CFV           no                                                          +--------------+--------+------+----------+------------+-----------------------+ FV prox       no                                                          +--------------+--------+------+----------+------------+-----------------------+ FV mid        no                                                          +--------------+--------+------+----------+------------+-----------------------+ FV dist       no                                                          +--------------+--------+------+----------+------------+-----------------------+  Popliteal     no                                                          +--------------+--------+------+----------+------------+-----------------------+ GSV at SFJ    no                                                          +--------------+--------+------+----------+------------+-----------------------+ GSV prox thighno                                  prior                                                                     ablation/stripping      +--------------+--------+------+----------+------------+-----------------------+  GSV mid thigh no                                  prior                                                                     ablation/stripping      +--------------+--------+------+----------+------------+-----------------------+ GSV dist thighno                                  prior                                                                     ablation/stripping      +--------------+--------+------+----------+------------+-----------------------+ GSV at knee   no                                  prior                                                                     ablation/stripping      +--------------+--------+------+----------+------------+-----------------------+   Summary: Left: - No evidence of deep vein thrombosis seen in the left lower extremity, from the common femoral through the popliteal veins. - There appears to be no flow in the Left GSV from Knee level to approximately 1 cm to the SFJ via Ablation.  *See table(s) above for measurements and observations. Electronically signed by Leotis Pain MD on 11/04/2021 at 8:55:07 AM.    Final     Assessment/Plan Prediabetes blood glucose control important in reducing the progression of atherosclerotic disease. Also, involved in wound healing. On appropriate medications.     Down syndrome This certainly limits her functional status somewhat, although  she has been very functional and survived to the age of 60 thus far.  Her mobility and activity are less than ideal.   Morbid obesity with BMI of 50.0-59.9, adult ([place]) Worsens LE swelling.  Weight loss would be of benefit.  Lymphedema The patient is developed at least stage II lymphedema from chronic scarring and lymphatic channels with her chronic swelling, previous history of DVT, and venous insufficiency.  She has skin changes with stasis dermatitis changes and skin thickening which is present.  No open wounds or ulceration currently.  She has undergone laser ablation of the left great saphenous vein but the swelling is persistent.  I believe she would benefit from a lymphedema pump as an adjuvant therapy to try to improve her swelling.  We will try to get that approved and see her back in several months.  Varicose veins of  leg with swelling, bilateral Status post laser ablation of the left great saphenous vein which was successful without DVT.  Mild improvement in her swelling.  Swelling is persistent she clearly has a component of lymphedema from chronic scarring and lymphatic channels.  This to be at least stage II lymphedema try to get her lymphedema pump.  Her swelling is being refractory to compression socks, elevation, and improved only slightly after venous intervention even though she has been undergoing conservative management for many months now.    Festus Barren, MD  11/22/2021 9:46 AM    This note was created with Dragon medical transcription system.  Any errors from dictation are purely unintentional

## 2021-11-24 NOTE — Telephone Encounter (Signed)
Patient mother was made aware with medical recommendations from last office visit and would like to move forward with schedule follow up appointment.

## 2021-12-08 ENCOUNTER — Other Ambulatory Visit: Payer: Self-pay | Admitting: Adult Health

## 2021-12-09 ENCOUNTER — Other Ambulatory Visit (INDEPENDENT_AMBULATORY_CARE_PROVIDER_SITE_OTHER): Payer: Self-pay | Admitting: Nurse Practitioner

## 2021-12-14 DIAGNOSIS — M1712 Unilateral primary osteoarthritis, left knee: Secondary | ICD-10-CM | POA: Diagnosis not present

## 2021-12-14 DIAGNOSIS — M25562 Pain in left knee: Secondary | ICD-10-CM | POA: Diagnosis not present

## 2021-12-21 DIAGNOSIS — M25562 Pain in left knee: Secondary | ICD-10-CM | POA: Diagnosis not present

## 2021-12-21 DIAGNOSIS — M1712 Unilateral primary osteoarthritis, left knee: Secondary | ICD-10-CM | POA: Diagnosis not present

## 2021-12-26 ENCOUNTER — Encounter (INDEPENDENT_AMBULATORY_CARE_PROVIDER_SITE_OTHER): Payer: Self-pay

## 2021-12-28 ENCOUNTER — Telehealth: Payer: Self-pay

## 2021-12-28 DIAGNOSIS — M1712 Unilateral primary osteoarthritis, left knee: Secondary | ICD-10-CM | POA: Diagnosis not present

## 2021-12-28 DIAGNOSIS — M25562 Pain in left knee: Secondary | ICD-10-CM | POA: Diagnosis not present

## 2021-12-28 NOTE — Telephone Encounter (Signed)
Pt scheduled for cpe labs on 01/10/22 at 7:45.   Pt's mom, Wells Guiles (on dpr), requests ANA labs be drawn in addition to normal cpe labs.

## 2021-12-28 NOTE — Telephone Encounter (Signed)
Spoke with pt's mom, Wells Guiles (on dpr), asking for reason for ANA lab request for pt.  States pt c/o same leg pain that she [mom] has, making it hard to walk and disturbs sleep.  States pt has had vein surgery in L 08/2021. But surgeon states pt should not have the pain she c/o.

## 2021-12-28 NOTE — Telephone Encounter (Signed)
What symptoms is she having that mom is concerned about?

## 2022-01-04 DIAGNOSIS — M1711 Unilateral primary osteoarthritis, right knee: Secondary | ICD-10-CM | POA: Diagnosis not present

## 2022-01-04 DIAGNOSIS — M1712 Unilateral primary osteoarthritis, left knee: Secondary | ICD-10-CM | POA: Diagnosis not present

## 2022-01-08 ENCOUNTER — Other Ambulatory Visit: Payer: Self-pay | Admitting: Family Medicine

## 2022-01-08 DIAGNOSIS — M1A0620 Idiopathic chronic gout, left knee, without tophus (tophi): Secondary | ICD-10-CM

## 2022-01-08 DIAGNOSIS — D7589 Other specified diseases of blood and blood-forming organs: Secondary | ICD-10-CM

## 2022-01-08 DIAGNOSIS — M7989 Other specified soft tissue disorders: Secondary | ICD-10-CM

## 2022-01-08 DIAGNOSIS — E039 Hypothyroidism, unspecified: Secondary | ICD-10-CM

## 2022-01-08 DIAGNOSIS — R7303 Prediabetes: Secondary | ICD-10-CM

## 2022-01-10 ENCOUNTER — Other Ambulatory Visit: Payer: Medicare PPO

## 2022-01-10 ENCOUNTER — Other Ambulatory Visit (INDEPENDENT_AMBULATORY_CARE_PROVIDER_SITE_OTHER): Payer: Medicare PPO

## 2022-01-10 DIAGNOSIS — M7989 Other specified soft tissue disorders: Secondary | ICD-10-CM | POA: Diagnosis not present

## 2022-01-10 DIAGNOSIS — M1A0620 Idiopathic chronic gout, left knee, without tophus (tophi): Secondary | ICD-10-CM | POA: Diagnosis not present

## 2022-01-10 DIAGNOSIS — R7303 Prediabetes: Secondary | ICD-10-CM

## 2022-01-10 DIAGNOSIS — E039 Hypothyroidism, unspecified: Secondary | ICD-10-CM

## 2022-01-10 DIAGNOSIS — D7589 Other specified diseases of blood and blood-forming organs: Secondary | ICD-10-CM | POA: Diagnosis not present

## 2022-01-10 LAB — COMPREHENSIVE METABOLIC PANEL
ALT: 17 U/L (ref 0–35)
AST: 18 U/L (ref 0–37)
Albumin: 4.1 g/dL (ref 3.5–5.2)
Alkaline Phosphatase: 50 U/L (ref 39–117)
BUN: 21 mg/dL (ref 6–23)
CO2: 31 mEq/L (ref 19–32)
Calcium: 8.9 mg/dL (ref 8.4–10.5)
Chloride: 102 mEq/L (ref 96–112)
Creatinine, Ser: 0.97 mg/dL (ref 0.40–1.20)
GFR: 67.74 mL/min (ref >60.00)
Glucose, Bld: 98 mg/dL (ref 70–99)
Potassium: 4.4 mEq/L (ref 3.5–5.1)
Sodium: 138 mEq/L (ref 135–145)
Total Bilirubin: 0.2 mg/dL (ref 0.2–1.2)
Total Protein: 7 g/dL (ref 6.0–8.3)

## 2022-01-10 LAB — HEMOGLOBIN A1C: Hgb A1c MFr Bld: 6 % (ref 4.6–6.5)

## 2022-01-10 LAB — CBC WITH DIFFERENTIAL/PLATELET
Basophils Absolute: 0.1 10*3/uL (ref 0.0–0.1)
Basophils Relative: 2.5 % (ref 0.0–3.0)
Eosinophils Absolute: 0.1 10*3/uL (ref 0.0–0.7)
Eosinophils Relative: 2.1 % (ref 0.0–5.0)
HCT: 41.5 % (ref 36.0–46.0)
Hemoglobin: 13.7 g/dL (ref 12.0–15.0)
Lymphocytes Relative: 22.6 % (ref 12.0–46.0)
Lymphs Abs: 1 10*3/uL (ref 0.7–4.0)
MCHC: 33.1 g/dL (ref 30.0–36.0)
MCV: 105.4 fl — ABNORMAL HIGH (ref 78.0–100.0)
Monocytes Absolute: 0.4 10*3/uL (ref 0.1–1.0)
Monocytes Relative: 8.3 % (ref 3.0–12.0)
Neutro Abs: 2.8 10*3/uL (ref 1.4–7.7)
Neutrophils Relative %: 64.5 % (ref 43.0–77.0)
Platelets: 200 10*3/uL (ref 150.0–400.0)
RBC: 3.94 Mil/uL (ref 3.87–5.11)
RDW: 15.2 % (ref 11.5–15.5)
WBC: 4.3 10*3/uL (ref 4.0–10.5)

## 2022-01-10 LAB — URIC ACID: Uric Acid, Serum: 5.3 mg/dL (ref 2.4–7.0)

## 2022-01-10 LAB — VITAMIN B12: Vitamin B-12: 494 pg/mL (ref 211–911)

## 2022-01-10 LAB — TSH: TSH: 4.77 u[IU]/mL (ref 0.35–5.50)

## 2022-01-10 LAB — SEDIMENTATION RATE: Sed Rate: 43 mm/hr — ABNORMAL HIGH (ref 0–30)

## 2022-01-11 LAB — ANA: Anti Nuclear Antibody (ANA): NEGATIVE

## 2022-01-17 ENCOUNTER — Ambulatory Visit (INDEPENDENT_AMBULATORY_CARE_PROVIDER_SITE_OTHER): Payer: Medicare PPO | Admitting: Family Medicine

## 2022-01-17 ENCOUNTER — Encounter: Payer: Self-pay | Admitting: Family Medicine

## 2022-01-17 VITALS — BP 102/64 | HR 57 | Temp 97.7°F | Ht <= 58 in | Wt 239.8 lb

## 2022-01-17 DIAGNOSIS — Z86718 Personal history of other venous thrombosis and embolism: Secondary | ICD-10-CM | POA: Diagnosis not present

## 2022-01-17 DIAGNOSIS — F0390 Unspecified dementia without behavioral disturbance: Secondary | ICD-10-CM

## 2022-01-17 DIAGNOSIS — Z1211 Encounter for screening for malignant neoplasm of colon: Secondary | ICD-10-CM

## 2022-01-17 DIAGNOSIS — G40209 Localization-related (focal) (partial) symptomatic epilepsy and epileptic syndromes with complex partial seizures, not intractable, without status epilepticus: Secondary | ICD-10-CM

## 2022-01-17 DIAGNOSIS — D7589 Other specified diseases of blood and blood-forming organs: Secondary | ICD-10-CM

## 2022-01-17 DIAGNOSIS — Z0000 Encounter for general adult medical examination without abnormal findings: Secondary | ICD-10-CM

## 2022-01-17 DIAGNOSIS — Z7189 Other specified counseling: Secondary | ICD-10-CM | POA: Diagnosis not present

## 2022-01-17 DIAGNOSIS — Z6841 Body Mass Index (BMI) 40.0 and over, adult: Secondary | ICD-10-CM

## 2022-01-17 DIAGNOSIS — F3342 Major depressive disorder, recurrent, in full remission: Secondary | ICD-10-CM

## 2022-01-17 DIAGNOSIS — R051 Acute cough: Secondary | ICD-10-CM

## 2022-01-17 DIAGNOSIS — E039 Hypothyroidism, unspecified: Secondary | ICD-10-CM

## 2022-01-17 DIAGNOSIS — M1712 Unilateral primary osteoarthritis, left knee: Secondary | ICD-10-CM

## 2022-01-17 DIAGNOSIS — Z23 Encounter for immunization: Secondary | ICD-10-CM

## 2022-01-17 DIAGNOSIS — N8501 Benign endometrial hyperplasia: Secondary | ICD-10-CM

## 2022-01-17 DIAGNOSIS — R7303 Prediabetes: Secondary | ICD-10-CM

## 2022-01-17 DIAGNOSIS — I83893 Varicose veins of bilateral lower extremities with other complications: Secondary | ICD-10-CM

## 2022-01-17 DIAGNOSIS — M1A0620 Idiopathic chronic gout, left knee, without tophus (tophi): Secondary | ICD-10-CM | POA: Diagnosis not present

## 2022-01-17 DIAGNOSIS — I890 Lymphedema, not elsewhere classified: Secondary | ICD-10-CM

## 2022-01-17 DIAGNOSIS — Q909 Down syndrome, unspecified: Secondary | ICD-10-CM

## 2022-01-17 MED ORDER — LEVOTHYROXINE SODIUM 125 MCG PO TABS: 125.0000 ug | ORAL_TABLET | Freq: Every day | ORAL | 3 refills | Status: DC

## 2022-01-17 MED ORDER — TRAMADOL HCL 50 MG PO TABS: 50.0000 mg | ORAL_TABLET | Freq: Two times a day (BID) | ORAL | 0 refills | Status: DC | PRN

## 2022-01-17 MED ORDER — ALLOPURINOL 100 MG PO TABS: 100.0000 mg | ORAL_TABLET | Freq: Every day | ORAL | 3 refills | Status: DC

## 2022-01-17 MED ORDER — IBUPROFEN 200 MG PO CAPS: 600.0000 mg | ORAL_CAPSULE | Freq: Two times a day (BID) | ORAL | Status: AC | PRN

## 2022-01-17 NOTE — Patient Instructions (Addendum)
If interested, check with pharmacy about new 2 shot shingles series (shingrix).  Flu shot today  Tramadol refilled. Good to see you today Return as needed or in 1 year for next physical.   Health Maintenance, Female Adopting a healthy lifestyle and getting preventive care are important in promoting health and wellness. Ask your health care provider about: The right schedule for you to have regular tests and exams. Things you can do on your own to prevent diseases and keep yourself healthy. What should I know about diet, weight, and exercise? Eat a healthy diet  Eat a diet that includes plenty of vegetables, fruits, low-fat dairy products, and lean protein. Do not eat a lot of foods that are high in solid fats, added sugars, or sodium. Maintain a healthy weight Body mass index (BMI) is used to identify weight problems. It estimates body fat based on height and weight. Your health care provider can help determine your BMI and help you achieve or maintain a healthy weight. Get regular exercise Get regular exercise. This is one of the most important things you can do for your health. Most adults should: Exercise for at least 150 minutes each week. The exercise should increase your heart rate and make you sweat (moderate-intensity exercise). Do strengthening exercises at least twice a week. This is in addition to the moderate-intensity exercise. Spend less time sitting. Even light physical activity can be beneficial. Watch cholesterol and blood lipids Have your blood tested for lipids and cholesterol at 51 years of age, then have this test every 5 years. Have your cholesterol levels checked more often if: Your lipid or cholesterol levels are high. You are older than 51 years of age. You are at high risk for heart disease. What should I know about cancer screening? Depending on your health history and family history, you may need to have cancer screening at various ages. This may include  screening for: Breast cancer. Cervical cancer. Colorectal cancer. Skin cancer. Lung cancer. What should I know about heart disease, diabetes, and high blood pressure? Blood pressure and heart disease High blood pressure causes heart disease and increases the risk of stroke. This is more likely to develop in people who have high blood pressure readings or are overweight. Have your blood pressure checked: Every 3-5 years if you are 71-13 years of age. Every year if you are 45 years old or older. Diabetes Have regular diabetes screenings. This checks your fasting blood sugar level. Have the screening done: Once every three years after age 83 if you are at a normal weight and have a low risk for diabetes. More often and at a younger age if you are overweight or have a high risk for diabetes. What should I know about preventing infection? Hepatitis B If you have a higher risk for hepatitis B, you should be screened for this virus. Talk with your health care provider to find out if you are at risk for hepatitis B infection. Hepatitis C Testing is recommended for: Everyone born from [birth year] through 1965. Anyone with known risk factors for hepatitis C. Sexually transmitted infections (STIs) Get screened for STIs, including gonorrhea and chlamydia, if: You are sexually active and are younger than 50 years of age. You are older than 51 years of age and your health care provider tells you that you are at risk for this type of infection. Your sexual activity has changed since you were last screened, and you are at increased risk for chlamydia or gonorrhea. Ask  your health care provider if you are at risk. Ask your health care provider about whether you are at high risk for HIV. Your health care provider may recommend a prescription medicine to help prevent HIV infection. If you choose to take medicine to prevent HIV, you should first get tested for HIV. You should then be tested every 3 months for as  long as you are taking the medicine. Pregnancy If you are about to stop having your period (premenopausal) and you may become pregnant, seek counseling before you get pregnant. Take 400 to 800 micrograms (mcg) of folic acid every day if you become pregnant. Ask for birth control (contraception) if you want to prevent pregnancy. Osteoporosis and menopause Osteoporosis is a disease in which the bones lose minerals and strength with aging. This can result in bone fractures. If you are 51 years old or older, or if you are at risk for osteoporosis and fractures, ask your health care provider if you should: Be screened for bone loss. Take a calcium or vitamin D supplement to lower your risk of fractures. Be given hormone replacement therapy (HRT) to treat symptoms of menopause. Follow these instructions at home: Alcohol use Do not drink alcohol if: Your health care provider tells you not to drink. You are pregnant, may be pregnant, or are planning to become pregnant. If you drink alcohol: Limit how much you have to: 0-1 drink a day. Know how much alcohol is in your drink. In the [place] one drink equals one 12 oz bottle of beer (355 mL), one 5 oz glass of wine (148 mL), or one 1 oz glass of hard liquor (44 mL). Lifestyle Do not use any products that contain nicotine or tobacco. These products include cigarettes, chewing tobacco, and vaping devices, such as e-cigarettes. If you need help quitting, ask your health care provider. Do not use street drugs. Do not share needles. Ask your health care provider for help if you need support or information about quitting drugs. General instructions Schedule regular health, dental, and eye exams. Stay current with your vaccines. Tell your health care provider if: You often feel depressed. You have ever been abused or do not feel safe at home. Summary Adopting a healthy lifestyle and getting preventive care are important in promoting health and  wellness. Follow your health care provider's instructions about healthy diet, exercising, and getting tested or screened for diseases. Follow your health care provider's instructions on monitoring your cholesterol and blood pressure. This information is not intended to replace advice given to you by your health care provider. Make sure you discuss any questions you have with your health care provider. Document Revised: 07/05/2020 Document Reviewed: 07/05/2020 Elsevier Patient Education  2023 [employer].

## 2022-01-17 NOTE — Progress Notes (Signed)
Patient ID: [name], female    DOB: [birth year], 51 y.o.   MRN: [id number]  This visit was conducted in person.  BP 102/64   Pulse (!) 57   Temp 97.7 F (36.5 C) (Tympanic)   Ht 4\' 8"  (1.422 m)   Wt 239 lb 12.8 oz (108.8 kg)   SpO2 99%   BMI 53.76 kg/m    CC: CPE Subjective:   HPI: [name] is a 51 y.o. female presenting on 01/17/2022 for Annual Exam   Saw health advisor 09/2021 for medicare wellness visit. Note reviewed.    No results found.  Flowsheet Row Clinical Support from 10/20/2021 in [facility] [place]  PHQ-2 Total Score 0          10/20/2021   11:40 AM 10/18/2020    2:18 PM 07/31/2018   10:25 AM 08/08/2017    8:15 AM 09/08/2016    3:20 PM  Fall Risk   Falls in the past year? 0 0 0 No No  Number falls in past yr: 0 0     Injury with Fall? 0 0     Risk for fall due to : No Fall Risks      Follow up Falls evaluation completed        [name] woke up this morning with cough, sore throat. No congestion, rhinorrhea, fever.   Chronic leg swelling with lymphedema followed by [facility] s/p laser ablation of L great saphenous vein 09/2021. Difficulty getting on compression stockings. H/o DVT 04/2021 treated with eliquis. To start using lymphedema pumps.   Episode of vaginal bleeding earlier this year after eliquis - saw GYN s/p EMB showing endometrial hyperplasia without atypia, treating with progesterone IUD with yearly sampling under anesthesia.   Knee osteoarthritis - just had knee injection.   Preventative: Colon cancer screening - discussed. Declines colonoscopy. Will order Cologuard.  Well woman - with OBGYN exam under anesthesia - normal pap (absent transformation zone)  LMP irregular - end of July  Mammogram 07/2019 [facility] - encouraged they call to schedule Lung cancer screening - not eligible  Flu shot - yearly  COVID vaccine - Pfizer x2 05/2019, booster x1 Pneumovax 2013.  Tdap - 01/2013.  Shingrix - discussed.  Advanced directives:  mom would be HCPOA but this has not been set up yet - encouraged they work on this.  Seat belt use discussed.  Sunscreen use discussed. No changing moles.  Non smoker  Alcohol - none  Dentist - due Eye exam Q6 mo - retinologist Dr 02/2013  Bowel - no constipation  Bladder - no incontinence  No memory difficulty Mood - sertraline 100mg  stopped due to worsening mood difficulty.  Hearing well    Lives with mom and dad, dogs Stays at home during the day, no longer goes to adult daycare Activity: none - activity limited by pain Diet: some water, likes coke zero, fruits/vegetables daily - encouraged limited sugar/carbs     Relevant past medical, surgical, family and social history reviewed and updated as indicated. Interim medical history since our last visit reviewed. Allergies and medications reviewed and updated. Outpatient Medications Prior to Visit  Medication Sig Dispense Refill   acetaminophen (TYLENOL) 650 MG CR tablet Take 1,300 mg by mouth every 8 (eight) hours as needed for pain.     aspirin EC 81 MG tablet Take 1 tablet (81 mg total) by mouth daily. Swallow whole.     clobetasol cream (TEMOVATE) 0.05 % APPLY TO  SKIN RASH 2 TIMES DAILY AS NEEDED. MAX 2 WEEKS AT A TIME, AVOID FACE, UNDERARMS, GROIN. 45 g 0   levETIRAcetam (KEPPRA) 500 MG tablet Take 1 tablet (500 mg total) by mouth 2 (two) times daily. (Patient taking differently: Take 500 mg by mouth 2 (two) times daily.) 180 tablet 3   memantine (NAMENDA) 10 MG tablet Take 1 tablet (10 mg total) by mouth 2 (two) times daily. (Patient taking differently: Take 10 mg by mouth 2 (two) times daily.) 180 tablet 3   Multiple Vitamins-Minerals (WOMENS MULTI PO) Take 1 tablet by mouth at bedtime.     silver sulfADIAZINE (SILVADENE) 1 % cream Apply 1 application. topically daily. (Patient taking differently: Apply 1 application  topically daily as needed.) 50 g 0   allopurinol (ZYLOPRIM) 100 MG tablet Take 1 tablet (100 mg total) by mouth  daily. (Patient taking differently: Take 100 mg by mouth at bedtime.) 90 tablet 3   levothyroxine (SYNTHROID) 125 MCG tablet TAKE 1 TABLET BY MOUTH EVERY DAY BEFORE BREAKFAST 90 tablet 1   sertraline (ZOLOFT) 100 MG tablet TAKE 1 TABLET BY MOUTH EVERYDAY AT BEDTIME 90 tablet 2   No facility-administered medications prior to visit.     Per HPI unless specifically indicated in ROS section below Review of Systems  Constitutional:  Negative for activity change, appetite change, chills, fatigue, fever and unexpected weight change.  HENT:  Negative for hearing loss.   Eyes:  Negative for visual disturbance.  Respiratory:  Negative for cough, chest tightness, shortness of breath and wheezing.   Cardiovascular:  Negative for chest pain, palpitations and leg swelling.  Gastrointestinal:  Negative for abdominal distention, abdominal pain, blood in stool, constipation, diarrhea, nausea and vomiting.  Genitourinary:  Negative for difficulty urinating and hematuria.  Musculoskeletal:  Negative for arthralgias, myalgias and neck pain.  Skin:  Negative for rash.  Neurological:  Negative for dizziness, seizures, syncope and headaches.  Hematological:  Negative for adenopathy. Does not bruise/bleed easily.  Psychiatric/Behavioral:  Negative for dysphoric mood. The patient is not nervous/anxious.     Objective:  BP 102/64   Pulse (!) 57   Temp 97.7 F (36.5 C) (Tympanic)   Ht 4\' 8"  (1.422 m)   Wt 239 lb 12.8 oz (108.8 kg)   SpO2 99%   BMI 53.76 kg/m   Wt Readings from Last 3 Encounters:  01/17/22 239 lb 12.8 oz (108.8 kg)  11/22/21 243 lb (110.2 kg)  11/07/21 239 lb 12.8 oz (108.8 kg)      Physical Exam Vitals and nursing note reviewed.  Constitutional:      Appearance: Normal appearance. She is not ill-appearing.  HENT:     Head: Normocephalic and atraumatic.     Right Ear: Tympanic membrane, ear canal and external ear normal. There is no impacted cerumen.     Left Ear: Tympanic  membrane, ear canal and external ear normal. There is no impacted cerumen.     Mouth/Throat:     Comments: Wearing mask  Eyes:     General:        Right eye: No discharge.        Left eye: No discharge.     Extraocular Movements: Extraocular movements intact.     Conjunctiva/sclera: Conjunctivae normal.     Pupils: Pupils are equal, round, and reactive to light.  Neck:     Thyroid: No thyroid mass or thyromegaly.  Cardiovascular:     Rate and Rhythm: Normal rate and regular rhythm.  Pulses: Normal pulses.     Heart sounds: Normal heart sounds. No murmur heard. Pulmonary:     Effort: Pulmonary effort is normal. No respiratory distress.     Breath sounds: Normal breath sounds. No wheezing, rhonchi or rales.  Abdominal:     General: Bowel sounds are normal. There is no distension.     Palpations: Abdomen is soft. There is no mass.     Tenderness: There is no abdominal tenderness. There is no guarding or rebound.     Hernia: No hernia is present.  Musculoskeletal:     Cervical back: Normal range of motion and neck supple. No rigidity.     Right lower leg: No edema.     Left lower leg: No edema.  Lymphadenopathy:     Cervical: No cervical adenopathy.  Skin:    General: Skin is warm and dry.     Findings: No rash.  Neurological:     General: No focal deficit present.     Mental Status: She is alert. Mental status is at baseline.  Psychiatric:        Mood and Affect: Mood normal.        Behavior: Behavior normal.       Results for orders placed or performed in visit on 01/10/22  ANA  Result Value Ref Range   Anti Nuclear Antibody (ANA) NEGATIVE NEGATIVE  Sedimentation rate  Result Value Ref Range   Sed Rate 43 (H) 0 - 30 mm/hr  Comprehensive metabolic panel  Result Value Ref Range   Sodium 138 135 - 145 mEq/L   Potassium 4.4 3.5 - 5.1 mEq/L   Chloride 102 96 - 112 mEq/L   CO2 31 19 - 32 mEq/L   Glucose, Bld 98 70 - 99 mg/dL   BUN 21 6 - 23 mg/dL   Creatinine, Ser  4.09 0.40 - 1.20 mg/dL   Total Bilirubin 0.2 0.2 - 1.2 mg/dL   Alkaline Phosphatase 50 39 - 117 U/L   AST 18 0 - 37 U/L   ALT 17 0 - 35 U/L   Total Protein 7.0 6.0 - 8.3 g/dL   Albumin 4.1 3.5 - 5.2 g/dL   GFR 81.19 >14.78 mL/min   Calcium 8.9 8.4 - 10.5 mg/dL  Vitamin G95  Result Value Ref Range   Vitamin B-12 494 211 - 911 pg/mL  CBC with Differential/Platelet  Result Value Ref Range   WBC 4.3 4.0 - 10.5 K/uL   RBC 3.94 3.87 - 5.11 Mil/uL   Hemoglobin 13.7 12.0 - 15.0 g/dL   HCT 62.1 30.8 - 65.7 %   MCV 105.4 (H) 78.0 - 100.0 fl   MCHC 33.1 30.0 - 36.0 g/dL   RDW 84.6 96.2 - 95.2 %   Platelets 200.0 150.0 - 400.0 K/uL   Neutrophils Relative % 64.5 43.0 - 77.0 %   Lymphocytes Relative 22.6 12.0 - 46.0 %   Monocytes Relative 8.3 3.0 - 12.0 %   Eosinophils Relative 2.1 0.0 - 5.0 %   Basophils Relative 2.5 0.0 - 3.0 %   Neutro Abs 2.8 1.4 - 7.7 K/uL   Lymphs Abs 1.0 0.7 - 4.0 K/uL   Monocytes Absolute 0.4 0.1 - 1.0 K/uL   Eosinophils Absolute 0.1 0.0 - 0.7 K/uL   Basophils Absolute 0.1 0.0 - 0.1 K/uL  TSH  Result Value Ref Range   TSH 4.77 0.35 - 5.50 uIU/mL  Hemoglobin A1c  Result Value Ref Range   Hgb A1c MFr Bld 6.0  4.6 - 6.5 %  Uric acid  Result Value Ref Range   Uric Acid, Serum 5.3 2.4 - 7.0 mg/dL   Lab Results  Component Value Date   CHOL 145 10/11/2020   HDL 56.60 10/11/2020   LDLCALC 76 10/11/2020   TRIG 60.0 10/11/2020   CHOLHDL 3 10/11/2020    Assessment & Plan:   Problem List Items Addressed This Visit       Unprioritized   Health maintenance examination - Primary (Chronic)    Preventative protocols reviewed and updated unless pt declined. Discussed healthy diet and lifestyle.  Cologuard ordered.  Has established with GYN for well woman.       Advanced care planning/counseling discussion (Chronic)    Advanced directives: mom would be HCPOA but this has not been set up yet - encouraged they work on this.       Hypothyroidism    TSH stable  on levothyroxine daily.       Relevant Medications   levothyroxine (SYNTHROID) 125 MCG tablet   Prediabetes    Continue to monitor, encouraged limiting added sugars/carbs in diet.       MDD (major depressive disorder), recurrent, in full remission (HCC)    Sertraline previously titrated to 100mg , but she's tapered off due to concern over side effects. Overall stable period off medication.       [name]    Stable period       Chronic gout    Chronic, good control on low dose allopurinol as evidenced by no gout flares and low urate levels.       Morbid obesity with BMI of 50.0-59.9, adult (HCC)    Activity limited by knee pain.  Obesity complicated by comorbidities of osteoarthritis, depression, OSA, prediabetes, gout.       Cough    Lungs clear. Anticipate viral URI related symptoms, rec conservative measures .       Partial epilepsy with impairment of consciousness (HCC)    Seizures during acute illness. Continues keppra.       Presenile dementia, uncomplicated (HCC)    On namenda for down's related cognitive deficits.       Macrocytosis without anemia    Chronic macrocytosis, unclear etiology - work up has included normal periph smear, b12, folate.  Consider copper levels next labwork.       Osteoarthritis of left knee    Chronic, severe, recently received injections.  Tramadol refilled to use PRN.       Relevant Medications   allopurinol (ZYLOPRIM) 100 MG tablet   traMADol (ULTRAM) 50 MG tablet   Ibuprofen 200 MG CAPS   Endometrial hyperplasia, simple    Followed by GYN - appreciate Dr 06-25-1994 care.  S/p progesterone IUD with planned biopsy yearly to follow hyperplasia.      History of deep vein thrombosis (DVT) of lower extremity    04/2021 - s/p AC course, per mom latest 05/2021 showed resolution of DVT.       Varicose veins of leg with swelling, bilateral   Lymphedema    Followed by [facility]. Has been unable to wear compression stockings.  Planning to try lymphedema pump.       Other Visit Diagnoses     Need for immunization against influenza       Relevant Orders   Flu Vaccine QUAD 81mo+IM (Fluarix, Fluzone & Alfiuria Quad PF) (Completed)   Special screening for malignant neoplasms, colon       Relevant Orders  Cologuard        Meds ordered this encounter  Medications   allopurinol (ZYLOPRIM) 100 MG tablet    Sig: Take 1 tablet (100 mg total) by mouth at bedtime.    Dispense:  90 tablet    Refill:  3   levothyroxine (SYNTHROID) 125 MCG tablet    Sig: Take 1 tablet (125 mcg total) by mouth daily before breakfast.    Dispense:  90 tablet    Refill:  3   traMADol (ULTRAM) 50 MG tablet    Sig: Take 1 tablet (50 mg total) by mouth 2 (two) times daily as needed for moderate pain.    Dispense:  30 tablet    Refill:  0   Ibuprofen 200 MG CAPS    Sig: Take 3 capsules (600 mg total) by mouth 2 (two) times daily as needed.   Orders Placed This Encounter  Procedures   Flu Vaccine QUAD 4950mo+IM (Fluarix, Fluzone & Alfiuria Quad PF)   Cologuard    Patient instructions: If interested, check with pharmacy about new 2 shot shingles series (shingrix).  Flu shot today  Tramadol refilled. Good to see you today Return as needed or in 1 year for next physical.   Follow up plan: Return in about 1 year (around 01/18/2023) for annual exam, prior fasting for blood work, medicare wellness visit.  Eustaquio BoydenJavier Zaydon Kinser, MD

## 2022-01-21 NOTE — Assessment & Plan Note (Signed)
TSH stable on levothyroxine daily.

## 2022-01-21 NOTE — Assessment & Plan Note (Addendum)
Followed by GYN - appreciate Dr Tawni Levy care.  S/p progesterone IUD with planned biopsy yearly to follow hyperplasia.

## 2022-01-21 NOTE — Assessment & Plan Note (Signed)
Followed by [facility]. Has been unable to wear compression stockings. Planning to try lymphedema pump.

## 2022-01-21 NOTE — Assessment & Plan Note (Signed)
Stable period.  

## 2022-01-21 NOTE — Assessment & Plan Note (Signed)
Seizures during acute illness. Continues keppra.

## 2022-01-21 NOTE — Assessment & Plan Note (Addendum)
Sertraline previously titrated to 100mg , but she's tapered off due to concern over side effects. Overall stable period off medication.

## 2022-01-21 NOTE — Assessment & Plan Note (Signed)
Chronic macrocytosis, unclear etiology - work up has included normal periph smear, b12, folate.  Consider copper levels next labwork.

## 2022-01-21 NOTE — Assessment & Plan Note (Signed)
Lungs clear. Anticipate viral URI related symptoms, rec conservative measures .

## 2022-01-21 NOTE — Assessment & Plan Note (Signed)
Advanced directives: mom would be HCPOA but this has not been set up yet - encouraged they work on this.

## 2022-01-21 NOTE — Assessment & Plan Note (Signed)
Chronic, good control on low dose allopurinol as evidenced by no gout flares and low urate levels.

## 2022-01-21 NOTE — Assessment & Plan Note (Addendum)
Preventative protocols reviewed and updated unless pt declined. Discussed healthy diet and lifestyle.  Cologuard ordered.  Has established with GYN for well woman.

## 2022-01-21 NOTE — Assessment & Plan Note (Signed)
Continue to monitor, encouraged limiting added sugars/carbs in diet.

## 2022-01-21 NOTE — Assessment & Plan Note (Signed)
Chronic, severe, recently received injections.  Tramadol refilled to use PRN.

## 2022-01-21 NOTE — Assessment & Plan Note (Signed)
04/2021 - s/p AC course, per mom latest US showed resolution of DVT.

## 2022-01-21 NOTE — Assessment & Plan Note (Signed)
On namenda for down's related cognitive deficits.

## 2022-01-21 NOTE — Assessment & Plan Note (Addendum)
Activity limited by knee pain.  Obesity complicated by comorbidities of osteoarthritis, depression, OSA, prediabetes, gout.

## 2022-02-14 ENCOUNTER — Ambulatory Visit (INDEPENDENT_AMBULATORY_CARE_PROVIDER_SITE_OTHER): Payer: Medicare PPO | Admitting: Podiatry

## 2022-02-14 ENCOUNTER — Encounter: Payer: Self-pay | Admitting: Podiatry

## 2022-02-14 DIAGNOSIS — B351 Tinea unguium: Secondary | ICD-10-CM

## 2022-02-14 DIAGNOSIS — M79674 Pain in right toe(s): Secondary | ICD-10-CM | POA: Diagnosis not present

## 2022-02-14 DIAGNOSIS — I872 Venous insufficiency (chronic) (peripheral): Secondary | ICD-10-CM | POA: Diagnosis not present

## 2022-02-14 DIAGNOSIS — M79675 Pain in left toe(s): Secondary | ICD-10-CM

## 2022-02-14 NOTE — Progress Notes (Signed)
This patient returns to my office for at risk foot care.  This patient requires this care by a professional since this patient will be at risk due to having venous stasis and diabetes.  This patient is unable to cut nails herself since the patient cannot reach her nails.These nails are painful walking and wearing shoes.  This patient presents for at risk foot care today.  General Appearance  Alert, conversant and in no acute stress.  Vascular  Dorsalis pedis and posterior tibial  pulses are not palpable due to swelling. bilaterally.  Capillary return is within normal limits  bilaterally. Temperature is within normal limits  bilaterally. Venous stasis legs  B/l.  Neurologic  Senn-Weinstein monofilament wire test within normal limits  bilaterally. Muscle power within normal limits bilaterally.  Nails Thick disfigured discolored nails with subungual debris  from hallux to fifth toes bilaterally. No evidence of bacterial infection or drainage bilaterally.  Orthopedic  No limitations of motion  feet .  No crepitus or effusions noted.  No bony pathology or digital deformities noted.  Skin  normotropic skin with no porokeratosis noted bilaterally.  No signs of infections or ulcers noted.     Onychomycosis  Pain in right toes  Pain in left toes  Consent was obtained for treatment procedures.   Mechanical debridement of nails 1-5  bilaterally performed with a nail nipper.  Filed with dremel without incident.    Return office visit   4 months                   Told patient to return for periodic foot care and evaluation due to potential at risk complications.   Helane Gunther DPM

## 2022-02-17 ENCOUNTER — Ambulatory Visit (INDEPENDENT_AMBULATORY_CARE_PROVIDER_SITE_OTHER): Payer: Medicare PPO | Admitting: Vascular Surgery

## 2022-03-28 ENCOUNTER — Ambulatory Visit (INDEPENDENT_AMBULATORY_CARE_PROVIDER_SITE_OTHER): Payer: Medicare PPO | Admitting: Vascular Surgery

## 2022-03-28 ENCOUNTER — Encounter (INDEPENDENT_AMBULATORY_CARE_PROVIDER_SITE_OTHER): Payer: Self-pay | Admitting: Vascular Surgery

## 2022-03-28 VITALS — BP 99/66 | HR 70 | Ht <= 58 in | Wt 239.0 lb

## 2022-03-28 DIAGNOSIS — I890 Lymphedema, not elsewhere classified: Secondary | ICD-10-CM

## 2022-03-28 DIAGNOSIS — Z6841 Body Mass Index (BMI) 40.0 and over, adult: Secondary | ICD-10-CM

## 2022-03-28 DIAGNOSIS — R7303 Prediabetes: Secondary | ICD-10-CM

## 2022-03-28 DIAGNOSIS — I83893 Varicose veins of bilateral lower extremities with other complications: Secondary | ICD-10-CM

## 2022-03-28 DIAGNOSIS — Q909 Down syndrome, unspecified: Secondary | ICD-10-CM

## 2022-03-28 NOTE — Progress Notes (Signed)
Patient left before being seen.

## 2022-04-02 ENCOUNTER — Other Ambulatory Visit: Payer: Self-pay | Admitting: Adult Health

## 2022-04-05 NOTE — Progress Notes (Unsigned)
[facility]  PATIENT: [name] DOB: 10-Mar-1970   REASON FOR VISIT: Follow-up for seizure disorder, [name] and memory loss HISTORY FROM: Patient and mom   No chief complaint on file.     HISTORY OF PRESENT ILLNESS:  Update 04/06/2022 JM: Patient returns for yearly follow-up accompanied by her mother.  No additional seizure activity, remains on Keppra 500 mg twice daily. Cognition *** Remains on Namenda 10 mg twice daily Prior concerns of underlying depression, currently on sertraline 100 mg nightly after gradual titration.       History provided for reference is only Update 03/31/2021 JM: Returns today for overdue seizure follow-up as well as underlying cognitive impairment with history of Down syndrome.  She is accompanied by her mother, [name].  Previously seen approx 9 months ago. Stable from seizure standpoint, no witnessed seizure activity, compliant on Keppra 500 mg twice daily, denies side effects. At prior visit, concern of underlying depression contributing to cognitive decline, started sertraline 25mg  nightly initially with great improvement in regards to depression and stabilizing cognition but over past 2-3 months, has been having increased outbursts, tearfulness and cognitive decline.  Compliant on Namenda 10 mg twice daily, denies side effects.  No further concerns at this time.  Update 07/01/2020 JM: [name] returns for yearly follow-up with history of seizure disorder and cognitive impairment with history of Down syndrome.  She is accompanied by her mother, [name] from a seizure standpoint without seizure activity and remains on Keppra 500 mg twice daily without side effects.   Cognition has been gradually declining and over the past year increased behavioral concerns including agitation, outbursts, sundowning, sleep-wake disturbance and slower movements.  She has remained on Namenda 10 mg twice daily tolerating without side  effects.  Possible depression as mother will find her upset and crying occasionally.  She continues to live with her mother who is her full-time caregiver.  No new concerns at this time  Update 03/03/2019 JM: [name] is a 52 year old female who is being seen today via virtual visit for 1 year follow-up with history of seizure disorder, Down syndrome and memory loss.  She remains on Keppra 500 mg twice daily tolerating well without recent seizure activity.  Continues on Namenda 10 mg twice daily and memory has been stable.  She also has a history of OSA and continues use of CPAP with ongoing follow-up by Dr. 52.  She is currently prescribed multiple as needed pain medications such as tramadol, naproxen and meloxicam due to ongoing knee pain unable to undergo knee replacement due to being overweight.  Continues to attempt weight loss but due to limited mobility, this has been difficult.  No further concerns at this time.  UPDATE 12/30/2019CM Christine. 02/27/2018, 52 year old female returns for follow-up with history of seizure disorder Down syndrome and memory loss.  She also has a history of obstructive sleep apnea but her CPAP is followed by Dr. 57.  She remains on Keppra 500 twice daily without further seizure activity.  She is on Namenda 10 mg twice daily and her memory is stable according to the mother.  She is having problems with her left knee and needs to have knee replacement however she needs to lose weight prior to the surgery according to mom.  She no longer goes to adult daycare but helps her mom around the house.  No behavior issues..  She returns for reevaluation  UPDATE 12/26/2018CM Christine. 02/23/2017, 52 year old female returns for follow-up with  a history of Down syndrome and cognitive decline over several years.  She also has a seizure disorder which is currently well controlled on Keppra 500 mg twice daily.  She remains on Namenda 10 mg twice daily.  She gets little exercise and is overweight.  She  continues to go to adult care 5 days a week.  She has a history of obstructive sleep apnea and is on CPAP with additional oxygen at night.  No recent behavior issues.  She has a history of gout.  She had recent ankle surgery for a torn tendon and is wearing a boot on the right foot.  No recent behavior issues.  Returns for reevaluation  UPDATE 12/15/2015 CM [name], 52 year old female returns for followup with her mother. She has a history of Downs syndrome and  cognitive decline over several years as well as seizure disorder. She is currently well controlled on Keppra and she is on Namenda without side effects to either drug. She is overweight, gets little exercise, lives at home. She goes to adult daycare 5 days a week . She has been having a lot of abdominal pain and diarrhea, she has appointment with GI in December.  She occasionally has behavior problems and was recently placed on Zoloft however it made her irritability worse and she is slowly titrating off this. She needs refills on Keppra and Namenda . She returns for reevaluation  Update 10/14/16PS : She returns for follow-up after last visit a year ago. She is a complaint by sister who provides most of the history. She continues to do well and has not had any breakthrough seizures now for a few years. Her memory and cognitive difficulties remain unchanged. She requires close supervision at home and does spend the day at the daycare center. She can feed dress herself and bathe herself. She is never left alone. There have been no issues with agitation, behavior, delusions or hallucinations or safety concerns. Her gait imbalance remained stable and she has had no falls.  HISTORY: [name] is a 67 year year old Caucasian lady with lifelong history of Down`s syndrome and cognitive decline in last several years from dementia who is referred back to see need for a new problem of seizures. She had recent prolonged hospital stay [facility] with empyema and ventilator dependent respiratory failure. She underwent video assisted thoracotomy for empyma drainage. She required prolonged antibiotics and was eventually required tracheostomy. She had 2 brief episodes of what sound like complex partial seizures one week apart in September 2013. Her mother who is present today was eyewitness to both episodes. She was described as staring and unresponsive not following commands with a distant look on her face lasting a few minutes followed by a period of disorientation and tiredness. In the second episode she had some clonic activity in both her upper extremities. She was started on Keppra 500 twice a day falling and abnormal EEG on 11/04/2011 and with showed sharp waves in the left occipital parietal region. She was initially off her Namenda and Prozac in the hospital stay and had some behavioral agitation but settled down after the medicines were restarted. She has done well since discharge and has not had any more seizures. She seems to be tolerating Keppra quite well without any side effects. She is finished a course of antibiotics and home physical and occupation therapy. She is back living at home with mom and plans to start attending adult  daycare soon. She apparently did not have any brain imaging study done.  03/11/12: [name] returns for followup. She was last seen by Dr. [name] 12/11/11: She had prolonged hospitilization in Sept 2013 and had seizure events. She was started on Keppra at that time. No further seizure events. MRI of the brain 12/18/11 was normal. EEG was abnormal suggestive of mild focal irritability in right frontal and temporal regions but no definite epileptiform  activity is noted. ROS neg.    REVIEW OF SYSTEMS: Full 14 system review of systems performed and notable only for those listed, all others are neg:  Constitutional: neg  Cardiovascular: neg Ear/Nose/Throat: neg  Skin:  neg Eyes: neg Respiratory: neg Gastroitestinal: neg Hematology/Lymphatic: neg  Endocrine: neg Musculoskeletal: BLE edema, pain Allergy/Immunology: neg Neurological: History of seizure disorder, Down syndrome, cognitive impairment with behaviors Psychiatric: depression, anxiety Sleep : neg   ALLERGIES: No Known Allergies  HOME MEDICATIONS: Outpatient Medications Prior to Visit  Medication Sig Dispense Refill   acetaminophen (TYLENOL) 650 MG CR tablet Take 1,300 mg by mouth every 8 (eight) hours as needed for pain.     allopurinol (ZYLOPRIM) 100 MG tablet Take 1 tablet (100 mg total) by mouth at bedtime. 90 tablet 3   aspirin EC 81 MG tablet Take 1 tablet (81 mg total) by mouth daily. Swallow whole.     clobetasol cream (TEMOVATE) 0.05 % APPLY TO SKIN RASH 2 TIMES DAILY AS NEEDED. MAX 2 WEEKS AT A TIME, AVOID FACE, UNDERARMS, GROIN. 45 g 0   Ibuprofen 200 MG CAPS Take 3 capsules (600 mg total) by mouth 2 (two) times daily as needed.     levETIRAcetam (KEPPRA) 500 MG tablet TAKE 1 TABLET BY MOUTH TWICE A DAY 180 tablet 3   levothyroxine (SYNTHROID) 125 MCG tablet Take 1 tablet (125 mcg total) by mouth daily before breakfast. 90 tablet 3   memantine (NAMENDA) 10 MG tablet TAKE 1 TABLET BY MOUTH TWICE A DAY 180 tablet 3   Multiple Vitamins-Minerals (WOMENS MULTI PO) Take 1 tablet by mouth at bedtime.     silver sulfADIAZINE (SILVADENE) 1 % cream Apply 1 application. topically daily. (Patient taking differently: Apply 1 application  topically daily as needed.) 50 g 0   traMADol (ULTRAM) 50 MG tablet Take 1 tablet (50 mg total) by mouth 2 (two) times daily as needed for moderate pain. 30 tablet 0   No facility-administered medications prior to visit.    PAST MEDICAL HISTORY: Past Medical History:  Diagnosis Date   Bruise    per pt mother bruising left leg post op laser procedure on 10-25-2021   Chronic idiopathic gout of knee without tophus    followed by pcp----   affects left knee  (10-28-2021  per pt mother last flare-up several yrs ago)   Dementia due to another medical condition [facility])    neurologist--- dr [name];   secondary to down syndrome (Trisomy 21-related),  nl CT head 2010, did not tolerate aricept   Down syndrome    Eczema    Edema of both lower extremities    Endometrial hyperplasia    H/O pleural empyema 09/24/2011   acute  in setting CAP /   s/p left  VATS with drainage empyema,  post op acute respiratory failure --- resolved   History of DVT of lower extremity 05/11/2021   dx left lower extremity popliteal vein,  completed 3 months eliquis then changed to ASA 81mg    History of recurrent pneumonia  multiple pneumonias   Hypothyroidism    MDD (major depressive disorder)    OA (osteoarthritis)    bilat knee arthritis, had 3/3 Supartz injections [name])   OCD (obsessive compulsive disorder)    OSA on CPAP 05/2012   folowed by dr Craige Cotta---- study in epic, severe OSA with AHI 118, desat nadir to 67%   (per pt mother pt uses nightly)   Pre-diabetes    Seizure disorder [facility]) 2013   neurologist--- dr [name];   (10-28-2021  per pt mother no seizure since 2013)    long hospitalization, x 2 ; "very mild"; during hospitalization "while coming off vent"   Varicose veins of both lower extremities    followed by dr j. dew ---   with venous reflux,   10-25-2021  s/p laser procedure left leg GSV and SF   Wears glasses     PAST SURGICAL HISTORY: Past Surgical History:  Procedure Laterality Date   ACHILLES TENDON REPAIR Right 01/2017   [facility]. [facility]   ACHILLES TENDON REPAIR Right 01/2017   ANKLE ARTHROSCOPY Right 11/11/2020   @[facility]  by dr ;    debridement and stablilzation lateral collateral ligament   ENDOSCOPIC VEIN LASER TREATMENT Left 10/25/2021   by dr j. dew;   left leg GSV and SF for venous reflux   FLEXIBLE BRONCHOSCOPY  09/24/2011   Procedure: FLEXIBLE BRONCHOSCOPY;  Surgeon: 09/26/2011, MD;  Location: [facility] OR;  Service: Thoracic;   Laterality: N/A;   HYSTEROSCOPY WITH D & C N/A 09/27/2021   Procedure: DILATATION AND CURETTAGE /HYSTEROSCOPY;  Surgeon: 11/27/2021, MD;  Location: [facility] OR;  Service: Gynecology;  Laterality: N/A;   INTRAUTERINE DEVICE (IUD) INSERTION N/A 11/07/2021   Procedure: INTRAUTERINE DEVICE (IUD) INSERTION;  Surgeon: 01/07/2022, MD;  Location: [facility] [place];  Service: Gynecology;  Laterality: N/A;   LUMBAR DISC SURGERY  07/18/2004   @[facility]  by dr 07/20/2004;   L4--5 disectomy and left L3--4 nerve decompression   TRACHEOSTOMY TUBE PLACEMENT  10/11/2011   Procedure: TRACHEOSTOMY;  Surgeon: Wynetta Emery, MD;  Location: [facility] OR;  Service: ENT;  Laterality: N/A;   VIDEO ASSISTED THORACOSCOPY (VATS) /THOROCOTOMY/RADIOACTIVE SEED IMPLANT Left 09/24/2011   VATS, (Dr. [facility])    FAMILY HISTORY: Family History  Problem Relation Age of Onset   Arthritis Mother    Hypertension Father    Diabetes Father    Cancer Neg Hx    Coronary artery disease Neg Hx    Stroke Neg Hx     SOCIAL HISTORY: Social History   Socioeconomic History   Marital status: Single    Spouse name: Not on file   Number of children: 0   Years of education: Not on file   Highest education level: Not on file  Occupational History   Not on file  Tobacco Use   Smoking status: Never   Smokeless tobacco: Never  Vaping Use   Vaping Use: Never used  Substance and Sexual Activity   Alcohol use: Never   Drug use: Never   Sexual activity: Never    Birth control/protection: None  Other Topics Concern   Not on file  Social History Narrative   Lives with mom and dad, dogs   Stays at daycare during the day   Activity: walking some   Diet: good water, fruits/vegetables daily   Social Determinants of Health   Financial Resource Strain: Low Risk  (10/20/2021)   Overall Financial Resource Strain (CARDIA)    Difficulty  of Paying Living Expenses: Not hard at all  Food Insecurity: No Food Insecurity (10/20/2021)   Hunger Vital  Sign    Worried About Running Out of Food in the Last Year: Never true    Ran Out of Food in the Last Year: Never true  Transportation Needs: No Transportation Needs (10/20/2021)   PRAPARE - Hydrologist (Medical): No    Lack of Transportation (Non-Medical): No  Physical Activity: Inactive (10/20/2021)   Exercise Vital Sign    Days of Exercise per Week: 0 days    Minutes of Exercise per Session: 0 min  Stress: No Stress Concern Present (10/20/2021)   [place]    Feeling of Stress : Only a little  Social Connections: Socially Isolated (10/20/2021)   Social Connection and Isolation Panel [NHANES]    Frequency of Communication with Friends and Family: Twice a week    Frequency of Social Gatherings with Friends and Family: Three times a week    Attends Religious Services: Never    Active Member of Clubs or Organizations: No    Attends Archivist Meetings: Never    Marital Status: Never married  Intimate Partner Violence: Not At Risk (10/20/2021)   Humiliation, Afraid, Rape, and Kick questionnaire    Fear of Current or Ex-Partner: No    Emotionally Abused: No    Physically Abused: No    Sexually Abused: No     PHYSICAL EXAM There were no vitals filed for this visit.   There is no height or weight on file to calculate BMI.  General: well nourished with Down's features, pleasant middle-age Caucasian female, seated, in no evident distress Head: head normocephalic and atraumatic.     Neurologic Exam Mental Status: Awake and fully alert. Fluent speech and language with child like quality.  Disoriented to current place and year but able to state her address and birthdate. Recent and remote memory impaired. Attention span, concentration and fund of knowledge impaired with mother providing history. Mood and affect appropriate.  Cranial Nerves: Pupils equal, briskly reactive to light.  Extraocular movements full without nystagmus. Visual fields full to confrontation. Hearing intact. Facial sensation intact. Face, tongue, palate moves normally and symmetrically.  Motor: Normal bulk and tone. Normal strength in all tested extremity muscles Sensory.: intact to touch , pinprick , position and vibratory sensation.  Coordination: Rapid alternating movements normal in all extremities. Finger-to-nose and heel-to-shin performed accurately bilaterally. Gait and Station: Arises from chair without difficulty. Stance is normal. Gait demonstrates  broad-based gait with walking boot right foot without use of assistive device.  Tandem walk and heel toe not attempted.  Reflexes: 1+ and symmetric. Toes downgoing.       DIAGNOSTIC DATA (LABS, IMAGING, TESTING) - I reviewed patient records, labs, notes, testing and imaging myself where available.  Lab Results  Component Value Date   WBC 4.3 01/10/2022   HGB 13.7 01/10/2022   HCT 41.5 01/10/2022   MCV 105.4 (H) 01/10/2022   PLT 200.0 01/10/2022      Component Value Date/Time   NA 138 01/10/2022 0759   K 4.4 01/10/2022 0759   CL 102 01/10/2022 0759   CO2 31 01/10/2022 0759   GLUCOSE 98 01/10/2022 0759   BUN 21 01/10/2022 0759   CREATININE 0.97 01/10/2022 0759   CALCIUM 8.9 01/10/2022 0759   PROT 7.0 01/10/2022 0759   ALBUMIN 4.1 01/10/2022 0759   AST 18 01/10/2022 0759  ALT 17 01/10/2022 0759   ALKPHOS 50 01/10/2022 0759   BILITOT 0.2 01/10/2022 0759   GFRNONAA >60 10/06/2019 1655   GFRAA >60 10/06/2019 1655    Lab Results  Component Value Date   HGBA1C 6.0 01/10/2022   Lab Results  Component Value Date   URKYHCWC37 628 01/10/2022   Lab Results  Component Value Date   TSH 4.77 01/10/2022      ASSESSMENT AND PLAN 52 y.o. year old female  has a past medical history of Downs syndrome; Depression;  Seizure; and memory loss    1.  Seizure disorder -Stable without seizure activity -Continue Keppra 500 mg twice  daily for seizure prophylaxis -refill provided  2.  Dementia due to Down syndrome 3. Depression/anxiety -increase sertraline to 50 mg nightly. Advised if no improvement after 3-4 weeks, we can discuss increasing dose or call earlier if any difficulty tolerating -Continue Namenda 10 mg twice daily -refill provided    Follow-up in 1 year or call earlier if needed   CC:  Ria Bush, MD     I spent 24 minutes of face-to-face and non-face-to-face time with patient.  This included previsit chart review, lab review, study review, order entry, electronic health record documentation, patient and mother education and discussion re: above diagnoses, treatment options and treatment plan and answered all the questions to patient and mother satisfaction   Frann Rider, [facility]  [facility] [address] [place], [place] [address]  Phone [phone] Fax [phone] Note: This document was prepared with digital dictation and possible smart phrase technology. Any transcriptional errors that result from this process are unintentional.

## 2022-04-06 ENCOUNTER — Encounter: Payer: Self-pay | Admitting: Adult Health

## 2022-04-06 ENCOUNTER — Ambulatory Visit (INDEPENDENT_AMBULATORY_CARE_PROVIDER_SITE_OTHER): Payer: Medicare PPO | Admitting: Adult Health

## 2022-04-06 VITALS — BP 118/72 | Ht <= 58 in | Wt 225.5 lb

## 2022-04-06 DIAGNOSIS — Q909 Down syndrome, unspecified: Secondary | ICD-10-CM

## 2022-04-06 DIAGNOSIS — F0280 Dementia in other diseases classified elsewhere without behavioral disturbance: Secondary | ICD-10-CM | POA: Diagnosis not present

## 2022-04-06 DIAGNOSIS — R569 Unspecified convulsions: Secondary | ICD-10-CM | POA: Diagnosis not present

## 2022-04-06 NOTE — Patient Instructions (Addendum)
Continue keppra 500mg  twice daily  Continue Namenda 10mg  twice daily     Follow up in 1 year or call earlier if needed

## 2022-04-11 ENCOUNTER — Encounter (INDEPENDENT_AMBULATORY_CARE_PROVIDER_SITE_OTHER): Payer: Self-pay

## 2022-04-11 ENCOUNTER — Ambulatory Visit (INDEPENDENT_AMBULATORY_CARE_PROVIDER_SITE_OTHER): Payer: Medicare PPO | Admitting: Vascular Surgery

## 2022-04-21 ENCOUNTER — Ambulatory Visit (INDEPENDENT_AMBULATORY_CARE_PROVIDER_SITE_OTHER): Payer: Medicare PPO

## 2022-04-21 ENCOUNTER — Ambulatory Visit (INDEPENDENT_AMBULATORY_CARE_PROVIDER_SITE_OTHER): Payer: Medicare PPO | Admitting: Podiatry

## 2022-04-21 DIAGNOSIS — M778 Other enthesopathies, not elsewhere classified: Secondary | ICD-10-CM | POA: Diagnosis not present

## 2022-04-21 DIAGNOSIS — R52 Pain, unspecified: Secondary | ICD-10-CM | POA: Diagnosis not present

## 2022-04-21 DIAGNOSIS — M25371 Other instability, right ankle: Secondary | ICD-10-CM

## 2022-04-21 MED ORDER — BETAMETHASONE SOD PHOS & ACET 6 (3-3) MG/ML IJ SUSP
3.0000 mg | Freq: Once | INTRAMUSCULAR | Status: AC
  Administered 2022-04-21: 3 mg via INTRA_ARTICULAR

## 2022-04-21 NOTE — Progress Notes (Signed)
Chief Complaint  Patient presents with   Foot Pain    Right foot and ankle pain, started 3 weeks ago X-rays done today     HPI: 52 y.o. female presenting today for evaluation of a new complaint of right foot and ankle pain has been ongoing for about 3 weeks.  No history of injury.  Idiopathic onset.  Patient states that it is sore with ambulation.  They have not done anything for treatment currently.  Presenting with her mother.  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 [facility])    neurologist--- dr Leonie Man;   secondary to down syndrome (Trisomy 21-related),  nl CT head 2010, did not tolerate aricept   Down syndrome    Eczema    Edema of both lower extremities    Endometrial hyperplasia    H/O pleural empyema 09/24/2011   acute  in setting CAP /   s/p left  VATS with drainage empyema,  post op acute respiratory failure --- resolved   History of DVT of lower extremity 05/11/2021   dx left lower extremity popliteal vein,  completed 3 months eliquis then changed to ASA '81mg'$    History of recurrent pneumonia    multiple pneumonias   Hypothyroidism    MDD (major depressive disorder)    OA (osteoarthritis)    bilat knee arthritis, had 3/3 Supartz injections [facility])   OCD (obsessive compulsive disorder)    OSA on CPAP 05/2012   folowed by dr Halford Chessman---- study in epic, severe OSA with AHI 118, desat nadir to 67%   (per pt mother pt uses nightly)   Pre-diabetes    Seizure disorder [facility]) 2013   neurologist--- dr Leonie Man;   (10-28-2021  per pt mother no seizure since 2013)    long hospitalization, x 2 ; "very mild"; during hospitalization "while coming off vent"   Varicose veins of both lower extremities    followed by dr j. dew  ---   with venous reflux,   10-25-2021  s/p laser procedure left leg GSV and SF   Wears glasses     Past Surgical History:  Procedure Laterality Date   ACHILLES TENDON REPAIR Right 01/2017   Dr. [facility]   ACHILLES TENDON REPAIR Right 01/2017   ANKLE ARTHROSCOPY Right 11/11/2020   '@[facility]'$  by dr Amalia Hailey;    debridement and stablilzation lateral collateral ligament   ENDOSCOPIC VEIN LASER TREATMENT Left 10/25/2021   by dr j. dew;   left leg GSV and SF for venous reflux   FLEXIBLE BRONCHOSCOPY  09/24/2011   Procedure: FLEXIBLE BRONCHOSCOPY;  Surgeon: Rexene Alberts, MD;  Location: [facility] OR;  Service: Thoracic;  Laterality: N/A;   HYSTEROSCOPY WITH D & C N/A 09/27/2021   Procedure: DILATATION AND CURETTAGE /HYSTEROSCOPY;  Surgeon: Donnamae Jude, MD;  Location: [place];  Service: Gynecology;  Laterality: N/A;   INTRAUTERINE DEVICE (IUD) INSERTION N/A 11/07/2021   Procedure: INTRAUTERINE DEVICE (IUD) INSERTION;  Surgeon: Donnamae Jude, MD;  Location: [place];  Service: Gynecology;  Laterality: N/A;   LUMBAR DISC SURGERY  07/18/2004   '@[facility]'$  by dr Saintclair Halsted;   L4--5 disectomy and left L3--4 nerve decompression  TRACHEOSTOMY TUBE PLACEMENT  10/11/2011   Procedure: TRACHEOSTOMY;  Surgeon: Izora Gala, MD;  Location: [place];  Service: ENT;  Laterality: N/A;   VIDEO ASSISTED THORACOSCOPY (VATS) /THOROCOTOMY/RADIOACTIVE SEED IMPLANT Left 09/24/2011   VATS, (Dr. Ricard Dillon)    No Known Allergies   Physical Exam: General: The patient is alert and oriented x3 in no acute distress.  Dermatology: Skin is warm, dry and supple bilateral lower extremities. Negative for open lesions or macerations.  Vascular: Palpable pedal pulses bilaterally. Capillary refill within normal limits.  Chronic edema lower extremity  Neurological: Light touch and protective threshold grossly intact  Musculoskeletal Exam: No pedal deformities noted.  There is tenderness with palpation directly overlying the  talonavicular joint just distal to the anterior portion of the ankle consistent with radiographic findings of advanced arthritic changes to this area  Radiographic Exam RT ankle 04/21/2022:  Tibiotalar joint congruent.  Advanced severe degenerative changes noted to the talonavicular joint.  No acute fractures identified.    Assessment: 1.  Talonavicular capsulitis/arthritis right foot   Plan of Care:  1. Patient evaluated. X-Rays reviewed.  2.  Injection of 0.5 cc Celestone Soluspan injected in the talonavicular region right foot just distal to the anterior portion of the ankle joint 3.  Recommend good supportive shoes and sneakers.  Patient does have a immobilization cam boot at home.  If his pain continues and persists recommend WBAT cam boot x 3 weeks 4.  Recommend OTC Motrin as needed 5.  Return to clinic as needed       Edrick Kins, DPM [facility]  Dr. Edrick Kins, DPM    [address], [place] [address]                Office [phone]  Fax [phone]

## 2022-04-27 ENCOUNTER — Other Ambulatory Visit: Payer: Self-pay | Admitting: Podiatry

## 2022-04-27 DIAGNOSIS — M778 Other enthesopathies, not elsewhere classified: Secondary | ICD-10-CM

## 2022-04-27 DIAGNOSIS — R52 Pain, unspecified: Secondary | ICD-10-CM

## 2022-04-27 DIAGNOSIS — M25371 Other instability, right ankle: Secondary | ICD-10-CM

## 2022-05-23 ENCOUNTER — Encounter: Payer: Self-pay | Admitting: Podiatry

## 2022-05-23 ENCOUNTER — Ambulatory Visit (INDEPENDENT_AMBULATORY_CARE_PROVIDER_SITE_OTHER): Payer: Medicare PPO | Admitting: Podiatry

## 2022-05-23 DIAGNOSIS — B351 Tinea unguium: Secondary | ICD-10-CM | POA: Diagnosis not present

## 2022-05-23 DIAGNOSIS — M79675 Pain in left toe(s): Secondary | ICD-10-CM | POA: Diagnosis not present

## 2022-05-23 DIAGNOSIS — M79674 Pain in right toe(s): Secondary | ICD-10-CM | POA: Diagnosis not present

## 2022-05-23 DIAGNOSIS — M25571 Pain in right ankle and joints of right foot: Secondary | ICD-10-CM

## 2022-05-23 DIAGNOSIS — G8929 Other chronic pain: Secondary | ICD-10-CM

## 2022-05-23 DIAGNOSIS — I890 Lymphedema, not elsewhere classified: Secondary | ICD-10-CM

## 2022-05-23 NOTE — Progress Notes (Signed)
This patient returns to my office for at risk foot care.  This patient requires this care by a professional since this patient will be at risk due to having venous stasis and diabetes.  This patient is unable to cut nails herself since the patient cannot reach her nails.These nails are painful walking and wearing shoes.  This patient presents for at risk foot care today.  General Appearance  Alert, conversant and in no acute stress.  Vascular  Dorsalis pedis and posterior tibial  pulses are not palpable due to swelling. bilaterally.  Capillary return is within normal limits  bilaterally. Temperature is within normal limits  bilaterally. Venous stasis legs  B/l.  Neurologic  Senn-Weinstein monofilament wire test within normal limits  bilaterally. Muscle power within normal limits bilaterally.  Nails Thick disfigured discolored nails with subungual debris  from hallux to fifth toes bilaterally. No evidence of bacterial infection or drainage bilaterally.  Orthopedic  No limitations of motion  feet .  No crepitus or effusions noted.  No bony pathology or digital deformities noted.  Skin  normotropic skin with no porokeratosis noted bilaterally.  No signs of infections or ulcers noted.     Onychomycosis  Pain in right toes  Pain in left toes  Consent was obtained for treatment procedures.   Mechanical debridement of nails 1-5  bilaterally performed with a nail nipper.  Filed with dremel without incident.    Return office visit   3  months                   Told patient to return for periodic foot care and evaluation due to potential at risk complications.   Gardiner Barefoot DPM

## 2022-05-31 DIAGNOSIS — H30123 Disseminated chorioretinal inflammation, peripheral, bilateral: Secondary | ICD-10-CM | POA: Diagnosis not present

## 2022-05-31 DIAGNOSIS — H2513 Age-related nuclear cataract, bilateral: Secondary | ICD-10-CM | POA: Diagnosis not present

## 2022-05-31 DIAGNOSIS — H33192 Other retinoschisis and retinal cysts, left eye: Secondary | ICD-10-CM | POA: Diagnosis not present

## 2022-05-31 DIAGNOSIS — H35373 Puckering of macula, bilateral: Secondary | ICD-10-CM | POA: Diagnosis not present

## 2022-05-31 DIAGNOSIS — H33109 Unspecified retinoschisis, unspecified eye: Secondary | ICD-10-CM | POA: Diagnosis not present

## 2022-06-14 ENCOUNTER — Other Ambulatory Visit: Payer: Self-pay | Admitting: Family Medicine

## 2022-06-14 DIAGNOSIS — M1712 Unilateral primary osteoarthritis, left knee: Secondary | ICD-10-CM

## 2022-06-14 NOTE — Telephone Encounter (Signed)
Name of Medication: Tramadol Name of Pharmacy: [facility] [address] Last Fill or Written Date and Quantity: 01/17/22, #30 Last Office Visit and Type: 01/17/22, CPE Next Office Visit and Type: none Last Controlled Substance Agreement Date: none Last UDS: none

## 2022-06-16 NOTE — Telephone Encounter (Signed)
ERx 

## 2022-07-10 ENCOUNTER — Ambulatory Visit (INDEPENDENT_AMBULATORY_CARE_PROVIDER_SITE_OTHER): Payer: Medicare PPO | Admitting: Podiatry

## 2022-07-10 DIAGNOSIS — M19071 Primary osteoarthritis, right ankle and foot: Secondary | ICD-10-CM

## 2022-07-10 MED ORDER — BETAMETHASONE SOD PHOS & ACET 6 (3-3) MG/ML IJ SUSP
3.0000 mg | Freq: Once | INTRAMUSCULAR | Status: AC
  Administered 2022-07-10: 3 mg via INTRA_ARTICULAR

## 2022-07-10 NOTE — Progress Notes (Signed)
Chief Complaint  Patient presents with   Foot Pain    Patient came in today for right foot ankle pain follow-up,medial side of the ankle,  rate of pain 10 out of 10, patient is doing worse, mother states she tries not to walk much,     HPI: 52 y.o. female presenting today for follow-up evaluation of right foot and ankle pain has been ongoing for about 3 weeks.  No history of injury.  Idiopathic onset.  Patient states that it is sore with ambulation.  They have not done anything for treatment currently.  Presenting with her mother.  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 [facility])    neurologist--- dr [name];   secondary to down syndrome (Trisomy 21-related),  nl CT head 2010, did not tolerate aricept   Down syndrome    Eczema    Edema of both lower extremities    Endometrial hyperplasia    H/O pleural empyema 09/24/2011   acute  in setting CAP /   s/p left  VATS with drainage empyema,  post op acute respiratory failure --- resolved   History of DVT of lower extremity 05/11/2021   dx left lower extremity popliteal vein,  completed 3 months eliquis then changed to ASA 81mg    History of recurrent pneumonia    multiple pneumonias   Hypothyroidism    MDD (major depressive disorder)    OA (osteoarthritis)    bilat knee arthritis, had 3/3 Supartz injections [name])   OCD (obsessive compulsive disorder)    OSA on CPAP 05/2012   folowed by dr Craige Cotta---- study in epic, severe OSA with AHI 118, desat nadir to 67%   (per pt mother pt uses nightly)   Pre-diabetes    Seizure disorder [facility]) 2013   neurologist--- dr [name];   (10-28-2021  per pt mother no seizure since 2013)    long hospitalization, x 2 ; "very mild"; during  hospitalization "while coming off vent"   Varicose veins of both lower extremities    followed by dr j. dew ---   with venous reflux,   10-25-2021  s/p laser procedure left leg GSV and SF   Wears glasses     Past Surgical History:  Procedure Laterality Date   ACHILLES TENDON REPAIR Right 01/2017   [facility]   ACHILLES TENDON REPAIR Right 01/2017   ANKLE ARTHROSCOPY Right 11/11/2020   @[facility]  by dr Logan Bores;    debridement and stablilzation lateral collateral ligament   ENDOSCOPIC VEIN LASER TREATMENT Left 10/25/2021   by dr j. dew;   left leg GSV and SF for venous reflux   FLEXIBLE BRONCHOSCOPY  09/24/2011   Procedure: FLEXIBLE BRONCHOSCOPY;  Surgeon: Purcell Nails, MD;  Location: [facility] OR;  Service: Thoracic;  Laterality: N/A;   HYSTEROSCOPY WITH D & C N/A 09/27/2021   Procedure: DILATATION AND CURETTAGE /HYSTEROSCOPY;  Surgeon: Reva Bores, MD;  Location: [facility] OR;  Service: Gynecology;  Laterality: N/A;   INTRAUTERINE DEVICE (IUD) INSERTION N/A 11/07/2021   Procedure: INTRAUTERINE DEVICE (IUD) INSERTION;  Surgeon: Reva Bores, MD;  Location: [facility] [place];  Service: Gynecology;  Laterality: N/A;   LUMBAR  DISC SURGERY  07/18/2004   @[facility]  by dr Wynetta Emery;   L4--5 disectomy and left L3--4 nerve decompression   TRACHEOSTOMY TUBE PLACEMENT  10/11/2011   Procedure: TRACHEOSTOMY;  Surgeon: Serena Colonel, MD;  Location: [facility] OR;  Service: ENT;  Laterality: N/A;   VIDEO ASSISTED THORACOSCOPY (VATS) /THOROCOTOMY/RADIOACTIVE SEED IMPLANT Left 09/24/2011   VATS, (Dr. Barry Dienes)    No Known Allergies   Physical Exam: General: The patient is alert and oriented x3 in no acute distress.  Dermatology: Skin is warm, dry and supple bilateral lower extremities. Negative for open lesions or macerations.  Vascular: Palpable pedal pulses bilaterally. Capillary refill within normal limits.  Chronic edema lower extremity  Neurological: Light touch and protective threshold grossly  intact  Musculoskeletal Exam: No pedal deformities noted.  There is continued tenderness with palpation directly overlying the talonavicular joint just distal to the anterior portion of the ankle consistent with radiographic findings of advanced arthritic changes to this area  Radiographic Exam RT ankle 04/21/2022:  Tibiotalar joint congruent.  Advanced severe degenerative changes noted to the talonavicular joint.  No acute fractures identified.    Assessment: 1.  Talonavicular capsulitis/arthritis right foot   Plan of Care:  1. Patient evaluated. X-Rays reviewed.  2.  Injection of 0.5 cc Celestone Soluspan injected in the talonavicular region right foot just distal to the anterior portion of the ankle joint 3.  Recommend good supportive shoes and sneakers.  Patient does have a immobilization cam boot at home.  If his pain continues and persists recommend WBAT cam boot x 3 weeks 4.  Recommend OTC Motrin as needed 5.  Return to clinic as needed     Felecia Shelling, DPM [facility]  Dr. Felecia Shelling, DPM    [address] [place], [place] [address]                Office [phone]  Fax [phone]

## 2022-07-13 DIAGNOSIS — E662 Morbid (severe) obesity with alveolar hypoventilation: Secondary | ICD-10-CM | POA: Diagnosis not present

## 2022-07-28 DIAGNOSIS — E662 Morbid (severe) obesity with alveolar hypoventilation: Secondary | ICD-10-CM | POA: Diagnosis not present

## 2022-08-09 ENCOUNTER — Encounter (INDEPENDENT_AMBULATORY_CARE_PROVIDER_SITE_OTHER): Payer: Medicare PPO | Admitting: Ophthalmology

## 2022-08-11 ENCOUNTER — Other Ambulatory Visit: Payer: Self-pay | Admitting: Family Medicine

## 2022-08-11 DIAGNOSIS — Z1231 Encounter for screening mammogram for malignant neoplasm of breast: Secondary | ICD-10-CM

## 2022-08-14 ENCOUNTER — Ambulatory Visit (INDEPENDENT_AMBULATORY_CARE_PROVIDER_SITE_OTHER): Payer: Medicare PPO | Admitting: Pulmonary Disease

## 2022-08-14 ENCOUNTER — Encounter (HOSPITAL_BASED_OUTPATIENT_CLINIC_OR_DEPARTMENT_OTHER): Payer: Self-pay | Admitting: Pulmonary Disease

## 2022-08-14 VITALS — BP 120/64 | HR 60 | Ht <= 58 in | Wt 220.0 lb

## 2022-08-14 DIAGNOSIS — G4733 Obstructive sleep apnea (adult) (pediatric): Secondary | ICD-10-CM | POA: Diagnosis not present

## 2022-08-14 NOTE — Patient Instructions (Signed)
Will arrange for new auto CPAP machine  Follow up in 4 months 

## 2022-08-14 NOTE — Progress Notes (Signed)
[name], Critical Care, and Sleep Medicine  Chief Complaint  Patient presents with   Follow-up    F/U with CPAP. States she has been doing well with cpap. Denies any current concerns.     Past Surgical History:  She  has a past surgical history that includes Flexible bronchoscopy (09/24/2011); Tracheostomy tube placement (10/11/2011); Video assisted thoracoscopy (vats) /thorocotomy/radioactive seed implant (Left, 09/24/2011); Lumbar disc surgery (07/18/2004); Achilles tendon repair (Right, 01/2017); Hysteroscopy with D & C (N/A, 09/27/2021); Ankle arthroscopy (Right, 11/11/2020); Achilles tendon repair (Right, 01/2017); Endoscopic vein laser treatment (Left, 10/25/2021); and Intrauterine device (iud) insertion (N/A, 11/07/2021).  Past Medical History:  Dementia, Trisomy 49, Depression, OCD, DM, DJD, Eczema, Lt pleural space empyema, Hypothyroidism, PNA, Seizure   Constitutional:  BP 120/64   Pulse 60   Ht 4\' 9"  (1.448 m)   Wt 220 lb (99.8 kg)   SpO2 98% Comment: on RA  BMI 47.61 kg/m   Brief Summary:  [name] is a 52 y.o. female with severe obstructive sleep apnea.  She has hx of pneumonia and empyema with respiratory failure and prior tracheostomy.      Subjective:   She is here with her father.  I last saw her in May 2021.  Uses CPAP nightly.  No issue with pressure.  Straps from mask sometimes feel tight around the side of her head.  Physical Exam:   Appearance - well kempt   ENMT - no sinus tenderness, no oral exudate, no LAN, Mallampati 4 airway, no stridor  Respiratory - equal breath sounds bilaterally, no wheezing or rales  CV - s1s2 regular rate and rhythm, no murmurs  Ext - no clubbing, no edema  Skin - no rashes  Psych - normal mood and affect   Sleep Tests:  PSG 06/02/12 >> AHI 118.3, SpO2 low 67% ONO with CPAP and 1 liter 07/18/12 >> Test time 7 hrs 44 min.  Basal SpO2 96%, SpO2 low 81%.  Spent 0.7% of time with SpO2 < 88%. ONO with  CPAP 08/18/17 >> test tim 6 hrs 53 min.  Baseline SpO2 96%, low SpO2 82%.  Spent 5 mins 44 sec with SpO2 < 88%. Auto CPAP 05/16/22 to 08/13/22 >> used on 90 of 90 nights with average 5 hrs 56 min.  Average AHI 1 with median CPAP 11 and 95 th percentile CPAP 12 cm H2O  Cardiac Tests:  Echo 09/27/14 >> EF 55 to 60%   Social History:  She  reports that she has never smoked. She has never used smokeless tobacco. She reports that she does not drink alcohol and does not use drugs.  Family History:  Her family history includes Arthritis in her mother; Diabetes in her father; Hypertension in her father.     Assessment/Plan:   Obstructive sleep apnea. - she is compliant with CPAP and reports benefit from therapy - she uses [employer] for her DME - her current is more than 52 years old - will arrange for a new Resmed 11 auto CPAP 5 to 15 cm H2O  Seizure disorder. - followed at [facility]  Time Spent Involved in Patient Care on Day of Examination:  17 minutes  Follow up:   Patient Instructions  Will arrange for new auto CPAP machine  Follow up in 4 months  Medication List:   Allergies as of 08/14/2022   No Known Allergies      Medication List        Accurate as of August 14, 2022  2:24 PM. If you have any questions, ask your nurse or doctor.          acetaminophen 650 MG CR tablet Commonly known as: TYLENOL Take 1,300 mg by mouth every 8 (eight) hours as needed for pain.   allopurinol 100 MG tablet Commonly known as: ZYLOPRIM Take 1 tablet (100 mg total) by mouth at bedtime.   aspirin EC 81 MG tablet Take 1 tablet (81 mg total) by mouth daily. Swallow whole.   clobetasol cream 0.05 % Commonly known as: TEMOVATE APPLY TO SKIN RASH 2 TIMES DAILY AS NEEDED. MAX 2 WEEKS AT A TIME, AVOID FACE, UNDERARMS, GROIN.   Ibuprofen 200 MG Caps Take 3 capsules (600 mg total) by mouth 2 (two) times daily as needed.   levETIRAcetam 500 MG tablet Commonly known as: KEPPRA TAKE 1  TABLET BY MOUTH TWICE A DAY   levothyroxine 125 MCG tablet Commonly known as: SYNTHROID Take 1 tablet (125 mcg total) by mouth daily before breakfast.   memantine 10 MG tablet Commonly known as: NAMENDA TAKE 1 TABLET BY MOUTH TWICE A DAY   silver sulfADIAZINE 1 % cream Commonly known as: SILVADENE Apply 1 application. topically daily.   traMADol 50 MG tablet Commonly known as: ULTRAM TAKE 1 TABLET (50 MG TOTAL) BY MOUTH TWICE A DAY AS NEEDED FOR MODERATE PAIN   WOMENS MULTI PO Take 1 tablet by mouth at bedtime.        Signature:  Coralyn Helling, MD [facility] Sosa/Critical Care Pager - [phone] 08/14/2022, 2:24 PM

## 2022-08-16 ENCOUNTER — Ambulatory Visit (INDEPENDENT_AMBULATORY_CARE_PROVIDER_SITE_OTHER): Payer: Medicare PPO | Admitting: Ophthalmology

## 2022-08-16 ENCOUNTER — Encounter (INDEPENDENT_AMBULATORY_CARE_PROVIDER_SITE_OTHER): Payer: Self-pay | Admitting: Ophthalmology

## 2022-08-16 ENCOUNTER — Telehealth (INDEPENDENT_AMBULATORY_CARE_PROVIDER_SITE_OTHER): Payer: Self-pay

## 2022-08-16 ENCOUNTER — Encounter (INDEPENDENT_AMBULATORY_CARE_PROVIDER_SITE_OTHER): Payer: Medicare PPO | Admitting: Ophthalmology

## 2022-08-16 DIAGNOSIS — H35373 Puckering of macula, bilateral: Secondary | ICD-10-CM

## 2022-08-16 DIAGNOSIS — H30893 Other chorioretinal inflammations, bilateral: Secondary | ICD-10-CM | POA: Diagnosis not present

## 2022-08-16 DIAGNOSIS — H269 Unspecified cataract: Secondary | ICD-10-CM

## 2022-08-16 DIAGNOSIS — H33102 Unspecified retinoschisis, left eye: Secondary | ICD-10-CM | POA: Diagnosis not present

## 2022-08-16 NOTE — Progress Notes (Signed)
[facility] - Clinic Note  08/16/2022     CHIEF COMPLAINT Patient presents for Retina Follow Up   HISTORY OF PRESENT ILLNESS: [name] is a 52 y.o. female who presents to the clinic today for:   HPI     Retina Follow Up   Patient presents with  Other.  In both eyes.  This started 1 year ago.  I, the attending physician,  performed the HPI with the patient and updated documentation appropriately.        Comments   Patient here for 1 year retina follow up for granulomatous chorioretinitis OU. Patient states vision doing pretty good. No eye pain. Saw Dr Dione Booze. He thinks will do well if had cataract surgery. Asking questions since this with retina is inactive will cataract surgery make it active? And had other questions.      Last edited by Rennis [name], MD on 08/16/2022  5:28 PM.    Pts mother states pt has been complaining about not being able to see up close, she states Dr. Dione Booze said she might benefit from cataract sx also  Referring physician: Sallye Lat, MD [address] [place],  [place] [address]   HISTORICAL INFORMATION:   Selected notes from the MEDICAL RECORD NUMBER Referred by Dr. Dione Booze LEE:  Ocular Hx- granulomatous chorioretinitis OU -- formerly followed by Dr. Everlena Cooper annually -- pt stable and wanted to transfer ophthalmologic care closer to home PMH-previously followed by Dr. Ashley Royalty (05.15.12) and Dr. Everlena Cooper at [facility] (02.11.22)    CURRENT MEDICATIONS: No current outpatient medications on file. (Ophthalmic Drugs)   No current facility-administered medications for this visit. (Ophthalmic Drugs)   Current Outpatient Medications (Other)  Medication Sig   acetaminophen (TYLENOL) 650 MG CR tablet Take 1,300 mg by mouth every 8 (eight) hours as needed for pain.   allopurinol (ZYLOPRIM) 100 MG tablet Take 1 tablet (100 mg total) by mouth at bedtime.   aspirin EC 81 MG tablet Take 1 tablet (81 mg total) by mouth daily.  Swallow whole.   clobetasol cream (TEMOVATE) 0.05 % APPLY TO SKIN RASH 2 TIMES DAILY AS NEEDED. MAX 2 WEEKS AT A TIME, AVOID FACE, UNDERARMS, GROIN.   Ibuprofen 200 MG CAPS Take 3 capsules (600 mg total) by mouth 2 (two) times daily as needed.   levETIRAcetam (KEPPRA) 500 MG tablet TAKE 1 TABLET BY MOUTH TWICE A DAY   levothyroxine (SYNTHROID) 125 MCG tablet Take 1 tablet (125 mcg total) by mouth daily before breakfast.   memantine (NAMENDA) 10 MG tablet TAKE 1 TABLET BY MOUTH TWICE A DAY   Multiple Vitamins-Minerals (WOMENS MULTI PO) Take 1 tablet by mouth at bedtime.   silver sulfADIAZINE (SILVADENE) 1 % cream Apply 1 application. topically daily.   traMADol (ULTRAM) 50 MG tablet TAKE 1 TABLET (50 MG TOTAL) BY MOUTH TWICE A DAY AS NEEDED FOR MODERATE PAIN   No current facility-administered medications for this visit. (Other)   REVIEW OF SYSTEMS: ROS   Positive for: Neurological, Eyes Negative for: Constitutional, Gastrointestinal, Skin, Genitourinary, Musculoskeletal, HENT, Cardiovascular, Respiratory, Allergic/Imm, Heme/Lymph Last edited by Laddie Aquas, COA on 08/16/2022 10:09 AM.     ALLERGIES No Known Allergies  PAST MEDICAL HISTORY Past Medical History:  Diagnosis Date   Bruise    per pt mother bruising left leg post op laser procedure on 10-25-2021   Chronic idiopathic gout of knee without tophus    followed by pcp----   affects left knee (  10-28-2021  per pt mother last flare-up several yrs ago)   Dementia due to another medical condition [facility])    neurologist--- dr [name];   secondary to down syndrome (Trisomy 21-related),  nl CT head 2010, did not tolerate aricept   Down syndrome    Eczema    Edema of both lower extremities    Endometrial hyperplasia    H/O pleural empyema 09/24/2011   acute  in setting CAP /   s/p left  VATS with drainage empyema,  post op acute respiratory failure --- resolved   History of DVT of lower extremity 05/11/2021   dx left lower extremity  popliteal vein,  completed 3 months eliquis then changed to ASA 81mg    History of recurrent pneumonia    multiple pneumonias   Hypothyroidism    MDD (major depressive disorder)    OA (osteoarthritis)    bilat knee arthritis, had 3/3 Supartz injections [name])   OCD (obsessive compulsive disorder)    OSA on CPAP 05/2012   folowed by dr Craige Cotta---- study in epic, severe OSA with AHI 118, desat nadir to 67%   (per pt mother pt uses nightly)   Pre-diabetes    Seizure disorder [facility]) 2013   neurologist--- dr [name];   (10-28-2021  per pt mother no seizure since 2013)    long hospitalization, x 2 ; "very mild"; during hospitalization "while coming off vent"   Varicose veins of both lower extremities    followed by dr j. dew ---   with venous reflux,   10-25-2021  s/p laser procedure left leg GSV and SF   Wears glasses    Past Surgical History:  Procedure Laterality Date   ACHILLES TENDON REPAIR Right 01/2017   Dr. [facility]   ACHILLES TENDON REPAIR Right 01/2017   ANKLE ARTHROSCOPY Right 11/11/2020   @[facility]  by dr Logan Bores;    debridement and stablilzation lateral collateral ligament   ENDOSCOPIC VEIN LASER TREATMENT Left 10/25/2021   by dr j. dew;   left leg GSV and SF for venous reflux   FLEXIBLE BRONCHOSCOPY  09/24/2011   Procedure: FLEXIBLE BRONCHOSCOPY;  Surgeon: Purcell Nails, MD;  Location: [facility] OR;  Service: Thoracic;  Laterality: N/A;   HYSTEROSCOPY WITH D & C N/A 09/27/2021   Procedure: DILATATION AND CURETTAGE /HYSTEROSCOPY;  Surgeon: Reva Bores, MD;  Location: [facility] OR;  Service: Gynecology;  Laterality: N/A;   INTRAUTERINE DEVICE (IUD) INSERTION N/A 11/07/2021   Procedure: INTRAUTERINE DEVICE (IUD) INSERTION;  Surgeon: Reva Bores, MD;  Location: [facility];  Service: Gynecology;  Laterality: N/A;   LUMBAR DISC SURGERY  07/18/2004   @[facility]  by dr Wynetta Emery;   L4--5 disectomy and left L3--4 nerve decompression   TRACHEOSTOMY TUBE PLACEMENT  10/11/2011    Procedure: TRACHEOSTOMY;  Surgeon: Serena Colonel, MD;  Location: [facility] OR;  Service: ENT;  Laterality: N/A;   VIDEO ASSISTED THORACOSCOPY (VATS) /THOROCOTOMY/RADIOACTIVE SEED IMPLANT Left 09/24/2011   VATS, (Dr. Barry Dienes)   FAMILY HISTORY Family History  Problem Relation Age of Onset   Arthritis Mother    Hypertension Father    Diabetes Father    Cancer Neg Hx    Coronary artery disease Neg Hx    Stroke Neg Hx    SOCIAL HISTORY Social History   Tobacco Use   Smoking status: Never   Smokeless tobacco: Never  Vaping Use   Vaping Use: Never used  Substance Use Topics   Alcohol use: Never   Drug use:  Never       OPHTHALMIC EXAM:  Base Eye Exam     Visual Acuity (Snellen - Linear)       Right Left   Dist cc 20/50 -2 20/50 +2    Correction: Glasses         Tonometry (Tonopen, 10:03 AM)       Right Left   Pressure 20 17         Pupils       Dark Light Shape React APD   Right 3 2 Round Brisk None   Left 3 2 Round Brisk None         Visual Fields (Counting fingers)       Left Right    Full Full         Extraocular Movement       Right Left    Full, Ortho Full, Ortho         Neuro/Psych     Oriented x3: Yes   Mood/Affect: Normal         Dilation     Both eyes: 1.0% Mydriacyl, 2.5% Phenylephrine @ 10:03 AM           Slit Lamp and Fundus Exam     Slit Lamp Exam       Right Left   Lids/Lashes Dermatochalasis - upper lid Dermatochalasis - upper lid   Conjunctiva/Sclera White and quiet White and quiet   Cornea trace PEE 1+ Punctate epithelial erosions   Anterior Chamber Deep and clear, narrow temporal angle, no cell or flare Deep and clear, narrow temporal angle, no cell or flare   Iris Round and dilated Round and dilated   Lens 1-2+ Cortical cataract, pigment on anterior capsule, trace Posterior subcapsular cataract 1+ Cortical cataract   Anterior Vitreous mild syneresis, no cell or pigment mild syneresis, no cell or pigment          Fundus Exam       Right Left   Disc 2+mild Pallor, Sharp rim, +PPA/PPP Pink and sharp, +PPA/PPP   C/D Ratio 0.3 0.3   Macula Flat, Blunted foveal reflex, scattered drusen, RPE mottling and clumping Flat, Blunted foveal reflex, mild ERM, RPE mottling, no heme or edema   Vessels attenuated, Tortuous, +pigment clumping along IT arcades attenuated, scattered patches of perivascular pigment clumping   Periphery Attached, peripapillary CR atrophy with pigment clumping extending along arcades, scattered pigmented CR scarring, +drusen, reticular degeneration, No RT/RD, ?shallow schisis IT periphery Attached, peripheral bone spicules ST quad, bullous schisis along IT arcades from 0400-0530 -- posterior progression compared to prior, reticular degeneration           Refraction     Wearing Rx       Sphere Cylinder Axis   Right -1.00 +3.00 056   Left +1.25 +1.00 153            IMAGING AND PROCEDURES  Imaging and Procedures for 08/16/2022  OCT, Retina - OU - Both Eyes       Right Eye Quality was good. Central Foveal Thickness: 321. Progression has been stable. Findings include no IRF, no SRF, abnormal foveal contour, retinal drusen , epiretinal membrane, macular pucker (ERM with blunted foveal contour, mild pucker and trace cysti changes).   Left Eye Quality was good. Central Foveal Thickness: 343. Progression has worsened. Findings include no IRF, no SRF, abnormal foveal contour, myopic contour, epiretinal membrane (Mild ERM with blunted foveal contour, bullous schisis IT periphery caught on widefield --  posterior progression).   Notes *Images captured and stored on drive  Diagnosis / Impression:  OD: ERM with blunted foveal contour, mild pucker and trace cysti changes OS: Mild ERM with blunted foveal contour, bullous schisis IT periphery caught on widefield -- posterior progression  Clinical management:  See below  Abbreviations: NFP - Normal foveal profile. CME - cystoid  macular edema. PED - pigment epithelial detachment. IRF - intraretinal fluid. SRF - subretinal fluid. EZ - ellipsoid zone. ERM - epiretinal membrane. ORA - outer retinal atrophy. ORT - outer retinal tubulation. SRHM - subretinal hyper-reflective material. IRHM - intraretinal hyper-reflective material     Color Fundus Photography Optos - OU - Both Eyes       Right Eye Progression has been stable. Disc findings include pallor (peripapillary atrophy w/ bone spicules). Macula : normal observations. Vessels : attenuated. Periphery : degeneration, RPE abnormality (scattered patches of peripheral pigmented CR scarring and atrophy).   Left Eye Progression has worsened. Disc findings include pallor (Mild PPA). Macula : normal observations. Vessels : attenuated. Periphery : degeneration, RPE abnormality (bullous retinoschisis IT periphery -- posterior progression compared to 2023 images, focal patch of pigmented CR scarring and atrophy ST periphery ).   Notes **Images stored on drive**  Impression: Chorioretinis OU -- scattered patches of pigmented CR scarring and atrophy OU OD: peripapillary atrophy w/ bone spicules, scattered patches of peripheral pigmented CR scarring and atrophy OS: bullous retinoschisis IT periphery -- posterior progression compared to 2023 images; mild PPA, focal patch of pigmented CR scarring and atrophy ST periphery            ASSESSMENT/PLAN:    ICD-10-CM   1. Granulomatous chorioretinitis of both eyes  H30.893 OCT, Retina - OU - Both Eyes    Color Fundus Photography Optos - OU - Both Eyes    CANCELED: Fluorescein Angiography Optos (Transit OD)    2. Left retinoschisis  H33.102 OCT, Retina - OU - Both Eyes    Color Fundus Photography Optos - OU - Both Eyes    3. Epiretinal membrane (ERM) of both eyes  H35.373 OCT, Retina - OU - Both Eyes    4. Cortical cataract of both eyes  H26.9      1. Chorioretinitis OU - pt of Dr. Sharee Pimple at [facility], originally  referred by [facility] in 2015 - pt has been stable for last several years without active inflammation on no medicationsand has been monitored annually - due to recent stability, pt and family requested retina follow up closer to home - BCVA 20/50 OD, 20/50 OS -- subjectively and objectively stable ([facility] at [facility] 03/2020 was 20/64 OU) - exam shows pigmented CR scarring OU -- no signs of active inflammation - unable to check FA -- no vein access - no retinal or ophthalmic interventions indicated or recommended   2. Retinoschisis OS  - bullous retinoschisis IT periphery OU w/ posterior extension just outside IT arcades -- posterior progression noted today  - no associated RT/RD OS  - ?shallow schisis IT periphery OD  - discussed findings and progression  - recommend laser retinopexy OD due to progressive schisis encroaching on macula  - f/u week of July 22, possible laser retinopexy OD  - discussed that if unable to complete laser in office, may need to go to OR  3. Epiretinal membrane, both eyes -- stable - BCVA 20/50 OD, 20/50 OS - asymptomatic, no metamorphopsia - no indication for surgery at this time - monitor for now - f/u  3 mos -- DFE/OCT  4. Cortical Cataract OU - The symptoms of cataract, surgical options, and treatments and risks were discussed with patient. - discussed diagnosis and progression - not yet visually significant - monitor for now   Ophthalmic Meds Ordered this visit:  No orders of the defined types were placed in this encounter.    Return for f/u week of July 22, retinoschisis OS, DFE, OCT, possible laser retinopexy.  There are no Patient Instructions on file for this visit.   Explained the diagnoses, plan, and follow up with the patient and they expressed understanding.  Patient expressed understanding of the importance of proper follow up care.   This document serves as a record of services personally performed by Karie Chimera, MD, PhD. It was created on  their behalf by Gerilyn Nestle, COT an ophthalmic technician. The creation of this record is the provider's dictation and/or activities during the visit.    Electronically signed by:  Charlette Caffey, COT  08/16/22 5:37 PM  This document serves as a record of services personally performed by Karie Chimera, MD, PhD. It was created on their behalf by Glee Arvin. Manson Passey, OA an ophthalmic technician. The creation of this record is the provider's dictation and/or activities during the visit.    Electronically signed by: Glee Arvin. Kristopher Oppenheim 06.19.2024 5:37 PM  Karie Chimera, M.D., Ph.D. Diseases & Surgery of the Retina and Vitreous [facility]  I have reviewed the above documentation for accuracy and completeness, and I agree with the above. Karie Chimera, M.D., Ph.D. 08/16/22 5:42 PM   Abbreviations: M myopia (nearsighted); A astigmatism; H hyperopia (farsighted); P presbyopia; Mrx spectacle prescription;  CTL contact lenses; OD right eye; OS left eye; OU both eyes  XT exotropia; ET esotropia; PEK punctate epithelial keratitis; PEE punctate epithelial erosions; DES dry eye syndrome; MGD meibomian gland dysfunction; ATs artificial tears; PFAT's preservative free artificial tears; NSC nuclear sclerotic cataract; PSC posterior subcapsular cataract; ERM epi-retinal membrane; PVD posterior vitreous detachment; RD retinal detachment; DM diabetes mellitus; DR diabetic retinopathy; NPDR non-proliferative diabetic retinopathy; PDR proliferative diabetic retinopathy; CSME clinically significant macular edema; DME diabetic macular edema; dbh dot blot hemorrhages; CWS cotton wool spot; POAG primary open angle glaucoma; C/D cup-to-disc ratio; HVF humphrey visual field; GVF goldmann visual field; OCT optical coherence tomography; IOP intraocular pressure; BRVO Branch retinal vein occlusion; CRVO central retinal vein occlusion; CRAO central retinal artery occlusion; BRAO branch retinal  artery occlusion; RT retinal tear; SB scleral buckle; PPV pars plana vitrectomy; VH Vitreous hemorrhage; PRP panretinal laser photocoagulation; IVK intravitreal kenalog; VMT vitreomacular traction; MH Macular hole;  NVD neovascularization of the disc; NVE neovascularization elsewhere; AREDS age related eye disease study; ARMD age related macular degeneration; POAG primary open angle glaucoma; EBMD epithelial/anterior basement membrane dystrophy; ACIOL anterior chamber intraocular lens; IOL intraocular lens; PCIOL posterior chamber intraocular lens; Phaco/IOL phacoemulsification with intraocular lens placement; PRK photorefractive keratectomy; LASIK laser assisted in situ keratomileusis; HTN hypertension; DM diabetes mellitus; COPD chronic obstructive pulmonary disease

## 2022-08-16 NOTE — Telephone Encounter (Signed)
Patient mother left a message stating that Jania was having severe leg pain. I left a message on patient mother voicemail to return a call so we could received more information.

## 2022-08-22 ENCOUNTER — Other Ambulatory Visit (INDEPENDENT_AMBULATORY_CARE_PROVIDER_SITE_OTHER): Payer: Self-pay | Admitting: Nurse Practitioner

## 2022-08-22 DIAGNOSIS — M7989 Other specified soft tissue disorders: Secondary | ICD-10-CM

## 2022-08-22 DIAGNOSIS — L819 Disorder of pigmentation, unspecified: Secondary | ICD-10-CM

## 2022-08-23 ENCOUNTER — Ambulatory Visit: Payer: Medicare PPO | Admitting: Podiatry

## 2022-08-29 ENCOUNTER — Ambulatory Visit
Admission: RE | Admit: 2022-08-29 | Discharge: 2022-08-29 | Disposition: A | Discharge status: Home / Self Care | Payer: Medicare PPO | Source: Ambulatory Visit | Attending: Family Medicine | Admitting: Family Medicine

## 2022-08-29 DIAGNOSIS — Z1231 Encounter for screening mammogram for malignant neoplasm of breast: Secondary | ICD-10-CM | POA: Diagnosis not present

## 2022-08-30 ENCOUNTER — Ambulatory Visit (INDEPENDENT_AMBULATORY_CARE_PROVIDER_SITE_OTHER): Payer: Medicare PPO | Admitting: Nurse Practitioner

## 2022-08-30 ENCOUNTER — Encounter (INDEPENDENT_AMBULATORY_CARE_PROVIDER_SITE_OTHER): Payer: Self-pay | Admitting: Nurse Practitioner

## 2022-08-30 ENCOUNTER — Ambulatory Visit (INDEPENDENT_AMBULATORY_CARE_PROVIDER_SITE_OTHER): Payer: Medicare PPO

## 2022-08-30 VITALS — BP 117/80 | HR 74 | Resp 18 | Ht <= 58 in | Wt 245.4 lb

## 2022-08-30 DIAGNOSIS — I890 Lymphedema, not elsewhere classified: Secondary | ICD-10-CM

## 2022-08-30 DIAGNOSIS — I83893 Varicose veins of bilateral lower extremities with other complications: Secondary | ICD-10-CM

## 2022-08-30 DIAGNOSIS — L819 Disorder of pigmentation, unspecified: Secondary | ICD-10-CM | POA: Diagnosis not present

## 2022-08-30 DIAGNOSIS — M7989 Other specified soft tissue disorders: Secondary | ICD-10-CM

## 2022-08-31 ENCOUNTER — Encounter (INDEPENDENT_AMBULATORY_CARE_PROVIDER_SITE_OTHER): Payer: Self-pay | Admitting: Nurse Practitioner

## 2022-08-31 NOTE — Progress Notes (Signed)
Subjective:    Patient ID: [name], female    DOB: [birth year], 52 y.o.   MRN: [id number] Chief Complaint  Patient presents with   Follow-up    Room 4 legs having some swelling and discoloration    [name] is a 52 year old female who presents today due to worsening swelling and pain in her left lower extremity.  Her mother helps provide much of the history today.  She notes that this pain and swelling has been worsening over the last few months.  She previously had an ablation of her left great saphenous vein on 10/25/2021.  Her mother notes that it did help pain and swelling during that time.  Currently the patient is diligent with wearing her medical grade compression socks daily.  She also has a lymphedema pump however she does not utilize this.  Currently no evidence of cellulitis or open wounds or ulcerations.  Today noninvasive studies show no evidence of DVT or superficial phlebitis.  There is deep venous insufficiency noted.  The patient has reflux in her great saphenous vein extending from the saphenofemoral junction to the proximal calf.  This is a notable difference from her post ablation studies which indicates likely recannulization of the vein.  There is also reflux in the small saphenous vein.    Review of Systems  Cardiovascular:  Positive for leg swelling.  All other systems reviewed and are negative.      Objective:   Physical Exam Vitals reviewed.  HENT:     Head: Normocephalic.  Cardiovascular:     Rate and Rhythm: Normal rate.  Pulmonary:     Effort: Pulmonary effort is normal.  Musculoskeletal:     Right lower leg: Edema present.     Left lower leg: Edema present.  Skin:    General: Skin is warm and dry.  Neurological:     Mental Status: She is alert and oriented to person, place, and time.  Psychiatric:        Mood and Affect: Mood normal.        Behavior: Behavior normal.     BP 117/80 (BP Location: Left Wrist)   Pulse 74   Resp 18   Ht  4\' 8"  (1.422 m)   Wt 245 lb 6.4 oz (111.3 kg)   LMP  (LMP Unknown)   BMI 55.02 kg/m   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 [facility])    neurologist--- dr [name];   secondary to down syndrome (Trisomy 21-related),  nl CT head 2010, did not tolerate aricept   Down syndrome    Eczema    Edema of both lower extremities    Endometrial hyperplasia    H/O pleural empyema 09/24/2011   acute  in setting CAP /   s/p left  VATS with drainage empyema,  post op acute respiratory failure --- resolved   History of DVT of lower extremity 05/11/2021   dx left lower extremity popliteal vein,  completed 3 months eliquis then changed to ASA 81mg    History of recurrent pneumonia    multiple pneumonias   Hypothyroidism    MDD (major depressive disorder)    OA (osteoarthritis)  bilat knee arthritis, had 3/3 Supartz injections [name])   OCD (obsessive compulsive disorder)    OSA on CPAP 05/2012   folowed by dr Craige Cotta---- study in epic, severe OSA with AHI 118, desat nadir to 67%   (per pt mother pt uses nightly)   Pre-diabetes    Seizure disorder [facility]) 2013   neurologist--- dr [name];   (10-28-2021  per pt mother no seizure since 2013)    long hospitalization, x 2 ; "very mild"; during hospitalization "while coming off vent"   Varicose veins of both lower extremities    followed by dr j. dew ---   with venous reflux,   10-25-2021  s/p laser procedure left leg GSV and SF   Wears glasses     Social History   Socioeconomic History   Marital status: Single    Spouse name: Not on file   Number of children: 0   Years of education: Not on file   Highest education level: Not on file  Occupational History   Not on file  Tobacco Use    Smoking status: Never   Smokeless tobacco: Never  Vaping Use   Vaping Use: Never used  Substance and Sexual Activity   Alcohol use: Never   Drug use: Never   Sexual activity: Never    Birth control/protection: None  Other Topics Concern   Not on file  Social History Narrative   Lives with mom and dad, dogs   Stays at daycare during the day   Activity: walking some   Diet: good water, fruits/vegetables daily   Social Determinants of Health   Financial Resource Strain: Low Risk  (10/20/2021)   Overall Financial Resource Strain (CARDIA)    Difficulty of Paying Living Expenses: Not hard at all  Food Insecurity: No Food Insecurity (10/20/2021)   Hunger Vital Sign    Worried About Running Out of Food in the Last Year: Never true    Ran Out of Food in the Last Year: Never true  Transportation Needs: No Transportation Needs (10/20/2021)   PRAPARE - Administrator, Civil Service (Medical): No    Lack of Transportation (Non-Medical): No  Physical Activity: Inactive (10/20/2021)   Exercise Vital Sign    Days of Exercise per Week: 0 days    Minutes of Exercise per Session: 0 min  Stress: No Stress Concern Present (10/20/2021)   [employer] of Occupational Health - Occupational Stress Questionnaire    Feeling of Stress : Only a little  Social Connections: Socially Isolated (10/20/2021)   Social Connection and Isolation Panel [NHANES]    Frequency of Communication with Friends and Family: Twice a week    Frequency of Social Gatherings with Friends and Family: Three times a week    Attends Religious Services: Never    Active Member of Clubs or Organizations: No    Attends Banker Meetings: Never    Marital Status: Never married  Intimate Partner Violence: Not At Risk (10/20/2021)   Humiliation, Afraid, Rape, and Kick questionnaire    Fear of Current or Ex-Partner: No    Emotionally Abused: No    Physically Abused: No    Sexually Abused: No    Past  Surgical History:  Procedure Laterality Date   ACHILLES TENDON REPAIR Right 01/2017   Dr[facility]   ACHILLES TENDON REPAIR Right 01/2017   ANKLE ARTHROSCOPY Right 11/11/2020   @[facility]  by dr Logan Bores;    debridement and stablilzation lateral collateral ligament  ENDOSCOPIC VEIN LASER TREATMENT Left 10/25/2021   by dr j. dew;   left leg GSV and SF for venous reflux   FLEXIBLE BRONCHOSCOPY  09/24/2011   Procedure: FLEXIBLE BRONCHOSCOPY;  Surgeon: Purcell Nails, MD;  Location: [facility] OR;  Service: Thoracic;  Laterality: N/A;   HYSTEROSCOPY WITH D & C N/A 09/27/2021   Procedure: DILATATION AND CURETTAGE /HYSTEROSCOPY;  Surgeon: Reva Bores, MD;  Location: [facility] OR;  Service: Gynecology;  Laterality: N/A;   INTRAUTERINE DEVICE (IUD) INSERTION N/A 11/07/2021   Procedure: INTRAUTERINE DEVICE (IUD) INSERTION;  Surgeon: Reva Bores, MD;  Location: [facility];  Service: Gynecology;  Laterality: N/A;   LUMBAR DISC SURGERY  07/18/2004   @[facility]  by dr Wynetta Emery;   L4--5 disectomy and left L3--4 nerve decompression   TRACHEOSTOMY TUBE PLACEMENT  10/11/2011   Procedure: TRACHEOSTOMY;  Surgeon: Serena Colonel, MD;  Location: [facility] OR;  Service: ENT;  Laterality: N/A;   VIDEO ASSISTED THORACOSCOPY (VATS) /THOROCOTOMY/RADIOACTIVE SEED IMPLANT Left 09/24/2011   VATS, (Dr. Barry Dienes)    Family History  Problem Relation Age of Onset   Arthritis Mother    Hypertension Father    Diabetes Father    Cancer Neg Hx    Coronary artery disease Neg Hx    Stroke Neg Hx     No Known Allergies     Latest Ref Rng & Units 01/10/2022    7:59 AM 11/07/2021    8:00 AM 09/27/2021    1:06 PM  CBC  WBC 4.0 - 10.5 K/uL 4.3  4.8  4.9   Hemoglobin 12.0 - 15.0 g/dL 16.1  09.6  04.5   Hematocrit 36.0 - 46.0 % 41.5  44.0  40.6   Platelets 150.0 - 400.0 K/uL 200.0  169  220       CMP     Component Value Date/Time   NA 138 01/10/2022 0759   K 4.4 01/10/2022 0759   CL 102 01/10/2022 0759   CO2 31 01/10/2022  0759   GLUCOSE 98 01/10/2022 0759   BUN 21 01/10/2022 0759   CREATININE 0.97 01/10/2022 0759   CALCIUM 8.9 01/10/2022 0759   PROT 7.0 01/10/2022 0759   ALBUMIN 4.1 01/10/2022 0759   AST 18 01/10/2022 0759   ALT 17 01/10/2022 0759   ALKPHOS 50 01/10/2022 0759   BILITOT 0.2 01/10/2022 0759   GFR 67.74 01/10/2022 0759   GFRNONAA >60 10/06/2019 1655     No results found.     Assessment & Plan:   1. Varicose veins of leg with swelling, bilateral I suspect that the reason for the patient's sudden worsening swelling and discomfort is due to the fact that it appears her great saphenous vein has recannulized.  The patient did not tolerate the endovenous laser ablation well at all.  She like to avoid redoing the ablation if  possible.  Based on this the patient is advised to follow conservative therapies as noted below.  2. Lymphedema The patient does have noted lymphedema even in the setting of her venous insufficiency.  She wears her compression socks diligently but she does not utilize her lymphedema pump.  She is advised to begin utilizing at least 30 minutes daily progressing up to an hour in the evenings.  I suspect that with consistent use this actually may help improving control her swelling.   Current Outpatient Medications on File Prior to Visit  Medication Sig Dispense Refill   allopurinol (ZYLOPRIM) 100 MG tablet Take 1 tablet (100  mg total) by mouth at bedtime. 90 tablet 3   aspirin EC 81 MG tablet Take 1 tablet (81 mg total) by mouth daily. Swallow whole.     clobetasol cream (TEMOVATE) 0.05 % APPLY TO SKIN RASH 2 TIMES DAILY AS NEEDED. MAX 2 WEEKS AT A TIME, AVOID FACE, UNDERARMS, GROIN. 45 g 0   Ibuprofen 200 MG CAPS Take 3 capsules (600 mg total) by mouth 2 (two) times daily as needed.     levETIRAcetam (KEPPRA) 500 MG tablet TAKE 1 TABLET BY MOUTH TWICE A DAY 180 tablet 3   levothyroxine (SYNTHROID) 125 MCG tablet Take 1 tablet (125 mcg total) by mouth daily before  breakfast. 90 tablet 3   memantine (NAMENDA) 10 MG tablet TAKE 1 TABLET BY MOUTH TWICE A DAY 180 tablet 3   Multiple Vitamins-Minerals (WOMENS MULTI PO) Take 1 tablet by mouth at bedtime.     silver sulfADIAZINE (SILVADENE) 1 % cream Apply 1 application. topically daily. 50 g 0   traMADol (ULTRAM) 50 MG tablet TAKE 1 TABLET (50 MG TOTAL) BY MOUTH TWICE A DAY AS NEEDED FOR MODERATE PAIN 30 tablet 0   acetaminophen (TYLENOL) 650 MG CR tablet Take 1,300 mg by mouth every 8 (eight) hours as needed for pain. (Patient not taking: Reported on 08/30/2022)     No current facility-administered medications on file prior to visit.    There are no Patient Instructions on file for this visit. No follow-ups on file.   Georgiana Spinner, NP

## 2022-09-01 DIAGNOSIS — G4733 Obstructive sleep apnea (adult) (pediatric): Secondary | ICD-10-CM | POA: Diagnosis not present

## 2022-09-11 ENCOUNTER — Ambulatory Visit: Payer: Medicare PPO | Admitting: Podiatry

## 2022-09-11 ENCOUNTER — Encounter: Payer: Self-pay | Admitting: Podiatry

## 2022-09-11 DIAGNOSIS — I890 Lymphedema, not elsewhere classified: Secondary | ICD-10-CM | POA: Diagnosis not present

## 2022-09-11 DIAGNOSIS — M79675 Pain in left toe(s): Secondary | ICD-10-CM

## 2022-09-11 DIAGNOSIS — M79674 Pain in right toe(s): Secondary | ICD-10-CM | POA: Diagnosis not present

## 2022-09-11 DIAGNOSIS — B351 Tinea unguium: Secondary | ICD-10-CM | POA: Diagnosis not present

## 2022-09-11 NOTE — Progress Notes (Signed)
This patient returns to my office for at risk foot care.  This patient requires this care by a professional since this patient will be at risk due to having venous stasis and diabetes.  This patient is unable to cut nails herself since the patient cannot reach her nails.These nails are painful walking and wearing shoes.  This patient presents for at risk foot care today.  General Appearance  Alert, conversant and in no acute stress.  Vascular  Dorsalis pedis and posterior tibial  pulses are not palpable due to swelling. bilaterally.  Capillary return is within normal limits  bilaterally. Temperature is within normal limits  bilaterally. Venous stasis legs  B/l.  Neurologic  Senn-Weinstein monofilament wire test within normal limits  bilaterally. Muscle power within normal limits bilaterally.  Nails Thick disfigured discolored nails with subungual debris  from hallux to fifth toes bilaterally. No evidence of bacterial infection or drainage bilaterally.  Orthopedic  No limitations of motion  feet .  No crepitus or effusions noted.  No bony pathology or digital deformities noted.  Skin  normotropic skin with no porokeratosis noted bilaterally.  No signs of infections or ulcers noted.     Onychomycosis  Pain in right toes  Pain in left toes  Consent was obtained for treatment procedures.   Mechanical debridement of nails 1-5  bilaterally performed with a nail nipper.  Filed with dremel without incident.    Return office visit   3  months                   Told patient to return for periodic foot care and evaluation due to potential at risk complications.   Gardiner Barefoot DPM

## 2022-09-18 ENCOUNTER — Encounter (INDEPENDENT_AMBULATORY_CARE_PROVIDER_SITE_OTHER): Payer: Medicare PPO | Admitting: Ophthalmology

## 2022-09-18 DIAGNOSIS — H269 Unspecified cataract: Secondary | ICD-10-CM

## 2022-09-18 DIAGNOSIS — H35373 Puckering of macula, bilateral: Secondary | ICD-10-CM

## 2022-09-18 DIAGNOSIS — H33102 Unspecified retinoschisis, left eye: Secondary | ICD-10-CM

## 2022-09-18 DIAGNOSIS — H30893 Other chorioretinal inflammations, bilateral: Secondary | ICD-10-CM

## 2022-09-19 ENCOUNTER — Encounter (INDEPENDENT_AMBULATORY_CARE_PROVIDER_SITE_OTHER): Payer: Medicare PPO | Admitting: Ophthalmology

## 2022-09-20 ENCOUNTER — Ambulatory Visit (INDEPENDENT_AMBULATORY_CARE_PROVIDER_SITE_OTHER): Payer: Medicare PPO | Admitting: Nurse Practitioner

## 2022-09-21 ENCOUNTER — Other Ambulatory Visit: Payer: Self-pay | Admitting: Family Medicine

## 2022-09-21 DIAGNOSIS — L301 Dyshidrosis [pompholyx]: Secondary | ICD-10-CM

## 2022-09-21 NOTE — Progress Notes (Signed)
[facility] - Clinic Note  09/28/2022     CHIEF COMPLAINT Patient presents for Retina Follow Up   HISTORY OF PRESENT ILLNESS: [name] is a 52 y.o. female who presents to the clinic today for:   HPI     Retina Follow Up   This started 6 weeks ago.  Duration of 6 weeks.  Since onset it is stable.  I, the attending physician,  performed the HPI with the patient and updated documentation appropriately.        Comments   6 week retina follow up retinoschisis os and retinopexy today pt is reporting no vision changes she denies any flashes or floaters pt is a poor historian        Last edited by Rennis [name], MD on 09/28/2022  3:01 PM.     Retinopexy OS today  Referring physician: Sallye Lat, MD [address] [place],  [place] [address]   HISTORICAL INFORMATION:   Selected notes from the MEDICAL RECORD NUMBER Referred by Dr. Dione Booze LEE:  Ocular Hx- granulomatous chorioretinitis OU -- formerly followed by Dr. Everlena Cooper annually -- pt stable and wanted to transfer ophthalmologic care closer to home PMH-previously followed by Dr. Ashley Royalty (05.15.12) and Dr. Everlena Cooper at [facility] (02.11.22)    CURRENT MEDICATIONS: Current Outpatient Medications (Ophthalmic Drugs)  Medication Sig   prednisoLONE acetate (PRED FORTE) 1 % ophthalmic suspension Place 1 drop into the left eye 4 (four) times daily for 7 days.   No current facility-administered medications for this visit. (Ophthalmic Drugs)   Current Outpatient Medications (Other)  Medication Sig   acetaminophen (TYLENOL) 650 MG CR tablet Take 1,300 mg by mouth every 8 (eight) hours as needed for pain.   allopurinol (ZYLOPRIM) 100 MG tablet Take 1 tablet (100 mg total) by mouth at bedtime.   aspirin EC 81 MG tablet Take 1 tablet (81 mg total) by mouth daily. Swallow whole.   clobetasol cream (TEMOVATE) 0.05 % APPLY TO SKIN RASH 2 TIMES DAILY AS NEEDED. MAX 2 WEEKS AT A TIME, AVOID FACE, UNDERARMS, GROIN.    Ibuprofen 200 MG CAPS Take 3 capsules (600 mg total) by mouth 2 (two) times daily as needed.   levETIRAcetam (KEPPRA) 500 MG tablet TAKE 1 TABLET BY MOUTH TWICE A DAY   levothyroxine (SYNTHROID) 125 MCG tablet TAKE 1 TABLET BY MOUTH EVERY DAY BEFORE BREAKFAST   memantine (NAMENDA) 10 MG tablet TAKE 1 TABLET BY MOUTH TWICE A DAY   Multiple Vitamins-Minerals (WOMENS MULTI PO) Take 1 tablet by mouth at bedtime.   silver sulfADIAZINE (SILVADENE) 1 % cream Apply 1 application. topically daily.   traMADol (ULTRAM) 50 MG tablet TAKE 1 TABLET (50 MG TOTAL) BY MOUTH TWICE A DAY AS NEEDED FOR MODERATE PAIN   No current facility-administered medications for this visit. (Other)   REVIEW OF SYSTEMS: ROS   Positive for: Neurological, Eyes Negative for: Constitutional, Gastrointestinal, Skin, Genitourinary, Musculoskeletal, HENT, Cardiovascular, Respiratory, Allergic/Imm, Heme/Lymph Last edited by [name], COT on 09/28/2022  9:26 AM.     ALLERGIES No Known Allergies  PAST MEDICAL HISTORY 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 (HCC)  neurologist--- dr [name];   secondary to down syndrome (Trisomy 21-related),  nl CT head 2010, did not tolerate aricept   Down syndrome    Eczema    Edema of both lower extremities    Endometrial hyperplasia    H/O pleural empyema 09/24/2011   acute  in setting CAP /   s/p left  VATS with drainage empyema,  post op acute respiratory failure --- resolved   History of DVT of lower extremity 05/11/2021   dx left lower extremity popliteal vein,  completed 3 months eliquis then changed to ASA 81mg    History of recurrent pneumonia    multiple pneumonias   Hypothyroidism    MDD  (major depressive disorder)    OA (osteoarthritis)    bilat knee arthritis, had 3/3 Supartz injections [name])   OCD (obsessive compulsive disorder)    OSA on CPAP 05/2012   folowed by dr Craige Cotta---- study in epic, severe OSA with AHI 118, desat nadir to 67%   (per pt mother pt uses nightly)   Pre-diabetes    Seizure disorder [facility]) 2013   neurologist--- dr [name];   (10-28-2021  per pt mother no seizure since 2013)    long hospitalization, x 2 ; "very mild"; during hospitalization "while coming off vent"   Varicose veins of both lower extremities    followed by dr j. dew ---   with venous reflux,   10-25-2021  s/p laser procedure left leg GSV and SF   Wears glasses    Past Surgical History:  Procedure Laterality Date   ACHILLES TENDON REPAIR Right 01/2017   Dr. [facility]   ACHILLES TENDON REPAIR Right 01/2017   ANKLE ARTHROSCOPY Right 11/11/2020   @[facility]  by dr Logan Bores;    debridement and stablilzation lateral collateral ligament   ENDOSCOPIC VEIN LASER TREATMENT Left 10/25/2021   by dr j. dew;   left leg GSV and SF for venous reflux   FLEXIBLE BRONCHOSCOPY  09/24/2011   Procedure: FLEXIBLE BRONCHOSCOPY;  Surgeon: Purcell Nails, MD;  Location: [facility] OR;  Service: Thoracic;  Laterality: N/A;   HYSTEROSCOPY WITH D & C N/A 09/27/2021   Procedure: DILATATION AND CURETTAGE /HYSTEROSCOPY;  Surgeon: Reva Bores, MD;  Location: [facility] OR;  Service: Gynecology;  Laterality: N/A;   INTRAUTERINE DEVICE (IUD) INSERTION N/A 11/07/2021   Procedure: INTRAUTERINE DEVICE (IUD) INSERTION;  Surgeon: Reva Bores, MD;  Location: [facility];  Service: Gynecology;  Laterality: N/A;   LUMBAR DISC SURGERY  07/18/2004   @[facility]  by dr Wynetta Emery;   L4--5 disectomy and left L3--4 nerve decompression   TRACHEOSTOMY TUBE PLACEMENT  10/11/2011   Procedure: TRACHEOSTOMY;  Surgeon: Serena Colonel, MD;  Location[facility] OR;  Service: ENT;  Laterality: N/A;   VIDEO ASSISTED THORACOSCOPY (VATS)  /THOROCOTOMY/RADIOACTIVE SEED IMPLANT Left 09/24/2011   VATS, (Dr. Barry Dienes)   FAMILY HISTORY Family History  Problem Relation Age of Onset   Arthritis Mother    Hypertension Father    Diabetes Father    Cancer Neg Hx    Coronary artery disease Neg Hx    Stroke Neg Hx    SOCIAL HISTORY Social History   Tobacco Use   Smoking status: Never   Smokeless tobacco: Never  Vaping Use   Vaping status: Never Used  Substance Use Topics   Alcohol use: Never   Drug use: Never       OPHTHALMIC EXAM:  Base Eye Exam     Visual Acuity (Snellen - Linear)  Right Left   Dist cc 20/50 -2 20/50 -1   Dist ph cc  NI    Correction: Glasses         Tonometry (Tonopen, 9:30 AM)       Right Left   Pressure 18 18         Pupils       Pupils Dark Light Shape React APD   Right PERRL 3 2 Round Brisk None   Left PERRL 3 2 Round Brisk None         Visual Fields       Left Right    Full Full         Extraocular Movement       Right Left    Full, Ortho Full, Ortho         Neuro/Psych     Oriented x3: Yes   Mood/Affect: Normal         Dilation     Left eye: 2.5% Phenylephrine @ 9:30 AM           Slit Lamp and Fundus Exam     Slit Lamp Exam       Right Left   Lids/Lashes Dermatochalasis - upper lid Dermatochalasis - upper lid   Conjunctiva/Sclera White and quiet White and quiet   Cornea trace PEE 1+ Punctate epithelial erosions   Anterior Chamber Deep and clear, narrow temporal angle, no cell or flare Deep and clear, narrow temporal angle, no cell or flare   Iris Round and dilated Round and dilated   Lens 1-2+ Cortical cataract, pigment on anterior capsule, trace Posterior subcapsular cataract 1+ Cortical cataract   Anterior Vitreous mild syneresis, no cell or pigment mild syneresis, no cell or pigment         Fundus Exam       Right Left   Disc 2+mild Pallor, Sharp rim, +PPA/PPP Pink and sharp, +PPA/PPP   C/D Ratio 0.3 0.3   Macula Flat,  Blunted foveal reflex, scattered drusen, RPE mottling and clumping Flat, Blunted foveal reflex, mild ERM, RPE mottling, no heme or edema   Vessels attenuated, Tortuous, +pigment clumping along IT arcades attenuated, scattered patches of perivascular pigment clumping   Periphery Attached, peripapillary CR atrophy with pigment clumping extending along arcades, scattered pigmented CR scarring, +drusen, reticular degeneration, No RT/RD, ?shallow schisis IT periphery Attached, peripheral bone spicules ST quad, bullous schisis along IT arcades from 0400-0530 -- posterior progression compared to prior, reticular degeneration           Refraction     Wearing Rx       Sphere Cylinder Axis   Right -1.00 +3.00 056   Left +1.25 +1.00 153            IMAGING AND PROCEDURES  Imaging and Procedures for 09/28/2022  OCT, Retina - OU - Both Eyes       Right Eye Quality was good. Central Foveal Thickness: 318. Progression has been stable. Findings include no IRF, no SRF, abnormal foveal contour, retinal drusen , epiretinal membrane, macular pucker (ERM with blunted foveal contour, mild pucker and trace cystic changes).   Left Eye Quality was good. Central Foveal Thickness: 348. Progression has been stable. Findings include no IRF, no SRF, abnormal foveal contour, myopic contour, epiretinal membrane (Mild ERM with blunted foveal contour, bullous schisis IT periphery caught on widefield -- posterior progression compared to March 2023 scans. ).   Notes *Images captured and stored on drive  Diagnosis / Impression:  OD: ERM with blunted foveal contour, mild pucker and trace cysti changes OS: Mild ERM with blunted foveal contour, bullous schisis IT periphery caught on widefield -- posterior progression compared to March 2023 scans.   Clinical management:  See below  Abbreviations: NFP - Normal foveal profile. CME - cystoid macular edema. PED - pigment epithelial detachment. IRF - intraretinal fluid.  SRF - subretinal fluid. EZ - ellipsoid zone. ERM - epiretinal membrane. ORA - outer retinal atrophy. ORT - outer retinal tubulation. SRHM - subretinal hyper-reflective material. IRHM - intraretinal hyper-reflective material     Color Fundus Photography Optos - OU - Both Eyes       Right Eye Progression has been stable. Disc findings include pallor (peripapillary atrophy w/ bone spicules). Macula : normal observations. Vessels : attenuated. Periphery : degeneration, RPE abnormality (scattered patches of peripheral pigmented CR scarring and atrophy).   Left Eye Progression has been stable. Disc findings include pallor (Mild PPA). Macula : normal observations. Vessels : attenuated. Periphery : degeneration, RPE abnormality (bullous retinoschisis IT periphery -- posterior progression compared to 2023 images, focal patch of pigmented CR scarring and atrophy ST periphery ).   Notes **Images stored on drive**  Impression: Chorioretinis OU -- scattered patches of pigmented CR scarring and atrophy OU OD: peripapillary atrophy w/ bone spicules, scattered patches of peripheral pigmented CR scarring and atrophy OS: bullous retinoschisis IT periphery -- posterior progression compared to 2023 images; mild PPA, focal patch of pigmented CR scarring and atrophy ST periphery      Repair Retinal Breaks, Laser - OS - Left Eye       LASER PROCEDURE NOTE  Procedure:  Barrier laser retinopexy using slit lamp laser, LEFT eye   Diagnosis:   Peripheral retinoschisis, LEFT eye                     Progressive bullous retinoschisis from [id number]  Surgeon: Rennis [name], MD, PhD  Anesthesia: Topical  Informed consent obtained, operative eye marked, and time out performed prior to initiation of laser.   Laser settings:  Lumenis Smart532 laser, slit lamp Lens: Mainster PRP 165 Power: 270 mW Spot size: 200 microns Duration: 30 msec  # spots: 535  Placement of laser: Using a Mainster PRP 165 contact  lens at the slit lamp, laser was placed in three confluent rows just posterior to bullous retinoschisis cavity spanning 0330-0530.  Complications: None.  Patient tolerated the procedure well and received written and verbal post-procedure care information/education.           ASSESSMENT/PLAN:    ICD-10-CM   1. Granulomatous chorioretinitis of both eyes  H30.893     2. Left retinoschisis  H33.102 Color Fundus Photography Optos - OU - Both Eyes    Repair Retinal Breaks, Laser - OS - Left Eye    CANCELED: Repair Retinal Breaks, Laser - OS - Left Eye    3. Epiretinal membrane (ERM) of both eyes  H35.373 OCT, Retina - OU - Both Eyes    4. Cortical cataract of both eyes  H26.9     5. Left retinal defect  H33.302 Repair Retinal Breaks, Laser - OS - Left Eye    CANCELED: Repair Retinal Breaks, Laser - OS - Left Eye     1. Chorioretinitis OU - pt of Dr. Sharee Pimple at [facility], originally referred by [facility] in 2015 - pt has been stable for last several years without active inflammation on no medicationsand has been monitored annually - due  to recent stability, pt and family requested retina follow up closer to home - BCVA 20/50 OD, 20/50 OS -- subjectively and objectively stable (BCVA at [facility] 03/2020 was 20/64 OU) - exam shows pigmented CR scarring OU -- no signs of active inflammation - unable to check FA -- no vein access - no retinal or ophthalmic interventions indicated or recommended   2. Retinoschisis OS  - bullous retinoschisis IT periphery OU w/ posterior extension to IT arcades -- posterior progression since initial visit in March 2023  - no associated RT/RD OS  - ?shallow schisis IT periphery OD  - discussed findings and progression  - recommend laser retinopexy OS today, 08.01.24  - pt wishes to proceed with laser  - RBA of procedure discussed, questions answered - informed consent obtained and signed - see procedure note  - start PF QID OS x7 days  - f/u in 3 wks  3.  Epiretinal membrane, both eyes -- stable - BCVA 20/50 OD, 20/50 OS - asymptomatic, no metamorphopsia - no indication for surgery at this time - monitor for now - f/u 3 mos -- DFE/OCT  4. Cortical Cataract OU - The symptoms of cataract, surgical options, and treatments and risks were discussed with patient. - discussed diagnosis and progression - not yet visually significant - monitor for now   Ophthalmic Meds Ordered this visit:  Meds ordered this encounter  Medications   prednisoLONE acetate (PRED FORTE) 1 % ophthalmic suspension    Sig: Place 1 drop into the left eye 4 (four) times daily for 7 days.    Dispense:  1.4 mL    Refill:  0     Return in 3 weeks (on 10/19/2022) for POV s/p laser pexy OS for schisis -- DFE, OCT, scan through schisis .  There are no Patient Instructions on file for this visit.   Explained the diagnoses, plan, and follow up with the patient and they expressed understanding.  Patient expressed understanding of the importance of proper follow up care.   This document serves as a record of services personally performed by Karie Chimera, MD, PhD. It was created on their behalf by Glee Arvin. Manson Passey, OA an ophthalmic technician. The creation of this record is the provider's dictation and/or activities during the visit.    Electronically signed by: Glee Arvin. Manson Passey, OA 09/28/22 3:02 PM  Karie Chimera, M.D., Ph.D. Diseases & Surgery of the Retina and Vitreous [facility]  I have reviewed the above documentation for accuracy and completeness, and I agree with the above. Karie Chimera, M.D., Ph.D. 09/28/22 3:05 PM  Abbreviations: M myopia (nearsighted); A astigmatism; H hyperopia (farsighted); P presbyopia; Mrx spectacle prescription;  CTL contact lenses; OD right eye; OS left eye; OU both eyes  XT exotropia; ET esotropia; PEK punctate epithelial keratitis; PEE punctate epithelial erosions; DES dry eye syndrome; MGD meibomian gland  dysfunction; ATs artificial tears; PFAT's preservative free artificial tears; NSC nuclear sclerotic cataract; PSC posterior subcapsular cataract; ERM epi-retinal membrane; PVD posterior vitreous detachment; RD retinal detachment; DM diabetes mellitus; DR diabetic retinopathy; NPDR non-proliferative diabetic retinopathy; PDR proliferative diabetic retinopathy; CSME clinically significant macular edema; DME diabetic macular edema; dbh dot blot hemorrhages; CWS cotton wool spot; POAG primary open angle glaucoma; C/D cup-to-disc ratio; HVF humphrey visual field; GVF goldmann visual field; OCT optical coherence tomography; IOP intraocular pressure; BRVO Branch retinal vein occlusion; CRVO central retinal vein occlusion; CRAO central retinal artery occlusion; BRAO branch retinal artery occlusion;  RT retinal tear; SB scleral buckle; PPV pars plana vitrectomy; VH Vitreous hemorrhage; PRP panretinal laser photocoagulation; IVK intravitreal kenalog; VMT vitreomacular traction; MH Macular hole;  NVD neovascularization of the disc; NVE neovascularization elsewhere; AREDS age related eye disease study; ARMD age related macular degeneration; POAG primary open angle glaucoma; EBMD epithelial/anterior basement membrane dystrophy; ACIOL anterior chamber intraocular lens; IOL intraocular lens; PCIOL posterior chamber intraocular lens; Phaco/IOL phacoemulsification with intraocular lens placement; PRK photorefractive keratectomy; LASIK laser assisted in situ keratomileusis; HTN hypertension; DM diabetes mellitus; COPD chronic obstructive pulmonary disease

## 2022-09-21 NOTE — Telephone Encounter (Signed)
Clobetasol crm Last filled:  11/04/21, #45 g Last OV:  01/17/22, CPE Next OV:  none

## 2022-09-22 ENCOUNTER — Other Ambulatory Visit: Payer: Self-pay | Admitting: Family Medicine

## 2022-09-22 DIAGNOSIS — E039 Hypothyroidism, unspecified: Secondary | ICD-10-CM

## 2022-09-26 ENCOUNTER — Ambulatory Visit (INDEPENDENT_AMBULATORY_CARE_PROVIDER_SITE_OTHER): Payer: Medicare PPO | Admitting: Nurse Practitioner

## 2022-09-26 ENCOUNTER — Encounter (INDEPENDENT_AMBULATORY_CARE_PROVIDER_SITE_OTHER): Payer: Self-pay | Admitting: Nurse Practitioner

## 2022-09-26 VITALS — BP 116/61 | HR 74 | Resp 18 | Ht <= 58 in | Wt 246.6 lb

## 2022-09-26 DIAGNOSIS — M79604 Pain in right leg: Secondary | ICD-10-CM

## 2022-09-26 DIAGNOSIS — I890 Lymphedema, not elsewhere classified: Secondary | ICD-10-CM

## 2022-09-26 DIAGNOSIS — M79605 Pain in left leg: Secondary | ICD-10-CM | POA: Diagnosis not present

## 2022-09-27 ENCOUNTER — Encounter (INDEPENDENT_AMBULATORY_CARE_PROVIDER_SITE_OTHER): Payer: Self-pay

## 2022-09-27 NOTE — Progress Notes (Signed)
Subjective:    Patient ID: [name], female    DOB: 11-18-1970, 52 y.o.   MRN: [id number] Chief Complaint  Patient presents with   Follow-up    3-4 weeks no studies.    The patient returns today for follow-up evaluation of her lower extremity edema.  The patient has Down syndrome and is a somewhat poor historian but her mother does assist with her history.  They note that she has been wearing her compression socks at home and elevating her legs.  Most importantly she has been utilizing her lymphedema pump consistently and the swelling has improved and the pain and tenderness associated with that has also improved.  However of greater concern for the patient is the fact that her right leg hurts her nearly every evening.  She notes that the pain occurs during the night and wakes her up.  From her description sounds somewhat like a throbbing aching sensation.  Her mother notes that this is probably been going on since the beginning of the summer.  Is also noted that the patient has a somewhat abnormal gait.  The patient's mother notes that walking seems somewhat painful to the patient at times.    Review of Systems  Cardiovascular:  Positive for leg swelling.  Musculoskeletal:  Positive for back pain and gait problem.  All other systems reviewed and are negative.      Objective:   Physical Exam Vitals reviewed.  HENT:     Head: Normocephalic.  Cardiovascular:     Rate and Rhythm: Normal rate.     Pulses:          Dorsalis pedis pulses are 1+ on the right side and 1+ on the left side.  Pulmonary:     Effort: Pulmonary effort is normal.  Musculoskeletal:     Right lower leg: 1+ Edema present.     Left lower leg: 1+ Edema present.  Skin:    General: Skin is warm and dry.  Neurological:     Mental Status: She is alert and oriented to person, place, and time.  Psychiatric:        Mood and Affect: Mood normal.        Behavior: Behavior normal.        Thought Content: Thought  content normal.        Judgment: Judgment normal.     BP 116/61 (BP Location: Left Wrist)   Pulse 74   Resp 18   Ht 4\' 8"  (1.422 m)   Wt 246 lb 9.6 oz (111.9 kg)   LMP  (LMP Unknown)   BMI 55.29 kg/m   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 [facility])    neurologist--- dr [name];   secondary to down syndrome (Trisomy 21-related),  nl CT head 2010, did not tolerate aricept   Down syndrome    Eczema    Edema of both lower extremities    Endometrial hyperplasia    H/O pleural empyema 09/24/2011   acute  in setting CAP /   s/p left  VATS with drainage empyema,  post op acute respiratory failure --- resolved   History of DVT of lower extremity  05/11/2021   dx left lower extremity popliteal vein,  completed 3 months eliquis then changed to ASA 81mg    History of recurrent pneumonia    multiple pneumonias   Hypothyroidism    MDD (major depressive disorder)    OA (osteoarthritis)    bilat knee arthritis, had 3/3 Supartz injections [name])   OCD (obsessive compulsive disorder)    OSA on CPAP 05/2012   folowed by dr Craige Cotta---- study in epic, severe OSA with AHI 118, desat nadir to 67%   (per pt mother pt uses nightly)   Pre-diabetes    Seizure disorder [facility]) 2013   neurologist--- dr [name];   (10-28-2021  per pt mother no seizure since 2013)    long hospitalization, x 2 ; "very mild"; during hospitalization "while coming off vent"   Varicose veins of both lower extremities    followed by dr j. dew ---   with venous reflux,   10-25-2021  s/p laser procedure left leg GSV and SF   Wears glasses     Social History   Socioeconomic History   Marital status: Single    Spouse name: Not on file   Number of children: 0    Years of education: Not on file   Highest education level: Not on file  Occupational History   Not on file  Tobacco Use   Smoking status: Never   Smokeless tobacco: Never  Vaping Use   Vaping status: Never Used  Substance and Sexual Activity   Alcohol use: Never   Drug use: Never   Sexual activity: Never    Birth control/protection: None  Other Topics Concern   Not on file  Social History Narrative   Lives with mom and dad, dogs   Stays at daycare during the day   Activity: walking some   Diet: good water, fruits/vegetables daily   Social Determinants of Health   Financial Resource Strain: Low Risk  (10/20/2021)   Overall Financial Resource Strain (CARDIA)    Difficulty of Paying Living Expenses: Not hard at all  Food Insecurity: No Food Insecurity (10/20/2021)   Hunger Vital Sign    Worried About Running Out of Food in the Last Year: Never true    Ran Out of Food in the Last Year: Never true  Transportation Needs: No Transportation Needs (10/20/2021)   PRAPARE - Administrator, Civil Service (Medical): No    Lack of Transportation (Non-Medical): No  Physical Activity: Inactive (10/20/2021)   Exercise Vital Sign    Days of Exercise per Week: 0 days    Minutes of Exercise per Session: 0 min  Stress: No Stress Concern Present (10/20/2021)   [employer] of Occupational Health - Occupational Stress Questionnaire    Feeling of Stress : Only a little  Social Connections: Socially Isolated (10/20/2021)   Social Connection and Isolation Panel [NHANES]    Frequency of Communication with Friends and Family: Twice a week    Frequency of Social Gatherings with Friends and Family: Three times a week    Attends Religious Services: Never    Active Member of Clubs or Organizations: No    Attends Banker Meetings: Never    Marital Status: Never married  Intimate Partner Violence: Not At Risk (10/20/2021)   Humiliation, Afraid, Rape, and Kick questionnaire     Fear of Current or Ex-Partner: No    Emotionally Abused: No    Physically Abused: No    Sexually Abused: No  Past Surgical History:  Procedure Laterality Date   ACHILLES TENDON REPAIR Right 01/2017   Dr[facility]   ACHILLES TENDON REPAIR Right 01/2017   ANKLE ARTHROSCOPY Right 11/11/2020   @[facility]  by dr Logan Bores;    debridement and stablilzation lateral collateral ligament   ENDOSCOPIC VEIN LASER TREATMENT Left 10/25/2021   by dr j. dew;   left leg GSV and SF for venous reflux   FLEXIBLE BRONCHOSCOPY  09/24/2011   Procedure: FLEXIBLE BRONCHOSCOPY;  Surgeon: Purcell Nails, MD;  Location: [facility] OR;  Service: Thoracic;  Laterality: N/A;   HYSTEROSCOPY WITH D & C N/A 09/27/2021   Procedure: DILATATION AND CURETTAGE /HYSTEROSCOPY;  Surgeon: Reva Bores, MD;  Location: [facility] OR;  Service: Gynecology;  Laterality: N/A;   INTRAUTERINE DEVICE (IUD) INSERTION N/A 11/07/2021   Procedure: INTRAUTERINE DEVICE (IUD) INSERTION;  Surgeon: Reva Bores, MD;  Location: [facility];  Service: Gynecology;  Laterality: N/A;   LUMBAR DISC SURGERY  07/18/2004   @[facility]  by dr Wynetta Emery;   L4--5 disectomy and left L3--4 nerve decompression   TRACHEOSTOMY TUBE PLACEMENT  10/11/2011   Procedure: TRACHEOSTOMY;  Surgeon: Serena Colonel, MD;  Location: [facility] OR;  Service: ENT;  Laterality: N/A;   VIDEO ASSISTED THORACOSCOPY (VATS) /THOROCOTOMY/RADIOACTIVE SEED IMPLANT Left 09/24/2011   VATS, (Dr. Barry Dienes)    Family History  Problem Relation Age of Onset   Arthritis Mother    Hypertension Father    Diabetes Father    Cancer Neg Hx    Coronary artery disease Neg Hx    Stroke Neg Hx     No Known Allergies     Latest Ref Rng & Units 01/10/2022    7:59 AM 11/07/2021    8:00 AM 09/27/2021    1:06 PM  CBC  WBC 4.0 - 10.5 K/uL 4.3  4.8  4.9   Hemoglobin 12.0 - 15.0 g/dL 13.0  86.5  78.4   Hematocrit 36.0 - 46.0 % 41.5  44.0  40.6   Platelets 150.0 - 400.0 K/uL 200.0  169  220       CMP      Component Value Date/Time   NA 138 01/10/2022 0759   K 4.4 01/10/2022 0759   CL 102 01/10/2022 0759   CO2 31 01/10/2022 0759   GLUCOSE 98 01/10/2022 0759   BUN 21 01/10/2022 0759   CREATININE 0.97 01/10/2022 0759   CALCIUM 8.9 01/10/2022 0759   PROT 7.0 01/10/2022 0759   ALBUMIN 4.1 01/10/2022 0759   AST 18 01/10/2022 0759   ALT 17 01/10/2022 0759   ALKPHOS 50 01/10/2022 0759   BILITOT 0.2 01/10/2022 0759   GFR 67.74 01/10/2022 0759   GFRNONAA >60 10/06/2019 1655     No results found.     Assessment & Plan:   1. Pain in both lower extremities The patient has worsening pain that is waking her up at night.  Certainly this is suspicious for rest pain.  However it is also concerning that she may have some lower back issues contributing to this pain or even possible restless leg syndrome, which her mother has.  Will plan to have the patient return for ABIs.  She also has an upcoming evaluation with her PCP and they will be able to evaluate for other possible causes of this ongoing pain.  2. Lymphedema The patient has become more diligent with conservative therapies and so swelling is much improved and so is the discomfort associated with her swelling.  She is encouraged to continue with medical grade compression, elevation and activity in addition to consistent use for lymphedema pump.   Current Outpatient Medications on File Prior to Visit  Medication Sig Dispense Refill   acetaminophen (TYLENOL) 650 MG CR tablet Take 1,300 mg by mouth every 8 (eight) hours as needed for pain.     allopurinol (ZYLOPRIM) 100 MG tablet Take 1 tablet (100 mg total) by mouth at bedtime. 90 tablet 3   aspirin EC 81 MG tablet Take 1 tablet (81 mg total) by mouth daily. Swallow whole.     clobetasol cream (TEMOVATE) 0.05 % APPLY TO SKIN RASH 2 TIMES DAILY AS NEEDED. MAX 2 WEEKS AT A TIME, AVOID FACE, UNDERARMS, GROIN. 45 g 0   Ibuprofen 200 MG CAPS Take 3 capsules (600 mg total) by mouth 2 (two) times  daily as needed.     levETIRAcetam (KEPPRA) 500 MG tablet TAKE 1 TABLET BY MOUTH TWICE A DAY 180 tablet 3   levothyroxine (SYNTHROID) 125 MCG tablet TAKE 1 TABLET BY MOUTH EVERY DAY BEFORE BREAKFAST 90 tablet 1   memantine (NAMENDA) 10 MG tablet TAKE 1 TABLET BY MOUTH TWICE A DAY 180 tablet 3   Multiple Vitamins-Minerals (WOMENS MULTI PO) Take 1 tablet by mouth at bedtime.     silver sulfADIAZINE (SILVADENE) 1 % cream Apply 1 application. topically daily. 50 g 0   traMADol (ULTRAM) 50 MG tablet TAKE 1 TABLET (50 MG TOTAL) BY MOUTH TWICE A DAY AS NEEDED FOR MODERATE PAIN 30 tablet 0   No current facility-administered medications on file prior to visit.    There are no Patient Instructions on file for this visit. No follow-ups on file.   Georgiana Spinner, NP

## 2022-09-28 ENCOUNTER — Encounter (INDEPENDENT_AMBULATORY_CARE_PROVIDER_SITE_OTHER): Payer: Self-pay | Admitting: Ophthalmology

## 2022-09-28 ENCOUNTER — Ambulatory Visit (INDEPENDENT_AMBULATORY_CARE_PROVIDER_SITE_OTHER): Payer: Medicare PPO | Admitting: Ophthalmology

## 2022-09-28 DIAGNOSIS — H33302 Unspecified retinal break, left eye: Secondary | ICD-10-CM | POA: Diagnosis not present

## 2022-09-28 DIAGNOSIS — H269 Unspecified cataract: Secondary | ICD-10-CM

## 2022-09-28 DIAGNOSIS — H33102 Unspecified retinoschisis, left eye: Secondary | ICD-10-CM | POA: Diagnosis not present

## 2022-09-28 DIAGNOSIS — H35373 Puckering of macula, bilateral: Secondary | ICD-10-CM

## 2022-09-28 DIAGNOSIS — H30893 Other chorioretinal inflammations, bilateral: Secondary | ICD-10-CM | POA: Diagnosis not present

## 2022-09-28 HISTORY — PX: RETINAL LASER PROCEDURE: SHX2339

## 2022-09-28 MED ORDER — PREDNISOLONE ACETATE 1 % OP SUSP: 1.0000 [drp] | Freq: Four times a day (QID) | OPHTHALMIC | 0 refills | Status: AC

## 2022-10-05 ENCOUNTER — Other Ambulatory Visit (INDEPENDENT_AMBULATORY_CARE_PROVIDER_SITE_OTHER): Payer: Self-pay | Admitting: Ophthalmology

## 2022-10-09 NOTE — Progress Notes (Signed)
[facility] - Clinic Note  10/19/2022     CHIEF COMPLAINT Patient presents for Retina Follow Up   HISTORY OF PRESENT ILLNESS: [name] is a 52 y.o. female who presents to the clinic today for:   HPI     Retina Follow Up   In left eye.  This started 3 weeks ago.  Duration of 3 weeks.  Since onset it is stable.  I, the attending physician,  performed the HPI with the patient and updated documentation appropriately.        Comments   3 week retina follow up retinoschisis OS pt is reporting no vision changes noticed she denies any flashes or floaters according to her mom       Last edited by Rennis [name], MD on 10/19/2022 10:59 AM.    Pt states she did not have any problems after the laser procedure at last visit   Referring physician: Sallye Lat, MD [address] [place],  [place] [address]   HISTORICAL INFORMATION:   Selected notes from the MEDICAL RECORD NUMBER Referred by Dr. Dione Booze LEE:  Ocular Hx- granulomatous chorioretinitis OU -- formerly followed by Dr. Everlena Cooper annually -- pt stable and wanted to transfer ophthalmologic care closer to home PMH-previously followed by Dr. Ashley Royalty (05.15.12) and Dr. Everlena Cooper at [facility] (02.11.22)    CURRENT MEDICATIONS: No current outpatient medications on file. (Ophthalmic Drugs)   No current facility-administered medications for this visit. (Ophthalmic Drugs)   Current Outpatient Medications (Other)  Medication Sig   acetaminophen (TYLENOL) 650 MG CR tablet Take 1,300 mg by mouth every 8 (eight) hours as needed for pain.   allopurinol (ZYLOPRIM) 100 MG tablet Take 1 tablet (100 mg total) by mouth at bedtime.   aspirin EC 81 MG tablet Take 1 tablet (81 mg total) by mouth daily. Swallow whole.   clobetasol cream (TEMOVATE) 0.05 % APPLY TO SKIN RASH 2 TIMES DAILY AS NEEDED. MAX 2 WEEKS AT A TIME, AVOID FACE, UNDERARMS, GROIN.   Ibuprofen 200 MG CAPS Take 3 capsules (600 mg total) by mouth 2 (two)  times daily as needed.   levETIRAcetam (KEPPRA) 500 MG tablet TAKE 1 TABLET BY MOUTH TWICE A DAY   levothyroxine (SYNTHROID) 125 MCG tablet TAKE 1 TABLET BY MOUTH EVERY DAY BEFORE BREAKFAST   memantine (NAMENDA) 10 MG tablet TAKE 1 TABLET BY MOUTH TWICE A DAY   Multiple Vitamins-Minerals (WOMENS MULTI PO) Take 1 tablet by mouth at bedtime.   silver sulfADIAZINE (SILVADENE) 1 % cream Apply 1 application. topically daily.   traMADol (ULTRAM) 50 MG tablet TAKE 1 TABLET (50 MG TOTAL) BY MOUTH TWICE A DAY AS NEEDED FOR MODERATE PAIN   No current facility-administered medications for this visit. (Other)   REVIEW OF SYSTEMS: ROS   Positive for: Neurological, Eyes Negative for: Constitutional, Gastrointestinal, Skin, Genitourinary, Musculoskeletal, HENT, Cardiovascular, Respiratory, Allergic/Imm, Heme/Lymph Last edited by [name], COT on 10/19/2022  9:16 AM.      ALLERGIES No Known Allergies  PAST MEDICAL HISTORY 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 [facility])    neurologist--- dr [name];   secondary to down syndrome (Trisomy 21-related),  nl CT head 2010, did not tolerate aricept   Down syndrome    Eczema    Edema of both lower extremities    Endometrial hyperplasia    H/O pleural empyema 09/24/2011   acute  in setting CAP /   s/p left  VATS with drainage empyema,  post op acute respiratory failure --- resolved   History of DVT of lower extremity 05/11/2021   dx left lower extremity popliteal vein,  completed 3 months eliquis then changed to ASA 81mg    History of recurrent pneumonia    multiple pneumonias   Hypothyroidism    MDD (major depressive disorder)    OA (osteoarthritis)    bilat knee  arthritis, had 3/3 Supartz injections [name])   OCD (obsessive compulsive disorder)    OSA on CPAP 05/2012   folowed by dr Craige Cotta---- study in epic, severe OSA with AHI 118, desat nadir to 67%   (per pt mother pt uses nightly)   Pre-diabetes    Seizure disorder [facility]) 2013   neurologist--- dr [name];   (10-28-2021  per pt mother no seizure since 2013)    long hospitalization, x 2 ; "very mild"; during hospitalization "while coming off vent"   Varicose veins of both lower extremities    followed by dr j. dew ---   with venous reflux,   10-25-2021  s/p laser procedure left leg GSV and SF   Wears glasses    Past Surgical History:  Procedure Laterality Date   ACHILLES TENDON REPAIR Right 01/2017   Dr. [facility]   ACHILLES TENDON REPAIR Right 01/2017   ANKLE ARTHROSCOPY Right 11/11/2020   @[facility]  by dr Logan Bores;    debridement and stablilzation lateral collateral ligament   ENDOSCOPIC VEIN LASER TREATMENT Left 10/25/2021   by dr j. dew;   left leg GSV and SF for venous reflux   FLEXIBLE BRONCHOSCOPY  09/24/2011   Procedure: FLEXIBLE BRONCHOSCOPY;  Surgeon: Purcell Nails, MD;  [facility];  Service: Thoracic;  Laterality: N/A;   HYSTEROSCOPY WITH D & C N/A 09/27/2021   Procedure: DILATATION AND CURETTAGE /HYSTEROSCOPY;  Surgeon: Reva Bores, MD;  Location: [facility] OR;  Service: Gynecology;  Laterality: N/A;   INTRAUTERINE DEVICE (IUD) INSERTION N/A 11/07/2021   Procedure: INTRAUTERINE DEVICE (IUD) INSERTION;  Surgeon: Reva Bores, MD;  Location: [facility];  Service: Gynecology;  Laterality: N/A;   LUMBAR DISC SURGERY  07/18/2004   @[facility]  by dr Wynetta Emery;   L4--5 disectomy and left L3--4 nerve decompression   TRACHEOSTOMY TUBE PLACEMENT  10/11/2011   Procedure: TRACHEOSTOMY;  Surgeon: Serena Colonel, MD;  Location: [facility] OR;  Service: ENT;  Laterality: N/A;   VIDEO ASSISTED THORACOSCOPY (VATS) /THOROCOTOMY/RADIOACTIVE SEED IMPLANT Left 09/24/2011   VATS, (Dr. Barry Dienes)   FAMILY  HISTORY Family History  Problem Relation Age of Onset   Arthritis Mother    Hypertension Father    Diabetes Father    Cancer Neg Hx    Coronary artery disease Neg Hx    Stroke Neg Hx    SOCIAL HISTORY Social History   Tobacco Use   Smoking status: Never   Smokeless tobacco: Never  Vaping Use   Vaping status: Never Used  Substance Use Topics   Alcohol use: Never   Drug use: Never       OPHTHALMIC EXAM:  Base Eye Exam     Visual Acuity (Snellen - Linear)       Right Left   Dist cc  20/50 -1 20/50   Dist ph cc NI NI    Correction: Glasses         Tonometry (Tonopen, 9:20 AM)       Right Left   Pressure 16 20  Squeezing         Pupils       Pupils Dark Light Shape React APD   Right PERRL 3 2 Round Brisk None   Left PERRL 3 2 Round Brisk None         Visual Fields       Left Right    Full Full         Extraocular Movement       Right Left    Full, Ortho Full, Ortho         Neuro/Psych     Oriented x3: Yes   Mood/Affect: Normal         Dilation     Left eye: 2.5% Phenylephrine @ 9:23 AM           Slit Lamp and Fundus Exam     Slit Lamp Exam       Right Left   Lids/Lashes Dermatochalasis - upper lid Dermatochalasis - upper lid   Conjunctiva/Sclera White and quiet White and quiet   Cornea trace PEE 1+ Punctate epithelial erosions   Anterior Chamber Deep and clear, narrow temporal angle, no cell or flare Deep and clear, narrow temporal angle, no cell or flare   Iris Round and dilated Round and dilated   Lens 1-2+ Cortical cataract, pigment on anterior capsule, trace Posterior subcapsular cataract 1+ Cortical cataract   Anterior Vitreous mild syneresis, no cell or pigment mild syneresis, no cell or pigment         Fundus Exam       Right Left   Disc 2+mild Pallor, Sharp rim, +PPA/PPP Pink and sharp, +PPA/PPP   C/D Ratio 0.3 0.3   Macula Flat, Blunted foveal reflex, scattered drusen, RPE mottling and clumping Flat,  Blunted foveal reflex, mild ERM, RPE mottling, no heme or edema   Vessels attenuated, Tortuous, +pigment clumping along IT arcades attenuated, scattered patches of perivascular pigment clumping   Periphery Attached, peripapillary CR atrophy with pigment clumping extending along arcades, scattered pigmented CR scarring, +drusen, reticular degeneration, No RT/RD, ?shallow schisis IT periphery Attached, peripheral bone spicules ST quad, bullous schisis along IT arcades from 0400-0530 -- good laser surrounding, reticular degeneration           Refraction     Wearing Rx       Sphere Cylinder Axis   Right -1.00 +3.00 056   Left +1.25 +1.00 153            IMAGING AND PROCEDURES  Imaging and Procedures for 10/19/2022  OCT, Retina - OU - Both Eyes       Right Eye Quality was good. Central Foveal Thickness: 325. Progression has been stable. Findings include no IRF, no SRF, abnormal foveal contour, retinal drusen , epiretinal membrane, macular pucker (ERM with blunted foveal contour, mild pucker and trace cystic changes).   Left Eye Quality was good. Central Foveal Thickness: 344. Progression has been stable. Findings include no IRF, no SRF, abnormal foveal contour, myopic contour, epiretinal membrane (Mild ERM with blunted foveal contour, bullous schisis IT periphery caught on widefield ).   Notes *Images captured and stored on drive  Diagnosis / Impression:  OD: ERM with blunted foveal contour, mild pucker and trace cysti changes OS: Mild  ERM with blunted foveal contour, bullous schisis IT periphery caught on widefield   Clinical management:  See below  Abbreviations: NFP - Normal foveal profile. CME - cystoid macular edema. PED - pigment epithelial detachment. IRF - intraretinal fluid. SRF - subretinal fluid. EZ - ellipsoid zone. ERM - epiretinal membrane. ORA - outer retinal atrophy. ORT - outer retinal tubulation. SRHM - subretinal hyper-reflective material. IRHM - intraretinal  hyper-reflective material           ASSESSMENT/PLAN:    ICD-10-CM   1. Granulomatous chorioretinitis of both eyes  H30.893 OCT, Retina - OU - Both Eyes    2. Left retinoschisis  H33.102 OCT, Retina - OU - Both Eyes    3. Epiretinal membrane (ERM) of both eyes  H35.373     4. Cortical cataract of both eyes  H26.9     5. Left retinal defect  H33.302      1. Chorioretinitis OU - pt of Dr. Sharee Pimple at [facility], originally referred by [facility] in 2015 - pt has been stable for last several years without active inflammation on no medications and has been monitored annually - due to recent stability, pt and family requested retina follow up closer to home - BCVA 20/50 OD, 20/50 OS -- subjectively and objectively stable (BC[facility] at [facility] 03/2020 was 20/64 OU) - exam shows pigmented CR scarring OU -- no signs of active inflammation - unable to check FA -- no vein access - no retinal or ophthalmic interventions indicated or recommended  - f/u 4-6 months, DFE, OCT  2. Retinoschisis OS  - bullous retinoschisis IT periphery OU w/ posterior extension to IT arcades -- posterior progression since initial visit in March 2023  - no associated RT/RD OS  - ?shallow schisis IT periphery OD  - s/p laser retinopexy OS (08.01.24) -- good laser surrounding  - completed PF QID OS x7 days  - monitor  3. Epiretinal membrane, both eyes -- stable - BCVA 20/50 OD, 20/50 OS - asymptomatic, no metamorphopsia - no indication for surgery at this time - monitor for now - f/u 4-6 mos -- DFE/OCT  4. Cortical Cataract OU - The symptoms of cataract, surgical options, and treatments and risks were discussed with patient. - discussed diagnosis and progression - not yet visually significant - monitor for now   Ophthalmic Meds Ordered this visit:  No orders of the defined types were placed in this encounter.    Return for f/u 4-6 months chorioretinitis OU, DFE, OCT.  There are no Patient Instructions on file for  this visit.   Explained the diagnoses, plan, and follow up with the patient and they expressed understanding.  Patient expressed understanding of the importance of proper follow up care.   This document serves as a record of services personally performed by Karie Chimera, MD, PhD. It was created on their behalf by Glee Arvin. Manson Passey, OA an ophthalmic technician. The creation of this record is the provider's dictation and/or activities during the visit.    Electronically signed by: Glee Arvin. Manson Passey, OA 10/19/22 11:00 AM  Karie Chimera, M.D., Ph.D. Diseases & Surgery of the Retina and Vitreous [facility]  I have reviewed the above documentation for accuracy and completeness, and I agree with the above. Karie Chimera, M.D., Ph.D. 10/19/22 11:01 AM   Abbreviations: M myopia (nearsighted); A astigmatism; H hyperopia (farsighted); P presbyopia; Mrx spectacle prescription;  CTL contact lenses; OD right eye; OS left eye; OU  both eyes  XT exotropia; ET esotropia; PEK punctate epithelial keratitis; PEE punctate epithelial erosions; DES dry eye syndrome; MGD meibomian gland dysfunction; ATs artificial tears; PFAT's preservative free artificial tears; NSC nuclear sclerotic cataract; PSC posterior subcapsular cataract; ERM epi-retinal membrane; PVD posterior vitreous detachment; RD retinal detachment; DM diabetes mellitus; DR diabetic retinopathy; NPDR non-proliferative diabetic retinopathy; PDR proliferative diabetic retinopathy; CSME clinically significant macular edema; DME diabetic macular edema; dbh dot blot hemorrhages; CWS cotton wool spot; POAG primary open angle glaucoma; C/D cup-to-disc ratio; HVF humphrey visual field; GVF goldmann visual field; OCT optical coherence tomography; IOP intraocular pressure; BRVO Branch retinal vein occlusion; CRVO central retinal vein occlusion; CRAO central retinal artery occlusion; BRAO branch retinal artery occlusion; RT retinal tear; SB  scleral buckle; PPV pars plana vitrectomy; VH Vitreous hemorrhage; PRP panretinal laser photocoagulation; IVK intravitreal kenalog; VMT vitreomacular traction; MH Macular hole;  NVD neovascularization of the disc; NVE neovascularization elsewhere; AREDS age related eye disease study; ARMD age related macular degeneration; POAG primary open angle glaucoma; EBMD epithelial/anterior basement membrane dystrophy; ACIOL anterior chamber intraocular lens; IOL intraocular lens; PCIOL posterior chamber intraocular lens; Phaco/IOL phacoemulsification with intraocular lens placement; PRK photorefractive keratectomy; LASIK laser assisted in situ keratomileusis; HTN hypertension; DM diabetes mellitus; COPD chronic obstructive pulmonary disease

## 2022-10-19 ENCOUNTER — Encounter (INDEPENDENT_AMBULATORY_CARE_PROVIDER_SITE_OTHER): Payer: Self-pay | Admitting: Ophthalmology

## 2022-10-19 ENCOUNTER — Ambulatory Visit (INDEPENDENT_AMBULATORY_CARE_PROVIDER_SITE_OTHER): Payer: Medicare PPO | Admitting: Ophthalmology

## 2022-10-19 DIAGNOSIS — H33102 Unspecified retinoschisis, left eye: Secondary | ICD-10-CM | POA: Diagnosis not present

## 2022-10-19 DIAGNOSIS — H35373 Puckering of macula, bilateral: Secondary | ICD-10-CM | POA: Diagnosis not present

## 2022-10-19 DIAGNOSIS — H30893 Other chorioretinal inflammations, bilateral: Secondary | ICD-10-CM | POA: Diagnosis not present

## 2022-10-19 DIAGNOSIS — H33302 Unspecified retinal break, left eye: Secondary | ICD-10-CM

## 2022-10-19 DIAGNOSIS — H269 Unspecified cataract: Secondary | ICD-10-CM | POA: Diagnosis not present

## 2022-10-23 ENCOUNTER — Ambulatory Visit (INDEPENDENT_AMBULATORY_CARE_PROVIDER_SITE_OTHER): Payer: Medicare PPO

## 2022-10-23 VITALS — Ht <= 58 in | Wt 229.0 lb

## 2022-10-23 DIAGNOSIS — Z0000 Encounter for general adult medical examination without abnormal findings: Secondary | ICD-10-CM

## 2022-10-23 NOTE — Progress Notes (Signed)
Subjective:   [name] is a 52 y.o. female who presents for Medicare Annual (Subsequent) preventive examination.  Visit Complete: Virtual  I connected with  Breiona M Raffel's mother and caregiver Verba Degen on 10/23/22 by a audio enabled telemedicine application and verified that I am speaking with the correct person using two identifiers.  Patient Location: Home  Provider Location: Home Office  I discussed the limitations of evaluation and management by telemedicine. The patient expressed understanding and agreed to proceed.  Vital Signs: Because this visit was a virtual/telehealth visit, some criteria may be missing or patient reported. Any vitals not documented were not able to be obtained and vitals that have been documented are patient reported.    Review of Systems      Cardiac Risk Factors include: advanced age (>67men, >45 women);obesity (BMI >30kg/m2);sedentary lifestyle     Objective:    Today's Vitals   10/23/22 1119  Weight: 229 lb (103.9 kg)  Height: 4\' 8"  (1.422 m)   Body mass index is 51.34 kg/m.     10/23/2022   11:32 AM 11/07/2021    8:19 AM 10/20/2021   11:40 AM 09/27/2021   12:48 PM 06/07/2021    7:03 PM 07/31/2018   10:25 AM 08/08/2017    8:18 AM  Advanced Directives  Does Patient Have a Medical Advance Directive? No Yes No Yes No No No  Type of Advance Directive    Healthcare Power of Attorney     Does patient want to make changes to medical advance directive?  No - Patient declined  No - Guardian declined     Copy of Healthcare Power of Attorney in Chart?    No - copy requested     Would patient like information on creating a medical advance directive? No - Patient declined  No - Patient declined  No - Patient declined No - Patient declined No - Patient declined    Current Medications (verified) Outpatient Encounter Medications as of 10/23/2022  Medication Sig   allopurinol (ZYLOPRIM) 100 MG tablet Take 1 tablet (100 mg total) by mouth at  bedtime.   aspirin EC 81 MG tablet Take 1 tablet (81 mg total) by mouth daily. Swallow whole.   clobetasol cream (TEMOVATE) 0.05 % APPLY TO SKIN RASH 2 TIMES DAILY AS NEEDED. MAX 2 WEEKS AT A TIME, AVOID FACE, UNDERARMS, GROIN.   Ibuprofen 200 MG CAPS Take 3 capsules (600 mg total) by mouth 2 (two) times daily as needed.   levETIRAcetam (KEPPRA) 500 MG tablet TAKE 1 TABLET BY MOUTH TWICE A DAY   levothyroxine (SYNTHROID) 125 MCG tablet TAKE 1 TABLET BY MOUTH EVERY DAY BEFORE BREAKFAST   memantine (NAMENDA) 10 MG tablet TAKE 1 TABLET BY MOUTH TWICE A DAY   Multiple Vitamins-Minerals (WOMENS MULTI PO) Take 1 tablet by mouth at bedtime.   silver sulfADIAZINE (SILVADENE) 1 % cream Apply 1 application. topically daily.   traMADol (ULTRAM) 50 MG tablet TAKE 1 TABLET (50 MG TOTAL) BY MOUTH TWICE A DAY AS NEEDED FOR MODERATE PAIN   acetaminophen (TYLENOL) 650 MG CR tablet Take 1,300 mg by mouth every 8 (eight) hours as needed for pain. (Patient not taking: Reported on 10/23/2022)   No facility-administered encounter medications on file as of 10/23/2022.    Allergies (verified) Patient has no known allergies.   History: Past Medical History:  Diagnosis Date   Bruise    per pt mother bruising left leg post op laser procedure on 10-25-2021  Chronic idiopathic gout of knee without tophus    followed by pcp----   affects left knee (10-28-2021  per pt mother last flare-up several yrs ago)   Dementia due to another medical condition [facility])    neurologist--- dr [name];   secondary to down syndrome (Trisomy 21-related),  nl CT head 2010, did not tolerate aricept   Down syndrome    Eczema    Edema of both lower extremities    Endometrial hyperplasia    H/O pleural empyema 09/24/2011   acute  in setting CAP /   s/p left  VATS with drainage empyema,  post op acute respiratory failure --- resolved   History of DVT of lower extremity 05/11/2021   dx left lower extremity popliteal vein,  completed 3 months  eliquis then changed to ASA 81mg    History of recurrent pneumonia    multiple pneumonias   Hypothyroidism    MDD (major depressive disorder)    OA (osteoarthritis)    bilat knee arthritis, had 3/3 Supartz injections [name])   OCD (obsessive compulsive disorder)    OSA on CPAP 05/2012   folowed by dr Craige Cotta---- study in epic, severe OSA with AHI 118, desat nadir to 67%   (per pt mother pt uses nightly)   Pre-diabetes    Seizure disorder [facility]) 2013   neurologist--- dr [name];   (10-28-2021  per pt mother no seizure since 2013)    long hospitalization, x 2 ; "very mild"; during hospitalization "while coming off vent"   Varicose veins of both lower extremities    followed by dr j. dew ---   with venous reflux,   10-25-2021  s/p laser procedure left leg GSV and SF   Wears glasses    Past Surgical History:  Procedure Laterality Date   ACHILLES TENDON REPAIR Right 01/2017   Dr[facility]   ACHILLES TENDON REPAIR Right 01/2017   ANKLE ARTHROSCOPY Right 11/11/2020   @[facility]  by dr Logan Bores;    debridement and stablilzation lateral collateral ligament   ENDOSCOPIC VEIN LASER TREATMENT Left 10/25/2021   by dr j. dew;   left leg GSV and SF for venous reflux   FLEXIBLE BRONCHOSCOPY  09/24/2011   Procedure: FLEXIBLE BRONCHOSCOPY;  Surgeon: Purcell Nails, MD;  Location: [facility] OR;  Service: Thoracic;  Laterality: N/A;   HYSTEROSCOPY WITH D & C N/A 09/27/2021   Procedure: DILATATION AND CURETTAGE /HYSTEROSCOPY;  Surgeon: Reva Bores, MD;  Location: [facility] OR;  Service: Gynecology;  Laterality: N/A;   INTRAUTERINE DEVICE (IUD) INSERTION N/A 11/07/2021   Procedure: INTRAUTERINE DEVICE (IUD) INSERTION;  Surgeon: Reva Bores, MD;  Location: [facility];  Service: Gynecology;  Laterality: N/A;   LUMBAR DISC SURGERY  07/18/2004   @[facility]  by dr Wynetta Emery;   L4--5 disectomy and left L3--4 nerve decompression   RETINAL LASER PROCEDURE Left 09/2022   TRACHEOSTOMY TUBE PLACEMENT  10/11/2011    Procedure: TRACHEOSTOMY;  Surgeon: Serena Colonel, MD;  Location: [facility] OR;  Service: ENT;  Laterality: N/A;   VIDEO ASSISTED THORACOSCOPY (VATS) /THOROCOTOMY/RADIOACTIVE SEED IMPLANT Left 09/24/2011   VATS, (Dr. Barry Dienes)   Family History  Problem Relation Age of Onset   Arthritis Mother    Hypertension Father    Diabetes Father    Cancer Neg Hx    Coronary artery disease Neg Hx    Stroke Neg Hx    Social History   Socioeconomic History   Marital status: Single    Spouse name: Not  on file   Number of children: 0   Years of education: Not on file   Highest education level: Not on file  Occupational History   Not on file  Tobacco Use   Smoking status: Never   Smokeless tobacco: Never  Vaping Use   Vaping status: Never Used  Substance and Sexual Activity   Alcohol use: Never   Drug use: Never   Sexual activity: Never    Birth control/protection: None  Other Topics Concern   Not on file  Social History Narrative   Lives with mom and dad, dogs   Stays at daycare during the day   Activity: walking some   Diet: good water, fruits/vegetables daily   Social Determinants of Health   Financial Resource Strain: Low Risk  (10/23/2022)   Overall Financial Resource Strain (CARDIA)    Difficulty of Paying Living Expenses: Not hard at all  Food Insecurity: No Food Insecurity (10/23/2022)   Hunger Vital Sign    Worried About Running Out of Food in the Last Year: Never true    Ran Out of Food in the Last Year: Never true  Transportation Needs: No Transportation Needs (10/23/2022)   PRAPARE - Administrator, Civil Service (Medical): No    Lack of Transportation (Non-Medical): No  Physical Activity: Inactive (10/23/2022)   Exercise Vital Sign    Days of Exercise per Week: 0 days    Minutes of Exercise per Session: 0 min  Stress: Patient Unable To Answer (10/23/2022)   [employer] of Occupational Health - Occupational Stress Questionnaire    Feeling of Stress : Patient  unable to answer  Social Connections: Socially Isolated (10/23/2022)   Social Connection and Isolation Panel [NHANES]    Frequency of Communication with Friends and Family: More than three times a week    Frequency of Social Gatherings with Friends and Family: More than three times a week    Attends Religious Services: Never    Database administrator or Organizations: No    Attends Engineer, structural: Never    Marital Status: Never married    Tobacco Counseling Counseling given: Not Answered   Clinical Intake:  Pre-visit preparation completed: Yes  Pain : No/denies pain (Caregiver states pt is in pain, pt unable to cummunicate pain level)     BMI - recorded: 51.34 Nutritional Status: BMI > 30  Obese Nutritional Risks: None Diabetes: No  How often do you need to have someone help you when you read instructions, pamphlets, or other written materials from your doctor or pharmacy?: 5 - Always What is the last grade level you completed in school?: Down Syndrome went to school until age 53  Interpreter Needed?: No  Information entered by :: C.Adelin Ventrella LPN   Activities of Daily Living    10/23/2022   11:33 AM 11/07/2021    8:24 AM  In your present state of health, do you have any difficulty performing the following activities:  Hearing? 0 0  Vision? 0 0  Difficulty concentrating or making decisions? 1 1  Comment Has dementia per mother   Walking or climbing stairs? 1 1  Dressing or bathing? 1 0  Comment mother assists   Doing errands, shopping? 1   Comment mother   Preparing Food and eating ? Y   Comment mother assists   Using the Toilet? Y   In the past six months, have you accidently leaked urine? N   Do you have problems with  loss of bowel control? N   Managing your Medications? Y   Managing your Finances? Y   Housekeeping or managing your Housekeeping? Y     Patient Care Team: Eustaquio Boyden, MD as PCP - General (Family Medicine) Arville Go, MD  as Referring Physician (Internal Medicine)  Indicate any recent Medical Services you may have received from other than [facility] providers in the past year (date may be approximate).     Assessment:   This is a routine wellness examination for Aviona.  Hearing/Vision screen Hearing Screening - Comments:: Denies hearing difficulties  But does get was buildup Vision Screening - Comments:: Glasses - Dr.Groat - Up to date on eye exams also sees Dr.Zimmerman at [facility]  Dietary issues and exercise activities discussed:     Goals Addressed             This Visit's Progress    Patient Stated       Increase activity as tolerated       Depression Screen    10/23/2022   11:25 AM 10/20/2021   11:38 AM 10/18/2020    2:18 PM 09/24/2019    2:02 PM 08/08/2017    8:15 AM 09/08/2016    3:16 PM 09/27/2015   10:29 AM  PHQ 2/9 Scores  PHQ - 2 Score  0 0 0 0 0 0  PHQ- 9 Score  0 0  0    Exception Documentation Medical reason          Fall Risk    10/23/2022   11:33 AM 10/20/2021   11:40 AM 10/18/2020    2:18 PM 07/31/2018   10:25 AM 08/08/2017    8:15 AM  Fall Risk   Falls in the past year? 0 0 0 0 No  Number falls in past yr: 0 0 0    Injury with Fall? 0 0 0    Risk for fall due to : No Fall Risks No Fall Risks     Follow up Falls prevention discussed;Falls evaluation completed Falls evaluation completed       MEDICARE RISK AT HOME: Medicare Risk at Home Any stairs in or around the home?: Yes If so, are there any without handrails?: Yes (fromt of house) Home free of loose throw rugs in walkways, pet beds, electrical cords, etc?: Yes Adequate lighting in your home to reduce risk of falls?: Yes Life alert?: No Use of a cane, walker or w/c?: No Grab bars in the bathroom?: Yes (in shower) Shower chair or bench in shower?: Yes Elevated toilet seat or a handicapped toilet?: Yes  TIMED UP AND GO:  Was the test performed?  No    Cognitive Function:    07/31/2018     3:50 PM 08/08/2017    9:58 AM  MMSE - Mini Mental State Exam  Not completed: -- --        10/23/2022   11:37 AM  6CIT Screen  What Year? 4 points  What month? 3 points  What time? 3 points  Count back from 20 4 points  Months in reverse 4 points  Repeat phrase 10 points  Total Score 28 points  PT unable to complete 6CIT  Immunizations Immunization History  Administered Date(s) Administered   Influenza Split 12/05/2011   Influenza, Seasonal, Injecte, Preservative Fre 02/13/2013   Influenza,inj,Quad PF,6+ Mos 12/04/2013, 11/19/2014, 11/17/2015, 12/14/2016, 02/02/2018, 10/28/2018, 11/30/2020, 01/17/2022   PFIZER(Purple Top)SARS-COV-2 Vaccination 05/29/2019, 06/19/2019   Pfizer Covid-19 Vaccine Bivalent Booster 90yrs & up  11/30/2020   Pneumococcal Polysaccharide-23 12/05/2011   Tdap 02/13/2013    TDAP status: Up to date  Flu Vaccine status: Due, Education has been provided regarding the importance of this vaccine. Advised may receive this vaccine at local pharmacy or Health Dept. Aware to provide a copy of the vaccination record if obtained from local pharmacy or Health Dept. Verbalized acceptance and understanding.  Pneumococcal vaccine status: Due, Education has been provided regarding the importance of this vaccine. Advised may receive this vaccine at local pharmacy or Health Dept. Aware to provide a copy of the vaccination record if obtained from local pharmacy or Health Dept. Verbalized acceptance and understanding.  Covid-19 vaccine status: Declined, Education has been provided regarding the importance of this vaccine but patient still declined. Advised may receive this vaccine at local pharmacy or Health Dept.or vaccine clinic. Aware to provide a copy of the vaccination record if obtained from local pharmacy or Health Dept. Verbalized acceptance and understanding.  Qualifies for Shingles Vaccine? Yes   Zostavax completed No   Shingrix Completed?: No.    Education has been  provided regarding the importance of this vaccine. Patient has been advised to call insurance company to determine out of pocket expense if they have not yet received this vaccine. Advised may also receive vaccine at local pharmacy or Health Dept. Verbalized acceptance and understanding.  Screening Tests Health Maintenance  Topic Date Due   HIV Screening  Never done   Zoster Vaccines- Shingrix (1 of 2) Never done   COVID-19 Vaccine (4 - 2023-24 season) 10/28/2021   INFLUENZA VACCINE  09/28/2022   Colonoscopy  01/18/2023 (Originally 09/12/2015)   DTaP/Tdap/Td (2 - Td or Tdap) 02/14/2023   MAMMOGRAM  08/29/2023   Medicare Annual Wellness (AWV)  10/23/2023   PAP SMEAR-Modifier  09/27/2024   Hepatitis C Screening  Completed   HPV VACCINES  Aged Out    Health Maintenance  Health Maintenance Due  Topic Date Due   HIV Screening  Never done   Zoster Vaccines- Shingrix (1 of 2) Never done   COVID-19 Vaccine (4 - 2023-24 season) 10/28/2021   INFLUENZA VACCINE  09/28/2022   Colorectal screening NEVER DONE - Declined by caregiver Belva Agee.  Mammogram status: Completed 08/29/22. Repeat every year   Lung Cancer Screening: (Low Dose CT Chest recommended if Age 52-80 years, 20 pack-year currently smoking OR have quit w/in 15years.) does not qualify.   Lung Cancer Screening Referral:    Additional Screening:  Hepatitis C Screening: does qualify; Completed 10/03/11  Vision Screening: Recommended annual ophthalmology exams for early detection of glaucoma and other disorders of the eye. Is the patient up to date with their annual eye exam?  Yes  Who is the provider or what is the name of the office in which the patient attends annual eye exams? Dr.Groat If pt is not established with a provider, would they like to be referred to a provider to establish care? No .   Dental Screening: Recommended annual dental exams for proper oral hygiene  Diabetic Foot Exam:   Community Resource  Referral / Chronic Care Management: CRR required this visit?  No   CCM required this visit?  No     Plan:     I have personally reviewed and noted the following in the patient's chart:   Medical and social history Use of alcohol, tobacco or illicit drugs  Current medications and supplements including opioid prescriptions. Patient is currently taking opioid prescriptions. Information provided to patient regarding  non-opioid alternatives. Patient advised to discuss non-opioid treatment plan with their provider. Functional ability and status Nutritional status Physical activity Advanced directives List of other physicians Hospitalizations, surgeries, and ER visits in previous 12 months Vitals Screenings to include cognitive, depression, and falls Referrals and appointments  In addition, I have reviewed and discussed with patient certain preventive protocols, quality metrics, and best practice recommendations. A written personalized care plan for preventive services as well as general preventive health recommendations were provided to patient.     Maryan Puls, LPN   04/12/863   After Visit Summary: (MyChart) Due to this being a telephonic visit, the after visit summary with patients personalized plan was offered to patient via MyChart   Nurse Notes: Pt unable to complete portions of the AWV due to Dementia and [name]. AWV completed with Caregiver's assistance.

## 2022-10-23 NOTE — Patient Instructions (Addendum)
[name] , Thank you for taking time to come for your Medicare Wellness Visit. I appreciate your ongoing commitment to your health goals. Please review the following plan we discussed and let me know if I can assist you in the future.   Referrals/Orders/Follow-Ups/Clinician Recommendations: Aim for 30 minutes of exercise or brisk walking, 6-8 glasses of water, and 5 servings of fruits and vegetables each day.   Managing Pain Without Opioids Opioids are strong medicines used to treat moderate to severe pain. For some people, especially those who have long-term (chronic) pain, opioids may not be the best choice for pain management due to: Side effects like nausea, constipation, and sleepiness. The risk of addiction (opioid use disorder). The longer you take opioids, the greater your risk of addiction. Pain that lasts for more than 3 months is called chronic pain. Managing chronic pain usually requires more than one approach and is often provided by a team of health care providers working together (multidisciplinary approach). Pain management may be done at a pain management center or pain clinic. How to manage pain without the use of opioids Use non-opioid medicines Non-opioid medicines for pain may include: Over-the-counter or prescription non-steroidal anti-inflammatory drugs (NSAIDs). These may be the first medicines used for pain. They work well for muscle and bone pain, and they reduce swelling. Acetaminophen. This over-the-counter medicine may work well for milder pain but not swelling. Antidepressants. These may be used to treat chronic pain. A certain type of antidepressant (tricyclics) is often used. These medicines are given in lower doses for pain than when used for depression. Anticonvulsants. These are usually used to treat seizures but may also reduce nerve (neuropathic) pain. Muscle relaxants. These relieve pain caused by sudden muscle tightening (spasms). You may also use a pain  medicine that is applied to the skin as a patch, cream, or gel (topical analgesic), such as a numbing medicine. These may cause fewer side effects than medicines taken by mouth. Do certain therapies as directed Some therapies can help with pain management. They include: Physical therapy. You will do exercises to gain strength and flexibility. A physical therapist may teach you exercises to move and stretch parts of your body that are weak, stiff, or painful. You can learn these exercises at physical therapy visits and practice them at home. Physical therapy may also involve: Massage. Heat wraps or applying heat or cold to affected areas. Electrical signals that interrupt pain signals (transcutaneous electrical nerve stimulation, TENS). Weak lasers that reduce pain and swelling (low-level laser therapy). Signals from your body that help you learn to regulate pain (biofeedback). Occupational therapy. This helps you to learn ways to function at home and work with less pain. Recreational therapy. This involves trying new activities or hobbies, such as a physical activity or drawing. Mental health therapy, including: Cognitive behavioral therapy (CBT). This helps you learn coping skills for dealing with pain. Acceptance and commitment therapy (ACT) to change the way you think and react to pain. Relaxation therapies, including muscle relaxation exercises and mindfulness-based stress reduction. Pain management counseling. This may be individual, family, or group counseling.  Receive medical treatments Medical treatments for pain management include: Nerve block injections. These may include a pain blocker and anti-inflammatory medicines. You may have injections: Near the spine to relieve chronic back or neck pain. Into joints to relieve back or joint pain. Into nerve areas that supply a painful area to relieve body pain. Into muscles (trigger point injections) to relieve some painful muscle  conditions. A medical device placed near your spine to help block pain signals and relieve nerve pain or chronic back pain (spinal cord stimulation device). Acupuncture. Follow these instructions at home Medicines Take over-the-counter and prescription medicines only as told by your health care provider. If you are taking pain medicine, ask your health care providers about possible side effects to watch out for. Do not drive or use heavy machinery while taking prescription opioid pain medicine. Lifestyle  Do not use drugs or alcohol to reduce pain. If you drink alcohol, limit how much you have to: 0-1 drink a day for women who are not pregnant. 0-2 drinks a day for men. Know how much alcohol is in a drink. In the [place] one drink equals one 12 oz bottle of beer (355 mL), one 5 oz glass of wine (148 mL), or one 1 oz glass of hard liquor (44 mL). Do not use any products that contain nicotine or tobacco. These products include cigarettes, chewing tobacco, and vaping devices, such as e-cigarettes. If you need help quitting, ask your health care provider. Eat a healthy diet and maintain a healthy weight. Poor diet and excess weight may make pain worse. Eat foods that are high in fiber. These include fresh fruits and vegetables, whole grains, and beans. Limit foods that are high in fat and processed sugars, such as fried and sweet foods. Exercise regularly. Exercise lowers stress and may help relieve pain. Ask your health care provider what activities and exercises are safe for you. If your health care provider approves, join an exercise class that combines movement and stress reduction. Examples include yoga and tai chi. Get enough sleep. Lack of sleep may make pain worse. Lower stress as much as possible. Practice stress reduction techniques as told by your therapist. General instructions Work with all your pain management providers to find the treatments that work best for you. You are an  important member of your pain management team. There are many things you can do to reduce pain on your own. Consider joining an online or in-person support group for people who have chronic pain. Keep all follow-up visits. This is important. Where to find more information You can find more information about managing pain without opioids from: American Academy of Pain Medicine: painmed.org Institute for Chronic Pain: instituteforchronicpain.org American Chronic Pain Association: theacpa.org Contact a health care provider if: You have side effects from pain medicine. Your pain gets worse or does not get better with treatments or home therapy. You are struggling with anxiety or depression. Summary Many types of pain can be managed without opioids. Chronic pain may respond better to pain management without opioids. Pain is best managed when you and a team of health care providers work together. Pain management without opioids may include non-opioid medicines, medical treatments, physical therapy, mental health therapy, and lifestyle changes. Tell your health care providers if your pain gets worse or is not being managed well enough. This information is not intended to replace advice given to you by your health care provider. Make sure you discuss any questions you have with your health care provider. Document Revised: 05/26/2020 Document Reviewed: 05/26/2020 Elsevier Patient Education  2024 Elsevier Inc.   This is a list of the screening recommended for you and due dates:  Health Maintenance  Topic Date Due   HIV Screening  Never done   Zoster (Shingles) Vaccine (1 of 2) Never done   COVID-19 Vaccine (4 - 2023-24 season) 10/28/2021   Flu Shot  09/28/2022   Colon Cancer Screening  01/18/2023*   DTaP/Tdap/Td vaccine (2 - Td or Tdap) 02/14/2023   Mammogram  08/29/2023   Medicare Annual Wellness Visit  10/23/2023   Pap Smear  09/27/2024   Hepatitis C Screening  Completed   HPV Vaccine   Aged Out  *Topic was postponed. The date shown is not the original due date.    Advanced directives: (Declined) Advance directive discussed with you today. Even though you declined this today, please call our office should you change your mind, and we can give you the proper paperwork for you to fill out.  Next Medicare Annual Wellness Visit scheduled for next year: Yes

## 2022-10-29 DIAGNOSIS — G4733 Obstructive sleep apnea (adult) (pediatric): Secondary | ICD-10-CM | POA: Diagnosis not present

## 2022-11-07 ENCOUNTER — Other Ambulatory Visit (INDEPENDENT_AMBULATORY_CARE_PROVIDER_SITE_OTHER): Payer: Self-pay | Admitting: Nurse Practitioner

## 2022-11-07 DIAGNOSIS — M79604 Pain in right leg: Secondary | ICD-10-CM

## 2022-11-08 ENCOUNTER — Ambulatory Visit (INDEPENDENT_AMBULATORY_CARE_PROVIDER_SITE_OTHER): Payer: Medicare PPO

## 2022-11-08 ENCOUNTER — Encounter (INDEPENDENT_AMBULATORY_CARE_PROVIDER_SITE_OTHER): Payer: Self-pay | Admitting: Nurse Practitioner

## 2022-11-08 ENCOUNTER — Ambulatory Visit (INDEPENDENT_AMBULATORY_CARE_PROVIDER_SITE_OTHER): Payer: Medicare PPO | Admitting: Nurse Practitioner

## 2022-11-08 VITALS — BP 109/78 | HR 63 | Resp 18 | Ht <= 58 in | Wt 249.0 lb

## 2022-11-08 DIAGNOSIS — M79605 Pain in left leg: Secondary | ICD-10-CM

## 2022-11-08 DIAGNOSIS — I890 Lymphedema, not elsewhere classified: Secondary | ICD-10-CM

## 2022-11-08 DIAGNOSIS — M79604 Pain in right leg: Secondary | ICD-10-CM

## 2022-11-08 DIAGNOSIS — I517 Cardiomegaly: Secondary | ICD-10-CM

## 2022-11-08 NOTE — Progress Notes (Signed)
Subjective:    Patient ID: [name], female    DOB: [birth year], 52 y.o.   MRN: [id number] Chief Complaint  Patient presents with   Follow-up    pt conv ABI    [name] is a 52 year old female who returns today for follow-up evaluation of pain in her bilateral lower extremities.  Since her last visit the pain has improved.  She is describing pain that was worse at night and kept her from sleeping laying flat and the only night.  It was concerning for possible breast pain.  The patient also had issues with her ankle and arthralgias as well in that area.  But since her last visit she has been compliant with utilizing medical grade compression and elevation in addition to using her lymphedema pump and the pain is not as excessive as it was when we last saw the patient.  Today noninvasive studies show an ABI of 1.14 on the right and 1.09 on the left.  Toe pressures are normal bilaterally with triphasic tibial artery waveforms and good toe waveforms bilaterally.    Review of Systems  Cardiovascular:  Positive for leg swelling.  Musculoskeletal:  Positive for arthralgias and gait problem.  All other systems reviewed and are negative.      Objective:   Physical Exam Vitals reviewed.  HENT:     Head: Normocephalic.  Cardiovascular:     Rate and Rhythm: Normal rate.  Pulmonary:     Effort: Pulmonary effort is normal.  Musculoskeletal:     Right lower leg: Edema present.     Left lower leg: Edema present.  Skin:    General: Skin is warm and dry.  Neurological:     Mental Status: She is alert. Mental status is at baseline.     Gait: Gait abnormal.     BP 109/78 (BP Location: Right Arm)   Pulse 63   Resp 18   Ht 4\' 8"  (1.422 m)   Wt 249 lb (112.9 kg)   BMI 55.82 kg/m   Past Medical History:  Diagnosis Date   Bruise    per pt mother bruising left leg post op laser procedure on 10-25-2021   Chronic idiopathic gout of knee without tophus    followed by pcp----    affects left knee (10-28-2021  per pt mother last flare-up several yrs ago)   Dementia due to another medical condition [facility])    neurologist--- dr [name];   secondary to down syndrome (Trisomy 21-related),  nl CT head 2010, did not tolerate aricept   Down syndrome    Eczema    Edema of both lower extremities    Endometrial hyperplasia    H/O pleural empyema 09/24/2011   acute  in setting CAP /   s/p left  VATS with drainage empyema,  post op acute respiratory failure --- resolved   History of DVT of lower extremity 05/11/2021   dx left lower extremity popliteal vein,  completed 3 months eliquis then changed to ASA 81mg    History of recurrent pneumonia    multiple pneumonias   Hypothyroidism    MDD (major depressive disorder)    OA (osteoarthritis)    bilat knee arthritis, had 3/3 Supartz injections [name])   OCD (obsessive compulsive disorder)    OSA on CPAP 05/2012   folowed by dr Craige Cotta---- study in epic, severe OSA with AHI 118, desat nadir to 67%   (per pt mother pt uses nightly)   Pre-diabetes  Seizure disorder [facility]) 2013   neurologist--- dr [name];   (10-28-2021  per pt mother no seizure since 2013)    long hospitalization, x 2 ; "very mild"; during hospitalization "while coming off vent"   Varicose veins of both lower extremities    followed by dr j. dew ---   with venous reflux,   10-25-2021  s/p laser procedure left leg GSV and SF   Wears glasses     Social History   Socioeconomic History   Marital status: Single    Spouse name: Not on file   Number of children: 0   Years of education: Not on file   Highest education level: Not on file  Occupational History   Not on file  Tobacco Use   Smoking status: Never   Smokeless tobacco: Never  Vaping Use   Vaping status: Never Used  Substance and Sexual Activity   Alcohol use: Never   Drug use: Never   Sexual activity: Never    Birth control/protection: None  Other Topics Concern   Not on file  Social History  Narrative   Lives with mom and dad, dogs   Stays at daycare during the day   Activity: walking some   Diet: good water, fruits/vegetables daily   Social Determinants of Health   Financial Resource Strain: Low Risk  (10/23/2022)   Overall Financial Resource Strain (CARDIA)    Difficulty of Paying Living Expenses: Not hard at all  Food Insecurity: No Food Insecurity (10/23/2022)   Hunger Vital Sign    Worried About Running Out of Food in the Last Year: Never true    Ran Out of Food in the Last Year: Never true  Transportation Needs: No Transportation Needs (10/23/2022)   PRAPARE - Administrator, Civil Service (Medical): No    Lack of Transportation (Non-Medical): No  Physical Activity: Inactive (10/23/2022)   Exercise Vital Sign    Days of Exercise per Week: 0 days    Minutes of Exercise per Session: 0 min  Stress: Patient Unable To Answer (10/23/2022)   [employer] of Occupational Health - Occupational Stress Questionnaire    Feeling of Stress : Patient unable to answer  Social Connections: Socially Isolated (10/23/2022)   Social Connection and Isolation Panel [NHANES]    Frequency of Communication with Friends and Family: More than three times a week    Frequency of Social Gatherings with Friends and Family: More than three times a week    Attends Religious Services: Never    Database administrator or Organizations: No    Attends Banker Meetings: Never    Marital Status: Never married  Intimate Partner Violence: Patient Unable To Answer (10/23/2022)   Humiliation, Afraid, Rape, and Kick questionnaire    Fear of Current or Ex-Partner: Patient unable to answer    Emotionally Abused: Patient unable to answer    Physically Abused: Patient unable to answer    Sexually Abused: Patient unable to answer    Past Surgical History:  Procedure Laterality Date   ACHILLES TENDON REPAIR Right 01/2017   Dr. [facility]   ACHILLES TENDON REPAIR Right  01/2017   ANKLE ARTHROSCOPY Right 11/11/2020   @[facility]  by dr Logan Bores;    debridement and stablilzation lateral collateral ligament   ENDOSCOPIC VEIN LASER TREATMENT Left 10/25/2021   by dr j. dew;   left leg GSV and SF for venous reflux   FLEXIBLE BRONCHOSCOPY  09/24/2011   Procedure: FLEXIBLE  BRONCHOSCOPY;  Surgeon: Purcell Nails, MD;  Location: [facility] OR;  Service: Thoracic;  Laterality: N/A;   HYSTEROSCOPY WITH D & C N/A 09/27/2021   Procedure: DILATATION AND CURETTAGE /HYSTEROSCOPY;  Surgeon: Reva Bores, MD;  Location: [facility] OR;  Service: Gynecology;  Laterality: N/A;   INTRAUTERINE DEVICE (IUD) INSERTION N/A 11/07/2021   Procedure: INTRAUTERINE DEVICE (IUD) INSERTION;  Surgeon: Reva Bores, MD;  Location: [facility];  Service: Gynecology;  Laterality: N/A;   LUMBAR DISC SURGERY  07/18/2004   @[facility]  by dr Wynetta Emery;   L4--5 disectomy and left L3--4 nerve decompression   RETINAL LASER PROCEDURE Left 09/2022   TRACHEOSTOMY TUBE PLACEMENT  10/11/2011   Procedure: TRACHEOSTOMY;  Surgeon: Serena Colonel, MD;  Location: [facility] OR;  Service: ENT;  Laterality: N/A;   VIDEO ASSISTED THORACOSCOPY (VATS) /THOROCOTOMY/RADIOACTIVE SEED IMPLANT Left 09/24/2011   VATS, (Dr. Barry Dienes)    Family History  Problem Relation Age of Onset   Arthritis Mother    Hypertension Father    Diabetes Father    Cancer Neg Hx    Coronary artery disease Neg Hx    Stroke Neg Hx     No Known Allergies     Latest Ref Rng & Units 01/10/2022    7:59 AM 11/07/2021    8:00 AM 09/27/2021    1:06 PM  CBC  WBC 4.0 - 10.5 K/uL 4.3  4.8  4.9   Hemoglobin 12.0 - 15.0 g/dL 16.1  09.6  04.5   Hematocrit 36.0 - 46.0 % 41.5  44.0  40.6   Platelets 150.0 - 400.0 K/uL 200.0  169  220       CMP     Component Value Date/Time   NA 138 01/10/2022 0759   K 4.4 01/10/2022 0759   CL 102 01/10/2022 0759   CO2 31 01/10/2022 0759   GLUCOSE 98 01/10/2022 0759   BUN 21 01/10/2022 0759   CREATININE 0.97 01/10/2022 0759   CALCIUM  8.9 01/10/2022 0759   PROT 7.0 01/10/2022 0759   ALBUMIN 4.1 01/10/2022 0759   AST 18 01/10/2022 0759   ALT 17 01/10/2022 0759   ALKPHOS 50 01/10/2022 0759   BILITOT 0.2 01/10/2022 0759   GFR 67.74 01/10/2022 0759   GFRNONAA >60 10/06/2019 1655     No results found.     Assessment & Plan:   1. Lymphedema Patient is currently compliant with compression socks and lymphedema although she is not fond of it.  I had a long discussion with the patient and her mother regarding the use of conservative therapies to help control the discomfort from swelling in addition to preventing any open wounds or ulcerations.  As previously discussed the ablation that was done on the right lower extremity seems to have recannulated and so the patient continues to have worsening issues with swelling we may reconsider a repeat laser but the patient tolerated this very poorly I would only do this if absolutely necessary.  Patient will continue with her conservative therapy and will have her follow-up in 6 months or sooner if issues arise.  2. Pain in both lower extremities Pain noninvasive ABIs are normal bilaterally.  Based on her description and her history I suspect the pain is ongoing if the leg is more so related to her ongoing ankle issues.  The patient will reach out to her orthopedic specialist to see if she is able to receive a gel shot to help in this area.  3. Cardiomegaly Certainly  can worsen lymphedema   Current Outpatient Medications on File Prior to Visit  Medication Sig Dispense Refill   allopurinol (ZYLOPRIM) 100 MG tablet Take 1 tablet (100 mg total) by mouth at bedtime. 90 tablet 3   aspirin EC 81 MG tablet Take 1 tablet (81 mg total) by mouth daily. Swallow whole.     clobetasol cream (TEMOVATE) 0.05 % APPLY TO SKIN RASH 2 TIMES DAILY AS NEEDED. MAX 2 WEEKS AT A TIME, AVOID FACE, UNDERARMS, GROIN. 45 g 0   Ibuprofen 200 MG CAPS Take 3 capsules (600 mg total) by mouth 2 (two) times daily as  needed.     levETIRAcetam (KEPPRA) 500 MG tablet TAKE 1 TABLET BY MOUTH TWICE A DAY 180 tablet 3   levothyroxine (SYNTHROID) 125 MCG tablet TAKE 1 TABLET BY MOUTH EVERY DAY BEFORE BREAKFAST 90 tablet 1   memantine (NAMENDA) 10 MG tablet TAKE 1 TABLET BY MOUTH TWICE A DAY 180 tablet 3   Multiple Vitamins-Minerals (WOMENS MULTI PO) Take 1 tablet by mouth at bedtime.     silver sulfADIAZINE (SILVADENE) 1 % cream Apply 1 application. topically daily. 50 g 0   traMADol (ULTRAM) 50 MG tablet TAKE 1 TABLET (50 MG TOTAL) BY MOUTH TWICE A DAY AS NEEDED FOR MODERATE PAIN 30 tablet 0   acetaminophen (TYLENOL) 650 MG CR tablet Take 1,300 mg by mouth every 8 (eight) hours as needed for pain. (Patient not taking: Reported on 10/23/2022)     No current facility-administered medications on file prior to visit.    There are no Patient Instructions on file for this visit. No follow-ups on file.   Georgiana Spinner, NP

## 2022-12-02 DIAGNOSIS — G4733 Obstructive sleep apnea (adult) (pediatric): Secondary | ICD-10-CM | POA: Diagnosis not present

## 2022-12-12 ENCOUNTER — Ambulatory Visit: Payer: Medicare PPO | Admitting: Podiatry

## 2022-12-21 NOTE — Telephone Encounter (Signed)
Filled via phone req MS

## 2022-12-27 ENCOUNTER — Ambulatory Visit: Payer: Medicare PPO | Admitting: Podiatry

## 2023-01-02 DIAGNOSIS — G4733 Obstructive sleep apnea (adult) (pediatric): Secondary | ICD-10-CM | POA: Diagnosis not present

## 2023-01-18 ENCOUNTER — Telehealth: Payer: Self-pay

## 2023-01-18 NOTE — Telephone Encounter (Signed)
Pt has CPE on 01/24/23 at 3:30 and comes with mom, [name] (mom/legal guardian). However, mom's CPE is scheduled the same day but at 2:30.   Lvm for mom, [name], asking her call back. Need to see if she is ok to move pt's CPE to 3:00 so they will be back-to-back.

## 2023-01-19 NOTE — Telephone Encounter (Signed)
Mom called back and said that it was okay to move appointment to 300 slot. I wasn't able to put on schedule but I waitlisted the slot.

## 2023-01-19 NOTE — Telephone Encounter (Signed)
Noted. CPE has been moved to 3:00.

## 2023-01-24 ENCOUNTER — Ambulatory Visit (INDEPENDENT_AMBULATORY_CARE_PROVIDER_SITE_OTHER): Payer: Medicare PPO | Admitting: Family Medicine

## 2023-01-24 ENCOUNTER — Encounter: Payer: Self-pay | Admitting: Family Medicine

## 2023-01-24 ENCOUNTER — Encounter: Payer: Medicare PPO | Admitting: Family Medicine

## 2023-01-24 VITALS — BP 118/66 | HR 81 | Temp 97.8°F | Ht <= 58 in | Wt 251.1 lb

## 2023-01-24 DIAGNOSIS — Z0000 Encounter for general adult medical examination without abnormal findings: Secondary | ICD-10-CM | POA: Diagnosis not present

## 2023-01-24 DIAGNOSIS — F3342 Major depressive disorder, recurrent, in full remission: Secondary | ICD-10-CM

## 2023-01-24 DIAGNOSIS — Z6841 Body Mass Index (BMI) 40.0 and over, adult: Secondary | ICD-10-CM

## 2023-01-24 DIAGNOSIS — M1A0620 Idiopathic chronic gout, left knee, without tophus (tophi): Secondary | ICD-10-CM | POA: Diagnosis not present

## 2023-01-24 DIAGNOSIS — D7589 Other specified diseases of blood and blood-forming organs: Secondary | ICD-10-CM

## 2023-01-24 DIAGNOSIS — G4733 Obstructive sleep apnea (adult) (pediatric): Secondary | ICD-10-CM

## 2023-01-24 DIAGNOSIS — M1712 Unilateral primary osteoarthritis, left knee: Secondary | ICD-10-CM | POA: Diagnosis not present

## 2023-01-24 DIAGNOSIS — I890 Lymphedema, not elsewhere classified: Secondary | ICD-10-CM

## 2023-01-24 DIAGNOSIS — H6123 Impacted cerumen, bilateral: Secondary | ICD-10-CM

## 2023-01-24 DIAGNOSIS — G8929 Other chronic pain: Secondary | ICD-10-CM

## 2023-01-24 DIAGNOSIS — E039 Hypothyroidism, unspecified: Secondary | ICD-10-CM

## 2023-01-24 DIAGNOSIS — L219 Seborrheic dermatitis, unspecified: Secondary | ICD-10-CM

## 2023-01-24 DIAGNOSIS — Z7189 Other specified counseling: Secondary | ICD-10-CM | POA: Diagnosis not present

## 2023-01-24 DIAGNOSIS — K5900 Constipation, unspecified: Secondary | ICD-10-CM

## 2023-01-24 DIAGNOSIS — R7303 Prediabetes: Secondary | ICD-10-CM

## 2023-01-24 DIAGNOSIS — Q909 Down syndrome, unspecified: Secondary | ICD-10-CM

## 2023-01-24 DIAGNOSIS — R569 Unspecified convulsions: Secondary | ICD-10-CM

## 2023-01-24 DIAGNOSIS — F0390 Unspecified dementia without behavioral disturbance: Secondary | ICD-10-CM

## 2023-01-24 MED ORDER — LEVOTHYROXINE SODIUM 125 MCG PO TABS: 125.0000 ug | ORAL_TABLET | Freq: Every day | ORAL | 4 refills | Status: DC

## 2023-01-24 MED ORDER — TRAMADOL HCL 50 MG PO TABS: 50.0000 mg | ORAL_TABLET | Freq: Two times a day (BID) | ORAL | 0 refills | Status: AC | PRN

## 2023-01-24 MED ORDER — ACETAMINOPHEN ER 650 MG PO TBCR: 1300.0000 mg | EXTENDED_RELEASE_TABLET | Freq: Two times a day (BID) | ORAL | Status: DC | PRN

## 2023-01-24 MED ORDER — ALLOPURINOL 100 MG PO TABS: 100.0000 mg | ORAL_TABLET | Freq: Every day | ORAL | 4 refills | Status: DC

## 2023-01-24 NOTE — Progress Notes (Signed)
Ph: [phone] Fax: [phone]   Patient ID: [name], female    DOB: Nov 13, 1970, 52 y.o.   MRN: [id number]  This visit was conducted in person.  BP 118/66   Pulse 81   Temp 97.8 F (36.6 C) (Oral)   Ht 4' 8.75" (1.441 m)   Wt 251 lb 2 oz (113.9 kg)   SpO2 97%   BMI 54.82 kg/m    CC: CPE Subjective:   HPI: [name] is a 52 y.o. female presenting on 01/24/2023 for Annual Exam (MCR prt 2 [AWV- 10/23/22].)   Saw health advisor 09/2022 for medicare wellness visit. Note reviewed.   No results found.  Flowsheet Row Clinical Support from 10/20/2021 in [facility]  PHQ-2 Total Score 0          10/23/2022   11:33 AM 10/20/2021   11:40 AM 10/18/2020    2:18 PM 07/31/2018   10:25 AM 08/08/2017    8:15 AM  Fall Risk   Falls in the past year? 0 0 0 0 No  Number falls in past yr: 0 0 0    Injury with Fall? 0 0 0    Risk for fall due to : No Fall Risks No Fall Risks     Follow up Falls prevention discussed;Falls evaluation completed Falls evaluation completed       Chronic leg swelling with lymphedema followed by [facility] s/p laser ablation of L great saphenous vein 09/2021 last seen 10/2022. Continues using compression socks. H/o DVT 04/2021 treated with eliquis. Lymphedema pumps recently started - once daily for 1 hour. Chronic R ankle pain.   OSA - continues autoCPAP through [employer] [facility] followed by pulm  Seizure - yearly neuro eval. Remote seizure stable period on keppra 500mg  bid. Also on namenda 10mg  BID for down syndrome related dementia.    Episode of vaginal bleeding 2023 after eliquis - saw GYN s/p EMB showing endometrial hyperplasia without atypia, treating with progesterone IUD with yearly sampling under anesthesia.    Doing better with stool softener twice daily use.    Preventative: Colon cancer screening - declined colonoscopy. Cologuard pending - they have this at home but have had difficulty completing.  Well woman - with  OBGYN exam under anesthesia - normal pap (absent transformation zone) 09/2021 - due for f/u, will call to schedule LMP irregular - end of July  Mamm[facility] 08/2022 - [facility] Lung cancer screening - not eligible  Flu shot - yearly  COVID vaccine - Pfizer x2 05/2019, booster x1 Pneumovax 2013.  Tdap - 01/2013.  Shingrix - 12/2022 - 2nd pending Advanced directives: mom would be HCPOA - asked to bring [place] a copy Seat belt use discussed.  Sunscreen use discussed. No changing moles.  Non smoker  Alcohol - none  Dentist - due Eye exam Q6 mo - retinologist Dr Ashley Royalty  Bowel - possible constipation doing better on stool softener  Bladder - no incontinence  Memory difficulty - now on namenda Mood - sertraline worsened mood trouble, now off medicines Hearing well   Lives with mom and dad, dogs Stays at home during the day, no longer goes to adult daycare Activity: none - activity limited by pain Diet: some water, likes coke zero, fruits/vegetables daily - encouraged limited sugar/carbs     Relevant past medical, surgical, family and social history reviewed and updated as indicated. Interim medical history since our last visit reviewed. Allergies and medications reviewed  and updated. Outpatient Medications Prior to Visit  Medication Sig Dispense Refill   aspirin EC 81 MG tablet Take 1 tablet (81 mg total) by mouth daily. Swallow whole.     clobetasol cream (TEMOVATE) 0.05 % APPLY TO SKIN RASH 2 TIMES DAILY AS NEEDED. MAX 2 WEEKS AT A TIME, AVOID FACE, UNDERARMS, GROIN. 45 g 0   Ibuprofen 200 MG CAPS Take 3 capsules (600 mg total) by mouth 2 (two) times daily as needed.     levETIRAcetam (KEPPRA) 500 MG tablet TAKE 1 TABLET BY MOUTH TWICE A DAY 180 tablet 3   memantine (NAMENDA) 10 MG tablet TAKE 1 TABLET BY MOUTH TWICE A DAY 180 tablet 3   Multiple Vitamins-Minerals (WOMENS MULTI PO) Take 1 tablet by mouth at bedtime.     allopurinol (ZYLOPRIM) 100 MG tablet Take 1 tablet (100 mg  total) by mouth at bedtime. 90 tablet 3   levothyroxine (SYNTHROID) 125 MCG tablet TAKE 1 TABLET BY MOUTH EVERY DAY BEFORE BREAKFAST 90 tablet 1   silver sulfADIAZINE (SILVADENE) 1 % cream Apply 1 application. topically daily. 50 g 0   traMADol (ULTRAM) 50 MG tablet TAKE 1 TABLET (50 MG TOTAL) BY MOUTH TWICE A DAY AS NEEDED FOR MODERATE PAIN 30 tablet 0   acetaminophen (TYLENOL) 650 MG CR tablet Take 1,300 mg by mouth every 8 (eight) hours as needed for pain. (Patient not taking: Reported on 10/23/2022)     No facility-administered medications prior to visit.     Per HPI unless specifically indicated in ROS section below Review of Systems  Constitutional:  Negative for activity change, appetite change, chills, fatigue, fever and unexpected weight change.  HENT:  Negative for congestion and hearing loss.   Respiratory:  Negative for cough, chest tightness, shortness of breath and wheezing.   Cardiovascular:  Negative for chest pain, palpitations and leg swelling.  Gastrointestinal:  Positive for abdominal distention, abdominal pain and constipation. Negative for blood in stool, diarrhea, nausea and vomiting.  Genitourinary:  Negative for difficulty urinating and hematuria.  Musculoskeletal:  Positive for arthralgias. Negative for myalgias and neck pain.  Skin:  Negative for rash.  Neurological:  Negative for dizziness, seizures, syncope and headaches.  Hematological:  Negative for adenopathy. Does not bruise/bleed easily.    Objective:  BP 118/66   Pulse 81   Temp 97.8 F (36.6 C) (Oral)   Ht 4' 8.75" (1.441 m)   Wt 251 lb 2 oz (113.9 kg)   SpO2 97%   BMI 54.82 kg/m   Wt Readings from Last 3 Encounters:  01/24/23 251 lb 2 oz (113.9 kg)  11/08/22 249 lb (112.9 kg)  10/23/22 229 lb (103.9 kg)      Physical Exam Vitals and nursing note reviewed.  Constitutional:      Appearance: Normal appearance.     Comments: Antalgic gait  HENT:     Head: Normocephalic and atraumatic.      Right Ear: Tympanic membrane, ear canal and external ear normal. There is impacted cerumen.     Left Ear: Tympanic membrane, ear canal and external ear normal. There is impacted cerumen.     Mouth/Throat:     Mouth: Mucous membranes are moist.     Pharynx: Oropharynx is clear. No oropharyngeal exudate or posterior oropharyngeal erythema.  Eyes:     General:        Right eye: No discharge.        Left eye: No discharge.     Extraocular Movements:  Extraocular movements intact.     Conjunctiva/sclera: Conjunctivae normal.     Pupils: Pupils are equal, round, and reactive to light.  Neck:     Thyroid: No thyroid mass or thyromegaly.  Cardiovascular:     Rate and Rhythm: Normal rate and regular rhythm.     Pulses: Normal pulses.     Heart sounds: Normal heart sounds. No murmur heard. Pulmonary:     Effort: Pulmonary effort is normal. No respiratory distress.     Breath sounds: Normal breath sounds. No wheezing, rhonchi or rales.  Abdominal:     General: Bowel sounds are normal.     Palpations: Abdomen is soft. There is no mass.     Tenderness: There is abdominal tenderness (mild-mod) in the right lower quadrant, epigastric area, suprapubic area and left lower quadrant. There is no guarding or rebound. Negative signs include Murphy's sign.     Hernia: No hernia is present.  Musculoskeletal:        General: Swelling present.     Cervical back: Normal range of motion and neck supple. No rigidity.     Right lower leg: Edema (nonpitting) present.     Left lower leg: Edema (nonpitting) present.  Lymphadenopathy:     Cervical: No cervical adenopathy.  Skin:    General: Skin is warm and dry.     Findings: No rash.  Neurological:     General: No focal deficit present.     Mental Status: She is alert. Mental status is at baseline.  Psychiatric:        Mood and Affect: Mood normal.        Behavior: Behavior normal.        Assessment & Plan:   Problem List Items Addressed This Visit      Health maintenance examination - Primary (Chronic)    Preventative protocols reviewed and updated unless pt declined. Discussed healthy diet and lifestyle.       Advanced care planning/counseling discussion (Chronic)    Has this at home. Asked to bring [place] copy.       Hypothyroidism    Update TSH on levothyroxine daily.       Relevant Medications   levothyroxine (SYNTHROID) 125 MCG tablet   Other Relevant Orders   TSH   Seizure (HCC)    Regularly sees neurology. Last seizure 2003 during hospitalization. Continues keppra.       Prediabetes    Update A1c.       Relevant Orders   Comprehensive metabolic panel   Hemoglobin A1c   Lipid panel   MDD (major depressive disorder), recurrent, in full remission (HCC)    Stable period off antidepressant.  Sertraline previously worsened mood.       [name]   Chronic gout    Chronic, update urate on low dose allopurinol.  No recent gout flare.       Relevant Medications   allopurinol (ZYLOPRIM) 100 MG tablet   Other Relevant Orders   Uric acid   OSA on CPAP    Chronic, stable on autoCPAP followed by pulm Dr Craige Cotta. Appreciate their care.       Morbid obesity with BMI of 50.0-59.9, adult (HCC)    Activity limited by joint pains.       Chronic pain of right ankle    S/p R ankle surgery 10/2020 [facility]).      Relevant Medications   acetaminophen (TYLENOL) 650 MG CR tablet   traMADol (ULTRAM) 50 MG tablet  Presenile dementia, uncomplicated (HCC)    Down's related, on namenda followed by neurology       Impacted cerumen    They will call to schedule ENT appt for ear cleaning      Macrocytosis without anemia    Chronic macrocytosis. Update labs.  Normal workup previously       Relevant Orders   CBC with Differential/Platelet   Folate   Vitamin B12   Seborrheic dermatitis of scalp    Rec clotrimazole antifungal cream BID to affected area especially behind R>L ears.  Discussed selsun blue shampoo  to scalp, if not better will Rx ketoconazole shampoo.       Osteoarthritis of left knee    Refilled tramadol. Discussed starting with tylenol      Relevant Medications   allopurinol (ZYLOPRIM) 100 MG tablet   acetaminophen (TYLENOL) 650 MG CR tablet   traMADol (ULTRAM) 50 MG tablet   Lymphedema    Followed by [facility], doing better with compression stockings and lymphedema pump use.      Constipation    Managing with OTC stool softener with benefit.         Meds ordered this encounter  Medications   allopurinol (ZYLOPRIM) 100 MG tablet    Sig: Take 1 tablet (100 mg total) by mouth at bedtime.    Dispense:  90 tablet    Refill:  4   levothyroxine (SYNTHROID) 125 MCG tablet    Sig: Take 1 tablet (125 mcg total) by mouth daily before breakfast.    Dispense:  90 tablet    Refill:  4   acetaminophen (TYLENOL) 650 MG CR tablet    Sig: Take 2 tablets (1,300 mg total) by mouth 2 (two) times daily as needed for pain.   traMADol (ULTRAM) 50 MG tablet    Sig: Take 1 tablet (50 mg total) by mouth 2 (two) times daily as needed for moderate pain (pain score 4-6).    Dispense:  30 tablet    Refill:  0    Not to exceed 5 additional fills before 07/16/2022    Orders Placed This Encounter  Procedures   CBC with Differential/Platelet   Comprehensive metabolic panel   Folate   Hemoglobin A1c   Lipid panel   TSH   Uric acid   Vitamin B12    Patient Instructions  Call to schedule follow up with Dr Tawni Levy office OBGYN.  Complete cologuard you have at home.  Bring me copy of living will  Labs today  Likely seborrheic dermatitis to skin of scalp - use over the counter clotrimazole cream to rash behind ears 1-2 times daily. May use selsun blue shampoo as well twic weekly - if not helpful let me know for prescription shampoo.  Return in 6 months for follow up visit   Follow up plan: Return in about 6 months (around 07/24/2023) for follow up visit.  Eustaquio Boyden, MD

## 2023-01-24 NOTE — Assessment & Plan Note (Addendum)
Regularly sees neurology. Last seizure 2003 during hospitalization. Continues keppra.

## 2023-01-24 NOTE — Assessment & Plan Note (Signed)
Rec clotrimazole antifungal cream BID to affected area especially behind R>L ears.  Discussed selsun blue shampoo to scalp, if not better will Rx ketoconazole shampoo.

## 2023-01-24 NOTE — Assessment & Plan Note (Signed)
Update TSH on levothyroxine daily.

## 2023-01-24 NOTE — Assessment & Plan Note (Signed)
Down's related, on namenda followed by neurology

## 2023-01-24 NOTE — Assessment & Plan Note (Signed)
Refilled tramadol. Discussed starting with tylenol

## 2023-01-24 NOTE — Assessment & Plan Note (Addendum)
Followed by [facility], doing better with compression stockings and lymphedema pump use.

## 2023-01-24 NOTE — Assessment & Plan Note (Signed)
Chronic, stable on autoCPAP followed by pulm Dr Craige Cotta. Appreciate their care.

## 2023-01-24 NOTE — Patient Instructions (Addendum)
Call to schedule follow up with Dr Tawni Levy office OBGYN.  Complete cologuard you have at home.  Bring me copy of living will  Labs today  Likely seborrheic dermatitis to skin of scalp - use over the counter clotrimazole cream to rash behind ears 1-2 times daily. May use selsun blue shampoo as well twic weekly - if not helpful let me know for prescription shampoo.  Return in 6 months for follow up visit

## 2023-01-24 NOTE — Assessment & Plan Note (Addendum)
Activity limited by joint pains.

## 2023-01-24 NOTE — Assessment & Plan Note (Signed)
Stable period off antidepressant.  Sertraline previously worsened mood.

## 2023-01-24 NOTE — Assessment & Plan Note (Signed)
Managing with OTC stool softener with benefit.

## 2023-01-24 NOTE — Assessment & Plan Note (Addendum)
Chronic macrocytosis. Update labs.  Normal workup previously

## 2023-01-24 NOTE — Assessment & Plan Note (Signed)
Preventative protocols reviewed and updated unless pt declined. Discussed healthy diet and lifestyle.  

## 2023-01-24 NOTE — Assessment & Plan Note (Signed)
Has this at home. Asked to bring [place] copy.

## 2023-01-24 NOTE — Assessment & Plan Note (Signed)
They will call to schedule ENT appt for ear cleaning

## 2023-01-24 NOTE — Assessment & Plan Note (Signed)
Update A1c ?

## 2023-01-24 NOTE — Assessment & Plan Note (Signed)
Chronic, update urate on low dose allopurinol.  No recent gout flare.

## 2023-01-24 NOTE — Assessment & Plan Note (Signed)
S/p R ankle surgery 10/2020 [facility]).

## 2023-01-25 LAB — LIPID PANEL
Cholesterol: 165 mg/dL (ref <200)
HDL: 54 mg/dL (ref >=50)
LDL Cholesterol (Calc): 92 mg/dL
Non-HDL Cholesterol (Calc): 111 mg/dL (ref <130)
Total CHOL/HDL Ratio: 3.1 (calc) (ref <5.0)
Triglycerides: 98 mg/dL (ref <150)

## 2023-01-25 LAB — CBC WITH DIFFERENTIAL/PLATELET
Absolute Lymphocytes: 1549 {cells}/uL (ref 850–3900)
Absolute Monocytes: 464 {cells}/uL (ref 200–950)
Basophils Absolute: 98 {cells}/uL (ref 0–200)
Basophils Relative: 1.6 %
Eosinophils Absolute: 73 {cells}/uL (ref 15–500)
Eosinophils Relative: 1.2 %
HCT: 45.2 % — ABNORMAL HIGH (ref 35.0–45.0)
Hemoglobin: 15 g/dL (ref 11.7–15.5)
MCH: 34.9 pg — ABNORMAL HIGH (ref 27.0–33.0)
MCHC: 33.2 g/dL (ref 32.0–36.0)
MCV: 105.1 fL — ABNORMAL HIGH (ref 80.0–100.0)
MPV: 10.8 fL (ref 7.5–12.5)
Monocytes Relative: 7.6 %
Neutro Abs: 3916 {cells}/uL (ref 1500–7800)
Neutrophils Relative %: 64.2 %
Platelets: 248 10*3/uL (ref 140–400)
RBC: 4.3 10*6/uL (ref 3.80–5.10)
RDW: 12.6 % (ref 11.0–15.0)
Total Lymphocyte: 25.4 %
WBC: 6.1 10*3/uL (ref 3.8–10.8)

## 2023-01-25 LAB — COMPREHENSIVE METABOLIC PANEL
AG Ratio: 1.3 (calc) (ref 1.0–2.5)
ALT: 35 U/L — ABNORMAL HIGH (ref 6–29)
AST: 26 U/L (ref 10–35)
Albumin: 4.2 g/dL (ref 3.6–5.1)
Alkaline phosphatase (APISO): 71 U/L (ref 37–153)
BUN/Creatinine Ratio: 12 (calc) (ref 6–22)
BUN: 13 mg/dL (ref 7–25)
CO2: 29 mmol/L (ref 20–32)
Calcium: 9.8 mg/dL (ref 8.6–10.4)
Chloride: 102 mmol/L (ref 98–110)
Creat: 1.08 mg/dL — ABNORMAL HIGH (ref 0.50–1.03)
Globulin: 3.3 g/dL (ref 1.9–3.7)
Glucose, Bld: 112 mg/dL — ABNORMAL HIGH (ref 65–99)
Potassium: 5.1 mmol/L (ref 3.5–5.3)
Sodium: 140 mmol/L (ref 135–146)
Total Bilirubin: 0.6 mg/dL (ref 0.2–1.2)
Total Protein: 7.5 g/dL (ref 6.1–8.1)

## 2023-01-25 LAB — HEMOGLOBIN A1C
Hgb A1c MFr Bld: 6.1 %{Hb} — ABNORMAL HIGH (ref <5.7)
Mean Plasma Glucose: 128 mg/dL
eAG (mmol/L): 7.1 mmol/L

## 2023-01-25 LAB — VITAMIN B12: Vitamin B-12: 686 pg/mL (ref 200–1100)

## 2023-01-25 LAB — TSH: TSH: 1.05 m[IU]/L

## 2023-01-25 LAB — URIC ACID: Uric Acid, Serum: 5.3 mg/dL (ref 2.5–7.0)

## 2023-01-25 LAB — FOLATE: Folate: >24 ng/mL

## 2023-01-28 ENCOUNTER — Other Ambulatory Visit: Payer: Self-pay | Admitting: Family Medicine

## 2023-01-28 DIAGNOSIS — L301 Dyshidrosis [pompholyx]: Secondary | ICD-10-CM

## 2023-02-01 DIAGNOSIS — G4733 Obstructive sleep apnea (adult) (pediatric): Secondary | ICD-10-CM | POA: Diagnosis not present

## 2023-02-14 ENCOUNTER — Encounter (INDEPENDENT_AMBULATORY_CARE_PROVIDER_SITE_OTHER): Payer: Medicare PPO | Admitting: Ophthalmology

## 2023-02-14 DIAGNOSIS — H35373 Puckering of macula, bilateral: Secondary | ICD-10-CM

## 2023-02-14 DIAGNOSIS — H30893 Other chorioretinal inflammations, bilateral: Secondary | ICD-10-CM

## 2023-02-14 DIAGNOSIS — H269 Unspecified cataract: Secondary | ICD-10-CM

## 2023-02-14 DIAGNOSIS — H33102 Unspecified retinoschisis, left eye: Secondary | ICD-10-CM

## 2023-02-26 NOTE — Progress Notes (Signed)
 [facility] - Clinic Note  03/06/2023     CHIEF COMPLAINT Patient presents for Retina Follow Up   HISTORY OF PRESENT ILLNESS: [name] is a 52 y.o. female who presents to the clinic today for:   HPI     Retina Follow Up   Patient presents with  Other.  In both eyes.  Severity is moderate.  Duration of 5 months.  Since onset it is stable.  I, the attending physician,  performed the HPI with the patient and updated documentation appropriately.        Comments   Pt here for 5 mo ret f/u for chorioretinitis OU. Pt states VA is doing fine, no changes. She has a new pair of rx specs. Does not report eye drop use.       Last edited by Valdemar Rogue, MD on 03/06/2023 10:36 PM.    Pt states her vision is fine, she does not report any problems with her vision, she denies eye pain   Referring physician: Octavia Bruckner, MD [address] [place],  [place] [address]   HISTORICAL INFORMATION:   Selected notes from the MEDICAL RECORD NUMBER Referred by Dr. Octavia LEE:  Ocular Hx- granulomatous chorioretinitis OU -- formerly followed by Dr. Skeet annually -- pt stable and wanted to transfer ophthalmologic care closer to home PMH-previously followed by Dr. Alvia (05.15.12) and Dr. Skeet at [facility] (02.11.22)    CURRENT MEDICATIONS: No current outpatient medications on file. (Ophthalmic Drugs)   No current facility-administered medications for this visit. (Ophthalmic Drugs)   Current Outpatient Medications (Other)  Medication Sig   acetaminophen  (TYLENOL ) 650 MG CR tablet Take 2 tablets (1,300 mg total) by mouth 2 (two) times daily as needed for pain.   allopurinol  (ZYLOPRIM ) 100 MG tablet Take 1 tablet (100 mg total) by mouth at bedtime.   aspirin  EC 81 MG tablet Take 1 tablet (81 mg total) by mouth daily. Swallow whole.   clobetasol  cream (TEMOVATE ) 0.05 % APPLY TO SKIN RASH 2 TIMES DAILY AS NEEDED. MAX 2 WEEKS AT A TIME, AVOID FACE, UNDERARMS,  GROIN.   Ibuprofen  200 MG CAPS Take 3 capsules (600 mg total) by mouth 2 (two) times daily as needed.   levETIRAcetam  (KEPPRA ) 500 MG tablet Take 1 tablet (500 mg total) by mouth 2 (two) times daily.   levothyroxine  (SYNTHROID ) 125 MCG tablet Take 1 tablet (125 mcg total) by mouth daily before breakfast.   memantine  (NAMENDA ) 10 MG tablet Take 1 tablet (10 mg total) by mouth 2 (two) times daily.   Multiple Vitamins-Minerals (WOMENS MULTI PO) Take 1 tablet by mouth at bedtime.   traMADol  (ULTRAM ) 50 MG tablet Take 1 tablet (50 mg total) by mouth 2 (two) times daily as needed for moderate pain (pain score 4-6).   No current facility-administered medications for this visit. (Other)   REVIEW OF SYSTEMS: ROS   Positive for: Neurological, Eyes Negative for: Constitutional, Gastrointestinal, Skin, Genitourinary, Musculoskeletal, HENT, Cardiovascular, Respiratory, Allergic/Imm, Heme/Lymph Last edited by Antonetta Almetta BRAVO, COT on 03/06/2023 12:57 PM.       ALLERGIES No Known Allergies  PAST MEDICAL HISTORY Past Medical History:  Diagnosis Date   Bruise    per pt mother bruising left leg post op laser procedure on 10-25-2021   Chronic idiopathic gout of knee without tophus    followed by pcp----   affects left knee (10-28-2021  per pt mother last flare-up several yrs ago)   Dementia  due to another medical condition [facility])    neurologist--- dr rosemarie;   secondary to down syndrome (Trisomy 21-related),  nl CT head 2010, did not tolerate aricept   Down syndrome    Eczema    Edema of both lower extremities    Endometrial hyperplasia    H/O pleural empyema 09/24/2011   acute  in setting CAP /   s/p left  VATS with drainage empyema,  post op acute respiratory failure --- resolved   History of DVT of lower extremity 05/11/2021   dx left lower extremity popliteal vein,  completed 3 months eliquis  then changed to ASA 81mg    History of recurrent pneumonia    multiple pneumonias   Hypothyroidism     MDD (major depressive disorder)    OA (osteoarthritis)    bilat knee arthritis, had 3/3 Supartz injections Debbrah)   OCD (obsessive compulsive disorder)    OSA on CPAP 05/2012   folowed by dr shellia---- study in epic, severe OSA with AHI 118, desat nadir to 67%   (per pt mother pt uses nightly)   Pre-diabetes    Seizure disorder [facility]) 2013   neurologist--- dr rosemarie;   (10-28-2021  per pt mother no seizure since 2013)    long hospitalization, x 2 ; very mild; during hospitalization while coming off vent   Varicose veins of both lower extremities    followed by dr j. dew ---   with venous reflux,   10-25-2021  s/p laser procedure left leg GSV and SF   Wears glasses    Past Surgical History:  Procedure Laterality Date   ACHILLES TENDON REPAIR Right 01/2017   Dr. [place]   ACHILLES TENDON REPAIR Right 01/2017   ANKLE ARTHROSCOPY Right 11/11/2020   @[facility]  by dr janit;    debridement and stablilzation lateral collateral ligament   ENDOSCOPIC VEIN LASER TREATMENT Left 10/25/2021   by dr j. dew;   left leg GSV and SF for venous reflux   FLEXIBLE BRONCHOSCOPY  09/24/2011   Procedure: FLEXIBLE BRONCHOSCOPY;  Surgeon: Sudie VEAR Laine, MD;  Location: [facility] OR;  Service: Thoracic;  Laterality: N/A;   HYSTEROSCOPY WITH D & C N/A 09/27/2021   Procedure: DILATATION AND CURETTAGE /HYSTEROSCOPY;  Surgeon: Fredirick Glenys RAMAN, MD;  Location: [facility] OR;  Service: Gynecology;  Laterality: N/A;   INTRAUTERINE DEVICE (IUD) INSERTION N/A 11/07/2021   Procedure: INTRAUTERINE DEVICE (IUD) INSERTION;  Surgeon: Fredirick Glenys RAMAN, MD;  Location[facility];  Service: Gynecology;  Laterality: N/A;   LUMBAR DISC SURGERY  07/18/2004   @[facility]  by dr onetha;   L4--5 disectomy and left L3--4 nerve decompression   RETINAL LASER PROCEDURE Left 09/2022   TRACHEOSTOMY TUBE PLACEMENT  10/11/2011   Procedure: TRACHEOSTOMY;  Surgeon: Ida Loader, MD;  Location: [facility] OR;  Service: ENT;  Laterality: N/A;   VIDEO  ASSISTED THORACOSCOPY (VATS) /THOROCOTOMY/RADIOACTIVE SEED IMPLANT Left 09/24/2011   VATS, (Dr. Quin)   FAMILY HISTORY Family History  Problem Relation Age of Onset   Arthritis Mother    Hypertension Father    Diabetes Father    Cancer Neg Hx    Coronary artery disease Neg Hx    Stroke Neg Hx    SOCIAL HISTORY Social History   Tobacco Use   Smoking status: Never   Smokeless tobacco: Never  Vaping Use   Vaping status: Never Used  Substance Use Topics   Alcohol use: Never   Drug use: Never  OPHTHALMIC EXAM:  Base Eye Exam     Visual Acuity (Snellen - Linear)       Right Left   Dist cc 20/50 20/50   Dist ph cc NI NI    Correction: Glasses         Tonometry (Tonopen, 1:03 PM)       Right Left   Pressure 18 17         Pupils       Pupils Dark Light Shape React APD   Right PERRL 3 2 Round Brisk None   Left PERRL 3 2 Round Brisk None         Visual Fields (Counting fingers)       Left Right    Full Full         Extraocular Movement       Right Left    Full, Ortho Full, Ortho         Neuro/Psych     Oriented x3: Yes   Mood/Affect: Normal         Dilation     Both eyes: 1.0% Mydriacyl, 2.5% Phenylephrine  @ 1:04 PM           Slit Lamp and Fundus Exam     Slit Lamp Exam       Right Left   Lids/Lashes Dermatochalasis - upper lid Dermatochalasis - upper lid   Conjunctiva/Sclera White and quiet White and quiet   Cornea trace PEE trace Punctate epithelial erosions   Anterior Chamber Deep and clear, narrow temporal angle, no cell or flare Deep and clear, narrow temporal angle, no cell or flare   Iris Round and dilated Round and dilated   Lens 1-2+ Cortical cataract, pigment on anterior capsule, trace Posterior subcapsular cataract 1+ Cortical cataract   Anterior Vitreous mild syneresis, no cell or pigment mild syneresis, no cell or pigment         Fundus Exam       Right Left   Disc 2+mild Pallor, Sharp rim, +PPA/PPP  Pink and sharp, +PPA/PPP   C/D Ratio 0.3 0.3   Macula Flat, Blunted foveal reflex, scattered drusen, RPE mottling and clumping Flat, Blunted foveal reflex, mild ERM, RPE mottling, no heme or edema   Vessels attenuated, Tortuous, +pigment clumping along IT arcades attenuated, scattered patches of perivascular pigment clumping, mild tortuosity   Periphery Attached, peripapillary CR atrophy with pigment clumping extending along arcades, scattered pigmented CR scarring, +drusen, reticular degeneration, No RT/RD, ?shallow schisis IT periphery Attached, peripheral bone spicules ST quad, bullous schisis along IT arcades from 0400-0530 -- good laser surrounding, reticular degeneration           Refraction     Wearing Rx       Sphere Cylinder Axis   Right +1.50 +0.75 038   Left +2.50 +0.75 173            IMAGING AND PROCEDURES  Imaging and Procedures for 03/06/2023  OCT, Retina - OU - Both Eyes       Right Eye Quality was borderline. Central Foveal Thickness: 314. Progression has been stable. Findings include no IRF, no SRF, abnormal foveal contour, retinal drusen , epiretinal membrane, macular pucker (ERM with blunted foveal contour, mild pucker and trace cystic changes).   Left Eye Quality was good. Central Foveal Thickness: 346. Progression has been stable. Findings include no IRF, no SRF, abnormal foveal contour, myopic contour, epiretinal membrane, macular pucker (Mild ERM with blunted foveal contour, bullous schisis IT periphery  caught on widefield ).   Notes *Images captured and stored on drive  Diagnosis / Impression:  OD: ERM with blunted foveal contour, mild pucker and trace cystic changes OS: Mild ERM with blunted foveal contour, bullous schisis IT periphery caught on widefield   Clinical management:  See below  Abbreviations: NFP - Normal foveal profile. CME - cystoid macular edema. PED - pigment epithelial detachment. IRF - intraretinal fluid. SRF - subretinal fluid.  EZ - ellipsoid zone. ERM - epiretinal membrane. ORA - outer retinal atrophy. ORT - outer retinal tubulation. SRHM - subretinal hyper-reflective material. IRHM - intraretinal hyper-reflective material           ASSESSMENT/PLAN:    ICD-10-CM   1. Granulomatous chorioretinitis of both eyes  H30.893 OCT, Retina - OU - Both Eyes    2. Left retinoschisis  H33.102 OCT, Retina - OU - Both Eyes    3. Epiretinal membrane (ERM) of both eyes  H35.373 OCT, Retina - OU - Both Eyes    4. Cortical cataract of both eyes  H26.9      1. Chorioretinitis OU - pt of Dr. Marcey Dunnings at [facility], originally referred by [facility] in 2015 - pt has been stable for last several years without active inflammation on no medications and has been monitored annually - due to recent stability, pt and family transferred retina care closer to home - BCVA 20/50 OD, 20/50 OS -- subjectively and objectively stable (BC[facility] at [facility] 03/2020 was 20/64 OU) - exam shows pigmented CR scarring OU -- no signs of active inflammation - unable to check FA -- no vein access - no retinal or ophthalmic interventions indicated or recommended  - f/u 6-9 months, DFE, OCT  2. Retinoschisis OS - bullous retinoschisis IT periphery OU w/ posterior extension to IT arcades -- posterior progression since initial visit in March 2023  - no associated RT/RD OS  - ?shallow schisis IT periphery OD  - s/p laser retinopexy OS (08.01.24) -- good laser surrounding -- stable  - monitor  3. Epiretinal membrane, both eyes -- stable - BCVA 20/50 OD, 20/50 OS - asymptomatic, no metamorphopsia - no indication for surgery at this time - monitor for now - f/u 6-9 mos -- DFE/OCT  4. Cortical Cataract OU - The symptoms of cataract, surgical options, and treatments and risks were discussed with patient. - discussed diagnosis and progression - not yet visually significant - monitor for now   Ophthalmic Meds Ordered this visit:  No orders of the defined types were  placed in this encounter.    Return for f/u 6-9 months, chorioretinitis OU, DFE, OCT.  There are no Patient Instructions on file for this visit.   Explained the diagnoses, plan, and follow up with the patient and they expressed understanding.  Patient expressed understanding of the importance of proper follow up care.   This document serves as a record of services personally performed by Redell JUDITHANN Hans, MD, PhD. It was created on their behalf by Wanda GEANNIE Keens, COT an ophthalmic technician. The creation of this record is the provider's dictation and/or activities during the visit.    Electronically signed by:  Wanda GEANNIE Keens, COT  03/06/23 10:41 PM  This document serves as a record of services personally performed by Redell JUDITHANN Hans, MD, PhD. It was created on their behalf by Alan PARAS. Delores, OA an ophthalmic technician. The creation of this record is the provider's dictation and/or activities during the visit.    Electronically signed  by: Alan ALF Delores, OA 03/06/23 10:41 PM  Redell JUDITHANN Hans, M.D., Ph.D. Diseases & Surgery of the Retina and Vitreous [facility]  I have reviewed the above documentation for accuracy and completeness, and I agree with the above. Redell JUDITHANN Hans, M.D., Ph.D. 03/06/23 10:44 PM   Abbreviations: M myopia (nearsighted); A astigmatism; H hyperopia (farsighted); P presbyopia; Mrx spectacle prescription;  CTL contact lenses; OD right eye; OS left eye; OU both eyes  XT exotropia; ET esotropia; PEK punctate epithelial keratitis; PEE punctate epithelial erosions; DES dry eye syndrome; MGD meibomian gland dysfunction; ATs artificial tears; PFAT's preservative free artificial tears; NSC nuclear sclerotic cataract; PSC posterior subcapsular cataract; ERM epi-retinal membrane; PVD posterior vitreous detachment; RD retinal detachment; DM diabetes mellitus; DR diabetic retinopathy; NPDR non-proliferative diabetic retinopathy; PDR  proliferative diabetic retinopathy; CSME clinically significant macular edema; DME diabetic macular edema; dbh dot blot hemorrhages; CWS cotton wool spot; POAG primary open angle glaucoma; C/D cup-to-disc ratio; HVF humphrey visual field; GVF goldmann visual field; OCT optical coherence tomography; IOP intraocular pressure; BRVO Branch retinal vein occlusion; CRVO central retinal vein occlusion; CRAO central retinal artery occlusion; BRAO branch retinal artery occlusion; RT retinal tear; SB scleral buckle; PPV pars plana vitrectomy; VH Vitreous hemorrhage; PRP panretinal laser photocoagulation; IVK intravitreal kenalog ; VMT vitreomacular traction; MH Macular hole;  NVD neovascularization of the disc; NVE neovascularization elsewhere; AREDS age related eye disease study; ARMD age related macular degeneration; POAG primary open angle glaucoma; EBMD epithelial/anterior basement membrane dystrophy; ACIOL anterior chamber intraocular lens; IOL intraocular lens; PCIOL posterior chamber intraocular lens; Phaco/IOL phacoemulsification with intraocular lens placement; PRK photorefractive keratectomy; LASIK laser assisted in situ keratomileusis; HTN hypertension; DM diabetes mellitus; COPD chronic obstructive pulmonary disease

## 2023-03-04 DIAGNOSIS — G4733 Obstructive sleep apnea (adult) (pediatric): Secondary | ICD-10-CM | POA: Diagnosis not present

## 2023-03-05 NOTE — Progress Notes (Signed)
 [facility]  PATIENT: [name] DOB: [birth year]   REASON FOR VISIT: Follow-up for seizure disorder, [name] and memory loss HISTORY FROM: Patient and mom   Virtual Visit via Video Note  I connected with [name] on 03/05/23 at  9:45 AM EST by a video enabled telemedicine application and verified that I am speaking with the correct person using two identifiers.  Location: Patient: at home, accompanied by her mother Provider: in office   I discussed the limitations of evaluation and management by telemedicine and the availability of in person appointments. The patient expressed understanding and agreed to proceed.     HISTORY OF PRESENT ILLNESS:  Update 03/06/2023 JM: Patient returns for yearly follow-up visit accompanied by her mother via MyChart video visit.  Remains stable, compliant on Keppra  500 mg twice daily, no seizure activity.  Mother notes gradual decline with cognition, on memantine  10 mg twice daily.  No behavioral concerns.  No questions or concerns at this time.     History provided for reference purposes only Update 04/06/2022 JM: Patient returns for yearly follow-up accompanied by her mother.  No recent seizure activity, remains on Keppra  500 mg twice daily. Cognition stable since prior visit. Remains on Namenda  10 mg twice daily.  Prior concerns of underlying depression, increased zoloft  dosage but worsened behaviors therefore has since discontinued. Mother believes behaviors have improved since prior visit without intervention.  Routinely follows with PCP No concerns at this time  Update 03/31/2021 JM: Returns today for overdue seizure follow-up as well as underlying cognitive impairment with history of Down syndrome.  She is accompanied by her mother, [name].  Previously seen approx 9 months ago. Stable from seizure standpoint, no witnessed seizure activity, compliant on Keppra  500 mg twice daily, denies side effects. At prior  visit, concern of underlying depression contributing to cognitive decline, started sertraline  25mg  nightly initially with great improvement in regards to depression and stabilizing cognition but over past 2-3 months, has been having increased outbursts, tearfulness and cognitive decline.  Compliant on Namenda  10 mg twice daily, denies side effects.  No further concerns at this time.  Update 07/01/2020 JM: [name] returns for yearly follow-up with history of seizure disorder and cognitive impairment with history of Down syndrome.  She is accompanied by her mother, [name] Hibbs from a seizure standpoint without seizure activity and remains on Keppra  500 mg twice daily without side effects.   Cognition has been gradually declining and over the past year increased behavioral concerns including agitation, outbursts, sundowning, sleep-wake disturbance and slower movements.  She has remained on Namenda  10 mg twice daily tolerating without side effects.  Possible depression as mother will find her upset and crying occasionally.  She continues to live with her mother who is her full-time caregiver.  No new concerns at this time  Update 03/03/2019 JM: [name] is a 53 year old female who is being seen today via virtual visit for 1 year follow-up with history of seizure disorder, Down syndrome and memory loss.  She remains on Keppra  500 mg twice daily tolerating well without recent seizure activity.  Continues on Namenda  10 mg twice daily and memory has been stable.  She also has a history of OSA and continues use of CPAP with ongoing follow-up by Dr. Shellia.  She is currently prescribed multiple as needed pain medications such as tramadol , naproxen and meloxicam  due to ongoing knee pain unable to undergo knee replacement due to being overweight.  Continues  to attempt weight loss but due to limited mobility, this has been difficult.  No further concerns at this time.  UPDATE 12/30/2019CM [name], 53 year old  female returns for follow-up with history of seizure disorder Down syndrome and memory loss.  She also has a history of obstructive sleep apnea but her CPAP is followed by Dr. Shellia.  She remains on Keppra  500 twice daily without further seizure activity.  She is on Namenda  10 mg twice daily and her memory is stable according to the mother.  She is having problems with her left knee and needs to have knee replacement however she needs to lose weight prior to the surgery according to mom.  She no longer goes to adult daycare but helps her mom around the house.  No behavior issues..  She returns for reevaluation  UPDATE 12/26/2018CM [name], 53 year old female returns for follow-up with a history of Down syndrome and cognitive decline over several years.  She also has a seizure disorder which is currently well controlled on Keppra  500 mg twice daily.  She remains on Namenda  10 mg twice daily.  She gets little exercise and is overweight.  She continues to go to adult care 5 days a week.  She has a history of obstructive sleep apnea and is on CPAP with additional oxygen at night.  No recent behavior issues.  She has a history of gout.  She had recent ankle surgery for a torn tendon and is wearing a boot on the right foot.  No recent behavior issues.  Returns for reevaluation  UPDATE 12/15/2015 CM [name], 53 year old female returns for followup with her mother. She has a history of Downs syndrome and  cognitive decline over several years as well as seizure disorder. She is currently well controlled on Keppra  and she is on Namenda  without side effects to either drug. She is overweight, gets little exercise, lives at home. She goes to adult daycare 5 days a week . She has been having a lot of abdominal pain and diarrhea, she has appointment with GI in December.  She occasionally has behavior problems and was recently placed on Zoloft  however it made her irritability worse and she is slowly titrating off this. She  needs refills on Keppra  and Namenda  . She returns for reevaluation  Update 10/14/16PS : She returns for follow-up after last visit a year ago. She is a complaint by sister who provides most of the history. She continues to do well and has not had any breakthrough seizures now for a few years. Her memory and cognitive difficulties remain unchanged. She requires close supervision at home and does spend the day at the daycare center. She can feed dress herself and bathe herself. She is never left alone. There have been no issues with agitation, behavior, delusions or hallucinations or safety concerns. Her gait imbalance remained stable and she has had no falls.  HISTORY: [name] is a 73 year year old Caucasian lady with lifelong history of Down`s syndrome and cognitive decline in last several years from dementia who is referred back to see need for a new problem of seizures. She had recent prolonged hospital stay at [facility] pneumonia with empyema and ventilator dependent respiratory failure. She underwent video assisted thoracotomy for empyma drainage. She required prolonged antibiotics and was eventually required tracheostomy. She had 2 brief episodes of what sound like complex partial seizures one week apart in September 2013. Her mother who is present today  was eyewitness to both episodes. She was described as staring and unresponsive not following commands with a distant look on her face lasting a few minutes followed by a period of disorientation and tiredness. In the second episode she had some clonic activity in both her upper extremities. She was started on Keppra  500 twice a day falling and abnormal EEG on 11/04/2011 and with showed sharp waves in the left occipital parietal region. She was initially off her Namenda  and Prozac  in the hospital stay and had some behavioral agitation but settled down after the medicines were restarted. She has done well since  discharge and has not had any more seizures. She seems to be tolerating Keppra  quite well without any side effects. She is finished a course of antibiotics and home physical and occupation therapy. She is back living at home with mom and plans to start attending adult daycare soon. She apparently did not have any brain imaging study done.  03/11/12: Ms Billing returns for followup. She was last seen by Dr. Rosemarie 12/11/11: She had prolonged hospitilization in Sept 2013 and had seizure events. She was started on Keppra  at that time. No further seizure events. MRI of the brain 12/18/11 was normal. EEG was abnormal suggestive of mild focal irritability in right frontal and temporal regions but no definite epileptiform  activity is noted. ROS neg.    REVIEW OF SYSTEMS: Full 14 system review of systems performed and notable only for those listed, all others are neg:  Constitutional: neg  Cardiovascular: neg Ear/Nose/Throat: neg  Skin: neg Eyes: neg Respiratory: neg Gastroitestinal: neg Hematology/Lymphatic: neg  Endocrine: neg Musculoskeletal: BLE edema, pain Allergy/Immunology: neg Neurological: History of seizure disorder, Down syndrome, cognitive impairment with behaviors Psychiatric: depression, anxiety Sleep : neg   ALLERGIES: No Known Allergies  HOME MEDICATIONS: Outpatient Medications Prior to Visit  Medication Sig Dispense Refill   acetaminophen  (TYLENOL ) 650 MG CR tablet Take 2 tablets (1,300 mg total) by mouth 2 (two) times daily as needed for pain.     allopurinol  (ZYLOPRIM ) 100 MG tablet Take 1 tablet (100 mg total) by mouth at bedtime. 90 tablet 4   aspirin  EC 81 MG tablet Take 1 tablet (81 mg total) by mouth daily. Swallow whole.     clobetasol  cream (TEMOVATE ) 0.05 % APPLY TO SKIN RASH 2 TIMES DAILY AS NEEDED. MAX 2 WEEKS AT A TIME, AVOID FACE, UNDERARMS, GROIN. 45 g 0   Ibuprofen  200 MG CAPS Take 3 capsules (600 mg total) by mouth 2 (two) times daily as needed.      levETIRAcetam  (KEPPRA ) 500 MG tablet TAKE 1 TABLET BY MOUTH TWICE A DAY 180 tablet 3   levothyroxine  (SYNTHROID ) 125 MCG tablet Take 1 tablet (125 mcg total) by mouth daily before breakfast. 90 tablet 4   memantine  (NAMENDA ) 10 MG tablet TAKE 1 TABLET BY MOUTH TWICE A DAY 180 tablet 3   Multiple Vitamins-Minerals (WOMENS MULTI PO) Take 1 tablet by mouth at bedtime.     traMADol  (ULTRAM ) 50 MG tablet Take 1 tablet (50 mg total) by mouth 2 (two) times daily as needed for moderate pain (pain score 4-6). 30 tablet 0   No facility-administered medications prior to visit.    PAST MEDICAL HISTORY: 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 [facility])    neurologist--- dr rosemarie;   secondary to down syndrome (Trisomy 21-related),  nl CT head 2010, did not tolerate aricept   Down syndrome    Eczema    Edema of both lower extremities    Endometrial hyperplasia    H/O pleural empyema 09/24/2011   acute  in setting CAP /   s/p left  VATS with drainage empyema,  post op acute respiratory failure --- resolved   History of DVT of lower extremity 05/11/2021   dx left lower extremity popliteal vein,  completed 3 months eliquis  then changed to ASA 81mg    History of recurrent pneumonia    multiple pneumonias   Hypothyroidism    MDD (major depressive disorder)    OA (osteoarthritis)    bilat knee arthritis, had 3/3 Supartz injections [facility])   OCD (obsessive compulsive disorder)    OSA on CPAP 05/2012   folowed by dr shellia---- study in epic, severe OSA with AHI 118, desat nadir to 67%   (per pt mother pt uses nightly)   Pre-diabetes    Seizure disorder [facility]) 2013   neurologist--- dr rosemarie;   (10-28-2021  per pt mother no seizure since  2013)    long hospitalization, x 2 ; very mild; during hospitalization while coming off vent   Varicose veins of both lower extremities    followed by dr j. dew ---   with venous reflux,   10-25-2021  s/p laser procedure left leg GSV and SF   Wears glasses     PAST SURGICAL HISTORY: Past Surgical History:  Procedure Laterality Date   ACHILLES TENDON REPAIR Right 01/2017   Dr. Floyd [place]   ACHILLES TENDON REPAIR Right 01/2017   ANKLE ARTHROSCOPY Right 11/11/2020   @[facility]  by dr janit;    debridement and stablilzation lateral collateral ligament   ENDOSCOPIC VEIN LASER TREATMENT Left 10/25/2021   by dr j. dew;   left leg GSV and SF for venous reflux   FLEXIBLE BRONCHOSCOPY  09/24/2011   Procedure: FLEXIBLE BRONCHOSCOPY;  Surgeon: Sudie VEAR Laine, MD;  Location: [facility] OR;  Service: Thoracic;  Laterality: N/A;   HYSTEROSCOPY WITH D & C N/A 09/27/2021   Procedure: DILATATION AND CURETTAGE /HYSTEROSCOPY;  Surgeon: Fredirick Glenys RAMAN, MD;  Location: [facility] OR;  Service: Gynecology;  Laterality: N/A;   INTRAUTERINE DEVICE (IUD) INSERTION N/A 11/07/2021   Procedure: INTRAUTERINE DEVICE (IUD) INSERTION;  Surgeon: Fredirick Glenys RAMAN, MD;  Location: [facility];  Service: Gynecology;  Laterality: N/A;   LUMBAR DISC SURGERY  07/18/2004   @[facility]  by dr onetha;   L4--5 disectomy and left L3--4 nerve decompression   RETINAL LASER PROCEDURE Left 09/2022   TRACHEOSTOMY TUBE PLACEMENT  10/11/2011   Procedure: TRACHEOSTOMY;  Surgeon: Ida Loader, MD;  Location: [facility] OR;  Service: ENT;  Laterality: N/A;   VIDEO ASSISTED THORACOSCOPY (VATS) /THOROCOTOMY/RADIOACTIVE SEED IMPLANT Left 09/24/2011   VATS, (Dr. Quin)    FAMILY HISTORY: Family History  Problem Relation Age of Onset   Arthritis Mother    Hypertension Father    Diabetes Father    Cancer Neg Hx    Coronary artery disease Neg Hx    Stroke Neg Hx     SOCIAL HISTORY: Social History   Socioeconomic History   Marital status: Single     Spouse name: Not on file   Number of children: 0  Years of education: Not on file   Highest education level: Not on file  Occupational History   Not on file  Tobacco Use   Smoking status: Never   Smokeless tobacco: Never  Vaping Use   Vaping status: Never Used  Substance and Sexual Activity   Alcohol use: Never   Drug use: Never   Sexual activity: Never    Birth control/protection: None  Other Topics Concern   Not on file  Social History Narrative   Lives with mom and dad, dogs   Stays at daycare during the day   Activity: walking some   Diet: good water, fruits/vegetables daily   Social Drivers of Health   Financial Resource Strain: Low Risk  (10/23/2022)   Overall Financial Resource Strain (CARDIA)    Difficulty of Paying Living Expenses: Not hard at all  Food Insecurity: No Food Insecurity (10/23/2022)   Hunger Vital Sign    Worried About Running Out of Food in the Last Year: Never true    Ran Out of Food in the Last Year: Never true  Transportation Needs: No Transportation Needs (10/23/2022)   PRAPARE - Administrator, Civil Service (Medical): No    Lack of Transportation (Non-Medical): No  Physical Activity: Inactive (10/23/2022)   Exercise Vital Sign    Days of Exercise per Week: 0 days    Minutes of Exercise per Session: 0 min  Stress: Patient Unable To Answer (10/23/2022)   [employer] of Occupational Health - Occupational Stress Questionnaire    Feeling of Stress : Patient unable to answer  Social Connections: Socially Isolated (10/23/2022)   Social Connection and Isolation Panel [NHANES]    Frequency of Communication with Friends and Family: More than three times a week    Frequency of Social Gatherings with Friends and Family: More than three times a week    Attends Religious Services: Never    Database Administrator or Organizations: No    Attends Banker Meetings: Never    Marital Status: Never married  Intimate Partner  Violence: Patient Unable To Answer (10/23/2022)   Humiliation, Afraid, Rape, and Kick questionnaire    Fear of Current or Ex-Partner: Patient unable to answer    Emotionally Abused: Patient unable to answer    Physically Abused: Patient unable to answer    Sexually Abused: Patient unable to answer     PHYSICAL EXAM N/A d/t visit type     DIAGNOSTIC DATA (LABS, IMAGING, TESTING) - I reviewed patient records, labs, notes, testing and imaging myself where available.  Lab Results  Component Value Date   WBC 6.1 01/24/2023   HGB 15.0 01/24/2023   HCT 45.2 (H) 01/24/2023   MCV 105.1 (H) 01/24/2023   PLT 248 01/24/2023      Component Value Date/Time   NA 140 01/24/2023 1528   K 5.1 01/24/2023 1528   CL 102 01/24/2023 1528   CO2 29 01/24/2023 1528   GLUCOSE 112 (H) 01/24/2023 1528   BUN 13 01/24/2023 1528   CREATININE 1.08 (H) 01/24/2023 1528   CALCIUM  9.8 01/24/2023 1528   PROT 7.5 01/24/2023 1528   ALBUMIN  4.1 01/10/2022 0759   AST 26 01/24/2023 1528   ALT 35 (H) 01/24/2023 1528   ALKPHOS 50 01/10/2022 0759   BILITOT 0.6 01/24/2023 1528   GFRNONAA >60 10/06/2019 1655   GFRAA >60 10/06/2019 1655    Lab Results  Component Value Date   HGBA1C 6.1 (H) 01/24/2023   Lab  Results  Component Value Date   VITAMINB12 686 01/24/2023   Lab Results  Component Value Date   TSH 1.05 01/24/2023      ASSESSMENT AND PLAN 53 y.o. year old female  has a past medical history of Downs syndrome; Depression;  Seizure; and memory loss    1.  Seizure disorder -Stable without seizure activity -Continue Keppra  500 mg twice daily for seizure prophylaxis -refill up to date  2.  Dementia due to Down syndrome -Cognition mildly gradually declined over the past year -Continue Namenda  10 mg twice daily -refill up to date -prior intolerance to Aricept  -currently denies issues with significant behaviors     Follow-up in 1 year via MyChart video visit or call earlier if  needed   CC:  Rilla Baller, MD     I spent 15 minutes of face-to-face and non-face-to-face time with patient and mother via MyChart video visit.  This included previsit chart review, lab review, study review, order entry, electronic health record documentation, patient and mother education and discussion re: above diagnoses, treatment options and treatment plan and answered all the questions to patient and mother satisfaction  Harlene Bogaert, [facility]  [facility] [address] [place], [place] [address]  Phone [phone] Fax [phone] Note: This document was prepared with digital dictation and possible smart phrase technology. Any transcriptional errors that result from this process are unintentional.

## 2023-03-06 ENCOUNTER — Telehealth: Payer: Medicare PPO | Admitting: Adult Health

## 2023-03-06 ENCOUNTER — Encounter: Payer: Self-pay | Admitting: Adult Health

## 2023-03-06 ENCOUNTER — Telehealth: Payer: Self-pay

## 2023-03-06 ENCOUNTER — Ambulatory Visit (INDEPENDENT_AMBULATORY_CARE_PROVIDER_SITE_OTHER): Payer: Medicare PPO | Admitting: Ophthalmology

## 2023-03-06 ENCOUNTER — Encounter (INDEPENDENT_AMBULATORY_CARE_PROVIDER_SITE_OTHER): Payer: Self-pay | Admitting: Ophthalmology

## 2023-03-06 DIAGNOSIS — H35373 Puckering of macula, bilateral: Secondary | ICD-10-CM | POA: Diagnosis not present

## 2023-03-06 DIAGNOSIS — H33102 Unspecified retinoschisis, left eye: Secondary | ICD-10-CM | POA: Diagnosis not present

## 2023-03-06 DIAGNOSIS — H30893 Other chorioretinal inflammations, bilateral: Secondary | ICD-10-CM | POA: Diagnosis not present

## 2023-03-06 DIAGNOSIS — F0280 Dementia in other diseases classified elsewhere without behavioral disturbance: Secondary | ICD-10-CM | POA: Diagnosis not present

## 2023-03-06 DIAGNOSIS — R569 Unspecified convulsions: Secondary | ICD-10-CM

## 2023-03-06 DIAGNOSIS — H269 Unspecified cataract: Secondary | ICD-10-CM

## 2023-03-06 DIAGNOSIS — G40909 Epilepsy, unspecified, not intractable, without status epilepticus: Secondary | ICD-10-CM | POA: Diagnosis not present

## 2023-03-06 DIAGNOSIS — Q909 Down syndrome, unspecified: Secondary | ICD-10-CM | POA: Diagnosis not present

## 2023-03-06 MED ORDER — MEMANTINE HCL 10 MG PO TABS: 10.0000 mg | ORAL_TABLET | Freq: Two times a day (BID) | ORAL | 3 refills | Status: DC

## 2023-03-06 MED ORDER — LEVETIRACETAM 500 MG PO TABS: 500.0000 mg | ORAL_TABLET | Freq: Two times a day (BID) | ORAL | 3 refills | Status: DC

## 2023-03-06 NOTE — Patient Instructions (Signed)
 It was great to see you both again today!   We will continue current treatment plan with ongoing use of Keppra  500 mg twice daily for seizure prevention and Namenda  10 mg twice daily for cognition.  Refills have been sent to your pharmacy.   Please call with any questions or concerns prior to the next visit otherwise I will see you in 1 year!

## 2023-03-06 NOTE — Telephone Encounter (Signed)
 Called [name] per NP Ihor Austin, [name] is logging back on for her VV.

## 2023-03-15 DIAGNOSIS — H6123 Impacted cerumen, bilateral: Secondary | ICD-10-CM | POA: Diagnosis not present

## 2023-03-15 DIAGNOSIS — H900 Conductive hearing loss, bilateral: Secondary | ICD-10-CM | POA: Diagnosis not present

## 2023-03-28 ENCOUNTER — Encounter: Payer: Self-pay | Admitting: Podiatry

## 2023-03-28 ENCOUNTER — Ambulatory Visit (INDEPENDENT_AMBULATORY_CARE_PROVIDER_SITE_OTHER): Payer: Medicare PPO | Admitting: Podiatry

## 2023-03-28 DIAGNOSIS — M79675 Pain in left toe(s): Secondary | ICD-10-CM

## 2023-03-28 DIAGNOSIS — M79674 Pain in right toe(s): Secondary | ICD-10-CM | POA: Diagnosis not present

## 2023-03-28 DIAGNOSIS — I890 Lymphedema, not elsewhere classified: Secondary | ICD-10-CM | POA: Diagnosis not present

## 2023-03-28 DIAGNOSIS — B351 Tinea unguium: Secondary | ICD-10-CM

## 2023-03-28 NOTE — Progress Notes (Signed)
This patient returns to my office for at risk foot care.  This patient requires this care by a professional since this patient will be at risk due to having venous stasis and diabetes.  This patient is unable to cut nails herself since the patient cannot reach her nails.These nails are painful walking and wearing shoes.  This patient presents for at risk foot care today.  General Appearance  Alert, conversant and in no acute stress.  Vascular  Dorsalis pedis and posterior tibial  pulses are not palpable due to swelling. bilaterally.  Capillary return is within normal limits  bilaterally. Temperature is within normal limits  bilaterally. Venous stasis legs  B/l.  Neurologic  Senn-Weinstein monofilament wire test within normal limits  bilaterally. Muscle power within normal limits bilaterally.  Nails Thick disfigured discolored nails with subungual debris  from hallux to fifth toes bilaterally. No evidence of bacterial infection or drainage bilaterally.  Orthopedic  No limitations of motion  feet .  No crepitus or effusions noted.  No bony pathology or digital deformities noted.  Skin  normotropic skin with no porokeratosis noted bilaterally.  No signs of infections or ulcers noted.     Onychomycosis  Pain in right toes  Pain in left toes  Consent was obtained for treatment procedures.   Mechanical debridement of nails 1-5  bilaterally performed with a nail nipper.  Filed with dremel without incident.    Return office visit   3  months                   Told patient to return for periodic foot care and evaluation due to potential at risk complications.   Helane Gunther DPM

## 2023-03-31 DIAGNOSIS — F71 Moderate intellectual disabilities: Secondary | ICD-10-CM | POA: Diagnosis not present

## 2023-04-04 DIAGNOSIS — G4733 Obstructive sleep apnea (adult) (pediatric): Secondary | ICD-10-CM | POA: Diagnosis not present

## 2023-04-05 ENCOUNTER — Telehealth: Payer: Medicare PPO | Admitting: Adult Health

## 2023-04-10 ENCOUNTER — Ambulatory Visit: Payer: Medicare PPO | Admitting: Family Medicine

## 2023-04-11 ENCOUNTER — Ambulatory Visit: Payer: Medicare PPO | Admitting: Family Medicine

## 2023-04-11 ENCOUNTER — Encounter: Payer: Self-pay | Admitting: Family Medicine

## 2023-04-11 VITALS — BP 118/66 | HR 77 | Temp 97.8°F | Ht <= 58 in | Wt 249.5 lb

## 2023-04-11 DIAGNOSIS — J020 Streptococcal pharyngitis: Secondary | ICD-10-CM | POA: Insufficient documentation

## 2023-04-11 DIAGNOSIS — J029 Acute pharyngitis, unspecified: Secondary | ICD-10-CM | POA: Diagnosis not present

## 2023-04-11 DIAGNOSIS — J22 Unspecified acute lower respiratory infection: Secondary | ICD-10-CM | POA: Insufficient documentation

## 2023-04-11 DIAGNOSIS — J069 Acute upper respiratory infection, unspecified: Secondary | ICD-10-CM | POA: Diagnosis not present

## 2023-04-11 LAB — POCT RAPID STREP A (OFFICE): Rapid Strep A Screen: POSITIVE — AB

## 2023-04-11 MED ORDER — AMOXICILLIN 875 MG PO TABS: 875.0000 mg | ORAL_TABLET | Freq: Two times a day (BID) | ORAL | 0 refills | Status: AC

## 2023-04-11 NOTE — Assessment & Plan Note (Signed)
Anticipate 2 distinct processes going on. For viral URI, supportive measures reviewed. Fluids, rest, OTC remedies including cough syrup.  Update if not improving with treatment.

## 2023-04-11 NOTE — Assessment & Plan Note (Signed)
ST, R AC LAD, positive RST.  Treat with amoxicillin 875mg  BID 10d course.  Further supportive measures reviewed as per instructions.  Update if not improving with treatment.

## 2023-04-11 NOTE — Progress Notes (Signed)
Ph: [phone] Fax: [phone]   Patient ID: [name], female    DOB: [birth year], 53 y.o.   MRN: [id number]  This visit was conducted in person.  BP 118/66   Pulse 77   Temp 97.8 F (36.6 C) (Oral)   Ht 4\' 8"  (1.422 m)   Wt 249 lb 8 oz (113.2 kg)   SpO2 99%   BMI 55.94 kg/m    CC: cough  Subjective:   HPI: [name] is a 53 y.o. female presenting on 04/11/2023 for Cough (C/o cough and ST. Started 03/07/23. Pt accompanied by mom, [name]. )   4d h/o dry cough with ST, fatigue. Cough affecting sleep.   No fevers/chills, ear pain, chest pain body aches or wheezing.  Treating with mucinex DM with limited relief.  No ho asthma  + sick contacts at home - mom getting over pneumonia.      Relevant past medical, surgical, family and social history reviewed and updated as indicated. Interim medical history since our last visit reviewed. Allergies and medications reviewed and updated. Outpatient Medications Prior to Visit  Medication Sig Dispense Refill   allopurinol (ZYLOPRIM) 100 MG tablet Take 1 tablet (100 mg total) by mouth at bedtime. 90 tablet 4   aspirin EC 81 MG tablet Take 1 tablet (81 mg total) by mouth daily. Swallow whole.     clobetasol cream (TEMOVATE) 0.05 % APPLY TO SKIN RASH 2 TIMES DAILY AS NEEDED. MAX 2 WEEKS AT A TIME, AVOID FACE, UNDERARMS, GROIN. 45 g 0   Ibuprofen 200 MG CAPS Take 3 capsules (600 mg total) by mouth 2 (two) times daily as needed.     levETIRAcetam (KEPPRA) 500 MG tablet Take 1 tablet (500 mg total) by mouth 2 (two) times daily. 180 tablet 3   levothyroxine (SYNTHROID) 125 MCG tablet Take 1 tablet (125 mcg total) by mouth daily before breakfast. 90 tablet 4   memantine (NAMENDA) 10 MG tablet Take 1 tablet (10 mg total) by mouth 2 (two) times daily. 180 tablet 3   Multiple Vitamins-Minerals (WOMENS MULTI PO) Take 1 tablet by mouth at bedtime.     traMADol (ULTRAM) 50 MG tablet Take 1 tablet (50 mg total) by mouth 2 (two) times  daily as needed for moderate pain (pain score 4-6). 30 tablet 0   acetaminophen (TYLENOL) 650 MG CR tablet Take 2 tablets (1,300 mg total) by mouth 2 (two) times daily as needed for pain.     No facility-administered medications prior to visit.     Per HPI unless specifically indicated in ROS section below Review of Systems  Objective:  BP 118/66   Pulse 77   Temp 97.8 F (36.6 C) (Oral)   Ht 4\' 8"  (1.422 m)   Wt 249 lb 8 oz (113.2 kg)   SpO2 99%   BMI 55.94 kg/m   Wt Readings from Last 3 Encounters:  04/11/23 249 lb 8 oz (113.2 kg)  01/24/23 251 lb 2 oz (113.9 kg)  11/08/22 249 lb (112.9 kg)      Physical Exam Vitals and nursing note reviewed.  Constitutional:      Appearance: Normal appearance. She is not ill-appearing.     Comments: Tired appearing  HENT:     Head: Normocephalic and atraumatic.     Right Ear: Tympanic membrane, ear canal and external ear normal. There is no impacted cerumen.     Left Ear: Tympanic membrane, ear canal and external ear normal. There is no  impacted cerumen.     Nose:     Right Sinus: No maxillary sinus tenderness or frontal sinus tenderness.     Left Sinus: No maxillary sinus tenderness or frontal sinus tenderness.     Comments: Wearing mask    Mouth/Throat:     Mouth: Mucous membranes are moist.     Pharynx: Oropharynx is clear. No oropharyngeal exudate or posterior oropharyngeal erythema.     Comments: No significant oropharyngeal erythema/exudates Eyes:     Extraocular Movements: Extraocular movements intact.     Conjunctiva/sclera: Conjunctivae normal.     Pupils: Pupils are equal, round, and reactive to light.  Cardiovascular:     Rate and Rhythm: Normal rate and regular rhythm.     Pulses: Normal pulses.     Heart sounds: Normal heart sounds. No murmur heard. Pulmonary:     Effort: Pulmonary effort is normal. No respiratory distress.     Breath sounds: Normal breath sounds. No wheezing, rhonchi or rales.     Comments: Lungs  clear Musculoskeletal:     Cervical back: Normal range of motion and neck supple.  Lymphadenopathy:     Head:     Right side of head: No submental, submandibular, tonsillar, preauricular or posterior auricular adenopathy.     Left side of head: No submental, submandibular, tonsillar, preauricular or posterior auricular adenopathy.     Cervical: Cervical adenopathy (R AC LAD) present.     Right cervical: No superficial cervical adenopathy.    Left cervical: No superficial cervical adenopathy.     Upper Body:     Right upper body: No supraclavicular adenopathy.     Left upper body: No supraclavicular adenopathy.  Skin:    Findings: No rash.  Neurological:     Mental Status: She is alert.  Psychiatric:        Mood and Affect: Mood normal.        Behavior: Behavior normal.       Results for orders placed or performed in visit on 04/11/23  POCT rapid strep A   Collection Time: 04/11/23 11:36 AM  Result Value Ref Range   Rapid Strep A Screen Positive (A) Negative    Assessment & Plan:   Problem List Items Addressed This Visit     Viral URI with cough   Anticipate 2 distinct processes going on. For viral URI, supportive measures reviewed. Fluids, rest, OTC remedies including cough syrup.  Update if not improving with treatment.       Strep throat - Primary   ST, R AC LAD, positive RST.  Treat with amoxicillin 875mg  BID 10d course.  Further supportive measures reviewed as per instructions.  Update if not improving with treatment.       Other Visit Diagnoses       Sore throat       Relevant Orders   POCT rapid strep A (Completed)        Meds ordered this encounter  Medications   amoxicillin (AMOXIL) 875 MG tablet    Sig: Take 1 tablet (875 mg total) by mouth 2 (two) times daily for 10 days.    Dispense:  20 tablet    Refill:  0    Orders Placed This Encounter  Procedures   POCT rapid strep A    Patient Instructions  You have acute strep throat and  possible concomitant viral respiratory infection.  Take amoxicillin 875mg  twice daily for 10 days.   Push fluids and plenty of rest.  May  use ibuprofen for throat inflammation. May continue mucinex DM during the day and take OTC cough syrup like robitussin or delsym at night time Suck on cold things like popsicles or warm things like herbal teas (whichever soothes the throat better). Return if fever >101.5, worsening pain, or trouble opening/closing mouth, or hoarse voice. Good to see you today, call clinic with questions.   Follow up plan: Return if symptoms worsen or fail to improve.  Eustaquio Boyden, MD

## 2023-04-11 NOTE — Patient Instructions (Signed)
You have acute strep throat and possible concomitant viral respiratory infection.  Take amoxicillin 875mg  twice daily for 10 days.   Push fluids and plenty of rest.  May use ibuprofen for throat inflammation. May continue mucinex DM during the day and take OTC cough syrup like robitussin or delsym at night time Suck on cold things like popsicles or warm things like herbal teas (whichever soothes the throat better). Return if fever >101.5, worsening pain, or trouble opening/closing mouth, or hoarse voice. Good to see you today, call clinic with questions.

## 2023-04-13 ENCOUNTER — Ambulatory Visit: Payer: Self-pay | Admitting: Family Medicine

## 2023-04-13 NOTE — Telephone Encounter (Signed)
Chief Complaint: Strep throat on abx and worsening Symptoms: Wheezing, sore throat, difficulty breathing, cough Frequency: Worsening Pertinent Negatives: Patient denies fever Disposition: [x] ED /[] Urgent Care (no appt availability in office) / [] Appointment(In office/virtual)/ []  [facility]/ [] Home Care/ [] Refused Recommended Disposition /[] [facility]/ []  Follow-up with PCP Additional Notes: Spoke with pt's mom, [name]. Pt was seen 2/12 and diagnosed with strep throat. Pt is taking amoxicillin and Mucinex. Pt mom states she is getting worse instead of better. Pt mom advised to take pt to ED. Pt mom states understanding and is getting a friend to drive them there now as pt mom cannot drive due to recent surgery. Pt mom verbalized understanding and agrees to plan.    Copied from CRM [id number]. Topic: Clinical - Red Word Triage >> Apr 13, 2023 12:09 PM [name] wrote: Red Word that prompted transfer to Nurse Triage: Wheezing, cough, coughs every breath. Recently seen getting worse. No fever. Had previous lung surgery for pneumonia Reason for Disposition  Patient sounds very sick or weak to the triager  Answer Assessment - Initial Assessment Questions 1. SYMPTOM: "What's the main symptom you're concerned about?" (e.g., fever, difficulty swallowing, sore throat)     Cough 2. ANTIBIOTIC: "What antibiotic are you taking?" "How many times a day?"     Amoxicillin bid 3. ONSET: "When was the antibiotic started?"     2/12 5. FEVER: "Do you have a fever?" If Yes, ask: "What is your temperature, how was it measured, and when did it start?"     Denies 7. BETTER-SAME-WORSE: "Are you getting better, staying the same, or getting worse compared to the day you started the antibiotics?"     Worse  Protocols used: Strep Throat Infection on Antibiotic Follow-up Call-A-[facility]

## 2023-04-16 MED ORDER — AZITHROMYCIN 250 MG PO TABS: ORAL_TABLET | ORAL | 0 refills | Status: AC

## 2023-04-16 NOTE — Telephone Encounter (Signed)
Spoke with mom [name] - they did not take [name] to ER on Friday.  She continues coughing a lot, today sounds like it's breaking up.  She continues OTC [facility] cough DM with dextromethorphan as well as guaifenesin.  No fevers/chills, rattly cough persists.  Will add zpack to current amoxicillin  course.

## 2023-04-16 NOTE — Addendum Note (Signed)
Addended by: Eustaquio Boyden on: 04/16/2023 05:22 PM   Modules accepted: Orders

## 2023-04-21 DIAGNOSIS — G4733 Obstructive sleep apnea (adult) (pediatric): Secondary | ICD-10-CM | POA: Diagnosis not present

## 2023-04-23 ENCOUNTER — Ambulatory Visit (INDEPENDENT_AMBULATORY_CARE_PROVIDER_SITE_OTHER): Payer: Medicare PPO | Admitting: Family Medicine

## 2023-04-23 ENCOUNTER — Ambulatory Visit (INDEPENDENT_AMBULATORY_CARE_PROVIDER_SITE_OTHER)
Admission: RE | Admit: 2023-04-23 | Discharge: 2023-04-23 | Disposition: A | Discharge status: Home / Self Care | Payer: Medicare PPO | Source: Ambulatory Visit | Attending: Family Medicine | Admitting: Family Medicine

## 2023-04-23 ENCOUNTER — Encounter: Payer: Self-pay | Admitting: Family Medicine

## 2023-04-23 VITALS — BP 126/68 | HR 60 | Temp 98.0°F | Ht <= 58 in | Wt 250.5 lb

## 2023-04-23 DIAGNOSIS — J020 Streptococcal pharyngitis: Secondary | ICD-10-CM | POA: Diagnosis not present

## 2023-04-23 DIAGNOSIS — R051 Acute cough: Secondary | ICD-10-CM | POA: Diagnosis not present

## 2023-04-23 DIAGNOSIS — R918 Other nonspecific abnormal finding of lung field: Secondary | ICD-10-CM | POA: Diagnosis not present

## 2023-04-23 DIAGNOSIS — R059 Cough, unspecified: Secondary | ICD-10-CM | POA: Diagnosis not present

## 2023-04-23 DIAGNOSIS — J22 Unspecified acute lower respiratory infection: Secondary | ICD-10-CM | POA: Diagnosis not present

## 2023-04-23 MED ORDER — AMOXICILLIN-POT CLAVULANATE 875-125 MG PO TABS: 1.0000 | ORAL_TABLET | Freq: Two times a day (BID) | ORAL | 0 refills | Status: AC

## 2023-04-23 NOTE — Assessment & Plan Note (Signed)
 This has resolved after amoxicillin treatment.

## 2023-04-23 NOTE — Progress Notes (Addendum)
 Ph: [phone] Fax: [phone]   Patient ID: [name], female    DOB: [birth year], 53 y.o.   MRN: [id number]  This visit was conducted in person.  BP 126/68   Pulse 60   Temp 98 F (36.7 C) (Oral)   Ht 4\' 8"  (1.422 m)   Wt 250 lb 8 oz (113.6 kg)   SpO2 96% Comment: RA  BMI 56.16 kg/m    CC: cough, congestion, recent strep throat Subjective:   HPI: [name] is a 53 y.o. female presenting on 04/23/2023 for Cough (C/o ongoing dry cough- now has crackling sound in chest. ST has resolved after abx tx. Pt accompanied by mom, [name].  )   See prior note for details. Seen here 12d ago with strep throat treated with amoxicillin with resolution of strep throat symptoms however cough has persisted and worsened - additionally started on azithromycin to cover pulmonary infection.   Cough started ~04/07/2023.  Persistent harsh cough with dyspnea, nothing coming up.  Continues Mucinex DM as well as OTC cough syrup.  They are also using cool mist humidifier   No fevers/chills,   Mother [name] had pneumonia earlier in the month.   H/o sepsis after pneumonia with empyema s/p VATS 2013.     Relevant past medical, surgical, family and social history reviewed and updated as indicated. Interim medical history since our last visit reviewed. Allergies and medications reviewed and updated. Outpatient Medications Prior to Visit  Medication Sig Dispense Refill   allopurinol (ZYLOPRIM) 100 MG tablet Take 1 tablet (100 mg total) by mouth at bedtime. 90 tablet 4   aspirin EC 81 MG tablet Take 1 tablet (81 mg total) by mouth daily. Swallow whole.     clobetasol cream (TEMOVATE) 0.05 % APPLY TO SKIN RASH 2 TIMES DAILY AS NEEDED. MAX 2 WEEKS AT A TIME, AVOID FACE, UNDERARMS, GROIN. 45 g 0   Ibuprofen 200 MG CAPS Take 3 capsules (600 mg total) by mouth 2 (two) times daily as needed.     levETIRAcetam (KEPPRA) 500 MG tablet Take 1 tablet (500 mg total) by mouth 2 (two) times daily. 180  tablet 3   levothyroxine (SYNTHROID) 125 MCG tablet Take 1 tablet (125 mcg total) by mouth daily before breakfast. 90 tablet 4   memantine (NAMENDA) 10 MG tablet Take 1 tablet (10 mg total) by mouth 2 (two) times daily. 180 tablet 3   Multiple Vitamins-Minerals (WOMENS MULTI PO) Take 1 tablet by mouth at bedtime.     traMADol (ULTRAM) 50 MG tablet Take 1 tablet (50 mg total) by mouth 2 (two) times daily as needed for moderate pain (pain score 4-6). 30 tablet 0   No facility-administered medications prior to visit.     Per HPI unless specifically indicated in ROS section below Review of Systems  Objective:  BP 126/68   Pulse 60   Temp 98 F (36.7 C) (Oral)   Ht 4\' 8"  (1.422 m)   Wt 250 lb 8 oz (113.6 kg)   SpO2 96% Comment: RA  BMI 56.16 kg/m   Wt Readings from Last 3 Encounters:  04/23/23 250 lb 8 oz (113.6 kg)  04/11/23 249 lb 8 oz (113.2 kg)  01/24/23 251 lb 2 oz (113.9 kg)      Physical Exam Vitals and nursing note reviewed.  Constitutional:      Appearance: Normal appearance. She is not ill-appearing.  HENT:     Head: Normocephalic and atraumatic.  Right Ear: Tympanic membrane, ear canal and external ear normal. There is no impacted cerumen.     Left Ear: Tympanic membrane, ear canal and external ear normal. There is no impacted cerumen.     Nose:     Right Sinus: No maxillary sinus tenderness or frontal sinus tenderness.     Left Sinus: No maxillary sinus tenderness or frontal sinus tenderness.     Comments: wearing mask    Mouth/Throat:     Mouth: Mucous membranes are moist.     Pharynx: Oropharynx is clear. No oropharyngeal exudate or posterior oropharyngeal erythema.  Eyes:     Extraocular Movements: Extraocular movements intact.     Conjunctiva/sclera: Conjunctivae normal.     Pupils: Pupils are equal, round, and reactive to light.  Cardiovascular:     Rate and Rhythm: Normal rate and regular rhythm.     Pulses: Normal pulses.     Heart sounds: Normal  heart sounds. No murmur heard. Pulmonary:     Effort: Pulmonary effort is normal. No respiratory distress.     Breath sounds: Normal breath sounds. No wheezing, rhonchi or rales.     Comments: Significant upper airway transmission/wheezing Musculoskeletal:     Cervical back: Normal range of motion and neck supple.  Lymphadenopathy:     Head:     Right side of head: No submental, submandibular, tonsillar, preauricular or posterior auricular adenopathy.     Left side of head: No submental, submandibular, tonsillar, preauricular or posterior auricular adenopathy.     Cervical: No cervical adenopathy.     Right cervical: No superficial cervical adenopathy.    Left cervical: No superficial cervical adenopathy.     Upper Body:     Right upper body: No supraclavicular adenopathy.     Left upper body: No supraclavicular adenopathy.  Skin:    Findings: No rash.  Neurological:     Mental Status: She is alert.  Psychiatric:        Mood and Affect: Mood normal.        Behavior: Behavior normal.       Results for orders placed or performed in visit on 04/11/23  POCT rapid strep A   Collection Time: 04/11/23 11:36 AM  Result Value Ref Range   Rapid Strep A Screen Positive (A) Negative    Assessment & Plan:   Problem List Items Addressed This Visit     Acute respiratory infection - Primary   Initially thought viral URI with cough, but this persists despite amoxicillin then azithromycin course. Cough ongoing for 2+ wks now.  Lungs largely clear however O2 sats are lower at 92% although it does improve to 95%.  Check CXR today - some perihilar haziness more on right.  Will Rx augmentin to cover CAP. Update if not improving with this treatment.       Strep throat   This has resolved after amoxicillin treatment.        Meds ordered this encounter  Medications   amoxicillin-clavulanate (AUGMENTIN) 875-125 MG tablet    Sig: Take 1 tablet by mouth 2 (two) times daily for 10 days.     Dispense:  20 tablet    Refill:  0    Orders Placed This Encounter  Procedures   DG Chest 2 View    Standing Status:   Future    Number of Occurrences:   1    Expiration Date:   04/22/2024    Reason for Exam (SYMPTOM  OR DIAGNOSIS REQUIRED):  cough, hypoxia    Is patient pregnant?:   No    Preferred imaging location?:   [facility]    Patient Instructions  Chest xray today  Continue mucinex DM  Take augmentin (amoxicillin/clavulanate) antibiotic 10 day course sent to pharmacy.  Let us know if not improving with this treatment.   Follow up plan: No follow-ups on file.  Eustaquio Boyden, MD

## 2023-04-23 NOTE — Assessment & Plan Note (Addendum)
 Initially thought viral URI with cough, but this persists despite amoxicillin then azithromycin course. Cough ongoing for 2+ wks now.  Lungs largely clear however O2 sats are lower at 92% although it does improve to 95%.  Check CXR today - some perihilar haziness more on right.  Will Rx augmentin to cover CAP. Update if not improving with this treatment.

## 2023-04-23 NOTE — Patient Instructions (Addendum)
 Chest xray today  Continue mucinex DM  Take augmentin (amoxicillin/clavulanate) antibiotic 10 day course sent to pharmacy.  Let us know if not improving with this treatment.

## 2023-05-02 DIAGNOSIS — G4733 Obstructive sleep apnea (adult) (pediatric): Secondary | ICD-10-CM | POA: Diagnosis not present

## 2023-05-07 ENCOUNTER — Encounter: Payer: Self-pay | Admitting: Family Medicine

## 2023-05-08 ENCOUNTER — Telehealth: Payer: Self-pay

## 2023-05-08 ENCOUNTER — Ambulatory Visit (INDEPENDENT_AMBULATORY_CARE_PROVIDER_SITE_OTHER): Payer: Medicare PPO | Admitting: Vascular Surgery

## 2023-05-08 NOTE — Telephone Encounter (Signed)
 Pt's mom call back with cough update (see 05/08/23 phn note).

## 2023-05-08 NOTE — Telephone Encounter (Signed)
 Copied from CRM [id number]. Topic: General - Other >> May 08, 2023 11:16 AM Gurney Maxin H wrote: Reason for CRM: Patients mom returning call to clinic in response to provider message, mom states patient is getting better but has a cough that seems like it's trying to come back.  [name] [phone]

## 2023-05-08 NOTE — Telephone Encounter (Addendum)
 See 05/07/23 pt message.

## 2023-05-09 MED ORDER — GUAIFENESIN-CODEINE 100-10 MG/5ML PO SOLN: 5.0000 mL | Freq: Two times a day (BID) | ORAL | 0 refills | Status: DC | PRN

## 2023-05-09 NOTE — Telephone Encounter (Signed)
 Spoke with mom [name]  Seen 04/23/2023.  DG Chest 2 View CLINICAL DATA:  53 year old female with a history of cough  EXAM: CHEST - 2 VIEW  COMPARISON:  10/25/2020  FINDINGS: Cardiomediastinal silhouette unchanged in size and contour. No evidence of central vascular congestion. No interlobular septal thickening.  Bronchial wall thickening and vague reticulonodular opacities in the lower lungs  No pneumothorax or pleural effusion.  No confluent airspace disease.  No acute displaced fracture. Degenerative changes of the spine.  IMPRESSION: Mild bronchial wall thickening and reticulonodular opacities, may reflect bronchitis or other atypical infection. Negative for lobar pneumonia.  Electronically Signed   By: Gilmer Mor D.O.   On: 04/30/2023 15:06  Treated with augmentin 10d + zpack course.   Finished abx 1+ wk ago  Has lingering cough, dry sounding. No fevers/chills, wheezing or dyspnea.  Continuous night time cough with coughing fits.   Suspect residual post-infectious inflammatory cough   On mucinex DM.  Will Rx codeine cough syrup over next few nights and update with effect.

## 2023-05-09 NOTE — Addendum Note (Signed)
 Addended by: Eustaquio Boyden on: 05/09/2023 04:01 PM   Modules accepted: Orders

## 2023-05-18 ENCOUNTER — Telehealth (INDEPENDENT_AMBULATORY_CARE_PROVIDER_SITE_OTHER): Payer: Self-pay

## 2023-05-18 ENCOUNTER — Other Ambulatory Visit (INDEPENDENT_AMBULATORY_CARE_PROVIDER_SITE_OTHER): Payer: Self-pay | Admitting: Nurse Practitioner

## 2023-05-18 MED ORDER — DOXYCYCLINE HYCLATE 100 MG PO CAPS
100.0000 mg | ORAL_CAPSULE | Freq: Two times a day (BID) | ORAL | 0 refills | Status: DC
Start: 2023-05-18 — End: 2023-06-18

## 2023-05-18 NOTE — Telephone Encounter (Signed)
 Mother called stating that [name] legs are fire red and are swollen. She has been using the pumps, but she couldn't find her compressions until this morning and she couldn't get them on. So, she have been elevating today.   Please advise

## 2023-05-18 NOTE — Telephone Encounter (Signed)
 I sent in some doxycycline for the patient, if they continue to be red and swollen over the weekend, she should call us to be seen sooner

## 2023-05-18 NOTE — Telephone Encounter (Signed)
 Mother is going to pick up meds now.

## 2023-06-02 DIAGNOSIS — G4733 Obstructive sleep apnea (adult) (pediatric): Secondary | ICD-10-CM | POA: Diagnosis not present

## 2023-06-05 ENCOUNTER — Encounter (INDEPENDENT_AMBULATORY_CARE_PROVIDER_SITE_OTHER): Payer: Self-pay | Admitting: Vascular Surgery

## 2023-06-05 ENCOUNTER — Ambulatory Visit (INDEPENDENT_AMBULATORY_CARE_PROVIDER_SITE_OTHER): Admitting: Vascular Surgery

## 2023-06-05 VITALS — BP 122/82 | HR 65 | Resp 18 | Wt 251.6 lb

## 2023-06-05 DIAGNOSIS — I890 Lymphedema, not elsewhere classified: Secondary | ICD-10-CM | POA: Diagnosis not present

## 2023-06-05 DIAGNOSIS — Z6841 Body Mass Index (BMI) 40.0 and over, adult: Secondary | ICD-10-CM | POA: Diagnosis not present

## 2023-06-05 DIAGNOSIS — Q909 Down syndrome, unspecified: Secondary | ICD-10-CM

## 2023-06-05 DIAGNOSIS — I83893 Varicose veins of bilateral lower extremities with other complications: Secondary | ICD-10-CM | POA: Diagnosis not present

## 2023-06-05 DIAGNOSIS — R7303 Prediabetes: Secondary | ICD-10-CM

## 2023-06-05 NOTE — Assessment & Plan Note (Signed)
 Symptom control is not ideal, but is probably about as good as we can do at this point.  Weight loss and exercise would be of some benefit.  Continue use of compression socks and her lymphedema pump are likely the hallmarks of treatment going forward.  Follow-up in 6 months.

## 2023-06-05 NOTE — Progress Notes (Signed)
 MRN : [id number]  [name] is a 53 y.o. (1971-02-09) female who presents with chief complaint of  Chief Complaint  Patient presents with   Follow-up    6 month follow up  .  History of Present Illness: Patient returns today in follow up of lymphedema and leg swelling.  She has been diligently wearing her compression socks with the help of her parents.  She tries to elevate her legs.  She is not particularly active.  The lymphedema pump has been used regularly but she continues to have significant swelling more prominent in the left lower extremity than the right.  She has previously undergone laser ablation on the left lower extremity in the past with minimal improvement in the swelling.  She clearly has fairly severe lymphedema.  This does not appear to hurt her.  She is not the best historian, but she says most of her pain is in her hips and upper leg and not in her lower leg.  No ulceration or infection.  No fevers or chills.  Current Outpatient Medications  Medication Sig Dispense Refill   allopurinol (ZYLOPRIM) 100 MG tablet Take 1 tablet (100 mg total) by mouth at bedtime. 90 tablet 4   aspirin EC 81 MG tablet Take 1 tablet (81 mg total) by mouth daily. Swallow whole.     clobetasol cream (TEMOVATE) 0.05 % APPLY TO SKIN RASH 2 TIMES DAILY AS NEEDED. MAX 2 WEEKS AT A TIME, AVOID FACE, UNDERARMS, GROIN. 45 g 0   doxycycline (VIBRAMYCIN) 100 MG capsule Take 1 capsule (100 mg total) by mouth 2 (two) times daily. 20 capsule 0   guaiFENesin-codeine 100-10 MG/5ML syrup Take 5 mLs by mouth 2 (two) times daily as needed for cough (sedation precautions). 118 mL 0   Ibuprofen 200 MG CAPS Take 3 capsules (600 mg total) by mouth 2 (two) times daily as needed.     levETIRAcetam (KEPPRA) 500 MG tablet Take 1 tablet (500 mg total) by mouth 2 (two) times daily. 180 tablet 3   levothyroxine (SYNTHROID) 125 MCG tablet Take 1 tablet (125 mcg total) by mouth daily before breakfast. 90 tablet 4    memantine (NAMENDA) 10 MG tablet Take 1 tablet (10 mg total) by mouth 2 (two) times daily. 180 tablet 3   Multiple Vitamins-Minerals (WOMENS MULTI PO) Take 1 tablet by mouth at bedtime.     traMADol (ULTRAM) 50 MG tablet Take 1 tablet (50 mg total) by mouth 2 (two) times daily as needed for moderate pain (pain score 4-6). 30 tablet 0   No current facility-administered medications for this visit.    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 [facility])    neurologist--- dr [name];   secondary to down syndrome (Trisomy 21-related),  nl CT head 2010, did not tolerate aricept   Down syndrome    Eczema    Edema of both lower extremities    Endometrial hyperplasia    H/O pleural empyema 09/24/2011   acute  in setting CAP /   s/p left  VATS with drainage empyema,  post op acute respiratory failure --- resolved   History of DVT of lower extremity 05/11/2021  dx left lower extremity popliteal vein,  completed 3 months eliquis then changed to ASA 81mg    History of recurrent pneumonia    multiple pneumonias   Hypothyroidism    MDD (major depressive disorder)    OA (osteoarthritis)    bilat knee arthritis, had 3/3 Supartz injections [name])   OCD (obsessive compulsive disorder)    OSA on CPAP 05/2012   folowed by dr Craige Cotta---- study in epic, severe OSA with AHI 118, desat nadir to 67%   (per pt mother pt uses nightly)   Pre-diabetes    Seizure disorder [facility]) 2013   neurologist--- dr [name];   (10-28-2021  per pt mother no seizure since 2013)    long hospitalization, x 2 ; "very mild"; during hospitalization "while coming off vent"   Varicose veins of both lower extremities    followed by dr j. Leshia Kope ---   with venous reflux,    10-25-2021  s/p laser procedure left leg GSV and SF   Wears glasses     Past Surgical History:  Procedure Laterality Date   ACHILLES TENDON REPAIR Right 01/2017   [facility]   ACHILLES TENDON REPAIR Right 01/2017   ANKLE ARTHROSCOPY Right 11/11/2020   @[facility]  by dr Logan Bores;    debridement and stablilzation lateral collateral ligament   ENDOSCOPIC VEIN LASER TREATMENT Left 10/25/2021   by dr j. Yanna Leaks;   left leg GSV and SF for venous reflux   FLEXIBLE BRONCHOSCOPY  09/24/2011   Procedure: FLEXIBLE BRONCHOSCOPY;  Surgeon: Purcell Nails, MD;  [facility] OR;  Service: Thoracic;  Laterality: N/A;   HYSTEROSCOPY WITH D & C N/A 09/27/2021   Procedure: DILATATION AND CURETTAGE /HYSTEROSCOPY;  Surgeon: Reva Bores, MD;  Location: [facility] OR;  Service: Gynecology;  Laterality: N/A;   INTRAUTERINE DEVICE (IUD) INSERTION N/A 11/07/2021   Procedure: INTRAUTERINE DEVICE (IUD) INSERTION;  Surgeon: Reva Bores, MD;  Location[facility];  Service: Gynecology;  Laterality: N/A;   LUMBAR DISC SURGERY  07/18/2004   @[facility]  by dr Wynetta Emery;   L4--5 disectomy and left L3--4 nerve decompression   RETINAL LASER PROCEDURE Left 09/2022   TRACHEOSTOMY TUBE PLACEMENT  10/11/2011   Procedure: TRACHEOSTOMY;  Surgeon: Serena Colonel, MD;  Location: [facility] OR;  Service: ENT;  Laterality: N/A;   VIDEO ASSISTED THORACOSCOPY (VATS) /THOROCOTOMY/RADIOACTIVE SEED IMPLANT Left 09/24/2011   VATS, (Dr. Barry Dienes)     Social History   Tobacco Use   Smoking status: Never   Smokeless tobacco: Never  Vaping Use   Vaping status: Never Used  Substance Use Topics   Alcohol use: Never   Drug use: Never      Family History  Problem Relation Age of Onset   Arthritis Mother    Hypertension Father    Diabetes Father    Cancer Neg Hx    Coronary artery disease Neg Hx    Stroke Neg Hx      No Known Allergies    REVIEW OF SYSTEMS (Negative unless checked)   Constitutional: [] Weight loss  [] Fever   [] Chills Cardiac: [] Chest pain   [] Chest pressure   [] Palpitations   [] Shortness of breath when laying flat   [] Shortness of breath at rest   [] Shortness of breath with exertion. Vascular:  [x] Pain in legs with walking   [x] Pain in legs at rest   [] Pain in legs when laying flat   [] Claudication   [] Pain in feet when walking  [] Pain in feet at rest  []   Pain in feet when laying flat   [] History of DVT   [] Phlebitis   [x] Swelling in legs   [] Varicose veins   [] Non-healing ulcers Pulmonary:   [] Uses home oxygen   [] Productive cough   [] Hemoptysis   [] Wheeze  [] COPD   [] Asthma Neurologic:  [] Dizziness  [] Blackouts   [x] Seizures   [] History of stroke   [] History of TIA  [] Aphasia   [] Temporary blindness   [] Dysphagia   [] Weakness or numbness in arms   [] Weakness or numbness in legs Musculoskeletal:  [] Arthritis   [] Joint swelling   [] Joint pain   [] Low back pain Hematologic:  [] Easy bruising  [] Easy bleeding   [] Hypercoagulable state   [] Anemic  [] Hepatitis Gastrointestinal:  [] Blood in stool   [] Vomiting blood  [x] Gastroesophageal reflux/heartburn   [] Abdominal pain Genitourinary:  [] Chronic kidney disease   [] Difficult urination  [] Frequent urination  [] Burning with urination   [] Hematuria Skin:  [] Rashes   [] Ulcers   [] Wounds Psychological:  [] History of anxiety   [x]  History of major depression.   Physical Examination  BP 122/82   Pulse 65   Resp 18   Wt 251 lb 9.6 oz (114.1 kg)   BMI 56.41 kg/m  Gen:  WD/WN, NAD. Typical Downs appearance. Obese.  Head: [place]/AT, No temporalis wasting. Ear/Nose/Throat: Hearing grossly intact, nares w/o erythema or drainage Eyes: Conjunctiva clear. Sclera non-icteric Neck: Supple.  Trachea midline Pulmonary:  Good air movement, no use of accessory muscles.  Cardiac: RRR, no JVD Vascular:  Vessel Right Left  Radial Palpable Palpable                          PT Not Palpable Not Palpable  DP Trace Palpable Trace Palpable   Musculoskeletal: M/S 5/5  throughout.  No deformity or atrophy. 2+ RLE edema, 3+ LLE edema. Neurologic: Sensation grossly intact in extremities.  Symmetrical.  Speech is fluent.  Psychiatric: Judgment and insight are fair. Dermatologic: No rashes or ulcers noted.  No cellulitis or open wounds.      Labs Recent Results (from the past 2160 hours)  POCT rapid strep A     Status: Abnormal   Collection Time: 04/11/23 11:36 AM  Result Value Ref Range   Rapid Strep A Screen Positive (A) Negative    Radiology No results found.  Assessment/Plan  Lymphedema Symptom control is not ideal, but is probably about as good as we can do at this point.  Weight loss and exercise would be of some benefit.  Continue use of compression socks and her lymphedema pump are likely the hallmarks of treatment going forward.  Follow-up in 6 months.  Prediabetes blood glucose control important in reducing the progression of atherosclerotic disease. Also, involved in wound healing. On appropriate medications.     Down syndrome This certainly limits her functional status somewhat, although she has been very functional and survived to the age of 75 thus far.  Her mobility and activity are less than ideal.   Morbid obesity with BMI of 50.0-59.9, adult (HCC) Worsens LE swelling.  Weight loss would be of benefit.  Festus Barren, MD  06/05/2023 12:26 PM    This note was created with Dragon medical transcription system.  Any errors from dictation are purely unintentional

## 2023-06-18 ENCOUNTER — Encounter: Payer: Self-pay | Admitting: Family Medicine

## 2023-06-18 ENCOUNTER — Ambulatory Visit (INDEPENDENT_AMBULATORY_CARE_PROVIDER_SITE_OTHER): Admitting: Family Medicine

## 2023-06-18 ENCOUNTER — Ambulatory Visit: Admitting: General Practice

## 2023-06-18 VITALS — BP 124/76 | HR 63 | Temp 97.7°F | Ht <= 58 in | Wt 248.5 lb

## 2023-06-18 DIAGNOSIS — R1319 Other dysphagia: Secondary | ICD-10-CM

## 2023-06-18 DIAGNOSIS — R1310 Dysphagia, unspecified: Secondary | ICD-10-CM | POA: Insufficient documentation

## 2023-06-18 MED ORDER — FAMOTIDINE 20 MG PO TABS: 20.0000 mg | ORAL_TABLET | Freq: Two times a day (BID) | ORAL | 1 refills | Status: DC

## 2023-06-18 NOTE — Progress Notes (Signed)
 Subjective:    Patient ID: [name], female    DOB: 06-Jul-1970, 53 y.o.   MRN: [id number]  HPI  Wt Readings from Last 3 Encounters:  06/18/23 248 lb 8 oz (112.7 kg)  06/05/23 251 lb 9.6 oz (114.1 kg)  04/23/23 250 lb 8 oz (113.6 kg)   55.71 kg/m  Vitals:   06/18/23 1214  BP: 124/76  Pulse: 63  Temp: 97.7 F (36.5 C)  SpO2: 98%   53 yo pt of Dr Crissie Dome presents with swallowing problems She has a history of down's syndrome, seizures, lymphedema and hypothyroidism    Chokes a lot  Especially when eating  Sometimes when taking pills (large pills)   Every meal  Worse in past 2 weeks   It comes back up sometimes or she will cough Does not stop breathing   Takes little tiny bites  Chews very well   No c/o heartburn or indigestion  Has occational stomach pain - but hard to tell    Feels like food/things get stuck part way down  Bread and meat are the worst  No problems with fluids   No history of aspiration   Occational constipation  Uses stool softener   Had trache about 8 years ago and had swallowing study  For pneumonia  Then swallowing study was fine       Patient Active Problem List   Diagnosis Date Noted   Dysphagia 06/18/2023   Acute respiratory infection 04/11/2023   Strep throat 04/11/2023   Constipation 01/24/2023   Lymphedema 11/22/2021   Varicose veins of leg with swelling, bilateral 08/16/2021   Endometrial hyperplasia, simple 05/23/2021   History of deep vein thrombosis (DVT) of lower extremity 05/23/2021   Swelling of limb 04/19/2021   Cardiomegaly 10/27/2020   Ankle arthritis 03/17/2020   Calcific tendinitis of ankle, right 03/17/2020   Carpal tunnel syndrome of left wrist 01/08/2019   Osteoarthritis of left knee 09/24/2017   Lower back pain 01/08/2017   Epiretinal membrane (ERM) of both eyes 09/29/2016   Seborrheic dermatitis of scalp 09/08/2016   Conductive hearing loss, bilateral 10/19/2015   Macrocytosis without anemia  10/02/2015   Advanced care planning/counseling discussion 09/27/2015   Granulomatous chorioretinitis of both eyes 09/20/2015   Recurrent boils 09/25/2014   Health maintenance examination 07/13/2014   Impacted cerumen 09/05/2013   Dyshidrotic eczema 09/05/2013   Medicare annual wellness visit, subsequent 02/13/2013   Partial epilepsy with impairment of consciousness (HCC) 11/29/2012   Presenile dementia, uncomplicated (HCC) 11/29/2012   Sleep-related hypoventilation 09/23/2012   Chronic pain of right ankle 02/14/2012   OSA on CPAP 11/15/2011   Morbid obesity with BMI of 50.0-59.9, adult (HCC) 11/15/2011   Chronic gout    Hypothyroidism    Seizure (HCC)    Prediabetes    MDD (major depressive disorder), recurrent, in full remission (HCC)    [name]    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 [facility])    neurologist--- dr Janett Medin;   secondary to down syndrome (Trisomy 21-related),  nl CT head 2010, did not tolerate aricept   Down syndrome    Eczema    Edema of both lower  extremities    Endometrial hyperplasia    H/O pleural empyema 09/24/2011   acute  in setting CAP /   s/p left  VATS with drainage empyema,  post op acute respiratory failure --- resolved   History of DVT of lower extremity 05/11/2021   dx left lower extremity popliteal vein,  completed 3 months eliquis  then changed to ASA 81mg    History of recurrent pneumonia    multiple pneumonias   Hypothyroidism    MDD (major depressive disorder)    OA (osteoarthritis)    bilat knee arthritis, had 3/3 Supartz injections Rendell Carrel)   OCD (obsessive compulsive disorder)    OSA on CPAP 05/2012   folowed by dr Matilde Son---- study in epic, severe OSA with AHI  118, desat nadir to 67%   (per pt mother pt uses nightly)   Pre-diabetes    Seizure disorder [facility]) 2013   neurologist--- dr Janett Medin;   (10-28-2021  per pt mother no seizure since 2013)    long hospitalization, x 2 ; "very mild"; during hospitalization "while coming off vent"   Varicose veins of both lower extremities    followed by dr j. dew ---   with venous reflux,   10-25-2021  s/p laser procedure left leg GSV and SF   Wears glasses    Past Surgical History:  Procedure Laterality Date   ACHILLES TENDON REPAIR Right 01/2017   [facility]   ACHILLES TENDON REPAIR Right 01/2017   ANKLE ARTHROSCOPY Right 11/11/2020   @[facility]  by dr Luster Salters;    debridement and stablilzation lateral collateral ligament   ENDOSCOPIC VEIN LASER TREATMENT Left 10/25/2021   by dr j. dew;   left leg GSV and SF for venous reflux   FLEXIBLE BRONCHOSCOPY  09/24/2011   Procedure: FLEXIBLE BRONCHOSCOPY;  Surgeon: Gardenia Jump, MD;  Location: [facility] OR;  Service: Thoracic;  Laterality: N/A;   HYSTEROSCOPY WITH D & C N/A 09/27/2021   Procedure: DILATATION AND CURETTAGE /HYSTEROSCOPY;  Surgeon: Granville Layer, MD;  Location: [facility] OR;  Service: Gynecology;  Laterality: N/A;   INTRAUTERINE DEVICE (IUD) INSERTION N/A 11/07/2021   Procedure: INTRAUTERINE DEVICE (IUD) INSERTION;  Surgeon: Granville Layer, MD;  Location: [facility];  Service: Gynecology;  Laterality: N/A;   LUMBAR DISC SURGERY  07/18/2004   @[facility]  by dr Lamon Pillow;   L4--5 disectomy and left L3--4 nerve decompression   RETINAL LASER PROCEDURE Left 09/2022   TRACHEOSTOMY TUBE PLACEMENT  10/11/2011   Procedure: TRACHEOSTOMY;  Surgeon: Janita Mellow, MD;  Location: [facility] OR;  Service: ENT;  Laterality: N/A;   VIDEO ASSISTED THORACOSCOPY (VATS) /THOROCOTOMY/RADIOACTIVE SEED IMPLANT Left 09/24/2011   VATS, (Dr. Lander Pines)   Social History   Tobacco Use   Smoking status: Never   Smokeless tobacco: Never  Vaping Use   Vaping status: Never Used  Substance  Use Topics   Alcohol use: Never   Drug use: Never   Family History  Problem Relation Age of Onset   Arthritis Mother    Hypertension Father    Diabetes Father    Cancer Neg Hx    Coronary artery disease Neg Hx    Stroke Neg Hx    No Known Allergies Current Outpatient Medications on File Prior to Visit  Medication Sig Dispense Refill   allopurinol  (ZYLOPRIM ) 100 MG tablet Take 1 tablet (100 mg total) by mouth at bedtime. 90 tablet 4   aspirin  EC 81 MG tablet Take 1 tablet (81 mg total) by mouth  daily. Swallow whole.     clobetasol  cream (TEMOVATE ) 0.05 % APPLY TO SKIN RASH 2 TIMES DAILY AS NEEDED. MAX 2 WEEKS AT A TIME, AVOID FACE, UNDERARMS, GROIN. 45 g 0   Ibuprofen  200 MG CAPS Take 3 capsules (600 mg total) by mouth 2 (two) times daily as needed.     levETIRAcetam  (KEPPRA ) 500 MG tablet Take 1 tablet (500 mg total) by mouth 2 (two) times daily. 180 tablet 3   levothyroxine  (SYNTHROID ) 125 MCG tablet Take 1 tablet (125 mcg total) by mouth daily before breakfast. 90 tablet 4   memantine  (NAMENDA ) 10 MG tablet Take 1 tablet (10 mg total) by mouth 2 (two) times daily. 180 tablet 3   Multiple Vitamins-Minerals (WOMENS MULTI PO) Take 1 tablet by mouth at bedtime.     traMADol  (ULTRAM ) 50 MG tablet Take 1 tablet (50 mg total) by mouth 2 (two) times daily as needed for moderate pain (pain score 4-6). 30 tablet 0   No current facility-administered medications on file prior to visit.    Review of Systems  Constitutional:  Negative for activity change, appetite change, fatigue, fever and unexpected weight change.  HENT:  Positive for trouble swallowing. Negative for congestion, ear pain, rhinorrhea, sinus pressure and sore throat.   Eyes:  Negative for pain, redness and visual disturbance.  Respiratory:  Negative for cough, shortness of breath and wheezing.   Cardiovascular:  Negative for chest pain and palpitations.  Gastrointestinal:  Positive for vomiting. Negative for abdominal pain,  blood in stool, constipation and diarrhea.       Occational stomach pain  Not today  Endocrine: Negative for polydipsia and polyuria.  Genitourinary:  Negative for dysuria, frequency and urgency.  Musculoskeletal:  Negative for arthralgias, back pain and myalgias.  Skin:  Negative for pallor and rash.  Allergic/Immunologic: Negative for environmental allergies.  Neurological:  Negative for dizziness, syncope and headaches.  Hematological:  Negative for adenopathy. Does not bruise/bleed easily.  Psychiatric/Behavioral:  Negative for decreased concentration and dysphoric mood. The patient is not nervous/anxious.        Objective:   Physical Exam Constitutional:      General: She is not in acute distress.    Appearance: Normal appearance. She is well-developed. She is obese. She is not ill-appearing or diaphoretic.  HENT:     Head: Normocephalic and atraumatic.     Nose: Nose normal.     Mouth/Throat:     Mouth: Mucous membranes are moist.     Pharynx: Oropharynx is clear.     Comments: Macroglossia   Throat appears normal   No difficulty swallowing saliva  Eyes:     General: No scleral icterus.       Right eye: No discharge.        Left eye: No discharge.     Conjunctiva/sclera: Conjunctivae normal.     Pupils: Pupils are equal, round, and reactive to light.  Neck:     Thyroid : No thyromegaly.     Vascular: No carotid bruit or JVD.     Comments: Neck is short and thick with down's syndrome   Cardiovascular:     Rate and Rhythm: Normal rate and regular rhythm.     Heart sounds: Normal heart sounds.     No gallop.  Pulmonary:     Effort: Pulmonary effort is normal. No respiratory distress.     Breath sounds: Normal breath sounds. No stridor. No wheezing, rhonchi or rales.  Abdominal:  General: There is no distension or abdominal bruit.     Palpations: Abdomen is soft. There is no mass.     Tenderness: There is no abdominal tenderness. There is no guarding or rebound.   Musculoskeletal:     Cervical back: Normal range of motion and neck supple.     Right lower leg: No edema.     Left lower leg: No edema.  Lymphadenopathy:     Cervical: No cervical adenopathy.  Skin:    General: Skin is warm and dry.     Coloration: Skin is not pale.     Findings: No rash.  Neurological:     Mental Status: She is alert.     Coordination: Coordination normal.     Deep Tendon Reflexes: Reflexes are normal and symmetric. Reflexes normal.  Psychiatric:        Mood and Affect: Mood normal.           Assessment & Plan:   Problem List Items Addressed This Visit       Digestive   Dysphagia - Primary   Per history is esophageal but hard to tell Pt indicated food gets stuck part way down and sometimes needs to vomit it back up  Worse with dry foods  Is taking small bites/ eating softer foods and drinking fluids  History of pna in past and trache-but unsure if macroglossia  Exam is reassuring  Macroglossia as expected   Given esophageal symptoms will try pepcid  20 mg bid and then follow up with pcp  Her [name] puts her at risk for dysphagia  If not improved- low threshold for swallowing study  Call back and Er precautions noted in detail today

## 2023-06-18 NOTE — Patient Instructions (Signed)
 Continue keeping bites small  Stick to softer foods if you need to  Avoid foods that trigger  You can wet bread with water or milk     Try the generic pepcid  20 mg twice daily (am and pm)  This helps to control stomach acid and acid reflux which can cause swallowing problems   Follow up with Dr Crissie Dome in a few weeks

## 2023-06-18 NOTE — Assessment & Plan Note (Signed)
 Per history is esophageal but hard to tell Pt indicated food gets stuck part way down and sometimes needs to vomit it back up  Worse with dry foods  Is taking small bites/ eating softer foods and drinking fluids  History of pna in past and trache-but unsure if macroglossia  Exam is reassuring  Macroglossia as expected   Given esophageal symptoms will try pepcid  20 mg bid and then follow up with pcp  Her [name] puts her at risk for dysphagia  If not improved- low threshold for swallowing study  Call back and Er precautions noted in detail today

## 2023-06-25 ENCOUNTER — Encounter: Payer: Self-pay | Admitting: Podiatry

## 2023-06-25 ENCOUNTER — Ambulatory Visit (INDEPENDENT_AMBULATORY_CARE_PROVIDER_SITE_OTHER): Admitting: Podiatry

## 2023-06-25 DIAGNOSIS — M79674 Pain in right toe(s): Secondary | ICD-10-CM

## 2023-06-25 DIAGNOSIS — I890 Lymphedema, not elsewhere classified: Secondary | ICD-10-CM

## 2023-06-25 DIAGNOSIS — B351 Tinea unguium: Secondary | ICD-10-CM | POA: Diagnosis not present

## 2023-06-25 DIAGNOSIS — M79675 Pain in left toe(s): Secondary | ICD-10-CM

## 2023-06-25 NOTE — Progress Notes (Signed)
 This patient returns to my office for at risk foot care.  This patient requires this care by a professional since this patient will be at risk due to having venous stasis and diabetes.  This patient is unable to cut nails herself since the patient cannot reach her nails.These nails are painful walking and wearing shoes.  This patient presents for at risk foot care today.  General Appearance  Alert, conversant and in no acute stress.  Vascular  Dorsalis pedis and posterior tibial  pulses are not palpable due to swelling. bilaterally.  Capillary return is within normal limits  bilaterally. Temperature is within normal limits  bilaterally. Venous stasis legs  B/l.  Neurologic  Senn-Weinstein monofilament wire test within normal limits  bilaterally. Muscle power within normal limits bilaterally.  Nails Thick disfigured discolored nails with subungual debris  from hallux to fifth toes bilaterally. No evidence of bacterial infection or drainage bilaterally.  Orthopedic  No limitations of motion  feet .  No crepitus or effusions noted.  No bony pathology or digital deformities noted.  Skin  normotropic skin with no porokeratosis noted bilaterally.  No signs of infections or ulcers noted.     Onychomycosis  Pain in right toes  Pain in left toes  Consent was obtained for treatment procedures.   Mechanical debridement of nails 1-5  bilaterally performed with a nail nipper.  Filed with dremel without incident.    Return office visit   3  months                   Told patient to return for periodic foot care and evaluation due to potential at risk complications.   Helane Gunther DPM

## 2023-07-02 DIAGNOSIS — G4733 Obstructive sleep apnea (adult) (pediatric): Secondary | ICD-10-CM | POA: Diagnosis not present

## 2023-07-04 ENCOUNTER — Ambulatory Visit: Admitting: Family Medicine

## 2023-07-11 DIAGNOSIS — Z7982 Long term (current) use of aspirin: Secondary | ICD-10-CM | POA: Diagnosis not present

## 2023-07-11 DIAGNOSIS — E039 Hypothyroidism, unspecified: Secondary | ICD-10-CM | POA: Diagnosis not present

## 2023-07-11 DIAGNOSIS — M109 Gout, unspecified: Secondary | ICD-10-CM | POA: Diagnosis not present

## 2023-07-11 DIAGNOSIS — K219 Gastro-esophageal reflux disease without esophagitis: Secondary | ICD-10-CM | POA: Diagnosis not present

## 2023-07-11 DIAGNOSIS — Z8249 Family history of ischemic heart disease and other diseases of the circulatory system: Secondary | ICD-10-CM | POA: Diagnosis not present

## 2023-07-11 DIAGNOSIS — K5900 Constipation, unspecified: Secondary | ICD-10-CM | POA: Diagnosis not present

## 2023-07-11 DIAGNOSIS — F32A Depression, unspecified: Secondary | ICD-10-CM | POA: Diagnosis not present

## 2023-07-11 DIAGNOSIS — R7303 Prediabetes: Secondary | ICD-10-CM | POA: Diagnosis not present

## 2023-07-17 ENCOUNTER — Encounter (INDEPENDENT_AMBULATORY_CARE_PROVIDER_SITE_OTHER): Payer: Self-pay

## 2023-07-24 ENCOUNTER — Ambulatory Visit: Payer: Medicare PPO | Admitting: Family Medicine

## 2023-08-02 DIAGNOSIS — G4733 Obstructive sleep apnea (adult) (pediatric): Secondary | ICD-10-CM | POA: Diagnosis not present

## 2023-08-21 ENCOUNTER — Other Ambulatory Visit: Payer: Self-pay | Admitting: Family Medicine

## 2023-08-21 DIAGNOSIS — R1319 Other dysphagia: Secondary | ICD-10-CM

## 2023-09-01 DIAGNOSIS — G4733 Obstructive sleep apnea (adult) (pediatric): Secondary | ICD-10-CM | POA: Diagnosis not present

## 2023-09-24 ENCOUNTER — Encounter: Payer: Self-pay | Admitting: Podiatry

## 2023-09-24 ENCOUNTER — Ambulatory Visit (INDEPENDENT_AMBULATORY_CARE_PROVIDER_SITE_OTHER): Admitting: Podiatry

## 2023-09-24 DIAGNOSIS — M79675 Pain in left toe(s): Secondary | ICD-10-CM

## 2023-09-24 DIAGNOSIS — B351 Tinea unguium: Secondary | ICD-10-CM

## 2023-09-24 DIAGNOSIS — I890 Lymphedema, not elsewhere classified: Secondary | ICD-10-CM

## 2023-09-24 DIAGNOSIS — M79674 Pain in right toe(s): Secondary | ICD-10-CM

## 2023-09-24 NOTE — Progress Notes (Signed)
 This patient returns to my office for at risk foot care.  This patient requires this care by a professional since this patient will be at risk due to having venous stasis and diabetes.  This patient is unable to cut nails herself since the patient cannot reach her nails.These nails are painful walking and wearing shoes.  This patient presents for at risk foot care today.  General Appearance  Alert, conversant and in no acute stress.  Vascular  Dorsalis pedis and posterior tibial  pulses are not palpable due to swelling. bilaterally.  Capillary return is within normal limits  bilaterally. Temperature is within normal limits  bilaterally. Venous stasis legs  B/l.  Neurologic  Senn-Weinstein monofilament wire test within normal limits  bilaterally. Muscle power within normal limits bilaterally.  Nails Thick disfigured discolored nails with subungual debris  from hallux to fifth toes bilaterally. No evidence of bacterial infection or drainage bilaterally.  Orthopedic  No limitations of motion  feet .  No crepitus or effusions noted.  No bony pathology or digital deformities noted.  Skin  normotropic skin with no porokeratosis noted bilaterally.  No signs of infections or ulcers noted.     Onychomycosis  Pain in right toes  Pain in left toes  Consent was obtained for treatment procedures.   Mechanical debridement of nails 1-5  bilaterally performed with a nail nipper.  Filed with dremel without incident.    Return office visit   3  months                   Told patient to return for periodic foot care and evaluation due to potential at risk complications.   Helane Gunther DPM

## 2023-10-02 DIAGNOSIS — G4733 Obstructive sleep apnea (adult) (pediatric): Secondary | ICD-10-CM | POA: Diagnosis not present

## 2023-11-01 DIAGNOSIS — G4733 Obstructive sleep apnea (adult) (pediatric): Secondary | ICD-10-CM | POA: Diagnosis not present

## 2023-11-18 ENCOUNTER — Other Ambulatory Visit: Payer: Self-pay | Admitting: Family Medicine

## 2023-11-18 DIAGNOSIS — R1319 Other dysphagia: Secondary | ICD-10-CM

## 2023-11-20 NOTE — Progress Notes (Signed)
 [facility] - Clinic Note  12/04/2023     CHIEF COMPLAINT Patient presents for Retina Follow Up   HISTORY OF PRESENT ILLNESS: [name] is a 53 y.o. female who presents to the clinic today for:   HPI     Retina Follow Up   In both eyes.  This started 9 months ago.  Duration of 9 months.  Since onset it is stable.  I, the attending physician,  performed the HPI with the patient and updated documentation appropriately.        Comments   9 month retina follow up  Chorioretinitis OU pt is reporting no vision changes noticed unsure any flashes or floaters       Last edited by Valdemar Rogue, MD on 12/09/2023  1:40 AM.     Pt states she doesn't notice any changes in vision. Has new rx specs. No reports of any floaters or FOL.    Referring physician: Octavia Bruckner, MD [address] [place],  [place] [address]   HISTORICAL INFORMATION:   Selected notes from the MEDICAL RECORD NUMBER Referred by Dr. Octavia LEE:  Ocular Hx- granulomatous chorioretinitis OU -- formerly followed by Dr. Skeet annually -- pt stable and wanted to transfer ophthalmologic care closer to home PMH-previously followed by Dr. Alvia (05.15.12) and Dr. Skeet at [facility] (02.11.22)    CURRENT MEDICATIONS: No current outpatient medications on file. (Ophthalmic Drugs)   No current facility-administered medications for this visit. (Ophthalmic Drugs)   Current Outpatient Medications (Other)  Medication Sig   allopurinol  (ZYLOPRIM ) 100 MG tablet Take 1 tablet (100 mg total) by mouth at bedtime.   aspirin  EC 81 MG tablet Take 1 tablet (81 mg total) by mouth daily. Swallow whole.   clobetasol  cream (TEMOVATE ) 0.05 % APPLY TO SKIN RASH 2 TIMES DAILY AS NEEDED. MAX 2 WEEKS AT A TIME, AVOID FACE, UNDERARMS, GROIN.   famotidine  (PEPCID ) 20 MG tablet TAKE 1 TABLET BY MOUTH TWICE A DAY   Ibuprofen  200 MG CAPS Take 3 capsules (600 mg total) by mouth 2 (two) times daily as needed.    levETIRAcetam  (KEPPRA ) 500 MG tablet Take 1 tablet (500 mg total) by mouth 2 (two) times daily.   levothyroxine  (SYNTHROID ) 125 MCG tablet Take 1 tablet (125 mcg total) by mouth daily before breakfast.   memantine  (NAMENDA ) 10 MG tablet Take 1 tablet (10 mg total) by mouth 2 (two) times daily.   Multiple Vitamins-Minerals (WOMENS MULTI PO) Take 1 tablet by mouth at bedtime.   polyethylene glycol powder (GLYCOLAX/MIRALAX) 17 GM/SCOOP powder Take 8.5-17 g by mouth daily. Hold for diarrhea. Dissolve 1 capful (17g) in 4-8 ounces of liquid and take by mouth daily.   traMADol  (ULTRAM ) 50 MG tablet Take 1 tablet (50 mg total) by mouth 2 (two) times daily as needed for moderate pain (pain score 4-6).   No current facility-administered medications for this visit. (Other)   REVIEW OF SYSTEMS: ROS   Positive for: Neurological, Eyes Negative for: Constitutional, Gastrointestinal, Skin, Genitourinary, Musculoskeletal, HENT, Cardiovascular, Respiratory, Allergic/Imm, Heme/Lymph Last edited by Resa Delon ORN, COT on 12/04/2023 12:51 PM.     ALLERGIES No Known Allergies  PAST MEDICAL HISTORY Past Medical History:  Diagnosis Date   Bruise    per pt mother bruising left leg post op laser procedure on 10-25-2021   Chronic idiopathic gout of knee without tophus    followed by pcp----   affects left knee (10-28-2021  per pt  mother last flare-up several yrs ago)   Dementia due to another medical condition [facility])    neurologist--- dr rosemarie;   secondary to down syndrome (Trisomy 21-related),  nl CT head 2010, did not tolerate aricept   Down syndrome    Eczema    Edema of both lower extremities    Endometrial hyperplasia    H/O pleural empyema 09/24/2011   acute  in setting CAP /   s/p left  VATS with drainage empyema,  post op acute respiratory failure --- resolved   History of DVT of lower extremity 05/11/2021   dx left lower extremity popliteal vein,  completed 3 months eliquis  then changed to ASA  81mg    History of recurrent pneumonia    multiple pneumonias   Hypothyroidism    MDD (major depressive disorder)    OA (osteoarthritis)    bilat knee arthritis, had 3/3 Supartz injections [facility])   OCD (obsessive compulsive disorder)    OSA on CPAP 05/2012   folowed by dr shellia---- study in epic, severe OSA with AHI 118, desat nadir to 67%   (per pt mother pt uses nightly)   Pre-diabetes    Seizure disorder [facility]) 2013   neurologist--- dr rosemarie;   (10-28-2021  per pt mother no seizure since 2013)    long hospitalization, x 2 ; very mild; during hospitalization while coming off vent   Varicose veins of both lower extremities    followed by dr j. dew ---   with venous reflux,   10-25-2021  s/p laser procedure left leg GSV and SF   Wears glasses    Past Surgical History:  Procedure Laterality Date   ACHILLES TENDON REPAIR Right 01/2017   Dr. Floyd [place]   ACHILLES TENDON REPAIR Right 01/2017   ANKLE ARTHROSCOPY Right 11/11/2020   @[facility]  by dr janit;    debridement and stablilzation lateral collateral ligament   ENDOSCOPIC VEIN LASER TREATMENT Left 10/25/2021   by dr j. dew;   left leg GSV and SF for venous reflux   FLEXIBLE BRONCHOSCOPY  09/24/2011   Procedure: FLEXIBLE BRONCHOSCOPY;  Surgeon: Sudie VEAR Laine, MD;  Location: [facility] OR;  Service: Thoracic;  Laterality: N/A;   HYSTEROSCOPY WITH D & C N/A 09/27/2021   Procedure: DILATATION AND CURETTAGE /HYSTEROSCOPY;  Surgeon: Fredirick Glenys RAMAN, MD;  Location: [facility] OR;  Service: Gynecology;  Laterality: N/A;   INTRAUTERINE DEVICE (IUD) INSERTION N/A 11/07/2021   Procedure: INTRAUTERINE DEVICE (IUD) INSERTION;  Surgeon: Fredirick Glenys RAMAN, MD;  Location: [facility];  Service: Gynecology;  Laterality: N/A;   LUMBAR DISC SURGERY  07/18/2004   @[facility]  by dr onetha;   L4--5 disectomy and left L3--4 nerve decompression   RETINAL LASER PROCEDURE Left 09/2022   TRACHEOSTOMY TUBE PLACEMENT  10/11/2011   Procedure: TRACHEOSTOMY;   Surgeon: Ida Loader, MD;  Location: [facility] OR;  Service: ENT;  Laterality: N/A;   VIDEO ASSISTED THORACOSCOPY (VATS) /THOROCOTOMY/RADIOACTIVE SEED IMPLANT Left 09/24/2011   VATS, (Dr. Quin)   FAMILY HISTORY Family History  Problem Relation Age of Onset   Arthritis Mother    Hypertension Father    Diabetes Father    Cancer Neg Hx    Coronary artery disease Neg Hx    Stroke Neg Hx    SOCIAL HISTORY Social History   Tobacco Use   Smoking status: Never   Smokeless tobacco: Never  Vaping Use   Vaping status: Never Used  Substance Use Topics   Alcohol use: Never  Drug use: Never       OPHTHALMIC EXAM:  Base Eye Exam     Visual Acuity (Snellen - Linear)       Right Left   Dist cc 20/50 20/50 -3   Dist ph cc 20/40 -1 20/40 -3         Tonometry (Tonopen, 12:58 PM)       Right Left   Pressure 12 14         Pupils       Pupils Dark Light Shape React APD   Right PERRL 3 2 Round Brisk None   Left PERRL 3 2 Round Brisk None         Visual Fields       Left Right    Full Full         Extraocular Movement       Right Left    Full, Ortho Full, Ortho         Neuro/Psych     Oriented x3: Yes   Mood/Affect: Normal         Dilation     Both eyes: 2.5% Phenylephrine  @ 12:58 PM           Slit Lamp and Fundus Exam     Slit Lamp Exam       Right Left   Lids/Lashes Dermatochalasis - upper lid Dermatochalasis - upper lid   Conjunctiva/Sclera White and quiet White and quiet   Cornea trace PEE clear   Anterior Chamber Deep and clear, narrow temporal angle, no cell or flare Deep and clear, narrow temporal angle, no cell or flare   Iris Round and dilated, mild anterior bowing Round and dilated, mild anterior bowing   Lens 1-2+ Cortical cataract, pigment on anterior capsule, trace Posterior subcapsular cataract 1+ Cortical cataract   Anterior Vitreous mild syneresis, no cell or pigment mild syneresis, no cell or pigment         Fundus Exam        Right Left   Disc 2+mild Pallor, Sharp rim, +PPA/PPP Pink and sharp, +PPA/PPP   C/D Ratio 0.3 0.3   Macula Flat, Blunted foveal reflex, scattered drusen, RPE mottling and clumping Flat, Blunted foveal reflex, mild ERM, RPE mottling, no heme or edema   Vessels attenuated, Tortuous, +pigment clumping along IT arcades attenuated, scattered patches of perivascular pigment clumping, mild tortuosity   Periphery Attached, peripapillary CR atrophy with pigment clumping extending along arcades, scattered pigmented CR scarring, +drusen, reticular degeneration, No RT/RD, ?shallow schisis IT periphery Attached, peripheral bone spicules ST quad, bullous schisis along IT arcades from 0400-0530 -- good laser surrounding, reticular degeneration, no new RT/RD/schisis           Refraction     Wearing Rx       Sphere Cylinder Axis   Right +1.50 +0.75 038   Left +2.50 +0.75 173            IMAGING AND PROCEDURES  Imaging and Procedures for 12/04/2023  OCT, Retina - OU - Both Eyes       Right Eye Quality was borderline. Central Foveal Thickness: 333. Progression has been stable. Findings include no IRF, no SRF, abnormal foveal contour, retinal drusen , epiretinal membrane, macular pucker, pigment epithelial detachment (ERM with blunted foveal contour, mild pucker and trace cystic changes, focal PED temporal mac--stable).   Left Eye Quality was good. Central Foveal Thickness: 362. Progression has been stable. Findings include no IRF, no SRF, abnormal foveal contour,  myopic contour, epiretinal membrane, macular pucker (Mild ERM with blunted foveal contour, bullous schisis IT periphery caught on widefield ).   Notes *Images captured and stored on drive  Diagnosis / Impression:  OD: ERM with blunted foveal contour, mild pucker and trace cystic changes, focal PED temporal mac--stable OS: Mild ERM with blunted foveal contour, bullous schisis IT periphery caught on widefield   Clinical  management:  See below  Abbreviations: NFP - Normal foveal profile. CME - cystoid macular edema. PED - pigment epithelial detachment. IRF - intraretinal fluid. SRF - subretinal fluid. EZ - ellipsoid zone. ERM - epiretinal membrane. ORA - outer retinal atrophy. ORT - outer retinal tubulation. SRHM - subretinal hyper-reflective material. IRHM - intraretinal hyper-reflective material           ASSESSMENT/PLAN:    ICD-10-CM   1. Granulomatous chorioretinitis of both eyes  H30.893 OCT, Retina - OU - Both Eyes    2. Left retinoschisis  H33.102     3. Epiretinal membrane (ERM) of both eyes  H35.373     4. Cortical cataract of both eyes  H26.9     5. Left retinal defect  H33.302       1. Chorioretinitis OU - pt of Dr. Marcey Dunnings at [facility], originally referred by [facility] in 2015 - pt has been stable for last several years without active inflammation on no medications and has been monitored annually - due to recent stability, pt and family transferred retina care closer to home - BCVA 20/40 OD, 20/40 OS -- improved from 20/50 OU; subjectively and objectively stable (BCVA at [facility] 03/2020 was 20/64 OU) - exam shows pigmented CR scarring OU -- no signs of active inflammation - unable to check FA -- no vein access - no retinal or ophthalmic interventions indicated or recommended  - f/u 9-82months, DFE, OCT  2. Retinoschisis OS - bullous retinoschisis IT periphery OU w/ posterior extension to IT arcades -- posterior progression since initial visit in March 2023  - no associated RT/RD OS  - ?shallow schisis IT periphery OD  - s/p laser retinopexy OS (08.01.24) -- good laser surrounding -- stable  - monitor  3. Epiretinal membrane, both eyes -- stable - BCVA 20/40 OD, 20/40 OS - asymptomatic, no metamorphopsia - no indication for surgery at this time - monitor for now - f/u 9-12 mos -- DFE/OCT  4. Cortical Cataract OU - The symptoms of cataract, surgical options, and treatments and risks  were discussed with patient. - discussed diagnosis and progression - not yet visually significant - monitor for now   Ophthalmic Meds Ordered this visit:  No orders of the defined types were placed in this encounter.    Return for 9-29mo chorioretinopathy OU, DFe, OCT.  There are no Patient Instructions on file for this visit.   Explained the diagnoses, plan, and follow up with the patient and they expressed understanding.  Patient expressed understanding of the importance of proper follow up care.   This document serves as a record of services personally performed by Redell JUDITHANN Hans, MD, PhD. It was created on their behalf by Wanda GEANNIE Keens, COT an ophthalmic technician. The creation of this record is the provider's dictation and/or activities during the visit.    Electronically signed by:  Wanda GEANNIE Keens, COT  12/09/23 1:43 AM  This document serves as a record of services personally performed by Redell JUDITHANN Hans, MD, PhD. It was created on their behalf by Almetta Pesa, an ophthalmic technician. The  creation of this record is the provider's dictation and/or activities during the visit.    Electronically signed by: Almetta Pesa, OA, 12/09/23  1:43 AM  Redell JUDITHANN Hans, M.D., Ph.D. Diseases & Surgery of the Retina and Vitreous [facility]  I have reviewed the above documentation for accuracy and completeness, and I agree with the above. Redell JUDITHANN Hans, M.D., Ph.D. 12/09/23 1:43 AM    Abbreviations: M myopia (nearsighted); A astigmatism; H hyperopia (farsighted); P presbyopia; Mrx spectacle prescription;  CTL contact lenses; OD right eye; OS left eye; OU both eyes  XT exotropia; ET esotropia; PEK punctate epithelial keratitis; PEE punctate epithelial erosions; DES dry eye syndrome; MGD meibomian gland dysfunction; ATs artificial tears; PFAT's preservative free artificial tears; NSC nuclear sclerotic cataract; PSC posterior subcapsular cataract;  ERM epi-retinal membrane; PVD posterior vitreous detachment; RD retinal detachment; DM diabetes mellitus; DR diabetic retinopathy; NPDR non-proliferative diabetic retinopathy; PDR proliferative diabetic retinopathy; CSME clinically significant macular edema; DME diabetic macular edema; dbh dot blot hemorrhages; CWS cotton wool spot; POAG primary open angle glaucoma; C/D cup-to-disc ratio; HVF humphrey visual field; GVF goldmann visual field; OCT optical coherence tomography; IOP intraocular pressure; BRVO Branch retinal vein occlusion; CRVO central retinal vein occlusion; CRAO central retinal artery occlusion; BRAO branch retinal artery occlusion; RT retinal tear; SB scleral buckle; PPV pars plana vitrectomy; VH Vitreous hemorrhage; PRP panretinal laser photocoagulation; IVK intravitreal kenalog ; VMT vitreomacular traction; MH Macular hole;  NVD neovascularization of the disc; NVE neovascularization elsewhere; AREDS age related eye disease study; ARMD age related macular degeneration; POAG primary open angle glaucoma; EBMD epithelial/anterior basement membrane dystrophy; ACIOL anterior chamber intraocular lens; IOL intraocular lens; PCIOL posterior chamber intraocular lens; Phaco/IOL phacoemulsification with intraocular lens placement; PRK photorefractive keratectomy; LASIK laser assisted in situ keratomileusis; HTN hypertension; DM diabetes mellitus; COPD chronic obstructive pulmonary disease

## 2023-11-28 ENCOUNTER — Ambulatory Visit: Admitting: Family Medicine

## 2023-11-28 ENCOUNTER — Encounter: Payer: Self-pay | Admitting: Family Medicine

## 2023-11-28 ENCOUNTER — Ambulatory Visit
Admission: RE | Admit: 2023-11-28 | Discharge: 2023-11-28 | Disposition: A | Source: Ambulatory Visit | Attending: Family Medicine | Admitting: Family Medicine

## 2023-11-28 VITALS — BP 118/66 | HR 83 | Temp 97.9°F | Ht <= 58 in | Wt 250.0 lb

## 2023-11-28 DIAGNOSIS — R1084 Generalized abdominal pain: Secondary | ICD-10-CM

## 2023-11-28 DIAGNOSIS — R32 Unspecified urinary incontinence: Secondary | ICD-10-CM | POA: Diagnosis not present

## 2023-11-28 DIAGNOSIS — K5900 Constipation, unspecified: Secondary | ICD-10-CM

## 2023-11-28 LAB — POC URINALSYSI DIPSTICK (AUTOMATED)
Bilirubin, UA: NEGATIVE
Blood, UA: NEGATIVE
Glucose, UA: NEGATIVE
Ketones, UA: NEGATIVE
Leukocytes, UA: NEGATIVE
Nitrite, UA: NEGATIVE
Protein, UA: NEGATIVE
Spec Grav, UA: 1.01 (ref 1.010–1.025)
Urobilinogen, UA: 0.2 U/dL
pH, UA: 6 (ref 5.0–8.0)

## 2023-11-28 MED ORDER — POLYETHYLENE GLYCOL 3350 17 GM/SCOOP PO POWD: 8.5000 g | Freq: Every day | ORAL | 0 refills | Status: AC

## 2023-11-28 MED ORDER — POLYETHYLENE GLYCOL 3350 17 GM/SCOOP PO POWD: 17.0000 g | Freq: Every day | ORAL | 0 refills | Status: DC

## 2023-11-28 NOTE — Progress Notes (Signed)
 Ph: [phone] Fax: [phone]   Patient ID: [name], female    DOB: Dec 01, 1970, 53 y.o.   MRN: [id number]  This visit was conducted in person.  BP 118/66   Pulse 83   Temp 97.9 F (36.6 C) (Oral)   Ht 4' 8 (1.422 m)   Wt 250 lb (113.4 kg)   SpO2 94%   BMI 56.05 kg/m    CC: bladder accidents  Subjective:   HPI: [name] is a 53 y.o. female presenting on 11/28/2023 for Bladder Problem (C/o having urinary accidents. Started 2-3 wks ago. Also, c/o constipation. Pt accompanied by niece/caregiver, Zoe. )   2-3 wk h/o increased bladder accidents, as well as increased constipation noted despite regular use of laxatives - dulcolax (bisacodyl ) and sennosides taking QOD.   Now wearing pads more regularly due to accidents - going through 3 pads/day.   Some central abd pain with bowel movements, straining with bowel movements.  No bowel accidents.   No fevers/chills, nausea/vomiting, blood in stool. No blood in urine.      Relevant past medical, surgical, family and social history reviewed and updated as indicated. Interim medical history since our last visit reviewed. Allergies and medications reviewed and updated. Outpatient Medications Prior to Visit  Medication Sig Dispense Refill   allopurinol  (ZYLOPRIM ) 100 MG tablet Take 1 tablet (100 mg total) by mouth at bedtime. 90 tablet 4   aspirin  EC 81 MG tablet Take 1 tablet (81 mg total) by mouth daily. Swallow whole.     clobetasol  cream (TEMOVATE ) 0.05 % APPLY TO SKIN RASH 2 TIMES DAILY AS NEEDED. MAX 2 WEEKS AT A TIME, AVOID FACE, UNDERARMS, GROIN. 45 g 0   famotidine  (PEPCID ) 20 MG tablet TAKE 1 TABLET BY MOUTH TWICE A DAY 180 tablet 0   Ibuprofen  200 MG CAPS Take 3 capsules (600 mg total) by mouth 2 (two) times daily as needed.     levETIRAcetam  (KEPPRA ) 500 MG tablet Take 1 tablet (500 mg total) by mouth 2 (two) times daily. 180 tablet 3   levothyroxine  (SYNTHROID ) 125 MCG tablet Take 1 tablet (125 mcg total)  by mouth daily before breakfast. 90 tablet 4   memantine  (NAMENDA ) 10 MG tablet Take 1 tablet (10 mg total) by mouth 2 (two) times daily. 180 tablet 3   Multiple Vitamins-Minerals (WOMENS MULTI PO) Take 1 tablet by mouth at bedtime.     traMADol  (ULTRAM ) 50 MG tablet Take 1 tablet (50 mg total) by mouth 2 (two) times daily as needed for moderate pain (pain score 4-6). 30 tablet 0   No facility-administered medications prior to visit.     Per HPI unless specifically indicated in ROS section below Review of Systems  Objective:  BP 118/66   Pulse 83   Temp 97.9 F (36.6 C) (Oral)   Ht 4' 8 (1.422 m)   Wt 250 lb (113.4 kg)   SpO2 94%   BMI 56.05 kg/m   Wt Readings from Last 3 Encounters:  11/28/23 250 lb (113.4 kg)  06/18/23 248 lb 8 oz (112.7 kg)  06/05/23 251 lb 9.6 oz (114.1 kg)      Physical Exam Vitals and nursing note reviewed.  Constitutional:      Appearance: Normal appearance. She is not ill-appearing.  HENT:     Mouth/Throat:     Mouth: Mucous membranes are moist.     Pharynx: Oropharynx is clear. No oropharyngeal exudate.     Comments: macroglossia Eyes:  Extraocular Movements: Extraocular movements intact.     Pupils: Pupils are equal, round, and reactive to light.  Cardiovascular:     Rate and Rhythm: Normal rate and regular rhythm.     Pulses: Normal pulses.     Heart sounds: Normal heart sounds. No murmur heard. Pulmonary:     Effort: Pulmonary effort is normal. No respiratory distress.     Breath sounds: Normal breath sounds. No wheezing, rhonchi or rales.  Abdominal:     General: Bowel sounds are normal. There is no distension.     Palpations: There is no mass.     Tenderness: There is generalized abdominal tenderness (mild-mod). There is no guarding or rebound. Negative signs include Murphy's sign.     Hernia: No hernia is present. There is no hernia in the umbilical area.     Comments: Predominate abd discomfort to LUQ, suprapubic and BLQ   Musculoskeletal:        General: Swelling (nonpitting) present.     Right lower leg: Edema present.     Left lower leg: Edema present.  Neurological:     Mental Status: She is alert.  Psychiatric:        Mood and Affect: Mood normal.        Behavior: Behavior normal.       Results for orders placed or performed in visit on 11/28/23  POCT Urinalysis Dipstick (Automated)   Collection Time: 11/28/23  2:40 PM  Result Value Ref Range   Color, UA yellow    Clarity, UA clear    Glucose, UA Negative Negative   Bilirubin, UA negative    Ketones, UA negative    Spec Grav, UA 1.010 1.010 - 1.025   Blood, UA negative    pH, UA 6.0 5.0 - 8.0   Protein, UA Negative Negative   Urobilinogen, UA 0.2 0.2 or 1.0 E.U./dL   Nitrite, UA negative    Leukocytes, UA Negative Negative  Comprehensive metabolic panel with GFR   Collection Time: 11/28/23  3:04 PM  Result Value Ref Range   Sodium 139 135 - 145 mEq/L   Potassium 4.6 3.5 - 5.1 mEq/L   Chloride 101 96 - 112 mEq/L   CO2 29 19 - 32 mEq/L   Glucose, Bld 109 (H) 70 - 99 mg/dL   BUN 16 6 - 23 mg/dL   Creatinine, Ser 8.87 0.40 - 1.20 mg/dL   Total Bilirubin 0.3 0.2 - 1.2 mg/dL   Alkaline Phosphatase 53 39 - 117 U/L   AST 21 0 - 37 U/L   ALT 23 0 - 35 U/L   Total Protein 7.1 6.0 - 8.3 g/dL   Albumin  4.0 3.5 - 5.2 g/dL   GFR 43.73 (L) >39.99 mL/min   Calcium  9.4 8.4 - 10.5 mg/dL  TSH   Collection Time: 11/28/23  3:04 PM  Result Value Ref Range   TSH 3.25 0.35 - 5.50 uIU/mL  CBC with Differential/Platelet   Collection Time: 11/28/23  3:04 PM  Result Value Ref Range   WBC 5.6 4.0 - 10.5 K/uL   RBC 3.97 3.87 - 5.11 Mil/uL   Hemoglobin 13.9 12.0 - 15.0 g/dL   HCT 57.9 63.9 - 53.9 %   MCV 105.8 (H) 78.0 - 100.0 fl   MCHC 33.2 30.0 - 36.0 g/dL   RDW 85.2 88.4 - 84.4 %   Platelets 205.0 150.0 - 400.0 K/uL   Neutrophils Relative % 64.3 43.0 - 77.0 %   Lymphocytes Relative 22.4 12.0 -  46.0 %   Monocytes Relative 9.6 3.0 - 12.0 %    Eosinophils Relative 2.1 0.0 - 5.0 %   Basophils Relative 1.6 0.0 - 3.0 %   Neutro Abs 3.6 1.4 - 7.7 K/uL   Lymphs Abs 1.3 0.7 - 4.0 K/uL   Monocytes Absolute 0.5 0.1 - 1.0 K/uL   Eosinophils Absolute 0.1 0.0 - 0.7 K/uL   Basophils Absolute 0.1 0.0 - 0.1 K/uL    Assessment & Plan:   Problem List Items Addressed This Visit     Generalized abdominal pain - Primary   Noted on exam.  Check 2 view abdomen series and labwork including UA.  No red flags noted today      Relevant Orders   DG Abd 2 Views   Comprehensive metabolic panel with GFR (Completed)   TSH (Completed)   CBC with Differential/Platelet (Completed)   Constipation   Anticipate this is driving abd pain and urinary accidents.  Reviewed bowel regimen - rec start miralax 1/2-1 capful daily, hold for diarrhea.  Encouraged increased water intake, and good dietary fiber intake. Constipation handout provided.  Check 2 view abdomen films.  Update labs and UA. Update if not improving with these measures.       Relevant Orders   DG Abd 2 Views   Urinary incontinence   Caregiver notes worsening bladder accidents over the past few weeks.  This correlates with increased difficulty with constipation.  Urinalysis today without signs of infection, blood, sugar or other abnormality pointing against infectious cause.  Reviewed avoiding bladder irritants.  See below.       Relevant Orders   POCT Urinalysis Dipstick (Automated) (Completed)     Meds ordered this encounter  Medications   DISCONTD: polyethylene glycol powder (GLYCOLAX/MIRALAX) 17 GM/SCOOP powder    Sig: Take 17 g by mouth daily. Dissolve 1 capful (17g) in 4-8 ounces of liquid and take by mouth daily.    Dispense:  3350 g    Refill:  0   polyethylene glycol powder (GLYCOLAX/MIRALAX) 17 GM/SCOOP powder    Sig: Take 8.5-17 g by mouth daily. Hold for diarrhea. Dissolve 1 capful (17g) in 4-8 ounces of liquid and take by mouth daily.    Dispense:  3350 g    Refill:   0    Orders Placed This Encounter  Procedures   DG Abd 2 Views    Standing Status:   Future    Number of Occurrences:   1    Expiration Date:   11/27/2024    Reason for Exam (SYMPTOM  OR DIAGNOSIS REQUIRED):   generalized abd pain with constipation    Is patient pregnant?:   No    Preferred imaging location?:   [facility]   Comprehensive metabolic panel with GFR   TSH   CBC with Differential/Platelet   POCT Urinalysis Dipstick (Automated)    Patient Instructions  Urine looking ok today without blood or signs of infection This may all be coming from constipation Start taking miralax 1/2-1 capful daily, hold for diarrhea.  Labs and abdominal xray today  Try to limit dark sodas, caffeinated beverages, spicy foods which can all cause bladder irritation.   Follow up plan: No follow-ups on file.  Anton Blas, MD

## 2023-11-28 NOTE — Patient Instructions (Addendum)
 Urine looking ok today without blood or signs of infection This may all be coming from constipation Start taking miralax 1/2-1 capful daily, hold for diarrhea.  Labs and abdominal xray today  Try to limit dark sodas, caffeinated beverages, spicy foods which can all cause bladder irritation.

## 2023-11-29 LAB — CBC WITH DIFFERENTIAL/PLATELET
Basophils Absolute: 0.1 K/uL (ref 0.0–0.1)
Basophils Relative: 1.6 % (ref 0.0–3.0)
Eosinophils Absolute: 0.1 K/uL (ref 0.0–0.7)
Eosinophils Relative: 2.1 % (ref 0.0–5.0)
HCT: 42 % (ref 36.0–46.0)
Hemoglobin: 13.9 g/dL (ref 12.0–15.0)
Lymphocytes Relative: 22.4 % (ref 12.0–46.0)
Lymphs Abs: 1.3 K/uL (ref 0.7–4.0)
MCHC: 33.2 g/dL (ref 30.0–36.0)
MCV: 105.8 fl — ABNORMAL HIGH (ref 78.0–100.0)
Monocytes Absolute: 0.5 K/uL (ref 0.1–1.0)
Monocytes Relative: 9.6 % (ref 3.0–12.0)
Neutro Abs: 3.6 K/uL (ref 1.4–7.7)
Neutrophils Relative %: 64.3 % (ref 43.0–77.0)
Platelets: 205 K/uL (ref 150.0–400.0)
RBC: 3.97 Mil/uL (ref 3.87–5.11)
RDW: 14.7 % (ref 11.5–15.5)
WBC: 5.6 K/uL (ref 4.0–10.5)

## 2023-11-29 LAB — COMPREHENSIVE METABOLIC PANEL WITH GFR
ALT: 23 U/L (ref 0–35)
AST: 21 U/L (ref 0–37)
Albumin: 4 g/dL (ref 3.5–5.2)
Alkaline Phosphatase: 53 U/L (ref 39–117)
BUN: 16 mg/dL (ref 6–23)
CO2: 29 meq/L (ref 19–32)
Calcium: 9.4 mg/dL (ref 8.4–10.5)
Chloride: 101 meq/L (ref 96–112)
Creatinine, Ser: 1.12 mg/dL (ref 0.40–1.20)
GFR: 56.26 mL/min — ABNORMAL LOW (ref >60.00)
Glucose, Bld: 109 mg/dL — ABNORMAL HIGH (ref 70–99)
Potassium: 4.6 meq/L (ref 3.5–5.1)
Sodium: 139 meq/L (ref 135–145)
Total Bilirubin: 0.3 mg/dL (ref 0.2–1.2)
Total Protein: 7.1 g/dL (ref 6.0–8.3)

## 2023-11-29 LAB — TSH: TSH: 3.25 u[IU]/mL (ref 0.35–5.50)

## 2023-12-03 ENCOUNTER — Ambulatory Visit: Payer: Self-pay | Admitting: Family Medicine

## 2023-12-03 DIAGNOSIS — R32 Unspecified urinary incontinence: Secondary | ICD-10-CM | POA: Insufficient documentation

## 2023-12-03 NOTE — Assessment & Plan Note (Addendum)
 Caregiver notes worsening bladder accidents over the past few weeks.  This correlates with increased difficulty with constipation.  Urinalysis today without signs of infection, blood, sugar or other abnormality pointing against infectious cause.  Reviewed avoiding bladder irritants.  See below.

## 2023-12-03 NOTE — Assessment & Plan Note (Signed)
 Anticipate this is driving abd pain and urinary accidents.  Reviewed bowel regimen - rec start miralax 1/2-1 capful daily, hold for diarrhea.  Encouraged increased water intake, and good dietary fiber intake. Constipation handout provided.  Check 2 view abdomen films.  Update labs and UA. Update if not improving with these measures.

## 2023-12-03 NOTE — Assessment & Plan Note (Addendum)
 Noted on exam.  Check 2 view abdomen series and labwork including UA.  No red flags noted today

## 2023-12-04 ENCOUNTER — Encounter (INDEPENDENT_AMBULATORY_CARE_PROVIDER_SITE_OTHER): Payer: Self-pay | Admitting: Ophthalmology

## 2023-12-04 ENCOUNTER — Ambulatory Visit (INDEPENDENT_AMBULATORY_CARE_PROVIDER_SITE_OTHER): Payer: Medicare PPO | Admitting: Ophthalmology

## 2023-12-04 DIAGNOSIS — H35373 Puckering of macula, bilateral: Secondary | ICD-10-CM | POA: Diagnosis not present

## 2023-12-04 DIAGNOSIS — H33102 Unspecified retinoschisis, left eye: Secondary | ICD-10-CM

## 2023-12-04 DIAGNOSIS — H269 Unspecified cataract: Secondary | ICD-10-CM

## 2023-12-04 DIAGNOSIS — H30893 Other chorioretinal inflammations, bilateral: Secondary | ICD-10-CM | POA: Diagnosis not present

## 2023-12-04 DIAGNOSIS — H33302 Unspecified retinal break, left eye: Secondary | ICD-10-CM

## 2023-12-09 ENCOUNTER — Encounter (INDEPENDENT_AMBULATORY_CARE_PROVIDER_SITE_OTHER): Payer: Self-pay | Admitting: Ophthalmology

## 2023-12-11 ENCOUNTER — Encounter (INDEPENDENT_AMBULATORY_CARE_PROVIDER_SITE_OTHER): Payer: Self-pay | Admitting: Vascular Surgery

## 2023-12-11 ENCOUNTER — Ambulatory Visit (INDEPENDENT_AMBULATORY_CARE_PROVIDER_SITE_OTHER): Admitting: Vascular Surgery

## 2023-12-11 VITALS — BP 95/54 | HR 74 | Resp 18 | Ht <= 58 in | Wt 251.6 lb

## 2023-12-11 DIAGNOSIS — I890 Lymphedema, not elsewhere classified: Secondary | ICD-10-CM

## 2023-12-11 DIAGNOSIS — R7303 Prediabetes: Secondary | ICD-10-CM

## 2023-12-11 DIAGNOSIS — Z6841 Body Mass Index (BMI) 40.0 and over, adult: Secondary | ICD-10-CM | POA: Diagnosis not present

## 2023-12-11 DIAGNOSIS — Q909 Down syndrome, unspecified: Secondary | ICD-10-CM

## 2023-12-11 NOTE — Progress Notes (Signed)
 MRN : [id number]  [name] is a 53 y.o. (26-Jul-1970) female who presents with chief complaint of  Chief Complaint  Patient presents with   Follow-up    6 months follow up ankle pain making walking difficult  .  History of Present Illness: Patient returns today in follow up of her lymphedema.  She continues to have persistent and significant swelling in both lower extremities.  The left leg may still be a little worse than the right but they are similar.  No open wounds.  No drainage.  No signs of systemic infection.  No chest pain or shortness of breath.  She is not a great historian.  Her dad says her activity is quite poor.  She does elevate her legs.  Current Outpatient Medications  Medication Sig Dispense Refill   allopurinol  (ZYLOPRIM ) 100 MG tablet Take 1 tablet (100 mg total) by mouth at bedtime. 90 tablet 4   aspirin  EC 81 MG tablet Take 1 tablet (81 mg total) by mouth daily. Swallow whole.     clobetasol  cream (TEMOVATE ) 0.05 % APPLY TO SKIN RASH 2 TIMES DAILY AS NEEDED. MAX 2 WEEKS AT A TIME, AVOID FACE, UNDERARMS, GROIN. 45 g 0   famotidine  (PEPCID ) 20 MG tablet TAKE 1 TABLET BY MOUTH TWICE A DAY 180 tablet 0   Ibuprofen  200 MG CAPS Take 3 capsules (600 mg total) by mouth 2 (two) times daily as needed.     levETIRAcetam  (KEPPRA ) 500 MG tablet Take 1 tablet (500 mg total) by mouth 2 (two) times daily. 180 tablet 3   levothyroxine  (SYNTHROID ) 125 MCG tablet Take 1 tablet (125 mcg total) by mouth daily before breakfast. 90 tablet 4   memantine  (NAMENDA ) 10 MG tablet Take 1 tablet (10 mg total) by mouth 2 (two) times daily. 180 tablet 3   Multiple Vitamins-Minerals (WOMENS MULTI PO) Take 1 tablet by mouth at bedtime.     polyethylene glycol powder (GLYCOLAX/MIRALAX) 17 GM/SCOOP powder Take 8.5-17 g by mouth daily. Hold for diarrhea. Dissolve 1 capful (17g) in 4-8 ounces of liquid and take by mouth daily. 3350 g 0   traMADol  (ULTRAM ) 50 MG tablet Take 1 tablet (50 mg total) by  mouth 2 (two) times daily as needed for moderate pain (pain score 4-6). 30 tablet 0   No current facility-administered medications for this visit.    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 [facility])    neurologist--- dr rosemarie;   secondary to down syndrome (Trisomy 21-related),  nl CT head 2010, did not tolerate aricept   Down syndrome    Eczema    Edema of both lower extremities    Endometrial hyperplasia    H/O pleural empyema 09/24/2011   acute  in setting CAP /   s/p left  VATS with drainage empyema,  post op acute respiratory failure --- resolved   History of DVT of lower extremity 05/11/2021   dx left lower extremity popliteal vein,  completed 3 months eliquis  then changed to ASA 81mg    History of recurrent pneumonia    multiple pneumonias   Hypothyroidism    MDD (major depressive disorder)    OA (  osteoarthritis)    bilat knee arthritis, had 3/3 Supartz injections [facility])   OCD (obsessive compulsive disorder)    OSA on CPAP 05/2012   folowed by dr shellia---- study in epic, severe OSA with AHI 118, desat nadir to 67%   (per pt mother pt uses nightly)   Pre-diabetes    Seizure disorder [facility] 2013   neurologist--- dr rosemarie;   (10-28-2021  per pt mother no seizure since 2013)    long hospitalization, x 2 ; very mild; during hospitalization while coming off vent   Varicose veins of both lower extremities    followed by dr j. Daila Elbert ---   with venous reflux,   10-25-2021  s/p laser procedure left leg GSV and SF   Wears glasses     Past Surgical History:  Procedure Laterality Date   ACHILLES TENDON REPAIR Right 01/2017   Dr. Floyd [place]   ACHILLES TENDON REPAIR Right 01/2017   ANKLE ARTHROSCOPY  Right 11/11/2020   @[facility]  by dr janit;    debridement and stablilzation lateral collateral ligament   ENDOSCOPIC VEIN LASER TREATMENT Left 10/25/2021   by dr j. Seena Face;   left leg GSV and SF for venous reflux   FLEXIBLE BRONCHOSCOPY  09/24/2011   Procedure: FLEXIBLE BRONCHOSCOPY;  Surgeon: Sudie VEAR Laine, MD;  Location: [facility] OR;  Service: Thoracic;  Laterality: N/A;   HYSTEROSCOPY WITH D & C N/A 09/27/2021   Procedure: DILATATION AND CURETTAGE /HYSTEROSCOPY;  Surgeon: Fredirick Glenys RAMAN, MD;  Location: [facility] OR;  Service: Gynecology;  Laterality: N/A;   INTRAUTERINE DEVICE (IUD) INSERTION N/A 11/07/2021   Procedure: INTRAUTERINE DEVICE (IUD) INSERTION;  Surgeon: Fredirick Glenys RAMAN, MD;  Location: [facility];  Service: Gynecology;  Laterality: N/A;   LUMBAR DISC SURGERY  07/18/2004   @[facility]  by dr onetha;   L4--5 disectomy and left L3--4 nerve decompression   RETINAL LASER PROCEDURE Left 09/2022   TRACHEOSTOMY TUBE PLACEMENT  10/11/2011   Procedure: TRACHEOSTOMY;  Surgeon: Ida Loader, MD;  Location: [facility] OR;  Service: ENT;  Laterality: N/A;   VIDEO ASSISTED THORACOSCOPY (VATS) /THOROCOTOMY/RADIOACTIVE SEED IMPLANT Left 09/24/2011   VATS, (Dr. Quin)     Social History   Tobacco Use   Smoking status: Never   Smokeless tobacco: Never  Vaping Use   Vaping status: Never Used  Substance Use Topics   Alcohol use: Never   Drug use: Never      Family History  Problem Relation Age of Onset   Arthritis Mother    Hypertension Father    Diabetes Father    Cancer Neg Hx    Coronary artery disease Neg Hx    Stroke Neg Hx      No Known Allergies    REVIEW OF SYSTEMS (Negative unless checked)   Constitutional: [] Weight loss  [] Fever  [] Chills Cardiac: [] Chest pain   [] Chest pressure   [] Palpitations   [] Shortness of breath when laying flat   [] Shortness of breath at rest   [] Shortness of breath with exertion. Vascular:  [x] Pain in legs with walking   [x] Pain in legs at rest   [] Pain in  legs when laying flat   [] Claudication   [] Pain in feet when walking  [] Pain in feet at rest  [] Pain in feet when laying flat   [] History of DVT   [] Phlebitis   [x] Swelling in legs   [] Varicose veins   [] Non-healing ulcers Pulmonary:   [] Uses home oxygen   [] Productive cough   [] Hemoptysis   []   Wheeze  [] COPD   [] Asthma Neurologic:  [] Dizziness  [] Blackouts   [x] Seizures   [] History of stroke   [] History of TIA  [] Aphasia   [] Temporary blindness   [] Dysphagia   [] Weakness or numbness in arms   [] Weakness or numbness in legs Musculoskeletal:  [] Arthritis   [] Joint swelling   [] Joint pain   [] Low back pain Hematologic:  [] Easy bruising  [] Easy bleeding   [] Hypercoagulable state   [] Anemic  [] Hepatitis Gastrointestinal:  [] Blood in stool   [] Vomiting blood  [x] Gastroesophageal reflux/heartburn   [] Abdominal pain Genitourinary:  [] Chronic kidney disease   [] Difficult urination  [] Frequent urination  [] Burning with urination   [] Hematuria Skin:  [] Rashes   [] Ulcers   [] Wounds Psychological:  [] History of anxiety   [x]  History of major depression.  Physical Examination  BP (!) 95/54   Pulse 74   Resp 18   Ht 4' 8 (1.422 m)   Wt 251 lb 9.6 oz (114.1 kg)   BMI 56.41 kg/m  Gen:  WD/WN, NAD. Typical Downs appearance. Obese  Head: [place]/AT, No temporalis wasting. Ear/Nose/Throat: Hearing grossly intact, nares w/o erythema or drainage Eyes: Conjunctiva clear. Sclera non-icteric Neck: Supple.  Trachea midline Pulmonary:  Good air movement, no use of accessory muscles.  Cardiac: RRR, no JVD Vascular:  Vessel Right Left  Radial Palpable Palpable                          PT Not Palpable Not Palpable  DP 1+ Palpable 1+ Palpable    Musculoskeletal: M/S 5/5 throughout.  No deformity or atrophy. 2+ BLE edema. Neurologic: Sensation grossly intact in extremities.  Symmetrical.  Speech is fluent.  Psychiatric: Judgment and insight are fair.  Not a great historian. Dermatologic: No rashes or ulcers  noted.  No cellulitis or open wounds.      Labs Recent Results (from the past 2160 hours)  POCT Urinalysis Dipstick (Automated)     Status: None   Collection Time: 11/28/23  2:40 PM  Result Value Ref Range   Color, UA yellow    Clarity, UA clear    Glucose, UA Negative Negative   Bilirubin, UA negative    Ketones, UA negative    Spec Grav, UA 1.010 1.010 - 1.025   Blood, UA negative    pH, UA 6.0 5.0 - 8.0   Protein, UA Negative Negative   Urobilinogen, UA 0.2 0.2 or 1.0 E.U./dL   Nitrite, UA negative    Leukocytes, UA Negative Negative  Comprehensive metabolic panel with GFR     Status: Abnormal   Collection Time: 11/28/23  3:04 PM  Result Value Ref Range   Sodium 139 135 - 145 mEq/L   Potassium 4.6 3.5 - 5.1 mEq/L   Chloride 101 96 - 112 mEq/L   CO2 29 19 - 32 mEq/L   Glucose, Bld 109 (H) 70 - 99 mg/dL   BUN 16 6 - 23 mg/dL   Creatinine, Ser 8.87 0.40 - 1.20 mg/dL   Total Bilirubin 0.3 0.2 - 1.2 mg/dL   Alkaline Phosphatase 53 39 - 117 U/L   AST 21 0 - 37 U/L   ALT 23 0 - 35 U/L   Total Protein 7.1 6.0 - 8.3 g/dL   Albumin  4.0 3.5 - 5.2 g/dL   GFR 43.73 (L) >39.99 mL/min    Comment: Calculated using the CKD-EPI Creatinine Equation (2021)   Calcium  9.4 8.4 - 10.5 mg/dL  TSH  Status: None   Collection Time: 11/28/23  3:04 PM  Result Value Ref Range   TSH 3.25 0.35 - 5.50 uIU/mL  CBC with Differential/Platelet     Status: Abnormal   Collection Time: 11/28/23  3:04 PM  Result Value Ref Range   WBC 5.6 4.0 - 10.5 K/uL   RBC 3.97 3.87 - 5.11 Mil/uL   Hemoglobin 13.9 12.0 - 15.0 g/dL   HCT 57.9 63.9 - 53.9 %   MCV 105.8 (H) 78.0 - 100.0 fl   MCHC 33.2 30.0 - 36.0 g/dL   RDW 85.2 88.4 - 84.4 %   Platelets 205.0 150.0 - 400.0 K/uL   Neutrophils Relative % 64.3 43.0 - 77.0 %   Lymphocytes Relative 22.4 12.0 - 46.0 %   Monocytes Relative 9.6 3.0 - 12.0 %   Eosinophils Relative 2.1 0.0 - 5.0 %   Basophils Relative 1.6 0.0 - 3.0 %   Neutro Abs 3.6 1.4 - 7.7 K/uL    Lymphs Abs 1.3 0.7 - 4.0 K/uL   Monocytes Absolute 0.5 0.1 - 1.0 K/uL   Eosinophils Absolute 0.1 0.0 - 0.7 K/uL   Basophils Absolute 0.1 0.0 - 0.1 K/uL    Radiology OCT, Retina - OU - Both Eyes Result Date: 12/09/2023 Right Eye Quality was borderline. Central Foveal Thickness: 333. Progression has been stable. Findings include no IRF, no SRF, abnormal foveal contour, retinal drusen , epiretinal membrane, macular pucker, pigment epithelial detachment (ERM with blunted foveal contour, mild pucker and trace cystic changes, focal PED temporal mac--stable). Left Eye Quality was good. Central Foveal Thickness: 362. Progression has been stable. Findings include no IRF, no SRF, abnormal foveal contour, myopic contour, epiretinal membrane, macular pucker (Mild ERM with blunted foveal contour, bullous schisis IT periphery caught on widefield ). Notes *Images captured and stored on drive Diagnosis / Impression: OD: ERM with blunted foveal contour, mild pucker and trace cystic changes, focal PED temporal mac--stable OS: Mild ERM with blunted foveal contour, bullous schisis IT periphery caught on widefield Clinical management: See below Abbreviations: NFP - Normal foveal profile. CME - cystoid macular edema. PED - pigment epithelial detachment. IRF - intraretinal fluid. SRF - subretinal fluid. EZ - ellipsoid zone. ERM - epiretinal membrane. ORA - outer retinal atrophy. ORT - outer retinal tubulation. SRHM - subretinal hyper-reflective material. IRHM - intraretinal hyper-reflective material  DG Abd 2 Views Result Date: 12/03/2023 EXAM: 2 VIEW XRAY OF THE ABDOMEN 11/28/2023 03:22:41 PM COMPARISON: Abdominal radiograph dated 10/24/2011. CLINICAL HISTORY: Generalized abd pain with constipation. FINDINGS: BOWEL: Mild colonic stool burden diffusely throughout the colon. No dilated small bowel loops. No pneumatosis or pneumoperitoneum. SOFT TISSUES: IUD in midline pelvis. No opaque urinary calculi. BONES: Degenerative  changes of the lumbar spine with levocurvature. IMPRESSION: 1. No bowel obstruction. 2. Mild colonic stool burden. Electronically signed by: Selinda Blue MD 12/03/2023 12:45 PM EDT RP Workstation: [id number]    Assessment/Plan  Lymphedema Persistent, stage II lymphedema.  Similar to last visit.  Possibly slightly improved.  Would benefit from weight loss and activity increasing.  Continue compression and elevation.  Follow-up in 1 year.  Prediabetes blood glucose control important in reducing the progression of atherosclerotic disease. Also, involved in wound healing. On appropriate medications.     Down syndrome This certainly limits her functional status somewhat, although she has been very functional and survived to the age of 67 thus far.  Her mobility and activity are less than ideal.   Morbid obesity with BMI of 50.0-59.9, adult (HCC)  Worsens LE swelling.  Weight loss would be of benefit.  Selinda Gu, MD  12/11/2023 3:18 PM    This note was created with Dragon medical transcription system.  Any errors from dictation are purely unintentional

## 2023-12-11 NOTE — Assessment & Plan Note (Signed)
 Persistent, stage II lymphedema.  Similar to last visit.  Possibly slightly improved.  Would benefit from weight loss and activity increasing.  Continue compression and elevation.  Follow-up in 1 year.

## 2023-12-25 ENCOUNTER — Encounter: Payer: Self-pay | Admitting: Podiatry

## 2023-12-25 ENCOUNTER — Ambulatory Visit (INDEPENDENT_AMBULATORY_CARE_PROVIDER_SITE_OTHER): Admitting: Podiatry

## 2023-12-25 DIAGNOSIS — M79675 Pain in left toe(s): Secondary | ICD-10-CM

## 2023-12-25 DIAGNOSIS — B351 Tinea unguium: Secondary | ICD-10-CM

## 2023-12-25 DIAGNOSIS — M79674 Pain in right toe(s): Secondary | ICD-10-CM

## 2023-12-25 DIAGNOSIS — I890 Lymphedema, not elsewhere classified: Secondary | ICD-10-CM

## 2023-12-25 NOTE — Progress Notes (Addendum)
 This patient returns to my office for at risk foot care.  This patient requires this care by a professional since this patient will be at risk due to having venous stasis and diabetes.  This patient is unable to cut nails herself since the patient cannot reach her nails.These nails are painful walking and wearing shoes.  This patient presents for at risk foot care today.  General Appearance  Alert, conversant and in no acute stress.  Vascular  Dorsalis pedis and posterior tibial  pulses are not palpable due to swelling. bilaterally.  Capillary return is within normal limits  bilaterally. Temperature is within normal limits  bilaterally. Venous stasis legs  B/l.  Neurologic  Senn-Weinstein monofilament wire test within normal limits  bilaterally. Muscle power within normal limits bilaterally.  Nails Thick disfigured discolored nails with subungual debris  from hallux to fifth toes bilaterally. No evidence of bacterial infection or drainage bilaterally.  Orthopedic  No limitations of motion  feet .  No crepitus or effusions noted.  No bony pathology or digital deformities noted.  Skin  normotropic skin with no porokeratosis noted bilaterally.  No signs of infections or ulcers noted.     Onychomycosis  Pain in right toes  Pain in left toes  Consent was obtained for treatment procedures.   Mechanical debridement of nails 1-5  bilaterally performed with a nail nipper.  Filed with dremel without incident.    Return office visit   3  months                   Told patient to return for periodic foot care and evaluation due to potential at risk complications.   Cordella Bold DPM  tomma

## 2024-01-09 DIAGNOSIS — H6123 Impacted cerumen, bilateral: Secondary | ICD-10-CM | POA: Diagnosis not present

## 2024-01-10 ENCOUNTER — Encounter (HOSPITAL_BASED_OUTPATIENT_CLINIC_OR_DEPARTMENT_OTHER): Payer: Self-pay

## 2024-01-21 ENCOUNTER — Ambulatory Visit (INDEPENDENT_AMBULATORY_CARE_PROVIDER_SITE_OTHER)

## 2024-01-21 VITALS — BP 95/54 | Ht <= 58 in | Wt 251.0 lb

## 2024-01-21 DIAGNOSIS — Z0000 Encounter for general adult medical examination without abnormal findings: Secondary | ICD-10-CM | POA: Diagnosis not present

## 2024-01-21 NOTE — Patient Instructions (Signed)
 [name],  Thank you for taking the time for your Medicare Wellness Visit. I appreciate your continued commitment to your health goals. Please review the care plan we discussed, and feel free to reach out if I can assist you further.  Please note that Annual Wellness Visits do not include a physical exam. Some assessments may be limited, especially if the visit was conducted virtually. If needed, we may recommend an in-person follow-up with your provider.  Ongoing Care Seeing your primary care provider every 3 to 6 months helps us  monitor your health and provide consistent, personalized care.   Referrals If a referral was made during today's visit and you haven't received any updates within two weeks, please contact the referred provider directly to check on the status.  Recommended Screenings:  Health Maintenance  Topic Date Due   HIV Screening  Never done   Hepatitis B Vaccine (1 of 3 - 19+ 3-dose series) Never done   Colon Cancer Screening  Never done   Pneumococcal Vaccine for age over 70 (2 of 2 - PCV) 09/11/2020   DTaP/Tdap/Td vaccine (2 - Td or Tdap) 02/14/2023   Breast Cancer Screening  08/29/2023   Medicare Annual Wellness Visit  10/23/2023   COVID-19 Vaccine (4 - 2025-26 season) 10/29/2023   Flu Shot  05/27/2024*   Pap with HPV screening  09/28/2026   Hepatitis C Screening  Completed   Zoster (Shingles) Vaccine  Completed   HPV Vaccine  Aged Out   Meningitis B Vaccine  Aged Out  *Topic was postponed. The date shown is not the original due date.       01/21/2024    4:03 PM  Advanced Directives  Does Patient Have a Medical Advance Directive? No  Would patient like information on creating a medical advance directive? No - Guardian declined    Vision: Annual vision screenings are recommended for early detection of glaucoma, cataracts, and diabetic retinopathy. These exams can also reveal signs of chronic conditions such as diabetes and high blood pressure.  Dental:  Annual dental screenings help detect early signs of oral cancer, gum disease, and other conditions linked to overall health, including heart disease and diabetes.  Please see the attached documents for additional preventive care recommendations.

## 2024-01-22 NOTE — Progress Notes (Signed)
 I connected with  [name] on 01/21/2024 by a audio enabled telemedicine application and verified that I am speaking with the correct person using two identifiers.  Patient Location: Home  Provider Location: Home Office  Persons Participating in Visit: Patient.  I discussed the limitations of evaluation and management by telemedicine. The patient expressed understanding and agreed to proceed.  Vital Signs: Because this visit was a virtual/telehealth visit, some criteria may be missing or patient reported. Any vitals not documented were not able to be obtained and vitals that have been documented are patient reported.  Because this visit was a virtual/telehealth visit,  certain criteria was not obtained, such a blood pressure, CBG if applicable, and timed get up and go. Any medications not marked as taking were not mentioned during the medication reconciliation part of the visit. Any vitals not documented were not able to be obtained due to this being a telehealth visit or patient was unable to self-report a recent blood pressure reading due to a lack of equipment at home via telehealth. Vitals that have been documented are verbally provided by the patient.   This visit was performed by a medical professional under my direct supervision. I was immediately available for consultation/collaboration. I have reviewed and agree with the Annual Wellness Visit documentation.   Chief Complaint  Patient presents with   Medicare Wellness     Subjective:   [name] is a 53 y.o. female who presents for a Medicare Annual Wellness Visit.  Allergies (verified) Patient has no known allergies.   History: Past Medical History:  Diagnosis Date   Bruise    per pt mother bruising left leg post op laser procedure on 10-25-2021   Chronic idiopathic gout of knee without tophus    followed by pcp----   affects left knee (10-28-2021  per pt mother last flare-up several yrs ago)   Dementia due to  another medical condition [facility])    neurologist--- dr rosemarie;   secondary to down syndrome (Trisomy 21-related),  nl CT head 2010, did not tolerate aricept   Down syndrome    Eczema    Edema of both lower extremities    Endometrial hyperplasia    H/O pleural empyema 09/24/2011   acute  in setting CAP /   s/p left  VATS with drainage empyema,  post op acute respiratory failure --- resolved   History of DVT of lower extremity 05/11/2021   dx left lower extremity popliteal vein,  completed 3 months eliquis  then changed to ASA 81mg    History of recurrent pneumonia    multiple pneumonias   Hypothyroidism    MDD (major depressive disorder)    OA (osteoarthritis)    bilat knee arthritis, had 3/3 Supartz injections Debbrah)   OCD (obsessive compulsive disorder)    OSA on CPAP 05/2012   folowed by dr shellia---- study in epic, severe OSA with AHI 118, desat nadir to 67%   (per pt mother pt uses nightly)   Pre-diabetes    Seizure disorder [facility]) 2013   neurologist--- dr rosemarie;   (10-28-2021  per pt mother no seizure since 2013)    long hospitalization, x 2 ; very mild; during hospitalization while coming off vent   Varicose veins of both lower extremities    followed by dr j. dew ---   with venous reflux,   10-25-2021  s/p laser procedure left leg GSV and SF   Wears glasses    Past Surgical History:  Procedure  Laterality Date   ACHILLES TENDON REPAIR Right 01/2017   Dr. Floyd [place]   ACHILLES TENDON REPAIR Right 01/2017   ANKLE ARTHROSCOPY Right 11/11/2020   @[facility]  by dr janit;    debridement and stablilzation lateral collateral ligament   ENDOSCOPIC VEIN LASER TREATMENT Left 10/25/2021   by dr j. dew;   left leg GSV and SF for venous reflux   FLEXIBLE BRONCHOSCOPY  09/24/2011   Procedure: FLEXIBLE BRONCHOSCOPY;  Surgeon: Sudie VEAR Laine, MD;  Location: [facility] OR;  Service: Thoracic;  Laterality: N/A;   HYSTEROSCOPY WITH D & C N/A 09/27/2021   Procedure: DILATATION AND CURETTAGE  /HYSTEROSCOPY;  Surgeon: Fredirick Glenys RAMAN, MD;  Location: [facility] OR;  Service: Gynecology;  Laterality: N/A;   INTRAUTERINE DEVICE (IUD) INSERTION N/A 11/07/2021   Procedure: INTRAUTERINE DEVICE (IUD) INSERTION;  Surgeon: Fredirick Glenys RAMAN, MD;  Location: [facility];  Service: Gynecology;  Laterality: N/A;   LUMBAR DISC SURGERY  07/18/2004   @[facility]  by dr onetha;   L4--5 disectomy and left L3--4 nerve decompression   RETINAL LASER PROCEDURE Left 09/2022   TRACHEOSTOMY TUBE PLACEMENT  10/11/2011   Procedure: TRACHEOSTOMY;  Surgeon: Ida Loader, MD;  Location: [facility] OR;  Service: ENT;  Laterality: N/A;   VIDEO ASSISTED THORACOSCOPY (VATS) /THOROCOTOMY/RADIOACTIVE SEED IMPLANT Left 09/24/2011   VATS, (Dr. Quin)   Family History  Problem Relation Age of Onset   Arthritis Mother    Hypertension Father    Diabetes Father    Cancer Neg Hx    Coronary artery disease Neg Hx    Stroke Neg Hx    Social History   Occupational History   Not on file  Tobacco Use   Smoking status: Never   Smokeless tobacco: Never  Vaping Use   Vaping status: Never Used  Substance and Sexual Activity   Alcohol use: Never   Drug use: Never   Sexual activity: Never    Birth control/protection: None   Tobacco Counseling Counseling given: Not Answered  SDOH Screenings   Food Insecurity: No Food Insecurity (01/21/2024)  Housing: Unknown (01/21/2024)  Transportation Needs: No Transportation Needs (01/21/2024)  Utilities: Not At Risk (01/21/2024)  Alcohol Screen: Low Risk  (10/23/2022)  Depression (PHQ2-9): Low Risk  (01/21/2024)  Recent Concern: Depression (PHQ2-9) - Medium Risk (11/28/2023)  Financial Resource Strain: Low Risk  (10/23/2022)  Physical Activity: Insufficiently Active (01/21/2024)  Social Connections: Socially Isolated (01/21/2024)  Stress: Patient Unable To Answer (01/21/2024)  Tobacco Use: Low Risk  (01/21/2024)  Health Literacy: Adequate Health Literacy (01/21/2024)   See flowsheets for  full screening details  Depression Screen PHQ 2 & 9 Depression Scale- Over the past 2 weeks, how often have you been bothered by any of the following problems? Little interest or pleasure in doing things: 0 Feeling down, depressed, or hopeless (PHQ Adolescent also includes...irritable): 0 PHQ-2 Total Score: 0 Trouble falling or staying asleep, or sleeping too much: 2 Feeling tired or having little energy: 0 Poor appetite or overeating (PHQ Adolescent also includes...weight loss): 0 Feeling bad about yourself - or that you are a failure or have let yourself or your family down: 0 Trouble concentrating on things, such as reading the newspaper or watching television (PHQ Adolescent also includes...like school work): 0 Moving or speaking so slowly that other people could have noticed. Or the opposite - being so fidgety or restless that you have been moving around a lot more than usual: 0 Thoughts that you would be better off dead,  or of hurting yourself in some way: 0 PHQ-9 Total Score: 2 If you checked off any problems, how difficult have these problems made it for you to do your work, take care of things at home, or get along with other people?: Not difficult at all     Goals Addressed             This Visit's Progress    Patient Stated   On track    Increase activity as tolerated       Visit info / Clinical Intake: Medicare Wellness Visit Type:: Subsequent Annual Wellness Visit Persons participating in visit:: patient Information given by:: family Interpreter Needed?: No Pre-visit prep was completed: yes AWV questionnaire completed by patient prior to visit?: no Living arrangements:: with family/others Patient's Overall Health Status Rating: very good Typical amount of pain: some Does pain affect daily life?: no Are you currently prescribed opioids?: no  Dietary Habits and Nutritional Risks How many meals a day?: 2 Eats fruit and vegetables daily?: yes Most meals are  obtained by: having others provide food; eating out In the last 2 weeks, have you had any of the following?: none Diabetic:: no  Functional Status Activities of Daily Living (to include ambulation/medication): Independent Ambulation: Independent Medication Administration: Independent Home Management: Independent Manage your own finances?: (!) no Primary transportation is: family/friends Concerns about vision?: no *vision screening is required for WTM* Concerns about hearing?: no  Fall Screening Falls in the past year?: 0 Number of falls in past year: 0 Was there an injury with Fall?: 0 Fall Risk Category Calculator: 0 Patient Fall Risk Level: Low Fall Risk  Fall Risk Patient at Risk for Falls Due to: No Fall Risks  Home and Transportation Safety: All rugs have non-skid backing?: N/A, no rugs All stairs or steps have railings?: N/A, no stairs Grab bars in the bathtub or shower?: yes Have non-skid surface in bathtub or shower?: yes Good home lighting?: yes Regular seat belt use?: yes Hospital stays in the last year:: no  Cognitive Assessment Difficulty concentrating, remembering, or making decisions? : no Will 6CIT or Mini Cog be Completed: no 6CIT or Mini Cog Declined: patient has a diagnosis of dementia or cognitive impairment  Advance Directives (For Healthcare) Does Patient Have a Medical Advance Directive?: No Would patient like information on creating a medical advance directive?: No - Guardian declined  Reviewed/Updated  Reviewed/Updated: Reviewed All (Medical, Surgical, Family, Medications, Allergies, Care Teams, Patient Goals)        Objective:    Today's Vitals   01/21/24 1559  BP: (!) 95/54  Weight: 251 lb (113.9 kg)  Height: 4' 8 (1.422 m)   Body mass index is 56.27 kg/m.  Current Medications (verified) Outpatient Encounter Medications as of 01/21/2024  Medication Sig   allopurinol  (ZYLOPRIM ) 100 MG tablet Take 1 tablet (100 mg total) by mouth  at bedtime.   aspirin  EC 81 MG tablet Take 1 tablet (81 mg total) by mouth daily. Swallow whole.   clobetasol  cream (TEMOVATE ) 0.05 % APPLY TO SKIN RASH 2 TIMES DAILY AS NEEDED. MAX 2 WEEKS AT A TIME, AVOID FACE, UNDERARMS, GROIN.   famotidine  (PEPCID ) 20 MG tablet TAKE 1 TABLET BY MOUTH TWICE A DAY   Ibuprofen  200 MG CAPS Take 3 capsules (600 mg total) by mouth 2 (two) times daily as needed.   levETIRAcetam  (KEPPRA ) 500 MG tablet Take 1 tablet (500 mg total) by mouth 2 (two) times daily.   levothyroxine  (SYNTHROID ) 125 MCG tablet Take  1 tablet (125 mcg total) by mouth daily before breakfast.   memantine  (NAMENDA ) 10 MG tablet Take 1 tablet (10 mg total) by mouth 2 (two) times daily.   Multiple Vitamins-Minerals (WOMENS MULTI PO) Take 1 tablet by mouth at bedtime.   polyethylene glycol powder (GLYCOLAX /MIRALAX ) 17 GM/SCOOP powder Take 8.5-17 g by mouth daily. Hold for diarrhea. Dissolve 1 capful (17g) in 4-8 ounces of liquid and take by mouth daily.   traMADol  (ULTRAM ) 50 MG tablet Take 1 tablet (50 mg total) by mouth 2 (two) times daily as needed for moderate pain (pain score 4-6).   No facility-administered encounter medications on file as of 01/21/2024.   Hearing/Vision screen Hearing Screening - Comments:: No difficulties Vision Screening - Comments:: Patient wears glasses  Immunizations and Health Maintenance Health Maintenance  Topic Date Due   HIV Screening  Never done   Hepatitis B Vaccines 19-59 Average Risk (1 of 3 - 19+ 3-dose series) Never done   Colonoscopy  Never done   Pneumococcal Vaccine: 50+ Years (2 of 2 - PCV) 09/11/2020   DTaP/Tdap/Td (2 - Td or Tdap) 02/14/2023   Mammogram  08/29/2023   Medicare Annual Wellness (AWV)  10/23/2023   COVID-19 Vaccine (4 - 2025-26 season) 10/29/2023   Influenza Vaccine  05/27/2024 (Originally 09/28/2023)   Cervical Cancer Screening (HPV/Pap Cotest)  09/28/2026   Hepatitis C Screening  Completed   Zoster Vaccines- Shingrix  Completed    HPV VACCINES  Aged Out   Meningococcal B Vaccine  Aged Out        Assessment/Plan:  This is a routine wellness examination for Shanine.  Patient Care Team: Rilla Baller, MD as PCP - General (Family Medicine) Campbell Perish, MD as Referring Physician (Internal Medicine) Janit Thresa HERO, DPM as Consulting Physician (Podiatry)  I have personally reviewed and noted the following in the patient's chart:   Medical and social history Use of alcohol, tobacco or illicit drugs  Current medications and supplements including opioid prescriptions. Functional ability and status Nutritional status Physical activity Advanced directives List of other physicians Hospitalizations, surgeries, and ER visits in previous 12 months Vitals Screenings to include cognitive, depression, and falls Referrals and appointments  No orders of the defined types were placed in this encounter.  In addition, I have reviewed and discussed with patient certain preventive protocols, quality metrics, and best practice recommendations. A written personalized care plan for preventive services as well as general preventive health recommendations were provided to patient.   Lyle MARLA Right, CMA   01/21/2024  No follow-ups on file.  After Visit Summary: (MyChart) Due to this being a telephonic visit, the after visit summary with patients personalized plan was offered to patient via MyChart   Nurse Notes: nothing to report

## 2024-01-29 ENCOUNTER — Ambulatory Visit: Admitting: Family Medicine

## 2024-01-29 ENCOUNTER — Telehealth: Payer: Self-pay

## 2024-01-29 ENCOUNTER — Encounter: Payer: Self-pay | Admitting: Family Medicine

## 2024-01-29 VITALS — BP 100/68 | Temp 97.6°F | Ht <= 58 in | Wt 243.0 lb

## 2024-01-29 DIAGNOSIS — N8501 Benign endometrial hyperplasia: Secondary | ICD-10-CM

## 2024-01-29 DIAGNOSIS — Q909 Down syndrome, unspecified: Secondary | ICD-10-CM

## 2024-01-29 DIAGNOSIS — F0390 Unspecified dementia without behavioral disturbance: Secondary | ICD-10-CM

## 2024-01-29 DIAGNOSIS — R1084 Generalized abdominal pain: Secondary | ICD-10-CM

## 2024-01-29 DIAGNOSIS — Z6841 Body Mass Index (BMI) 40.0 and over, adult: Secondary | ICD-10-CM

## 2024-01-29 DIAGNOSIS — Z0000 Encounter for general adult medical examination without abnormal findings: Secondary | ICD-10-CM | POA: Diagnosis not present

## 2024-01-29 DIAGNOSIS — M1A0620 Idiopathic chronic gout, left knee, without tophus (tophi): Secondary | ICD-10-CM

## 2024-01-29 DIAGNOSIS — G4733 Obstructive sleep apnea (adult) (pediatric): Secondary | ICD-10-CM

## 2024-01-29 DIAGNOSIS — E039 Hypothyroidism, unspecified: Secondary | ICD-10-CM

## 2024-01-29 DIAGNOSIS — I890 Lymphedema, not elsewhere classified: Secondary | ICD-10-CM

## 2024-01-29 DIAGNOSIS — R1319 Other dysphagia: Secondary | ICD-10-CM

## 2024-01-29 DIAGNOSIS — Z7189 Other specified counseling: Secondary | ICD-10-CM

## 2024-01-29 DIAGNOSIS — K5900 Constipation, unspecified: Secondary | ICD-10-CM

## 2024-01-29 DIAGNOSIS — Z23 Encounter for immunization: Secondary | ICD-10-CM | POA: Diagnosis not present

## 2024-01-29 DIAGNOSIS — R7303 Prediabetes: Secondary | ICD-10-CM

## 2024-01-29 DIAGNOSIS — F3342 Major depressive disorder, recurrent, in full remission: Secondary | ICD-10-CM

## 2024-01-29 DIAGNOSIS — R569 Unspecified convulsions: Secondary | ICD-10-CM

## 2024-01-29 NOTE — Telephone Encounter (Unsigned)
 Copied from CRM #[id number]. Topic: General - Other >> Jan 29, 2024  4:37 PM Alfonso HERO wrote: Reason for CRM: [place] from [facility] calling to ask if there is anything else besides the abdominal pain being assessed. Call back number is [phone] opt# 1 then option # [phone].[id number]

## 2024-01-29 NOTE — Progress Notes (Unsigned)
 Ph: [phone] Fax: [phone]   Patient ID: [name], female    DOB: 1970-05-09, 53 y.o.   MRN: [id number]  This visit was conducted in person.  BP 100/68 (Cuff Size: Small)   Temp 97.6 F (36.4 C) (Oral)   Ht 4' 8.69 (1.44 m)   Wt 243 lb (110.2 kg)   SpO2 99%   BMI 53.16 kg/m    CC: CPE Subjective:   HPI: [name] is a 53 y.o. female presenting on 01/29/2024 for Annual Exam (Concerns regarding dementia progression, over sleeping, would like discuss weight loss options)   Saw health advisor last week for medicare wellness visit. Note reviewed.  Not fasting today.   No results found.  Flowsheet Row Clinical Support from 01/21/2024 in [facility] [place]  PHQ-2 Total Score 0       01/21/2024    4:03 PM 11/28/2023    2:34 PM 04/23/2023   12:02 PM 04/11/2023   11:34 AM 10/23/2022   11:33 AM  Fall Risk   Falls in the past year? 0 0 0 0 0  Number falls in past yr: 0 0   0  Injury with Fall? 0     0   Risk for fall due to : No Fall Risks No Fall Risks   No Fall Risks  Follow up     Falls prevention discussed;Falls evaluation completed     Data saved with a previous flowsheet row definition    Chronic leg swelling with lymphedema followed by [facility] s/p laser ablation of L great saphenous vein 09/2021. Continues compression sock use. H/o DVT 04/2021 treated with eliquis . Lymphedema pumps recently started - wears daily for 1 hour. Chronic R ankle pain s/p surgery.    OSA - continues autoCPAP through [facility] followed by pulm  Seizure - yearly neuro eval. Remote seizure stable period on keppra  500mg  bid. Also on namenda  10mg  BID for down syndrome related dementia.   Episode of vaginal bleeding 2023 after eliquis  - saw GYN s/p EMB showing endometrial hyperplasia without atypia, treating with progesterone IUD with planned yearly sampling under anesthesia.  Due for f/u.    Constipation - taking dulcolax (bisacodyl ) and sennosides every  other day. She is also on miralax  1/2 capful every other day. Having 1 soft formed stool daily to every other day.   Ongoing intermittent abdominal pain for months, points to mid abdomen, no dietary triggers identified ie fatty meals, can occur during BM as well as randomly throughout the day.   Preventative: Colon cancer screening - declined colonoscopy. Unable to complete cologuard, agrees to try iFOB this year Well woman - with OBGYN exam under anesthesia - normal pap (absent transformation zone) 09/2021. Due for repeat - they will try to schedule in the coming year.  LMP none since IUD placed 2023  [facility] - due for repeat Lung cancer screening - not eligible  Flu shot - yearly  COVID vaccine - Pfizer x2 05/2019, booster x1 Pneumovax 2013.  Tdap - 01/2013.  Shingrix - 12/2022, 02/2023 Advanced directives: mom would be HCPOA - asked to bring us  a copy.  Seat belt use discussed.  Sunscreen use discussed. No changing moles.  Non smoker  Alcohol - none  Dentist - upcoming 01/2024  Eye exam Q6 mo - retinologist Dr Valdemar, Dr Skeet is regular eye doctor  Bowel - possible constipation doing better on stool softener  Bladder -  no incontinence  Memory difficulty - now on namenda  10mg  bid Mood - sertraline  worsened mood trouble, now off all antidepressants  Hearing well    Lives with mom and dad, dogs Stays at home during the day, no longer goes to adult daycare Activity: none - activity limited by pain Diet: some water, likes coke zero, fruits/vegetables daily - encouraged limited sugar/carbs     Relevant past medical, surgical, family and social history reviewed and updated as indicated. Interim medical history since our last visit reviewed. Allergies and medications reviewed and updated. Outpatient Medications Prior to Visit  Medication Sig Dispense Refill   aspirin  EC 81 MG tablet Take 1 tablet (81 mg total) by mouth daily. Swallow whole.      clobetasol  cream (TEMOVATE ) 0.05 % APPLY TO SKIN RASH 2 TIMES DAILY AS NEEDED. MAX 2 WEEKS AT A TIME, AVOID FACE, UNDERARMS, GROIN. 45 g 0   Ibuprofen  200 MG CAPS Take 3 capsules (600 mg total) by mouth 2 (two) times daily as needed.     levETIRAcetam  (KEPPRA ) 500 MG tablet Take 1 tablet (500 mg total) by mouth 2 (two) times daily. 180 tablet 3   memantine  (NAMENDA ) 10 MG tablet Take 1 tablet (10 mg total) by mouth 2 (two) times daily. 180 tablet 3   Multiple Vitamins-Minerals (WOMENS MULTI PO) Take 1 tablet by mouth at bedtime.     polyethylene glycol powder (GLYCOLAX /MIRALAX ) 17 GM/SCOOP powder Take 8.5-17 g by mouth daily. Hold for diarrhea. Dissolve 1 capful (17g) in 4-8 ounces of liquid and take by mouth daily. 3350 g 0   traMADol  (ULTRAM ) 50 MG tablet Take 1 tablet (50 mg total) by mouth 2 (two) times daily as needed for moderate pain (pain score 4-6). 30 tablet 0   allopurinol  (ZYLOPRIM ) 100 MG tablet Take 1 tablet (100 mg total) by mouth at bedtime. 90 tablet 4   famotidine  (PEPCID ) 20 MG tablet TAKE 1 TABLET BY MOUTH TWICE A DAY 180 tablet 0   levothyroxine  (SYNTHROID ) 125 MCG tablet Take 1 tablet (125 mcg total) by mouth daily before breakfast. 90 tablet 4   No facility-administered medications prior to visit.     Per HPI unless specifically indicated in ROS section below Review of Systems  Constitutional:  Negative for activity change, appetite change, chills, fatigue, fever and unexpected weight change.  HENT:  Negative for hearing loss.   Eyes:  Negative for visual disturbance.  Respiratory:  Negative for cough, chest tightness, shortness of breath and wheezing.   Cardiovascular:  Positive for leg swelling. Negative for chest pain and palpitations.  Gastrointestinal:  Positive for abdominal distention, abdominal pain and constipation. Negative for blood in stool, diarrhea, nausea and vomiting.  Genitourinary:  Negative for difficulty urinating and hematuria.  Musculoskeletal:   Negative for arthralgias, myalgias and neck pain.  Skin:  Negative for rash.  Neurological:  Negative for dizziness, seizures, syncope and headaches.  Hematological:  Negative for adenopathy. Does not bruise/bleed easily.  Psychiatric/Behavioral:  Positive for dysphoric mood. The patient is not nervous/anxious.     Objective:  BP 100/68 (Cuff Size: Small)   Temp 97.6 F (36.4 C) (Oral)   Ht 4' 8.69 (1.44 m)   Wt 243 lb (110.2 kg)   SpO2 99%   BMI 53.16 kg/m   Wt Readings from Last 3 Encounters:  01/29/24 243 lb (110.2 kg)  01/21/24 251 lb (113.9 kg)  12/11/23 251 lb 9.6 oz (114.1 kg)      Physical Exam Vitals and  nursing note reviewed.  Constitutional:      Appearance: Normal appearance. She is not ill-appearing.  HENT:     Head: Normocephalic and atraumatic.     Right Ear: Tympanic membrane, ear canal and external ear normal. There is no impacted cerumen.     Left Ear: Tympanic membrane, ear canal and external ear normal. There is no impacted cerumen.     Mouth/Throat:     Mouth: Mucous membranes are moist.     Pharynx: Oropharynx is clear. No oropharyngeal exudate or posterior oropharyngeal erythema.  Eyes:     General:        Right eye: No discharge.        Left eye: No discharge.     Extraocular Movements: Extraocular movements intact.     Conjunctiva/sclera: Conjunctivae normal.     Pupils: Pupils are equal, round, and reactive to light.  Neck:     Thyroid : No thyroid  mass or thyromegaly.     Vascular: No carotid bruit.  Cardiovascular:     Rate and Rhythm: Normal rate and regular rhythm.     Pulses: Normal pulses.     Heart sounds: Normal heart sounds. No murmur heard. Pulmonary:     Effort: Pulmonary effort is normal. No respiratory distress.     Breath sounds: Normal breath sounds. No wheezing, rhonchi or rales.  Abdominal:     General: Bowel sounds are normal. There is no distension.     Palpations: Abdomen is soft. There is no mass.     Tenderness:  There is no abdominal tenderness. There is no guarding or rebound.     Hernia: No hernia is present.  Musculoskeletal:     Cervical back: Normal range of motion and neck supple. No rigidity.     Right lower leg: No edema.     Left lower leg: No edema.  Lymphadenopathy:     Cervical: No cervical adenopathy.  Skin:    General: Skin is warm and dry.     Findings: No rash.  Neurological:     General: No focal deficit present.     Mental Status: She is alert. Mental status is at baseline.  Psychiatric:        Mood and Affect: Mood normal.        Behavior: Behavior normal.       Results for orders placed or performed in visit on 11/28/23  POCT Urinalysis Dipstick (Automated)   Collection Time: 11/28/23  2:40 PM  Result Value Ref Range   Color, UA yellow    Clarity, UA clear    Glucose, UA Negative Negative   Bilirubin, UA negative    Ketones, UA negative    Spec Grav, UA 1.010 1.010 - 1.025   Blood, UA negative    pH, UA 6.0 5.0 - 8.0   Protein, UA Negative Negative   Urobilinogen, UA 0.2 0.2 or 1.0 E.U./dL   Nitrite, UA negative    Leukocytes, UA Negative Negative  Comprehensive metabolic panel with GFR   Collection Time: 11/28/23  3:04 PM  Result Value Ref Range   Sodium 139 135 - 145 mEq/L   Potassium 4.6 3.5 - 5.1 mEq/L   Chloride 101 96 - 112 mEq/L   CO2 29 19 - 32 mEq/L   Glucose, Bld 109 (H) 70 - 99 mg/dL   BUN 16 6 - 23 mg/dL   Creatinine, Ser 8.87 0.40 - 1.20 mg/dL   Total Bilirubin 0.3 0.2 - 1.2 mg/dL  Alkaline Phosphatase 53 39 - 117 U/L   AST 21 0 - 37 U/L   ALT 23 0 - 35 U/L   Total Protein 7.1 6.0 - 8.3 g/dL   Albumin  4.0 3.5 - 5.2 g/dL   GFR 43.73 (L) >39.99 mL/min   Calcium  9.4 8.4 - 10.5 mg/dL  TSH   Collection Time: 11/28/23  3:04 PM  Result Value Ref Range   TSH 3.25 0.35 - 5.50 uIU/mL  CBC with Differential/Platelet   Collection Time: 11/28/23  3:04 PM  Result Value Ref Range   WBC 5.6 4.0 - 10.5 K/uL   RBC 3.97 3.87 - 5.11 Mil/uL    Hemoglobin 13.9 12.0 - 15.0 g/dL   HCT 57.9 63.9 - 53.9 %   MCV 105.8 (H) 78.0 - 100.0 fl   MCHC 33.2 30.0 - 36.0 g/dL   RDW 85.2 88.4 - 84.4 %   Platelets 205.0 150.0 - 400.0 K/uL   Neutrophils Relative % 64.3 43.0 - 77.0 %   Lymphocytes Relative 22.4 12.0 - 46.0 %   Monocytes Relative 9.6 3.0 - 12.0 %   Eosinophils Relative 2.1 0.0 - 5.0 %   Basophils Relative 1.6 0.0 - 3.0 %   Neutro Abs 3.6 1.4 - 7.7 K/uL   Lymphs Abs 1.3 0.7 - 4.0 K/uL   Monocytes Absolute 0.5 0.1 - 1.0 K/uL   Eosinophils Absolute 0.1 0.0 - 0.7 K/uL   Basophils Absolute 0.1 0.0 - 0.1 K/uL      01/21/2024    4:05 PM 11/28/2023    2:34 PM 04/23/2023   12:02 PM 04/11/2023   11:34 AM 10/20/2021   11:38 AM  Depression screen PHQ 2/9  Decreased Interest 0 3 0 0 0  Down, Depressed, Hopeless 0 0 0 0 0  PHQ - 2 Score 0 3 0 0 0  Altered sleeping 2 1   0  Tired, decreased energy 0 3   0  Change in appetite 0 3   0  Feeling bad or failure about yourself  0 0   0  Trouble concentrating 0 0   0  Moving slowly or fidgety/restless 0 0   0  Suicidal thoughts 0 0   0  PHQ-9 Score 2 10    0   Difficult doing work/chores Not difficult at all Somewhat difficult   Not difficult at all     Data saved with a previous flowsheet row definition       11/28/2023    2:34 PM 04/23/2023   12:02 PM 04/11/2023   11:34 AM 10/18/2020    2:19 PM  GAD 7 : Generalized Anxiety Score  Nervous, Anxious, on Edge 0 0 1 0  Control/stop worrying 0 0 1 0  Worry too much - different things 0 0 1 0  Trouble relaxing 0 0 0 0  Restless 0 0 0 0  Easily annoyed or irritable 0 0 0 0  Afraid - awful might happen 0 0 0 0  Total GAD 7 Score 0 0 3 0   Assessment & Plan:   Problem List Items Addressed This Visit     Health maintenance examination - Primary (Chronic)   Preventative protocols reviewed and updated unless pt declined. Discussed healthy diet and lifestyle.       Advanced care planning/counseling discussion (Chronic)   Asked to bring  us  a copy as have this at home. Mom [name] is HCPOA.       Hypothyroidism   Update TSH  on levothyroxine .       Relevant Medications   levothyroxine  (SYNTHROID ) 125 MCG tablet   Other Relevant Orders   TSH   Seizure (HCC)   Occurred remotely 2013 during acute illness/hospitalization. No recurrent symptoms.  Continues keppra .       Prediabetes   Update A1c.       Relevant Orders   Hemoglobin A1c   MDD (major depressive disorder), recurrent, in full remission   Stable period off medication. Sertraline  previously worsened mood.       [name]   Stable period without increased level of care needed.       Chronic gout   Update uric acid on low dose allopurinol  - without recent gout flares.       Relevant Medications   allopurinol  (ZYLOPRIM ) 100 MG tablet   Other Relevant Orders   Uric acid   OSA on CPAP   Chronic, continue nightly autoCPAP through [employer] [facility], sees pulm.       Morbid obesity with BMI of 50.0-59.9, adult (HCC)   Encourage healthy diet and lifestyle to effect sustainable weight loss. Activity limited by joint pains       Relevant Orders   Lipid panel   Presenile dementia, uncomplicated (HCC)   Related to [name]. Continue namenda , followed by neurology.       Generalized abdominal pain   Ongoing issue for at least several months, points to epigastric area as predominant site of pain.  Initially thought related to constipation however mom denies significant trouble with this on her regular bowel regimen.  Start evaluation with abdominal US , she will also return for fasting labwork.       Relevant Orders   US  Abdomen Complete   Comprehensive metabolic panel with GFR   CBC with Differential/Platelet   Lipase   Endometrial hyperplasia, simple   Overdue for GYN f/u - they will work towards this in the new year.  Now with progesterone IUD in place.       Lymphedema   Sees [facility], continue compression and elevation.        Constipation   Stable period on current regimen (PRN bisacodyl , senna, miralax  1/2 capful at a time)      Dysphagia   Relevant Medications   famotidine  (PEPCID ) 20 MG tablet   Other Visit Diagnoses       Encounter for immunization       Relevant Orders   Flu vaccine trivalent PF, 6mos and older(Flulaval,Afluria,Fluarix,Fluzone) (Completed)   Pneumococcal conjugate vaccine 20-valent (Prevnar 20) (Completed)        Meds ordered this encounter  Medications   allopurinol  (ZYLOPRIM ) 100 MG tablet    Sig: Take 1 tablet (100 mg total) by mouth at bedtime.    Dispense:  90 tablet    Refill:  3   famotidine  (PEPCID ) 20 MG tablet    Sig: Take 1 tablet (20 mg total) by mouth 2 (two) times daily.    Dispense:  180 tablet    Refill:  1   levothyroxine  (SYNTHROID ) 125 MCG tablet    Sig: Take 1 tablet (125 mcg total) by mouth daily before breakfast.    Dispense:  90 tablet    Refill:  3    Orders Placed This Encounter  Procedures   US  Abdomen Complete    Standing Status:   Future    Expiration Date:   01/28/2025    Reason for Exam (SYMPTOM  OR DIAGNOSIS REQUIRED):   gen abd  pain    Preferred imaging location?:   [facility]   Flu vaccine trivalent PF, 6mos and older(Flulaval,Afluria,Fluarix,Fluzone)   Pneumococcal conjugate vaccine 20-valent (Prevnar 20)   Lipid panel    Standing Status:   Future    Expiration Date:   01/29/2025   Comprehensive metabolic panel with GFR    Standing Status:   Future    Expiration Date:   01/29/2025   TSH    Standing Status:   Future    Expiration Date:   01/29/2025   CBC with Differential/Platelet    Standing Status:   Future    Expiration Date:   01/29/2025   Uric acid    Standing Status:   Future    Expiration Date:   01/29/2025   Lipase    Standing Status:   Future    Expiration Date:   01/29/2025   Hemoglobin A1c    Standing Status:   Future    Expiration Date:   01/29/2025    Patient Instructions  Flu shot today.  Prevnar-20  today.  Return at your convenience for fasting labs  I will order abdominal ultrasound to [facility] imaging - you may call them to schedule this.  Pass by lab to pick up stool kit.  Schedule mammogram and GYN appt at your convenience.  Bring us  a copy of your advanced directive.  Return as needed or in 3-4 months for follow up visit, let me know sooner if worsening pain or symptoms.   Follow up plan: Return in about 3 months (around 04/28/2024) for follow up visit.  Anton Blas, MD

## 2024-01-29 NOTE — Patient Instructions (Addendum)
 Flu shot today.  Prevnar-20 today.  Return at your convenience for fasting labs  I will order abdominal ultrasound to [facility] imaging - you may call them to schedule this.  Pass by lab to pick up stool kit.  Schedule mammogram and GYN appt at your convenience.  Bring us  a copy of your advanced directive.  Return as needed or in 3-4 months for follow up visit, let me know sooner if worsening pain or symptoms.

## 2024-01-30 ENCOUNTER — Encounter (HOSPITAL_BASED_OUTPATIENT_CLINIC_OR_DEPARTMENT_OTHER)

## 2024-01-30 MED ORDER — LEVOTHYROXINE SODIUM 125 MCG PO TABS: 125.0000 ug | ORAL_TABLET | Freq: Every day | ORAL | 3 refills | Status: AC

## 2024-01-30 MED ORDER — FAMOTIDINE 20 MG PO TABS: 20.0000 mg | ORAL_TABLET | Freq: Two times a day (BID) | ORAL | 1 refills | Status: AC

## 2024-01-30 MED ORDER — ALLOPURINOL 100 MG PO TABS: 100.0000 mg | ORAL_TABLET | Freq: Every day | ORAL | 3 refills | Status: AC

## 2024-01-30 NOTE — Assessment & Plan Note (Addendum)
 Ongoing issue for at least several months, points to epigastric area as predominant site of pain.  Initially thought related to constipation however mom denies significant trouble with this on her regular bowel regimen.  Start evaluation with abdominal US , she will also return for fasting labwork.

## 2024-01-30 NOTE — Assessment & Plan Note (Signed)
 Overdue for GYN f/u - they will work towards this in the new year.  Now with progesterone IUD in place.

## 2024-01-30 NOTE — Assessment & Plan Note (Signed)
 Preventative protocols reviewed and updated unless pt declined. Discussed healthy diet and lifestyle.

## 2024-01-30 NOTE — Telephone Encounter (Signed)
 Generalized abdominal pain for months, predominantly epigastric region but diffusely throughout as well.  Would want pancreas and liver/gallbladder as well as kidneys.

## 2024-01-30 NOTE — Assessment & Plan Note (Signed)
 Update uric acid on low dose allopurinol  - without recent gout flares.

## 2024-01-30 NOTE — Assessment & Plan Note (Signed)
 Stable period off medication. Sertraline  previously worsened mood.

## 2024-01-30 NOTE — Telephone Encounter (Signed)
 Called closed for the day will call back later.

## 2024-01-30 NOTE — Assessment & Plan Note (Signed)
Update TSH on levothyroxine.  

## 2024-01-30 NOTE — Assessment & Plan Note (Signed)
 Sees VVS, continue compression and elevation.

## 2024-01-30 NOTE — Assessment & Plan Note (Signed)
 Stable period without increased level of care needed.

## 2024-01-30 NOTE — Assessment & Plan Note (Signed)
 Related to [name]. Continue namenda , followed by neurology.

## 2024-01-30 NOTE — Assessment & Plan Note (Signed)
 Stable period on current regimen (PRN bisacodyl , senna, miralax  1/2 capful at a time)

## 2024-01-30 NOTE — Assessment & Plan Note (Deleted)
 During acute illness/hospitalization. No recurrent symptoms.  Continues keppra .

## 2024-01-30 NOTE — Assessment & Plan Note (Signed)
 Update A1c ?

## 2024-01-30 NOTE — Assessment & Plan Note (Signed)
 Asked to bring us  a copy as have this at home. Mom [name] is HCPOA.

## 2024-01-30 NOTE — Assessment & Plan Note (Signed)
 Chronic, continue nightly autoCPAP through [facility], sees pulm.

## 2024-01-30 NOTE — Assessment & Plan Note (Signed)
 Encourage healthy diet and lifestyle to effect sustainable weight loss. Activity limited by joint pains

## 2024-01-30 NOTE — Assessment & Plan Note (Addendum)
 Occurred remotely 2013 during acute illness/hospitalization. No recurrent symptoms.  Continues keppra .

## 2024-01-31 NOTE — Telephone Encounter (Signed)
 Faxed phone note to number as requested.

## 2024-01-31 NOTE — Telephone Encounter (Unsigned)
 Copied from CRM #[id number]. Topic: General - Other >> Jan 31, 2024  9:43 AM Antwanette L wrote: Shawnee from [facility] is calling to get clarification regarding why the provider is ordering and ultrasound for abdominal pain, specifically insurance purposes. Please call [facility] with this information at [phone] opt [phone], then 5 or fax the information at [phone]

## 2024-02-04 ENCOUNTER — Encounter (HOSPITAL_BASED_OUTPATIENT_CLINIC_OR_DEPARTMENT_OTHER)

## 2024-02-05 DIAGNOSIS — Q909 Down syndrome, unspecified: Secondary | ICD-10-CM | POA: Diagnosis not present

## 2024-02-11 ENCOUNTER — Ambulatory Visit (INDEPENDENT_AMBULATORY_CARE_PROVIDER_SITE_OTHER)

## 2024-02-11 ENCOUNTER — Other Ambulatory Visit

## 2024-02-11 ENCOUNTER — Encounter (HOSPITAL_BASED_OUTPATIENT_CLINIC_OR_DEPARTMENT_OTHER): Payer: Self-pay

## 2024-02-11 VITALS — BP 111/70 | HR 72 | Ht <= 58 in | Wt 242.0 lb

## 2024-02-11 DIAGNOSIS — G4733 Obstructive sleep apnea (adult) (pediatric): Secondary | ICD-10-CM

## 2024-02-11 NOTE — Patient Instructions (Signed)
 Continue CPAP usage nightly.  Follow up in one year.

## 2024-02-11 NOTE — Progress Notes (Signed)
 @Patient  ID: [name], female    DOB: 01/28/1971, 53 y.o.   MRN: [id number]  Chief Complaint  Patient presents with   Establish Care    Transfer of care     Referring provider: Rilla Baller, MD  HPI: Discussed the use of AI scribe software for clinical note transcription with the patient, who gave verbal consent to proceed.  History of Present Illness [name] is a 53 year old female with obstructive sleep apnea who presents for routine follow-up regarding CPAP compliance.  She demonstrates good compliance with her CPAP machine, using it every night. She has an acceptable leak profile and residual AHI 2.3/hr.  She offers no c/o today and denies fever, chills, cough, shortness of breath, chest pain, or weight changes.  Last OV 08/14/2022: [name] is a 53 y.o. female with severe obstructive sleep apnea.  She has hx of pneumonia and empyema with respiratory failure and prior tracheostomy.  She is here with her father.   I last saw her in May 2021.   Uses CPAP nightly.  No issue with pressure.  Straps from mask sometimes feel tight around the side of her head.  TEST/EVENTS :   PSG 06/02/12 >> AHI 118.3, SpO2 low 67% ONO with CPAP and 1 liter 07/18/12 >> Test time 7 hrs 44 min.  Basal SpO2 96%, SpO2 low 81%.  Spent 0.7% of time with SpO2 < 88%. ONO with CPAP 08/18/17 >> test tim 6 hrs 53 min.  Baseline SpO2 96%, low SpO2 82%.  Spent 5 mins 44 sec with SpO2 < 88%. Auto CPAP 05/16/22 to 08/13/22 >> used on 90 of 90 nights with average 5 hrs 56 min.  Average AHI 1 with median CPAP 11 and 95 th percentile CPAP 12 cm H2O  Allergies[1]  Immunization History  Administered Date(s) Administered   Influenza Split 12/05/2011   Influenza, Mdck, Trivalent,PF 6+ MOS(egg free) 01/12/2023   Influenza, Seasonal, Injecte, Preservative Fre 02/13/2013, 01/29/2024   Influenza,inj,Quad PF,6+ Mos 12/04/2013, 11/19/2014, 11/17/2015, 12/14/2016, 02/02/2018, 10/28/2018, 11/30/2020,  01/17/2022   PFIZER(Purple Top)SARS-COV-2 Vaccination 05/29/2019, 06/19/2019, 03/01/2020   PNEUMOCOCCAL CONJUGATE-20 01/29/2024   Pfizer Covid-19 Vaccine Bivalent Booster 70yrs & up 11/30/2020   Pneumococcal Polysaccharide-23 12/05/2011   Tdap 02/13/2013   Zoster Recombinant(Shingrix) 01/12/2023, 03/23/2023    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 [facility])    neurologist--- dr rosemarie;   secondary to down syndrome (Trisomy 21-related),  nl CT head 2010, did not tolerate aricept   Down syndrome    Eczema    Edema of both lower extremities    Endometrial hyperplasia    H/O pleural empyema 09/24/2011   acute  in setting CAP /   s/p left  VATS with drainage empyema,  post op acute respiratory failure --- resolved   History of DVT of lower extremity 05/11/2021   dx left lower extremity popliteal vein,  completed 3 months eliquis  then changed to ASA 81mg    History of recurrent pneumonia    multiple pneumonias   Hypothyroidism    MDD (major depressive disorder)    OA (osteoarthritis)    bilat knee arthritis, had 3/3 Supartz injections [facility])  OCD (obsessive compulsive disorder)    OSA on CPAP 05/2012   folowed by dr shellia---- study in epic, severe OSA with AHI 118, desat nadir to 67%   (per pt mother pt uses nightly)   Pre-diabetes    Seizure disorder [facility]) 2013   neurologist--- dr rosemarie;   (10-28-2021  per pt mother no seizure since 2013)    long hospitalization, x 2 ; very mild; during hospitalization while coming off vent   Varicose veins of both lower extremities    followed by dr j. dew ---   with venous reflux,   10-25-2021  s/p laser procedure left leg GSV and SF   Wears glasses     Tobacco  History: Tobacco Use History[2] Counseling given: Not Answered   Outpatient Medications Prior to Visit  Medication Sig Dispense Refill   allopurinol  (ZYLOPRIM ) 100 MG tablet Take 1 tablet (100 mg total) by mouth at bedtime. 90 tablet 3   aspirin  EC 81 MG tablet Take 1 tablet (81 mg total) by mouth daily. Swallow whole.     clobetasol  cream (TEMOVATE ) 0.05 % APPLY TO SKIN RASH 2 TIMES DAILY AS NEEDED. MAX 2 WEEKS AT A TIME, AVOID FACE, UNDERARMS, GROIN. 45 g 0   famotidine  (PEPCID ) 20 MG tablet Take 1 tablet (20 mg total) by mouth 2 (two) times daily. 180 tablet 1   Ibuprofen  200 MG CAPS Take 3 capsules (600 mg total) by mouth 2 (two) times daily as needed.     levETIRAcetam  (KEPPRA ) 500 MG tablet Take 1 tablet (500 mg total) by mouth 2 (two) times daily. 180 tablet 3   levothyroxine  (SYNTHROID ) 125 MCG tablet Take 1 tablet (125 mcg total) by mouth daily before breakfast. 90 tablet 3   memantine  (NAMENDA ) 10 MG tablet Take 1 tablet (10 mg total) by mouth 2 (two) times daily. 180 tablet 3   Multiple Vitamins-Minerals (WOMENS MULTI PO) Take 1 tablet by mouth at bedtime.     polyethylene glycol powder (GLYCOLAX /MIRALAX ) 17 GM/SCOOP powder Take 8.5-17 g by mouth daily. Hold for diarrhea. Dissolve 1 capful (17g) in 4-8 ounces of liquid and take by mouth daily. 3350 g 0   traMADol  (ULTRAM ) 50 MG tablet Take 1 tablet (50 mg total) by mouth 2 (two) times daily as needed for moderate pain (pain score 4-6). 30 tablet 0   No facility-administered medications prior to visit.     Review of Systems: as per HPI  Constitutional:   No  weight loss, night sweats,  Fevers, chills, fatigue, or  lassitude.  HEENT:   No headaches,  Difficulty swallowing,  Tooth/dental problems, or  Sore throat,                No sneezing, itching, ear ache, nasal congestion, post nasal drip,   CV:  No chest pain,  Orthopnea, PND, swelling in lower extremities, anasarca, dizziness, palpitations, syncope.   GI  No heartburn,  indigestion, abdominal pain, nausea, vomiting, diarrhea, change in bowel habits, loss of appetite, bloody stools.   Resp: No shortness of breath with exertion or at rest.  No excess mucus, no productive cough,  No non-productive cough,  No coughing up of blood.  No change in color of mucus.  No wheezing.  No chest wall deformity  Skin: no rash or lesions.  GU: no dysuria, change in color of urine, no urgency or frequency.  No flank pain, no hematuria   MS:  No joint pain or swelling.  No decreased range  of motion.  No back pain.    Physical Exam  BP 111/70   Pulse 72   Ht 4' 8 (1.422 m)   Wt 242 lb (109.8 kg)   SpO2 96%   BMI 54.26 kg/m   GEN: A/Ox3; pleasant , NAD, well nourished    HEENT:  [place]/AT,  EACs-clear, TMs-wnl, NOSE-clear, THROAT-clear, no lesions, no postnasal drip or exudate noted.   NECK:  Supple w/ fair ROM; no JVD; normal carotid impulses w/o bruits; no thyromegaly or nodules palpated; no lymphadenopathy.    RESP  Clear  P & A; w/o, wheezes/ rales/ or rhonchi. no accessory muscle use, no dullness to percussion  CARD:  RRR, no m/r/g, no peripheral edema, pulses intact, no cyanosis or clubbing.  GI:   obese, soft & nt; nml bowel sounds; no organomegaly or masses detected.   Musco: Warm bil, no deformities or joint swelling noted.   Neuro: alert, no focal deficits noted.    Skin: Warm, no lesions or rashes    Lab Results:  CBC    Component Value Date/Time   WBC 5.6 11/28/2023 1504   RBC 3.97 11/28/2023 1504   HGB 13.9 11/28/2023 1504   HCT 42.0 11/28/2023 1504   PLT 205.0 11/28/2023 1504   MCV 105.8 (H) 11/28/2023 1504   MCH 34.9 (H) 01/24/2023 1528   MCHC 33.2 11/28/2023 1504   RDW 14.7 11/28/2023 1504   LYMPHSABS 1.3 11/28/2023 1504   MONOABS 0.5 11/28/2023 1504   EOSABS 0.1 11/28/2023 1504   BASOSABS 0.1 11/28/2023 1504    BMET    Component Value Date/Time   NA 139 11/28/2023 1504   K 4.6 11/28/2023 1504   CL 101 11/28/2023 1504   CO2  29 11/28/2023 1504   GLUCOSE 109 (H) 11/28/2023 1504   BUN 16 11/28/2023 1504   CREATININE 1.12 11/28/2023 1504   CREATININE 1.08 (H) 01/24/2023 1528   CALCIUM  9.4 11/28/2023 1504   GFRNONAA >60 10/06/2019 1655   GFRAA >60 10/06/2019 1655    BNP No results found for: BNP  ProBNP    Component Value Date/Time   PROBNP 486.8 (H) 10/11/2011 0525    Imaging: No results found.  Administration History     None           No data to display          No results found for: NITRICOXIDE   Assessment & Plan:   Assessment & Plan OSA (obstructive sleep apnea)  Assessment and Plan Assessment & Plan Obstructive sleep apnea Well-managed with good CPAP compliance, acceptable leak profile and residual AHI 2.3/hr. - Continue CPAP therapy nightly.  Goal of at least 4-6 hours of usage nightly. - Schedule follow-up in one year. - Schedule follow-up appointment sooner if issues arise.    Return in about 1 year (around 02/10/2025) for compliance download.SABRA Candis Dandy, PA-C 02/11/2024      [1]  Allergies Allergen Reactions   Sertraline  Other (See Comments)    Worsened mood, irritable   [2]  Social History Tobacco Use  Smoking Status Never  Smokeless Tobacco Never

## 2024-02-24 ENCOUNTER — Other Ambulatory Visit: Payer: Self-pay | Admitting: Adult Health

## 2024-03-04 ENCOUNTER — Telehealth: Payer: Self-pay | Admitting: Adult Health

## 2024-03-04 NOTE — Telephone Encounter (Signed)
 MY[facility] conf

## 2024-03-05 ENCOUNTER — Other Ambulatory Visit (INDEPENDENT_AMBULATORY_CARE_PROVIDER_SITE_OTHER)

## 2024-03-05 DIAGNOSIS — R1084 Generalized abdominal pain: Secondary | ICD-10-CM

## 2024-03-05 DIAGNOSIS — M1A0620 Idiopathic chronic gout, left knee, without tophus (tophi): Secondary | ICD-10-CM | POA: Diagnosis not present

## 2024-03-05 DIAGNOSIS — R7303 Prediabetes: Secondary | ICD-10-CM

## 2024-03-05 DIAGNOSIS — E039 Hypothyroidism, unspecified: Secondary | ICD-10-CM

## 2024-03-05 DIAGNOSIS — Z6841 Body Mass Index (BMI) 40.0 and over, adult: Secondary | ICD-10-CM

## 2024-03-05 LAB — CBC WITH DIFFERENTIAL/PLATELET
Basophils Absolute: 0.1 K/uL (ref 0.0–0.1)
Basophils Relative: 2.3 % (ref 0.0–3.0)
Eosinophils Absolute: 0.1 K/uL (ref 0.0–0.7)
Eosinophils Relative: 2.4 % (ref 0.0–5.0)
HCT: 43.4 % (ref 36.0–46.0)
Hemoglobin: 14.4 g/dL (ref 12.0–15.0)
Lymphocytes Relative: 26.2 % (ref 12.0–46.0)
Lymphs Abs: 1.3 K/uL (ref 0.7–4.0)
MCHC: 33.2 g/dL (ref 30.0–36.0)
MCV: 105.7 fl — ABNORMAL HIGH (ref 78.0–100.0)
Monocytes Absolute: 0.5 K/uL (ref 0.1–1.0)
Monocytes Relative: 11.1 % (ref 3.0–12.0)
Neutro Abs: 2.8 K/uL (ref 1.4–7.7)
Neutrophils Relative %: 58 % (ref 43.0–77.0)
Platelets: 203 K/uL (ref 150.0–400.0)
RBC: 4.11 Mil/uL (ref 3.87–5.11)
RDW: 15 % (ref 11.5–15.5)
WBC: 4.9 K/uL (ref 4.0–10.5)

## 2024-03-05 LAB — COMPREHENSIVE METABOLIC PANEL WITH GFR
ALT: 28 U/L (ref 3–35)
AST: 26 U/L (ref 5–37)
Albumin: 3.9 g/dL (ref 3.5–5.2)
Alkaline Phosphatase: 55 U/L (ref 39–117)
BUN: 16 mg/dL (ref 6–23)
CO2: 30 meq/L (ref 19–32)
Calcium: 9 mg/dL (ref 8.4–10.5)
Chloride: 104 meq/L (ref 96–112)
Creatinine, Ser: 1.11 mg/dL (ref 0.40–1.20)
GFR: 56.76 mL/min — ABNORMAL LOW
Glucose, Bld: 107 mg/dL — ABNORMAL HIGH (ref 70–99)
Potassium: 4.7 meq/L (ref 3.5–5.1)
Sodium: 139 meq/L (ref 135–145)
Total Bilirubin: 0.5 mg/dL (ref 0.2–1.2)
Total Protein: 7 g/dL (ref 6.0–8.3)

## 2024-03-05 LAB — LIPID PANEL
Cholesterol: 152 mg/dL (ref 28–200)
HDL: 50.6 mg/dL
LDL Cholesterol: 87 mg/dL (ref 10–99)
NonHDL: 101.28
Total CHOL/HDL Ratio: 3
Triglycerides: 73 mg/dL (ref 10.0–149.0)
VLDL: 14.6 mg/dL (ref 0.0–40.0)

## 2024-03-05 LAB — HEMOGLOBIN A1C: Hgb A1c MFr Bld: 6 % (ref 4.6–6.5)

## 2024-03-05 LAB — LIPASE: Lipase: 24 U/L (ref 11.0–59.0)

## 2024-03-05 LAB — URIC ACID: Uric Acid, Serum: 5.6 mg/dL (ref 2.4–7.0)

## 2024-03-05 LAB — TSH: TSH: 2.22 u[IU]/mL (ref 0.35–5.50)

## 2024-03-06 ENCOUNTER — Telehealth: Payer: Medicare PPO | Admitting: Adult Health

## 2024-03-06 NOTE — Progress Notes (Unsigned)
 "  [facility]  PATIENT: [name] DOB: 02-Mar-1970   REASON FOR VISIT: Follow-up for seizure disorder, [name] and memory loss HISTORY FROM: Patient and mom   Virtual Visit via Video Note  I connected with [name] on 03/06/2024 at  3:00 PM EST by a video enabled telemedicine application and verified that I am speaking with the correct person using two identifiers.  Location: Patient: at home, accompanied by her mother Provider: in office   I discussed the limitations of evaluation and management by telemedicine and the availability of in person appointments. The patient expressed understanding and agreed to proceed.     HISTORY OF PRESENT ILLNESS:   Update 03/06/2024 JM: Patient returns for yearly follow-up via MyChart video visit accompanied by her mother.  Overall has been doing well and denies any recurrent seizure activity.  Reports compliance on Keppra  500 mg twice daily without side effects.  Continues on memantine  for cognitive impairment.  Continues routine follow-up with PCP, pulmonology and vascular surgery.     History provided for reference purposes only Update 03/06/2023 JM: Patient returns for yearly follow-up visit accompanied by her mother via MyChart video visit.  Remains stable, compliant on Keppra  500 mg twice daily, no seizure activity.  Mother notes gradual decline with cognition, on memantine  10 mg twice daily.  No behavioral concerns.  No questions or concerns at this time.  Update 04/06/2022 JM: Patient returns for yearly follow-up accompanied by her mother.  No recent seizure activity, remains on Keppra  500 mg twice daily. Cognition stable since prior visit. Remains on Namenda  10 mg twice daily.  Prior concerns of underlying depression, increased zoloft  dosage but worsened behaviors therefore has since discontinued. Mother believes behaviors have improved since prior visit without intervention.  Routinely follows with  PCP No concerns at this time  Update 03/31/2021 JM: Returns today for overdue seizure follow-up as well as underlying cognitive impairment with history of Down syndrome.  She is accompanied by her mother, [name].  Previously seen approx 9 months ago. Stable from seizure standpoint, no witnessed seizure activity, compliant on Keppra  500 mg twice daily, denies side effects. At prior visit, concern of underlying depression contributing to cognitive decline, started sertraline  25mg  nightly initially with great improvement in regards to depression and stabilizing cognition but over past 2-3 months, has been having increased outbursts, tearfulness and cognitive decline.  Compliant on Namenda  10 mg twice daily, denies side effects.  No further concerns at this time.  Update 07/01/2020 JM: Christine. Sosa returns for yearly follow-up with history of seizure disorder and cognitive impairment with history of Down syndrome.  She is accompanied by her mother, [name] from a seizure standpoint without seizure activity and remains on Keppra  500 mg twice daily without side effects.   Cognition has been gradually declining and over the past year increased behavioral concerns including agitation, outbursts, sundowning, sleep-wake disturbance and slower movements.  She has remained on Namenda  10 mg twice daily tolerating without side effects.  Possible depression as mother will find her upset and crying occasionally.  She continues to live with her mother who is her full-time caregiver.  No new concerns at this time  Update 03/03/2019 JM: [name] is a 54 year old female who is being seen today via virtual visit for 1 year follow-up with history of seizure disorder, Down syndrome and memory loss.  She remains on Keppra  500 mg twice daily tolerating well without recent seizure activity.  Continues on Namenda   10 mg twice daily and memory has been stable.  She also has a history of OSA and continues use of CPAP with ongoing  follow-up by Dr. Shellia.  She is currently prescribed multiple as needed pain medications such as tramadol , naproxen and meloxicam  due to ongoing knee pain unable to undergo knee replacement due to being overweight.  Continues to attempt weight loss but due to limited mobility, this has been difficult.  No further concerns at this time.  UPDATE 12/30/2019CM [name], 54 year old female returns for follow-up with history of seizure disorder Down syndrome and memory loss.  She also has a history of obstructive sleep apnea but her CPAP is followed by Dr. Shellia.  She remains on Keppra  500 twice daily without further seizure activity.  She is on Namenda  10 mg twice daily and her memory is stable according to the mother.  She is having problems with her left knee and needs to have knee replacement however she needs to lose weight prior to the surgery according to mom.  She no longer goes to adult daycare but helps her mom around the house.  No behavior issues..  She returns for reevaluation  UPDATE 12/26/2018CM [name], 54 year old female returns for follow-up with a history of Down syndrome and cognitive decline over several years.  She also has a seizure disorder which is currently well controlled on Keppra  500 mg twice daily.  She remains on Namenda  10 mg twice daily.  She gets little exercise and is overweight.  She continues to go to adult care 5 days a week.  She has a history of obstructive sleep apnea and is on CPAP with additional oxygen at night.  No recent behavior issues.  She has a history of gout.  She had recent ankle surgery for a torn tendon and is wearing a boot on the right foot.  No recent behavior issues.  Returns for reevaluation  UPDATE 12/15/2015 CM [name], 54 year old female returns for followup with her mother. She has a history of Downs syndrome and  cognitive decline over several years as well as seizure disorder. She is currently well controlled on Keppra  and she is on Namenda  without  side effects to either drug. She is overweight, gets little exercise, lives at home. She goes to adult daycare 5 days a week . She has been having a lot of abdominal pain and diarrhea, she has appointment with GI in December.  She occasionally has behavior problems and was recently placed on Zoloft  however it made her irritability worse and she is slowly titrating off this. She needs refills on Keppra  and Namenda  . She returns for reevaluation  Update 10/14/16PS : She returns for follow-up after last visit a year ago. She is a complaint by sister who provides most of the history. She continues to do well and has not had any breakthrough seizures now for a few years. Her memory and cognitive difficulties remain unchanged. She requires close supervision at home and does spend the day at the daycare center. She can feed dress herself and bathe herself. She is never left alone. There have been no issues with agitation, behavior, delusions or hallucinations or safety concerns. Her gait imbalance remained stable and she has had no falls.  HISTORY: [name] is a 50 year year old Caucasian lady with lifelong history of Down`s syndrome and cognitive decline in last several years from dementia who is referred back to see need for a new problem of seizures. She had recent prolonged  hospital stay at [facility] pneumonia with empyema and ventilator dependent respiratory failure. She underwent video assisted thoracotomy for empyma drainage. She required prolonged antibiotics and was eventually required tracheostomy. She had 2 brief episodes of what sound like complex partial seizures one week apart in September 2013. Her mother who is present today was eyewitness to both episodes. She was described as staring and unresponsive not following commands with a distant look on her face lasting a few minutes followed by a period of disorientation and tiredness. In the second episode she  had some clonic activity in both her upper extremities. She was started on Keppra  500 twice a day falling and abnormal EEG on 11/04/2011 and with showed sharp waves in the left occipital parietal region. She was initially off her Namenda  and Prozac  in the hospital stay and had some behavioral agitation but settled down after the medicines were restarted. She has done well since discharge and has not had any more seizures. She seems to be tolerating Keppra  quite well without any side effects. She is finished a course of antibiotics and home physical and occupation therapy. She is back living at home with mom and plans to start attending adult daycare soon. She apparently did not have any brain imaging study done.  03/11/12: [name] returns for followup. She was last seen by Dr. Rosemarie 12/11/11: She had prolonged hospitilization in Sept 2013 and had seizure events. She was started on Keppra  at that time. No further seizure events. MRI of the brain 12/18/11 was normal. EEG was abnormal suggestive of mild focal irritability in right frontal and temporal regions but no definite epileptiform  activity is noted. ROS neg.    REVIEW OF SYSTEMS: Full 14 system review of systems performed and notable only for those listed, all others are neg:  Constitutional: neg  Cardiovascular: neg Ear/Nose/Throat: neg  Skin: neg Eyes: neg Respiratory: neg Gastroitestinal: neg Hematology/Lymphatic: neg  Endocrine: neg Musculoskeletal: BLE edema, pain Allergy/Immunology: neg Neurological: History of seizure disorder, Down syndrome, cognitive impairment with behaviors Psychiatric: depression, anxiety Sleep : neg   ALLERGIES: Allergies  Allergen Reactions   Sertraline  Other (See Comments)    Worsened mood, irritable     HOME MEDICATIONS: Outpatient Medications Prior to Visit  Medication Sig Dispense Refill   allopurinol  (ZYLOPRIM ) 100 MG tablet Take 1 tablet (100 mg total) by mouth at bedtime. 90 tablet 3   aspirin   EC 81 MG tablet Take 1 tablet (81 mg total) by mouth daily. Swallow whole.     clobetasol  cream (TEMOVATE ) 0.05 % APPLY TO SKIN RASH 2 TIMES DAILY AS NEEDED. MAX 2 WEEKS AT A TIME, AVOID FACE, UNDERARMS, GROIN. 45 g 0   famotidine  (PEPCID ) 20 MG tablet Take 1 tablet (20 mg total) by mouth 2 (two) times daily. 180 tablet 1   Ibuprofen  200 MG CAPS Take 3 capsules (600 mg total) by mouth 2 (two) times daily as needed.     levETIRAcetam  (KEPPRA ) 500 MG tablet Take 1 tablet (500 mg total) by mouth 2 (two) times daily. 180 tablet 3   levothyroxine  (SYNTHROID ) 125 MCG tablet Take 1 tablet (125 mcg total) by mouth daily before breakfast. 90 tablet 3   memantine  (NAMENDA ) 10 MG tablet TAKE 1 TABLET BY MOUTH TWICE A DAY 180 tablet 3   Multiple Vitamins-Minerals (WOMENS MULTI PO) Take 1 tablet by mouth at bedtime.     polyethylene glycol powder (GLYCOLAX /MIRALAX ) 17 GM/SCOOP powder Take 8.5-17 g by  mouth daily. Hold for diarrhea. Dissolve 1 capful (17g) in 4-8 ounces of liquid and take by mouth daily. 3350 g 0   traMADol  (ULTRAM ) 50 MG tablet Take 1 tablet (50 mg total) by mouth 2 (two) times daily as needed for moderate pain (pain score 4-6). 30 tablet 0   No facility-administered medications prior to visit.    PAST MEDICAL HISTORY: 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 [facility])    neurologist--- dr rosemarie;   secondary to down syndrome (Trisomy 21-related),  nl CT head 2010, did not tolerate aricept   Down syndrome    Eczema    Edema of both lower extremities    Endometrial hyperplasia    H/O pleural empyema 09/24/2011   acute  in setting CAP /   s/p left  VATS with drainage empyema,  post op acute respiratory failure --- resolved    History of DVT of lower extremity 05/11/2021   dx left lower extremity popliteal vein,  completed 3 months eliquis  then changed to ASA 81mg    History of recurrent pneumonia    multiple pneumonias   Hypothyroidism    MDD (major depressive disorder)    OA (osteoarthritis)    bilat knee arthritis, had 3/3 Supartz injections Debbrah)   OCD (obsessive compulsive disorder)    OSA on CPAP 05/2012   folowed by dr shellia---- study in epic, severe OSA with AHI 118, desat nadir to 67%   (per pt mother pt uses nightly)   Pre-diabetes    Seizure disorder [facility]) 2013   neurologist--- dr rosemarie;   (10-28-2021  per pt mother no seizure since 2013)    long hospitalization, x 2 ; very mild; during hospitalization while coming off vent   Varicose veins of both lower extremities    followed by dr j. dew ---   with venous reflux,   10-25-2021  s/p laser procedure left leg GSV and SF   Wears glasses     PAST SURGICAL HISTORY: Past Surgical History:  Procedure Laterality Date   ACHILLES TENDON REPAIR Right 01/2017   Dr. Floyd [place]   ACHILLES TENDON REPAIR Right 01/2017   ANKLE ARTHROSCOPY Right 11/11/2020   @[facility]  by dr janit;    debridement and stablilzation lateral collateral ligament   ENDOSCOPIC VEIN LASER TREATMENT Left 10/25/2021   by dr j. dew;   left leg GSV and SF for venous reflux   FLEXIBLE BRONCHOSCOPY  09/24/2011   Procedure: FLEXIBLE BRONCHOSCOPY;  Surgeon: Sudie VEAR Laine, MD;  Location: [facility] OR;  Service: Thoracic;  Laterality: N/A;   HYSTEROSCOPY WITH D & C N/A 09/27/2021   Procedure: DILATATION AND CURETTAGE /HYSTEROSCOPY;  Surgeon: Fredirick Glenys RAMAN, MD;  Location: [facility] OR;  Service: Gynecology;  Laterality: N/A;   INTRAUTERINE DEVICE (IUD) INSERTION N/A 11/07/2021   Procedure: INTRAUTERINE DEVICE (IUD) INSERTION;  Surgeon: Fredirick Glenys RAMAN, MD;  Location: [facility];  Service: Gynecology;  Laterality: N/A;   LUMBAR DISC SURGERY  07/18/2004   @[facility]  by dr onetha;    L4--5 disectomy and left L3--4 nerve decompression   RETINAL LASER PROCEDURE Left 09/2022   TRACHEOSTOMY TUBE PLACEMENT  10/11/2011   Procedure:  TRACHEOSTOMY;  Surgeon: Ida Loader, MD;  Location: [facility] OR;  Service: ENT;  Laterality: N/A;   VIDEO ASSISTED THORACOSCOPY (VATS) /THOROCOTOMY/RADIOACTIVE SEED IMPLANT Left 09/24/2011   VATS, (Dr. Quin)    FAMILY HISTORY: Family History  Problem Relation Age of Onset   Arthritis Mother    Hypertension Father    Diabetes Father    Cancer Neg Hx    Coronary artery disease Neg Hx    Stroke Neg Hx     SOCIAL HISTORY: Social History   Socioeconomic History   Marital status: Single    Spouse name: Not on file   Number of children: 0   Years of education: Not on file   Highest education level: Not on file  Occupational History   Not on file  Tobacco Use   Smoking status: Never   Smokeless tobacco: Never  Vaping Use   Vaping status: Never Used  Substance and Sexual Activity   Alcohol use: Never   Drug use: Never   Sexual activity: Never    Birth control/protection: None  Other Topics Concern   Not on file  Social History Narrative   Lives with mom and dad, dogs   Stays at daycare during the day   Activity: walking some   Diet: good water, fruits/vegetables daily   Social Drivers of Health   Tobacco Use: Low Risk (02/11/2024)   Patient History    Smoking Tobacco Use: Never    Smokeless Tobacco Use: Never    Passive Exposure: Not on file  Financial Resource Strain: Low Risk (10/23/2022)   Overall Financial Resource Strain (CARDIA)    Difficulty of Paying Living Expenses: Not hard at all  Food Insecurity: No Food Insecurity (01/21/2024)   Epic    Worried About Radiation Protection Practitioner of Food in the Last Year: Never true    Ran Out of Food in the Last Year: Never true  Transportation Needs: No Transportation Needs (01/21/2024)   Epic    Lack of Transportation (Medical): No    Lack of Transportation (Non-Medical): No  Physical  Activity: Insufficiently Active (01/21/2024)   Exercise Vital Sign    Days of Exercise per Week: 5 days    Minutes of Exercise per Session: 10 min  Stress: Patient Unable To Answer (01/21/2024)   [employer] of Occupational Health - Occupational Stress Questionnaire    Feeling of Stress: Patient unable to answer  Social Connections: Socially Isolated (01/21/2024)   Social Connection and Isolation Panel    Frequency of Communication with Friends and Family: Never    Frequency of Social Gatherings with Friends and Family: Never    Attends Religious Services: Never    Database Administrator or Organizations: No    Attends Banker Meetings: Never    Marital Status: Never married  Intimate Partner Violence: Patient Unable To Answer (01/21/2024)   Epic    Fear of Current or Ex-Partner: Patient unable to answer    Emotionally Abused: Patient unable to answer    Physically Abused: Patient unable to answer    Sexually Abused: Patient unable to answer  Depression (PHQ2-9): Low Risk (01/21/2024)   Depression (PHQ2-9)    PHQ-2 Score: 2  Recent Concern: Depression (PHQ2-9) - Medium Risk (11/28/2023)   Depression (PHQ2-9)    PHQ-2 Score: 10  Alcohol Screen: Low Risk (10/23/2022)   Alcohol Screen    Last Alcohol Screening Score (AUDIT): 0  Housing: Unknown (01/21/2024)   Epic    Unable to Pay for  Housing in the Last Year: Patient unable to answer    Number of Times Moved in the Last Year: 0    Homeless in the Last Year: Patient unable to answer  Utilities: Not At Risk (01/21/2024)   Epic    Threatened with loss of utilities: No  Health Literacy: Adequate Health Literacy (01/21/2024)   B1300 Health Literacy    Frequency of need for help with medical instructions: Never     PHYSICAL EXAM N/A d/t visit type     DIAGNOSTIC DATA (LABS, IMAGING, TESTING) - I reviewed patient records, labs, notes, testing and imaging myself where available.  Lab Results  Component  Value Date   WBC 4.9 03/05/2024   HGB 14.4 03/05/2024   HCT 43.4 03/05/2024   MCV 105.7 (H) 03/05/2024   PLT 203.0 03/05/2024      Component Value Date/Time   NA 139 03/05/2024 0757   K 4.7 03/05/2024 0757   CL 104 03/05/2024 0757   CO2 30 03/05/2024 0757   GLUCOSE 107 (H) 03/05/2024 0757   BUN 16 03/05/2024 0757   CREATININE 1.11 03/05/2024 0757   CREATININE 1.08 (H) 01/24/2023 1528   CALCIUM  9.0 03/05/2024 0757   PROT 7.0 03/05/2024 0757   ALBUMIN  3.9 03/05/2024 0757   AST 26 03/05/2024 0757   ALT 28 03/05/2024 0757   ALKPHOS 55 03/05/2024 0757   BILITOT 0.5 03/05/2024 0757   GFRNONAA >60 10/06/2019 1655   GFRAA >60 10/06/2019 1655    Lab Results  Component Value Date   HGBA1C 6.0 03/05/2024   Lab Results  Component Value Date   VITAMINB12 686 01/24/2023   Lab Results  Component Value Date   TSH 2.22 03/05/2024      ASSESSMENT AND PLAN 54 y.o. year old female  has a past medical history of Downs syndrome; Depression;  Seizure; and memory loss    1.  Seizure disorder -Stable without seizure activity -Continue Keppra  500 mg twice daily for seizure prophylaxis -refill up to date  2.  Dementia due to Down syndrome -Cognition mildly gradually declined over the past year -Continue Namenda  10 mg twice daily -refill up to date -prior intolerance to Aricept  -currently denies issues with significant behaviors     Follow-up in 1 year via MyChart video visit or call earlier if needed   CC:  Rilla Baller, MD     I spent 15 minutes of face-to-face and non-face-to-face time with patient and mother via MyChart video visit.  This included previsit chart review, lab review, study review, order entry, electronic health record documentation, patient and mother education and discussion re: above diagnoses, treatment options and treatment plan and answered all the questions to patient and mother satisfaction  Harlene Bogaert, [facility]  [facility] [address] [place], [place] [address]  Phone [phone] Fax [phone] Note: This document was prepared with digital dictation and possible smart phrase technology. Any transcriptional errors that result from this process are unintentional. "

## 2024-03-10 ENCOUNTER — Ambulatory Visit: Payer: Self-pay | Admitting: Family Medicine

## 2024-03-10 NOTE — Telephone Encounter (Signed)
 Can we f/u with imaging center or mom [name]?  Abd US  never scheduled.

## 2024-03-17 ENCOUNTER — Ambulatory Visit: Admitting: Podiatry

## 2024-03-20 ENCOUNTER — Other Ambulatory Visit: Payer: Self-pay | Admitting: Adult Health

## 2024-03-20 ENCOUNTER — Telehealth: Admitting: Adult Health

## 2024-03-20 ENCOUNTER — Encounter: Payer: Self-pay | Admitting: Adult Health

## 2024-03-20 DIAGNOSIS — G40909 Epilepsy, unspecified, not intractable, without status epilepticus: Secondary | ICD-10-CM | POA: Diagnosis not present

## 2024-03-20 DIAGNOSIS — Q909 Down syndrome, unspecified: Secondary | ICD-10-CM | POA: Diagnosis not present

## 2024-03-20 DIAGNOSIS — F0280 Dementia in other diseases classified elsewhere without behavioral disturbance: Secondary | ICD-10-CM

## 2024-03-20 DIAGNOSIS — R569 Unspecified convulsions: Secondary | ICD-10-CM

## 2024-03-20 MED ORDER — LEVETIRACETAM 500 MG PO TABS: 500.0000 mg | ORAL_TABLET | Freq: Two times a day (BID) | ORAL | 3 refills | Status: AC

## 2024-03-20 NOTE — Progress Notes (Signed)
 "  [facility]  PATIENT: [name] DOB: [birth year]   REASON FOR VISIT: Follow-up for seizure disorder, [name] and memory loss HISTORY FROM: Patient and mom   Virtual Visit via Video Note  I connected with [name] on 03/20/24 at  3:30 PM EST by a video enabled telemedicine application and verified that I am speaking with the correct person using two identifiers.  Location: Patient: at home, accompanied by her mother Provider: in office   I discussed the limitations of evaluation and management by telemedicine and the availability of in person appointments. The patient expressed understanding and agreed to proceed.     HISTORY OF PRESENT ILLNESS:   Update 03/20/2024 JM: Patient returns for yearly follow-up via MyChart video visit accompanied by her mother.  Overall has been doing well and denies any recurrent seizure activity.  Reports compliance on Keppra  500 mg twice daily without side effects.  Continues on memantine  for cognitive impairment although mother reports continued gradual decline over the past year.  She wishes to continue with current treatment plan.  Continues routine follow-up with PCP, pulmonology and vascular surgery.  Recent lab work by PCP was satisfactory.    History provided for reference purposes only Update 03/06/2023 JM: Patient returns for yearly follow-up visit accompanied by her mother via MyChart video visit.  Remains stable, compliant on Keppra  500 mg twice daily, no seizure activity.  Mother notes gradual decline with cognition, on memantine  10 mg twice daily.  No behavioral concerns.  No questions or concerns at this time.  Update 04/06/2022 JM: Patient returns for yearly follow-up accompanied by her mother.  No recent seizure activity, remains on Keppra  500 mg twice daily. Cognition stable since prior visit. Remains on Namenda  10 mg twice daily.  Prior concerns of underlying depression, increased zoloft  dosage but  worsened behaviors therefore has since discontinued. Mother believes behaviors have improved since prior visit without intervention.  Routinely follows with PCP No concerns at this time  Update 03/31/2021 JM: Returns today for overdue seizure follow-up as well as underlying cognitive impairment with history of Down syndrome.  She is accompanied by her mother, [name].  Previously seen approx 9 months ago. Stable from seizure standpoint, no witnessed seizure activity, compliant on Keppra  500 mg twice daily, denies side effects. At prior visit, concern of underlying depression contributing to cognitive decline, started sertraline  25mg  nightly initially with great improvement in regards to depression and stabilizing cognition but over past 2-3 months, has been having increased outbursts, tearfulness and cognitive decline.  Compliant on Namenda  10 mg twice daily, denies side effects.  No further concerns at this time.  Update 07/01/2020 JM: [name] returns for yearly follow-up with history of seizure disorder and cognitive impairment with history of Down syndrome.  She is accompanied by her mother, [name] from a seizure standpoint without seizure activity and remains on Keppra  500 mg twice daily without side effects.   Cognition has been gradually declining and over the past year increased behavioral concerns including agitation, outbursts, sundowning, sleep-wake disturbance and slower movements.  She has remained on Namenda  10 mg twice daily tolerating without side effects.  Possible depression as mother will find her upset and crying occasionally.  She continues to live with her mother who is her full-time caregiver.  No new concerns at this time  Update 03/03/2019 JM: [name] is a 54 year old female who is being seen today via virtual visit for 1 year follow-up with history of  seizure disorder, Down syndrome and memory loss.  She remains on Keppra  500 mg twice daily tolerating well without recent  seizure activity.  Continues on Namenda  10 mg twice daily and memory has been stable.  She also has a history of OSA and continues use of CPAP with ongoing follow-up by Dr. Shellia.  She is currently prescribed multiple as needed pain medications such as tramadol , naproxen and meloxicam  due to ongoing knee pain unable to undergo knee replacement due to being overweight.  Continues to attempt weight loss but due to limited mobility, this has been difficult.  No further concerns at this time.  UPDATE 12/30/2019CM [name], 54 year old female returns for follow-up with history of seizure disorder Down syndrome and memory loss.  She also has a history of obstructive sleep apnea but her CPAP is followed by Dr. Shellia.  She remains on Keppra  500 twice daily without further seizure activity.  She is on Namenda  10 mg twice daily and her memory is stable according to the mother.  She is having problems with her left knee and needs to have knee replacement however she needs to lose weight prior to the surgery according to mom.  She no longer goes to adult daycare but helps her mom around the house.  No behavior issues..  She returns for reevaluation  UPDATE 12/26/2018CM [name], 54 year old female returns for follow-up with a history of Down syndrome and cognitive decline over several years.  She also has a seizure disorder which is currently well controlled on Keppra  500 mg twice daily.  She remains on Namenda  10 mg twice daily.  She gets little exercise and is overweight.  She continues to go to adult care 5 days a week.  She has a history of obstructive sleep apnea and is on CPAP with additional oxygen at night.  No recent behavior issues.  She has a history of gout.  She had recent ankle surgery for a torn tendon and is wearing a boot on the right foot.  No recent behavior issues.  Returns for reevaluation  UPDATE 12/15/2015 CM [name], 54 year old female returns for followup with her mother. She has a history of  Downs syndrome and  cognitive decline over several years as well as seizure disorder. She is currently well controlled on Keppra  and she is on Namenda  without side effects to either drug. She is overweight, gets little exercise, lives at home. She goes to adult daycare 5 days a week . She has been having a lot of abdominal pain and diarrhea, she has appointment with GI in December.  She occasionally has behavior problems and was recently placed on Zoloft  however it made her irritability worse and she is slowly titrating off this. She needs refills on Keppra  and Namenda  . She returns for reevaluation  Update 10/14/16PS : She returns for follow-up after last visit a year ago. She is a complaint by sister who provides most of the history. She continues to do well and has not had any breakthrough seizures now for a few years. Her memory and cognitive difficulties remain unchanged. She requires close supervision at home and does spend the day at the daycare center. She can feed dress herself and bathe herself. She is never left alone. There have been no issues with agitation, behavior, delusions or hallucinations or safety concerns. Her gait imbalance remained stable and she has had no falls.  HISTORY: [name] is a 77 year year old Caucasian lady with lifelong history of Down`s syndrome  and cognitive decline in last several years from dementia who is referred back to see need for a new problem of seizures. She had recent prolonged hospital stay at [facility] pneumonia with empyema and ventilator dependent respiratory failure. She underwent video assisted thoracotomy for empyma drainage. She required prolonged antibiotics and was eventually required tracheostomy. She had 2 brief episodes of what sound like complex partial seizures one week apart in September 2013. Her mother who is present today was eyewitness to both episodes. She was described as staring and  unresponsive not following commands with a distant look on her face lasting a few minutes followed by a period of disorientation and tiredness. In the second episode she had some clonic activity in both her upper extremities. She was started on Keppra  500 twice a day falling and abnormal EEG on 11/04/2011 and with showed sharp waves in the left occipital parietal region. She was initially off her Namenda  and Prozac  in the hospital stay and had some behavioral agitation but settled down after the medicines were restarted. She has done well since discharge and has not had any more seizures. She seems to be tolerating Keppra  quite well without any side effects. She is finished a course of antibiotics and home physical and occupation therapy. She is back living at home with mom and plans to start attending adult daycare soon. She apparently did not have any brain imaging study done.  03/11/12: Ms Pollick returns for followup. She was last seen by Dr. Rosemarie 12/11/11: She had prolonged hospitilization in Sept 2013 and had seizure events. She was started on Keppra  at that time. No further seizure events. MRI of the brain 12/18/11 was normal. EEG was abnormal suggestive of mild focal irritability in right frontal and temporal regions but no definite epileptiform  activity is noted. ROS neg.    REVIEW OF SYSTEMS: Full 14 system review of systems performed and notable only for those listed, all others are neg:  Constitutional: neg  Cardiovascular: neg Ear/Nose/Throat: neg  Skin: neg Eyes: neg Respiratory: neg Gastroitestinal: neg Hematology/Lymphatic: neg  Endocrine: neg Musculoskeletal: BLE edema, pain Allergy/Immunology: neg Neurological: History of seizure disorder, Down syndrome, cognitive impairment with behaviors Psychiatric: depression, anxiety Sleep : neg   ALLERGIES: Allergies  Allergen Reactions   Sertraline  Other (See Comments)    Worsened mood, irritable     HOME MEDICATIONS: Outpatient  Medications Prior to Visit  Medication Sig Dispense Refill   allopurinol  (ZYLOPRIM ) 100 MG tablet Take 1 tablet (100 mg total) by mouth at bedtime. 90 tablet 3   aspirin  EC 81 MG tablet Take 1 tablet (81 mg total) by mouth daily. Swallow whole.     clobetasol  cream (TEMOVATE ) 0.05 % APPLY TO SKIN RASH 2 TIMES DAILY AS NEEDED. MAX 2 WEEKS AT A TIME, AVOID FACE, UNDERARMS, GROIN. 45 g 0   famotidine  (PEPCID ) 20 MG tablet Take 1 tablet (20 mg total) by mouth 2 (two) times daily. 180 tablet 1   Ibuprofen  200 MG CAPS Take 3 capsules (600 mg total) by mouth 2 (two) times daily as needed.     levETIRAcetam  (KEPPRA ) 500 MG tablet Take 1 tablet (500 mg total) by mouth 2 (two) times daily. 180 tablet 3   levothyroxine  (SYNTHROID ) 125 MCG tablet Take 1 tablet (125 mcg total) by mouth daily before breakfast. 90 tablet 3   memantine  (NAMENDA ) 10 MG tablet TAKE 1 TABLET BY MOUTH TWICE A DAY 180 tablet 3   Multiple  Vitamins-Minerals (WOMENS MULTI PO) Take 1 tablet by mouth at bedtime.     polyethylene glycol powder (GLYCOLAX /MIRALAX ) 17 GM/SCOOP powder Take 8.5-17 g by mouth daily. Hold for diarrhea. Dissolve 1 capful (17g) in 4-8 ounces of liquid and take by mouth daily. 3350 g 0   traMADol  (ULTRAM ) 50 MG tablet Take 1 tablet (50 mg total) by mouth 2 (two) times daily as needed for moderate pain (pain score 4-6). 30 tablet 0   No facility-administered medications prior to visit.    PAST MEDICAL HISTORY: 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 [facility])    neurologist--- dr rosemarie;   secondary to down syndrome (Trisomy 21-related),  nl CT head 2010, did not tolerate aricept   Down syndrome    Eczema    Edema of both lower extremities     Endometrial hyperplasia    H/O pleural empyema 09/24/2011   acute  in setting CAP /   s/p left  VATS with drainage empyema,  post op acute respiratory failure --- resolved   History of DVT of lower extremity 05/11/2021   dx left lower extremity popliteal vein,  completed 3 months eliquis  then changed to ASA 81mg    History of recurrent pneumonia    multiple pneumonias   Hypothyroidism    MDD (major depressive disorder)    OA (osteoarthritis)    bilat knee arthritis, had 3/3 Supartz injections Debbrah)   OCD (obsessive compulsive disorder)    OSA on CPAP 05/2012   folowed by dr shellia---- study in epic, severe OSA with AHI 118, desat nadir to 67%   (per pt mother pt uses nightly)   Pre-diabetes    Seizure disorder [facility]) 2013   neurologist--- dr rosemarie;   (10-28-2021  per pt mother no seizure since 2013)    long hospitalization, x 2 ; very mild; during hospitalization while coming off vent   Varicose veins of both lower extremities    followed by dr j. dew ---   with venous reflux,   10-25-2021  s/p laser procedure left leg GSV and SF   Wears glasses     PAST SURGICAL HISTORY: Past Surgical History:  Procedure Laterality Date   ACHILLES TENDON REPAIR Right 01/2017   Dr. Floyd [place]   ACHILLES TENDON REPAIR Right 01/2017   ANKLE ARTHROSCOPY Right 11/11/2020   @[facility]  by dr janit;    debridement and stablilzation lateral collateral ligament   ENDOSCOPIC VEIN LASER TREATMENT Left 10/25/2021   by dr j. dew;   left leg GSV and SF for venous reflux   FLEXIBLE BRONCHOSCOPY  09/24/2011   Procedure: FLEXIBLE BRONCHOSCOPY;  Surgeon: Sudie VEAR Laine, MD;  Location: [facility] OR;  Service: Thoracic;  Laterality: N/A;   HYSTEROSCOPY WITH D & C N/A 09/27/2021   Procedure: DILATATION AND CURETTAGE /HYSTEROSCOPY;  Surgeon: Fredirick Glenys RAMAN, MD;  Location: [facility] OR;  Service: Gynecology;  Laterality: N/A;   INTRAUTERINE DEVICE (IUD) INSERTION N/A 11/07/2021   Procedure: INTRAUTERINE DEVICE (IUD)  INSERTION;  Surgeon: Fredirick Glenys RAMAN, MD;  Location: [facility];  Service: Gynecology;  Laterality: N/A;   LUMBAR DISC SURGERY  07/18/2004   @[facility]  by dr onetha;  L4--5 disectomy and left L3--4 nerve decompression   RETINAL LASER PROCEDURE Left 09/2022   TRACHEOSTOMY TUBE PLACEMENT  10/11/2011   Procedure: TRACHEOSTOMY;  Surgeon: Ida Loader, MD;  Location: [facility] OR;  Service: ENT;  Laterality: N/A;   VIDEO ASSISTED THORACOSCOPY (VATS) /THOROCOTOMY/RADIOACTIVE SEED IMPLANT Left 09/24/2011   VATS, (Dr. Quin)    FAMILY HISTORY: Family History  Problem Relation Age of Onset   Arthritis Mother    Hypertension Father    Diabetes Father    Cancer Neg Hx    Coronary artery disease Neg Hx    Stroke Neg Hx     SOCIAL HISTORY: Social History   Socioeconomic History   Marital status: Single    Spouse name: Not on file   Number of children: 0   Years of education: Not on file   Highest education level: Not on file  Occupational History   Not on file  Tobacco Use   Smoking status: Never   Smokeless tobacco: Never  Vaping Use   Vaping status: Never Used  Substance and Sexual Activity   Alcohol use: Never   Drug use: Never   Sexual activity: Never    Birth control/protection: None  Other Topics Concern   Not on file  Social History Narrative   Lives with mom and dad, dogs   Stays at daycare during the day   Activity: walking some   Diet: good water, fruits/vegetables daily   Social Drivers of Health   Tobacco Use: Low Risk (02/11/2024)   Patient History    Smoking Tobacco Use: Never    Smokeless Tobacco Use: Never    Passive Exposure: Not on file  Financial Resource Strain: Low Risk (10/23/2022)   Overall Financial Resource Strain (CARDIA)    Difficulty of Paying Living Expenses: Not hard at all  Food Insecurity: No Food Insecurity (01/21/2024)   Epic    Worried About Radiation Protection Practitioner of Food in the Last Year: Never true    Ran Out of Food in the Last Year: Never  true  Transportation Needs: No Transportation Needs (01/21/2024)   Epic    Lack of Transportation (Medical): No    Lack of Transportation (Non-Medical): No  Physical Activity: Insufficiently Active (01/21/2024)   Exercise Vital Sign    Days of Exercise per Week: 5 days    Minutes of Exercise per Session: 10 min  Stress: Patient Unable To Answer (01/21/2024)   [employer] of Occupational Health - Occupational Stress Questionnaire    Feeling of Stress: Patient unable to answer  Social Connections: Socially Isolated (01/21/2024)   Social Connection and Isolation Panel    Frequency of Communication with Friends and Family: Never    Frequency of Social Gatherings with Friends and Family: Never    Attends Religious Services: Never    Database Administrator or Organizations: No    Attends Banker Meetings: Never    Marital Status: Never married  Intimate Partner Violence: Patient Unable To Answer (01/21/2024)   Epic    Fear of Current or Ex-Partner: Patient unable to answer    Emotionally Abused: Patient unable to answer    Physically Abused: Patient unable to answer    Sexually Abused: Patient unable to answer  Depression (PHQ2-9): Low Risk (01/21/2024)   Depression (PHQ2-9)    PHQ-2 Score: 2  Recent Concern: Depression (PHQ2-9) - Medium Risk (11/28/2023)   Depression (PHQ2-9)    PHQ-2 Score: 10  Alcohol Screen: Low Risk (10/23/2022)   Alcohol  Screen    Last Alcohol Screening Score (AUDIT): 0  Housing: Unknown (01/21/2024)   Epic    Unable to Pay for Housing in the Last Year: Patient unable to answer    Number of Times Moved in the Last Year: 0    Homeless in the Last Year: Patient unable to answer  Utilities: Not At Risk (01/21/2024)   Epic    Threatened with loss of utilities: No  Health Literacy: Adequate Health Literacy (01/21/2024)   B1300 Health Literacy    Frequency of need for help with medical instructions: Never     PHYSICAL EXAM N/A d/t visit  type     DIAGNOSTIC DATA (LABS, IMAGING, TESTING) - I reviewed patient records, labs, notes, testing and imaging myself where available.  Lab Results  Component Value Date   WBC 4.9 03/05/2024   HGB 14.4 03/05/2024   HCT 43.4 03/05/2024   MCV 105.7 (H) 03/05/2024   PLT 203.0 03/05/2024      Component Value Date/Time   NA 139 03/05/2024 0757   K 4.7 03/05/2024 0757   CL 104 03/05/2024 0757   CO2 30 03/05/2024 0757   GLUCOSE 107 (H) 03/05/2024 0757   BUN 16 03/05/2024 0757   CREATININE 1.11 03/05/2024 0757   CREATININE 1.08 (H) 01/24/2023 1528   CALCIUM  9.0 03/05/2024 0757   PROT 7.0 03/05/2024 0757   ALBUMIN  3.9 03/05/2024 0757   AST 26 03/05/2024 0757   ALT 28 03/05/2024 0757   ALKPHOS 55 03/05/2024 0757   BILITOT 0.5 03/05/2024 0757   GFRNONAA >60 10/06/2019 1655   GFRAA >60 10/06/2019 1655    Lab Results  Component Value Date   HGBA1C 6.0 03/05/2024   Lab Results  Component Value Date   VITAMINB12 686 01/24/2023   Lab Results  Component Value Date   TSH 2.22 03/05/2024      ASSESSMENT AND PLAN 55 y.o. year old female  has a past medical history of Downs syndrome; Depression;  Seizure; and memory loss    1.  Seizure disorder -Stable without seizure activity -Continue Keppra  500 mg twice daily for seizure prophylaxis -refill provided  2.  Dementia due to Down syndrome - Progressive decline -Continue Namenda  10 mg twice daily -refill up to date - mother wishes to continue current regimen  -prior intolerance to Aricept  -currently denies issues with significant behaviors     Follow-up in 1 year via MyChart video visit or call earlier if needed   CC:  Rilla Baller, MD     Harlene Bogaert, [facility]  [facility] [address] [place], [place] [address]  Phone [phone] Fax [phone] Note: This document was prepared with digital dictation and possible smart phrase technology. Any transcriptional  errors that result from this process are unintentional. "

## 2024-03-25 ENCOUNTER — Ambulatory Visit: Admitting: Podiatry

## 2024-03-25 ENCOUNTER — Encounter: Payer: Self-pay | Admitting: Podiatry

## 2024-03-25 DIAGNOSIS — I890 Lymphedema, not elsewhere classified: Secondary | ICD-10-CM | POA: Diagnosis not present

## 2024-03-25 DIAGNOSIS — B351 Tinea unguium: Secondary | ICD-10-CM

## 2024-03-25 DIAGNOSIS — M79675 Pain in left toe(s): Secondary | ICD-10-CM | POA: Diagnosis not present

## 2024-03-25 DIAGNOSIS — M79674 Pain in right toe(s): Secondary | ICD-10-CM | POA: Diagnosis not present

## 2024-03-25 NOTE — Progress Notes (Signed)
 This patient returns to my office for at risk foot care.  This patient requires this care by a professional since this patient will be at risk due to having venous stasis and diabetes.  This patient is unable to cut nails herself since the patient cannot reach her nails.These nails are painful walking and wearing shoes.  This patient presents for at risk foot care today.  General Appearance  Alert, conversant and in no acute stress.  Vascular  Dorsalis pedis and posterior tibial  pulses are not palpable due to swelling. bilaterally.  Capillary return is within normal limits  bilaterally. Temperature is within normal limits  bilaterally. Venous stasis legs  B/l.  Neurologic  Senn-Weinstein monofilament wire test within normal limits  bilaterally. Muscle power within normal limits bilaterally.  Nails Thick disfigured discolored nails with subungual debris  from hallux to fifth toes bilaterally. No evidence of bacterial infection or drainage bilaterally.  Orthopedic  No limitations of motion  feet .  No crepitus or effusions noted.  No bony pathology or digital deformities noted.  Skin  normotropic skin with no porokeratosis noted bilaterally.  No signs of infections or ulcers noted.     Onychomycosis  Pain in right toes  Pain in left toes  Consent was obtained for treatment procedures.   Mechanical debridement of nails 1-5  bilaterally performed with a nail nipper.  Filed with dremel without incident.    Return office visit   3  months                   Told patient to return for periodic foot care and evaluation due to potential at risk complications.   Helane Gunther DPM

## 2024-04-22 ENCOUNTER — Ambulatory Visit: Admitting: Family Medicine

## 2024-04-28 ENCOUNTER — Ambulatory Visit: Admitting: Family Medicine

## 2024-05-21 ENCOUNTER — Ambulatory Visit: Admitting: Podiatry

## 2024-06-23 ENCOUNTER — Ambulatory Visit: Admitting: Podiatry

## 2024-12-03 ENCOUNTER — Encounter (INDEPENDENT_AMBULATORY_CARE_PROVIDER_SITE_OTHER): Admitting: Ophthalmology

## 2024-12-09 ENCOUNTER — Ambulatory Visit (INDEPENDENT_AMBULATORY_CARE_PROVIDER_SITE_OTHER): Admitting: Vascular Surgery

## 2025-03-26 ENCOUNTER — Telehealth: Admitting: Adult Health
